# Patient Record
Sex: Female | Born: 1935 | Race: White | Hispanic: No | Smoking: Never smoker
Health system: Southern US, Community
[De-identification: ages and names within clinical notes are randomized; demographics above are authoritative.]

## PROBLEM LIST (undated history)

## (undated) DIAGNOSIS — N259 Disorder resulting from impaired renal tubular function, unspecified: Secondary | ICD-10-CM

## (undated) DIAGNOSIS — M810 Age-related osteoporosis without current pathological fracture: Secondary | ICD-10-CM

## (undated) DIAGNOSIS — R10816 Epigastric abdominal tenderness: Secondary | ICD-10-CM

## (undated) DIAGNOSIS — R55 Syncope and collapse: Secondary | ICD-10-CM

## (undated) DIAGNOSIS — H47029 Hemorrhage in optic nerve sheath, unspecified eye: Principal | ICD-10-CM

## (undated) DIAGNOSIS — M549 Dorsalgia, unspecified: Secondary | ICD-10-CM

## (undated) DIAGNOSIS — J309 Allergic rhinitis, unspecified: Secondary | ICD-10-CM

## (undated) DIAGNOSIS — R5383 Other fatigue: Secondary | ICD-10-CM

## (undated) DIAGNOSIS — I509 Heart failure, unspecified: Secondary | ICD-10-CM

## (undated) DIAGNOSIS — E785 Hyperlipidemia, unspecified: Secondary | ICD-10-CM

## (undated) DIAGNOSIS — E875 Hyperkalemia: Secondary | ICD-10-CM

## (undated) DIAGNOSIS — B0229 Other postherpetic nervous system involvement: Secondary | ICD-10-CM

## (undated) DIAGNOSIS — N200 Calculus of kidney: Secondary | ICD-10-CM

## (undated) DIAGNOSIS — J690 Pneumonitis due to inhalation of food and vomit: Secondary | ICD-10-CM

## (undated) DIAGNOSIS — H918X9 Other specified hearing loss, unspecified ear: Secondary | ICD-10-CM

## (undated) DIAGNOSIS — I219 Acute myocardial infarction, unspecified: Secondary | ICD-10-CM

## (undated) DIAGNOSIS — R1013 Epigastric pain: Secondary | ICD-10-CM

## (undated) DIAGNOSIS — M79672 Pain in left foot: Secondary | ICD-10-CM

## (undated) DIAGNOSIS — I10 Essential (primary) hypertension: Secondary | ICD-10-CM

## (undated) DIAGNOSIS — R5381 Other malaise: Secondary | ICD-10-CM

## (undated) DIAGNOSIS — K219 Gastro-esophageal reflux disease without esophagitis: Secondary | ICD-10-CM

## (undated) DIAGNOSIS — Z87898 Personal history of other specified conditions: Secondary | ICD-10-CM

## (undated) DIAGNOSIS — M1990 Unspecified osteoarthritis, unspecified site: Secondary | ICD-10-CM

## (undated) DIAGNOSIS — R300 Dysuria: Secondary | ICD-10-CM

## (undated) DIAGNOSIS — Z978 Presence of other specified devices: Secondary | ICD-10-CM

## (undated) DIAGNOSIS — I82409 Acute embolism and thrombosis of unspecified deep veins of unspecified lower extremity: Secondary | ICD-10-CM

## (undated) DIAGNOSIS — F329 Major depressive disorder, single episode, unspecified: Secondary | ICD-10-CM

## (undated) DIAGNOSIS — E119 Type 2 diabetes mellitus without complications: Secondary | ICD-10-CM

## (undated) DIAGNOSIS — D509 Iron deficiency anemia, unspecified: Secondary | ICD-10-CM

## (undated) DIAGNOSIS — N281 Cyst of kidney, acquired: Secondary | ICD-10-CM

## (undated) DIAGNOSIS — Z960 Presence of urogenital implants: Secondary | ICD-10-CM

## (undated) DIAGNOSIS — S32000A Wedge compression fracture of unspecified lumbar vertebra, initial encounter for closed fracture: Secondary | ICD-10-CM

## (undated) DIAGNOSIS — L989 Disorder of the skin and subcutaneous tissue, unspecified: Secondary | ICD-10-CM

## (undated) DIAGNOSIS — I5032 Chronic diastolic (congestive) heart failure: Secondary | ICD-10-CM

## (undated) DIAGNOSIS — K5900 Constipation, unspecified: Secondary | ICD-10-CM

## (undated) DIAGNOSIS — I951 Orthostatic hypotension: Secondary | ICD-10-CM

## (undated) DIAGNOSIS — E1140 Type 2 diabetes mellitus with diabetic neuropathy, unspecified: Secondary | ICD-10-CM

## (undated) DIAGNOSIS — Z8489 Family history of other specified conditions: Secondary | ICD-10-CM

## (undated) DIAGNOSIS — Z86718 Personal history of other venous thrombosis and embolism: Secondary | ICD-10-CM

## (undated) DIAGNOSIS — C4331 Malignant melanoma of nose: Secondary | ICD-10-CM

## (undated) DIAGNOSIS — S22000A Wedge compression fracture of unspecified thoracic vertebra, initial encounter for closed fracture: Secondary | ICD-10-CM

## (undated) DIAGNOSIS — E538 Deficiency of other specified B group vitamins: Secondary | ICD-10-CM

## (undated) DIAGNOSIS — I4891 Unspecified atrial fibrillation: Secondary | ICD-10-CM

## (undated) DIAGNOSIS — H01009 Unspecified blepharitis unspecified eye, unspecified eyelid: Secondary | ICD-10-CM

## (undated) HISTORY — DX: Age-related osteoporosis without current pathological fracture: M81.0

## (undated) HISTORY — DX: Pain in left foot: M79.672

## (undated) HISTORY — DX: Iron deficiency anemia, unspecified: D50.9

## (undated) HISTORY — DX: Gastro-esophageal reflux disease without esophagitis: K21.9

## (undated) HISTORY — DX: Unspecified blepharitis unspecified eye, unspecified eyelid: H01.009

## (undated) HISTORY — DX: Allergic rhinitis, unspecified: J30.9

## (undated) HISTORY — DX: Other malaise: R53.81

## (undated) HISTORY — DX: Syncope and collapse: R55

## (undated) HISTORY — DX: Personal history of other specified conditions: Z87.898

## (undated) HISTORY — DX: Epigastric abdominal tenderness: R10.816

## (undated) HISTORY — DX: Type 2 diabetes mellitus with diabetic neuropathy, unspecified: E11.40

## (undated) HISTORY — DX: Major depressive disorder, single episode, unspecified: F32.9

## (undated) HISTORY — DX: Type 2 diabetes mellitus without complications: E11.9

## (undated) HISTORY — DX: Constipation, unspecified: K59.00

## (undated) HISTORY — PX: CATARACT EXTRACTION W/ INTRAOCULAR LENS  IMPLANT, BILATERAL: SHX1307

## (undated) HISTORY — DX: Disorder resulting from impaired renal tubular function, unspecified: N25.9

## (undated) HISTORY — DX: Hyperlipidemia, unspecified: E78.5

## (undated) HISTORY — DX: Deficiency of other specified B group vitamins: E53.8

## (undated) HISTORY — DX: Chronic diastolic (congestive) heart failure: I50.32

## (undated) HISTORY — DX: Dorsalgia, unspecified: M54.9

## (undated) HISTORY — DX: Essential (primary) hypertension: I10

## (undated) HISTORY — PX: ESOPHAGEAL DILATION: SHX303

## (undated) HISTORY — DX: Dysuria: R30.0

## (undated) HISTORY — DX: Cyst of kidney, acquired: N28.1

## (undated) HISTORY — DX: Orthostatic hypotension: I95.1

## (undated) HISTORY — DX: Disorder of the skin and subcutaneous tissue, unspecified: L98.9

## (undated) HISTORY — DX: Hemorrhage in optic nerve sheath, unspecified eye: H47.029

## (undated) HISTORY — DX: Personal history of other venous thrombosis and embolism: Z86.718

## (undated) HISTORY — DX: Other fatigue: R53.83

## (undated) HISTORY — DX: Epigastric pain: R10.13

## (undated) HISTORY — DX: Unspecified atrial fibrillation: I48.91

## (undated) HISTORY — DX: Other postherpetic nervous system involvement: B02.29

## (undated) HISTORY — DX: Hyperkalemia: E87.5

## (undated) HISTORY — DX: Other specified hearing loss, unspecified ear: H91.8X9

---
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
## 1962-09-01 HISTORY — PX: ABDOMINAL HYSTERECTOMY: SHX81

## 1997-09-01 HISTORY — PX: CARDIAC ELECTROPHYSIOLOGY MAPPING AND ABLATION: SHX1292

## 1998-05-27 ENCOUNTER — Inpatient Hospital Stay (HOSPITAL_COMMUNITY): Admission: EM | Admit: 1998-05-27 | Discharge: 1998-05-28 | Payer: Self-pay | Admitting: Emergency Medicine

## 1998-06-13 ENCOUNTER — Inpatient Hospital Stay (HOSPITAL_COMMUNITY): Admission: AD | Admit: 1998-06-13 | Discharge: 1998-06-14 | Payer: Self-pay | Admitting: Internal Medicine

## 1998-06-15 ENCOUNTER — Inpatient Hospital Stay (HOSPITAL_COMMUNITY): Admission: EM | Admit: 1998-06-15 | Discharge: 1998-06-21 | Payer: Self-pay | Admitting: Emergency Medicine

## 1998-06-15 ENCOUNTER — Encounter: Payer: Self-pay | Admitting: Dermatology

## 1998-11-13 ENCOUNTER — Ambulatory Visit (HOSPITAL_COMMUNITY): Admission: RE | Admit: 1998-11-13 | Discharge: 1998-11-13 | Payer: Self-pay | Admitting: Internal Medicine

## 1998-11-13 ENCOUNTER — Encounter: Payer: Self-pay | Admitting: Internal Medicine

## 2001-06-02 ENCOUNTER — Encounter: Payer: Self-pay | Admitting: Gastroenterology

## 2001-06-02 ENCOUNTER — Ambulatory Visit (HOSPITAL_COMMUNITY): Admission: RE | Admit: 2001-06-02 | Discharge: 2001-06-02 | Payer: Self-pay | Admitting: Gastroenterology

## 2002-09-01 HISTORY — PX: CARPAL TUNNEL RELEASE: SHX101

## 2003-12-30 ENCOUNTER — Emergency Department (HOSPITAL_COMMUNITY): Admission: EM | Admit: 2003-12-30 | Discharge: 2003-12-30 | Payer: Self-pay | Admitting: Family Medicine

## 2004-07-15 ENCOUNTER — Ambulatory Visit: Payer: Self-pay | Admitting: Internal Medicine

## 2004-08-06 ENCOUNTER — Ambulatory Visit: Payer: Self-pay | Admitting: Internal Medicine

## 2004-09-04 ENCOUNTER — Encounter: Admission: RE | Admit: 2004-09-04 | Discharge: 2004-09-04 | Payer: Self-pay | Admitting: Orthopedic Surgery

## 2004-09-05 ENCOUNTER — Ambulatory Visit (HOSPITAL_COMMUNITY): Admission: RE | Admit: 2004-09-05 | Discharge: 2004-09-05 | Payer: Self-pay | Admitting: Orthopedic Surgery

## 2004-09-05 ENCOUNTER — Ambulatory Visit (HOSPITAL_BASED_OUTPATIENT_CLINIC_OR_DEPARTMENT_OTHER): Admission: RE | Admit: 2004-09-05 | Discharge: 2004-09-05 | Payer: Self-pay | Admitting: Orthopedic Surgery

## 2004-09-16 ENCOUNTER — Ambulatory Visit: Payer: Self-pay | Admitting: Internal Medicine

## 2004-10-16 ENCOUNTER — Ambulatory Visit: Payer: Self-pay | Admitting: Internal Medicine

## 2004-11-04 ENCOUNTER — Ambulatory Visit: Payer: Self-pay | Admitting: Internal Medicine

## 2004-11-12 ENCOUNTER — Ambulatory Visit: Payer: Self-pay | Admitting: Endocrinology

## 2004-11-15 ENCOUNTER — Ambulatory Visit (HOSPITAL_COMMUNITY): Admission: RE | Admit: 2004-11-15 | Discharge: 2004-11-15 | Payer: Self-pay | Admitting: Endocrinology

## 2004-11-15 ENCOUNTER — Ambulatory Visit: Payer: Self-pay | Admitting: Internal Medicine

## 2005-01-20 ENCOUNTER — Ambulatory Visit: Payer: Self-pay | Admitting: Internal Medicine

## 2005-02-03 ENCOUNTER — Ambulatory Visit: Payer: Self-pay | Admitting: Internal Medicine

## 2005-02-24 ENCOUNTER — Ambulatory Visit (HOSPITAL_COMMUNITY): Admission: RE | Admit: 2005-02-24 | Discharge: 2005-02-24 | Payer: Self-pay | Admitting: Internal Medicine

## 2005-02-24 ENCOUNTER — Ambulatory Visit: Payer: Self-pay | Admitting: Internal Medicine

## 2005-02-27 ENCOUNTER — Encounter (INDEPENDENT_AMBULATORY_CARE_PROVIDER_SITE_OTHER): Payer: Self-pay | Admitting: *Deleted

## 2005-02-27 ENCOUNTER — Other Ambulatory Visit: Admission: RE | Admit: 2005-02-27 | Discharge: 2005-02-27 | Payer: Self-pay | Admitting: Internal Medicine

## 2005-02-27 ENCOUNTER — Ambulatory Visit: Payer: Self-pay | Admitting: Internal Medicine

## 2005-03-06 ENCOUNTER — Ambulatory Visit: Payer: Self-pay | Admitting: Internal Medicine

## 2005-03-06 ENCOUNTER — Inpatient Hospital Stay (HOSPITAL_COMMUNITY): Admission: AD | Admit: 2005-03-06 | Discharge: 2005-03-10 | Payer: Self-pay | Admitting: Internal Medicine

## 2005-03-13 ENCOUNTER — Ambulatory Visit: Payer: Self-pay | Admitting: Internal Medicine

## 2005-03-18 ENCOUNTER — Inpatient Hospital Stay (HOSPITAL_COMMUNITY): Admission: AD | Admit: 2005-03-18 | Discharge: 2005-03-26 | Payer: Self-pay | Admitting: Internal Medicine

## 2005-03-18 ENCOUNTER — Ambulatory Visit: Payer: Self-pay | Admitting: Internal Medicine

## 2005-03-24 ENCOUNTER — Encounter: Payer: Self-pay | Admitting: Cardiology

## 2005-03-24 ENCOUNTER — Ambulatory Visit: Payer: Self-pay | Admitting: Internal Medicine

## 2005-04-04 ENCOUNTER — Ambulatory Visit: Payer: Self-pay | Admitting: Internal Medicine

## 2005-04-09 ENCOUNTER — Ambulatory Visit: Payer: Self-pay | Admitting: Internal Medicine

## 2005-04-21 ENCOUNTER — Ambulatory Visit: Payer: Self-pay | Admitting: Internal Medicine

## 2005-04-25 ENCOUNTER — Ambulatory Visit: Payer: Self-pay | Admitting: Internal Medicine

## 2005-05-02 ENCOUNTER — Ambulatory Visit: Payer: Self-pay | Admitting: Cardiology

## 2005-05-06 ENCOUNTER — Ambulatory Visit: Payer: Self-pay | Admitting: Internal Medicine

## 2005-05-07 ENCOUNTER — Ambulatory Visit: Payer: Self-pay | Admitting: Internal Medicine

## 2005-05-09 ENCOUNTER — Ambulatory Visit: Payer: Self-pay | Admitting: Cardiology

## 2005-05-11 ENCOUNTER — Encounter: Payer: Self-pay | Admitting: Family Medicine

## 2005-05-11 ENCOUNTER — Inpatient Hospital Stay (HOSPITAL_COMMUNITY): Admission: AD | Admit: 2005-05-11 | Discharge: 2005-05-30 | Payer: Self-pay | Admitting: Critical Care Medicine

## 2005-05-11 ENCOUNTER — Ambulatory Visit: Payer: Self-pay | Admitting: Critical Care Medicine

## 2005-05-12 ENCOUNTER — Encounter (INDEPENDENT_AMBULATORY_CARE_PROVIDER_SITE_OTHER): Payer: Self-pay | Admitting: *Deleted

## 2005-05-14 ENCOUNTER — Ambulatory Visit: Payer: Self-pay | Admitting: Gastroenterology

## 2005-05-15 ENCOUNTER — Ambulatory Visit: Payer: Self-pay | Admitting: Internal Medicine

## 2005-05-16 ENCOUNTER — Encounter (INDEPENDENT_AMBULATORY_CARE_PROVIDER_SITE_OTHER): Payer: Self-pay | Admitting: Specialist

## 2005-05-30 ENCOUNTER — Ambulatory Visit: Payer: Self-pay | Admitting: Internal Medicine

## 2005-06-03 ENCOUNTER — Ambulatory Visit: Payer: Self-pay | Admitting: Internal Medicine

## 2005-06-05 ENCOUNTER — Ambulatory Visit: Payer: Self-pay | Admitting: Internal Medicine

## 2005-06-17 ENCOUNTER — Ambulatory Visit: Payer: Self-pay | Admitting: Internal Medicine

## 2005-06-18 ENCOUNTER — Inpatient Hospital Stay (HOSPITAL_COMMUNITY): Admission: EM | Admit: 2005-06-18 | Discharge: 2005-07-01 | Payer: Self-pay | Admitting: Emergency Medicine

## 2005-06-18 ENCOUNTER — Ambulatory Visit: Payer: Self-pay | Admitting: Endocrinology

## 2005-06-30 ENCOUNTER — Ambulatory Visit: Payer: Self-pay | Admitting: Internal Medicine

## 2005-07-01 ENCOUNTER — Ambulatory Visit: Payer: Self-pay | Admitting: Internal Medicine

## 2005-07-01 ENCOUNTER — Inpatient Hospital Stay: Admission: RE | Admit: 2005-07-01 | Discharge: 2005-07-10 | Payer: Self-pay | Admitting: Internal Medicine

## 2005-07-14 ENCOUNTER — Ambulatory Visit: Payer: Self-pay | Admitting: Cardiology

## 2005-07-17 ENCOUNTER — Ambulatory Visit: Payer: Self-pay | Admitting: Internal Medicine

## 2005-07-21 ENCOUNTER — Ambulatory Visit: Payer: Self-pay | Admitting: Cardiology

## 2005-07-31 ENCOUNTER — Ambulatory Visit: Payer: Self-pay | Admitting: Cardiology

## 2005-08-01 ENCOUNTER — Ambulatory Visit: Payer: Self-pay | Admitting: Internal Medicine

## 2005-08-11 ENCOUNTER — Ambulatory Visit: Payer: Self-pay | Admitting: Cardiology

## 2005-08-14 ENCOUNTER — Ambulatory Visit: Payer: Self-pay | Admitting: Internal Medicine

## 2005-08-20 ENCOUNTER — Ambulatory Visit: Payer: Self-pay | Admitting: Internal Medicine

## 2005-08-28 ENCOUNTER — Ambulatory Visit: Payer: Self-pay | Admitting: *Deleted

## 2005-09-03 ENCOUNTER — Ambulatory Visit: Payer: Self-pay | Admitting: Internal Medicine

## 2005-09-06 ENCOUNTER — Emergency Department (HOSPITAL_COMMUNITY): Admission: EM | Admit: 2005-09-06 | Discharge: 2005-09-06 | Payer: Self-pay | Admitting: Family Medicine

## 2005-09-08 ENCOUNTER — Ambulatory Visit: Admission: RE | Admit: 2005-09-08 | Discharge: 2005-09-08 | Payer: Self-pay | Admitting: Internal Medicine

## 2005-09-08 ENCOUNTER — Ambulatory Visit (HOSPITAL_COMMUNITY): Admission: RE | Admit: 2005-09-08 | Discharge: 2005-09-08 | Payer: Self-pay | Admitting: Internal Medicine

## 2005-09-10 ENCOUNTER — Ambulatory Visit: Payer: Self-pay | Admitting: *Deleted

## 2005-09-15 ENCOUNTER — Ambulatory Visit: Payer: Self-pay | Admitting: Internal Medicine

## 2005-09-29 ENCOUNTER — Ambulatory Visit: Payer: Self-pay | Admitting: Cardiology

## 2005-10-06 ENCOUNTER — Ambulatory Visit: Payer: Self-pay | Admitting: Cardiology

## 2005-10-14 ENCOUNTER — Ambulatory Visit: Payer: Self-pay | Admitting: *Deleted

## 2005-10-21 ENCOUNTER — Ambulatory Visit: Payer: Self-pay | Admitting: Cardiovascular Disease

## 2005-10-29 ENCOUNTER — Ambulatory Visit: Payer: Self-pay | Admitting: Cardiology

## 2005-11-12 ENCOUNTER — Ambulatory Visit: Payer: Self-pay | Admitting: Cardiology

## 2005-11-21 ENCOUNTER — Ambulatory Visit: Payer: Self-pay | Admitting: Internal Medicine

## 2005-11-28 ENCOUNTER — Ambulatory Visit: Payer: Self-pay | Admitting: Cardiology

## 2005-12-12 ENCOUNTER — Ambulatory Visit: Payer: Self-pay | Admitting: Internal Medicine

## 2005-12-22 ENCOUNTER — Ambulatory Visit: Payer: Self-pay | Admitting: Internal Medicine

## 2005-12-26 ENCOUNTER — Ambulatory Visit: Payer: Self-pay | Admitting: Cardiology

## 2006-01-02 ENCOUNTER — Ambulatory Visit: Payer: Self-pay | Admitting: Internal Medicine

## 2006-01-19 ENCOUNTER — Ambulatory Visit: Payer: Self-pay | Admitting: Internal Medicine

## 2006-02-13 ENCOUNTER — Ambulatory Visit: Payer: Self-pay | Admitting: Cardiology

## 2006-02-23 ENCOUNTER — Ambulatory Visit: Payer: Self-pay | Admitting: Internal Medicine

## 2006-03-09 ENCOUNTER — Ambulatory Visit: Payer: Self-pay | Admitting: Internal Medicine

## 2006-03-23 ENCOUNTER — Ambulatory Visit: Payer: Self-pay | Admitting: Internal Medicine

## 2006-04-07 ENCOUNTER — Ambulatory Visit: Payer: Self-pay | Admitting: Cardiology

## 2006-04-27 ENCOUNTER — Ambulatory Visit: Payer: Self-pay | Admitting: Internal Medicine

## 2006-05-06 ENCOUNTER — Ambulatory Visit: Payer: Self-pay | Admitting: Cardiology

## 2006-05-20 ENCOUNTER — Ambulatory Visit: Payer: Self-pay | Admitting: Cardiology

## 2006-05-25 ENCOUNTER — Ambulatory Visit: Payer: Self-pay | Admitting: Internal Medicine

## 2006-05-27 ENCOUNTER — Ambulatory Visit: Payer: Self-pay | Admitting: Internal Medicine

## 2006-06-16 ENCOUNTER — Ambulatory Visit: Payer: Self-pay | Admitting: Cardiology

## 2006-06-23 ENCOUNTER — Ambulatory Visit: Payer: Self-pay | Admitting: Family Medicine

## 2006-06-23 ENCOUNTER — Ambulatory Visit: Payer: Self-pay | Admitting: Internal Medicine

## 2006-06-30 ENCOUNTER — Ambulatory Visit: Payer: Self-pay | Admitting: Cardiology

## 2006-07-15 ENCOUNTER — Ambulatory Visit: Payer: Self-pay | Admitting: Cardiology

## 2006-07-27 ENCOUNTER — Ambulatory Visit: Payer: Self-pay | Admitting: Internal Medicine

## 2006-07-27 ENCOUNTER — Ambulatory Visit: Payer: Self-pay | Admitting: Cardiology

## 2006-08-12 ENCOUNTER — Ambulatory Visit: Payer: Self-pay | Admitting: Internal Medicine

## 2006-08-26 ENCOUNTER — Ambulatory Visit: Payer: Self-pay | Admitting: Internal Medicine

## 2006-09-03 ENCOUNTER — Ambulatory Visit: Payer: Self-pay | Admitting: Cardiology

## 2006-09-24 ENCOUNTER — Ambulatory Visit: Payer: Self-pay | Admitting: Cardiology

## 2006-09-28 ENCOUNTER — Ambulatory Visit: Payer: Self-pay | Admitting: Internal Medicine

## 2006-10-08 ENCOUNTER — Ambulatory Visit: Payer: Self-pay | Admitting: Cardiology

## 2006-10-27 ENCOUNTER — Ambulatory Visit: Payer: Self-pay | Admitting: Cardiology

## 2006-11-17 ENCOUNTER — Ambulatory Visit: Payer: Self-pay | Admitting: Cardiology

## 2006-11-25 ENCOUNTER — Ambulatory Visit: Payer: Self-pay | Admitting: Internal Medicine

## 2006-11-25 LAB — CONVERTED CEMR LAB
Albumin: 3.6 g/dL (ref 3.5–5.2)
Basophils Absolute: 0 10*3/uL (ref 0.0–0.1)
Cholesterol: 112 mg/dL (ref 0–200)
Creatinine, Ser: 1.1 mg/dL (ref 0.4–1.2)
Glucose, Bld: 115 mg/dL — ABNORMAL HIGH (ref 70–99)
HDL: 37.2 mg/dL — ABNORMAL LOW (ref >39.0)
Hemoglobin: 13.6 g/dL (ref 12.0–15.0)
LDL Cholesterol: 61 mg/dL (ref 0–99)
Lymphocytes Relative: 17.1 % (ref 12.0–46.0)
MCHC: 34.1 g/dL (ref 30.0–36.0)
Monocytes Absolute: 0.5 10*3/uL (ref 0.2–0.7)
Monocytes Relative: 8.7 % (ref 3.0–11.0)
Neutrophils Relative %: 72.8 % (ref 43.0–77.0)
Platelets: 182 10*3/uL (ref 150–400)
RBC: 4.74 M/uL (ref 3.87–5.11)
Total Bilirubin: 0.6 mg/dL (ref 0.3–1.2)
Total CHOL/HDL Ratio: 3
Triglycerides: 67 mg/dL (ref 0–149)
VLDL: 13 mg/dL (ref 0–40)
WBC: 6 10*3/uL (ref 4.5–10.5)

## 2006-12-15 ENCOUNTER — Ambulatory Visit: Payer: Self-pay | Admitting: Cardiovascular Disease

## 2006-12-18 ENCOUNTER — Emergency Department (HOSPITAL_COMMUNITY): Admission: EM | Admit: 2006-12-18 | Discharge: 2006-12-18 | Payer: Self-pay | Admitting: Family Medicine

## 2007-01-05 ENCOUNTER — Ambulatory Visit: Payer: Self-pay | Admitting: Internal Medicine

## 2007-01-11 ENCOUNTER — Ambulatory Visit: Payer: Self-pay | Admitting: Cardiology

## 2007-01-22 ENCOUNTER — Ambulatory Visit: Payer: Self-pay | Admitting: Cardiovascular Disease

## 2007-01-26 DIAGNOSIS — I10 Essential (primary) hypertension: Secondary | ICD-10-CM

## 2007-01-26 DIAGNOSIS — D509 Iron deficiency anemia, unspecified: Secondary | ICD-10-CM | POA: Insufficient documentation

## 2007-01-26 DIAGNOSIS — K219 Gastro-esophageal reflux disease without esophagitis: Secondary | ICD-10-CM

## 2007-01-26 HISTORY — DX: Essential (primary) hypertension: I10

## 2007-01-26 HISTORY — DX: Gastro-esophageal reflux disease without esophagitis: K21.9

## 2007-01-26 HISTORY — DX: Iron deficiency anemia, unspecified: D50.9

## 2007-01-27 ENCOUNTER — Ambulatory Visit: Payer: Self-pay | Admitting: Internal Medicine

## 2007-01-28 LAB — HM MAMMOGRAPHY

## 2007-02-10 ENCOUNTER — Ambulatory Visit: Payer: Self-pay | Admitting: Cardiology

## 2007-03-01 ENCOUNTER — Ambulatory Visit: Payer: Self-pay | Admitting: Internal Medicine

## 2007-03-10 ENCOUNTER — Ambulatory Visit: Payer: Self-pay | Admitting: Cardiology

## 2007-04-06 ENCOUNTER — Ambulatory Visit: Payer: Self-pay | Admitting: Cardiology

## 2007-04-06 ENCOUNTER — Ambulatory Visit: Payer: Self-pay | Admitting: Internal Medicine

## 2007-04-06 LAB — CONVERTED CEMR LAB
AST: 18 units/L (ref 0–37)
Bilirubin, Direct: 0.2 mg/dL (ref 0.0–0.3)
CO2: 30 meq/L (ref 19–32)
Calcium: 9.6 mg/dL (ref 8.4–10.5)
Chloride: 106 meq/L (ref 96–112)
Creatinine,U: 44.1 mg/dL
GFR calc Af Amer: 68 mL/min
GFR calc non Af Amer: 56 mL/min
Glucose, Bld: 106 mg/dL — ABNORMAL HIGH (ref 70–99)
HCT: 37.3 % (ref 36.0–46.0)
Hemoglobin: 12.9 g/dL (ref 12.0–15.0)
Hgb A1c MFr Bld: 6.6 % — ABNORMAL HIGH (ref 4.6–6.0)
MCV: 84.5 fL (ref 78.0–100.0)
Microalb Creat Ratio: 22.7 mg/g (ref 0.0–30.0)
Monocytes Absolute: 0.5 10*3/uL (ref 0.2–0.7)
Monocytes Relative: 8.6 % (ref 3.0–11.0)
Neutro Abs: 4.2 10*3/uL (ref 1.4–7.7)
Potassium: 4.5 meq/L (ref 3.5–5.1)
RBC: 4.42 M/uL (ref 3.87–5.11)
RDW: 14.2 % (ref 11.5–14.6)
Specific Gravity, Urine: 1.01 (ref 1.000–1.03)
Total Bilirubin: 0.8 mg/dL (ref 0.3–1.2)
Total CHOL/HDL Ratio: 3.1
Total Protein, Urine: NEGATIVE mg/dL
Total Protein: 6.4 g/dL (ref 6.0–8.3)
Triglycerides: 79 mg/dL (ref 0–149)
Urine Glucose: NEGATIVE mg/dL
VLDL: 16 mg/dL (ref 0–40)
WBC: 6.2 10*3/uL (ref 4.5–10.5)
pH: 6 (ref 5.0–8.0)

## 2007-04-22 ENCOUNTER — Ambulatory Visit: Payer: Self-pay | Admitting: Cardiology

## 2007-04-29 ENCOUNTER — Ambulatory Visit (HOSPITAL_COMMUNITY): Admission: RE | Admit: 2007-04-29 | Discharge: 2007-04-29 | Payer: Self-pay | Admitting: Internal Medicine

## 2007-05-04 ENCOUNTER — Ambulatory Visit: Payer: Self-pay | Admitting: Internal Medicine

## 2007-05-09 IMAGING — CR DG CHEST 1V PORT
1 series · 1 of 1 positions shown · non-contrast
Comparison: Yesterday.

CLINICAL DATA: Followup left pneumothorax

PORTABLE CHEST - 1 VIEW

[view not recorded]
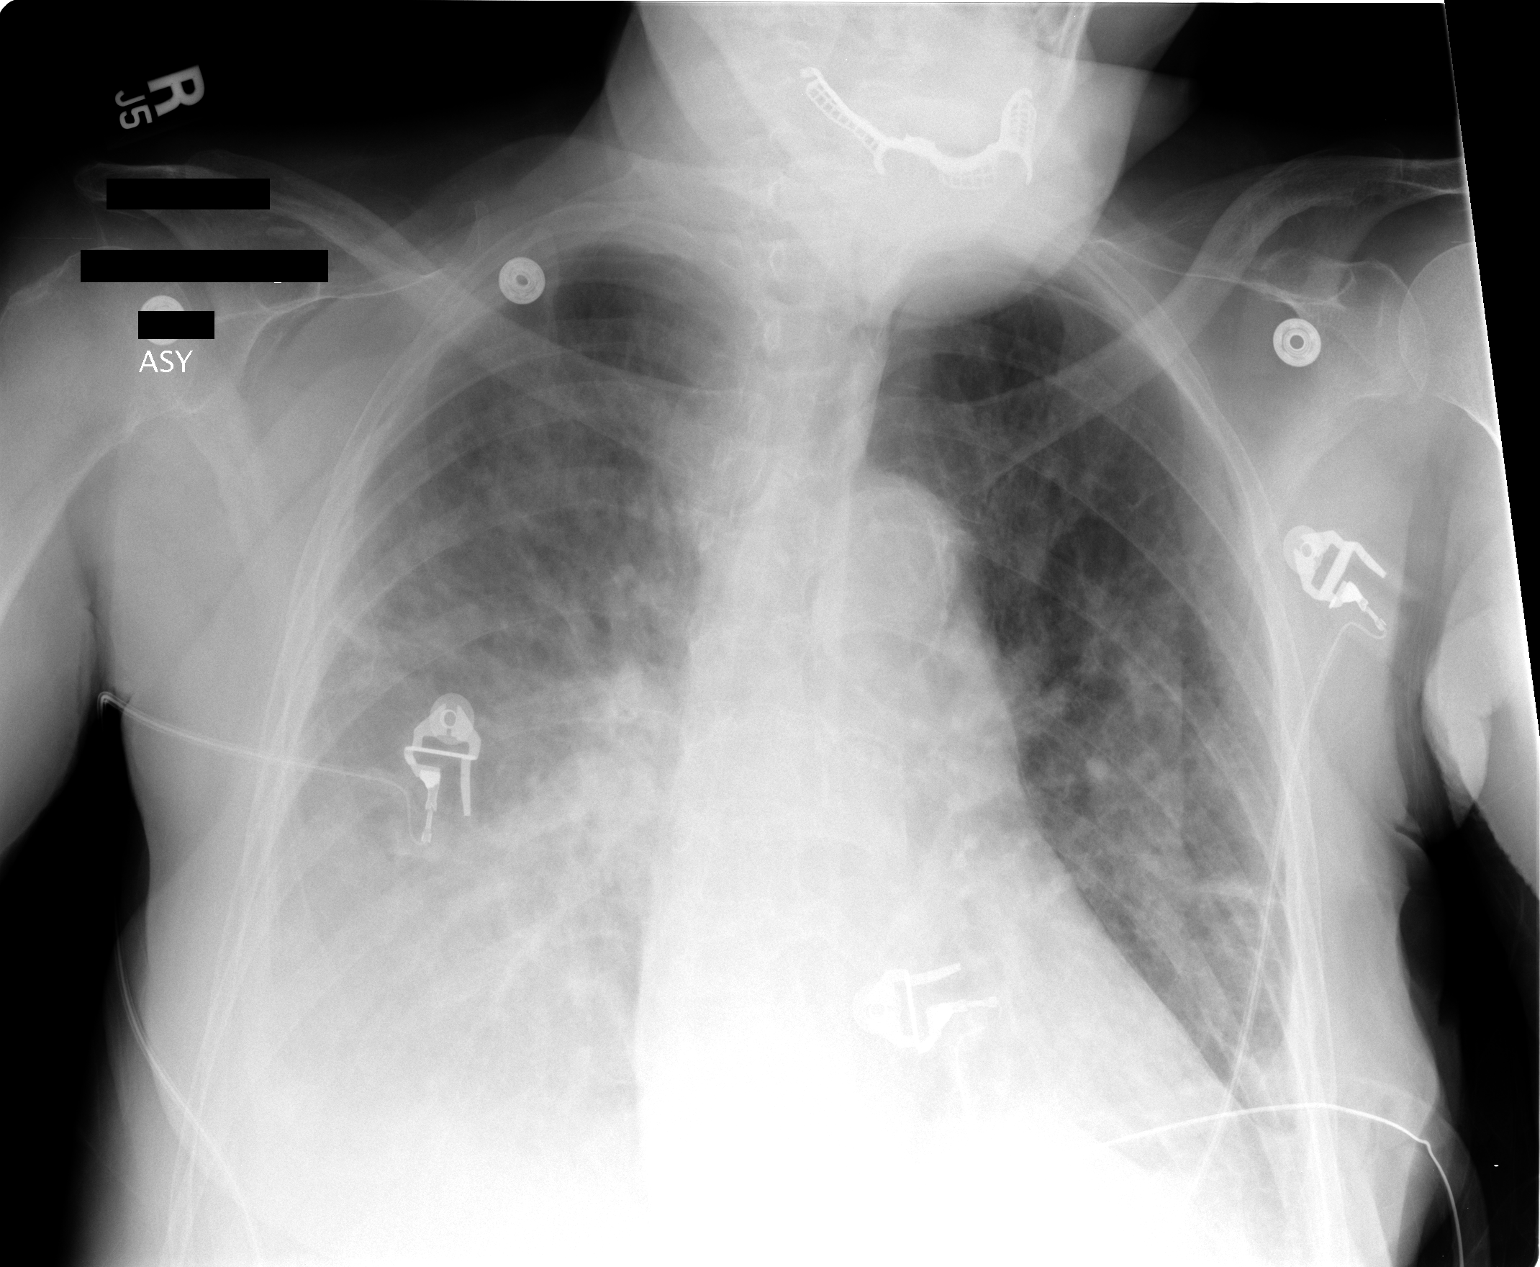

[1 of 1 positions shown; findings below may reference images not displayed]

FINDINGS: No significant change in the 5% left apical pneumothorax. Mildly
increased prominence of the pulmonary vasculature and interstitial markings.
Increased right pleural fluid. Mildly enlarged cardiac silhouette with
improvement.  

IMPRESSION

1. Stable 5% left apical pneumothorax.
2. Worsening congestive heart failure.

## 2007-05-11 ENCOUNTER — Ambulatory Visit: Payer: Self-pay | Admitting: Internal Medicine

## 2007-05-14 IMAGING — CR DG CHEST 2V
2 series · 2 of 2 positions shown · non-contrast
Comparison: 05/27/05.

CLINICAL DATA: Status-post chest tube removal. 
 2-VIEW CHEST:

[view not recorded (1 of 2)]
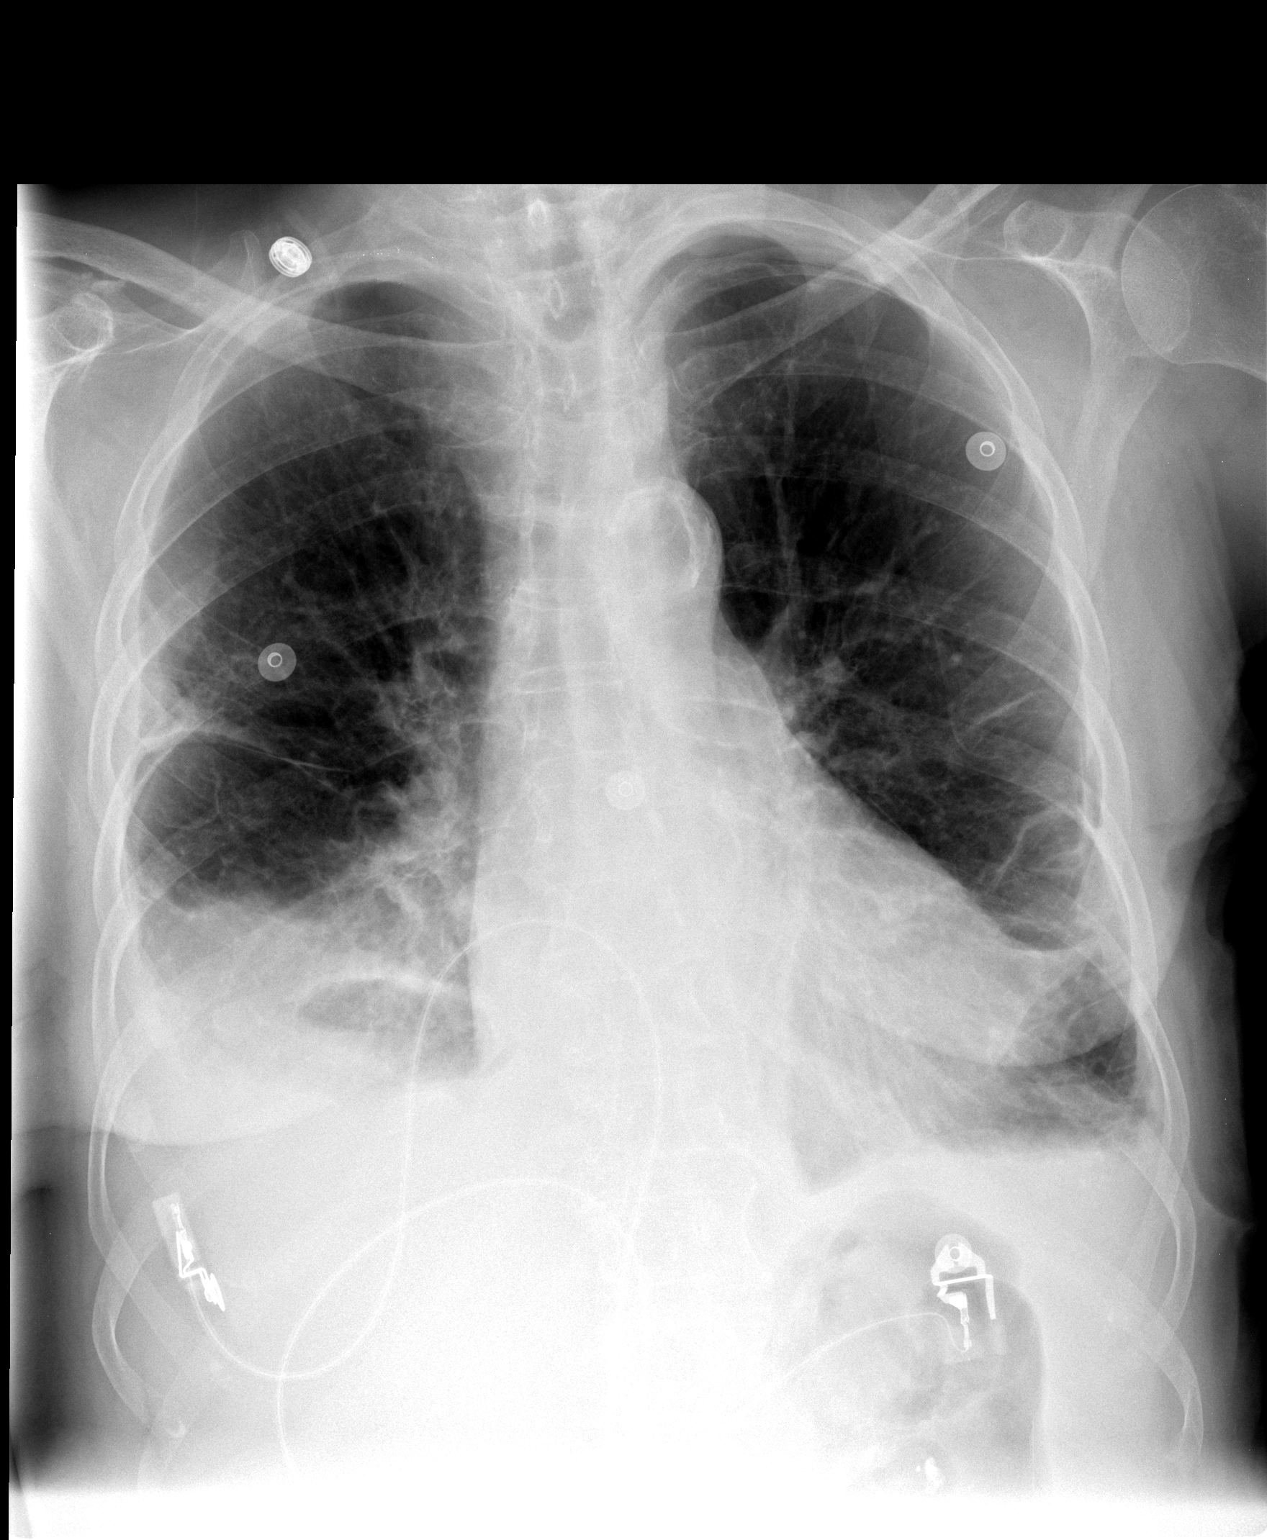

[view not recorded (2 of 2)]
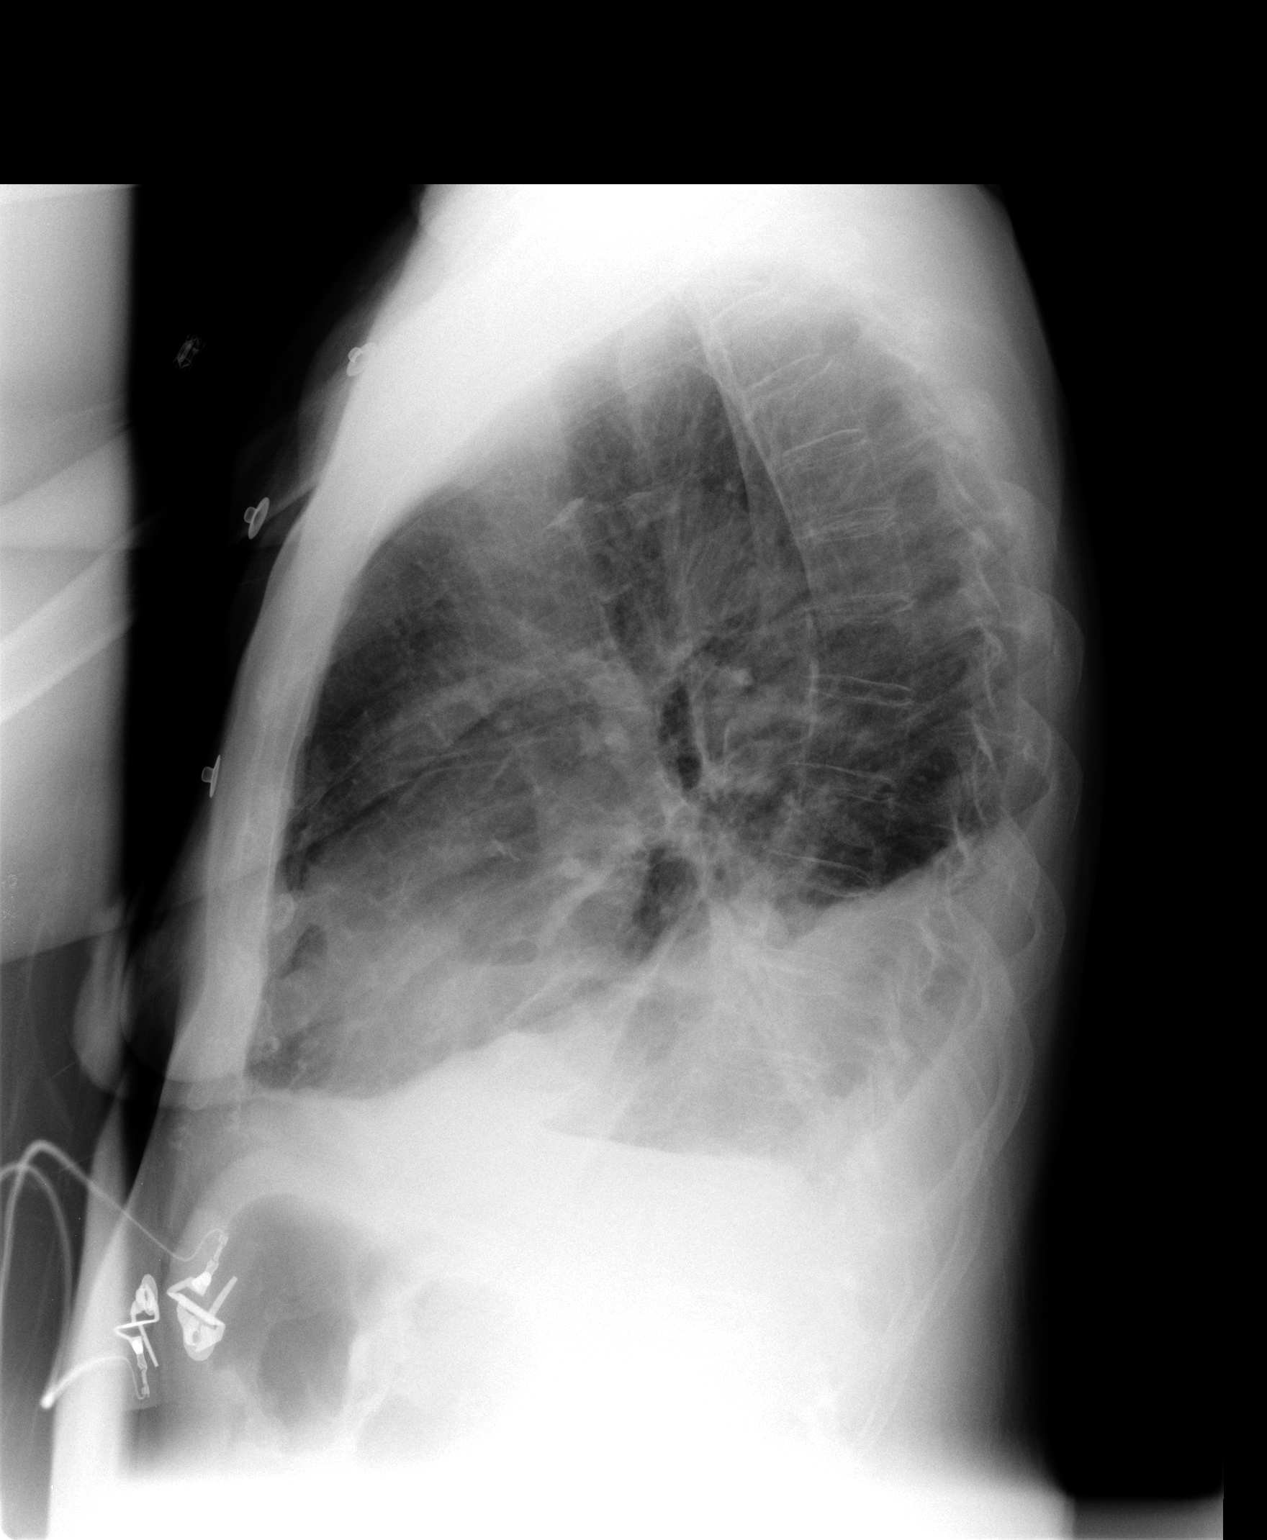

[2 of 2 positions shown; findings below may reference images not displayed]

FINDINGS: Stable cardiomegaly.  Right effusion is unchanged.  Bibasilar atelectasis persists.  
 Left pneumothorax is still visible but is slightly decreased in the interval.
IMPRESSION: 1.  Unchanged CHF and bibasilar atelectasis.
 2.  Slight interval decrease in small left pneumothorax.

## 2007-05-23 ENCOUNTER — Encounter: Payer: Self-pay | Admitting: Internal Medicine

## 2007-05-23 DIAGNOSIS — E538 Deficiency of other specified B group vitamins: Secondary | ICD-10-CM

## 2007-05-23 DIAGNOSIS — I4891 Unspecified atrial fibrillation: Secondary | ICD-10-CM

## 2007-05-23 DIAGNOSIS — Z86718 Personal history of other venous thrombosis and embolism: Secondary | ICD-10-CM | POA: Insufficient documentation

## 2007-05-23 DIAGNOSIS — M810 Age-related osteoporosis without current pathological fracture: Secondary | ICD-10-CM | POA: Insufficient documentation

## 2007-05-23 DIAGNOSIS — E785 Hyperlipidemia, unspecified: Secondary | ICD-10-CM

## 2007-05-23 DIAGNOSIS — Z87898 Personal history of other specified conditions: Secondary | ICD-10-CM

## 2007-05-23 HISTORY — DX: Unspecified atrial fibrillation: I48.91

## 2007-05-23 HISTORY — DX: Age-related osteoporosis without current pathological fracture: M81.0

## 2007-05-23 HISTORY — DX: Deficiency of other specified B group vitamins: E53.8

## 2007-05-23 HISTORY — DX: Personal history of other specified conditions: Z87.898

## 2007-05-23 HISTORY — DX: Personal history of other venous thrombosis and embolism: Z86.718

## 2007-05-23 HISTORY — DX: Hyperlipidemia, unspecified: E78.5

## 2007-06-01 ENCOUNTER — Ambulatory Visit: Payer: Self-pay | Admitting: Cardiology

## 2007-06-07 ENCOUNTER — Ambulatory Visit: Payer: Self-pay | Admitting: Internal Medicine

## 2007-06-22 ENCOUNTER — Ambulatory Visit: Payer: Self-pay | Admitting: Cardiovascular Disease

## 2007-07-13 ENCOUNTER — Ambulatory Visit: Payer: Self-pay | Admitting: Internal Medicine

## 2007-07-16 ENCOUNTER — Ambulatory Visit: Payer: Self-pay | Admitting: Internal Medicine

## 2007-07-16 ENCOUNTER — Ambulatory Visit: Payer: Self-pay | Admitting: Cardiology

## 2007-07-26 ENCOUNTER — Telehealth (INDEPENDENT_AMBULATORY_CARE_PROVIDER_SITE_OTHER): Payer: Self-pay | Admitting: *Deleted

## 2007-08-02 ENCOUNTER — Ambulatory Visit: Payer: Self-pay | Admitting: Internal Medicine

## 2007-08-02 DIAGNOSIS — M549 Dorsalgia, unspecified: Secondary | ICD-10-CM | POA: Insufficient documentation

## 2007-08-02 HISTORY — DX: Dorsalgia, unspecified: M54.9

## 2007-08-11 ENCOUNTER — Ambulatory Visit: Payer: Self-pay | Admitting: Internal Medicine

## 2007-08-16 ENCOUNTER — Ambulatory Visit: Payer: Self-pay | Admitting: Internal Medicine

## 2007-08-31 ENCOUNTER — Ambulatory Visit: Payer: Self-pay | Admitting: Internal Medicine

## 2007-09-06 ENCOUNTER — Ambulatory Visit: Payer: Self-pay | Admitting: Internal Medicine

## 2007-09-06 DIAGNOSIS — R5381 Other malaise: Secondary | ICD-10-CM

## 2007-09-06 DIAGNOSIS — N281 Cyst of kidney, acquired: Secondary | ICD-10-CM

## 2007-09-06 DIAGNOSIS — R1013 Epigastric pain: Secondary | ICD-10-CM

## 2007-09-06 DIAGNOSIS — E119 Type 2 diabetes mellitus without complications: Secondary | ICD-10-CM | POA: Insufficient documentation

## 2007-09-06 DIAGNOSIS — R5383 Other fatigue: Secondary | ICD-10-CM

## 2007-09-06 HISTORY — DX: Type 2 diabetes mellitus without complications: E11.9

## 2007-09-06 HISTORY — DX: Other malaise: R53.81

## 2007-09-06 HISTORY — DX: Epigastric pain: R10.13

## 2007-09-06 HISTORY — DX: Cyst of kidney, acquired: N28.1

## 2007-09-06 LAB — CONVERTED CEMR LAB
ALT: 19 units/L (ref 0–35)
AST: 16 units/L (ref 0–37)
BUN: 29 mg/dL — ABNORMAL HIGH (ref 6–23)
Basophils Absolute: 0.1 10*3/uL (ref 0.0–0.1)
Bilirubin, Direct: 0.1 mg/dL (ref 0.0–0.3)
Chloride: 104 meq/L (ref 96–112)
Eosinophils Absolute: 0.1 10*3/uL (ref 0.0–0.6)
Eosinophils Relative: 1.3 % (ref 0.0–5.0)
GFR calc non Af Amer: 41 mL/min
Glucose, Bld: 134 mg/dL — ABNORMAL HIGH (ref 70–99)
Lymphocytes Relative: 21.1 % (ref 12.0–46.0)
Platelets: 174 10*3/uL (ref 150–400)
Total CHOL/HDL Ratio: 4.7

## 2007-09-07 LAB — CONVERTED CEMR LAB
Bilirubin Urine: NEGATIVE
RBC / HPF: NONE SEEN (ref <3)
Specific Gravity, Urine: 1.01 (ref 1.005–1.03)
Urine Glucose: NEGATIVE mg/dL
pH: 5.5 (ref 5.0–8.0)

## 2007-09-14 ENCOUNTER — Encounter: Payer: Self-pay | Admitting: Internal Medicine

## 2007-09-16 ENCOUNTER — Encounter: Admission: RE | Admit: 2007-09-16 | Discharge: 2007-09-16 | Payer: Self-pay | Admitting: Internal Medicine

## 2007-09-24 ENCOUNTER — Ambulatory Visit: Payer: Self-pay | Admitting: Cardiology

## 2007-10-12 ENCOUNTER — Ambulatory Visit: Payer: Self-pay | Admitting: Internal Medicine

## 2007-10-12 DIAGNOSIS — N390 Urinary tract infection, site not specified: Secondary | ICD-10-CM

## 2007-10-12 DIAGNOSIS — R10816 Epigastric abdominal tenderness: Secondary | ICD-10-CM

## 2007-10-12 HISTORY — DX: Epigastric abdominal tenderness: R10.816

## 2007-10-26 ENCOUNTER — Ambulatory Visit: Payer: Self-pay | Admitting: Internal Medicine

## 2007-10-27 ENCOUNTER — Telehealth: Payer: Self-pay | Admitting: Internal Medicine

## 2007-11-03 ENCOUNTER — Encounter: Payer: Self-pay | Admitting: Internal Medicine

## 2007-11-04 ENCOUNTER — Ambulatory Visit: Payer: Self-pay | Admitting: Internal Medicine

## 2007-11-20 ENCOUNTER — Emergency Department (HOSPITAL_COMMUNITY): Admission: EM | Admit: 2007-11-20 | Discharge: 2007-11-20 | Payer: Self-pay | Admitting: Emergency Medicine

## 2007-11-22 ENCOUNTER — Ambulatory Visit: Payer: Self-pay | Admitting: Cardiology

## 2007-12-08 ENCOUNTER — Ambulatory Visit: Payer: Self-pay | Admitting: Internal Medicine

## 2007-12-08 ENCOUNTER — Ambulatory Visit: Payer: Self-pay | Admitting: Cardiovascular Disease

## 2007-12-08 LAB — CONVERTED CEMR LAB
Bilirubin Urine: NEGATIVE
pH: 6.5

## 2007-12-16 ENCOUNTER — Ambulatory Visit: Payer: Self-pay | Admitting: Cardiology

## 2007-12-21 ENCOUNTER — Encounter: Payer: Self-pay | Admitting: Internal Medicine

## 2007-12-27 ENCOUNTER — Ambulatory Visit: Payer: Self-pay | Admitting: Cardiology

## 2008-01-03 ENCOUNTER — Ambulatory Visit: Payer: Self-pay | Admitting: Internal Medicine

## 2008-01-06 ENCOUNTER — Ambulatory Visit: Payer: Self-pay | Admitting: Internal Medicine

## 2008-01-20 ENCOUNTER — Ambulatory Visit: Payer: Self-pay | Admitting: Internal Medicine

## 2008-01-25 ENCOUNTER — Ambulatory Visit: Payer: Self-pay | Admitting: Internal Medicine

## 2008-01-25 DIAGNOSIS — F3289 Other specified depressive episodes: Secondary | ICD-10-CM

## 2008-01-25 DIAGNOSIS — K5900 Constipation, unspecified: Secondary | ICD-10-CM | POA: Insufficient documentation

## 2008-01-25 DIAGNOSIS — F329 Major depressive disorder, single episode, unspecified: Secondary | ICD-10-CM

## 2008-01-25 HISTORY — DX: Constipation, unspecified: K59.00

## 2008-01-25 HISTORY — DX: Major depressive disorder, single episode, unspecified: F32.9

## 2008-01-25 HISTORY — DX: Other specified depressive episodes: F32.89

## 2008-01-27 ENCOUNTER — Inpatient Hospital Stay (HOSPITAL_COMMUNITY): Admission: EM | Admit: 2008-01-27 | Discharge: 2008-02-01 | Payer: Self-pay | Admitting: Emergency Medicine

## 2008-01-27 ENCOUNTER — Ambulatory Visit: Payer: Self-pay | Admitting: Cardiovascular Disease

## 2008-01-31 ENCOUNTER — Ambulatory Visit: Payer: Self-pay | Admitting: Internal Medicine

## 2008-02-07 ENCOUNTER — Ambulatory Visit: Payer: Self-pay | Admitting: Internal Medicine

## 2008-02-07 DIAGNOSIS — R55 Syncope and collapse: Secondary | ICD-10-CM

## 2008-02-07 DIAGNOSIS — I951 Orthostatic hypotension: Secondary | ICD-10-CM

## 2008-02-07 HISTORY — DX: Syncope and collapse: R55

## 2008-02-07 HISTORY — DX: Orthostatic hypotension: I95.1

## 2008-02-08 ENCOUNTER — Encounter: Payer: Self-pay | Admitting: Internal Medicine

## 2008-02-10 ENCOUNTER — Ambulatory Visit: Payer: Self-pay | Admitting: Cardiology

## 2008-02-16 ENCOUNTER — Ambulatory Visit: Payer: Self-pay | Admitting: Internal Medicine

## 2008-02-22 ENCOUNTER — Ambulatory Visit: Payer: Self-pay | Admitting: Cardiovascular Disease

## 2008-02-23 ENCOUNTER — Ambulatory Visit: Payer: Self-pay | Admitting: Internal Medicine

## 2008-02-23 DIAGNOSIS — H01009 Unspecified blepharitis unspecified eye, unspecified eyelid: Secondary | ICD-10-CM | POA: Insufficient documentation

## 2008-02-23 HISTORY — DX: Unspecified blepharitis unspecified eye, unspecified eyelid: H01.009

## 2008-03-08 ENCOUNTER — Ambulatory Visit: Payer: Self-pay | Admitting: Internal Medicine

## 2008-03-08 ENCOUNTER — Encounter: Payer: Self-pay | Admitting: Internal Medicine

## 2008-03-13 ENCOUNTER — Telehealth: Payer: Self-pay | Admitting: Internal Medicine

## 2008-03-13 ENCOUNTER — Ambulatory Visit: Payer: Self-pay | Admitting: Internal Medicine

## 2008-03-13 ENCOUNTER — Inpatient Hospital Stay (HOSPITAL_COMMUNITY): Admission: EM | Admit: 2008-03-13 | Discharge: 2008-03-23 | Payer: Self-pay | Admitting: Emergency Medicine

## 2008-03-28 ENCOUNTER — Ambulatory Visit: Payer: Self-pay | Admitting: Cardiology

## 2008-04-06 ENCOUNTER — Ambulatory Visit: Payer: Self-pay | Admitting: Internal Medicine

## 2008-04-07 LAB — CONVERTED CEMR LAB
CO2: 20 meq/L (ref 19–32)
Calcium: 9.8 mg/dL (ref 8.4–10.5)
Chloride: 105 meq/L (ref 96–112)
Creatinine, Ser: 1.4 mg/dL — ABNORMAL HIGH (ref 0.4–1.2)
Glucose, Bld: 263 mg/dL — ABNORMAL HIGH (ref 70–99)

## 2008-04-11 ENCOUNTER — Ambulatory Visit: Payer: Self-pay | Admitting: Internal Medicine

## 2008-04-13 ENCOUNTER — Encounter: Payer: Self-pay | Admitting: Internal Medicine

## 2008-04-19 ENCOUNTER — Telehealth: Payer: Self-pay | Admitting: Internal Medicine

## 2008-04-24 ENCOUNTER — Ambulatory Visit: Payer: Self-pay | Admitting: Internal Medicine

## 2008-04-24 ENCOUNTER — Ambulatory Visit: Payer: Self-pay | Admitting: Cardiovascular Disease

## 2008-04-25 ENCOUNTER — Telehealth: Payer: Self-pay | Admitting: Internal Medicine

## 2008-04-25 LAB — CONVERTED CEMR LAB
CO2: 23 meq/L (ref 19–32)
Potassium: 6.1 meq/L (ref 3.5–5.1)
Sodium: 139 meq/L (ref 135–145)

## 2008-05-16 ENCOUNTER — Ambulatory Visit: Payer: Self-pay | Admitting: Cardiovascular Disease

## 2008-05-31 ENCOUNTER — Ambulatory Visit: Payer: Self-pay | Admitting: Internal Medicine

## 2008-06-14 ENCOUNTER — Ambulatory Visit: Payer: Self-pay | Admitting: Internal Medicine

## 2008-06-14 ENCOUNTER — Encounter: Payer: Self-pay | Admitting: Internal Medicine

## 2008-06-21 ENCOUNTER — Ambulatory Visit: Payer: Self-pay | Admitting: Internal Medicine

## 2008-06-21 LAB — CONVERTED CEMR LAB
Bilirubin Urine: NEGATIVE
Glucose, Urine, Semiquant: NEGATIVE
Specific Gravity, Urine: 1.01
pH: 6

## 2008-06-23 ENCOUNTER — Emergency Department (HOSPITAL_COMMUNITY): Admission: EM | Admit: 2008-06-23 | Discharge: 2008-06-23 | Payer: Self-pay | Admitting: Emergency Medicine

## 2008-06-23 ENCOUNTER — Telehealth: Payer: Self-pay | Admitting: Internal Medicine

## 2008-07-05 ENCOUNTER — Ambulatory Visit: Payer: Self-pay | Admitting: Cardiology

## 2008-07-15 ENCOUNTER — Emergency Department (HOSPITAL_COMMUNITY): Admission: EM | Admit: 2008-07-15 | Discharge: 2008-07-15 | Payer: Self-pay | Admitting: Emergency Medicine

## 2008-07-20 ENCOUNTER — Ambulatory Visit: Payer: Self-pay | Admitting: Internal Medicine

## 2008-07-20 DIAGNOSIS — S0180XA Unspecified open wound of other part of head, initial encounter: Secondary | ICD-10-CM | POA: Insufficient documentation

## 2008-07-20 LAB — CONVERTED CEMR LAB
BUN: 22 mg/dL (ref 6–23)
CO2: 22 meq/L (ref 19–32)
Chloride: 106 meq/L (ref 96–112)
Eosinophils Relative: 3.6 % (ref 0.0–5.0)
Glucose, Bld: 148 mg/dL — ABNORMAL HIGH (ref 70–99)
Lymphocytes Relative: 18.5 % (ref 12.0–46.0)
Monocytes Relative: 9.3 % (ref 3.0–12.0)
Platelets: 177 10*3/uL (ref 150–400)
Potassium: 5.4 meq/L — ABNORMAL HIGH (ref 3.5–5.1)
RDW: 13.5 % (ref 11.5–14.6)
Sodium: 136 meq/L (ref 135–145)
WBC: 4.3 10*3/uL — ABNORMAL LOW (ref 4.5–10.5)

## 2008-07-25 ENCOUNTER — Ambulatory Visit: Payer: Self-pay | Admitting: Cardiology

## 2008-07-28 ENCOUNTER — Telehealth: Payer: Self-pay | Admitting: Internal Medicine

## 2008-08-01 ENCOUNTER — Encounter: Payer: Self-pay | Admitting: Internal Medicine

## 2008-08-07 ENCOUNTER — Telehealth: Payer: Self-pay | Admitting: Internal Medicine

## 2008-08-15 ENCOUNTER — Ambulatory Visit: Payer: Self-pay | Admitting: Internal Medicine

## 2008-08-22 ENCOUNTER — Ambulatory Visit: Payer: Self-pay | Admitting: Internal Medicine

## 2008-08-29 ENCOUNTER — Ambulatory Visit: Payer: Self-pay | Admitting: Cardiovascular Disease

## 2008-09-05 ENCOUNTER — Ambulatory Visit: Payer: Self-pay | Admitting: Internal Medicine

## 2008-09-05 ENCOUNTER — Telehealth (INDEPENDENT_AMBULATORY_CARE_PROVIDER_SITE_OTHER): Payer: Self-pay | Admitting: *Deleted

## 2008-09-05 DIAGNOSIS — R300 Dysuria: Secondary | ICD-10-CM | POA: Insufficient documentation

## 2008-09-05 HISTORY — DX: Dysuria: R30.0

## 2008-09-05 LAB — CONVERTED CEMR LAB
Hemoglobin, Urine: NEGATIVE
Mucus, UA: NEGATIVE
Nitrite: NEGATIVE
RBC / HPF: NONE SEEN
Specific Gravity, Urine: 1.015 (ref 1.000–1.03)
Total Protein, Urine: NEGATIVE mg/dL
pH: 6 (ref 5.0–8.0)

## 2008-09-07 ENCOUNTER — Telehealth (INDEPENDENT_AMBULATORY_CARE_PROVIDER_SITE_OTHER): Payer: Self-pay | Admitting: *Deleted

## 2008-09-19 ENCOUNTER — Ambulatory Visit: Payer: Self-pay | Admitting: Internal Medicine

## 2008-09-29 ENCOUNTER — Ambulatory Visit: Payer: Self-pay | Admitting: Internal Medicine

## 2008-10-24 ENCOUNTER — Ambulatory Visit: Payer: Self-pay | Admitting: Internal Medicine

## 2008-10-24 ENCOUNTER — Ambulatory Visit: Payer: Self-pay | Admitting: Cardiovascular Disease

## 2008-11-17 ENCOUNTER — Encounter: Payer: Self-pay | Admitting: Internal Medicine

## 2008-11-21 ENCOUNTER — Ambulatory Visit: Payer: Self-pay | Admitting: Internal Medicine

## 2008-11-21 ENCOUNTER — Ambulatory Visit: Payer: Self-pay | Admitting: Cardiovascular Disease

## 2008-12-20 ENCOUNTER — Ambulatory Visit: Payer: Self-pay | Admitting: Cardiology

## 2008-12-21 ENCOUNTER — Encounter: Payer: Self-pay | Admitting: Internal Medicine

## 2008-12-22 ENCOUNTER — Encounter: Payer: Self-pay | Admitting: Internal Medicine

## 2009-01-04 ENCOUNTER — Telehealth: Payer: Self-pay | Admitting: Internal Medicine

## 2009-01-17 ENCOUNTER — Ambulatory Visit: Payer: Self-pay | Admitting: Cardiology

## 2009-01-17 ENCOUNTER — Ambulatory Visit: Payer: Self-pay | Admitting: Internal Medicine

## 2009-01-23 ENCOUNTER — Emergency Department (HOSPITAL_COMMUNITY): Admission: EM | Admit: 2009-01-23 | Discharge: 2009-01-23 | Payer: Self-pay | Admitting: Family Medicine

## 2009-01-25 ENCOUNTER — Telehealth: Payer: Self-pay | Admitting: Internal Medicine

## 2009-01-26 ENCOUNTER — Ambulatory Visit: Payer: Self-pay | Admitting: Internal Medicine

## 2009-01-26 LAB — CONVERTED CEMR LAB
POC INR: 1.8
Protime: 16.6

## 2009-01-30 ENCOUNTER — Encounter: Payer: Self-pay | Admitting: *Deleted

## 2009-02-09 ENCOUNTER — Ambulatory Visit: Payer: Self-pay | Admitting: Cardiology

## 2009-02-09 LAB — CONVERTED CEMR LAB: Protime: 14.7

## 2009-02-16 ENCOUNTER — Ambulatory Visit: Payer: Self-pay | Admitting: Cardiology

## 2009-02-27 ENCOUNTER — Ambulatory Visit: Payer: Self-pay | Admitting: Internal Medicine

## 2009-03-02 ENCOUNTER — Ambulatory Visit: Payer: Self-pay | Admitting: Cardiology

## 2009-03-02 LAB — CONVERTED CEMR LAB
Bilirubin, Direct: 0.1 mg/dL (ref 0.0–0.3)
Cholesterol: 116 mg/dL (ref 0–200)
Eosinophils Absolute: 0.1 10*3/uL (ref 0.0–0.7)
GFR calc non Af Amer: 37.92 mL/min (ref >60)
HDL: 40.1 mg/dL (ref >39.00)
Ketones, ur: NEGATIVE mg/dL
Leukocytes, UA: NEGATIVE
MCHC: 33.1 g/dL (ref 30.0–36.0)
MCV: 89.5 fL (ref 78.0–100.0)
Monocytes Absolute: 0.4 10*3/uL (ref 0.1–1.0)
Neutrophils Relative %: 65.8 % (ref 43.0–77.0)
Nitrite: NEGATIVE
Platelets: 167 10*3/uL (ref 150.0–400.0)
Potassium: 5.1 meq/L (ref 3.5–5.1)
Prothrombin Time: 15 s
Sodium: 138 meq/L (ref 135–145)
Specific Gravity, Urine: <=1.005 (ref 1.000–1.030)
Total Bilirubin: 0.5 mg/dL (ref 0.3–1.2)
Total Protein: 6.1 g/dL (ref 6.0–8.3)
VLDL: 11.6 mg/dL (ref 0.0–40.0)
pH: 7 (ref 5.0–8.0)

## 2009-03-03 ENCOUNTER — Encounter: Payer: Self-pay | Admitting: Internal Medicine

## 2009-03-07 ENCOUNTER — Encounter: Payer: Self-pay | Admitting: *Deleted

## 2009-03-13 ENCOUNTER — Ambulatory Visit: Payer: Self-pay | Admitting: Cardiology

## 2009-03-13 ENCOUNTER — Encounter: Payer: Self-pay | Admitting: Internal Medicine

## 2009-03-13 ENCOUNTER — Encounter (INDEPENDENT_AMBULATORY_CARE_PROVIDER_SITE_OTHER): Payer: Self-pay | Admitting: Cardiology

## 2009-03-13 LAB — CONVERTED CEMR LAB
POC INR: 4
Prothrombin Time: 24.4 s

## 2009-03-29 ENCOUNTER — Ambulatory Visit: Payer: Self-pay | Admitting: Internal Medicine

## 2009-04-12 ENCOUNTER — Ambulatory Visit: Payer: Self-pay | Admitting: Internal Medicine

## 2009-04-12 LAB — CONVERTED CEMR LAB: POC INR: 3.4

## 2009-04-26 ENCOUNTER — Ambulatory Visit: Payer: Self-pay | Admitting: Internal Medicine

## 2009-04-26 LAB — CONVERTED CEMR LAB: POC INR: 2.3

## 2009-05-01 ENCOUNTER — Ambulatory Visit: Payer: Self-pay | Admitting: Internal Medicine

## 2009-05-02 ENCOUNTER — Telehealth: Payer: Self-pay | Admitting: Internal Medicine

## 2009-05-17 ENCOUNTER — Ambulatory Visit: Payer: Self-pay | Admitting: Cardiology

## 2009-05-31 ENCOUNTER — Ambulatory Visit: Payer: Self-pay | Admitting: Internal Medicine

## 2009-06-12 ENCOUNTER — Telehealth: Payer: Self-pay | Admitting: Internal Medicine

## 2009-06-14 ENCOUNTER — Ambulatory Visit: Payer: Self-pay | Admitting: Cardiovascular Disease

## 2009-06-14 LAB — CONVERTED CEMR LAB: POC INR: 1.8

## 2009-07-02 ENCOUNTER — Ambulatory Visit: Payer: Self-pay | Admitting: Internal Medicine

## 2009-07-02 ENCOUNTER — Ambulatory Visit: Payer: Self-pay | Admitting: Cardiology

## 2009-07-02 DIAGNOSIS — N259 Disorder resulting from impaired renal tubular function, unspecified: Secondary | ICD-10-CM

## 2009-07-02 HISTORY — DX: Disorder resulting from impaired renal tubular function, unspecified: N25.9

## 2009-07-02 LAB — CONVERTED CEMR LAB: POC INR: 2.4

## 2009-07-04 LAB — CONVERTED CEMR LAB
BUN: 29 mg/dL — ABNORMAL HIGH (ref 6–23)
Chloride: 110 meq/L (ref 96–112)
Cholesterol: 130 mg/dL (ref 0–200)
Creatinine, Ser: 1.4 mg/dL — ABNORMAL HIGH (ref 0.4–1.2)
GFR calc non Af Amer: 37.89 mL/min (ref >60)
Glucose, Bld: 174 mg/dL — ABNORMAL HIGH (ref 70–99)
LDL Cholesterol: 76 mg/dL (ref 0–99)

## 2009-07-17 ENCOUNTER — Encounter: Payer: Self-pay | Admitting: Internal Medicine

## 2009-07-23 ENCOUNTER — Telehealth: Payer: Self-pay | Admitting: Internal Medicine

## 2009-07-24 ENCOUNTER — Ambulatory Visit: Payer: Self-pay | Admitting: Cardiology

## 2009-07-24 LAB — CONVERTED CEMR LAB: POC INR: 2.1

## 2009-08-01 ENCOUNTER — Telehealth: Payer: Self-pay | Admitting: Internal Medicine

## 2009-08-01 ENCOUNTER — Ambulatory Visit: Payer: Self-pay | Admitting: Internal Medicine

## 2009-08-21 ENCOUNTER — Ambulatory Visit: Payer: Self-pay | Admitting: Internal Medicine

## 2009-08-21 ENCOUNTER — Ambulatory Visit: Payer: Self-pay | Admitting: Cardiology

## 2009-08-21 ENCOUNTER — Encounter (INDEPENDENT_AMBULATORY_CARE_PROVIDER_SITE_OTHER): Payer: Self-pay | Admitting: Cardiology

## 2009-08-21 ENCOUNTER — Telehealth: Payer: Self-pay | Admitting: Internal Medicine

## 2009-08-21 LAB — CONVERTED CEMR LAB: POC INR: 2.2

## 2009-09-01 DIAGNOSIS — I509 Heart failure, unspecified: Secondary | ICD-10-CM

## 2009-09-01 DIAGNOSIS — I219 Acute myocardial infarction, unspecified: Secondary | ICD-10-CM

## 2009-09-01 HISTORY — DX: Acute myocardial infarction, unspecified: I21.9

## 2009-09-01 HISTORY — PX: MOHS SURGERY: SUR867

## 2009-09-01 HISTORY — DX: Heart failure, unspecified: I50.9

## 2009-09-19 ENCOUNTER — Ambulatory Visit: Payer: Self-pay | Admitting: Cardiology

## 2009-09-19 LAB — CONVERTED CEMR LAB: POC INR: 1.6

## 2009-09-21 ENCOUNTER — Encounter: Payer: Self-pay | Admitting: Internal Medicine

## 2009-10-02 ENCOUNTER — Ambulatory Visit: Payer: Self-pay | Admitting: Internal Medicine

## 2009-10-02 HISTORY — PX: CARDIAC CATHETERIZATION: SHX172

## 2009-10-16 ENCOUNTER — Ambulatory Visit: Payer: Self-pay | Admitting: Internal Medicine

## 2009-10-16 ENCOUNTER — Telehealth: Payer: Self-pay | Admitting: Internal Medicine

## 2009-10-16 ENCOUNTER — Ambulatory Visit: Payer: Self-pay | Admitting: Cardiology

## 2009-10-18 ENCOUNTER — Telehealth: Payer: Self-pay | Admitting: Internal Medicine

## 2009-10-21 ENCOUNTER — Emergency Department (HOSPITAL_COMMUNITY): Admission: EM | Admit: 2009-10-21 | Discharge: 2009-10-21 | Payer: Self-pay | Admitting: Family Medicine

## 2009-10-23 ENCOUNTER — Ambulatory Visit: Payer: Self-pay | Admitting: Cardiology

## 2009-10-23 ENCOUNTER — Inpatient Hospital Stay (HOSPITAL_COMMUNITY): Admission: EM | Admit: 2009-10-23 | Discharge: 2009-10-26 | Payer: Self-pay | Admitting: Emergency Medicine

## 2009-10-24 ENCOUNTER — Encounter (INDEPENDENT_AMBULATORY_CARE_PROVIDER_SITE_OTHER): Payer: Self-pay | Admitting: Internal Medicine

## 2009-10-30 ENCOUNTER — Ambulatory Visit: Payer: Self-pay | Admitting: Internal Medicine

## 2009-10-30 ENCOUNTER — Telehealth: Payer: Self-pay | Admitting: Internal Medicine

## 2009-10-30 LAB — CONVERTED CEMR LAB: POC INR: 1.6

## 2009-11-02 ENCOUNTER — Ambulatory Visit: Payer: Self-pay | Admitting: Internal Medicine

## 2009-11-02 DIAGNOSIS — B0229 Other postherpetic nervous system involvement: Secondary | ICD-10-CM

## 2009-11-02 DIAGNOSIS — J309 Allergic rhinitis, unspecified: Secondary | ICD-10-CM | POA: Insufficient documentation

## 2009-11-02 HISTORY — DX: Allergic rhinitis, unspecified: J30.9

## 2009-11-02 HISTORY — DX: Other postherpetic nervous system involvement: B02.29

## 2009-11-13 ENCOUNTER — Ambulatory Visit: Payer: Self-pay | Admitting: Cardiology

## 2009-11-13 ENCOUNTER — Ambulatory Visit: Payer: Self-pay | Admitting: Internal Medicine

## 2009-11-13 LAB — CONVERTED CEMR LAB: POC INR: 2.3

## 2009-11-17 ENCOUNTER — Inpatient Hospital Stay (HOSPITAL_COMMUNITY): Admission: EM | Admit: 2009-11-17 | Discharge: 2009-11-19 | Payer: Self-pay | Admitting: Emergency Medicine

## 2009-11-17 ENCOUNTER — Ambulatory Visit: Payer: Self-pay | Admitting: Internal Medicine

## 2009-11-19 ENCOUNTER — Encounter (INDEPENDENT_AMBULATORY_CARE_PROVIDER_SITE_OTHER): Payer: Self-pay | Admitting: Internal Medicine

## 2009-11-22 ENCOUNTER — Ambulatory Visit: Payer: Self-pay | Admitting: Internal Medicine

## 2009-11-28 ENCOUNTER — Encounter: Payer: Self-pay | Admitting: Internal Medicine

## 2009-12-04 ENCOUNTER — Ambulatory Visit: Payer: Self-pay | Admitting: Internal Medicine

## 2009-12-04 ENCOUNTER — Ambulatory Visit: Payer: Self-pay | Admitting: Cardiology

## 2009-12-04 LAB — CONVERTED CEMR LAB: POC INR: 1.9

## 2009-12-14 ENCOUNTER — Ambulatory Visit: Payer: Self-pay | Admitting: Internal Medicine

## 2009-12-14 DIAGNOSIS — I5032 Chronic diastolic (congestive) heart failure: Secondary | ICD-10-CM

## 2009-12-14 HISTORY — DX: Chronic diastolic (congestive) heart failure: I50.32

## 2009-12-20 ENCOUNTER — Ambulatory Visit: Payer: Self-pay | Admitting: Internal Medicine

## 2009-12-20 LAB — CONVERTED CEMR LAB: POC INR: 2.6

## 2009-12-27 LAB — CONVERTED CEMR LAB
BUN: 44 mg/dL — ABNORMAL HIGH (ref 6–23)
Chloride: 109 meq/L (ref 96–112)
Potassium: 5.3 meq/L (ref 3.5–5.3)
Sodium: 141 meq/L (ref 135–145)

## 2010-01-04 ENCOUNTER — Emergency Department (HOSPITAL_COMMUNITY): Admission: EM | Admit: 2010-01-04 | Discharge: 2010-01-04 | Payer: Self-pay | Admitting: Family Medicine

## 2010-01-16 ENCOUNTER — Telehealth: Payer: Self-pay | Admitting: Internal Medicine

## 2010-01-17 ENCOUNTER — Ambulatory Visit: Payer: Self-pay | Admitting: Internal Medicine

## 2010-01-17 LAB — CONVERTED CEMR LAB: POC INR: 2.8

## 2010-01-18 ENCOUNTER — Inpatient Hospital Stay (HOSPITAL_COMMUNITY): Admission: EM | Admit: 2010-01-18 | Discharge: 2010-01-23 | Payer: Self-pay | Admitting: Emergency Medicine

## 2010-01-18 ENCOUNTER — Encounter: Payer: Self-pay | Admitting: Internal Medicine

## 2010-01-21 ENCOUNTER — Ambulatory Visit: Payer: Self-pay | Admitting: Physical Medicine & Rehabilitation

## 2010-01-23 ENCOUNTER — Inpatient Hospital Stay (HOSPITAL_COMMUNITY)
Admission: RE | Admit: 2010-01-23 | Discharge: 2010-02-01 | Payer: Self-pay | Admitting: Physical Medicine & Rehabilitation

## 2010-01-23 ENCOUNTER — Ambulatory Visit: Payer: Self-pay | Admitting: Physical Medicine & Rehabilitation

## 2010-01-23 ENCOUNTER — Encounter: Payer: Self-pay | Admitting: Internal Medicine

## 2010-02-01 ENCOUNTER — Encounter: Payer: Self-pay | Admitting: Internal Medicine

## 2010-02-04 ENCOUNTER — Encounter: Payer: Self-pay | Admitting: Cardiology

## 2010-02-04 LAB — CONVERTED CEMR LAB
POC INR: 1.7
Prothrombin Time: 20.8 s

## 2010-02-08 ENCOUNTER — Telehealth: Payer: Self-pay | Admitting: Family Medicine

## 2010-02-08 ENCOUNTER — Telehealth: Payer: Self-pay | Admitting: Internal Medicine

## 2010-02-11 ENCOUNTER — Encounter: Payer: Self-pay | Admitting: Internal Medicine

## 2010-02-11 LAB — CONVERTED CEMR LAB
POC INR: 4.5
Prothrombin Time: 54 s

## 2010-02-18 ENCOUNTER — Encounter: Payer: Self-pay | Admitting: Cardiology

## 2010-02-22 ENCOUNTER — Telehealth: Payer: Self-pay | Admitting: Internal Medicine

## 2010-02-22 ENCOUNTER — Ambulatory Visit: Payer: Self-pay | Admitting: Internal Medicine

## 2010-02-25 ENCOUNTER — Encounter: Payer: Self-pay | Admitting: Cardiology

## 2010-02-26 ENCOUNTER — Encounter: Payer: Self-pay | Admitting: Internal Medicine

## 2010-02-27 ENCOUNTER — Encounter: Payer: Self-pay | Admitting: Internal Medicine

## 2010-02-28 ENCOUNTER — Telehealth: Payer: Self-pay | Admitting: Internal Medicine

## 2010-03-07 ENCOUNTER — Encounter: Payer: Self-pay | Admitting: Internal Medicine

## 2010-03-07 LAB — CONVERTED CEMR LAB: POC INR: 3.8

## 2010-03-08 ENCOUNTER — Encounter: Payer: Self-pay | Admitting: Internal Medicine

## 2010-03-18 ENCOUNTER — Ambulatory Visit: Payer: Self-pay | Admitting: Cardiology

## 2010-03-18 LAB — CONVERTED CEMR LAB: POC INR: 2.3

## 2010-03-19 ENCOUNTER — Encounter: Payer: Self-pay | Admitting: Internal Medicine

## 2010-03-20 ENCOUNTER — Encounter: Payer: Self-pay | Admitting: Internal Medicine

## 2010-03-25 ENCOUNTER — Ambulatory Visit: Payer: Self-pay | Admitting: Internal Medicine

## 2010-03-28 ENCOUNTER — Telehealth: Payer: Self-pay | Admitting: Internal Medicine

## 2010-04-01 ENCOUNTER — Ambulatory Visit: Payer: Self-pay | Admitting: Internal Medicine

## 2010-04-01 LAB — CONVERTED CEMR LAB: POC INR: 2.3

## 2010-04-29 ENCOUNTER — Ambulatory Visit: Payer: Self-pay | Admitting: Internal Medicine

## 2010-04-29 ENCOUNTER — Ambulatory Visit: Payer: Self-pay | Admitting: Cardiology

## 2010-04-29 LAB — CONVERTED CEMR LAB: POC INR: 1.7

## 2010-05-01 ENCOUNTER — Encounter: Payer: Self-pay | Admitting: Internal Medicine

## 2010-05-21 ENCOUNTER — Ambulatory Visit: Payer: Self-pay | Admitting: Cardiology

## 2010-05-21 LAB — CONVERTED CEMR LAB: POC INR: 1.9

## 2010-05-30 ENCOUNTER — Ambulatory Visit: Payer: Self-pay | Admitting: Internal Medicine

## 2010-05-30 DIAGNOSIS — E875 Hyperkalemia: Secondary | ICD-10-CM

## 2010-05-30 DIAGNOSIS — L989 Disorder of the skin and subcutaneous tissue, unspecified: Secondary | ICD-10-CM

## 2010-05-30 DIAGNOSIS — H9190 Unspecified hearing loss, unspecified ear: Secondary | ICD-10-CM

## 2010-05-30 DIAGNOSIS — H918X9 Other specified hearing loss, unspecified ear: Secondary | ICD-10-CM

## 2010-05-30 HISTORY — DX: Disorder of the skin and subcutaneous tissue, unspecified: L98.9

## 2010-05-30 HISTORY — DX: Hyperkalemia: E87.5

## 2010-05-30 HISTORY — DX: Other specified hearing loss, unspecified ear: H91.8X9

## 2010-05-31 ENCOUNTER — Encounter: Payer: Self-pay | Admitting: Internal Medicine

## 2010-05-31 LAB — CONVERTED CEMR LAB
BUN: 43 mg/dL — ABNORMAL HIGH (ref 6–23)
CO2: 25 meq/L (ref 19–32)
Chloride: 114 meq/L — ABNORMAL HIGH (ref 96–112)
Glucose, Bld: 99 mg/dL (ref 70–99)
HDL: 38.4 mg/dL — ABNORMAL LOW (ref >39.00)
Hgb A1c MFr Bld: 6.3 % (ref 4.6–6.5)
Potassium: 6.2 meq/L (ref 3.5–5.1)
Total CHOL/HDL Ratio: 4
VLDL: 23 mg/dL (ref 0.0–40.0)

## 2010-06-11 ENCOUNTER — Ambulatory Visit: Payer: Self-pay | Admitting: Cardiology

## 2010-06-18 ENCOUNTER — Emergency Department (HOSPITAL_COMMUNITY): Admission: EM | Admit: 2010-06-18 | Discharge: 2010-06-18 | Payer: Self-pay | Admitting: Emergency Medicine

## 2010-06-19 ENCOUNTER — Encounter: Payer: Self-pay | Admitting: Internal Medicine

## 2010-06-19 ENCOUNTER — Ambulatory Visit: Payer: Self-pay | Admitting: Internal Medicine

## 2010-06-19 LAB — CONVERTED CEMR LAB: POC INR: 2.7

## 2010-06-28 ENCOUNTER — Encounter: Payer: Self-pay | Admitting: Internal Medicine

## 2010-06-28 LAB — HM MAMMOGRAPHY: HM Mammogram: NORMAL

## 2010-07-01 ENCOUNTER — Ambulatory Visit: Payer: Self-pay | Admitting: Internal Medicine

## 2010-07-01 ENCOUNTER — Ambulatory Visit: Payer: Self-pay | Admitting: Cardiology

## 2010-07-22 ENCOUNTER — Encounter: Payer: Self-pay | Admitting: Internal Medicine

## 2010-07-22 ENCOUNTER — Ambulatory Visit: Payer: Self-pay | Admitting: Cardiovascular Disease

## 2010-07-30 ENCOUNTER — Ambulatory Visit: Payer: Self-pay | Admitting: Internal Medicine

## 2010-08-16 ENCOUNTER — Ambulatory Visit: Payer: Self-pay | Admitting: Cardiology

## 2010-08-16 LAB — CONVERTED CEMR LAB: POC INR: 1.7

## 2010-08-29 ENCOUNTER — Ambulatory Visit: Payer: Self-pay | Admitting: Cardiology

## 2010-08-29 ENCOUNTER — Ambulatory Visit: Payer: Self-pay | Admitting: Internal Medicine

## 2010-08-29 LAB — CONVERTED CEMR LAB: POC INR: 2.4

## 2010-09-09 ENCOUNTER — Telehealth: Payer: Self-pay | Admitting: Internal Medicine

## 2010-09-12 ENCOUNTER — Encounter: Payer: Self-pay | Admitting: Internal Medicine

## 2010-09-18 ENCOUNTER — Encounter: Payer: Self-pay | Admitting: Internal Medicine

## 2010-09-19 ENCOUNTER — Telehealth: Payer: Self-pay | Admitting: Internal Medicine

## 2010-09-21 ENCOUNTER — Encounter: Payer: Self-pay | Admitting: Internal Medicine

## 2010-09-22 ENCOUNTER — Encounter: Payer: Self-pay | Admitting: Internal Medicine

## 2010-09-27 ENCOUNTER — Ambulatory Visit
Admission: RE | Admit: 2010-09-27 | Discharge: 2010-09-27 | Payer: Self-pay | Source: Home / Self Care | Attending: Internal Medicine | Admitting: Internal Medicine

## 2010-09-27 ENCOUNTER — Ambulatory Visit: Admission: RE | Admit: 2010-09-27 | Discharge: 2010-09-27 | Payer: Self-pay | Source: Home / Self Care

## 2010-10-03 NOTE — Assessment & Plan Note (Signed)
Summary: POST HOSP/[facility]   Vital Signs:  Patient profile:   77 year old female Height:      66.5 inches Weight:      135 pounds O2 Sat:      97 % on Room air Temp:     97.5 degrees F oral Pulse rate:   78 / minute BP sitting:   118 / 46  (left arm) Cuff size:   regular  Vitals Entered ByZella Ball Ewing (November 22, 2009 9:01 AM)  O2 Flow:  Room air  Preventive Care Screening     declines tetanus today  CC: Post Hospital/RE   CC:  Post Hospital/RE.  History of Present Illness: wt this at home 134.2;  Pt denies CP, sob, doe, wheezing, orthopnea, pnd, worsening LE edema, palps, dizziness or syncope   Pt denies new neuro symptoms such as headache, facial or extremity weakness   Pt denies polydipsia, polyuria, or low sugar symptoms such as shakiness improved with eating.  Overall good compliance with meds, trying to follow low chol, DM diet, wt stable, little excercise however   Here for wellness Diet: Heart Healthy or DM if diabetic Physical Activities: Sedentary Depression/mood screen: Negative Hearing:marked loss bilateral, has been eval in past Visual Acuity: Grossly intact, sees optho regularly ADL's: Capable  Fall Risk: midl to mod Home Safety: Good End-of-Life Planning: Advance directive - Full code/I agree   Problems Prior to Update: 1)  Postherpetic Neuralgia  (ICD-053.19) 2)  Preventive Health Care  (ICD-V70.0) 3)  Allergic Rhinitis  (ICD-477.9) 4)  Coronary Artery Disease  (ICD-414.00) 5)  Myocardial Infarction, Hx of  (ICD-412) 6)  Renal Insufficiency  (ICD-588.9) 7)  Dysuria  (ICD-788.1) 8)  Laceration, Forehead  (ICD-873.42) 9)  Fatigue  (ICD-780.79) 10)  Blepharitis, Bilateral  (ICD-373.00) 11)  Postural Hypotension  (ICD-458.0) 12)  Syncope  (ICD-780.2) 13)  Depressive Disorder  (ICD-311) 14)  Constipation  (ICD-564.00) 15)  Uti  (ICD-599.0) 16)  Epigastric Tenderness  (ICD-789.66) 17)  Uti  (ICD-599.0) 18)  Renal Cyst, Left  (ICD-593.2) 19)  Abdominal  Pain, Epigastric  (ICD-789.06) 20)  Dysuria  (ICD-788.1) 21)  Fatigue  (ICD-780.79) 22)  Diabetes Mellitus, Type II  (ICD-250.00) 23)  Back Pain  (ICD-724.5) 24)  Family History Diabetes 1st Degree Relative  (ICD-V18.0) 25)  Shingles, Hx of  (ICD-V13.8) 26)  Pulmonary Embolism, Hx of  (ICD-V12.51) 27)  Osteoporosis  (ICD-733.00) 28)  Hyperlipidemia  (ICD-272.4) 29)  Atrial Fibrillation  (ICD-427.31) 30)  Anticoagulation Therapy  (ICD-V58.61) 31)  B12 Deficiency  (ICD-266.2) 32)  Psvt  (ICD-427.0) 33)  Hypertension  (ICD-401.9) 34)  Gerd  (ICD-530.81) 35)  Anemia-iron Deficiency  (ICD-280.9)  Medications Prior to Update: 1)  Accu-Chek Compact Test Drum  Strp (Glucose Blood) .Marland Kitchen.. 1 Two Times A Day Use Asd - Dx Code 250.02 2)  Labetalol Hcl 200 Mg  Tabs (Labetalol Hcl) .... 2 By Mouth Two Times A Day 3)  Catapres-Tts-3 0.3 Mg/24hr Ptwk (Clonidine Hcl) .... Apply Once Weekly 4)  Ferrous Sulfate 325 (65 Fe) Mg Tabs (Ferrous Sulfate) .... Take 1 Tablet By Mouth Twice A  Day 5)  Fosamax 70 Mg Tabs (Alendronate Sodium) .Marland Kitchen.. 1 Tablet By Mouth Once A Week 6)  Glimepiride 4 Mg Tabs (Glimepiride) .... Take 1 By Mouth Once Daily Only 7)  Lantus 100 Unit/ml Soln (Insulin Glargine) .... Inject 8 Unit Subcutaneously Once A Day 8)  Metformin Hcl 500 Mg Tabs (Metformin Hcl) .Marland Kitchen.. 1 By Mouth Bid 9)  Pantoprazole  Sodium 40 Mg  Tbec (Pantoprazole Sodium) .Marland Kitchen.. 1po Qd 10)  Warfarin Sodium 5 Mg Tabs (Warfarin Sodium) .... Take As Directed By [facility]. 11)  Bd Insulin Syringe Ultrafine 31g X 5/16" 0.5 Ml  Misc (Insulin Syringe-Needle U-100) .... Use 1 Qd 12)  Sertraline Hcl 50 Mg  Tabs (Sertraline Hcl) .Marland Kitchen.. 1po Once Daily 13)  Kionex 15 Gm/46ml Susp (Sodium Polystyrene Sulfonate) .Marland Kitchen.. 1 At Bedtime 14)  Felodipine 10 Mg Xr24h-Tab (Felodipine) .Marland Kitchen.. 1 By Mouth Daily 15)  Tramadol Hcl 50 Mg Tabs (Tramadol Hcl) .Marland Kitchen.. 1 - 2 By Mouth Two Times A Day As Needed 16)  Stool Softner .... As Needed 17)   Pravastatin Sodium 40 Mg Tabs (Pravastatin Sodium) .... Take 1 By Mouth Qd 18)  Bactrim Ds 800-160 Mg Tabs (Sulfamethoxazole-Trimethoprim) .Marland Kitchen.. 1po Once Daily 19)  Aspir-Low 81 Mg Tbec (Aspirin) .Marland Kitchen.. 1po Once Daily 20)  Fexofenadine Hcl 180 Mg Tabs (Fexofenadine Hcl) .Marland Kitchen.. 1po Once Daily As Needed Allergies 21)  Gabapentin 300 Mg Caps (Gabapentin) .Marland Kitchen.. 1 By Mouth Three Times A Day 22)  Nasonex 50 Mcg/act Susp (Mometasone Furoate) .... 2 Spray/side Per Day  Current Medications (verified): 1)  Accu-Chek Compact Test Drum  Strp (Glucose Blood) .Marland Kitchen.. 1 Two Times A Day Use Asd - Dx Code 250.02 2)  Labetalol Hcl 200 Mg  Tabs (Labetalol Hcl) .... 2 By Mouth Two Times A Day 3)  Catapres-Tts-3 0.3 Mg/24hr Ptwk (Clonidine Hcl) .... Apply Once Weekly 4)  Ferrous Sulfate 325 (65 Fe) Mg Tabs (Ferrous Sulfate) .... Take 1 Tablet By Mouth Twice A  Day 5)  Fosamax 70 Mg Tabs (Alendronate Sodium) .Marland Kitchen.. 1 Tablet By Mouth Once A Week 6)  Glimepiride 4 Mg Tabs (Glimepiride) .... Take 1 By Mouth Once Daily Only 7)  Lantus 100 Unit/ml Soln (Insulin Glargine) .... Inject 8 Unit Subcutaneously Once A Day 8)  Metformin Hcl 500 Mg Tabs (Metformin Hcl) .Marland Kitchen.. 1 By Mouth Bid 9)  Pantoprazole Sodium 40 Mg  Tbec (Pantoprazole Sodium) .Marland Kitchen.. 1po Qd 10)  Warfarin Sodium 5 Mg Tabs (Warfarin Sodium) .... Take As Directed By [facility]. 11)  Bd Insulin Syringe Ultrafine 31g X 5/16" 0.5 Ml  Misc (Insulin Syringe-Needle U-100) .... Use 1 Qd 12)  Sertraline Hcl 50 Mg  Tabs (Sertraline Hcl) .Marland Kitchen.. 1po Once Daily 13)  Felodipine 10 Mg Xr24h-Tab (Felodipine) .Marland Kitchen.. 1 By Mouth Daily 14)  Tramadol Hcl 50 Mg Tabs (Tramadol Hcl) .Marland Kitchen.. 1 - 2 By Mouth Two Times A Day As Needed 15)  Stool Softner .... As Needed 16)  Pravachol 20 Mg Tabs (Pravastatin Sodium) .Marland Kitchen.. 1po Once Daily 17)  Bactrim Ds 800-160 Mg Tabs (Sulfamethoxazole-Trimethoprim) .Marland Kitchen.. 1po Once Daily 18)  Aspir-Low 81 Mg Tbec (Aspirin) .Marland Kitchen.. 1po Once Daily 19)  Fexofenadine Hcl 180 Mg  Tabs (Fexofenadine Hcl) .Marland Kitchen.. 1po Once Daily As Needed Allergies 20)  Gabapentin 300 Mg Caps (Gabapentin) .Marland Kitchen.. 1 By Mouth Three Times A Day 21)  Nasonex 50 Mcg/act Susp (Mometasone Furoate) .... 2 Spray/side Per Day 22)  Furosemide 20 Mg Tabs (Furosemide) .Marland Kitchen.. 1 By Mouth Two Times A Day  Allergies (verified): 1)  ! Valium 2)  ! Cardizem 3)  ! * Deltasone 4)  ! * Tequin 5)  ! * Lyrica 6)  ! * Amiodarone 7)  ! * Duragesic 8)  ! * Spironolactone 9)  Levaquin  Past History:  Past Medical History: Last updated: 11/02/2009 Anemia-iron deficiency GERDw/ stricture Hypertension B12 Deficiency Anticoagulation therapy Atrial fibrillation Hyperlipidemia Osteoporosis Pulmonary  embolism, hx of hx of shingles - right facial renal cyst Diabetes mellitus, type II macular degeneration - dr Luciana Axe - s/p laser oct 2010 diabetic retinopathy -  dr Luciana Axe  -  s/p laser 2007 Renal insufficiency Myocardial infarction, hx of Coronary artery disease Allergic rhinitis  Past Surgical History: Last updated: 09/06/2007 Appendectomy Hysterectomy  Family History: Last updated: 08/13/2007 brother died with esophageal perforation Family History Diabetes 1st degree relative - 2 sister Family History Hypertension Mother with breast cancer father with colon cancer, died with stroke postop  Social History: Last updated: 06/21/2008 Never Smoked Alcohol use-no Retired with 2 daughters today Drug use-no Regular exercise-yes  Risk Factors: Exercise: yes (06/21/2008)  Risk Factors: Smoking Status: never (13-Aug-2007) Passive Smoke Exposure: no (06/21/2008)  Review of Systems  The patient denies anorexia, fever, vision loss, decreased hearing, hoarseness, chest pain, syncope, dyspnea on exertion, peripheral edema, prolonged cough, hemoptysis, abdominal pain, melena, hematochezia, severe indigestion/heartburn, hematuria, muscle weakness, suspicious skin lesions, transient blindness, difficulty  walking, unusual weight change, abnormal bleeding, enlarged lymph nodes, and angioedema.         all otherwise negative per pt -    Physical Exam  General:  alert and well-developed.   Head:  normocephalic and atraumatic.   Eyes:  vision grossly intact, pupils equal, and pupils round.   Ears:  R ear normal and L ear normal.   Nose:  no external deformity and no nasal discharge.   Mouth:  no gingival abnormalities and pharynx pink and moist.   Neck:  supple and no masses.   Lungs:  normal respiratory effort, R decreased breath sounds, and L decreased breath sounds.   Heart:  normal rate and irregular rhythm.   Abdomen:  soft, non-tender, and normal bowel sounds.   Msk:  no joint tenderness and no joint swelling.   Extremities:  no edema, no erythema  Neurologic:  cranial nerves II-XII intact and strength normal in all extremities.   Skin:  color normal and no rashes.   Psych:  normally interactive and not anxious appearing.     Impression & Recommendations:  Problem # 1:  Preventive Health Care (ICD-V70.0)  Overall doing well, age appropriate education and counseling updated and referral for appropriate preventive services done unless declined, immunizations up to date or declined, diet counseling done if overweight, urged to quit smoking if smokes , most recent labs reviewed and current ordered if appropriate, ecg reviewed or declined (interpretation per ECG scanned in the EMR if done); information regarding Medicare Prevention requirements given if appropriate   Orders: First annual wellness visit with prevention plan  ([id number])  Problem # 2:  HYPERLIPIDEMIA (ICD-272.4)  Her updated medication list for this problem includes:    Pravachol 20 Mg Tabs (Pravastatin sodium) .Marland Kitchen... 1po once daily per hosp echart - had ldl 46 -  ok to reduce the pravachol to 20 mg per day  Orders: Prescription Created Electronically [phone])  Problem # 3:  HYPERTENSION (ICD-401.9)  Her updated  medication list for this problem includes:    Labetalol Hcl 200 Mg Tabs (Labetalol hcl) .Marland Kitchen... 2 by mouth two times a day    Catapres-tts-3 0.3 Mg/24hr Ptwk (Clonidine hcl) .Marland Kitchen... Apply once weekly    Felodipine 10 Mg Xr24h-tab (Felodipine) .Marland Kitchen... 1 by mouth daily    Furosemide 20 Mg Tabs (Furosemide) .Marland Kitchen... 1 by mouth two times a day  BP today: 118/46 Prior BP: 110/60 (11/02/2009)  Prior 10 Yr Risk Heart Disease: N/A (11/02/2009)  Labs Reviewed:  K+: 5.6 (07/02/2009) Creat: : 1.4 (07/02/2009)   Chol: 130 (07/02/2009)   HDL: 35.00 (07/02/2009)   LDL: 76 (07/02/2009)   TG: 95.0 (07/02/2009) stable overall by hx and exam, ok to continue meds/tx as is   Problem # 4:  RENAL INSUFFICIENCY (ICD-588.9) with volume overload recent - for laxis asd  Problem # 5:  DIABETES MELLITUS, TYPE II (ICD-250.00)  Her updated medication list for this problem includes:    Glimepiride 4 Mg Tabs (Glimepiride) .Marland Kitchen... Take 1 by mouth once daily only    Lantus 100 Unit/ml Soln (Insulin glargine) ..... Inject 8 unit subcutaneously once a day    Metformin Hcl 500 Mg Tabs (Metformin hcl) .Marland Kitchen... 1 by mouth bid    Aspir-low 81 Mg Tbec (Aspirin) .Marland Kitchen... 1po once daily  Labs Reviewed: Creat: 1.4 (07/02/2009)    Reviewed HgBA1c results: 6.7 (07/02/2009)  5.9 (02/27/2009) stable overall by hx and exam, ok to continue meds/tx as is   Complete Medication List: 1)  Accu-chek Compact Test Drum Strp (Glucose blood) .Marland Kitchen.. 1 two times a day use asd - dx code 250.02 2)  Labetalol Hcl 200 Mg Tabs (Labetalol hcl) .... 2 by mouth two times a day 3)  Catapres-tts-3 0.3 Mg/24hr Ptwk (Clonidine hcl) .... Apply once weekly 4)  Ferrous Sulfate 325 (65 Fe) Mg Tabs (Ferrous sulfate) .... Take 1 tablet by mouth twice a  day 5)  Fosamax 70 Mg Tabs (Alendronate sodium) .Marland Kitchen.. 1 tablet by mouth once a week 6)  Glimepiride 4 Mg Tabs (Glimepiride) .... Take 1 by mouth once daily only 7)  Lantus 100 Unit/ml Soln (Insulin glargine) .... Inject 8  unit subcutaneously once a day 8)  Metformin Hcl 500 Mg Tabs (Metformin hcl) .Marland Kitchen.. 1 by mouth bid 9)  Pantoprazole Sodium 40 Mg Tbec (Pantoprazole sodium) .Marland Kitchen.. 1po qd 10)  Warfarin Sodium 5 Mg Tabs (Warfarin sodium) .... Take as directed by coumadin clinic. 11)  Bd Insulin Syringe Ultrafine 31g X 5/16" 0.5 Ml Misc (Insulin syringe-needle u-100) .... Use 1 qd 12)  Sertraline Hcl 50 Mg Tabs (Sertraline hcl) .Marland Kitchen.. 1po once daily 13)  Felodipine 10 Mg Xr24h-tab (Felodipine) .Marland Kitchen.. 1 by mouth daily 14)  Tramadol Hcl 50 Mg Tabs (Tramadol hcl) .Marland Kitchen.. 1 - 2 by mouth two times a day as needed 15)  Stool Softner  .... As needed 16)  Pravachol 20 Mg Tabs (Pravastatin sodium) .Marland Kitchen.. 1po once daily 17)  Bactrim Ds 800-160 Mg Tabs (Sulfamethoxazole-trimethoprim) .Marland Kitchen.. 1po once daily 18)  Aspir-low 81 Mg Tbec (Aspirin) .Marland Kitchen.. 1po once daily 19)  Fexofenadine Hcl 180 Mg Tabs (Fexofenadine hcl) .Marland Kitchen.. 1po once daily as needed allergies 20)  Gabapentin 300 Mg Caps (Gabapentin) .Marland Kitchen.. 1 by mouth three times a day 21)  Nasonex 50 Mcg/act Susp (Mometasone furoate) .... 2 spray/side per day 22)  Furosemide 20 Mg Tabs (Furosemide) .Marland Kitchen.. 1 by mouth two times a day  Patient Instructions: 1)  decrease the pravachol to 20 mg per day 2)  take the lasix generic at 20 mg per day, with a second dose later in the day if the wt is increased by 2 lbs from the baseline at 134;  also you can hold the lasix if it seems the wt gets to 130, or if any dizziness or weakness occurs. 3)  Continue all previous medications as before this visit  4)  Please schedule a follow-up appointment in 3 months. Prescriptions: PRAVACHOL 20 MG TABS (PRAVASTATIN SODIUM) 1po once daily  #90 x 3  Entered and Authorized by:   Corwin Levins MD   Signed by:   Corwin Levins MD on 11/22/2009   Method used:   Print then Give to Patient   RxID:   [id number] FUROSEMIDE 20 MG TABS (FUROSEMIDE) 1 by mouth two times a day  #60 x 11   Entered and Authorized by:   Corwin Levins MD   Signed by:   Corwin Levins MD on 11/22/2009   Method used:   Print then Give to Patient   RxID:   [id number] PRAVACHOL 20 MG TABS (PRAVASTATIN SODIUM) 1po once daily  #90 x 3   Entered and Authorized by:   Corwin Levins MD   Signed by:   Corwin Levins MD on 11/22/2009   Method used:   Print then Give to Patient   RxID:   [id number]

## 2010-10-03 NOTE — Progress Notes (Signed)
  Phone Note From Pharmacy   Summary of Call: call from [place] at [place] [facility] [phone] she needs clarification of the hydrocodone acetaminophen 5-235 that was sent today came thru the fax but needed verbal ok from a doctor  Initial call taken by: Judith Part MD,  February 08, 2010 7:37 PM  Follow-up for Phone Call        I reviewed chart and ok'd the px for pain med from Dr Jonny Ruiz  Follow-up by: Judith Part MD,  February 08, 2010 7:37 PM

## 2010-10-03 NOTE — Assessment & Plan Note (Signed)
Summary: 3 MTH FU-- B12 ALSO--  STC   Vital Signs:  Patient profile:   77 year old female Height:      66.5 inches Weight:      137 pounds BMI:     21.86 O2 Sat:      98 % on Room air Temp:     97.3 degrees F oral Pulse rate:   73 / minute BP sitting:   98 / 48  (left arm) Cuff size:   regular  Vitals Entered By: Zella Ball Ewing CMA Duncan Dull) (February 22, 2010 9:01 AM)  O2 Flow:  Room air  CC: 3 month followup/Re   CC:  3 month followup/Re.  History of Present Illness: BP per PT and several times other at home have been low in the past 3 days after alpha blocker added due to elev BP during last hosp admit;  saw Dr Carola Frost 2 days ago with pain meds adjusted;  overall much improved but still with pelvic pain with some radiation toward the right knee;  alendronate  stopped per dr handy as she has been on it more than 5 yrs; due for b12 shot today;  Pt denies CP, sob, doe, wheezing, orthopnea, pnd, worsening LE edema, palps, dizziness or syncope  Pt denies new neuro symptoms such as headache, facial or extremity weakness No other new complaitns.  Overall good complaince with meds, good tolerance.  Pt denies polydipsia, polyuria, or low sugar symptoms such as shakiness improved with eating.  Cont's  trying to follow low chol, DM diet, wt stable.  Problems Prior to Update: 1)  Atrial Fibrillation  (ICD-427.31) 2)  Diastolic Heart Failure, Chronic  (ICD-428.32) 3)  Recent Non-stemi  (ICD-412) 4)  Postherpetic Neuralgia  (ICD-053.19) 5)  Preventive Health Care  (ICD-V70.0) 6)  Allergic Rhinitis  (ICD-477.9) 7)  Coronary Artery Disease  (ICD-414.00) 8)  Renal Insufficiency  (ICD-588.9) 9)  Dysuria  (ICD-788.1) 10)  Laceration, Forehead  (ICD-873.42) 11)  Fatigue  (ICD-780.79) 12)  Blepharitis, Bilateral  (ICD-373.00) 13)  Postural Hypotension  (ICD-458.0) 14)  Syncope  (ICD-780.2) 15)  Depressive Disorder  (ICD-311) 16)  Constipation  (ICD-564.00) 17)  Uti  (ICD-599.0) 18)  Epigastric  Tenderness  (ICD-789.66) 19)  Uti  (ICD-599.0) 20)  Renal Cyst, Left  (ICD-593.2) 21)  Abdominal Pain, Epigastric  (ICD-789.06) 22)  Dysuria  (ICD-788.1) 23)  Fatigue  (ICD-780.79) 24)  Diabetes Mellitus, Type II  (ICD-250.00) 25)  Back Pain  (ICD-724.5) 26)  Family History Diabetes 1st Degree Relative  (ICD-V18.0) 27)  Shingles, Hx of  (ICD-V13.8) 28)  Pulmonary Embolism, Hx of  (ICD-V12.51) 29)  Osteoporosis  (ICD-733.00) 30)  Hyperlipidemia  (ICD-272.4) 31)  Anticoagulation Therapy  (ICD-V58.61) 32)  B12 Deficiency  (ICD-266.2) 33)  Hypertension  (ICD-401.9) 34)  Gerd  (ICD-530.81) 35)  Anemia-iron Deficiency  (ICD-280.9)  Medications Prior to Update: 1)  Accu-Chek Compact Test Drum  Strp (Glucose Blood) .Marland Kitchen.. 1 Two Times A Day Use Asd - Dx Code 250.02 2)  Labetalol Hcl 200 Mg  Tabs (Labetalol Hcl) .... 2 By Mouth Two Times A Day 3)  Catapres-Tts-3 0.3 Mg/24hr Ptwk (Clonidine Hcl) .... Apply Once Weekly 4)  Ferrous Sulfate 325 (65 Fe) Mg Tabs (Ferrous Sulfate) .... Take 1 Tablet By Mouth Twice A  Day 5)  Fosamax 70 Mg Tabs (Alendronate Sodium) .Marland Kitchen.. 1 Tablet By Mouth Once A Week 6)  Glimepiride 4 Mg Tabs (Glimepiride) .... Take 1 By Mouth Once Daily Only 7)  Lantus 100  Unit/ml Soln (Insulin Glargine) .... Inject 8 Unit Subcutaneously Once A Day 8)  Metformin Hcl 500 Mg Tabs (Metformin Hcl) .Marland Kitchen.. 1 By Mouth Bid 9)  Pantoprazole Sodium 40 Mg  Tbec (Pantoprazole Sodium) .Marland Kitchen.. 1po Qd 10)  Warfarin Sodium 5 Mg Tabs (Warfarin Sodium) .... Take As Directed By Coumadin Clinic. 11)  Bd Insulin Syringe Ultrafine 31g X 5/16" 0.5 Ml  Misc (Insulin Syringe-Needle U-100) .... Use 1 Qd 12)  Sertraline Hcl 50 Mg  Tabs (Sertraline Hcl) .Marland Kitchen.. 1po Once Daily 13)  Felodipine 10 Mg Xr24h-Tab (Felodipine) .Marland Kitchen.. 1 By Mouth Daily 14)  Hydrocodone-Acetaminophen 5-325 Mg Tabs (Hydrocodone-Acetaminophen) .Marland Kitchen.. 1po Q 6 Hrs As Needed Pain 15)  Stool Softner .... As Needed 16)  Pravachol 20 Mg Tabs (Pravastatin  Sodium) .... 1/2 Tablet Once Daily 17)  Bactrim Ds 800-160 Mg Tabs (Sulfamethoxazole-Trimethoprim) .Marland Kitchen.. 1po Once Daily 18)  Aspir-Low 81 Mg Tbec (Aspirin) .Marland Kitchen.. 1po Once Daily 19)  Fexofenadine Hcl 180 Mg Tabs (Fexofenadine Hcl) .Marland Kitchen.. 1po Once Daily As Needed Allergies 20)  Gabapentin 300 Mg Caps (Gabapentin) .Marland Kitchen.. 1 By Mouth Three Times A Day 21)  Nasonex 50 Mcg/act Susp (Mometasone Furoate) .... 2 Spray/side Per Day 22)  Furosemide 20 Mg Tabs (Furosemide) .Marland Kitchen.. 1 By Mouth Daily  Current Medications (verified): 1)  Accu-Chek Compact Test Drum  Strp (Glucose Blood) .Marland Kitchen.. 1 Two Times A Day Use Asd - Dx Code 250.02 2)  Labetalol Hcl 200 Mg  Tabs (Labetalol Hcl) .... 2 By Mouth Two Times A Day 3)  Catapres-Tts-3 0.3 Mg/24hr Ptwk (Clonidine Hcl) .... Apply Once Weekly 4)  Ferrous Sulfate 325 (65 Fe) Mg Tabs (Ferrous Sulfate) .... Take 1 Tablet By Mouth Twice A  Day 5)  Fosamax 70 Mg Tabs (Alendronate Sodium) .Marland Kitchen.. 1 Tablet By Mouth Once A Week 6)  Glimepiride 4 Mg Tabs (Glimepiride) .... Take 1 By Mouth Once Daily Only 7)  Lantus 100 Unit/ml Soln (Insulin Glargine) .... Inject 8 Unit Subcutaneously Once A Day 8)  Metformin Hcl 500 Mg Tabs (Metformin Hcl) .Marland Kitchen.. 1 By Mouth Bid 9)  Pantoprazole Sodium 40 Mg  Tbec (Pantoprazole Sodium) .Marland Kitchen.. 1po Qd 10)  Warfarin Sodium 5 Mg Tabs (Warfarin Sodium) .... Take As Directed By [facility]mad[facility] Clinic. 11)  Bd Insulin Syringe Ultrafine 31g X 5/16" 0.5 Ml  Misc (Insulin Syringe-Needle U-100) .... Use 1 Qd 12)  Sertraline Hcl 50 Mg  Tabs (Sertraline Hcl) .Marland Kitchen.. 1po Once Daily 13)  Felodipine 10 Mg Xr24h-Tab (Felodipine) .Marland Kitchen.. 1 By Mouth Daily 14)  Hydrocodone-Acetaminophen 5-325 Mg Tabs (Hydrocodone-Acetaminophen) .Marland Kitchen.. 1po Q 6 Hrs As Needed Pain, and 1/2 By Mouth Q 3 Hrs As Needed Breakthrough (Per Dr Carola Frost) 15)  Stool Softner .... As Needed 16)  Pravachol 20 Mg Tabs (Pravastatin Sodium) .... 1/2 Tablet Once Daily 17)  Bactrim Ds 800-160 Mg Tabs (Sulfamethoxazole-Trimethoprim)  .Marland Kitchen.. 1po Once Daily 18)  Aspir-Low 81 Mg Tbec (Aspirin) .Marland Kitchen.. 1po Once Daily 19)  Fexofenadine Hcl 180 Mg Tabs (Fexofenadine Hcl) .Marland Kitchen.. 1po Once Daily As Needed Allergies 20)  Gabapentin 300 Mg Caps (Gabapentin) .Marland Kitchen.. 1 By Mouth Three Times A Day 21)  Nasonex 50 Mcg/act Susp (Mometasone Furoate) .... 2 Spray/side Per Day 22)  Furosemide 20 Mg Tabs (Furosemide) .Marland Kitchen.. 1 By Mouth Daily 23)  Methocarbamol 500 Mg Tabs (Methocarbamol) .Marland Kitchen.. 1 By Mouth At Bedtime 24)  Cyanocobalamin 1000 Mcg/ml Soln (Cyanocobalamin) .... Inject 1066mcg/ml Once A Month 25)  3ml 25g 5/8 (0.28mm X 16mm) .... Use As Directed  Allergies (verified): 1)  !  Valium 2)  ! Cardizem 3)  ! * Deltasone 4)  ! * Tequin 5)  ! * Lyrica 6)  ! * Amiodarone 7)  ! * Duragesic 8)  ! * Spironolactone 9)  Levaquin  Past History:  Past Medical History: Last updated: 11/02/2009 Anemia-iron deficiency GERDw/ stricture Hypertension B12 Deficiency Anticoagulation therapy Atrial fibrillation Hyperlipidemia Osteoporosis Pulmonary embolism, hx of hx of shingles - right facial renal cyst Diabetes mellitus, type II macular degeneration - dr Luciana Axe - s/p laser oct 2010 diabetic retinopathy -  dr Luciana Axe  -  s/p laser 2007 Renal insufficiency Myocardial infarction, hx of Coronary artery disease Allergic rhinitis  Past Surgical History: Last updated: 09/06/2007 Appendectomy Hysterectomy  Social History: Last updated: 06/21/2008 Never Smoked Alcohol use-no Retired with 2 daughters today Drug use-no Regular exercise-yes  Risk Factors: Exercise: yes (06/21/2008)  Risk Factors: Smoking Status: never (08/02/2007) Passive Smoke Exposure: no (06/21/2008)  Review of Systems       all otherwise negative per pt -    Physical Exam  General:  alert and well-developed.   Head:  normocephalic and atraumatic.   Eyes:  vision grossly intact, pupils equal, and pupils round.   Ears:  R ear normal and L ear normal.   Nose:  no  external deformity and no nasal discharge.   Mouth:  no gingival abnormalities and pharynx pink and moist.   Neck:  supple and no masses.   Lungs:  normal respiratory effort and normal breath sounds.   Heart:  normal rate and irregular rhythm.   Extremities:  no edema, no erythema    Impression & Recommendations:  Problem # 1:  HYPERTENSION (ICD-401.9)  The following medications were removed from the medication list:    Doxazosin Mesylate 2 Mg Tabs (Doxazosin mesylate) .Marland Kitchen... 1 by mouth at bedtime Her updated medication list for this problem includes:    Labetalol Hcl 200 Mg Tabs (Labetalol hcl) .Marland Kitchen... 2 by mouth two times a day    Catapres-tts-3 0.3 Mg/24hr Ptwk (Clonidine hcl) .Marland Kitchen... Apply once weekly    Felodipine 10 Mg Xr24h-tab (Felodipine) .Marland Kitchen... 1 by mouth daily    Furosemide 20 Mg Tabs (Furosemide) .Marland Kitchen... 1 by mouth daily  BP today: 98/48 Prior BP: 118/54 (12/14/2009)  Prior 10 Yr Risk Heart Disease: N/A (11/02/2009)  Labs Reviewed: K+: 5.3 (12/14/2009) Creat: : 1.90 (12/14/2009)   Chol: 130 (07/02/2009)   HDL: 35.00 (07/02/2009)   LDL: 76 (07/02/2009)   TG: 95.0 (07/02/2009) low - to stop the alpha blocker, f/u BP at home  Problem # 2:  OSTEOPOROSIS (ICD-733.00)  Her updated medication list for this problem includes:    Fosamax 70 Mg Tabs (Alendronate sodium) .Marland Kitchen... 1 tablet by mouth once a week pt prefers to hold on f/u dxa at this time due to pelvic problem, to consider prolia start if cost not prohibitive  Problem # 3:  DIABETES MELLITUS, TYPE II (ICD-250.00)  Her updated medication list for this problem includes:    Glimepiride 4 Mg Tabs (Glimepiride) .Marland Kitchen... Take 1 by mouth once daily only    Lantus 100 Unit/ml Soln (Insulin glargine) ..... Inject 8 unit subcutaneously once a day    Metformin Hcl 500 Mg Tabs (Metformin hcl) .Marland Kitchen... 1 by mouth bid    Aspir-low 81 Mg Tbec (Aspirin) .Marland Kitchen... 1po once daily  Labs Reviewed: Creat: 1.90 (12/14/2009)    Reviewed HgBA1c  results: 6.7 (07/02/2009)  5.9 (02/27/2009) stable overall by hx and exam, ok to continue meds/tx as is ,  declines furhter labs today  Problem # 4:  DIASTOLIC HEART FAILURE, CHRONIC (ICD-428.32)  Her updated medication list for this problem includes:    Labetalol Hcl 200 Mg Tabs (Labetalol hcl) .Marland Kitchen... 2 by mouth two times a day    Warfarin Sodium 5 Mg Tabs (Warfarin sodium) .Marland Kitchen... Take as directed by coumadin clinic.    Aspir-low 81 Mg Tbec (Aspirin) .Marland Kitchen... 1po once daily    Furosemide 20 Mg Tabs (Furosemide) .Marland Kitchen... 1 by mouth daily stable overall by hx and exam, ok to continue meds/tx as is   Complete Medication List: 1)  Accu-chek Compact Test Drum Strp (Glucose blood) .Marland Kitchen.. 1 two times a day use asd - dx code 250.02 2)  Labetalol Hcl 200 Mg Tabs (Labetalol hcl) .... 2 by mouth two times a day 3)  Catapres-tts-3 0.3 Mg/24hr Ptwk (Clonidine hcl) .... Apply once weekly 4)  Ferrous Sulfate 325 (65 Fe) Mg Tabs (Ferrous sulfate) .... Take 1 tablet by mouth twice a  day 5)  Fosamax 70 Mg Tabs (Alendronate sodium) .Marland Kitchen.. 1 tablet by mouth once a week 6)  Glimepiride 4 Mg Tabs (Glimepiride) .... Take 1 by mouth once daily only 7)  Lantus 100 Unit/ml Soln (Insulin glargine) .... Inject 8 unit subcutaneously once a day 8)  Metformin Hcl 500 Mg Tabs (Metformin hcl) .Marland Kitchen.. 1 by mouth bid 9)  Pantoprazole Sodium 40 Mg Tbec (Pantoprazole sodium) .Marland Kitchen.. 1po qd 10)  Warfarin Sodium 5 Mg Tabs (Warfarin sodium) .... Take as directed by coumadin clinic. 11)  Bd Insulin Syringe Ultrafine 31g X 5/16" 0.5 Ml Misc (Insulin syringe-needle u-100) .... Use 1 qd 12)  Sertraline Hcl 50 Mg Tabs (Sertraline hcl) .Marland Kitchen.. 1po once daily 13)  Felodipine 10 Mg Xr24h-tab (Felodipine) .Marland Kitchen.. 1 by mouth daily 14)  Hydrocodone-acetaminophen 5-325 Mg Tabs (Hydrocodone-acetaminophen) .Marland Kitchen.. 1po q 6 hrs as needed pain, and 1/2 by mouth q 3 hrs as needed breakthrough (per dr handy) 15)  Stool Softner  .... As needed 16)  Pravachol 20 Mg Tabs  (Pravastatin sodium) .... 1/2 tablet once daily 17)  Bactrim Ds 800-160 Mg Tabs (Sulfamethoxazole-trimethoprim) .Marland Kitchen.. 1po once daily 18)  Aspir-low 81 Mg Tbec (Aspirin) .Marland Kitchen.. 1po once daily 19)  Fexofenadine Hcl 180 Mg Tabs (Fexofenadine hcl) .Marland Kitchen.. 1po once daily as needed allergies 20)  Gabapentin 300 Mg Caps (Gabapentin) .Marland Kitchen.. 1 by mouth three times a day 21)  Nasonex 50 Mcg/act Susp (Mometasone furoate) .... 2 spray/side per day 22)  Furosemide 20 Mg Tabs (Furosemide) .Marland Kitchen.. 1 by mouth daily 23)  Methocarbamol 500 Mg Tabs (Methocarbamol) .Marland Kitchen.. 1 by mouth at bedtime 24)  Cyanocobalamin 1000 Mcg/ml Soln (Cyanocobalamin) .... Inject 1068mcg/ml once a month 25)  3ml 25g 5/8 (0.73mm X 16mm)  .... Use as directed  Other Orders: Vit B12 1000 mcg (J3420) Admin of Therapeutic Inj  intramuscular or subcutaneous (32355)  Patient Instructions: 1)  please stop the doxasozin for now 2)  You will be contacted about the referral(s) to: [facility] 3)  you had the B12 shot today 4)  Please schedule a follow-up appointment in 3 months.   Medication Administration  Injection # 1:    Medication: Vit B12 1000 mcg    Diagnosis: B12 DEFICIENCY (ICD-266.2)    Route: IM    Site: L deltoid    Exp Date: 10/2011    Lot #: [id number]    Mfr: American Regent    Given by: Zella Ball Ewing CMA (AAMA) (February 22, 2010 9:06 AM)  Orders Added: 1)  Vit B12 1000 mcg [J3420] 2)  Admin of Therapeutic Inj  intramuscular or subcutaneous [96372] 3)  Est. Patient Level IV [16109]

## 2010-10-03 NOTE — Miscellaneous (Signed)
Summary: Discharge/[facility]  Discharge/[facility]   Imported By: Lester Lorraine 02/06/2010 07:25:36  _____________________________________________________________________  External Attachment:    Type:   Image     Comment:   External Document

## 2010-10-03 NOTE — Medication Information (Signed)
Summary: rov/sp  Anticoagulant Therapy  Managed by: Lindsay Reams, Lindsay Sheppard, Lindsay Sheppard Referring MD: Linus Orn MD: Gala Romney MD, Reuel Boom Indication 1: Pulmonary embolus (ICD-415.19) Lab Used: [facility] Site: [address] INR POC 2.3 INR RANGE 2 - 3  Dietary changes: no    Health status changes: no    Bleeding/hemorrhagic complications: no    Recent/future hospitalizations: no    Any changes in medication regimen? no    Recent/future dental: no  Any missed doses?: no       Is patient compliant with meds? yes       Allergies: 1)  ! Valium 2)  ! Cardizem 3)  ! * Deltasone 4)  ! * Tequin 5)  ! * Lyrica 6)  ! * Amiodarone 7)  ! * Duragesic 8)  ! * Spironolactone 9)  Levaquin  Anticoagulation Management History:      The patient is taking warfarin and comes in today for a routine follow up visit.  Positive risk factors for bleeding include an age of 75 years or older and presence of serious comorbidities.  The bleeding index is 'intermediate risk'.  Positive CHADS2 values include History of CHF, History of HTN, Age > 84 years old, and History of Diabetes.  The start date was 03/26/2005.  Her last INR was 5.8 RATIO.  Anticoagulation responsible provider: Jaiya Mooradian MD, Reuel Boom.  INR POC: 2.3.  Cuvette Lot#: [id number].  Exp: 06/2011.    Anticoagulation Management Assessment/Plan:      The patient's current anticoagulation dose is Warfarin sodium 5 mg tabs: Take as directed by coumadin clinic..  The target INR is 2 - 3.  The next INR is due 04/29/2010.  Anticoagulation instructions were given to home health nurse.  Results were reviewed/authorized by Lindsay Reams, Lindsay Sheppard, Lindsay Sheppard.  She was notified by Lindsay Reams Lindsay Sheppard.         Prior Anticoagulation Instructions: INR 2.3  Continue same dose of 1/2 tablet every day except 1 tablet on Tuesday and Friday.    Current Anticoagulation Instructions: INR 2.3  Continue on same dosage 1/2 tablet daily except 1 tablet on  Mondays and Fridays.  Recheck in 3-4 weeks.

## 2010-10-03 NOTE — Assessment & Plan Note (Signed)
Summary: B12/[facility]/CD   Nurse Visit   Allergies: 1)  ! Valium 2)  ! Cardizem 3)  ! * Deltasone 4)  ! * Tequin 5)  ! * Lyrica 6)  ! * Amiodarone 7)  ! * Duragesic 8)  ! * Spironolactone 9)  Levaquin  Medication Administration  Injection # 1:    Medication: Vit B12 1000 mcg    Diagnosis: B12 DEFICIENCY (ICD-266.2)    Route: IM    Site: L deltoid    Exp Date: 05/03/2011    Lot #: [id number]    Mfr: American Regent    Patient tolerated injection without complications    Given by: Margaret Pyle, CMA (October 16, 2009 9:49 AM)  Orders Added: 1)  Vit B12 1000 mcg [J3420] 2)  Admin of Therapeutic Inj  intramuscular or subcutaneous [96372] 3)  Est. Patient Level I [96045]

## 2010-10-03 NOTE — Medication Information (Signed)
Summary: Coumadin Clinic  Anticoagulant Therapy  Managed by: Cloyde Reams, RN, BSN Referring MD: Linus Orn MD: Ladona Ridgel MD, Sharlot Gowda Indication 1: Pulmonary embolus (ICD-415.19) Lab Used: [facility] [place] Site: [address] PT 45.6 INR POC 3.8 INR RANGE 2 - 3    Bleeding/hemorrhagic complications: no     Any changes in medication regimen? yes       Details: D/C ASA, start Plendil 1/2 tab daily. D/C Robaxin   Any missed doses?: no       Is patient compliant with meds? yes       Allergies: 1)  ! Valium 2)  ! Cardizem 3)  ! * Deltasone 4)  ! * Tequin 5)  ! * Lyrica 6)  ! * Amiodarone 7)  ! * Duragesic 8)  ! * Spironolactone 9)  Levaquin  Anticoagulation Management History:      Her anticoagulation is being managed by telephone today.  Positive risk factors for bleeding include an age of 74 years or older and presence of serious comorbidities.  The bleeding index is 'intermediate risk'.  Positive CHADS2 values include History of CHF, History of HTN, Age > 28 years old, and History of Diabetes.  The start date was 03/26/2005.  Her last INR was 5.8 RATIO.  Prothrombin time is 45.6.  Anticoagulation responsible provider: Ladona Ridgel MD, Sharlot Gowda.  INR POC: 3.8.  Exp: 04/2011.    Anticoagulation Management Assessment/Plan:      The patient's current anticoagulation dose is Warfarin sodium 5 mg tabs: Take as directed by coumadin clinic..  The target INR is 2 - 3.  The next INR is due 03/18/2010.  Anticoagulation instructions were given to home health nurse.  Results were reviewed/authorized by Cloyde Reams, RN, BSN.  She was notified by Cloyde Reams RN.         Prior Anticoagulation Instructions: INR 3.7 Skip today then change dose 2.5mg s daily except 5mg s MWF. Recheck in 10 days. Orders given to [facility] while at home with pt.   Current Anticoagulation Instructions: INR 3.8  Spoke with Dennie Bible, [facility] advised to hold x 1 dose then start taking 2.5mg  daily except 5mg  on  Mondays and Fridays.  Recheck in 10 days. Pt being d/c from [facility], made f/u visit in clinic on 03/18/10 spoke with pt's daughter Kendal Hymen, aware of appt date and time.

## 2010-10-03 NOTE — Progress Notes (Signed)
Summary: Rx req  Phone Note Call from Patient   Caller: Daughter [phone] Summary of Call: Pt's daughter called stsating that pt was Rx'd Clonidine patches 0.2  but is was not woking well to control pt's BP. Daughter increased pt to 0.3 patch which is working well but they are now out. Daughter is requesting Rx to [facility], please advise. Initial call taken by: Margaret Pyle, CMA,  September 09, 2010 1:56 PM  Follow-up for Phone Call        ok for refill at the 0.3 - to robin to handle Follow-up by: Corwin Levins MD,  September 09, 2010 1:57 PM    New/Updated Medications: CLONIDINE HCL 0.3 MG/24HR PTWK (CLONIDINE HCL) 1 patch every week Prescriptions: CLONIDINE HCL 0.3 MG/24HR PTWK (CLONIDINE HCL) 1 patch every week  #12 x 3   Entered by:   Scharlene Gloss CMA (AAMA)   Authorized by:   Corwin Levins MD   Signed by:   Scharlene Gloss CMA (AAMA) on 09/09/2010   Method used:   Faxed to ...       [facility] (mail-order)             , [place]         Ph: [phone]       Fax: [phone]   RxID:   [id number]

## 2010-10-03 NOTE — Miscellaneous (Signed)
Summary: Plan of Care & Treatment/[facility]  Plan of Care & Treatment/[facility]   Imported By: Sherian Rein 12/07/2009 08:57:14  _____________________________________________________________________  External Attachment:    Type:   Image     Comment:   External Document

## 2010-10-03 NOTE — Letter (Signed)
Summary: [facility]   Imported By: Lester Manchester 03/27/2010 08:18:29  _____________________________________________________________________  External Attachment:    Type:   Image     Comment:   External Document

## 2010-10-03 NOTE — Assessment & Plan Note (Signed)
Summary: PER DAU 1 MTH B12   JWJ  STC   Nurse Visit   Allergies: 1)  ! Valium 2)  ! Cardizem 3)  ! * Deltasone 4)  ! * Tequin 5)  ! * Lyrica 6)  ! * Amiodarone 7)  ! * Duragesic 8)  ! * Spironolactone 9)  Levaquin  Medication Administration  Injection # 1:    Medication: Vit B12 1000 mcg    Diagnosis: B12 DEFICIENCY (ICD-266.2)    Route: IM    Site: R deltoid    Exp Date: 12/2010    Lot #: [id number]    Mfr: American Regent    Patient tolerated injection without complications    Given by: Brenton Grills CMA Duncan Dull) (August 29, 2010 9:41 AM)  Orders Added: 1)  Admin of Therapeutic Inj  intramuscular or subcutaneous [96372] 2)  Vit B12 1000 mcg [J3420]

## 2010-10-03 NOTE — Medication Information (Signed)
Summary: Coumadin Clinic  Anticoagulant Therapy  Managed by: Weston Brass, PharmD Supervising MD: Antoine Poche MD, Fayrene Fearing Indication 1: Pulmonary embolus (ICD-415.19) Lab Used: [facility] [place] Site: [address] PT 20.8 INR POC 1.7 INR RANGE 2 - 3  Dietary changes: no    Health status changes: no    Bleeding/hemorrhagic complications: no    Recent/future hospitalizations: yes       Details: discharged 6/2 from inpatient rehab  Any changes in medication regimen? yes       Details: on Keflex until today; will resume Bactrim chronically after that  Recent/future dental: no  Any missed doses?: yes     Details: missed dose last night   Is patient compliant with meds? yes       Allergies: 1)  ! Valium 2)  ! Cardizem 3)  ! * Deltasone 4)  ! * Tequin 5)  ! * Lyrica 6)  ! * Amiodarone 7)  ! * Duragesic 8)  ! * Spironolactone 9)  Levaquin  Anticoagulation Management History:      Her anticoagulation is being managed by telephone today.  Positive risk factors for bleeding include an age of 72 years or older and presence of serious comorbidities.  The bleeding index is 'intermediate risk'.  Positive CHADS2 values include History of CHF, History of HTN, Age > 18 years old, and History of Diabetes.  The start date was 03/26/2005.  Her last INR was 5.8 RATIO.  Prothrombin time is 20.8.  Anticoagulation responsible provider: Antoine Poche MD, Fayrene Fearing.  INR POC: 1.7.  Cuvette Lot#: [id number].    Anticoagulation Management Assessment/Plan:      The patient's current anticoagulation dose is Warfarin sodium 5 mg tabs: Take as directed by coumadin clinic..  The target INR is 2 - 3.  The next INR is due 02/11/2010.  Anticoagulation instructions were given to daughter/patient.  Results were reviewed/authorized by Weston Brass, PharmD.  She was notified by Weston Brass PharmD.        Coagulation management information includes: Pt's daughter Kendal Hymen- [phone].  Prior Anticoagulation  Instructions: INR 2.8  Continue taking 1/2 tablet on Monday and Friday and 1 tablet all other days.  Return to clinci in 4 weeks.    Current Anticoagulation Instructions: INR 1.7  Spoke with pt's daughter.  Take 1 tablet today then resume same dose of 1 tablet every day except 1/2 tablet on Monday and Friday.  Gave orders to [facility] with [facility].

## 2010-10-03 NOTE — Assessment & Plan Note (Signed)
Summary: 3 MO ROV /NWS---1 MTH B12 INJ ALSO---STC   Vital Signs:  Patient profile:   77 year old female Height:      65 inches Weight:      139.13 pounds BMI:     23.24 O2 Sat:      97 % on Room air Temp:     97.6 degrees F oral Pulse rate:   75 / minute BP sitting:   90 / 58  (left arm) Cuff size:   regular  Vitals Entered By: Zella Ball Ewing CMA (AAMA) (May 30, 2010 10:07 AM)  O2 Flow:  Room air  CC: 3 month ROV/RE   CC:  3 month ROV/RE.  History of Present Illness: here to f/u; overall as been doing well;  lsat saw ortho aug 29 and released as it seened she had no further pain and waswalkign wihtout walker with repstect to the pubic rami fracture;  pt today does c/o her "usual" chronic LBP with known lumbar disc dz without GU or bowel bladder or new LE pain.weak/numb., and controlled with her chronic vicodin.  Only taking the furosemide every other day for now without incr in the swelling and some improvement in recent dizziness as well.   BP has been on the lower side like today lately as well, and overall good complaicne with the clonidine higher dose. Pt denies CP, worsening sob, doe, wheezing, orthopnea, pnd, worsening LE edema, palps, or syncope  .  Pt denies new neuro symptoms such as headache, facial or extremity weakness  No fever, wt loss, night sweats, loss of appetite or other constitutional symptoms .  Pt denies polydipsia, polyuria, or low sugar symptoms such as shakiness improved with eating.  Overall good compliance with meds, trying to follow low chol, DM diet, wt stable, little excercise however   had prolia last july 25 with a b12 shot - due for next shot jan 2012  also had recent skin lesion nonhealing come up to the left nose area, mild red, nontender, non draining., not healng for over a month  also with sudden onset unexplained left hearing loss quite significant though states right ear is no change;  no earache, fever, ST, new or worsened sinus congestion,  vertigo, n/v, headache  Problems Prior to Update: 1)  Hyperkalemia  (ICD-276.7) 2)  Skin Lesion  (ICD-709.9) 3)  Other Specified Forms of Hearing Loss  (ICD-389.8) 4)  Atrial Fibrillation  (ICD-427.31) 5)  Diastolic Heart Failure, Chronic  (ICD-428.32) 6)  Recent Non-stemi  (ICD-412) 7)  Postherpetic Neuralgia  (ICD-053.19) 8)  Preventive Health Care  (ICD-V70.0) 9)  Allergic Rhinitis  (ICD-477.9) 10)  Coronary Artery Disease  (ICD-414.00) 11)  Renal Insufficiency  (ICD-588.9) 12)  Dysuria  (ICD-788.1) 13)  Laceration, Forehead  (ICD-873.42) 14)  Fatigue  (ICD-780.79) 15)  Blepharitis, Bilateral  (ICD-373.00) 16)  Postural Hypotension  (ICD-458.0) 17)  Syncope  (ICD-780.2) 18)  Depressive Disorder  (ICD-311) 19)  Constipation  (ICD-564.00) 20)  Uti  (ICD-599.0) 21)  Epigastric Tenderness  (ICD-789.66) 22)  Uti  (ICD-599.0) 23)  Renal Cyst, Left  (ICD-593.2) 24)  Abdominal Pain, Epigastric  (ICD-789.06) 25)  Dysuria  (ICD-788.1) 26)  Fatigue  (ICD-780.79) 27)  Diabetes Mellitus, Type II  (ICD-250.00) 28)  Back Pain  (ICD-724.5) 29)  Family History Diabetes 1st Degree Relative  (ICD-V18.0) 30)  Shingles, Hx of  (ICD-V13.8) 31)  Pulmonary Embolism, Hx of  (ICD-V12.51) 32)  Osteoporosis  (ICD-733.00) 33)  Hyperlipidemia  (ICD-272.4) 34)  Anticoagulation  Therapy  (ICD-V58.61) 35)  B12 Deficiency  (ICD-266.2) 36)  Hypertension  (ICD-401.9) 37)  Gerd  (ICD-530.81) 38)  Anemia-iron Deficiency  (ICD-280.9)  Medications Prior to Update: 1)  Accu-Chek Compact Test Drum  Strp (Glucose Blood) .Marland Kitchen.. 1 Two Times A Day Use Asd - Dx Code 250.02 2)  Labetalol Hcl 200 Mg  Tabs (Labetalol Hcl) .... 2 By Mouth Two Times A Day 3)  Catapres-Tts-3 0.3 Mg/24hr Ptwk (Clonidine Hcl) .... Apply Once Weekly 4)  Ferrous Sulfate 325 (65 Fe) Mg Tabs (Ferrous Sulfate) .... Take 1 Tablet By Mouth Twice A  Day 5)  Fosamax 70 Mg Tabs (Alendronate Sodium) .Marland Kitchen.. 1 Tablet By Mouth Once A Week 6)  Glimepiride  4 Mg Tabs (Glimepiride) .... Take 1 By Mouth Once Daily Only 7)  Lantus 100 Unit/ml Soln (Insulin Glargine) .... Inject 8 Unit Subcutaneously Once A Day 8)  Metformin Hcl 500 Mg Tabs (Metformin Hcl) .Marland Kitchen.. 1 By Mouth Bid 9)  Pantoprazole Sodium 40 Mg  Tbec (Pantoprazole Sodium) .Marland Kitchen.. 1po Qd 10)  Warfarin Sodium 5 Mg Tabs (Warfarin Sodium) .... Take As Directed By Coumadin Clinic. 11)  Bd Insulin Syringe Ultrafine 31g X 5/16" 0.5 Ml  Misc (Insulin Syringe-Needle U-100) .... Use 1 Qd 12)  Sertraline Hcl 50 Mg  Tabs (Sertraline Hcl) .Marland Kitchen.. 1po Once Daily 13)  Hydrocodone-Acetaminophen 5-325 Mg Tabs (Hydrocodone-Acetaminophen) .... Taking 1/2-1 Tablet Daily 14)  Stool Softner .... As Needed 15)  Pravachol 20 Mg Tabs (Pravastatin Sodium) .... 1/2 Tablet Once Daily 16)  Bactrim Ds 800-160 Mg Tabs (Sulfamethoxazole-Trimethoprim) .Marland Kitchen.. 1po Once Daily 17)  Fexofenadine Hcl 180 Mg Tabs (Fexofenadine Hcl) .Marland Kitchen.. 1po Once Daily As Needed Allergies 18)  Gabapentin 300 Mg Caps (Gabapentin) .Marland Kitchen.. 1 By Mouth Three Times A Day 19)  Nasonex 50 Mcg/act Susp (Mometasone Furoate) .... 2 Spray/side Per Day 20)  Furosemide 20 Mg Tabs (Furosemide) .Marland Kitchen.. 1 By Mouth Daily 21)  Cyanocobalamin 1000 Mcg/ml Soln (Cyanocobalamin) .... Inject 1074mcg/ml Once A Month 22)  3ml 25g 5/8 (0.43mm X 16mm) .... Use As Directed 23)  Prolia 60 Mg/ml Soln (Denosumab) .... Q 53mo 24)  Tramadol Hcl 50 Mg Tabs (Tramadol Hcl) .... As Needed  Current Medications (verified): 1)  Accu-Chek Compact Test Drum  Strp (Glucose Blood) .Marland Kitchen.. 1 Two Times A Day Use Asd - Dx Code 250.02 2)  Labetalol Hcl 200 Mg  Tabs (Labetalol Hcl) .... 2 By Mouth Two Times A Day 3)  Catapres-Tts-2 0.2 Mg/24hr Ptwk (Clonidine Hcl) .Marland Kitchen.. 1 Patch Q Wk 4)  Ferrous Sulfate 325 (65 Fe) Mg Tabs (Ferrous Sulfate) .... Take 1 Tablet By Mouth Twice A  Day 5)  Fosamax 70 Mg Tabs (Alendronate Sodium) .Marland Kitchen.. 1 Tablet By Mouth Once A Week 6)  Glimepiride 4 Mg Tabs (Glimepiride) .... Take 1 By  Mouth Once Daily Only 7)  Lantus 100 Unit/ml Soln (Insulin Glargine) .... Inject 8 Unit Subcutaneously Once A Day 8)  Metformin Hcl 500 Mg Tabs (Metformin Hcl) .Marland Kitchen.. 1 By Mouth Bid 9)  Pantoprazole Sodium 40 Mg  Tbec (Pantoprazole Sodium) .Marland Kitchen.. 1po Qd 10)  Warfarin Sodium 5 Mg Tabs (Warfarin Sodium) .... Take As Directed By Coumadin Clinic. 11)  Bd Insulin Syringe Ultrafine 31g X 5/16" 0.5 Ml  Misc (Insulin Syringe-Needle U-100) .... Use 1 Qd 12)  Sertraline Hcl 50 Mg  Tabs (Sertraline Hcl) .Marland Kitchen.. 1po Once Daily 13)  Hydrocodone-Acetaminophen 5-325 Mg Tabs (Hydrocodone-Acetaminophen) .... Taking 1/2-1 Tablet Daily 14)  Stool Softner .... As Needed 15)  Pravachol  20 Mg Tabs (Pravastatin Sodium) .... 1/2 Tablet Once Daily 16)  Bactrim Ds 800-160 Mg Tabs (Sulfamethoxazole-Trimethoprim) .Marland Kitchen.. 1po Once Daily 17)  Fexofenadine Hcl 180 Mg Tabs (Fexofenadine Hcl) .Marland Kitchen.. 1po Once Daily As Needed Allergies 18)  Gabapentin 300 Mg Caps (Gabapentin) .Marland Kitchen.. 1 By Mouth Three Times A Day 19)  Nasonex 50 Mcg/act Susp (Mometasone Furoate) .... 2 Spray/side Per Day 20)  Furosemide 20 Mg Tabs (Furosemide) .Marland Kitchen.. 1 By Mouth Every Other Day 21)  Cyanocobalamin 1000 Mcg/ml Soln (Cyanocobalamin) .... Inject 1051mcg/ml Once A Month 22)  3ml 25g 5/8 (0.86mm X 16mm) .... Use As Directed 23)  Prolia 60 Mg/ml Soln (Denosumab) .... Q 24mo 24)  Tramadol Hcl 50 Mg Tabs (Tramadol Hcl) .... As Needed  Allergies (verified): 1)  ! Valium 2)  ! Cardizem 3)  ! * Deltasone 4)  ! * Tequin 5)  ! * Lyrica 6)  ! * Amiodarone 7)  ! * Duragesic 8)  ! * Spironolactone 9)  Levaquin  Past History:  Past Medical History: Last updated: 11/02/2009 Anemia-iron deficiency GERDw/ stricture Hypertension B12 Deficiency Anticoagulation therapy Atrial fibrillation Hyperlipidemia Osteoporosis Pulmonary embolism, hx of hx of shingles - right facial renal cyst Diabetes mellitus, type II macular degeneration - dr Luciana Axe - s/p laser oct  2010 diabetic retinopathy -  dr Luciana Axe  -  s/p laser 2007 Renal insufficiency Myocardial infarction, hx of Coronary artery disease Allergic rhinitis  Past Surgical History: Last updated: 09/06/2007 Appendectomy Hysterectomy  Social History: Last updated: 06/21/2008 Never Smoked Alcohol use-no Retired with 2 daughters today Drug use-no Regular exercise-yes  Risk Factors: Exercise: yes (06/21/2008)  Risk Factors: Smoking Status: never (08/02/2007) Passive Smoke Exposure: no (06/21/2008)  Review of Systems       all otherwise negative per pt -    Physical Exam  General:  alert and well-developed for her age but frail Head:  normocephalic and atraumatic.   Eyes:  vision grossly intact, pupils equal, and pupils round.   Ears:  R ear normal and L ear normal.   Nose:  no external deformity and no nasal discharge.   Mouth:  no gingival abnormalities and pharynx pink and moist.   Neck:  supple and no masses.   Lungs:  normal respiratory effort and normal breath sounds.   Heart:  normal rate and irregular rhythm.   Abdomen:  soft, non-tender, and normal bowel sounds.   Msk:  no flank tender, has chronic tender to low lumbar midline and paravertebral areas Extremities:  no edema, no erythema  Neurologic:  alert & oriented X3 and strength normal in all extremities for her ability Skin:  left nasal area with < 1/2 cm skin lesion erythem, nonulcerated   Impression & Recommendations:  Problem # 1:  HYPERTENSION (ICD-401.9)  Her updated medication list for this problem includes:    Labetalol Hcl 200 Mg Tabs (Labetalol hcl) .Marland Kitchen... 2 by mouth two times a day    Catapres-tts-2 0.2 Mg/24hr Ptwk (Clonidine hcl) .Marland Kitchen... 1 patch q wk    Furosemide 20 Mg Tabs (Furosemide) .Marland Kitchen... 1 by mouth every other day overcontrolled - ok to decrease the catapress above  BP today: 90/58 Prior BP: 98/48 (02/22/2010)  Prior 10 Yr Risk Heart Disease: N/A (11/02/2009)  Labs Reviewed: K+: 5.3  (12/14/2009) Creat: : 1.90 (12/14/2009)   Chol: 130 (07/02/2009)   HDL: 35.00 (07/02/2009)   LDL: 76 (07/02/2009)   TG: 95.0 (07/02/2009)  Problem # 2:  DIABETES MELLITUS, TYPE II (ICD-250.00) Assessment: Deteriorated  Her updated medication list for this problem includes:    Glimepiride 4 Mg Tabs (Glimepiride) .Marland Kitchen... Take 1 by mouth once daily only    Lantus 100 Unit/ml Soln (Insulin glargine) ..... Inject 8 unit subcutaneously once a day    Metformin Hcl 500 Mg Tabs (Metformin hcl) .Marland Kitchen... 1 by mouth bid  Labs Reviewed: Creat: 1.90 (12/14/2009)    Reviewed HgBA1c results: 6.7 (07/02/2009)  5.9 (02/27/2009) stable overall by hx and exam, ok to continue meds/tx as is , Pt to cont DM diet, excercise, wt loss efforts; to check labs today   Orders: TLB-BMP (Basic Metabolic Panel-BMET) (80048-METABOL) TLB-A1C / Hgb A1C (Glycohemoglobin) (83036-A1C) TLB-Lipid Panel (80061-LIPID)  Problem # 3:  BACK PAIN (ICD-724.5)  Her updated medication list for this problem includes:    Hydrocodone-acetaminophen 5-325 Mg Tabs (Hydrocodone-acetaminophen) .Marland Kitchen... Taking 1/2-1 tablet daily    Tramadol Hcl 50 Mg Tabs (Tramadol hcl) .Marland Kitchen... As needed chronic stable with mild left hip flexion weakness  - ok to Continue all previous medications as before this visit   Problem # 4:  OTHER SPECIFIED FORMS OF HEARING LOSS (ICD-389.8) no wax impaction left ear but recent marked hearing loss unclear etiolgoy -;  she plans to self refer to ent  Problem # 5:  SKIN LESION (ICD-709.9) left nose - prob skin ca - for derm eval (has appt oct 6)  Complete Medication List: 1)  Accu-chek Compact Test Drum Strp (Glucose blood) .Marland Kitchen.. 1 two times a day use asd - dx code 250.02 2)  Labetalol Hcl 200 Mg Tabs (Labetalol hcl) .... 2 by mouth two times a day 3)  Catapres-tts-2 0.2 Mg/24hr Ptwk (Clonidine hcl) .Marland Kitchen.. 1 patch q wk 4)  Ferrous Sulfate 325 (65 Fe) Mg Tabs (Ferrous sulfate) .... Take 1 tablet by mouth twice a  day 5)   Fosamax 70 Mg Tabs (Alendronate sodium) .Marland Kitchen.. 1 tablet by mouth once a week 6)  Glimepiride 4 Mg Tabs (Glimepiride) .... Take 1 by mouth once daily only 7)  Lantus 100 Unit/ml Soln (Insulin glargine) .... Inject 8 unit subcutaneously once a day 8)  Metformin Hcl 500 Mg Tabs (Metformin hcl) .Marland Kitchen.. 1 by mouth bid 9)  Pantoprazole Sodium 40 Mg Tbec (Pantoprazole sodium) .Marland Kitchen.. 1po qd 10)  Warfarin Sodium 5 Mg Tabs (Warfarin sodium) .... Take as directed by coumadin clinic. 11)  Bd Insulin Syringe Ultrafine 31g X 5/16" 0.5 Ml Misc (Insulin syringe-needle u-100) .... Use 1 qd 12)  Sertraline Hcl 50 Mg Tabs (Sertraline hcl) .Marland Kitchen.. 1po once daily 13)  Hydrocodone-acetaminophen 5-325 Mg Tabs (Hydrocodone-acetaminophen) .... Taking 1/2-1 tablet daily 14)  Stool Softner  .... As needed 15)  Pravachol 20 Mg Tabs (Pravastatin sodium) .... 1/2 tablet once daily 16)  Bactrim Ds 800-160 Mg Tabs (Sulfamethoxazole-trimethoprim) .Marland Kitchen.. 1po once daily 17)  Fexofenadine Hcl 180 Mg Tabs (Fexofenadine hcl) .Marland Kitchen.. 1po once daily as needed allergies 18)  Gabapentin 300 Mg Caps (Gabapentin) .Marland Kitchen.. 1 by mouth three times a day 19)  Nasonex 50 Mcg/act Susp (Mometasone furoate) .... 2 spray/side per day 20)  Furosemide 20 Mg Tabs (Furosemide) .Marland Kitchen.. 1 by mouth every other day 21)  Cyanocobalamin 1000 Mcg/ml Soln (Cyanocobalamin) .... Inject 1077mcg/ml once a month 22)  3ml 25g 5/8 (0.35mm X 16mm)  .... Use as directed 23)  Prolia 60 Mg/ml Soln (Denosumab) .... Q 92mo 24)  Tramadol Hcl 50 Mg Tabs (Tramadol hcl) .... As needed  Other Orders: Flu Vaccine 12yrs + MEDICARE PATIENTS (J8119) Administration Flu vaccine - MCR (G0008)  Admin of patients own med IM/SQ [phone])  Patient Instructions: 1)  you had the B12 and flu shot today 2)  please plan on next prolia shot in jan 2012 3)  decrease the catapress to the 0.2 mg per wk 4)  Please go to the Lab in the basement for your blood and/or urine tests today 5)  Please call the number on  the [facility] Card for results of your testing 6)  Please keep your appt with Dermatology as you have planned 7)  Please see ENT at your convenience for the hearing loss to the left ear 8)  Continue all previous medications as before this visit  9)  Please schedule a follow-up appointment in 6 months for the "yearly medicare exam" Prescriptions: CATAPRES-TTS-2 0.2 MG/24HR PTWK (CLONIDINE HCL) 1 patch q wk  #12 x 3   Entered and Authorized by:   Corwin Levins MD   Signed by:   Corwin Levins MD on 05/30/2010   Method used:   Faxed to ...       [facility] (mail-order)             , [place]         Ph: [phone]       Fax: [phone]   RxID:   [id number]     Flu Vaccine Consent Questions     Do you have a history of severe allergic reactions to this vaccine? no    Any prior history of allergic reactions to egg and/or gelatin? no    Do you have a sensitivity to the preservative Thimersol? no    Do you have a past history of Guillan-Barre Syndrome? no    Do you currently have an acute febrile illness? no    Have you ever had a severe reaction to latex? no    Vaccine information given and explained to patient? yes    Are you currently pregnant? no    Lot Number:[id number]   Exp Date:03/01/2011   Site Given  Left Deltoid IMlu   Medication Administration  Injection # 1:    Medication: Vit B12 1000 mcg    Diagnosis: B12 DEFICIENCY (ICD-266.2)    Route: IM    Site: R deltoid    Exp Date: 12/2010    Lot #: [id number]    Mfr: [employer] Regent    Patient tolerated injection without complications    Given by: Zella Ball Ewing CMA (AAMA) (May 30, 2010 10:12 AM)  Orders Added: 1)  Flu Vaccine 32yrs + MEDICARE PATIENTS [Q2039] 2)  Administration Flu vaccine - MCR [G0008] 3)  Admin of patients own med IM/SQ [96372M] 4)  TLB-BMP (Basic Metabolic Panel-BMET) [80048-METABOL] 5)  TLB-A1C / Hgb A1C (Glycohemoglobin) [83036-A1C] 6)  TLB-Lipid Panel [80061-LIPID] 7)  Est. Patient Level IV [44010]

## 2010-10-03 NOTE — Medication Information (Signed)
Summary: rov/cb  Anticoagulant Therapy  Managed by: Weston Brass, PharmD Supervising MD: Gala Romney MD, Reuel Boom Indication 1: Pulmonary embolus (ICD-415.19) Lab Used: [facility] [place] Site: [address] INR POC 2.6 INR RANGE 2 - 3  Dietary changes: no    Health status changes: no    Bleeding/hemorrhagic complications: no    Recent/future hospitalizations: no    Any changes in medication regimen? no    Recent/future dental: no  Any missed doses?: no       Is patient compliant with meds? yes       Allergies: 1)  ! Valium 2)  ! Cardizem 3)  ! * Deltasone 4)  ! * Tequin 5)  ! * Lyrica 6)  ! * Amiodarone 7)  ! * Duragesic 8)  ! * Spironolactone 9)  Levaquin  Anticoagulation Management History:      The patient is taking warfarin and comes in today for a routine follow up visit.  Positive risk factors for bleeding include an age of 75 years or older and presence of serious comorbidities.  The bleeding index is 'intermediate risk'.  Positive CHADS2 values include History of CHF, History of HTN, Age > 60 years old, and History of Diabetes.  The start date was 03/26/2005.  Her last INR was 5.8 RATIO.  Anticoagulation responsible provider: Bensimhon MD, Reuel Boom.  INR POC: 2.6.  Exp: 12/2010.    Anticoagulation Management Assessment/Plan:      The patient's current anticoagulation dose is Warfarin sodium 5 mg tabs: Take as directed by coumadin clinic..  The target INR is 2 - 3.  The next INR is due 01/17/2010.  Anticoagulation instructions were given to daughter/patient.  Results were reviewed/authorized by Weston Brass, PharmD.  She was notified by Weston Brass PharmD.         Prior Anticoagulation Instructions: INR 1.9. Take 1 tablet daily except 0.5 tablet on Mon and Fri.  Current Anticoagulation Instructions: INR 2.6  Continue same dose of 1 tablet every day except 1/2 tablet on Monday and Friday

## 2010-10-03 NOTE — Assessment & Plan Note (Signed)
Summary: b12 shot/pt will bring her own b12/Louine Tenpenny/cd   Nurse Visit   Allergies: 1)  ! Valium 2)  ! Cardizem 3)  ! * Deltasone 4)  ! * Tequin 5)  ! * Lyrica 6)  ! * Amiodarone 7)  ! * Duragesic 8)  ! * Spironolactone 9)  Levaquin  Medication Administration  Injection # 1:    Medication: Vit B12 1000 mcg    Route: IM    Site: R deltoid    Exp Date: 12/01/2010    Lot #: [id number]    Mfr: American Regent    Patient tolerated injection without complications    Given by: Brenton Grills MA (March 25, 2010 9:20 AM)  Injection # 2:    Medication: Prolia 60mg     Diagnosis: OSTEOPOROSIS (ICD-733.00)    Route: SQ    Site: R arm    Exp Date: 08/02/2011    Lot #: [id number]    Mfr: [employer]    Patient tolerated injection without complications    Given by: Brenton Grills MA (March 25, 2010 9:26 AM)  Orders Added: 1)  Admin of patients own med IM/SQ [96372M] 2)  Prolia 60mg  [J3590] 3)  Admin of Therapeutic Inj  intramuscular or subcutaneous [98119]

## 2010-10-03 NOTE — Progress Notes (Signed)
Summary: Pain management  Phone Note From Other Clinic   Caller: PT - [facility] [phone] Summary of Call: Pt is scheduled for f/u w/ortho regarding recent fractures. Pt has been managing pain w/meds she has. Tramadol 1 q 6 hrs and 1 tylenol 500mg  q 6 hrs. This is not helping to manage pain. Please advise. Will Dr Jonny Ruiz follow pt for pain management?  Initial call taken by: Lamar Sprinkles, CMA,  February 08, 2010 11:24 AM  Follow-up for Phone Call        Patient daughter Kendal Hymen called to re-iterate her mothers need for pain med/management.  Call her at [phone]. Follow-up by: Lucious Groves,  February 08, 2010 11:35 AM  Additional Follow-up for Phone Call Additional follow up Details #1::        I understand pt had recent fall with pelvic fracture as well may 20;  I can provide generic vicodin for now;  does the pt want this, and does pt want to be referred to pain clinic (I can do that as well) Additional Follow-up by: Corwin Levins MD,  February 08, 2010 1:03 PM    Additional Follow-up for Phone Call Additional follow up Details #2::    Kendal Hymen notified and request the office send vicodin to [facility] [address] [facility] [place].Lucious Groves,  February 08, 2010 2:47 PM  Additional Follow-up for Phone Call Additional follow up Details #3:: Details for Additional Follow-up Action Taken: done hardcopy to LIM side B - dahlia  Additional Follow-up by: Corwin Levins MD,  February 08, 2010 2:57 PM  New/Updated Medications: HYDROCODONE-ACETAMINOPHEN 5-325 MG TABS (HYDROCODONE-ACETAMINOPHEN) 1po q 6 hrs as needed pain Prescriptions: HYDROCODONE-ACETAMINOPHEN 5-325 MG TABS (HYDROCODONE-ACETAMINOPHEN) 1po q 6 hrs as needed pain  #60 x 2   Entered and Authorized by:   Corwin Levins MD   Signed by:   Corwin Levins MD on 02/08/2010   Method used:   Print then Give to Patient   RxID:   [id number]  done hardcopy to LIM side B - dahlia  Corwin Levins MD  February 08, 2010 2:58 PM   Faxed hardcopy to pharmacy February 08, 2010

## 2010-10-03 NOTE — Medication Information (Signed)
Summary: rov/cs   Anticoagulant Therapy  Managed by: Reina Fuse, PharmD Referring MD: Lindsay Sheppard PCP: Lindsay Levins MD Supervising MD: Excell Seltzer MD, Casimiro Needle Indication 1: Pulmonary embolus (ICD-415.19) Lab Used: [facility] [place] Site: [address] INR POC 2.1 INR RANGE 2 - 3  Dietary changes: no    Health status changes: no    Bleeding/hemorrhagic complications: no    Recent/future hospitalizations: no    Any changes in medication regimen? no    Recent/future dental: no  Any missed doses?: no       Is patient compliant with meds? yes       Allergies: 1)  ! Valium 2)  ! Cardizem 3)  ! * Deltasone 4)  ! * Tequin 5)  ! * Lyrica 6)  ! * Amiodarone 7)  ! * Duragesic 8)  ! * Spironolactone 9)  Levaquin  Anticoagulation Management History:      The patient is taking warfarin and comes in today for a routine follow up visit.  Positive risk factors for bleeding include an age of 75 years or older and presence of serious comorbidities.  The bleeding index is 'intermediate risk'.  Positive CHADS2 values include History of CHF, History of HTN, Age > 54 years old, and History of Diabetes.  The start date was 03/26/2005.  Her last INR was 5.8 RATIO.  Anticoagulation responsible provider: Excell Seltzer MD, Casimiro Needle.  INR POC: 2.1.  Cuvette Lot#: [id number].  Exp: 07/2011.    Anticoagulation Management Assessment/Plan:      The patient's current anticoagulation dose is Warfarin sodium 5 mg tabs: Take as directed by coumadin clinic..  The target INR is 2 - 3.  The next INR is due 08/19/2010.  Anticoagulation instructions were given to patient/daughters.  Results were reviewed/authorized by Reina Fuse, PharmD.  She was notified by Reina Fuse PharmD.         Prior Anticoagulation Instructions: INR 2.4  Continue Coumadin as scheduled:  1 tablet on Monday, Wednesday, Friday, and Saturday, and 1/2 tablet on Sunday, Tuesday, and Thurdsay.  Return to clinic in 3 weeks.   Current  Anticoagulation Instructions: INR 2.1  Continue taking Coumadin 0.5 tab (2.5 mg) on Sun, Tues, Thur and Coumadin 1 tab (5 mg) on Mon, Wed, Fri, Sat. Return to clinic in 4 weeks.

## 2010-10-03 NOTE — Letter (Signed)
Summary: [facility]   Imported By: Sherian Rein 10/03/2009 09:11:28  _____________________________________________________________________  External Attachment:    Type:   Image     Comment:   External Document

## 2010-10-03 NOTE — Assessment & Plan Note (Signed)
Summary: B-12 Lindsay Sheppard Natale Milch   Nurse Visit   Allergies: 1)  ! Valium 2)  ! Cardizem 3)  ! * Deltasone 4)  ! * Tequin 5)  ! * Lyrica 6)  ! * Amiodarone 7)  ! * Duragesic 8)  ! * Spironolactone 9)  Levaquin  Medication Administration  Injection # 1:    Medication: Vit B12 1000 mcg    Diagnosis: B12 DEFICIENCY (ICD-266.2)    Route: IM    Site: R deltoid    Exp Date: 09/02/2011    Lot #: [id number]    Mfr: American Regent    Patient tolerated injection without complications    Given by: Margaret Pyle, CMA (Jan 17, 2010 10:09 AM)  Orders Added: 1)  Admin of Therapeutic Inj  intramuscular or subcutaneous [96372] 2)  Vit B12 1000 mcg [J3420]

## 2010-10-03 NOTE — Letter (Signed)
Summary: [facility]   Imported By: Lester [place] 12/04/2009 07:20:19  _____________________________________________________________________  External Attachment:    Type:   Image     Comment:   External Document

## 2010-10-03 NOTE — Medication Information (Signed)
Summary: [facility]   Anticoagulant Therapy  Managed by: Weston Brass, PharmD Referring MD: Graciela Husbands PCP: Corwin Levins MD Supervising MD: Graciela Husbands MD, Viviann Spare Indication 1: Pulmonary embolus (ICD-415.19) Lab Used: [facility] [place] Site: [address] INR POC 2.7 INR RANGE 2 - 3  Dietary changes: no    Health status changes: no    Bleeding/hemorrhagic complications: no    Recent/future hospitalizations: no    Any changes in medication regimen? yes       Details: started Cipro x 3 days for UTI yesterday  Recent/future dental: no  Any missed doses?: no       Is patient compliant with meds? yes       Allergies: 1)  ! Valium 2)  ! Cardizem 3)  ! * Deltasone 4)  ! * Tequin 5)  ! * Lyrica 6)  ! * Amiodarone 7)  ! * Duragesic 8)  ! * Spironolactone 9)  Levaquin  Anticoagulation Management History:      The patient is taking warfarin and comes in today for a routine follow up visit.  Positive risk factors for bleeding include an age of 75 years or older and presence of serious comorbidities.  The bleeding index is 'intermediate risk'.  Positive CHADS2 values include History of CHF, History of HTN, Age > 37 years old, and History of Diabetes.  The start date was 03/26/2005.  Her last INR was 5.8 RATIO.  Anticoagulation responsible provider: Graciela Husbands MD, Viviann Spare.  INR POC: 2.7.  Exp: 07/2011.    Anticoagulation Management Assessment/Plan:      The patient's current anticoagulation dose is Warfarin sodium 5 mg tabs: Take as directed by coumadin clinic..  The target INR is 2 - 3.  The next INR is due 07/01/2010.  Anticoagulation instructions were given to patient/daughters.  Results were reviewed/authorized by Weston Brass, PharmD.  She was notified by Weston Brass PharmD.         Prior Anticoagulation Instructions: INR 3.3  Skip today's dose, then resume normal Coumadin schedule:  1 tablet every day of the week, except 1/2 tablet on Sunday, Tuesday, and Thursday.  Return  to clinic in 2-3 weeks.    Current Anticoagulation Instructions: INR 2.7  Continue same dose of 1 tablet every day except 1/2 tablet on Sunday, Tuesday and Thursday.  Recheck INR in 2 weeks.

## 2010-10-03 NOTE — Medication Information (Signed)
Summary: rov/ewj  Anticoagulant Therapy  Managed by: Weston Brass, PharmD Referring MD: Linus Orn MD: Antoine Poche MD, Fayrene Fearing Indication 1: Pulmonary embolus (ICD-415.19) Lab Used: [facility] [place] Site: [address] INR POC 2.3 INR RANGE 2 - 3  Dietary changes: no    Health status changes: yes       Details: BP running on the low side.  Pt has some dizziness  Bleeding/hemorrhagic complications: no    Recent/future hospitalizations: no    Any changes in medication regimen? yes       Details: stopped methocarbomol, aspirin and BP med  Recent/future dental: no  Any missed doses?: no       Is patient compliant with meds? yes       Current Medications (verified): 1)  Accu-Chek Compact Test Drum  Strp (Glucose Blood) .Marland Kitchen.. 1 Two Times A Day Use Asd - Dx Code 250.02 2)  Labetalol Hcl 200 Mg  Tabs (Labetalol Hcl) .... 2 By Mouth Two Times A Day 3)  Catapres-Tts-3 0.3 Mg/24hr Ptwk (Clonidine Hcl) .... Apply Once Weekly 4)  Ferrous Sulfate 325 (65 Fe) Mg Tabs (Ferrous Sulfate) .... Take 1 Tablet By Mouth Twice A  Day 5)  Fosamax 70 Mg Tabs (Alendronate Sodium) .Marland Kitchen.. 1 Tablet By Mouth Once A Week 6)  Glimepiride 4 Mg Tabs (Glimepiride) .... Take 1 By Mouth Once Daily Only 7)  Lantus 100 Unit/ml Soln (Insulin Glargine) .... Inject 8 Unit Subcutaneously Once A Day 8)  Metformin Hcl 500 Mg Tabs (Metformin Hcl) .Marland Kitchen.. 1 By Mouth Bid 9)  Pantoprazole Sodium 40 Mg  Tbec (Pantoprazole Sodium) .Marland Kitchen.. 1po Qd 10)  Warfarin Sodium 5 Mg Tabs (Warfarin Sodium) .... Take As Directed By Coumadin Clinic. 11)  Bd Insulin Syringe Ultrafine 31g X 5/16" 0.5 Ml  Misc (Insulin Syringe-Needle U-100) .... Use 1 Qd 12)  Sertraline Hcl 50 Mg  Tabs (Sertraline Hcl) .Marland Kitchen.. 1po Once Daily 13)  Hydrocodone-Acetaminophen 5-325 Mg Tabs (Hydrocodone-Acetaminophen) .Marland Kitchen.. 1po Q 6 Hrs As Needed Pain, and 1/2 By Mouth Q 3 Hrs As Needed Breakthrough (Per Dr Carola Frost) 14)  Stool Softner .... As Needed 15)   Pravachol 20 Mg Tabs (Pravastatin Sodium) .... 1/2 Tablet Once Daily 16)  Bactrim Ds 800-160 Mg Tabs (Sulfamethoxazole-Trimethoprim) .Marland Kitchen.. 1po Once Daily 17)  Fexofenadine Hcl 180 Mg Tabs (Fexofenadine Hcl) .Marland Kitchen.. 1po Once Daily As Needed Allergies 18)  Gabapentin 300 Mg Caps (Gabapentin) .Marland Kitchen.. 1 By Mouth Three Times A Day 19)  Nasonex 50 Mcg/act Susp (Mometasone Furoate) .... 2 Spray/side Per Day 20)  Furosemide 20 Mg Tabs (Furosemide) .Marland Kitchen.. 1 By Mouth Daily 21)  Cyanocobalamin 1000 Mcg/ml Soln (Cyanocobalamin) .... Inject 1048mcg/ml Once A Month 22)  3ml 25g 5/8 (0.74mm X 16mm) .... Use As Directed 23)  Prolia 60 Mg/ml Soln (Denosumab) .... Q 21mo  Allergies: 1)  ! Valium 2)  ! Cardizem 3)  ! * Deltasone 4)  ! * Tequin 5)  ! * Lyrica 6)  ! * Amiodarone 7)  ! * Duragesic 8)  ! * Spironolactone 9)  Levaquin  Anticoagulation Management History:      The patient is taking warfarin and comes in today for a routine follow up visit.  Positive risk factors for bleeding include an age of 75 years or older and presence of serious comorbidities.  The bleeding index is 'intermediate risk'.  Positive CHADS2 values include History of CHF, History of HTN, Age > 75 years old, and History of Diabetes.  The start date  was 03/26/2005.  Her last INR was 5.8 RATIO.  Anticoagulation responsible provider: Antoine Poche MD, Fayrene Fearing.  INR POC: 2.3.  Exp: 04/2011.    Anticoagulation Management Assessment/Plan:      The patient's current anticoagulation dose is Warfarin sodium 5 mg tabs: Take as directed by coumadin clinic..  The target INR is 2 - 3.  The next INR is due 04/01/2010.  Anticoagulation instructions were given to home health nurse.  Results were reviewed/authorized by Weston Brass, PharmD.  She was notified by Weston Brass PharmD.         Prior Anticoagulation Instructions: INR 3.8  Spoke with Dennie Bible, [facility] advised to hold x 1 dose then start taking 2.5mg  daily except 5mg  on Mondays and Fridays.  Recheck in 10 days. Pt  being d/c from [facility], made f/u visit in clinic on 03/18/10 spoke with pt's daughter Kendal Hymen, aware of appt date and time.   Current Anticoagulation Instructions: INR 2.3  Continue same dose of 1/2 tablet every day except 1 tablet on Tuesday and Friday.

## 2010-10-03 NOTE — Medication Information (Signed)
Summary: rov/sp  Anticoagulant Therapy  Managed by: Eda Keys, PharmD Supervising MD: Gala Romney MD, Reuel Boom Indication 1: Pulmonary embolus (ICD-415.19) Lab Used: [facility] [place] Site: [address] INR POC 2.8 INR RANGE 2 - 3  Dietary changes: no    Health status changes: no    Bleeding/hemorrhagic complications: no    Recent/future hospitalizations: no    Any changes in medication regimen? no    Recent/future dental: no  Any missed doses?: no       Is patient compliant with meds? yes       Allergies: 1)  ! Valium 2)  ! Cardizem 3)  ! * Deltasone 4)  ! * Tequin 5)  ! * Lyrica 6)  ! * Amiodarone 7)  ! * Duragesic 8)  ! * Spironolactone 9)  Levaquin  Anticoagulation Management History:      The patient is taking warfarin and comes in today for a routine follow up visit.  Positive risk factors for bleeding include an age of 75 years or older and presence of serious comorbidities.  The bleeding index is 'intermediate risk'.  Positive CHADS2 values include History of CHF, History of HTN, Age > 49 years old, and History of Diabetes.  The start date was 03/26/2005.  Her last INR was 5.8 RATIO.  Anticoagulation responsible provider: Leonard Hendler MD, Reuel Boom.  INR POC: 2.8.  Cuvette Lot#: [id number].  Exp: 04/2011.    Anticoagulation Management Assessment/Plan:      The patient's current anticoagulation dose is Warfarin sodium 5 mg tabs: Take as directed by coumadin clinic..  The target INR is 2 - 3.  The next INR is due 02/14/2010.  Anticoagulation instructions were given to daughter/patient.  Results were reviewed/authorized by Eda Keys, PharmD.  She was notified by Eda Keys.         Prior Anticoagulation Instructions: INR 2.6  Continue same dose of 1 tablet every day except 1/2 tablet on Monday and Friday   Current Anticoagulation Instructions: INR 2.8  Continue taking 1/2 tablet on Monday and Friday and 1 tablet all other days.  Return  to clinci in 4 weeks.

## 2010-10-03 NOTE — Assessment & Plan Note (Signed)
Summary: eph./ gd  Medications Added PRAVACHOL 20 MG TABS (PRAVASTATIN SODIUM) 1/2 tablet once daily      Allergies Added:   CC:  eph/  pt in hospital in March for CHF.  Pt had a mild heart attack in Feb.  Pt feeling pretty good.  History of Present Illness: Lindsay Sheppard is seen following a recent hospitalization for non-STEMI in February and acute diastolic congestive heart failure in March. She is feeling quite well at this point. There's been no significant shortness of breath. They're managing her Lasix on a day by day adjustment dosing. She is also on aspirin since her non-STEMI.  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 She has a history now of atrial fibrillation with thromboembolic risk factors noted for diabetes hypertension gender and age. She is on Coumadin. She also has a history of pulmonary embolism.        Current Medications (verified): 1)  Accu-Chek Compact Test Drum  Strp (Glucose Blood) .Marland Kitchen.. 1 Two Times A Day Use Asd - Dx Code 250.02 2)  Labetalol Hcl 200 Mg  Tabs (Labetalol Hcl) .... 2 By Mouth Two Times A Day 3)  Catapres-Tts-3 0.3 Mg/24hr Ptwk (Clonidine Hcl) .... Apply Once Weekly 4)  Ferrous Sulfate 325 (65 Fe) Mg Tabs (Ferrous Sulfate) .... Take 1 Tablet By Mouth Twice A  Day 5)  Fosamax 70 Mg Tabs (Alendronate Sodium) .Marland Kitchen.. 1 Tablet By Mouth Once A Week 6)  Glimepiride 4 Mg Tabs (Glimepiride) .... Take 1 By Mouth Once Daily Only 7)  Lantus 100 Unit/ml Soln (Insulin Glargine) .... Inject 8 Unit Subcutaneously Once A Day 8)  Metformin Hcl 500 Mg Tabs (Metformin Hcl) .Marland Kitchen.. 1 By Mouth Bid 9)  Pantoprazole Sodium 40 Mg  Tbec (Pantoprazole Sodium) .Marland Kitchen.. 1po Qd 10)  Warfarin Sodium 5 Mg Tabs (Warfarin Sodium) .... Take As Directed By Coumadin Clinic. 11)  Bd Insulin Syringe Ultrafine 31g X 5/16" 0.5 Ml  Misc (Insulin Syringe-Needle U-100) .... Use 1 Qd 12)  Sertraline Hcl 50 Mg  Tabs (Sertraline Hcl) .Marland Kitchen.. 1po Once Daily 13)  Felodipine 10 Mg  Xr24h-Tab (Felodipine) .Marland Kitchen.. 1 By Mouth Daily 14)  Tramadol Hcl 50 Mg Tabs (Tramadol Hcl) .Marland Kitchen.. 1 - 2 By Mouth Two Times A Day As Needed 15)  Stool Softner .... As Needed 16)  Pravachol 20 Mg Tabs (Pravastatin Sodium) .... 1/2 Tablet Once Daily 17)  Bactrim Ds 800-160 Mg Tabs (Sulfamethoxazole-Trimethoprim) .Marland Kitchen.. 1po Once Daily 18)  Aspir-Low 81 Mg Tbec (Aspirin) .Marland Kitchen.. 1po Once Daily 19)  Fexofenadine Hcl 180 Mg Tabs (Fexofenadine Hcl) .Marland Kitchen.. 1po Once Daily As Needed Allergies 20)  Gabapentin 300 Mg Caps (Gabapentin) .Marland Kitchen.. 1 By Mouth Three Times A Day 21)  Nasonex 50 Mcg/act Susp (Mometasone Furoate) .... 2 Spray/side Per Day 22)  Furosemide 20 Mg Tabs (Furosemide) .Marland Kitchen.. 1 By Mouth Daily  Allergies (verified): 1)  ! Valium 2)  ! Cardizem 3)  ! * Deltasone 4)  ! * Tequin 5)  ! * Lyrica 6)  ! * Amiodarone 7)  ! * Duragesic 8)  ! * Spironolactone 9)  Levaquin  Past History:  Past Medical History: Last updated: 11/02/2009 Anemia-iron deficiency GERDw/ stricture Hypertension B12 Deficiency Anticoagulation therapy Atrial fibrillation Hyperlipidemia Osteoporosis Pulmonary embolism, hx of hx of shingles - right facial renal cyst Diabetes mellitus, type II macular degeneration - dr Luciana Axe - s/p laser oct 2010 diabetic retinopathy -  dr Luciana Axe  -  s/p laser 2007 Renal  insufficiency Myocardial infarction, hx of Coronary artery disease Allergic rhinitis  Past Surgical History: Last updated: 09/06/2007 Appendectomy Hysterectomy  Family History: Last updated: 08-28-07 brother died with esophageal perforation Family History Diabetes 1st degree relative - 2 sister Family History Hypertension Mother with breast cancer father with colon cancer, died with stroke postop  Social History: Last updated: 06/21/2008 Never Smoked Alcohol use-no Retired with 2 daughters today Drug use-no Regular exercise-yes  Vital Signs:  Patient profile:   77 year old female Height:      66.5  inches Weight:      140 pounds Pulse rate:   72 / minute Pulse rhythm:   regular BP sitting:   118 / 54  (left arm) Cuff size:   regular  Vitals Entered By: Judithe Modest CMA (December 14, 2009 3:07 PM)  Physical Exam  General:  The patient was alert and oriented in no acute distress. HEENT Normal.  Neck veins were flat, carotids were brisk.  Lungs were clear.  Heart sounds were regular without murmurs or gallops.  Abdomen was soft with active bowel sounds. There is no clubbing cyanosis or edema. Skin Warm and dry    EKG  Procedure date:  12/14/2009  Findings:      sinus rhtyhm heart rate 72 Intervals 0.15/0.11/0.40 Left axis deviation RSR prime  Impression & Recommendations:  Problem # 1:  DIASTOLIC HEART FAILURE, CHRONIC (ICD-428.32) tt is doing quite well with her congestive heart failure. The patient's family is modulating the dose of diuretics as needed. She is feeling quite well. We'll check a metabolic profile today. Her updated medication list for this problem includes:    Labetalol Hcl 200 Mg Tabs (Labetalol hcl) .Marland Kitchen... 2 by mouth two times a day    Warfarin Sodium 5 Mg Tabs (Warfarin sodium) .Marland Kitchen... Take as directed by coumadin clinic.    Felodipine 10 Mg Xr24h-tab (Felodipine) .Marland Kitchen... 1 by mouth daily    Aspir-low 81 Mg Tbec (Aspirin) .Marland Kitchen... 1po once daily    Furosemide 20 Mg Tabs (Furosemide) .Marland Kitchen... 1 by mouth daily  Problem # 2:  RECENT NON-STEMI (ICD-412) She is currently taking aspirin with Coumadin. At about 3 months posterior MI likely his white reasonable to stop her aspirin as she is on Coumadin and it would augment bleeding risk. Her updated medication list for this problem includes:    Labetalol Hcl 200 Mg Tabs (Labetalol hcl) .Marland Kitchen... 2 by mouth two times a day    Warfarin Sodium 5 Mg Tabs (Warfarin sodium) .Marland Kitchen... Take as directed by coumadin clinic.    Felodipine 10 Mg Xr24h-tab (Felodipine) .Marland Kitchen... 1 by mouth daily    Aspir-low 81 Mg Tbec (Aspirin) .Marland Kitchen... 1po  once daily  Problem # 3:  HYPERTENSION (ICD-401.9) hypertension is clearly issue. It is reasonably controlled today on a very complex regime. Her updated medication list for this problem includes:    Labetalol Hcl 200 Mg Tabs (Labetalol hcl) .Marland Kitchen... 2 by mouth two times a day    Catapres-tts-3 0.3 Mg/24hr Ptwk (Clonidine hcl) .Marland Kitchen... Apply once weekly    Felodipine 10 Mg Xr24h-tab (Felodipine) .Marland Kitchen... 1 by mouth daily    Aspir-low 81 Mg Tbec (Aspirin) .Marland Kitchen... 1po once daily    Furosemide 20 Mg Tabs (Furosemide) .Marland Kitchen... 1 by mouth daily  Problem # 4:  ATRIAL FIBRILLATION (ICD-427.31) no recent symptomatic atrial fibrillation Her updated medication list for this problem includes:    Labetalol Hcl 200 Mg Tabs (Labetalol hcl) .Marland Kitchen... 2 by mouth two times a day  Warfarin Sodium 5 Mg Tabs (Warfarin sodium) .Marland Kitchen... Take as directed by coumadin clinic.    Aspir-low 81 Mg Tbec (Aspirin) .Marland Kitchen... 1po once daily  Other Orders: EKG w/ Interpretation (93000) T-Basic Metabolic Panel ([id number])  Patient Instructions: 1)  Your physician recommends that you continue on your current medications as directed. Please refer to the Current Medication list given to you today. 2)  Your physician wants you to follow-up in: 6 months with Dr Graciela Husbands.  You will receive a reminder letter in the mail two months in advance. If you don't receive a letter, please call our office to schedule the follow-up appointment.

## 2010-10-03 NOTE — Medication Information (Signed)
Summary: rov/ewj  Anticoagulant Therapy  Managed by: Cloyde Reams, RN, BSN Supervising MD: Gala Romney MD, Reuel Boom Indication 1: Pulmonary embolus (ICD-415.19) Lab Used: [facility] [place] Site: [address] INR POC 1.6 INR RANGE 2 - 3  Dietary changes: no    Health status changes: no    Bleeding/hemorrhagic complications: no    Recent/future hospitalizations: yes       Details: Pt in hospital for MI, discharged on Friday 10/26/09.   Any changes in medication regimen? no    Recent/future dental: no  Any missed doses?: no       Is patient compliant with meds? yes      Comments: INR on 2/25 at discharge was 1.47  Allergies: 1)  ! Valium 2)  ! Cardizem 3)  ! * Deltasone 4)  ! * Tequin 5)  ! * Lyrica 6)  ! * Amiodarone 7)  ! * Duragesic 8)  ! * Spironolactone 9)  Levaquin  Anticoagulation Management History:      The patient is taking warfarin and comes in today for a routine follow up visit.  Positive risk factors for bleeding include an age of 29 years or older and presence of serious comorbidities.  The bleeding index is 'intermediate risk'.  Positive CHADS2 values include History of HTN, Age > 33 years old, and History of Diabetes.  The start date was 03/26/2005.  Her last INR was 5.8 RATIO.  Anticoagulation responsible provider: Bensimhon MD, Reuel Boom.  INR POC: 1.6.  Cuvette Lot#: [id number].  Exp: 12/2010.    Anticoagulation Management Assessment/Plan:      The patient's current anticoagulation dose is Warfarin sodium 5 mg tabs: Take as directed by coumadin clinic..  The target INR is 2 - 3.  The next INR is due 11/13/2009.  Anticoagulation instructions were given to daughter/patient.  Results were reviewed/authorized by Cloyde Reams, RN, BSN.  She was notified by Cloyde Reams RN.         Prior Anticoagulation Instructions: INR 1.9  Take 1.5 tablets today then resume same dosage 1 tablet daily except 1/2 tablet on Mondays, Wednesdays, and Fridays.   Recheck in 2 weeks.    Current Anticoagulation Instructions: INR 1.6  Take 1.5 tablets today, then resume same dosage 1 tablet daily except 1/2 tablet on Mondays, Wednesdays, and Fridays.  Recheck in 2 weeks.

## 2010-10-03 NOTE — Assessment & Plan Note (Signed)
Summary: B-12 /JWJ /NWS--prolia inj--collect 0$ out of pocket/cd   Nurse Visit   Allergies: 1)  ! Valium 2)  ! Cardizem 3)  ! * Deltasone 4)  ! * Tequin 5)  ! * Lyrica 6)  ! * Amiodarone 7)  ! * Duragesic 8)  ! * Spironolactone 9)  Levaquin  Medication Administration  Injection # 1:    Medication: Prolia 60mg     Diagnosis: OSTEOPOROSIS (ICD-733.00)    Route: SQ    Site: Left arm    Exp Date: 06/2012    Lot #: [id number]    Mfr: AMGEN    Patient tolerated injection without complications    Given by: Lamar Sprinkles, CMA (September 27, 2010 5:51 PM)  Injection # 2:    Medication: Vit B12 1000 mcg    Diagnosis: B12 DEFICIENCY (ICD-266.2)    Route: IM    Site: R deltoid    Exp Date: 12/01/2010    Lot #: [id number]    Mfr: American Regent  Orders Added: 1)  Admin of Therapeutic Inj  intramuscular or subcutaneous [96372] 2)  Prolia 60mg  [J3590] 3)  Vit B12 1000 mcg [J3420] 4)  Admin of Therapeutic Inj  intramuscular or subcutaneous [96372]   Medication Administration  Injection # 1:    Medication: Prolia 60mg     Diagnosis: OSTEOPOROSIS (ICD-733.00)    Route: SQ    Site: Left arm    Exp Date: 06/2012    Lot #: [id number]    Mfr: AMGEN    Patient tolerated injection without complications    Given by: Lamar Sprinkles, CMA (September 27, 2010 5:51 PM)  Injection # 2:    Medication: Vit B12 1000 mcg    Diagnosis: B12 DEFICIENCY (ICD-266.2)    Route: IM    Site: R deltoid    Exp Date: 12/01/2010    Lot #: [id number]    Mfr: American Regent  Orders Added: 1)  Admin of Therapeutic Inj  intramuscular or subcutaneous [96372] 2)  Prolia 60mg  [J3590] 3)  Vit B12 1000 mcg [J3420] 4)  Admin of Therapeutic Inj  intramuscular or subcutaneous [[id number]]

## 2010-10-03 NOTE — Medication Information (Signed)
Summary: rov/cs   Anticoagulant Therapy  Managed by: Weston Brass, PharmD Referring MD: Graciela Husbands PCP: Corwin Levins MD Supervising MD: Graciela Husbands MD, Viviann Spare Indication 1: Pulmonary embolus (ICD-415.19) Lab Used: [facility] [place] Site: [address] INR POC 2.4 INR RANGE 2 - 3  Dietary changes: no    Health status changes: no    Bleeding/hemorrhagic complications: no    Recent/future hospitalizations: no    Any changes in medication regimen? yes       Details: Pt was on a medication for a UTI x 3 days, 10/19-10/21 (pt does not recall what antibiotic she was on).  Started back on bactrim on 06/23/10.   Recent/future dental: no  Any missed doses?: no       Is patient compliant with meds? yes       Allergies: 1)  ! Valium 2)  ! Cardizem 3)  ! * Deltasone 4)  ! * Tequin 5)  ! * Lyrica 6)  ! * Amiodarone 7)  ! * Duragesic 8)  ! * Spironolactone 9)  Levaquin  Anticoagulation Management History:      The patient is taking warfarin and comes in today for a routine follow up visit.  Positive risk factors for bleeding include an age of 75 years or older and presence of serious comorbidities.  The bleeding index is 'intermediate risk'.  Positive CHADS2 values include History of CHF, History of HTN, Age > 87 years old, and History of Diabetes.  The start date was 03/26/2005.  Her last INR was 5.8 RATIO.  Anticoagulation responsible provider: Graciela Husbands MD, Viviann Spare.  INR POC: 2.4.  Cuvette Lot#: [id number].  Exp: 07/2011.    Anticoagulation Management Assessment/Plan:      The patient's current anticoagulation dose is Warfarin sodium 5 mg tabs: Take as directed by coumadin clinic..  The target INR is 2 - 3.  The next INR is due 07/22/2010.  Anticoagulation instructions were given to patient/daughters.  Results were reviewed/authorized by Weston Brass, PharmD.  She was notified by Haynes Hoehn, PharmD Candidate.         Prior Anticoagulation Instructions: INR 2.7  Continue same dose  of 1 tablet every day except 1/2 tablet on Sunday, Tuesday and Thursday.  Recheck INR in 2 weeks.   Current Anticoagulation Instructions: INR 2.4  Continue Coumadin as scheduled:  1 tablet on Monday, Wednesday, Friday, and Saturday, and 1/2 tablet on Sunday, Tuesday, and Thurdsay.  Return to clinic in 3 weeks.

## 2010-10-03 NOTE — Letter (Signed)
Summary: [facility]   Imported By: Sherian Rein 03/28/2010 09:01:49  _____________________________________________________________________  External Attachment:    Type:   Image     Comment:   External Document

## 2010-10-03 NOTE — Assessment & Plan Note (Signed)
Summary: per check out/sf  Medications Added * CIPRO  AS DIRECTED UTI      Allergies Added:   Visit Type:  Follow-up Primary Croy Drumwright:  Corwin Levins MD  CC:  pt has UTI and is on antibotic pt would like to ger coumandin check today.  History of Present Illness: Mrs. Shutter is seen in followup for atrial fibrillation for which he takes Coumadin, coronary artery disease with a non-STEMI in February and recurrent congestive heart failure in the setting of normal systolic function and abnormal diastolic function  She is doing quite well. She denies changes in her overall status. Specifically there is no significant shortness of breath chest discomfort or peripheral edema.  Her INR last week was a little high side. She was started on Cipro yesterday for UTI.   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 thromboembolic risk factors noted for diabetes hypertension gender and age. She also has a history of pulmonary embolism.        Current Medications (verified): 1)  Accu-Chek Compact Test Drum  Strp (Glucose Blood) .Marland Kitchen.. 1 Two Times A Day Use Asd - Dx Code 250.02 2)  Labetalol Hcl 200 Mg  Tabs (Labetalol Hcl) .... 2 By Mouth Two Times A Day 3)  Catapres-Tts-2 0.2 Mg/24hr Ptwk (Clonidine Hcl) .Marland Kitchen.. 1 Patch Q Wk 4)  Ferrous Sulfate 325 (65 Fe) Mg Tabs (Ferrous Sulfate) .... Take 1 Tablet By Mouth Twice A  Day 5)  Fosamax 70 Mg Tabs (Alendronate Sodium) .Marland Kitchen.. 1 Tablet By Mouth Once A Week 6)  Glimepiride 4 Mg Tabs (Glimepiride) .... Take 1 By Mouth Once Daily Only 7)  Lantus 100 Unit/ml Soln (Insulin Glargine) .... Inject 8 Unit Subcutaneously Once A Day 8)  Metformin Hcl 500 Mg Tabs (Metformin Hcl) .Marland Kitchen.. 1 By Mouth Bid 9)  Pantoprazole Sodium 40 Mg  Tbec (Pantoprazole Sodium) .Marland Kitchen.. 1po Qd 10)  Warfarin Sodium 5 Mg Tabs (Warfarin Sodium) .... Take As Directed By Coumadin Clinic. 11)  Bd Insulin Syringe Ultrafine 31g X 5/16" 0.5 Ml  Misc (Insulin Syringe-Needle U-100)  .... Use 1 Qd 12)  Sertraline Hcl 50 Mg  Tabs (Sertraline Hcl) .Marland Kitchen.. 1po Once Daily 13)  Hydrocodone-Acetaminophen 5-325 Mg Tabs (Hydrocodone-Acetaminophen) .... Taking 1/2-1 Tablet Daily 14)  Stool Softner .... As Needed 15)  Pravachol 20 Mg Tabs (Pravastatin Sodium) .... 1/2 Tablet Once Daily 16)  Bactrim Ds 800-160 Mg Tabs (Sulfamethoxazole-Trimethoprim) .Marland Kitchen.. 1po Once Daily 17)  Fexofenadine Hcl 180 Mg Tabs (Fexofenadine Hcl) .Marland Kitchen.. 1po Once Daily As Needed Allergies 18)  Gabapentin 300 Mg Caps (Gabapentin) .Marland Kitchen.. 1 By Mouth Three Times A Day 19)  Nasonex 50 Mcg/act Susp (Mometasone Furoate) .... 2 Spray/side Per Day 20)  Furosemide 20 Mg Tabs (Furosemide) .Marland Kitchen.. 1 By Mouth Every Other Day 21)  Cyanocobalamin 1000 Mcg/ml Soln (Cyanocobalamin) .... Inject 1016mcg/ml Once A Month 22)  3ml 25g 5/8 (0.51mm X 16mm) .... Use As Directed 23)  Prolia 60 Mg/ml Soln (Denosumab) .... Q 52mo 24)  Tramadol Hcl 50 Mg Tabs (Tramadol Hcl) .... As Needed 25)  Cipro  As Directed .... Uti  Allergies (verified): 1)  ! Valium 2)  ! Cardizem 3)  ! * Deltasone 4)  ! * Tequin 5)  ! * Lyrica 6)  ! * Amiodarone 7)  ! * Duragesic 8)  ! * Spironolactone 9)  Levaquin  Past History:  Past Medical History: Last updated: 11/02/2009 Anemia-iron deficiency GERDw/ stricture Hypertension B12 Deficiency Anticoagulation therapy  Atrial fibrillation Hyperlipidemia Osteoporosis Pulmonary embolism, hx of hx of shingles - right facial renal cyst Diabetes mellitus, type II macular degeneration - dr Luciana Axe - s/p laser oct 2010 diabetic retinopathy -  dr Luciana Axe  -  s/p laser 2007 Renal insufficiency Myocardial infarction, hx of Coronary artery disease Allergic rhinitis  Vital Signs:  Patient profile:   77 year old female Height:      65 inches Weight:      139 pounds BMI:     23.21 Pulse rate:   76 / minute BP sitting:   150 / 75  (left arm) Cuff size:   regular  Vitals Entered By: Burnett Kanaris, CNA  (June 19, 2010 9:54 AM)  Physical Exam  General:  The patient was alert and oriented in no acute distress. HEENT Normal.  Neck veins were flat, carotids were brisk.  Lungs were clear.  Heart sounds were regular without murmurs or gallops.  Abdomen was soft with active bowel sounds. There is no clubbing cyanosis or edema. Skin Warm and dry    Impression & Recommendations:  Problem # 1:  ATRIAL FIBRILLATION (ICD-427.31) she is holding sinus rhythm. Given her exposure to Cipro, we'll check her INR today. Her updated medication list for this problem includes:    Labetalol Hcl 200 Mg Tabs (Labetalol hcl) .Marland Kitchen... 2 by mouth two times a day    Warfarin Sodium 5 Mg Tabs (Warfarin sodium) .Marland Kitchen... Take as directed by coumadin clinic.  Problem # 2:  DIASTOLIC HEART FAILURE, CHRONIC (ICD-428.32) she is euvolemic. Will continue on her current medications Her updated medication list for this problem includes:    Labetalol Hcl 200 Mg Tabs (Labetalol hcl) .Marland Kitchen... 2 by mouth two times a day    Warfarin Sodium 5 Mg Tabs (Warfarin sodium) .Marland Kitchen... Take as directed by coumadin clinic.    Furosemide 20 Mg Tabs (Furosemide) .Marland Kitchen... 1 by mouth every other day  Problem # 3:  POSTURAL HYPOTENSION (ICD-458.0) stable  Problem # 4:  CORONARY ARTERY DISEASE S/P NSTEMI 3.11 (ICD-414.00) stable her current medications; no aspirin secondary to Coumadin Her updated medication list for this problem includes:    Labetalol Hcl 200 Mg Tabs (Labetalol hcl) .Marland Kitchen... 2 by mouth two times a day    Warfarin Sodium 5 Mg Tabs (Warfarin sodium) .Marland Kitchen... Take as directed by coumadin clinic.  Patient Instructions: 1)  Your physician recommends that you schedule a follow-up appointment in: 12 months  Appended Document: patient instructions   Patient Instructions: 1)  Your physician recommends that you continue on your current medications as directed. Please refer to the Current Medication list given to you today. 2)  Your physician  wants you to follow-up in: 1 year  You will receive a reminder letter in the mail two months in advance. If you don't receive a letter, please call our office to schedule the follow-up appointment.

## 2010-10-03 NOTE — Medication Information (Signed)
Summary: Ins verification for Prolia/ProliaPlus  Ins verification for Prolia/ProliaPlus   Imported By: Sherian Rein 03/28/2010 12:26:40  _____________________________________________________________________  External Attachment:    Type:   Image     Comment:   External Document

## 2010-10-03 NOTE — Medication Information (Signed)
Summary: Coumadin Clinic  Anticoagulant Therapy  Managed by: Weston Brass, PharmD Referring MD: Linus Orn MD: Gala Romney MD, Reuel Boom Indication 1: Pulmonary embolus (ICD-415.19) Lab Used: [facility] [place] Site: [address] PT 54.0 INR POC 4.5 INR RANGE 2 - 3  Dietary changes: no    Health status changes: no    Bleeding/hemorrhagic complications: no    Recent/future hospitalizations: no    Any changes in medication regimen? yes       Details: changed from ultram to vicodin  Recent/future dental: no  Any missed doses?: no       Is patient compliant with meds? yes       Allergies: 1)  ! Valium 2)  ! Cardizem 3)  ! * Deltasone 4)  ! * Tequin 5)  ! * Lyrica 6)  ! * Amiodarone 7)  ! * Duragesic 8)  ! * Spironolactone 9)  Levaquin  Anticoagulation Management History:      Her anticoagulation is being managed by telephone today.  Positive risk factors for bleeding include an age of 75 years or older and presence of serious comorbidities.  The bleeding index is 'intermediate risk'.  Positive CHADS2 values include History of CHF, History of HTN, Age > 75 years old, and History of Diabetes.  The start date was 03/26/2005.  Her last INR was 5.8 RATIO.  Prothrombin time is 54.0.  Anticoagulation responsible provider: Bensimhon MD, Reuel Boom.  INR POC: 4.5.  Exp: 04/2011.    Anticoagulation Management Assessment/Plan:      The patient's current anticoagulation dose is Warfarin sodium 5 mg tabs: Take as directed by coumadin clinic..  The target INR is 2 - 3.  The next INR is due 02/18/2010.  Anticoagulation instructions were given to daughter/patient.  Results were reviewed/authorized by Weston Brass, PharmD.  She was notified by Weston Brass PharmD.         Prior Anticoagulation Instructions: INR 1.7  Spoke with pt's daughter.  Take 1 tablet today then resume same dose of 1 tablet every day except 1/2 tablet on Monday and Friday.  Gave orders to [facility] with [facility].     Current Anticoagulation Instructions: INR 4.5  Spoke with Pat with [facility] while in pt's home.  Skip today's dose of Coumadin, take 1/2 tablet tomorrow then reduce dose to 1 tablet every day except 1/2 tablet on Monday, Wednesday and Friday.  Recheck in 1 week.

## 2010-10-03 NOTE — Miscellaneous (Signed)
Summary: [facility] Orders   [facility] Orders   Imported By: Roderic Ovens 03/12/2010 14:18:15  _____________________________________________________________________  External Attachment:    Type:   Image     Comment:   External Document

## 2010-10-03 NOTE — Assessment & Plan Note (Signed)
Summary: B12/Corgan Mormile/CD   Nurse Visit   Allergies: 1)  ! Valium 2)  ! Cardizem 3)  ! * Deltasone 4)  ! * Tequin 5)  ! * Lyrica 6)  ! * Amiodarone 7)  ! * Duragesic 8)  ! * Spironolactone 9)  Levaquin  Medication Administration  Injection # 1:    Medication: Vit B12 1000 mcg    Diagnosis: B12 DEFICIENCY (ICD-266.2)    Route: IM    Site: L deltoid    Exp Date: 08/02/2011    Lot #: [id number]    Mfr: [employer]    Patient tolerated injection without complications    Given by: Margaret Pyle, CMA (December 14, 2009 2:14 PM)  Orders Added: 1)  Vit B12 1000 mcg [J3420] 2)  Admin of Therapeutic Inj  intramuscular or subcutaneous [96372] 3)  Est. Patient Level I [09811]

## 2010-10-03 NOTE — Miscellaneous (Signed)
Summary: [facility] Inpatient Case Management Information Form   [facility] Inpatient Case Management Information Form   Imported By: Roderic Ovens 02/23/2010 09:57:06  _____________________________________________________________________  External Attachment:    Type:   Image     Comment:   External Document

## 2010-10-03 NOTE — Progress Notes (Signed)
Summary: rx clarification  Phone Note Call from Patient   Caller: Daughter--Bonnie----[phone] Summary of Call: Patient daughter called stating that the patient has been taking 1/2 tab of 40mg  Pravastatin. The office gave patient prescription stating to take 1/2 of a 20mg  pill. Daughter would like to know if the patient should take half of a 20 or half of a 40. Please advise and send new prescription to [facility].  Initial call taken by: Lucious Groves CMA,  March 28, 2010 8:29 AM  Follow-up for Phone Call        half of 20 mg is ok Follow-up by: Corwin Levins MD,  March 28, 2010 9:39 AM  Additional Follow-up for Phone Call Additional follow up Details #1::        Patient daughter notified. Additional Follow-up by: Lucious Groves CMA,  March 28, 2010 9:46 AM

## 2010-10-03 NOTE — Progress Notes (Signed)
Summary: Prolia due 09-25-10  Phone Note Outgoing Call   Call placed by: Lanier Prude, [facility]),  September 19, 2010 4:31 PM Summary of Call: rec benefit summary for Prolia coverage. pt will owe $0 out of pocket.  I informed pt's daughter Kendal Hymen. Pt will be coming 09-27-10 to have Prolia injection the same day she her B12 inj.  I will order Prolia and have it ready for her.  Follow-up for Phone Call        noted Follow-up by: Corwin Levins MD,  September 22, 2010 1:21 PM

## 2010-10-03 NOTE — Progress Notes (Signed)
  Phone Note Call from Patient   Caller: Patient Summary of Call: Patient is requesting a prescription for B-12 to be sent to pharmacy at [facility]. [place]. Pt. will still continue coming here to get her shots, but will bring her own B-12. Initial call taken by: Robin Ewing CMA Duncan Dull),  February 22, 2010 9:50 AM  Follow-up for Phone Call        ok  - to robin Follow-up by: Corwin Levins MD,  February 22, 2010 10:54 AM    New/Updated Medications: CYANOCOBALAMIN 1000 MCG/ML SOLN (CYANOCOBALAMIN) Inject 1076mcg/ml once a month Prescriptions: CYANOCOBALAMIN 1000 MCG/ML SOLN (CYANOCOBALAMIN) Inject 1063mcg/ml once a month  #1 x 11   Entered by:   Zella Ball Ewing CMA (AAMA)   Authorized by:   Corwin Levins MD   Signed by:   Scharlene Gloss CMA (AAMA) on 02/22/2010   Method used:   Faxed to ...       [facility] [address] [phone]* (retail)       [address] [place], [place]  [id number]       Ph: [phone]       Fax: [phone]   RxID:   [id number]   Appended Document:     Phone Note From Pharmacy   Summary of Call: Pharmacy called regarding script for 1ml b12 vials, stating unavailible and request to know if ok to switch to a 10ml multi dose vial due to [employer]. Advised ok to switch also gave verbal for 3ml 25G 5/8 syringe w/ needle. MD agreed to advisement    New/Updated Medications: * 25G 5/8 (0.5MM X ) use as directed Prescriptions: 25G 5/8 (0.5MM X ) use as directed  #80month x 11   Entered by:   Rock Nephew CMA   Authorized by:   Corwin Levins MD   Signed by:   Rock Nephew CMA on 02/22/2010   Method used:   Telephoned to ...       [facility] [address] [phone]* (retail)       [address] [place], [place]  [id number]       Ph: [phone]       Fax: [phone]   RxID:   [id number]

## 2010-10-03 NOTE — Medication Information (Signed)
Summary: rov/eac  Anticoagulant Therapy  Managed by: Bethena Midget, RN, BSN Referring MD: Graciela Husbands PCP: Corwin Levins MD Supervising MD: Tenny Craw MD, Gunnar Fusi Indication 1: Pulmonary embolus (ICD-415.19) Lab Used: [facility] [place] Site: [address] INR POC 2.6 INR RANGE 2 - 3  Dietary changes: no    Health status changes: no    Bleeding/hemorrhagic complications: no    Recent/future hospitalizations: no    Any changes in medication regimen? no    Recent/future dental: no  Any missed doses?: no       Is patient compliant with meds? yes       Allergies: 1)  ! Valium 2)  ! Cardizem 3)  ! * Deltasone 4)  ! * Tequin 5)  ! * Lyrica 6)  ! * Amiodarone 7)  ! * Duragesic 8)  ! * Spironolactone 9)  Levaquin  Anticoagulation Management History:      The patient is taking warfarin and comes in today for a routine follow up visit.  Positive risk factors for bleeding include an age of 75 years or older and presence of serious comorbidities.  The bleeding index is 'intermediate risk'.  Positive CHADS2 values include History of CHF, History of HTN, Age > 39 years old, and History of Diabetes.  The start date was 03/26/2005.  Her last INR was 5.8 RATIO.  Anticoagulation responsible provider: Tenny Craw MD, Gunnar Fusi.  INR POC: 2.6.  Cuvette Lot#: [id number].  Exp: 09/2011.    Anticoagulation Management Assessment/Plan:      The patient's current anticoagulation dose is Warfarin sodium 5 mg tabs: Take as directed by coumadin clinic..  The target INR is 2 - 3.  The next INR is due 10/25/2010.  Anticoagulation instructions were given to patient/daughters.  Results were reviewed/authorized by Bethena Midget, RN, BSN.  She was notified by Bethena Midget, RN, BSN.         Prior Anticoagulation Instructions: INR 2.4  Continue taking 1/2 tablet on Tuesday and Thursday and 1 tablet all other days.  Return to clinic in 4 weeks.    Current Anticoagulation Instructions: INR 2.6 Continue 1 pill  everyday except 1/2 pill on Tuesdays and Thursdays. REcheck in 4 weeks.

## 2010-10-03 NOTE — Medication Information (Signed)
Summary: rov/ewj  Anticoagulant Therapy  Managed by: Cloyde Reams, RN, BSN Supervising MD: Myrtis Ser MD, Tinnie Gens Indication 1: Pulmonary embolus (ICD-415.19) Lab Used: [facility] [place] Site: [address] INR POC 1.9 INR RANGE 2 - 3  Dietary changes: no    Health status changes: no    Bleeding/hemorrhagic complications: no    Recent/future hospitalizations: no    Any changes in medication regimen? no    Recent/future dental: no  Any missed doses?: no       Is patient compliant with meds? yes       Current Medications (verified): 1)  Accu-Chek Compact Test Drum  Strp (Glucose Blood) .Marland Kitchen.. 1 Two Times A Day Use Asd - Dx Code 250.02 2)  Labetalol Hcl 200 Mg  Tabs (Labetalol Hcl) .Marland Kitchen.. 1 By Mouth Two Times A Day 3)  Catapres-Tts-3 0.3 Mg/24hr Ptwk (Clonidine Hcl) .... Apply Once Weekly 4)  Ferrous Sulfate 325 (65 Fe) Mg Tabs (Ferrous Sulfate) .... Take 1 Tablet By Mouth Twice A  Day 5)  Fosamax 70 Mg Tabs (Alendronate Sodium) .Marland Kitchen.. 1 Tablet By Mouth Once A Week 6)  Glimepiride 4 Mg Tabs (Glimepiride) .... Take 1 By Mouth Once Daily Only 7)  Lantus 100 Unit/ml Soln (Insulin Glargine) .... Inject 8 Unit Subcutaneously Once A Day 8)  Metformin Hcl 500 Mg Tabs (Metformin Hcl) .Marland Kitchen.. 1 By Mouth Bid 9)  Pantoprazole Sodium 40 Mg  Tbec (Pantoprazole Sodium) .Marland Kitchen.. 1po Qd 10)  Warfarin Sodium 5 Mg Tabs (Warfarin Sodium) .... Take As Directed By Coumadin Clinic. 11)  Bd Insulin Syringe Ultrafine 31g X 5/16" 0.5 Ml  Misc (Insulin Syringe-Needle U-100) .... Use 1 Qd 12)  Sertraline Hcl 50 Mg  Tabs (Sertraline Hcl) .Marland Kitchen.. 1po Once Daily 13)  Kionex 15 Gm/33ml Susp (Sodium Polystyrene Sulfonate) .Marland Kitchen.. 1 At Bedtime 14)  Felodipine 10 Mg Xr24h-Tab (Felodipine) .Marland Kitchen.. 1 By Mouth Daily 15)  Tramadol Hcl 50 Mg Tabs (Tramadol Hcl) .Marland Kitchen.. 1 - 2 By Mouth Two Times A Day As Needed 16)  Stool Softner .... As Needed 17)  Pravastatin Sodium 40 Mg Tabs (Pravastatin Sodium) .... Take 1 By Mouth Qd 18)   Bactrim Ds 800-160 Mg Tabs (Sulfamethoxazole-Trimethoprim) .Marland Kitchen.. 1po Once Daily  Allergies: 1)  ! Valium 2)  ! Cardizem 3)  ! * Deltasone 4)  ! * Tequin 5)  ! * Lyrica 6)  ! * Amiodarone 7)  ! * Duragesic 8)  ! * Spironolactone 9)  Levaquin  Anticoagulation Management History:      The patient is taking warfarin and comes in today for a routine follow up visit.  Positive risk factors for bleeding include an age of 32 years or older and presence of serious comorbidities.  The bleeding index is 'intermediate risk'.  Positive CHADS2 values include History of HTN, Age > 58 years old, and History of Diabetes.  The start date was 03/26/2005.  Her last INR was 5.8 RATIO.  Anticoagulation responsible provider: Myrtis Ser MD, Tinnie Gens.  INR POC: 1.9.  Exp: 12/2010.    Anticoagulation Management Assessment/Plan:      The patient's current anticoagulation dose is Warfarin sodium 5 mg tabs: Take as directed by coumadin clinic..  The target INR is 2 - 3.  The next INR is due 10/30/2009.  Anticoagulation instructions were given to daughter/patient.  Results were reviewed/authorized by Cloyde Reams, RN, BSN.  She was notified by Cloyde Reams RN.         Prior Anticoagulation Instructions: INR 1.7  Take 1.5 tablets today then start taking 1 tablet daily except 1/2 tablet on Mondays, Wednesdays, and Fridays.  Recheck in 2 weeks.    Current Anticoagulation Instructions: INR 1.9  Take 1.5 tablets today then resume same dosage 1 tablet daily except 1/2 tablet on Mondays, Wednesdays, and Fridays.  Recheck in 2 weeks.

## 2010-10-03 NOTE — Medication Information (Signed)
Summary: rov/ewj  Anticoagulant Therapy  Managed by: Cloyde Reams, RN, BSN Referring MD: Linus Orn MD: Shirlee Latch MD, Dalton Indication 1: Pulmonary embolus (ICD-415.19) Lab Used: [facility] [place] Site: [address] INR POC 1.9 INR RANGE 2 - 3  Dietary changes: no    Health status changes: no    Bleeding/hemorrhagic complications: no    Recent/future hospitalizations: no    Any changes in medication regimen? yes       Details: Tamadol discontinued use of and now using Hydrocodone PRN. Lasix now pt is taking QOD.  Recent/future dental: no  Any missed doses?: no       Is patient compliant with meds? yes       Allergies: 1)  ! Valium 2)  ! Cardizem 3)  ! * Deltasone 4)  ! * Tequin 5)  ! * Lyrica 6)  ! * Amiodarone 7)  ! * Duragesic 8)  ! * Spironolactone 9)  Levaquin  Anticoagulation Management History:      The patient is taking warfarin and comes in today for a routine follow up visit.  Positive risk factors for bleeding include an age of 75 years or older and presence of serious comorbidities.  The bleeding index is 'intermediate risk'.  Positive CHADS2 values include History of CHF, History of HTN, Age > 75 years old, and History of Diabetes.  The start date was 03/26/2005.  Her last INR was 5.8 RATIO.  Anticoagulation responsible Thecla Forgione: Shirlee Latch MD, Dalton.  INR POC: 1.9.  Cuvette Lot#: [id number].  Exp: 07/2011.    Anticoagulation Management Assessment/Plan:      The patient's current anticoagulation dose is Warfarin sodium 5 mg tabs: Take as directed by coumadin clinic..  The target INR is 2 - 3.  The next INR is due 06/11/2010.  Anticoagulation instructions were given to patient/daughters.  Results were reviewed/authorized by Cloyde Reams, RN, BSN.  She was notified by Cloyde Reams RN.         Prior Anticoagulation Instructions: INR 1.7  Start taking 1/2 tablet daily except 1 tablet on Mondays, Wednesdays and Fridays.  Recheck in 3 weeks.     Current Anticoagulation Instructions: INR 1.9 Change dose to 1 tablet everyday except 1/2 tablet on Tuesdays, Thursdays and Sundays. Recheck in 3 weeks.

## 2010-10-03 NOTE — Medication Information (Signed)
Summary: Coumadin Clinic  Anticoagulant Therapy  Managed by: Bethena Midget, RN, BSN Referring MD: Linus Orn MD: Shirlee Latch MD, Rinda Rollyson Indication 1: Pulmonary embolus (ICD-415.19) Lab Used: [facility] [place] [place] Site: [address] PT 45.8 INR POC 3.8 INR RANGE 2 - 3  Dietary changes: no    Health status changes: no    Bleeding/hemorrhagic complications: no    Recent/future hospitalizations: no    Any changes in medication regimen? yes       Details: Completed regimen of Keflex. Remains on Bactrim daily which was held when she was on Keflex so she resumed her Bactrim  last week.   Recent/future dental: no  Any missed doses?: no       Is patient compliant with meds? yes       Allergies: 1)  ! Valium 2)  ! Cardizem 3)  ! * Deltasone 4)  ! * Tequin 5)  ! * Lyrica 6)  ! * Amiodarone 7)  ! * Duragesic 8)  ! * Spironolactone 9)  Levaquin  Anticoagulation Management History:      Her anticoagulation is being managed by telephone today.  Positive risk factors for bleeding include an age of 75 years or older and presence of serious comorbidities.  The bleeding index is 'intermediate risk'.  Positive CHADS2 values include History of CHF, History of HTN, Age > 62 years old, and History of Diabetes.  The start date was 03/26/2005.  Her last INR was 5.8 RATIO.  Prothrombin time is 45.8.  Anticoagulation responsible provider: Shirlee Latch MD, Gretta Samons.  INR POC: 3.8.    Anticoagulation Management Assessment/Plan:      The patient's current anticoagulation dose is Warfarin sodium 5 mg tabs: Take as directed by coumadin clinic..  The target INR is 2 - 3.  The next INR is due 02/25/2010.  Anticoagulation instructions were given to daughter/patient.  Results were reviewed/authorized by Bethena Midget, RN, BSN.  She was notified by Bethena Midget, RN, BSN.         Prior Anticoagulation Instructions: INR 4.5  Spoke with Pat with [facility] while in pt's home.  Skip today's dose of Coumadin,  take 1/2 tablet tomorrow then reduce dose to 1 tablet every day except 1/2 tablet on Monday, Wednesday and Friday.  Recheck in 1 week.   Current Anticoagulation Instructions: INR 3.8 Skip today then change dose to 2.5mg s daily except 5mg s on TT&Sun. Orders given to Ardis Hughs with [facility] while at home with pt.

## 2010-10-03 NOTE — Medication Information (Signed)
Summary: rov/ez  Anticoagulant Therapy  Managed by: Cloyde Reams, RN, BSN Supervising MD: Graciela Husbands MD, Viviann Spare Indication 1: Pulmonary embolus (ICD-415.19) Lab Used: [facility] [place] Site: [address] INR POC 1.7 INR RANGE 2 - 3  Dietary changes: no    Health status changes: no    Bleeding/hemorrhagic complications: no    Recent/future hospitalizations: no    Any changes in medication regimen? no    Recent/future dental: no  Any missed doses?: no       Is patient compliant with meds? yes      Comments: Converting pt from clonidine tablet to the patch.    Allergies: 1)  ! Valium 2)  ! Cardizem 3)  ! * Deltasone 4)  ! * Tequin 5)  ! * Lyrica 6)  ! * Amiodarone 7)  ! * Duragesic 8)  ! * Spironolactone 9)  Levaquin  Anticoagulation Management History:      The patient is taking warfarin and comes in today for a routine follow up visit.  Positive risk factors for bleeding include an age of 75 years or older and presence of serious comorbidities.  The bleeding index is 'intermediate risk'.  Positive CHADS2 values include History of HTN, Age > 56 years old, and History of Diabetes.  The start date was 03/26/2005.  Her last INR was 5.8 RATIO.  Anticoagulation responsible provider: Graciela Husbands MD, Viviann Spare.  INR POC: 1.7.  Cuvette Lot#: [id number].  Exp: 12/2010.    Anticoagulation Management Assessment/Plan:      The patient's current anticoagulation dose is Warfarin sodium 5 mg tabs: Take as directed by coumadin clinic..  The target INR is 2 - 3.  The next INR is due 10/16/2009.  Anticoagulation instructions were given to daughter/patient.  Results were reviewed/authorized by Cloyde Reams, RN, BSN.  She was notified by Cloyde Reams RN.         Prior Anticoagulation Instructions: INR: 1.6  Increase to 5 mg = 1 tab daily for 4 days (Wednesday, Thursday, Friday, Saturday) then resume normal dosing of 0.5 tab daily except 1 tab on Tuesday, Thursday, and Saturday.    Recheck in 10 days.    Current Anticoagulation Instructions: INR 1.7  Take 1.5 tablets today then start taking 1 tablet daily except 1/2 tablet on Mondays, Wednesdays, and Fridays.  Recheck in 2 weeks.

## 2010-10-03 NOTE — Medication Information (Signed)
Summary: Coumadin Clinic  Anticoagulant Therapy  Managed by: Bethena Midget, RN, BSN Referring MD: Linus Orn MD: Jens Som MD, Arlys John Indication 1: Pulmonary embolus (ICD-415.19) Lab Used: [facility] [place] Site: [address] PT 44.7 INR POC 3.7 INR RANGE 2 - 3  Dietary changes: no    Health status changes: no    Bleeding/hemorrhagic complications: no    Recent/future hospitalizations: no    Any changes in medication regimen? yes       Details: Doxazosin discontinued.   Recent/future dental: no  Any missed doses?: no       Is patient compliant with meds? yes      Comments: Per Ardis Hughs [facility] nurse with [facility].  D  Allergies: 1)  ! Valium 2)  ! Cardizem 3)  ! * Deltasone 4)  ! * Tequin 5)  ! * Lyrica 6)  ! * Amiodarone 7)  ! * Duragesic 8)  ! * Spironolactone 9)  Levaquin  Anticoagulation Management History:      Her anticoagulation is being managed by telephone today.  Positive risk factors for bleeding include an age of 31 years or older and presence of serious comorbidities.  The bleeding index is 'intermediate risk'.  Positive CHADS2 values include History of CHF, History of HTN, Age > 63 years old, and History of Diabetes.  The start date was 03/26/2005.  Her last INR was 5.8 RATIO.  Prothrombin time is 44.7.  Anticoagulation responsible provider: Jens Som MD, Arlys John.  INR POC: 3.7.    Anticoagulation Management Assessment/Plan:      The patient's current anticoagulation dose is Warfarin sodium 5 mg tabs: Take as directed by coumadin clinic..  The target INR is 2 - 3.  The next INR is due 03/07/2010.  Anticoagulation instructions were given to home health nurse.  Results were reviewed/authorized by Bethena Midget, RN, BSN.  She was notified by Bethena Midget, RN, BSN.         Prior Anticoagulation Instructions: INR 3.8 Skip today then change dose to 2.5mg s daily except 5mg s on TT&Sun. Orders given to Ardis Hughs with [facility] while at home with pt.    Current Anticoagulation Instructions: INR 3.7 Skip today then change dose 2.5mg s daily except 5mg s MWF. Recheck in 10 days. Orders given to [facility] while at home with pt.

## 2010-10-03 NOTE — Progress Notes (Signed)
Summary:  med question   Phone Note Call from Patient Call back at [phone]   Caller: Daughter/Bonnie Reason for Call: Talk to Nurse Summary of Call: at time of DC was given RX for Labetolol 400mg  by mouth two times a day, is this correct? was written by Dr Delsa Grana Initial call taken by: Migdalia Dk,  October 30, 2009 2:03 PM  Follow-up for Phone Call        questions answered Follow-up by: Duncan Dull, RN, BSN,  November 01, 2009 3:32 PM

## 2010-10-03 NOTE — Letter (Signed)
Summary: [facility]  [facility]   Imported By: Sherian Rein 02/27/2010 11:13:24  _____________________________________________________________________  External Attachment:    Type:   Image     Comment:   External Document

## 2010-10-03 NOTE — Assessment & Plan Note (Signed)
Summary: B-12 / Sammuel Cooper  Natale Milch   Nurse Visit   Allergies: 1)  ! Valium 2)  ! Cardizem 3)  ! * Deltasone 4)  ! * Tequin 5)  ! * Lyrica 6)  ! * Amiodarone 7)  ! * Duragesic 8)  ! * Spironolactone 9)  Levaquin  Medication Administration  Injection # 1:    Medication: Vit B12 1000 mcg    Diagnosis: B12 DEFICIENCY (ICD-266.2)    Route: IM    Site: L deltoid    Exp Date: 12/01/2010    Lot #: [id number]    Mfr: American Regent    Patient tolerated injection without complications    Given by: Margaret Pyle, CMA (April 29, 2010 8:21 AM)  Orders Added: 1)  Admin of patients own med IM/SQ [phone]

## 2010-10-03 NOTE — Progress Notes (Signed)
Summary: Prolia approved  Phone Note Outgoing Call Call back at [facility] Phone [phone]   Call placed by: Lucious Groves,  February 28, 2010 11:54 AM Details of Request: [id number] Summary of Call: Prolia was approved with $0 out of pocket for the patient. Called to notify patient and was directed to call [phone]. No answer/no machine. Initial call taken by: Lucious Groves,  February 28, 2010 11:54 AM  Follow-up for Phone Call        Pt's daughter's number is [phone] [facility] Follow-up by: Margaret Pyle, CMA,  February 28, 2010 1:59 PM  Additional Follow-up for Phone Call Additional follow up Details #1::        Kendal Hymen notified and will most likely do Prolia the same time as B-12 inj. Kendal Hymen is aware to call one week before so we can order the Prolia. Additional Follow-up by: Lucious Groves,  March 01, 2010 11:40 AM    Additional Follow-up for Phone Call Additional follow up Details #2::    noted Follow-up by: Corwin Levins MD,  March 01, 2010 12:52 PM  New/Updated Medications: PROLIA 60 MG/ML SOLN (DENOSUMAB) q 89mo

## 2010-10-03 NOTE — Medication Information (Signed)
Summary: Benefits/Prolia  Benefits/Prolia   Imported By: Lester [place] 09/24/2010 12:18:50  _____________________________________________________________________  External Attachment:    Type:   Image     Comment:   External Document

## 2010-10-03 NOTE — Letter (Signed)
Summary: [facility] / [place] [facility]  [facility] / [place] [facility]   Imported By: Lennie Odor 05/02/2010 11:17:10  _____________________________________________________________________  External Attachment:    Type:   Image     Comment:   External Document

## 2010-10-03 NOTE — Medication Information (Signed)
Summary: rov/sl  Anticoagulant Therapy  Managed by: Bethena Midget, RN, BSN Referring MD: Graciela Husbands PCP: Corwin Levins MD Supervising MD: Daleen Squibb MD, Maisie Fus Indication 1: Pulmonary embolus (ICD-415.19) Lab Used: [facility] [place] Site: [address] INR POC 1.7 INR RANGE 2 - 3  Dietary changes: no    Health status changes: no    Bleeding/hemorrhagic complications: no    Recent/future hospitalizations: no    Any changes in medication regimen? no    Recent/future dental: no  Any missed doses?: no       Is patient compliant with meds? yes       Current Medications (verified): 1)  Accu-Chek Compact Test Drum  Strp (Glucose Blood) .Marland Kitchen.. 1 Two Times A Day Use Asd - Dx Code 250.02 2)  Labetalol Hcl 200 Mg  Tabs (Labetalol Hcl) .... 2 By Mouth Two Times A Day 3)  Catapres-Tts-2 0.2 Mg/24hr Ptwk (Clonidine Hcl) .Marland Kitchen.. 1 Patch Q Wk 4)  Ferrous Sulfate 325 (65 Fe) Mg Tabs (Ferrous Sulfate) .... Take 1 Tablet By Mouth Twice A  Day 5)  Fosamax 70 Mg Tabs (Alendronate Sodium) .Marland Kitchen.. 1 Tablet By Mouth Once A Week 6)  Glimepiride 4 Mg Tabs (Glimepiride) .... Take 1 By Mouth Once Daily Only 7)  Lantus 100 Unit/ml Soln (Insulin Glargine) .... Inject 8 Unit Subcutaneously Once A Day 8)  Metformin Hcl 500 Mg Tabs (Metformin Hcl) .Marland Kitchen.. 1 By Mouth Bid 9)  Pantoprazole Sodium 40 Mg  Tbec (Pantoprazole Sodium) .Marland Kitchen.. 1po Qd 10)  Warfarin Sodium 5 Mg Tabs (Warfarin Sodium) .... Take As Directed By Coumadin Clinic. 11)  Bd Insulin Syringe Ultrafine 31g X 5/16" 0.5 Ml  Misc (Insulin Syringe-Needle U-100) .... Use 1 Qd 12)  Hydrocodone-Acetaminophen 5-325 Mg Tabs (Hydrocodone-Acetaminophen) .... Taking 1/2-1 Tablet Daily 13)  Stool Softner .... As Needed 14)  Pravachol 20 Mg Tabs (Pravastatin Sodium) .... 1/2 Tablet Once Daily 15)  Bactrim Ds 800-160 Mg Tabs (Sulfamethoxazole-Trimethoprim) .Marland Kitchen.. 1po Once Daily 16)  Fexofenadine Hcl 180 Mg Tabs (Fexofenadine Hcl) .Marland Kitchen.. 1po Once Daily As Needed  Allergies 17)  Gabapentin 300 Mg Caps (Gabapentin) .Marland Kitchen.. 1 By Mouth Daily 18)  Nasonex 50 Mcg/act Susp (Mometasone Furoate) .... 2 Spray/side Per Day 19)  Furosemide 20 Mg Tabs (Furosemide) .Marland Kitchen.. 1 By Mouth Every Other Day 20)  Cyanocobalamin 1000 Mcg/ml Soln (Cyanocobalamin) .... Inject 1037mcg/ml Once A Month 21)  3ml 25g 5/8 (0.50mm X 16mm) .... Use As Directed 22)  Prolia 60 Mg/ml Soln (Denosumab) .... Q 63mo 23)  Tramadol Hcl 50 Mg Tabs (Tramadol Hcl) .... As Needed 24)  Cipro  As Directed .... Uti  Allergies: 1)  ! Valium 2)  ! Cardizem 3)  ! * Deltasone 4)  ! * Tequin 5)  ! * Lyrica 6)  ! * Amiodarone 7)  ! * Duragesic 8)  ! * Spironolactone 9)  Levaquin  Anticoagulation Management History:      The patient is taking warfarin and comes in today for a routine follow up visit.  Positive risk factors for bleeding include an age of 75 years or older and presence of serious comorbidities.  The bleeding index is 'intermediate risk'.  Positive CHADS2 values include History of CHF, History of HTN, Age > 75 years old, and History of Diabetes.  The start date was 03/26/2005.  Her last INR was 5.8 RATIO.  Anticoagulation responsible Socrates Cahoon: Daleen Squibb MD, Maisie Fus.  INR POC: 1.7.  Cuvette Lot#: [id number].  Exp: 10/2011.    Anticoagulation  Management Assessment/Plan:      The patient's current anticoagulation dose is Warfarin sodium 5 mg tabs: Take as directed by coumadin clinic..  The target INR is 2 - 3.  The next INR is due 08/29/2010.  Anticoagulation instructions were given to patient/daughters.  Results were reviewed/authorized by Bethena Midget, RN, BSN.  She was notified by Bethena Midget, RN, BSN.         Prior Anticoagulation Instructions: INR 2.1  Continue taking Coumadin 0.5 tab (2.5 mg) on Sun, Tues, Thur and Coumadin 1 tab (5 mg) on Mon, Wed, Fri, Sat. Return to clinic in 4 weeks.   Current Anticoagulation Instructions: INR 1.7 Today take 1.5 pills then change dose to 1 pill everyday  except 1/2 pill on Tuesdays and Thursdays. Recheck in 2 weeks.

## 2010-10-03 NOTE — Assessment & Plan Note (Signed)
Summary: B-12 Sammuel Cooper Natale Milch   Nurse Visit   Allergies: 1)  ! Valium 2)  ! Cardizem 3)  ! * Deltasone 4)  ! * Tequin 5)  ! * Lyrica 6)  ! * Amiodarone 7)  ! * Duragesic 8)  ! * Spironolactone 9)  Levaquin  Medication Administration  Injection # 1:    Medication: Vit B12 1000 mcg    Diagnosis: B12 DEFICIENCY (ICD-266.2)    Route: IM    Site: L deltoid    Exp Date: 06/02/2011    Lot #: [id number]    Mfr: American Regent    Comments: Injection given by Sydell Axon SMA    Patient tolerated injection without complications    Given by: Lucious Groves (November 13, 2009 10:11 AM)  Orders Added: 1)  Admin of Therapeutic Inj  intramuscular or subcutaneous [96372] 2)  Vit B12 1000 mcg [J3420]

## 2010-10-03 NOTE — Progress Notes (Signed)
Summary: Rx refill req  Phone Note Call from Patient Call back at Home Phone [phone]   Caller: Daughter Kendal Hymen [phone] Summary of Call: Pt's daughter called requesting refills of pt's medications to [facility] mail order pharmacy. Initial call taken by: Margaret Pyle, CMA,  February 28, 2010 10:26 AM    Prescriptions: PANTOPRAZOLE SODIUM 40 MG  TBEC (PANTOPRAZOLE SODIUM) 1po qd  #90 x 3   Entered by:   Margaret Pyle, CMA   Authorized by:   Corwin Levins MD   Signed by:   Margaret Pyle, CMA on 02/28/2010   Method used:   Faxed to ...       [facility] (mail-order)             , [place]         Ph: [phone]       Fax: [phone]   RxID:   [id number] GLIMEPIRIDE 4 MG TABS (GLIMEPIRIDE) Take 1 by mouth once daily only  #90 x 3   Entered by:   Margaret Pyle, CMA   Authorized by:   Corwin Levins MD   Signed by:   Margaret Pyle, CMA on 02/28/2010   Method used:   Faxed to ...       [facility] (mail-order)             , [place]         Ph: [phone]       Fax: [phone]   RxID:   [id number] METFORMIN HCL 500 MG TABS (METFORMIN HCL) 1 by mouth bid  #180 x 3   Entered by:   Margaret Pyle, CMA   Authorized by:   Corwin Levins MD   Signed by:   Margaret Pyle, CMA on 02/28/2010   Method used:   Faxed to ...       [facility] (mail-order)             , [place]         Ph: [phone]       Fax: [phone]   RxID:   [id number] BACTRIM DS 800-160 MG TABS (SULFAMETHOXAZOLE-TRIMETHOPRIM) 1po once daily  #90 x 3   Entered by:   Margaret Pyle, CMA   Authorized by:   Corwin Levins MD   Signed by:   Margaret Pyle, CMA on 02/28/2010   Method used:   Faxed to ...       [facility] (mail-order)             , [place]         Ph: [phone]       Fax: [phone]   RxID:   [id number] LABETALOL HCL 200 MG  TABS (LABETALOL HCL) 2 by mouth two times a day  #360 x 3   Entered by:   Margaret Pyle, CMA  Authorized by:   Corwin Levins MD   Signed by:   Margaret Pyle, CMA on 02/28/2010   Method used:   Faxed to ...       [facility] (mail-order)             , [place]         Ph: [phone]       Fax: [phone]   RxID:   [id number] SERTRALINE HCL 50 MG  TABS (SERTRALINE HCL) 1po once daily  #90 x 3   Entered by:   Margaret Pyle, CMA   Authorized by:   Corwin Levins MD   Signed by:  Margaret Pyle, CMA on 02/28/2010   Method used:   Faxed to ...       [facility] (mail-order)             , [place]         Ph: [phone]       Fax: [phone]   RxID:   [id number] FELODIPINE 10 MG XR24H-TAB (FELODIPINE) 1 by mouth daily  #90 x 3   Entered by:   Margaret Pyle, CMA   Authorized by:   Corwin Levins MD   Signed by:   Margaret Pyle, CMA on 02/28/2010   Method used:   Faxed to ...       [facility] (mail-order)             , [place]         Ph: [phone]       Fax: [phone]   RxID:   [id number] PRAVACHOL 20 MG TABS (PRAVASTATIN SODIUM) 1/2 tablet once daily  #45 x 3   Entered by:   Margaret Pyle, CMA   Authorized by:   Corwin Levins MD   Signed by:   Margaret Pyle, CMA on 02/28/2010   Method used:   Faxed to ...       [facility] (mail-order)             , [place]         Ph: [phone]       Fax: [phone]   RxID:   [id number] FEXOFENADINE HCL 180 MG TABS (FEXOFENADINE HCL) 1po once daily as needed allergies  #90 x 3   Entered by:   Margaret Pyle, CMA   Authorized by:   Corwin Levins MD   Signed by:   Margaret Pyle, CMA on 02/28/2010   Method used:   Faxed to ...       [facility] (mail-order)             , [place]         Ph: [phone]       Fax: [phone]   RxID:   [id number] GABAPENTIN 300 MG CAPS (GABAPENTIN) 1 by mouth three times a day  #270 x 3   Entered by:   Margaret Pyle, CMA   Authorized by:   Corwin Levins MD   Signed by:   Margaret Pyle, CMA on 02/28/2010   Method  used:   Faxed to ...       [facility] (mail-order)             , [place]         Ph: [phone]       Fax: [phone]   RxID:   [id number] FUROSEMIDE 20 MG TABS (FUROSEMIDE) 1 by mouth daily  #90 x 3   Entered by:   Margaret Pyle, CMA   Authorized by:   Corwin Levins MD   Signed by:   Margaret Pyle, CMA on 02/28/2010   Method used:   Faxed to ...       [facility] (mail-order)             , [place]         Ph: [phone]       Fax: [phone]   RxID:   [id number]

## 2010-10-03 NOTE — Medication Information (Signed)
Summary: Prolia Ins Request form  Prolia Ins Request form   Imported By: Lester [place] 09/17/2010 10:16:51  _____________________________________________________________________  External Attachment:    Type:   Image     Comment:   External Document

## 2010-10-03 NOTE — Letter (Signed)
Summary: [facility]  [facility]   Imported By: Sherian Rein 05/14/2010 08:30:56  _____________________________________________________________________  External Attachment:    Type:   Image     Comment:   External Document

## 2010-10-03 NOTE — Assessment & Plan Note (Signed)
Summary: PER DAUGHTER POST HOSPITAL/MILD HEART ATTACK-LB   Vital Signs:  Patient profile:   77 year old female Height:      66.5 inches Weight:      133.75 pounds O2 Sat:      98 % on Room air Temp:     98.2 degrees F oral Pulse rate:   78 / minute BP sitting:   110 / 60  (left arm) Cuff size:   regular  Vitals Entered ByZella Ball Ewing (November 02, 2009 9:13 AM)  O2 Flow:  Room air  CC: Post Hospital/RE, Lipid Management   CC:  Post Hospital/RE and Lipid Management.  History of Present Illness: here after recent NSTEMI and UTI ; now back on bactrim daily; Pt denies CP, sob, doe, wheezing, orthopnea, pnd, worsening LE edema, palps, dizziness or syncope   Pt denies new neuro symptoms such as headache, facial or extremity weakness No GU symtpoms such as dysuria, freq or urgency.  Does have increased 1 wk nasal allergy symtpoms without fever, pain and drainage is clear.  Also c/o persistent right facial pain in the distribution of the hx of shingles - wants to re-start the gabapentin as this helped in the past.  No new complaints.  Here for wellness Diet: Heart Healthy or DM if diabetic Physical Activities: Sedentary Depression/mood screen: Negative Hearing: mild to mod decreased bilat, declines ENT eval Visual Acuity: Grossly normal, wears glasses ADL's: Capable  Fall Risk: mild Home Safety: Good End-of-Life Planning: Advance directive - Full code/I agree    Problems Prior to Update: 1)  Allergic Rhinitis  (ICD-477.9) 2)  Coronary Artery Disease  (ICD-414.00) 3)  Myocardial Infarction, Hx of  (ICD-412) 4)  Renal Insufficiency  (ICD-588.9) 5)  Dysuria  (ICD-788.1) 6)  Laceration, Forehead  (ICD-873.42) 7)  Fatigue  (ICD-780.79) 8)  Blepharitis, Bilateral  (ICD-373.00) 9)  Postural Hypotension  (ICD-458.0) 10)  Syncope  (ICD-780.2) 11)  Depressive Disorder  (ICD-311) 12)  Constipation  (ICD-564.00) 13)  Uti  (ICD-599.0) 14)  Epigastric Tenderness  (ICD-789.66) 15)  Uti   (ICD-599.0) 16)  Renal Cyst, Left  (ICD-593.2) 17)  Abdominal Pain, Epigastric  (ICD-789.06) 18)  Dysuria  (ICD-788.1) 19)  Fatigue  (ICD-780.79) 20)  Diabetes Mellitus, Type II  (ICD-250.00) 21)  Back Pain  (ICD-724.5) 22)  Family History Diabetes 1st Degree Relative  (ICD-V18.0) 23)  Shingles, Hx of  (ICD-V13.8) 24)  Pulmonary Embolism, Hx of  (ICD-V12.51) 25)  Osteoporosis  (ICD-733.00) 26)  Hyperlipidemia  (ICD-272.4) 27)  Atrial Fibrillation  (ICD-427.31) 28)  Anticoagulation Therapy  (ICD-V58.61) 29)  B12 Deficiency  (ICD-266.2) 30)  Psvt  (ICD-427.0) 31)  Hypertension  (ICD-401.9) 32)  Gerd  (ICD-530.81) 33)  Anemia-iron Deficiency  (ICD-280.9)  Medications Prior to Update: 1)  Accu-Chek Compact Test Drum  Strp (Glucose Blood) .Marland Kitchen.. 1 Two Times A Day Use Asd - Dx Code 250.02 2)  Labetalol Hcl 200 Mg  Tabs (Labetalol Hcl) .Marland Kitchen.. 1 By Mouth Two Times A Day 3)  Catapres-Tts-3 0.3 Mg/24hr Ptwk (Clonidine Hcl) .... Apply Once Weekly 4)  Ferrous Sulfate 325 (65 Fe) Mg Tabs (Ferrous Sulfate) .... Take 1 Tablet By Mouth Twice A  Day 5)  Fosamax 70 Mg Tabs (Alendronate Sodium) .Marland Kitchen.. 1 Tablet By Mouth Once A Week 6)  Glimepiride 4 Mg Tabs (Glimepiride) .... Take 1 By Mouth Once Daily Only 7)  Lantus 100 Unit/ml Soln (Insulin Glargine) .... Inject 8 Unit Subcutaneously Once A Day 8)  Metformin Hcl 500  Mg Tabs (Metformin Hcl) .Marland Kitchen.. 1 By Mouth Bid 9)  Pantoprazole Sodium 40 Mg  Tbec (Pantoprazole Sodium) .Marland Kitchen.. 1po Qd 10)  Warfarin Sodium 5 Mg Tabs (Warfarin Sodium) .... Take As Directed By Coumadin Clinic. 11)  Bd Insulin Syringe Ultrafine 31g X 5/16" 0.5 Ml  Misc (Insulin Syringe-Needle U-100) .... Use 1 Qd 12)  Sertraline Hcl 50 Mg  Tabs (Sertraline Hcl) .Marland Kitchen.. 1po Once Daily 13)  Kionex 15 Gm/81ml Susp (Sodium Polystyrene Sulfonate) .Marland Kitchen.. 1 At Bedtime 14)  Felodipine 10 Mg Xr24h-Tab (Felodipine) .Marland Kitchen.. 1 By Mouth Daily 15)  Tramadol Hcl 50 Mg Tabs (Tramadol Hcl) .Marland Kitchen.. 1 - 2 By Mouth Two Times A  Day As Needed 16)  Stool Softner .... As Needed 17)  Pravastatin Sodium 40 Mg Tabs (Pravastatin Sodium) .... Take 1 By Mouth Qd 18)  Bactrim Ds 800-160 Mg Tabs (Sulfamethoxazole-Trimethoprim) .Marland Kitchen.. 1po Once Daily  Current Medications (verified): 1)  Accu-Chek Compact Test Drum  Strp (Glucose Blood) .Marland Kitchen.. 1 Two Times A Day Use Asd - Dx Code 250.02 2)  Labetalol Hcl 200 Mg  Tabs (Labetalol Hcl) .... 2 By Mouth Two Times A Day 3)  Catapres-Tts-3 0.3 Mg/24hr Ptwk (Clonidine Hcl) .... Apply Once Weekly 4)  Ferrous Sulfate 325 (65 Fe) Mg Tabs (Ferrous Sulfate) .... Take 1 Tablet By Mouth Twice A  Day 5)  Fosamax 70 Mg Tabs (Alendronate Sodium) .Marland Kitchen.. 1 Tablet By Mouth Once A Week 6)  Glimepiride 4 Mg Tabs (Glimepiride) .... Take 1 By Mouth Once Daily Only 7)  Lantus 100 Unit/ml Soln (Insulin Glargine) .... Inject 8 Unit Subcutaneously Once A Day 8)  Metformin Hcl 500 Mg Tabs (Metformin Hcl) .Marland Kitchen.. 1 By Mouth Bid 9)  Pantoprazole Sodium 40 Mg  Tbec (Pantoprazole Sodium) .Marland Kitchen.. 1po Qd 10)  Warfarin Sodium 5 Mg Tabs (Warfarin Sodium) .... Take As Directed By Coumadin Clinic. 11)  Bd Insulin Syringe Ultrafine 31g X 5/16" 0.5 Ml  Misc (Insulin Syringe-Needle U-100) .... Use 1 Qd 12)  Sertraline Hcl 50 Mg  Tabs (Sertraline Hcl) .Marland Kitchen.. 1po Once Daily 13)  Kionex 15 Gm/15ml Susp (Sodium Polystyrene Sulfonate) .Marland Kitchen.. 1 At Bedtime 14)  Felodipine 10 Mg Xr24h-Tab (Felodipine) .Marland Kitchen.. 1 By Mouth Daily 15)  Tramadol Hcl 50 Mg Tabs (Tramadol Hcl) .Marland Kitchen.. 1 - 2 By Mouth Two Times A Day As Needed 16)  Stool Softner .... As Needed 17)  Pravastatin Sodium 40 Mg Tabs (Pravastatin Sodium) .... Take 1 By Mouth Qd 18)  Bactrim Ds 800-160 Mg Tabs (Sulfamethoxazole-Trimethoprim) .Marland Kitchen.. 1po Once Daily 19)  Aspir-Low 81 Mg Tbec (Aspirin) .Marland Kitchen.. 1po Once Daily 20)  Fexofenadine Hcl 180 Mg Tabs (Fexofenadine Hcl) .Marland Kitchen.. 1po Once Daily As Needed Allergies 21)  Gabapentin 300 Mg Caps (Gabapentin) .Marland Kitchen.. 1 By Mouth Three Times A Day 22)  Nasonex 50  Mcg/act Susp (Mometasone Furoate) .... 2 Spray/side Per Day  Allergies (verified): 1)  ! Valium 2)  ! Cardizem 3)  ! * Deltasone 4)  ! * Tequin 5)  ! * Lyrica 6)  ! * Amiodarone 7)  ! * Duragesic 8)  ! * Spironolactone 9)  Levaquin  Past History:  Past Surgical History: Last updated: 09/06/2007 Appendectomy Hysterectomy  Family History: Last updated: 08-25-07 brother died with esophageal perforation Family History Diabetes 1st degree relative - 2 sister Family History Hypertension Mother with breast cancer father with colon cancer, died with stroke postop  Social History: Last updated: 06/21/2008 Never Smoked Alcohol use-no Retired with 2 daughters today Drug use-no Regular exercise-yes  Risk Factors: Exercise: yes (06/21/2008)  Risk Factors: Smoking Status: never (08/02/2007) Passive Smoke Exposure: no (06/21/2008)  Past Medical History: Anemia-iron deficiency GERDw/ stricture Hypertension B12 Deficiency Anticoagulation therapy Atrial fibrillation Hyperlipidemia Osteoporosis Pulmonary embolism, hx of hx of shingles - right facial renal cyst Diabetes mellitus, type II macular degeneration - dr Luciana Axe - s/p laser oct 2010 diabetic retinopathy -  dr Luciana Axe  -  s/p laser 2007 Renal insufficiency Myocardial infarction, hx of Coronary artery disease Allergic rhinitis  Review of Systems  The patient denies anorexia, fever, vision loss, decreased hearing, hoarseness, chest pain, syncope, dyspnea on exertion, peripheral edema, prolonged cough, headaches, hemoptysis, abdominal pain, melena, hematochezia, severe indigestion/heartburn, hematuria, incontinence, muscle weakness, suspicious skin lesions, depression, unusual weight change, abnormal bleeding, enlarged lymph nodes, and angioedema.         all otherwise negative per pt -  Physical Exam  General:  alert and well-developed.   Head:  normocephalic and atraumatic.   Eyes:  vision grossly intact,  pupils equal, and pupils round.   Ears:  R ear normal and L ear normal.   Nose:  nasal dischargemucosal pallor and mucosal edema.   Mouth:  pharyngeal erythema and fair dentition.   Neck:  supple and no masses.   Lungs:  normal respiratory effort and normal breath sounds.   Heart:  normal rate and irregular rhythm.   Abdomen:  soft, non-tender, and normal bowel sounds.   Msk:  no joint tenderness and no joint swelling.   Extremities:  no edema, no erythema  Neurologic:  cranial nerves II-XII intact and strength normal in all extremities.   Skin:  color normal and no rashes.   Psych:  not anxious appearing and not depressed appearing.     Impression & Recommendations:  Problem # 1:  Preventive Health Care (ICD-V70.0)  Overall doing well, age appropriate education and counseling updated and referral for appropriate preventive services done unless declined, immunizations up to date or declined, diet counseling done if overweight, urged to quit smoking if smokes , most recent labs reviewed and current ordered if appropriate, ecg reviewed or declined (interpretation per ECG scanned in the EMR if done); information regarding Medicare Prevention requirements given if appropriate   Orders: First annual wellness visit with prevention plan  ([id number])  Problem # 2:  CORONARY ARTERY DISEASE (ICD-414.00)  Her updated medication list for this problem includes:    Labetalol Hcl 200 Mg Tabs (Labetalol hcl) .Marland Kitchen... 2 by mouth two times a day    Catapres-tts-3 0.3 Mg/24hr Ptwk (Clonidine hcl) .Marland Kitchen... Apply once weekly    Felodipine 10 Mg Xr24h-tab (Felodipine) .Marland Kitchen... 1 by mouth daily    Aspir-low 81 Mg Tbec (Aspirin) .Marland Kitchen... 1po once daily stable overall by hx and exam, ok to continue meds/tx as is   Problem # 3:  ATRIAL FIBRILLATION (ICD-427.31)  Her updated medication list for this problem includes:    Labetalol Hcl 200 Mg Tabs (Labetalol hcl) .Marland Kitchen... 2 by mouth two times a day    Warfarin Sodium 5 Mg Tabs  (Warfarin sodium) .Marland Kitchen... Take as directed by coumadin clinic.    Felodipine 10 Mg Xr24h-tab (Felodipine) .Marland Kitchen... 1 by mouth daily    Aspir-low 81 Mg Tbec (Aspirin) .Marland Kitchen... 1po once daily  Reviewed the following: PT: 21.0 (03/29/2009)   INR: 5.8 RATIO (02/10/2008) Coumadin Dose (weekly): 27.50 mg (10/30/2009) Prior Coumadin Dose (weekly): 27.50 mg (10/30/2009) Next Protime: 11/13/2009 (dated on 10/30/2009) stable overall by hx and exam, ok to continue meds/tx as  is   Problem # 4:  UTI (ICD-599.0)  Her updated medication list for this problem includes:    Bactrim Ds 800-160 Mg Tabs (Sulfamethoxazole-trimethoprim) .Marland Kitchen... 1po once daily stable overall by hx and exam, ok to continue meds/tx as is , decliens further urine studies today, cont suppressive tx  Problem # 5:  ALLERGIC RHINITIS (ICD-477.9)  Her updated medication list for this problem includes:    Fexofenadine Hcl 180 Mg Tabs (Fexofenadine hcl) .Marland Kitchen... 1po once daily as needed allergies    Nasonex 50 Mcg/act Susp (Mometasone furoate) .Marland Kitchen... 2 spray/side per day  Orders: Prescription Created Electronically [phone]) treat as above, f/u any worsening signs or symptoms   Problem # 6:  POSTHERPETIC NEURALGIA (ICD-053.19) to re-start the gabapentin asd  Complete Medication List: 1)  Accu-chek Compact Test Drum Strp (Glucose blood) .Marland Kitchen.. 1 two times a day use asd - dx code 250.02 2)  Labetalol Hcl 200 Mg Tabs (Labetalol hcl) .... 2 by mouth two times a day 3)  Catapres-tts-3 0.3 Mg/24hr Ptwk (Clonidine hcl) .... Apply once weekly 4)  Ferrous Sulfate 325 (65 Fe) Mg Tabs (Ferrous sulfate) .... Take 1 tablet by mouth twice a  day 5)  Fosamax 70 Mg Tabs (Alendronate sodium) .Marland Kitchen.. 1 tablet by mouth once a week 6)  Glimepiride 4 Mg Tabs (Glimepiride) .... Take 1 by mouth once daily only 7)  Lantus 100 Unit/ml Soln (Insulin glargine) .... Inject 8 unit subcutaneously once a day 8)  Metformin Hcl 500 Mg Tabs (Metformin hcl) .Marland Kitchen.. 1 by mouth bid 9)   Pantoprazole Sodium 40 Mg Tbec (Pantoprazole sodium) .Marland Kitchen.. 1po qd 10)  Warfarin Sodium 5 Mg Tabs (Warfarin sodium) .... Take as directed by coumadin clinic. 11)  Bd Insulin Syringe Ultrafine 31g X 5/16" 0.5 Ml Misc (Insulin syringe-needle u-100) .... Use 1 qd 12)  Sertraline Hcl 50 Mg Tabs (Sertraline hcl) .Marland Kitchen.. 1po once daily 13)  Kionex 15 Gm/45ml Susp (Sodium polystyrene sulfonate) .Marland Kitchen.. 1 at bedtime 14)  Felodipine 10 Mg Xr24h-tab (Felodipine) .Marland Kitchen.. 1 by mouth daily 15)  Tramadol Hcl 50 Mg Tabs (Tramadol hcl) .Marland Kitchen.. 1 - 2 by mouth two times a day as needed 16)  Stool Softner  .... As needed 17)  Pravastatin Sodium 40 Mg Tabs (Pravastatin sodium) .... Take 1 by mouth qd 18)  Bactrim Ds 800-160 Mg Tabs (Sulfamethoxazole-trimethoprim) .Marland Kitchen.. 1po once daily 19)  Aspir-low 81 Mg Tbec (Aspirin) .Marland Kitchen.. 1po once daily 20)  Fexofenadine Hcl 180 Mg Tabs (Fexofenadine hcl) .Marland Kitchen.. 1po once daily as needed allergies 21)  Gabapentin 300 Mg Caps (Gabapentin) .Marland Kitchen.. 1 by mouth three times a day 22)  Nasonex 50 Mcg/act Susp (Mometasone furoate) .... 2 spray/side per day  Patient Instructions: 1)  please take the nasonex sample at 2 spray/side per day 2)  Please take all new medications as prescribed - the generic allegra, the nasonex, and gabapentin 3)  start the gabapentin at 300 mg per night for 2 nights, then twice per day for 2 days, then 3 times per day (go more slowly if has sleepiness with the medication) 4)  Continue all previous medications as before this visit  5)  Please schedule a follow-up appointment in 3 months or sooner if needed Prescriptions: LABETALOL HCL 200 MG  TABS (LABETALOL HCL) 2 by mouth two times a day  #120 x 11   Entered and Authorized by:   Corwin Levins MD   Signed by:   Corwin Levins MD on 11/02/2009   Method  used:   Print then Give to Patient   RxID:   [id number] NASONEX 50 MCG/ACT SUSP (MOMETASONE FUROATE) 2 spray/side per day  #1 x 11   Entered and Authorized by:   Corwin Levins MD   Signed by:   Corwin Levins MD on 11/02/2009   Method used:   Print then Give to Patient   RxID:   [id number] GABAPENTIN 300 MG CAPS (GABAPENTIN) 1 by mouth three times a day  #90 x 5   Entered and Authorized by:   Corwin Levins MD   Signed by:   Corwin Levins MD on 11/02/2009   Method used:   Print then Give to Patient   RxID:   [id number] FEXOFENADINE HCL 180 MG TABS (FEXOFENADINE HCL) 1po once daily as needed allergies  #90 x 3   Entered and Authorized by:   Corwin Levins MD   Signed by:   Corwin Levins MD on 11/02/2009   Method used:   Print then Give to Patient   RxID:   [id number] LABETALOL HCL 200 MG  TABS (LABETALOL HCL) 2 by mouth two times a day  #120 x 11   Entered and Authorized by:   Corwin Levins MD   Signed by:   Corwin Levins MD on 11/02/2009   Method used:   Electronically to        [facility]. [address] [phone]* (retail)       [address] [place], [place]  [id number]       Ph: [phone]       Fax: [phone]   RxID:   [id number]

## 2010-10-03 NOTE — Progress Notes (Signed)
  Phone Note Refill Request  on October 16, 2009 1:02 PM  Refills Requested: Medication #1:  GLIMEPIRIDE 4 MG TABS Take 1 by mouth once daily only   Dosage confirmed as above?Dosage Confirmed   Notes: [facility] Initial call taken by: Scharlene Gloss,  October 16, 2009 1:02 PM    Prescriptions: GLIMEPIRIDE 4 MG TABS (GLIMEPIRIDE) Take 1 by mouth once daily only  #60 Tablet x 8   Entered by:   Scharlene Gloss   Authorized by:   Corwin Levins MD   Signed by:   Scharlene Gloss on 10/16/2009   Method used:   Faxed to ...       [facility] [address] [phone]* (retail)       [address] [place], [place]  [id number]       Ph: [phone]       Fax: [phone]   RxID:   [id number]

## 2010-10-03 NOTE — Progress Notes (Signed)
Summary: RX?  Phone Note Call from Patient   Caller: Daughter Kendal Hymen [phone] Summary of Call: pt's daughter called concerned about Rx for Labetolol 400mg  by mouth two times a day RX'd in hospital at discharge. pt's daughter says that this is double previous dose from PCP and wanted to get recommendation from MD. please advise Initial call taken by: Margaret Pyle, CMA,  October 30, 2009 2:00 PM  Follow-up for Phone Call        hosp echart reviewed;  OK to cont med as is;  this was done apparently to help the heart rate, as well help prevent another MI Follow-up by: Corwin Levins MD,  October 30, 2009 2:04 PM  Additional Follow-up for Phone Call Additional follow up Details #1::        pt contacted Dr.Klein's office..per pt's daughter Dr. Deliah Boston gave Rx for 200mg  by mouth two times a day but D/C summary says for 200mg  by mouth two times a day. Additional Follow-up by: Margaret Pyle, CMA,  October 30, 2009 2:14 PM

## 2010-10-03 NOTE — Medication Information (Signed)
Summary: ROV/TM  Anticoagulant Therapy  Managed by: Eda Keys, PharmD Referring MD: Graciela Husbands PCP: Corwin Levins MD Supervising MD: Jens Som MD, Arlys John Indication 1: Pulmonary embolus (ICD-415.19) Lab Used: [facility] [place] Site: [address] INR POC 2.4 INR RANGE 2 - 3  Dietary changes: no    Health status changes: no    Bleeding/hemorrhagic complications: no    Recent/future hospitalizations: no    Any changes in medication regimen? no    Recent/future dental: no  Any missed doses?: no       Is patient compliant with meds? yes       Allergies: 1)  ! Valium 2)  ! Cardizem 3)  ! * Deltasone 4)  ! * Tequin 5)  ! * Lyrica 6)  ! * Amiodarone 7)  ! * Duragesic 8)  ! * Spironolactone 9)  Levaquin  Anticoagulation Management History:      The patient is taking warfarin and comes in today for a routine follow up visit.  Positive risk factors for bleeding include an age of 15 years or older and presence of serious comorbidities.  The bleeding index is 'intermediate risk'.  Positive CHADS2 values include History of CHF, History of HTN, Age > 70 years old, and History of Diabetes.  The start date was 03/26/2005.  Her last INR was 5.8 RATIO.  Anticoagulation responsible provider: Jens Som MD, Arlys John.  INR POC: 2.4.  Cuvette Lot#: [id number].  Exp: 10/2011.    Anticoagulation Management Assessment/Plan:      The patient's current anticoagulation dose is Warfarin sodium 5 mg tabs: Take as directed by coumadin clinic..  The target INR is 2 - 3.  The next INR is due 09/26/2010.  Anticoagulation instructions were given to patient/daughters.  Results were reviewed/authorized by Eda Keys, PharmD.  She was notified by Eda Keys.         Prior Anticoagulation Instructions: INR 1.7 Today take 1.5 pills then change dose to 1 pill everyday except 1/2 pill on Tuesdays and Thursdays. Recheck in 2 weeks.   Current Anticoagulation Instructions: INR 2.4  Continue  taking 1/2 tablet on Tuesday and Thursday and 1 tablet all other days.  Return to clinic in 4 weeks.

## 2010-10-03 NOTE — Medication Information (Signed)
Summary: rov/ewj  Anticoagulant Therapy  Managed by: Cloyde Reams, RN, BSN Referring MD: Linus Orn MD: Myrtis Ser MD, Tinnie Gens Indication 1: Pulmonary embolus (ICD-415.19) Lab Used: [facility] [place] Site: [address] INR POC 1.7 INR RANGE 2 - 3  Dietary changes: no    Health status changes: no    Bleeding/hemorrhagic complications: no    Recent/future hospitalizations: no    Any changes in medication regimen? yes       Details: Resumed Tramadol.  Recent/future dental: no  Any missed doses?: no       Is patient compliant with meds? yes       Allergies: 1)  ! Valium 2)  ! Cardizem 3)  ! * Deltasone 4)  ! * Tequin 5)  ! * Lyrica 6)  ! * Amiodarone 7)  ! * Duragesic 8)  ! * Spironolactone 9)  Levaquin  Anticoagulation Management History:      The patient is taking warfarin and comes in today for a routine follow up visit.  Positive risk factors for bleeding include an age of 16 years or older and presence of serious comorbidities.  The bleeding index is 'intermediate risk'.  Positive CHADS2 values include History of CHF, History of HTN, Age > 53 years old, and History of Diabetes.  The start date was 03/26/2005.  Her last INR was 5.8 RATIO.  Anticoagulation responsible provider: Myrtis Ser MD, Tinnie Gens.  INR POC: 1.7.  Cuvette Lot#: [id number].  Exp: 06/2011.    Anticoagulation Management Assessment/Plan:      The patient's current anticoagulation dose is Warfarin sodium 5 mg tabs: Take as directed by coumadin clinic..  The target INR is 2 - 3.  The next INR is due 05/21/2010.  Anticoagulation instructions were given to home health nurse.  Results were reviewed/authorized by Cloyde Reams, RN, BSN.  She was notified by Cloyde Reams RN.         Prior Anticoagulation Instructions: INR 2.3  Continue on same dosage 1/2 tablet daily except 1 tablet on Mondays and Fridays.  Recheck in 3-4 weeks.    Current Anticoagulation Instructions: INR 1.7  Start taking  1/2 tablet daily except 1 tablet on Mondays, Wednesdays and Fridays.  Recheck in 3 weeks.

## 2010-10-03 NOTE — Medication Information (Signed)
Summary: rov/ewj  Anticoagulant Therapy  Managed by: Weston Brass, PharmD Referring MD: Linus Orn MD: Gala Romney MD, Reuel Boom Indication 1: Pulmonary embolus (ICD-415.19) Lab Used: [facility] [place] Site: [address] INR RANGE 2 - 3  Dietary changes: no    Health status changes: no    Bleeding/hemorrhagic complications: no    Recent/future hospitalizations: yes       Details: Skin cancer removed from nose on October 6th  Any changes in medication regimen? yes       Details: Clonidine patch strength decreased from 0.3mg  to 0.2mg     Recent/future dental: no  Any missed doses?: no       Is patient compliant with meds? yes       Allergies: 1)  ! Valium 2)  ! Cardizem 3)  ! * Deltasone 4)  ! * Tequin 5)  ! * Lyrica 6)  ! * Amiodarone 7)  ! * Duragesic 8)  ! * Spironolactone 9)  Levaquin  Anticoagulation Management History:      The patient is taking warfarin and comes in today for a routine follow up visit.  Positive risk factors for bleeding include an age of 17 years or older and presence of serious comorbidities.  The bleeding index is 'intermediate risk'.  Positive CHADS2 values include History of CHF, History of HTN, Age > 35 years old, and History of Diabetes.  The start date was 03/26/2005.  Her last INR was 5.8 RATIO.  Anticoagulation responsible provider: Bensimhon MD, Reuel Boom.  Exp: 07/2011.    Anticoagulation Management Assessment/Plan:      The patient's current anticoagulation dose is Warfarin sodium 5 mg tabs: Take as directed by coumadin clinic..  The target INR is 2 - 3.  The next INR is due 07/01/2010.  Anticoagulation instructions were given to patient/daughters.  Results were reviewed/authorized by Weston Brass, PharmD.  She was notified by Haynes Hoehn, PharmD Candidate.         Prior Anticoagulation Instructions: INR 1.9 Change dose to 1 tablet everyday except 1/2 tablet on Tuesdays, Thursdays and Sundays. Recheck in 3  weeks.  Current Anticoagulation Instructions: INR 3.3  Skip today's dose, then resume normal Coumadin schedule:  1 tablet every day of the week, except 1/2 tablet on Sunday, Tuesday, and Thursday.  Return to clinic in 2-3 weeks.

## 2010-10-03 NOTE — Medication Information (Signed)
Summary: rov/ewj  Anticoagulant Therapy  Managed by: Eda Keys, PharmD Supervising MD: Shirlee Latch MD, Dorthy Magnussen Indication 1: Pulmonary embolus (ICD-415.19) Lab Used: [facility] [place] Site: [address] INR POC 2.3 INR RANGE 2 - 3  Dietary changes: no    Health status changes: no    Bleeding/hemorrhagic complications: no    Recent/future hospitalizations: no    Any changes in medication regimen? no    Recent/future dental: no  Any missed doses?: no       Is patient compliant with meds? yes       Allergies: 1)  ! Valium 2)  ! Cardizem 3)  ! * Deltasone 4)  ! * Tequin 5)  ! * Lyrica 6)  ! * Amiodarone 7)  ! * Duragesic 8)  ! * Spironolactone 9)  Levaquin  Anticoagulation Management History:      The patient is taking warfarin and comes in today for a routine follow up visit.  Positive risk factors for bleeding include an age of 33 years or older and presence of serious comorbidities.  The bleeding index is 'intermediate risk'.  Positive CHADS2 values include History of HTN, Age > 23 years old, and History of Diabetes.  The start date was 03/26/2005.  Her last INR was 5.8 RATIO.  Anticoagulation responsible provider: Shirlee Latch MD, Nandika Stetzer.  INR POC: 2.3.  Cuvette Lot#: [id number].  Exp: 12/2010.    Anticoagulation Management Assessment/Plan:      The patient's current anticoagulation dose is Warfarin sodium 5 mg tabs: Take as directed by coumadin clinic..  The target INR is 2 - 3.  The next INR is due 12/04/2009.  Anticoagulation instructions were given to daughter/patient.  Results were reviewed/authorized by Eda Keys, PharmD.  She was notified by Eda Keys.         Prior Anticoagulation Instructions: INR 1.6  Take 1.5 tablets today, then resume same dosage 1 tablet daily except 1/2 tablet on Mondays, Wednesdays, and Fridays.  Recheck in 2 weeks.    Current Anticoagulation Instructions: INR 2.3  Continue 1/2 tablet on Monday, Wednesday, and  Friday and 1 tablet all other days.  Return to clinic in 3 weeks.

## 2010-10-03 NOTE — Progress Notes (Signed)
Summary: Rx request  Phone Note Call from Patient   Caller: Daughter Kendal Hymen [phone] Summary of Call: pt's daughter called stating that pt was Rx'd Macrobid as a prophalaxsis against UTI. Rx refill req was denied.  Initial call taken by: Margaret Pyle, CMA,  Jan 16, 2010 2:02 PM    Prescriptions: BACTRIM DS 800-160 MG TABS (SULFAMETHOXAZOLE-TRIMETHOPRIM) 1po once daily  #30 Tablet x 5   Entered by:   Margaret Pyle, CMA   Authorized by:   Corwin Levins MD   Signed by:   Margaret Pyle, CMA on 01/16/2010   Method used:   Electronically to        [facility]. [address] [phone]* (retail)       [address] [place], [place]  [id number]       Ph: [phone]       Fax: [phone]   RxID:   [id number]

## 2010-10-03 NOTE — Progress Notes (Signed)
Summary: Glimepiride  Phone Note Call from Patient   Caller: Lindsay Sheppard [phone] Summary of Call: Patient daughter called stating that she thinks that has been a prescribing error. She states that the patient has always taken Glimepiride 4mg  two times a day, but her most recent prescription that they picked up was for 1 once daily. The bid rx was denied on 10-11-2009. Is it ok to fix the prescription and send new to patient pharmacy?  Initial call taken by: Lucious Groves,  October 18, 2009 3:44 PM  Follow-up for Phone Call        the current one per day is correct , and was reduced to one per day at the last OV ;  ok for routine refill at one in the AM only Follow-up by: Corwin Levins MD,  October 18, 2009 5:12 PM  Additional Follow-up for Phone Call Additional follow up Details #1::        Patient daughter notified and she is adament that this is an entering error and that MD did not change this prescription. Please advise. Additional Follow-up by: Lucious Groves,  October 19, 2009 10:03 AM    Additional Follow-up for Phone Call Additional follow up Details #2::    this is not an error;  please refer to pt instructions from last OV - done hardcopy to LIM side B - dahlia  Follow-up by: Corwin Levins MD,  October 19, 2009 12:54 PM  Additional Follow-up for Phone Call Additional follow up Details #3:: Details for Additional Follow-up Action Taken: pt's daughter informed that med was decreased by Md last OV to avoid episodes of low CBG and since pt's states that her CBGs have been stable in the low 100s pt's daughter was reassured that decrease was safe. I advised however to call clinic if after decrease (daughter says that they were unaware of change) pt has high cbgs. Additional Follow-up by: Margaret Pyle, CMA,  October 19, 2009 3:12 PM

## 2010-10-03 NOTE — Assessment & Plan Note (Signed)
Summary: [name]   Nurse Visit   Allergies: 1)  ! Valium 2)  ! Cardizem 3)  ! * Deltasone 4)  ! * Tequin 5)  ! * Lyrica 6)  ! * Amiodarone 7)  ! * Duragesic 8)  ! * Spironolactone 9)  Levaquin  Medication Administration  Injection # 1:    Medication: Vit B12 1000 mcg    Diagnosis: B12 DEFICIENCY (ICD-266.2)    Route: IM    Site: R deltoid    Exp Date: 04/01/2012    Lot #: [id number]    Mfr: American Regent    Patient tolerated injection without complications    Given by: Margaret Pyle, CMA (July 30, 2010 9:49 AM)  Orders Added: 1)  Admin of patients own med IM/SQ [phone]

## 2010-10-03 NOTE — Medication Information (Signed)
Summary: rov/eac  Anticoagulant Therapy  Managed by: Elaina Pattee, PharmD Supervising MD: Graciela Husbands MD, Viviann Spare Indication 1: Pulmonary embolus (ICD-415.19) Lab Used: [facility] [place] Site: [address] INR POC 1.9 INR RANGE 2 - 3  Dietary changes: no    Health status changes: no    Bleeding/hemorrhagic complications: no    Recent/future hospitalizations: yes       Details: DC on 3/21 for HF exacerbation.  Any changes in medication regimen? yes       Details: Furosemide 20 mg daily added.  Recent/future dental: no  Any missed doses?: no       Is patient compliant with meds? yes       Allergies: 1)  ! Valium 2)  ! Cardizem 3)  ! * Deltasone 4)  ! * Tequin 5)  ! * Lyrica 6)  ! * Amiodarone 7)  ! * Duragesic 8)  ! * Spironolactone 9)  Levaquin  Anticoagulation Management History:      The patient is taking warfarin and comes in today for a routine follow up visit.  Positive risk factors for bleeding include an age of 75 years or older and presence of serious comorbidities.  The bleeding index is 'intermediate risk'.  Positive CHADS2 values include History of HTN, Age > 6 years old, and History of Diabetes.  The start date was 03/26/2005.  Her last INR was 5.8 RATIO.  Anticoagulation responsible provider: Graciela Husbands MD, Viviann Spare.  INR POC: 1.9.  Cuvette Lot#: [id number].  Exp: 12/2010.    Anticoagulation Management Assessment/Plan:      The patient's current anticoagulation dose is Warfarin sodium 5 mg tabs: Take as directed by coumadin clinic..  The target INR is 2 - 3.  The next INR is due 12/20/2009.  Anticoagulation instructions were given to daughter/patient.  Results were reviewed/authorized by Elaina Pattee, PharmD.  She was notified by Elaina Pattee, PharmD.         Prior Anticoagulation Instructions: INR 2.3  Continue 1/2 tablet on Monday, Wednesday, and Friday and 1 tablet all other days.  Return to clinic in 3 weeks.    Current Anticoagulation  Instructions: INR 1.9. Take 1 tablet daily except 0.5 tablet on Mon and Fri.

## 2010-10-03 NOTE — Letter (Signed)
Summary: [facility]   Imported By: Sherian Rein 08/05/2010 07:25:42  _____________________________________________________________________  External Attachment:    Type:   Image     Comment:   External Document

## 2010-10-03 NOTE — Assessment & Plan Note (Signed)
Summary: [name]   Nurse Visit   Allergies: 1)  ! Valium 2)  ! Cardizem 3)  ! * Deltasone 4)  ! * Tequin 5)  ! * Lyrica 6)  ! * Amiodarone 7)  ! * Duragesic 8)  ! * Spironolactone 9)  Levaquin  Medication Administration  Injection # 1:    Medication: Vit B12 1000 mcg    Diagnosis: B12 DEFICIENCY (ICD-266.2)    Route: IM    Site: L deltoid    Exp Date: 01/31/2012    Lot #: [id number]    Mfr: American Regent    Patient tolerated injection without complications    Given by: Margaret Pyle, CMA (July 01, 2010 9:24 AM)  Orders Added: 1)  Admin of Therapeutic Inj  intramuscular or subcutaneous [96372] 2)  Vit B12 1000 mcg [J3420]

## 2010-10-03 NOTE — Miscellaneous (Signed)
Summary: [facility]   [facility] Orders   Imported By: Roderic Ovens 03/28/2010 15:31:29  _____________________________________________________________________  External Attachment:    Type:   Image     Comment:   External Document

## 2010-10-03 NOTE — Medication Information (Signed)
Summary: rov.mp  Anticoagulant Therapy  Managed by: Shelby Dubin, PharmD, BCPS, CPP Supervising MD: Myrtis Ser MD, Tinnie Gens Indication 1: Pulmonary embolus (ICD-415.19) Lab Used: [facility] [place] Site: [address] INR POC 1.6 INR RANGE 2 - 3  Dietary changes: no    Health status changes: no    Bleeding/hemorrhagic complications: no    Recent/future hospitalizations: no    Any changes in medication regimen? no    Recent/future dental: no  Any missed doses?: no       Is patient compliant with meds? yes       Allergies (verified): 1)  ! Valium 2)  ! Cardizem 3)  ! * Deltasone 4)  ! * Tequin 5)  ! * Lyrica 6)  ! * Amiodarone 7)  ! * Duragesic 8)  ! * Spironolactone 9)  Levaquin  Anticoagulation Management History:      The patient is taking warfarin and comes in today for a routine follow up visit.  Positive risk factors for bleeding include an age of 75 years or older and presence of serious comorbidities.  The bleeding index is 'intermediate risk'.  Positive CHADS2 values include History of HTN, Age > 17 years old, and History of Diabetes.  The start date was 03/26/2005.  Her last INR was 5.8 RATIO.  Anticoagulation responsible provider: Myrtis Ser MD, Tinnie Gens.  INR POC: 1.6.  Cuvette Lot#: [id number].  Exp: 12/2010.    Anticoagulation Management Assessment/Plan:      The patient's current anticoagulation dose is Warfarin sodium 5 mg tabs: Take as directed by coumadin clinic..  The target INR is 2 - 3.  The next INR is due 10/02/2009.  Anticoagulation instructions were given to daughter/patient.  Results were reviewed/authorized by Shelby Dubin, PharmD, BCPS, CPP.  She was notified by Ysidro Evert, Pharm D Candidate.         Prior Anticoagulation Instructions: INR 2.2  Continue taking 0.5 tab daily except 1 tab on Tuesday, Thursday, and Saturday.   Recheck in 4 weeks.   Current Anticoagulation Instructions: INR: 1.6  Increase to 5 mg = 1 tab daily for 4 days  (Wednesday, Thursday, Friday, Saturday) then resume normal dosing of 0.5 tab daily except 1 tab on Tuesday, Thursday, and Saturday.   Recheck in 10 days.

## 2010-10-03 NOTE — Medication Information (Signed)
Summary: Insurance Verification for [employer] Verification for Prolia/ProliaPlus   Imported By: Sherian Rein 02/27/2010 15:09:48  _____________________________________________________________________  External Attachment:    Type:   Image     Comment:   External Document

## 2010-10-14 DIAGNOSIS — I2699 Other pulmonary embolism without acute cor pulmonale: Secondary | ICD-10-CM

## 2010-10-14 DIAGNOSIS — I4891 Unspecified atrial fibrillation: Secondary | ICD-10-CM

## 2010-10-14 DIAGNOSIS — Z86718 Personal history of other venous thrombosis and embolism: Secondary | ICD-10-CM

## 2010-10-25 ENCOUNTER — Encounter: Payer: Self-pay | Admitting: Internal Medicine

## 2010-10-25 ENCOUNTER — Encounter (INDEPENDENT_AMBULATORY_CARE_PROVIDER_SITE_OTHER): Payer: Medicare Other

## 2010-10-25 ENCOUNTER — Ambulatory Visit (INDEPENDENT_AMBULATORY_CARE_PROVIDER_SITE_OTHER): Payer: Medicare Other

## 2010-10-25 DIAGNOSIS — E538 Deficiency of other specified B group vitamins: Secondary | ICD-10-CM

## 2010-10-25 DIAGNOSIS — Z7901 Long term (current) use of anticoagulants: Secondary | ICD-10-CM

## 2010-10-25 DIAGNOSIS — I2699 Other pulmonary embolism without acute cor pulmonale: Secondary | ICD-10-CM

## 2010-10-28 ENCOUNTER — Ambulatory Visit: Payer: Self-pay

## 2010-10-29 NOTE — Assessment & Plan Note (Signed)
Summary: B-12  Sammuel Cooper  Natale Milch   Nurse Visit   Allergies: 1)  ! Valium 2)  ! Cardizem 3)  ! * Deltasone 4)  ! * Tequin 5)  ! * Lyrica 6)  ! * Amiodarone 7)  ! * Duragesic 8)  ! * Spironolactone 9)  Levaquin  Medication Administration  Injection # 1:    Medication: Vit B12 1000 mcg    Diagnosis: B12 DEFICIENCY (ICD-266.2)    Route: IM    Site: R deltoid    Exp Date: 07/02/2012    Lot #: [id number]    Mfr: American Regent    Patient tolerated injection without complications    Given by: Margaret Pyle, CMA (October 25, 2010 9:20 AM)  Orders Added: 1)  Admin of Therapeutic Inj  intramuscular or subcutaneous [96372] 2)  Vit B12 1000 mcg [J3420]

## 2010-10-29 NOTE — Medication Information (Signed)
Summary: rov/tm  Anticoagulant Therapy  Managed by: Cloyde Reams, RN, BSN Referring MD: Graciela Husbands PCP: Corwin Levins MD Supervising MD: Tenny Craw MD, Gunnar Fusi Indication 1: Pulmonary embolus (ICD-415.19) Lab Used: [facility] [place] Site: [address] INR POC 3.2 INR RANGE 2 - 3  Dietary changes: no    Health status changes: no    Bleeding/hemorrhagic complications: no    Recent/future hospitalizations: no    Any changes in medication regimen? no    Recent/future dental: no  Any missed doses?: yes     Details: Missed 1 dosage approx 1.5 weeks ago.   Is patient compliant with meds? yes       Allergies: 1)  ! Valium 2)  ! Cardizem 3)  ! * Deltasone 4)  ! * Tequin 5)  ! * Lyrica 6)  ! * Amiodarone 7)  ! * Duragesic 8)  ! * Spironolactone 9)  Levaquin  Anticoagulation Management History:      The patient is taking warfarin and comes in today for a routine follow up visit.  Positive risk factors for bleeding include an age of 75 years or older and presence of serious comorbidities.  The bleeding index is 'intermediate risk'.  Positive CHADS2 values include History of CHF, History of HTN, Age > 66 years old, and History of Diabetes.  The start date was 03/26/2005.  Her last INR was 5.8 RATIO.  Anticoagulation responsible provider: Tenny Craw MD, Gunnar Fusi.  INR POC: 3.2.  Cuvette Lot#: [id number].  Exp: 09/2011.    Anticoagulation Management Assessment/Plan:      The patient's current anticoagulation dose is Warfarin sodium 5 mg tabs: Take as directed by coumadin clinic..  The target INR is 2 - 3.  The next INR is due 11/15/2010.  Anticoagulation instructions were given to patient/daughters.  Results were reviewed/authorized by Cloyde Reams, RN, BSN.  She was notified by Cloyde Reams RN.         Prior Anticoagulation Instructions: INR 2.6 Continue 1 pill everyday except 1/2 pill on Tuesdays and Thursdays. REcheck in 4 weeks.   Current Anticoagulation Instructions: INR  3.2  Start taking 1 tablet daily except 1/2 tablet on Tuesdays, Thursdays, and Saturdays.  Recheck in 3 weeks.

## 2010-11-13 LAB — POCT URINALYSIS DIPSTICK
Bilirubin Urine: NEGATIVE
Hgb urine dipstick: NEGATIVE
Ketones, ur: NEGATIVE mg/dL
pH: 6 (ref 5.0–8.0)

## 2010-11-13 LAB — URINE CULTURE
Colony Count: >=100000
Culture  Setup Time: 201110190107

## 2010-11-15 ENCOUNTER — Encounter (INDEPENDENT_AMBULATORY_CARE_PROVIDER_SITE_OTHER): Payer: Medicare Other

## 2010-11-15 ENCOUNTER — Encounter: Payer: Self-pay | Admitting: Cardiology

## 2010-11-15 DIAGNOSIS — Z7901 Long term (current) use of anticoagulants: Secondary | ICD-10-CM

## 2010-11-15 DIAGNOSIS — I2699 Other pulmonary embolism without acute cor pulmonale: Secondary | ICD-10-CM

## 2010-11-15 LAB — CONVERTED CEMR LAB: POC INR: 2.5

## 2010-11-18 LAB — PROTIME-INR
INR: 1.74 — ABNORMAL HIGH (ref 0.00–1.49)
INR: 1.67 — ABNORMAL HIGH (ref 0.00–1.49)
INR: 1.83 — ABNORMAL HIGH (ref 0.00–1.49)
INR: 2.95 — ABNORMAL HIGH (ref 0.00–1.49)
INR: 3.11 — ABNORMAL HIGH (ref 0.00–1.49)
INR: 3.36 — ABNORMAL HIGH (ref 0.00–1.49)
INR: 3.49 — ABNORMAL HIGH (ref 0.00–1.49)
Prothrombin Time: 20.2 seconds — ABNORMAL HIGH (ref 11.6–15.2)
Prothrombin Time: 22.6 seconds — ABNORMAL HIGH (ref 11.6–15.2)
Prothrombin Time: 19.6 seconds — ABNORMAL HIGH (ref 11.6–15.2)
Prothrombin Time: 24.1 seconds — ABNORMAL HIGH (ref 11.6–15.2)
Prothrombin Time: 31.8 seconds — ABNORMAL HIGH (ref 11.6–15.2)
Prothrombin Time: 33.8 seconds — ABNORMAL HIGH (ref 11.6–15.2)
Prothrombin Time: 34.8 seconds — ABNORMAL HIGH (ref 11.6–15.2)

## 2010-11-18 LAB — DIFFERENTIAL
Basophils Relative: 0 % (ref 0–1)
Basophils Relative: 0 % (ref 0–1)
Basophils Relative: 0 % (ref 0–1)
Eosinophils Absolute: 0 10*3/uL (ref 0.0–0.7)
Eosinophils Absolute: 0.2 10*3/uL (ref 0.0–0.7)
Eosinophils Relative: 4 % (ref 0–5)
Lymphocytes Relative: 14 % (ref 12–46)
Lymphs Abs: 0.6 10*3/uL — ABNORMAL LOW (ref 0.7–4.0)
Lymphs Abs: 0.7 10*3/uL (ref 0.7–4.0)
Lymphs Abs: 0.7 10*3/uL (ref 0.7–4.0)
Monocytes Absolute: 0.4 10*3/uL (ref 0.1–1.0)
Monocytes Absolute: 0.5 10*3/uL (ref 0.1–1.0)
Monocytes Relative: 10 % (ref 3–12)
Monocytes Relative: 10 % (ref 3–12)
Monocytes Relative: 10 % (ref 3–12)
Monocytes Relative: 7 % (ref 3–12)
Neutro Abs: 3 10*3/uL (ref 1.7–7.7)
Neutro Abs: 5 10*3/uL (ref 1.7–7.7)
Neutrophils Relative %: 69 % (ref 43–77)
Neutrophils Relative %: 72 % (ref 43–77)
Neutrophils Relative %: 87 % — ABNORMAL HIGH (ref 43–77)

## 2010-11-18 LAB — CBC
HCT: 22.2 % — ABNORMAL LOW (ref 36.0–46.0)
HCT: 32.3 % — ABNORMAL LOW (ref 36.0–46.0)
Hemoglobin: 11.1 g/dL — ABNORMAL LOW (ref 12.0–15.0)
MCHC: 32.9 g/dL (ref 30.0–36.0)
MCHC: 34.4 g/dL (ref 30.0–36.0)
MCHC: 34.5 g/dL (ref 30.0–36.0)
MCV: 85.2 fL (ref 78.0–100.0)
MCV: 86.2 fL (ref 78.0–100.0)
Platelets: 101 10*3/uL — ABNORMAL LOW (ref 150–400)
Platelets: 127 10*3/uL — ABNORMAL LOW (ref 150–400)
Platelets: 99 10*3/uL — ABNORMAL LOW (ref 150–400)
RBC: 3.5 MIL/uL — ABNORMAL LOW (ref 3.87–5.11)
RDW: 15.3 % (ref 11.5–15.5)
RDW: 16 % — ABNORMAL HIGH (ref 11.5–15.5)
WBC: 5.8 10*3/uL (ref 4.0–10.5)

## 2010-11-18 LAB — GLUCOSE, CAPILLARY
Glucose-Capillary: 100 mg/dL — ABNORMAL HIGH (ref 70–99)
Glucose-Capillary: 158 mg/dL — ABNORMAL HIGH (ref 70–99)
Glucose-Capillary: 162 mg/dL — ABNORMAL HIGH (ref 70–99)
Glucose-Capillary: 156 mg/dL — ABNORMAL HIGH (ref 70–99)
Glucose-Capillary: 160 mg/dL — ABNORMAL HIGH (ref 70–99)
Glucose-Capillary: 176 mg/dL — ABNORMAL HIGH (ref 70–99)
Glucose-Capillary: 186 mg/dL — ABNORMAL HIGH (ref 70–99)
Glucose-Capillary: 188 mg/dL — ABNORMAL HIGH (ref 70–99)
Glucose-Capillary: 190 mg/dL — ABNORMAL HIGH (ref 70–99)
Glucose-Capillary: 198 mg/dL — ABNORMAL HIGH (ref 70–99)
Glucose-Capillary: 200 mg/dL — ABNORMAL HIGH (ref 70–99)
Glucose-Capillary: 239 mg/dL — ABNORMAL HIGH (ref 70–99)
Glucose-Capillary: 241 mg/dL — ABNORMAL HIGH (ref 70–99)
Glucose-Capillary: 244 mg/dL — ABNORMAL HIGH (ref 70–99)
Glucose-Capillary: 247 mg/dL — ABNORMAL HIGH (ref 70–99)
Glucose-Capillary: 262 mg/dL — ABNORMAL HIGH (ref 70–99)
Glucose-Capillary: 262 mg/dL — ABNORMAL HIGH (ref 70–99)
Glucose-Capillary: 278 mg/dL — ABNORMAL HIGH (ref 70–99)
Glucose-Capillary: 355 mg/dL — ABNORMAL HIGH (ref 70–99)
Glucose-Capillary: 356 mg/dL — ABNORMAL HIGH (ref 70–99)
Glucose-Capillary: 357 mg/dL — ABNORMAL HIGH (ref 70–99)
Glucose-Capillary: 374 mg/dL — ABNORMAL HIGH (ref 70–99)
Glucose-Capillary: 418 mg/dL — ABNORMAL HIGH (ref 70–99)

## 2010-11-18 LAB — CARBOXYHEMOGLOBIN
Carboxyhemoglobin: 0.9 % (ref 0.5–1.5)
Methemoglobin: 0.6 % (ref 0.0–1.5)

## 2010-11-18 LAB — COMPREHENSIVE METABOLIC PANEL
ALT: 14 U/L (ref 0–35)
AST: 18 U/L (ref 0–37)
Albumin: 2.8 g/dL — ABNORMAL LOW (ref 3.5–5.2)
BUN: 32 mg/dL — ABNORMAL HIGH (ref 6–23)
CO2: 21 mEq/L (ref 19–32)
Calcium: 8.7 mg/dL (ref 8.4–10.5)
Calcium: 8.8 mg/dL (ref 8.4–10.5)
Calcium: 9.9 mg/dL (ref 8.4–10.5)
Creatinine, Ser: 1.26 mg/dL — ABNORMAL HIGH (ref 0.4–1.2)
Creatinine, Ser: 1.5 mg/dL — ABNORMAL HIGH (ref 0.4–1.2)
Creatinine, Ser: 1.82 mg/dL — ABNORMAL HIGH (ref 0.4–1.2)
GFR calc Af Amer: 40 mL/min — ABNORMAL LOW (ref >60)
GFR calc non Af Amer: 26 mL/min — ABNORMAL LOW (ref >60)
Glucose, Bld: 158 mg/dL — ABNORMAL HIGH (ref 70–99)
Glucose, Bld: 181 mg/dL — ABNORMAL HIGH (ref 70–99)
Sodium: 136 mEq/L (ref 135–145)
Total Protein: 5.1 g/dL — ABNORMAL LOW (ref 6.0–8.3)
Total Protein: 5.7 g/dL — ABNORMAL LOW (ref 6.0–8.3)

## 2010-11-18 LAB — BASIC METABOLIC PANEL
BUN: 34 mg/dL — ABNORMAL HIGH (ref 6–23)
BUN: 35 mg/dL — ABNORMAL HIGH (ref 6–23)
CO2: 21 mEq/L (ref 19–32)
Calcium: 9 mg/dL (ref 8.4–10.5)
Chloride: 109 mEq/L (ref 96–112)
Chloride: 110 mEq/L (ref 96–112)
Creatinine, Ser: 1.36 mg/dL — ABNORMAL HIGH (ref 0.4–1.2)
Creatinine, Ser: 1.55 mg/dL — ABNORMAL HIGH (ref 0.4–1.2)
GFR calc Af Amer: 38 mL/min — ABNORMAL LOW (ref >60)
GFR calc non Af Amer: 37 mL/min — ABNORMAL LOW (ref >60)
Glucose, Bld: 255 mg/dL — ABNORMAL HIGH (ref 70–99)
Potassium: 4.7 mEq/L (ref 3.5–5.1)

## 2010-11-18 LAB — URINE CULTURE: Colony Count: >=100000

## 2010-11-18 LAB — URINALYSIS, ROUTINE W REFLEX MICROSCOPIC
Glucose, UA: NEGATIVE mg/dL
Ketones, ur: NEGATIVE mg/dL
Nitrite: NEGATIVE
Protein, ur: NEGATIVE mg/dL
Specific Gravity, Urine: 1.011 (ref 1.005–1.030)
Specific Gravity, Urine: 1.012 (ref 1.005–1.030)
Urobilinogen, UA: 0.2 mg/dL (ref 0.0–1.0)
pH: 5.5 (ref 5.0–8.0)

## 2010-11-18 LAB — CROSSMATCH: Antibody Screen: NEGATIVE

## 2010-11-18 LAB — MAGNESIUM
Magnesium: 2 mg/dL (ref 1.5–2.5)
Magnesium: 2 mg/dL (ref 1.5–2.5)

## 2010-11-18 LAB — POCT I-STAT 3, VENOUS BLOOD GAS (G3P V)
Acid-base deficit: 3 mmol/L — ABNORMAL HIGH (ref 0.0–2.0)
O2 Saturation: 31 %

## 2010-11-18 LAB — ABO/RH: ABO/RH(D): A NEG

## 2010-11-18 LAB — HEMOGLOBIN A1C: Hgb A1c MFr Bld: 6.6 % — ABNORMAL HIGH (ref <5.7)

## 2010-11-18 LAB — APTT: aPTT: 36 seconds (ref 24–37)

## 2010-11-19 LAB — POCT URINALYSIS DIP (DEVICE)
Bilirubin Urine: NEGATIVE
Ketones, ur: NEGATIVE mg/dL
Protein, ur: NEGATIVE mg/dL
Specific Gravity, Urine: 1.015 (ref 1.005–1.030)
pH: 5.5 (ref 5.0–8.0)

## 2010-11-19 NOTE — Medication Information (Signed)
Summary: rov/ewj  Anticoagulant Therapy  Managed by: Cloyde Reams, RN, BSN Referring MD: Graciela Husbands PCP: Corwin Levins MD Supervising MD: Daleen Squibb MD, Maisie Fus Indication 1: Pulmonary embolus (ICD-415.19) Lab Used: [facility] [place] Site: [address] INR POC 2.5 INR RANGE 2 - 3  Dietary changes: no    Health status changes: no    Bleeding/hemorrhagic complications: no    Recent/future hospitalizations: no    Any changes in medication regimen? no    Recent/future dental: no  Any missed doses?: no       Is patient compliant with meds? yes       Allergies: 1)  ! Valium 2)  ! Cardizem 3)  ! * Deltasone 4)  ! * Tequin 5)  ! * Lyrica 6)  ! * Amiodarone 7)  ! * Duragesic 8)  ! * Spironolactone 9)  Levaquin  Anticoagulation Management History:      The patient is taking warfarin and comes in today for a routine follow up visit.  Positive risk factors for bleeding include an age of 75 years or older and presence of serious comorbidities.  The bleeding index is 'intermediate risk'.  Positive CHADS2 values include History of CHF, History of HTN, Age > 32 years old, and History of Diabetes.  The start date was 03/26/2005.  Her last INR was 5.8 RATIO.  Anticoagulation responsible provider: Daleen Squibb MD, Maisie Fus.  INR POC: 2.5.  Cuvette Lot#: [id number].  Exp: 10/2011.    Anticoagulation Management Assessment/Plan:      The patient's current anticoagulation dose is Warfarin sodium 5 mg tabs: Take as directed by coumadin clinic..  The target INR is 2 - 3.  The next INR is due 12/13/2010.  Anticoagulation instructions were given to patient/daughters.  Results were reviewed/authorized by Cloyde Reams, RN, BSN.  She was notified by Cloyde Reams RN.         Prior Anticoagulation Instructions: INR 3.2  Start taking 1 tablet daily except 1/2 tablet on Tuesdays, Thursdays, and Saturdays.  Recheck in 3 weeks.    Current Anticoagulation Instructions: INR 2.5  Continue on same dosage 1  tablet daily except 1/2 tablet on Tuesdays, Thursdays, and Saturdays.  Recheck in 4 weeks.

## 2010-11-20 LAB — CBC
HCT: 30.5 % — ABNORMAL LOW (ref 36.0–46.0)
HCT: 31.5 % — ABNORMAL LOW (ref 36.0–46.0)
Hemoglobin: 12.2 g/dL (ref 12.0–15.0)
MCHC: 33.6 g/dL (ref 30.0–36.0)
MCHC: 34 g/dL (ref 30.0–36.0)
MCHC: 34.1 g/dL (ref 30.0–36.0)
MCHC: 34.1 g/dL (ref 30.0–36.0)
MCHC: 34.1 g/dL (ref 30.0–36.0)
MCV: 87.3 fL (ref 78.0–100.0)
MCV: 88 fL (ref 78.0–100.0)
MCV: 88.1 fL (ref 78.0–100.0)
MCV: 88.1 fL (ref 78.0–100.0)
MCV: 88.2 fL (ref 78.0–100.0)
Platelets: 140 10*3/uL — ABNORMAL LOW (ref 150–400)
Platelets: 141 10*3/uL — ABNORMAL LOW (ref 150–400)
Platelets: 152 10*3/uL (ref 150–400)
Platelets: 169 10*3/uL (ref 150–400)
Platelets: 174 10*3/uL (ref 150–400)
RBC: 3.47 MIL/uL — ABNORMAL LOW (ref 3.87–5.11)
RBC: 3.58 MIL/uL — ABNORMAL LOW (ref 3.87–5.11)
RBC: 3.63 MIL/uL — ABNORMAL LOW (ref 3.87–5.11)
RBC: 3.95 MIL/uL (ref 3.87–5.11)
RDW: 14.5 % (ref 11.5–15.5)
RDW: 14.6 % (ref 11.5–15.5)
WBC: 4.8 10*3/uL (ref 4.0–10.5)
WBC: 5 10*3/uL (ref 4.0–10.5)
WBC: 5.8 10*3/uL (ref 4.0–10.5)

## 2010-11-20 LAB — DIFFERENTIAL
Basophils Relative: 0 % (ref 0–1)
Basophils Relative: 0 % (ref 0–1)
Eosinophils Absolute: 0.1 10*3/uL (ref 0.0–0.7)
Eosinophils Absolute: 0.1 10*3/uL (ref 0.0–0.7)
Eosinophils Relative: 1 % (ref 0–5)
Eosinophils Relative: 2 % (ref 0–5)
Lymphocytes Relative: 14 % (ref 12–46)
Lymphocytes Relative: 15 % (ref 12–46)
Lymphs Abs: 0.6 10*3/uL — ABNORMAL LOW (ref 0.7–4.0)
Lymphs Abs: 0.6 10*3/uL — ABNORMAL LOW (ref 0.7–4.0)
Lymphs Abs: 0.7 10*3/uL (ref 0.7–4.0)
Lymphs Abs: 0.8 10*3/uL (ref 0.7–4.0)
Monocytes Absolute: 0.4 10*3/uL (ref 0.1–1.0)
Monocytes Absolute: 0.5 10*3/uL (ref 0.1–1.0)
Monocytes Relative: 10 % (ref 3–12)
Monocytes Relative: 13 % — ABNORMAL HIGH (ref 3–12)
Monocytes Relative: 6 % (ref 3–12)
Neutro Abs: 4.1 10*3/uL (ref 1.7–7.7)
Neutrophils Relative %: 71 % (ref 43–77)
Neutrophils Relative %: 84 % — ABNORMAL HIGH (ref 43–77)

## 2010-11-20 LAB — LIPID PANEL
Cholesterol: 122 mg/dL (ref 0–200)
HDL: 43 mg/dL (ref >39)
Triglycerides: 86 mg/dL (ref <150)

## 2010-11-20 LAB — GLUCOSE, CAPILLARY
Glucose-Capillary: 170 mg/dL — ABNORMAL HIGH (ref 70–99)
Glucose-Capillary: 191 mg/dL — ABNORMAL HIGH (ref 70–99)
Glucose-Capillary: 231 mg/dL — ABNORMAL HIGH (ref 70–99)
Glucose-Capillary: 409 mg/dL — ABNORMAL HIGH (ref 70–99)

## 2010-11-20 LAB — COMPREHENSIVE METABOLIC PANEL
ALT: 16 U/L (ref 0–35)
AST: 24 U/L (ref 0–37)
AST: 38 U/L — ABNORMAL HIGH (ref 0–37)
AST: 51 U/L — ABNORMAL HIGH (ref 0–37)
Albumin: 3.3 g/dL — ABNORMAL LOW (ref 3.5–5.2)
Albumin: 3.9 g/dL (ref 3.5–5.2)
Alkaline Phosphatase: 52 U/L (ref 39–117)
CO2: 24 mEq/L (ref 19–32)
Calcium: 9 mg/dL (ref 8.4–10.5)
Calcium: 9.4 mg/dL (ref 8.4–10.5)
Calcium: 9.7 mg/dL (ref 8.4–10.5)
Creatinine, Ser: 1.13 mg/dL (ref 0.4–1.2)
Creatinine, Ser: 1.18 mg/dL (ref 0.4–1.2)
GFR calc Af Amer: 53 mL/min — ABNORMAL LOW (ref >60)
GFR calc Af Amer: 54 mL/min — ABNORMAL LOW (ref >60)
GFR calc Af Amer: 55 mL/min — ABNORMAL LOW (ref >60)
GFR calc non Af Amer: 43 mL/min — ABNORMAL LOW (ref >60)
GFR calc non Af Amer: 46 mL/min — ABNORMAL LOW (ref >60)
Glucose, Bld: 237 mg/dL — ABNORMAL HIGH (ref 70–99)
Potassium: 4.5 mEq/L (ref 3.5–5.1)
Sodium: 135 mEq/L (ref 135–145)
Total Protein: 6.2 g/dL (ref 6.0–8.3)

## 2010-11-20 LAB — URINE CULTURE

## 2010-11-20 LAB — CK TOTAL AND CKMB (NOT AT ARMC)
CK, MB: 19.7 ng/mL (ref 0.3–4.0)
CK, MB: 66.2 ng/mL (ref 0.3–4.0)
Relative Index: INVALID (ref 0.0–2.5)
Total CK: 293 U/L — ABNORMAL HIGH (ref 7–177)
Total CK: 92 U/L (ref 7–177)

## 2010-11-20 LAB — PROTIME-INR
INR: 1.61 — ABNORMAL HIGH (ref 0.00–1.49)
INR: 1.79 — ABNORMAL HIGH (ref 0.00–1.49)
Prothrombin Time: 17.9 seconds — ABNORMAL HIGH (ref 11.6–15.2)
Prothrombin Time: 20.6 seconds — ABNORMAL HIGH (ref 11.6–15.2)
Prothrombin Time: 21.8 seconds — ABNORMAL HIGH (ref 11.6–15.2)

## 2010-11-20 LAB — BASIC METABOLIC PANEL
BUN: 21 mg/dL (ref 6–23)
CO2: 24 mEq/L (ref 19–32)
CO2: 25 mEq/L (ref 19–32)
Calcium: 9 mg/dL (ref 8.4–10.5)
Chloride: 103 mEq/L (ref 96–112)
Chloride: 103 mEq/L (ref 96–112)
Chloride: 105 mEq/L (ref 96–112)
Creatinine, Ser: 1.07 mg/dL (ref 0.4–1.2)
Creatinine, Ser: 1.12 mg/dL (ref 0.4–1.2)
Creatinine, Ser: 1.22 mg/dL — ABNORMAL HIGH (ref 0.4–1.2)
GFR calc Af Amer: 51 mL/min — ABNORMAL LOW (ref >60)
GFR calc Af Amer: 59 mL/min — ABNORMAL LOW (ref >60)
GFR calc non Af Amer: 49 mL/min — ABNORMAL LOW (ref >60)
Potassium: 3.9 mEq/L (ref 3.5–5.1)
Potassium: 4.3 mEq/L (ref 3.5–5.1)

## 2010-11-20 LAB — POCT URINALYSIS DIP (DEVICE)
Glucose, UA: NEGATIVE mg/dL
Ketones, ur: NEGATIVE mg/dL
Specific Gravity, Urine: 1.01 (ref 1.005–1.030)
Urobilinogen, UA: 0.2 mg/dL (ref 0.0–1.0)

## 2010-11-20 LAB — TROPONIN I: Troponin I: 9.34 ng/mL (ref 0.00–0.06)

## 2010-11-20 LAB — PHOSPHORUS: Phosphorus: 2.8 mg/dL (ref 2.3–4.6)

## 2010-11-20 LAB — HEMOGLOBIN A1C: Mean Plasma Glucose: 163 mg/dL

## 2010-11-20 LAB — HEPARIN LEVEL (UNFRACTIONATED)
Heparin Unfractionated: 0.29 IU/mL — ABNORMAL LOW (ref 0.30–0.70)
Heparin Unfractionated: 0.43 IU/mL (ref 0.30–0.70)

## 2010-11-20 LAB — POCT CARDIAC MARKERS
CKMB, poc: 1.1 ng/mL (ref 1.0–8.0)
Troponin i, poc: <0.05 ng/mL (ref 0.00–0.09)

## 2010-11-20 LAB — CARDIAC PANEL(CRET KIN+CKTOT+MB+TROPI)
CK, MB: 54.6 ng/mL (ref 0.3–4.0)
Relative Index: 20.1 — ABNORMAL HIGH (ref 0.0–2.5)
Total CK: 185 U/L — ABNORMAL HIGH (ref 7–177)
Troponin I: 9.01 ng/mL (ref 0.00–0.06)

## 2010-11-20 LAB — MAGNESIUM: Magnesium: 1.8 mg/dL (ref 1.5–2.5)

## 2010-11-24 LAB — LIPID PANEL
LDL Cholesterol: 46 mg/dL (ref 0–99)
Total CHOL/HDL Ratio: 2.9 RATIO
VLDL: 17 mg/dL (ref 0–40)

## 2010-11-24 LAB — BASIC METABOLIC PANEL
BUN: 23 mg/dL (ref 6–23)
CO2: 26 mEq/L (ref 19–32)
CO2: 27 mEq/L (ref 19–32)
Calcium: 9.3 mg/dL (ref 8.4–10.5)
Chloride: 103 mEq/L (ref 96–112)
GFR calc Af Amer: 45 mL/min — ABNORMAL LOW (ref >60)
Glucose, Bld: 135 mg/dL — ABNORMAL HIGH (ref 70–99)
Potassium: 4.4 mEq/L (ref 3.5–5.1)
Sodium: 138 mEq/L (ref 135–145)
Sodium: 139 mEq/L (ref 135–145)

## 2010-11-24 LAB — CARDIAC PANEL(CRET KIN+CKTOT+MB+TROPI)
CK, MB: 1.8 ng/mL (ref 0.3–4.0)
Relative Index: INVALID (ref 0.0–2.5)
Troponin I: 0.02 ng/mL (ref 0.00–0.06)
Troponin I: 0.02 ng/mL (ref 0.00–0.06)

## 2010-11-24 LAB — COMPREHENSIVE METABOLIC PANEL
BUN: 21 mg/dL (ref 6–23)
CO2: 19 mEq/L (ref 19–32)
Calcium: 8.7 mg/dL (ref 8.4–10.5)
Chloride: 105 mEq/L (ref 96–112)
Creatinine, Ser: 1.36 mg/dL — ABNORMAL HIGH (ref 0.4–1.2)
GFR calc non Af Amer: 37 mL/min — ABNORMAL LOW (ref >60)
Glucose, Bld: 199 mg/dL — ABNORMAL HIGH (ref 70–99)
Total Bilirubin: 0.4 mg/dL (ref 0.3–1.2)

## 2010-11-24 LAB — CBC
HCT: 28.1 % — ABNORMAL LOW (ref 36.0–46.0)
HCT: 28.3 % — ABNORMAL LOW (ref 36.0–46.0)
Hemoglobin: 10 g/dL — ABNORMAL LOW (ref 12.0–15.0)
Hemoglobin: 9.3 g/dL — ABNORMAL LOW (ref 12.0–15.0)
Hemoglobin: 9.5 g/dL — ABNORMAL LOW (ref 12.0–15.0)
MCHC: 32.9 g/dL (ref 30.0–36.0)
MCHC: 33.5 g/dL (ref 30.0–36.0)
MCHC: 33.5 g/dL (ref 30.0–36.0)
MCV: 86.6 fL (ref 78.0–100.0)
MCV: 87 fL (ref 78.0–100.0)
Platelets: 203 10*3/uL (ref 150–400)
RBC: 3.23 MIL/uL — ABNORMAL LOW (ref 3.87–5.11)
RBC: 3.27 MIL/uL — ABNORMAL LOW (ref 3.87–5.11)
RDW: 14.2 % (ref 11.5–15.5)
WBC: 3.7 10*3/uL — ABNORMAL LOW (ref 4.0–10.5)
WBC: 4.9 10*3/uL (ref 4.0–10.5)

## 2010-11-24 LAB — APTT: aPTT: 28 seconds (ref 24–37)

## 2010-11-24 LAB — PROTIME-INR
INR: 2.26 — ABNORMAL HIGH (ref 0.00–1.49)
Prothrombin Time: 20.5 seconds — ABNORMAL HIGH (ref 11.6–15.2)
Prothrombin Time: 21.8 seconds — ABNORMAL HIGH (ref 11.6–15.2)

## 2010-11-24 LAB — GLUCOSE, CAPILLARY
Glucose-Capillary: 194 mg/dL — ABNORMAL HIGH (ref 70–99)
Glucose-Capillary: 218 mg/dL — ABNORMAL HIGH (ref 70–99)
Glucose-Capillary: 231 mg/dL — ABNORMAL HIGH (ref 70–99)

## 2010-11-24 LAB — CK TOTAL AND CKMB (NOT AT ARMC)
CK, MB: 2.4 ng/mL (ref 0.3–4.0)
Relative Index: INVALID (ref 0.0–2.5)

## 2010-11-24 LAB — POCT CARDIAC MARKERS: Troponin i, poc: <0.05 ng/mL (ref 0.00–0.09)

## 2010-11-24 LAB — HEMOGLOBIN A1C
Hgb A1c MFr Bld: 7.1 % — ABNORMAL HIGH (ref 4.6–6.1)
Mean Plasma Glucose: 157 mg/dL

## 2010-11-24 LAB — DIFFERENTIAL
Basophils Absolute: 0.1 10*3/uL (ref 0.0–0.1)
Eosinophils Absolute: 0.1 10*3/uL (ref 0.0–0.7)
Lymphocytes Relative: 9 % — ABNORMAL LOW (ref 12–46)
Lymphs Abs: 0.4 10*3/uL — ABNORMAL LOW (ref 0.7–4.0)
Neutrophils Relative %: 79 % — ABNORMAL HIGH (ref 43–77)

## 2010-11-24 LAB — D-DIMER, QUANTITATIVE: D-Dimer, Quant: 0.9 ug/mL-FEU — ABNORMAL HIGH (ref 0.00–0.48)

## 2010-11-25 ENCOUNTER — Ambulatory Visit: Payer: Medicare Other

## 2010-11-29 ENCOUNTER — Encounter: Payer: Self-pay | Admitting: Internal Medicine

## 2010-11-29 ENCOUNTER — Ambulatory Visit (INDEPENDENT_AMBULATORY_CARE_PROVIDER_SITE_OTHER): Payer: Medicare Other | Admitting: Internal Medicine

## 2010-11-29 ENCOUNTER — Other Ambulatory Visit (INDEPENDENT_AMBULATORY_CARE_PROVIDER_SITE_OTHER): Payer: Medicare Other

## 2010-11-29 VITALS — BP 112/58 | HR 75 | Temp 98.6°F | Ht 66.0 in | Wt 138.1 lb

## 2010-11-29 DIAGNOSIS — E785 Hyperlipidemia, unspecified: Secondary | ICD-10-CM

## 2010-11-29 DIAGNOSIS — I10 Essential (primary) hypertension: Secondary | ICD-10-CM

## 2010-11-29 DIAGNOSIS — E538 Deficiency of other specified B group vitamins: Secondary | ICD-10-CM

## 2010-11-29 DIAGNOSIS — K5900 Constipation, unspecified: Secondary | ICD-10-CM

## 2010-11-29 DIAGNOSIS — E119 Type 2 diabetes mellitus without complications: Secondary | ICD-10-CM

## 2010-11-29 DIAGNOSIS — Z23 Encounter for immunization: Secondary | ICD-10-CM

## 2010-11-29 DIAGNOSIS — Z0000 Encounter for general adult medical examination without abnormal findings: Secondary | ICD-10-CM

## 2010-11-29 LAB — BASIC METABOLIC PANEL
CO2: 25 mEq/L (ref 19–32)
Calcium: 9.5 mg/dL (ref 8.4–10.5)
Creatinine, Ser: 1.7 mg/dL — ABNORMAL HIGH (ref 0.4–1.2)
Glucose, Bld: 77 mg/dL (ref 70–99)

## 2010-11-29 LAB — LIPID PANEL
HDL: 39.7 mg/dL (ref >39.00)
Triglycerides: 100 mg/dL (ref 0.0–149.0)
VLDL: 20 mg/dL (ref 0.0–40.0)

## 2010-11-29 LAB — HEMOGLOBIN A1C: Hgb A1c MFr Bld: 6.7 % — ABNORMAL HIGH (ref 4.6–6.5)

## 2010-11-29 MED ORDER — CYANOCOBALAMIN 1000 MCG/ML IJ SOLN
1000.0000 ug | Freq: Once | INTRAMUSCULAR | Status: AC
  Administered 2010-11-29: 1000 ug via INTRAMUSCULAR

## 2010-11-29 MED ORDER — TETANUS-DIPHTH-ACELL PERTUSSIS 5-2.5-18.5 LF-MCG/0.5 IM SUSP
0.5000 mL | Freq: Once | INTRAMUSCULAR | Status: AC
  Administered 2010-11-29: 0.5 mL via INTRAMUSCULAR

## 2010-11-29 MED ORDER — FUROSEMIDE 20 MG PO TABS: 20.0000 mg | ORAL_TABLET | ORAL | Status: DC

## 2010-11-29 MED ORDER — GABAPENTIN 300 MG PO CAPS: 300.0000 mg | ORAL_CAPSULE | Freq: Every day | ORAL | Status: DC

## 2010-11-29 NOTE — Progress Notes (Signed)
Addended by: Oliver Barre on: 11/29/2010 09:45 AM   Modules accepted: Orders

## 2010-11-29 NOTE — Assessment & Plan Note (Signed)
stable overall by hx and exam, most recent lab reviewed with pt, and pt to continue medical treatment as before 

## 2010-11-29 NOTE — Patient Instructions (Addendum)
The current medical regimen is effective;  continue present plan and medications. You can also use OTC senakot or daily prunes for the constipation Please keep your appointments with your specialists as you have planned Please go to LAB in the Basement for the blood and/or urine tests to be done today Please call the number on the Blue Card (the [employer]Tree System) for results of testing in 2-3 days Please return in 6 mo

## 2010-11-29 NOTE — Assessment & Plan Note (Signed)
stable overall by hx and exam, most recent lab reviewed with pt, and pt to continue medical treatment as before  Lab Results  Component Value Date   HGBA1C 6.3 05/30/2010

## 2010-11-29 NOTE — Progress Notes (Signed)
Subjective:    Patient ID: Lindsay Sheppard, female    DOB: 1920-11-24, 77 y.o.   MRN: [id number]  HPI  Here to f/u;  Pt denies chest pain, increased sob or doe, wheezing, orthopnea, PND, increased LE swelling, palpitations, dizziness or syncope. Pt denies new neurological symptoms such as new headache, or facial or extremity weakness or numbness   Pt denies polydipsia, polyuria, or low sugar symptoms such as weakness or confusion improved with po intake.  Pt states overall good compliance with meds, trying to follow lower cholesterol, diabetic diet, wt overall stable but little exercise however.   Stil takes allegra OTC for allergies and does well with this.    Also with constipation despite stool softners, drinks enough fluids it seems, ongong for several yrs, not really worse lately, only taking the lasix 20 mg qod with the blesssing or Dr Kathrene Bongo;  Recent lab nov 2011 stable with cr 1.6;  Not really using the pain medication very often -, also eating prunes off and on; has taken miralax in the past but asked not for unclear now;  Has not tried senakot or other OTC yet.   Has still some PHN of the right face, and asked per renal to decrease the gabapentin to 1 qhs., still has some uncomfortable durig the day, but pt states she put up with it, though family wants her to take bid at least.       Also c/o left lower back pain, radiates towards the left shoulder blade, described as a "hard pain", overall mld to mod, intermittent,  Worse to stand up and move about some days for just over a month, better to sit and be still.  No pain radiating to the legs or numbness or weakness,  No recent falls or other signficant gait change, no fever, no unusual wt loss.    Past Medical History  Diagnosis Date  . POSTHERPETIC NEURALGIA 11/02/2009  . DIABETES MELLITUS, TYPE II 09/06/2007  . B12 DEFICIENCY 05/23/2007  . HYPERLIPIDEMIA 05/23/2007  . HYPERKALEMIA 05/30/2010  . ANEMIA-IRON DEFICIENCY 01/26/2007  .  DEPRESSIVE DISORDER 01/25/2008  . BLEPHARITIS, BILATERAL 02/23/2008  . Other specified forms of hearing loss 05/30/2010  . HYPERTENSION 01/26/2007  . Atrial fibrillation 05/23/2007  . DIASTOLIC HEART FAILURE, CHRONIC 12/14/2009  . POSTURAL HYPOTENSION 02/07/2008  . ALLERGIC RHINITIS 11/02/2009  . GERD 01/26/2007  . CONSTIPATION 01/25/2008  . RENAL INSUFFICIENCY 07/02/2009  . RENAL CYST, LEFT 09/06/2007  . SKIN LESION 05/30/2010  . BACK PAIN 08/02/2007  . OSTEOPOROSIS 05/23/2007  . SYNCOPE 02/07/2008  . FATIGUE 09/06/2007  . Dysuria 09/05/2008  . Abdominal pain, epigastric 09/06/2007  . EPIGASTRIC TENDERNESS 10/12/2007  . PULMONARY EMBOLISM, HX OF 05/23/2007  . SHINGLES, HX OF 05/23/2007  . Pulmonary embolism 10/14/2010   Past Surgical History  Procedure Date  . Appendectomy   . Abdominal hysterectomy     reports that she has never smoked. She does not have any smokeless tobacco history on file. She reports that she does not drink alcohol or use illicit drugs. family history includes Breast cancer in her mother; Colon cancer in her father; Diabetes in her sister; Hypertension in her other; and Stroke in her father. Allergies  Allergen Reactions  . Amiodarone     REACTION: f? fluid retention  . Diazepam     REACTION: agitation  . Diltiazem Hcl   . Levofloxacin     REACTION: nausea  . Pregabalin     REACTION: hallucinations  . Spironolactone  REACTION: elevated K at lowest dose    Review of Systems Review of Systems  Constitutional: Negative for diaphoresis and unexpected weight change.  HENT: Negative for drooling and tinnitus.   Eyes: Negative for photophobia and visual disturbance.  Respiratory: Negative for choking and stridor.   Gastrointestinal: Negative for vomiting and blood in stool.  Genitourinary: Negative for hematuria and decreased urine volume.  Musculoskeletal: Negative for gait problem.  Skin: Negative for color change and wound.  Neurological: Negative for tremors and  numbness.  Psychiatric/Behavioral: Negative for decreased concentration. The patient is not hyperactive.       Objective:   Physical Exam BP 112/58  Pulse 75  Temp(Src) 98.6 F (37 C) (Oral)  Ht 5\' 6"  (1.676 m)  Wt 138 lb 2 oz (62.653 kg)  BMI 22.29 kg/m2  SpO2 98%  Physical Exam  VS noted Constitutional: Pt appears well-developed and well-nourished.  HENT: Head: Normocephalic.  Right Ear: External ear normal.  Left Ear: External ear normal.  Eyes: Conjunctivae and EOM are normal. Pupils are equal, round, and reactive to light.  Neck: Normal range of motion. Neck supple.  Cardiovascular: Normal rate and regular rhythm.   Pulmonary/Chest: Effort normal and breath sounds normal.  Abd:  Soft, NT, non-distended, + BS Neurological: Pt is alert. No cranial nerve deficit.  Skin: Skin is warm. No erythema.  Psychiatric: Pt behavior is normal. Thought content normal.         Assessment & Plan:

## 2010-11-29 NOTE — Assessment & Plan Note (Signed)
stable overall by hx and exam, most recent lab reviewed with pt, and pt to continue medical treatment as before  Lab Results  Component Value Date   LDLCALC 100* 05/30/2010

## 2010-12-10 LAB — POCT URINALYSIS DIP (DEVICE)
Nitrite: NEGATIVE
Protein, ur: NEGATIVE mg/dL
Urobilinogen, UA: 0.2 mg/dL (ref 0.0–1.0)
pH: 7 (ref 5.0–8.0)

## 2010-12-10 LAB — POCT I-STAT, CHEM 8
BUN: 27 mg/dL — ABNORMAL HIGH (ref 6–23)
Chloride: 105 mEq/L (ref 96–112)
Creatinine, Ser: 1.4 mg/dL — ABNORMAL HIGH (ref 0.4–1.2)
Glucose, Bld: 116 mg/dL — ABNORMAL HIGH (ref 70–99)
HCT: 40 % (ref 36.0–46.0)
Potassium: 4.9 mEq/L (ref 3.5–5.1)

## 2010-12-13 ENCOUNTER — Ambulatory Visit (INDEPENDENT_AMBULATORY_CARE_PROVIDER_SITE_OTHER): Payer: Medicare Other | Admitting: *Deleted

## 2010-12-13 DIAGNOSIS — Z86718 Personal history of other venous thrombosis and embolism: Secondary | ICD-10-CM

## 2010-12-13 DIAGNOSIS — I4891 Unspecified atrial fibrillation: Secondary | ICD-10-CM

## 2010-12-13 DIAGNOSIS — I2699 Other pulmonary embolism without acute cor pulmonale: Secondary | ICD-10-CM

## 2010-12-23 ENCOUNTER — Other Ambulatory Visit (INDEPENDENT_AMBULATORY_CARE_PROVIDER_SITE_OTHER): Payer: Medicare Other

## 2010-12-23 ENCOUNTER — Encounter: Payer: Self-pay | Admitting: Internal Medicine

## 2010-12-23 ENCOUNTER — Other Ambulatory Visit: Payer: Self-pay | Admitting: Internal Medicine

## 2010-12-23 DIAGNOSIS — H47029 Hemorrhage in optic nerve sheath, unspecified eye: Secondary | ICD-10-CM | POA: Insufficient documentation

## 2010-12-23 HISTORY — DX: Hemorrhage in optic nerve sheath, unspecified eye: H47.029

## 2010-12-23 NOTE — Progress Notes (Signed)
Quick Note:  Voice message left on [employer] system - lab is negative, normal or otherwise stable, pt to continue same tx ______ 

## 2010-12-24 LAB — C-REACTIVE PROTEIN: CRP: 0.1 mg/dL (ref <0.6)

## 2010-12-24 NOTE — Progress Notes (Signed)
Quick Note:  Voice message left on [employer] system - lab is negative, normal or otherwise stable, pt to continue same tx ______ 

## 2010-12-31 ENCOUNTER — Ambulatory Visit (INDEPENDENT_AMBULATORY_CARE_PROVIDER_SITE_OTHER): Payer: Medicare Other | Admitting: *Deleted

## 2010-12-31 ENCOUNTER — Ambulatory Visit (INDEPENDENT_AMBULATORY_CARE_PROVIDER_SITE_OTHER): Payer: Medicare Other

## 2010-12-31 DIAGNOSIS — Z86718 Personal history of other venous thrombosis and embolism: Secondary | ICD-10-CM

## 2010-12-31 DIAGNOSIS — I4891 Unspecified atrial fibrillation: Secondary | ICD-10-CM

## 2010-12-31 DIAGNOSIS — I2699 Other pulmonary embolism without acute cor pulmonale: Secondary | ICD-10-CM

## 2010-12-31 DIAGNOSIS — E538 Deficiency of other specified B group vitamins: Secondary | ICD-10-CM

## 2010-12-31 LAB — POCT INR: INR: 3.2

## 2010-12-31 MED ORDER — CYANOCOBALAMIN 1000 MCG/ML IJ SOLN
1000.0000 ug | Freq: Once | INTRAMUSCULAR | Status: AC
  Administered 2010-12-31: 1000 ug via INTRAMUSCULAR

## 2011-01-14 NOTE — Letter (Signed)
July 16, 2007    Corwin Levins, MD  [address]  [place], [place] [address]   RE:  AKANE, TESSIER  MRN:  [id number]  /  DOB:  31-Dec-1920   Dear Rosanne Ashing,   It was a pleasure to see Lindsay Sheppard at the request of you and your  inpatient service.  She has a history of atrial fibrillation which is  currently well controlled with only a few palpitations.  She is on a  rate-controlling strategy using Zybeta.  She also takes Coumadin, both  for atrial fibrillation as well as her history of pulmonary emboli.  She  has very difficult to control hypertension, and she takes legion  medications for this, including Avapro 300, Catapres 0.3 b.i.d., Lasix  40, and Plental 10.  She also takes statins and antidiabetic  medications, and she would like to come off of her medications, but she  understands the recommendations.   PHYSICAL EXAMINATION:  Her blood pressure today is borderline at 152/70.  Her pulse is 59.  LUNGS:  Clear.  Her heart sounds were regular.  EXTREMITIES:  Without edema.   Her laboratories were reviewed from August, and they were not  particularly notable, except for an HDL of 34, which I am not sure that  I would pursue at this point.   IMPRESSION:  1. Atrial fibrillation.  2. Thromboembolic risk factors notable for:      a.     Diabetes.      b.     Hypertension.  3. History of pulmonary emboli.  4. History of Wolff-Parkinson-White ablation years and years ago.   Ms. Renstrom is stable from an arrhythmia point of view.  I think there is  nothing active to which I can add, as I think she is on excellent  medication and is receiving excellent care.  If there is anything  further I can do, please do not hesitate to contact me.  We will  otherwise plan to see her only at your request.    Sincerely,      Duke Salvia, MD, [facility]  Electronically Signed    SCK/MedQ  DD: 07/16/2007  DT: 07/16/2007  Job #: [id number]

## 2011-01-14 NOTE — Discharge Summary (Signed)
NAMEMARYFER, TAUZIN                ACCOUNT NO.:  [id number]   MEDICAL RECORD NO.:  [id number]          PATIENT TYPE:  INP   LOCATION:  5532                         FACILITY:  [facility]   PHYSICIAN:  Valerie A. Felicity Coyer, MDDATE OF BIRTH:  12/22/1920   DATE OF ADMISSION:  01/27/2008  DATE OF DISCHARGE:  02/01/2008                               DISCHARGE SUMMARY   DIAGNOSES AT THE OF DISCHARGE:  1. Uncontrolled hypertension.  2  Left renal mass being followed by serial imaging as an outpatient.  3  Diabetes type 2 with hypoglycemia.  1. Klebsiella urinary tract infection currently on day #6 out of 10      total days planned antibiotics.  2. 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  Plan to continue on Coumadin.  3. Weakness/failure to thrive felt to be multifactorial secondary to      uncontrolled hypertension and urinary tract infection, as well as      underlying depression.  4. Gastroesophageal reflux disease  5. B12 deficiency with stable B12 level during this admission.  6. History of pulmonary embolism on chronic Coumadin.  7. History of paroxysmal atrial fibrillation on chronic Coumadin.   HISTORY OF PRESENT ILLNESS:  Ms. Un is an 77 year old white female  who was admitted on Jan 27, 2008, with a chief complaint of weakness and  shaking.  She was brought to the emergency room by her daughter due to  elevated blood pressure of 221/114 on the morning of admission.  She  also noted positive loss of appetite, weakness and shakiness, as well as  depression.  She noted a 9-pound weight loss for the last several  months.  She also noted a positive headache.  She complained of  intermittent sensation of hot and cold.  She saw Dr. Jonny Ruiz on Jan 25, 2008, and was started on sertraline at that time.  She also complained  of constipation and was started on MiraLax.  She was admitted for  further evaluation and treatment.   PAST MEDICAL HISTORY:  1.  Aspiration pneumonia.  2. Postherpetic neuralgia.  3. Hypernatremia.  4. Hypokalemia.  5. Paroxysmal atrial fibrillation on chronic anticoagulation.  6. Iron deficiency anemia.  7. Hypertension.  8. Diabetes type 2.  9. Depression.  10.History of PE July 2006 chronically anticoagulated.  11.History of cardiac arrhythmia with ablation approximately 12 years      ago.  12.GERD with stricture.  13.History of B12 deficiency.  14.Hyperlipidemia.  15.Osteoporosis.   COURSE OF HOSPITALIZATION:  1. Uncontrolled hypertension.  The patient was admitted.  She had not      taken her blood pressure medications the morning of admission which      contributed to her elevated blood pressure.  Her home medications      were resumed.  She was ultimately changed to labetalol from      bisoprolol and an ACE inhibitor was added.  Blood pressure at the      time of discharge is 157/86.  She had some complaints of dizziness  upon standing and orthostatic blood pressures were checked which      were unremarkable.  She was instructed to stand slowly and avoid      sudden movement.  2. Left renal mass.  The patient did have a CT of the abdomen and      pelvis during this admission to rule out underlying malignancy.  It      did note a chronic DVT in the left hemipelvis and note a stable      left renal lesion which was suspicious for renal cell carcinoma is      compared to prior MRI.  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      The patient states she is followed by Dr. Patsi Sears for this      lesion and recommended continued outpatient monitoring.  Defer      further management to Dr. Patsi Sears and Dr. Jonny Ruiz.  3. Diabetes type 2.  The patient did have episode of hypoglycemia      during this admission.  Her Lantus was decreased from 12 to 5      units.  We will continue 5 units at the time of discharge until      tolerating diet as prior to admission.  4. Klebsiella urinary tract infection.   The patient was treated      initially with IV Rocephin and was later changed to oral Ceftin.      We will continue to complete a total of a 10-day course.  She is      then instructed to return to her daily Macrobid prophylaxis dosing      upon completion of Ceftin.  5. Chronic DVT of the left hemipelvis.  The patient does have a known      history of PE, suspect left lower extremity DVT.  However, that      would not change management.  We will continue chronic      anticoagulation with Coumadin.  6. Depression.  The patient is continued on sertraline, continue same.      Depression appears stable.  The patient was seen by physical      therapy during this admission and it was felt that she had no      skilled therapy needed upon discharge   MEDICATIONS AT THE TIME OF DISCHARGE:  1. Darvocet-N 100 1 tablet p.o. q.4 h. as needed.  2. Clonidine 0.3 mg p.o. b.i.d.  3. Ferrous sulfate 325 mg p.o. b.i.d.  4. Lasix 40 mg p.o. b.i.d.  5. Amaryl 4 mg p.o. b.i.d.  6. Coumadin 5 mg p.o. daily.  7. Protonix 40 mg p.o. daily.  8. MiraLax 17 g p.o. b.i.d.  9. Felodipine 10 mg p.o. daily.  10.Pravachol 40 mg p.o. daily.  11.Sertraline 50 mg p.o. daily.  12.Ceftin 250 mg p.o. b.i.d. x5 days.  13.Metformin 500 mg p.o. b.i.d.  14.Labetalol 400 mg p.o. b.i.d.  15.Lisinopril 20 mg p.o. b.i.d.  16.Lantus insulin 5 units subcu daily until tolerating diet as before      and then return to 12 units subcu daily.  17.The patient is instructed to discontinue Micardis and bisoprolol.   PERTINENT LABORATORIES AT THE TIME OF DISCHARGE:  BUN 14, creatinine  1.06.   FOLLOW UP:  The patient is to follow up with Dr. Oliver Barre next week in  the office and with Dr. Patsi Sears as scheduled.  She will need follow-  up of ESR which is currently pending at the  time of this dictation at  her appointment.  In addition, [facility] is in the process of  scheduling an outpatient Myoview.   Greater than 30  minutes was spent on discharge planning.      Sandford Craze, NP      Raenette Rover. Felicity Coyer, MD  Electronically Signed    MO/MEDQ  D:  02/01/2008  T:  02/02/2008  Job:  [id number]   cc:   Corwin Levins, MD  Sigmund I. Patsi Sears, M.D.  Duke Salvia, MD, [facility]

## 2011-01-14 NOTE — H&P (Signed)
Lindsay Sheppard, Lindsay Sheppard                ACCOUNT NO.:  [id number]   MEDICAL RECORD NO.:  [id number]          PATIENT TYPE:  INP   LOCATION:  5501                         FACILITY:  [facility]   PHYSICIAN:  Lindsay Sheppard, MDDATE OF BIRTH:  1921-07-02   DATE OF ADMISSION:  01/27/2008  DATE OF DISCHARGE:                              HISTORY & PHYSICAL   PRIMARY CARE PHYSICIAN:  Lindsay Levins, MD and GU is Lindsay I.  Patsi Sheppard, M.D.   CHIEF COMPLAINT:  Weakness and tremors, blood pressure medicine not  working.   HISTORY OF PRESENT ILLNESS:  Ms. Lindsay Sheppard is an 77 year old white woman  with multiple chronic medical issues, who presents to the [facility]  Emergency Room with an elevated blood pressure 221/114 this morning,  when checked by daughter at home.  She reports recent change in the last  weeks from Avapro to Micardis and says she has not felt well since that  time.  She is complaining of loss of appetite, positive for weakness,  positive shakiness, positive depression with a 9-pound weight loss.  She  is also complaining of headache today with intermittent hot and cold  feelings because of this multitude symptoms.  She has been seen both in  the emergency room last week as well as by her primary MD, Dr. Jonny Sheppard Jan 25, 2008, and started on Zoloft 2 days ago.  She also complains of  constipation and was recommended to add MiraLax daily to her stool  softeners, and then still had no bowel movement yesterday or today  despite 2 doses of MiraLax in the last 2 days.   PAST MEDICAL HISTORY:  Significant for:  1. Aspiration pneumonia with hospitalization in 2006.  2. Postherpetic neuralgia with hospitalization in 2006.  3. Paroxysmal AFib on chronic anticoagulation.  4. Iron deficiency anemia.  5. Hypertension.  6. Type 2 diabetes.  7. Depression.  8. Pulmonary embolism in July 2006.  9. Cardiac arrhythmia status post ablation 12 years ago.  10.GERD with esophageal stricture.  11.B12  deficiency.  12.Hyperlipidemia.  13.Osteoporosis.   PAST SURGICAL HISTORY:  Significant for cataracts, carpal tunnel  release, total abdominal hysterectomy, and appendectomy.   SOCIAL HISTORY:  She never smoked.  She is widowed and lives with her  daughter.  No alcohol use.   FAMILY HISTORY:  Brother died of esophageal perforation and father died  of colon cancer with the following stroke.   CURRENT ALLERGIES:  INCLUDE:  1. VALIUM.  2. CARDIZEM.  3. DELTASONE.  4. TEQUIN.  5. LYRICA.  6. AMIODARONE.  7. DURAGESIC.  8. LEVAQUIN.   MEDICATIONS:  1. Micardis 80 mg daily.  2. Bisoprolol 5 mg daily.  3. Clonidine 0.3 mg b.i.d.  4. Iron sulfate 325 mg b.i.d.  5. Fosamax 70 mg once weekly.  6. Furosemide 40 mg b.i.d.  7. MiraLax 17 g p.o. daily.  8. Amaryl 4 mg p.o. b.i.d.  9. Lantus 12 units subcu daily.  10.Metformin 500 mg b.i.d.  11.Protonix 40 mg daily.  12.Plendil 2 mg daily.  13.Pravachol 40 mg daily.  14.Stool softener b.i.d.  15.Coumadin 5 mg as directed.  16.Darvocet q.i.d. p.r.n.  17.Macrobid 100 mg daily.  18.Lactulose 360 mL b.i.d. p.r.n.  19.Zoloft 50 mg daily.   REVIEW OF SYSTEMS:  CONSTITUTIONAL:  No weight gain or weight loss as  described in HPI.  No travel history.  No fevers or positive chills.  CARDIAC:  Denies chest pain, but complains of intermittent chest  tightness earlier today, which has since resolved.  RESPIRATION:  History of PE, but no current shortness of breath.  No cough.  No cold  symptoms.  GENITOURINARY:  No dysuria or hematuria, but positive  frequency and polyuria.  GI:  No vomiting.  No nausea, but positive  constipation.  No diarrhea, chronic dark stools.  No abdominal pain.  MUSCULOSKELETAL:  Chronic pain and leg pain.  SKIN:  No rashes or  ulcers.  NEUROLOGIC:  No muscle weakness, visual changes, difficulty  speaking.  Chronically hard of hearing at baseline.  Remaining systems  reviewed negative except as listed.   PHYSICAL  EXAMINATION:  VITAL SIGNS:  Temperature 97.7, blood pressure  200/91, pulse 83, respirations 20, and satting 97% on room air.  GENERAL:  She is an elderly white female who is weak-appearing, and in  no acute distress.  HEENT:  She is hard of hearing, but normocephalic and atraumatic.  PERRL, EOMI.  Oropharynx is clear without lesions.  Mucous membranes are  moist.  No dental lesions appreciated.  NECK:  Supple.  No masses or thyromegaly.  LYMPHATICS:  Show no axillary or cervical lymphadenopathy.  CARDIOVASCULAR:  Regular rate and rhythm at this time.  Normal S1 and  S2.  No appreciable murmur, rub, or gallop.  EXTREMITIES:  No JVD and no edema in bilateral lower extremities, but  trace petechia.  RESPIRATORY STATUS:  Clear to auscultation bilaterally without wheeze,  crackle, or rhonchi.  No labored or increased work of breathing noted.  ABDOMEN:  Soft, nontender, and nondistended.  Good bowel sounds.  No  appreciable hepatosplenomegaly.  No masses.  BREAST:  Shows bilaterally normal changes.  No masses, skin changes, or  discharges.  PSYCHIATRIC:  He is awake, alert, and oriented x4 with a somewhat flat  affect, but answers questions appropriately.  NEUROLOGIC:  She moves all extremities spontaneously with good motion.  Her speech is clear.  She has facial asymmetry and no gross sensory or  cognitive deficits.   LABORATORY DATA:  Reviewed with a normal basic metabolic, normal CBC,  urinalysis positive for many bacteria, but only 3-6 white cells.  No red  blood cells.   ASSESSMENT AND PLAN:  1. Uncontrolled hypertension.  This apparently has been an ongoing      problem acutely exacerbated with recent med changes, though the      patient did not take her a.m. medications this morning and will      plan to get her home medications now, especially in the setting of      possible clonidine withdrawal.  We will also add p.r.n. clonidine,      now she is status post 2 doses of IV  labetalol by the EDP with      little change in her pressure.  She does not appear at this time to      have any acute end-organ dysfunction related to her uncontrolled      hypertension until she is taken for telemetry monitoring.  We will      check her cardiac enzymes and check an EKG at  this time.  Also      check chest x-ray and no clinical symptoms or exam findings      suggested with pulmonary edema, see orders for details.  2. Positive UA consistent with urinary tract infection.  We will send      for urine culture and add IV Rocephin known she is afebrile with a      normal white count, but carries chronic urologic problems on daily      Macrobid as prescribed by her urologist, Dr. Patsi Sheppard.  3. Weakness, failure to thrive with tremors.  Question of related to      the above issues or some other abnormality.  At this time, plan PT      and OT evaluation.  Chest x-ray as listed above.  We will also rule      out underlying pneumonia.  We will check TSH, especially with her      tremor to rule out hypo or hyperthyroid disease.  Check cardiac      enzymes, rule out anginal equivalent and monitor until they rule      out arrhythmia.  Check B12 level, given history of deficiency and      await further information regarding abnormality.  4. Type 2 diabetes, insulin dependent.  We will continue home Lantus,      Amaryl, and metformin.  She has normal good creatinine function.      Check A1c and add sliding scale insulin coverage while inpatient.  5. Gastroesophageal reflux disease.  Continue home PPI.  6. History of B12 deficiency.  Check B12 level monitor.  7. Dyslipidemia.  Continue statin as prior to admission.  8. Depression.  Newly begun on selective serotonin reuptake inhibitor.      We will continue sertraline and monitor.  9. History of pulmonary embolism in 2006.  Continue Coumadin as prior      to admission, lipid check, INR, and management per pharmacy      protocol.    PLAN:  1. Lovenox or heparin prophylaxis if her INR is less than 2.  2. History of paroxysmal atrial fibrillation.  Normal sinus rhythm at      this time, rate stable.  Continue home beta blocker as well as      anticoagulation as above.  The patient has been seen by both nurse      practitioner Daryel Gerald and myself Dr. Rene Paci.      Please see orders for further details.      Lindsay A. Felicity Coyer, MD  Electronically Signed    VAL/MEDQ  D:  01/27/2008  T:  01/28/2008  Job:  [id number]

## 2011-01-14 NOTE — H&P (Signed)
NAMEJADENCE, KINLAW                ACCOUNT NO.:  [id number]   MEDICAL RECORD NO.:  [id number]          PATIENT TYPE:  INP   LOCATION:  5006                         FACILITY:  [facility]   PHYSICIAN:  Valerie A. Felicity Coyer, MDDATE OF BIRTH:  02-Apr-1921   DATE OF ADMISSION:  03/13/2008  DATE OF DISCHARGE:                              HISTORY & PHYSICAL   PRIMARY CARE PHYSICIAN:  Corwin Levins, MD   UROLOGIST:  Sigmund I. Patsi Sears, M.D.   CHIEF COMPLAINT:  Burning from the inside times month worse than  usual, increased blood pressure, and weakness.   HISTORY OF PRESENT ILLNESS:  Ms. Angelini is an 77 year old white female  with multiple medical problems and recent admission at the end of May  2009 for uncontrolled hypertension and failure to thrive.  Family  brought the patient into the [facility] ED on the day of admission due  to increased blood pressure this morning checked at home and progressive  weakness over the past week.  Upon discussion with the patient, the  patient reports burning all over feeling like its on the inside.  The patient reports symptoms are for several symptoms; however, worse  over the last few weeks.  Daughter reports the patient ambulatory with  walker at home and starting to get stronger from hospitalization in May  2009 with discharge on February 01, 2008.  No recent fever.  No dysuria.  No  recent falls or headaches.  Family members were at bedside upon initial  evaluation.   PAST MEDICAL HISTORY:  1. Uncontrolled hypertension.  2. Type 2 diabetes.  3. Chronic DVT and left hemipelvis.  4. Chronic anticoagulation secondary to problem chronic DVT and left      hemipelvis and history of PE.  5. Left renal mass and left adrenal mass, MRI done August 2008      consistent with renal cell carcinoma.  Left adrenal mass seemingly      myelolipoma.  6. GERD with stricture.  7. History of vitamin B12 deficiency.  8. History of PE.  9. History of paroxysmal AFib.  10.History of aspiration pneumonia.  11.Failure to thrive with admission on June 2009.  12.History of iron deficiency anemia.  13.Depression.  14.Hyperlipidemia.  15.Osteoporosis.  16.Previous cardiac ablation.   ALLERGIES:  VALIUM, DILTIAZEM, CARDIZEM, TEQUIN causes hypoglycemia,  LEVAQUIN causes hypoglycemia, LYRICA and AMIODARONE.   MEDICATIONS:  1. Bisoprolol 5 mg p.o. daily.  2. Clonidine 0.3 mg p.o. b.i.d.  3. Iron sulfate 325 mg p.o. b.i.d.  4. Fosamax 70 mg p.o. weekly.  5. Lasix 40 mg p.o. daily.  6. Glimepiride 4 mg p.o. daily.  7. Lantus 12 units subcu daily.  8. Metformin 500 mg p.o. b.i.d.  9. Protonix 40 mg p.o. daily.  10.Plendil 10 mg p.o. daily.  11.Pravachol 40 mg p.o. daily.  12.Coumadin as directed.  13.Darvocet-N 100 q.i.d. p.r.n.  14.Labetalol 200 mg b.i.d.  15.Zoloft 50 mg p.o. daily.   FAMILY HISTORY:  Reviewed and noncontributory to this admit.   SOCIAL HISTORY:  Son lives with the patient.  The patient with 2  daughters  who live on the same property and are with the patient daily.  The patient denies any tobacco use or EtOH.   REVIEW OF SYSTEMS:  GENERAL:  Positive burning all over.  Negative  chills.  Negative fever.  Positive weakness.  EYES:  Negative blurred vision.  Negative drainage.  EARS, NOSE, AND THROAT:  Negative sore throat.  NECK:  Negative stiffness.  Negative swelling.  CARDIOVASCULAR:  Negative chest pain.  Negative palpitations.  Negative  shortness of breath.  RESPIRATORY:  Negative shortness of breath.  Negative cough.  GI:  Negative nausea, vomiting, or diarrhea.  Negative decreased p.o.  intake.  Negative abdominal pain.  GU:  Negative dysuria.  Negative hematuria.  MUSCULOSKELETAL:  Negative joint pain.  Negative joint swelling.  SKIN:  Negative rashes or lesions.  ENDOCRINE:  Negative polyuria, polydipsia, or polyphagia.  NEURO:  Negative headaches or dizziness.  PSYCH:  Negative depression or anxiety.   PHYSICAL  EXAMINATION:  VITAL SIGNS:  Blood pressure max 212/105 and at  time of exam 190/110, heart rate 100, respirations 18, temperature 98.7,  and O2 sat is 100% on room air.  GENERAL:  This is a clinically weak-appearing white female, alert and  awake in no acute distress.  HEENT:  Head is normocephalic and atraumatic.  Eyes:  Pupils equal,  round, reactive to light.  No scleral icterus or injection.  Ears, nose,  and throat:  Mucous membranes are moist with normal hearing.  NECK:  Supple with no thyromegaly or lymphadenopathy.  CHEST:  Nontender with symmetrical movement.  CARDIOVASCULAR:  S1 and S2.  Regular rate and rhythm.  No lower  extremity edema.  RESPIRATORY:  Lung sounds are clear to auscultation bilaterally.  No  wheezes, rales, or crackles.  No increased work of breathing.  GI:  Abdomen is soft, nontender, and nondistended with positive bowel  sounds.  No masses or hepatosplenomegaly appreciated.  NEURO:  Cranial nerves II through XII intact with no focal, motor, or  sensory deficits on exam.  PSYCH:  The patient is alert and oriented x4 with flat affect and  anxious mood.   LAB AND X-RAY:  At the time of admission, white cell count 5.4, platelet  count 209, hemoglobin 15.4, hematocrit 45.3, sodium 136, potassium 5.8,  chloride 108, CO2 24, BUN 19, creatinine 0.9, INR 1.6, and absolute  neutrophil count 4.3.  Urinalysis positive for nitrite, trace hemoglobin  with 3-6 wbc's, and many bacteria.  Chest x-ray done in the emergency  room reveals chronic scarring with no acute findings.   IMPRESSION AND PLAN:  1. Urinary tract infection with weakness.  We will send urine for      culture and treat empirically with intravenous Rocephin.  Check the      patient's TSH.  She will also have physical therapy and      occupational therapy evaluation.  During this hospitalization, the      patient likely with element of failure to thrive.  2. Hypertensive urgency.  We will continue home  medications with as      needed labetalol.  There is a question whether or not the patient's      home medications, is taking home medications per medical record, as      there is some difference between these 2 lists and daughter unsure.  3. Flushing.  Again, we will check the patient's TSH question whether      or not, this problem is related to the left renal  mass, myelolipoma      by MRI and CT, doubt pheochromocytoma; however, given the patient's      symptoms of hypertension, flushing, and weakness, we will collect      24-hour urine for metanephrines and catecholamines to rule out      pheochromocytoma.  4. Type 2 diabetes.  Continue home meds with sliding insulin coverage      as inpatient check A1c.  5. Hyperkalemia.  Question whether or not discussed with hemolyzed as      noted in the emergency department records.  We will recheck on      admission.  6. Left renal mass with left adrenal mass.  See problem #3.  7. Atrial fibrillation on chronic anticoagulation therapy with      subtherapeutic INR.  Continue home medications.  The patient      currently in sinus rhythm on Coumadin therapy per pharmacy.  Of      note, the patient is not an ideal Coumadin candidate secondary to      advanced stage and increased fall risk; however, we will defer      decision making to the patient's primary care physician at hospital      followup.  8. History of anemia of chronic disease would except decrease in      hemoglobin with IV fluids.  The patient with no acute phleboliths      on exam, we will continue supplemental iron.  9. Gastroesophageal reflux disease.  Continue proton pump inhibitor.  10.Other problems as listed on the past medical history.  The      patient's admission, impression and plan, discussed with Dr.      Rene Paci.  Again, we will have physical therapy and      occupational therapy evaluate of the patient during this hospital      stay.  The patient with very  strong family support at home,      therefore we will unlikely need a skilled nursing facility      placement at discharge.      Cordelia Pen, NP      Raenette Rover. Felicity Coyer, MD  Electronically Signed    LE/MEDQ  D:  03/13/2008  T:  03/14/2008  Job:  [id number]   cc:   Corwin Levins, MD  Sigmund I. Patsi Sears, M.D.

## 2011-01-14 NOTE — Consult Note (Signed)
NAMECYNDE, MENARD                ACCOUNT NO.:  [id number]   MEDICAL RECORD NO.:  [id number]          PATIENT TYPE:  INP   LOCATION:  5501                         FACILITY:  [facility]   PHYSICIAN:  Noralyn Pick. Eden Emms, MD, FACCDATE OF BIRTH:  October 07, 1920   DATE OF CONSULTATION:  DATE OF DISCHARGE:                                 CONSULTATION   Lindsay Sheppard is an 77 year old patient of Dr. Graciela Husbands and Dr. Jonny Ruiz.  She was  admitted to the hospital on Jan 27, 2008, for constitutional symptoms.  The patient had not been feeling well for a week or so.  She lives by  herself.  She recently had Micardis substituted for Avapro and felt that  the medicine was inadequately controlling her blood pressure.  She saw  Dr. Jonny Ruiz on the Tuesday prior to admission.  He felt that she may have a  bowel problem.  The patient has had some constipation.  She subsequently  had shakiness and some nausea.   Because of this increasing weakness, the patient was admitted to the  hospital with an elevated blood pressure for further workup.  The  patient does probably has significant depression.  There has been a  recent 9-pound weight loss.   She currently is feeling somewhat better here in the hospital.  She has  been placed on Rocephin for question of a UTI.   Her constipation seems to be somewhat better on MiraLax.   From a cardiac perspective, she has seen Dr. Graciela Husbands a couple of times, I  think most recently on July 16, 2007.  She is on Coumadin for a  history of atrial fibrillation and pulmonary emboli.  He noted that her  blood pressure had been very hard to control and felt that her  arrhythmia was stable.   As far as I can tell, she has not had a recent stress test or a recent  echo.  The only echo I see in the E-chart is from July of 2006.  At that  time, she had no significant valvular disease and an EF of 55%-65%.   On talking to the patient, she is not having chest pain.  She is not  having dyspnea.  She  is not having palpitations.  There has been no  presyncope.  She had a headache on admission, which is now gone.   REVIEW OF SYSTEMS:  Otherwise negative.   PAST MEDICAL HISTORY:  Remarkable for aspiration pneumonia, postherpetic  neuralgia, PAF on Coumadin, history of PE, iron deficiency anemia,  hypertension, type 2 diabetes, depression, previous ablation, GERD,  vitamin B12 deficiency, hyperlipidemia, and osteoporosis.   PREVIOUS SURGERIES:  Cataracts, carpal tunnel, hysterectomy, and  appendectomy.   SOCIAL HISTORY:  The patient is widowed.  She lives with her daughter.  She does not smoke or drink.  She gets around with a cane.  She does not  drive.   FAMILY HISTORY:  Noncontributory.   MEDICATIONS:  Prior to admission:  1. Micardis 80 mg a day, which has been switched.  2. Lisinopril 10 mg a day.  3. Zebeta 5  mg a day.  4. Clonidine 0.3 mg b.i.d.  5. Iron.  6. Fosamax.  7. Furosemide 40 mg b.i.d.  8. MiraLax.  9. Amaryl 4 mg b.i.d.  10.Lantus 12 units subcutaneously.  11.Metformin 500 mg b.i.d.  12.Protonix 40 mg a day.  13.Plendil 2 mg a day.  14.Pravachol 40 mg a day.  15.Coumadin as directed.  16.Darvocet.  17.Macrobid.  18.Lactulose.  19.Zoloft 50 mg.   ALLERGIES:  She is allergic to Valium, Cardizem, Deltasone, Tequin,  Lyrica, amiodarone, Duragesic, and Levaquin.   PHYSICAL EXAMINATION:  GENERAL:  Remarkable for an elderly white female  in no distress.  She has IV Rocephin going through her IV.  VITAL SIGNS:  Blood pressure is 170/65, respirations 20, pulse 63, and  regular, temperature 97.5, and saturations 98% on room air.  HEENT:  Unremarkable.  NECK:  Carotids are without bruit.  No lymphadenopathy, thyromegaly, or  JVP elevation.  LUNGS:  Clear diaphragmatic motion.  No wheezing.  HEART:  S1-S2.  Normal heart sounds.  PMI is increased, but not  displaced.  ABDOMEN:  Protuberant.  She has a vertical hysterectomy scar.  There is  no bruits.  No  tenderness.  No hepatosplenomegaly or hepatojugular  reflux.  EXTREMITIES:  Distal pulses are intact.  No edema.  NEURO:  Nonfocal.  SKIN:  Warm and dry.  No muscular weakness.   EKG shows sinus rhythm, nonspecific ST-T wave changes.   LABS:  Remarkable for a negative CPK/Troponin is 0.19.  LDL cholesterol  is 67.  Hemoglobin A1c is 7.3, Pro-time is 2.2.  Vitamin B12 is high.  TSH is 0.88.   IMPRESSION:  1. Hypertension.  I think this is probably best last treated by      primary care who has been following her long term.  She has no      evidence of previous bronchospasm that I can tell, my own bias      would be to switch her Zebeta to labetalol 200 mg b.i.d. and      titrate up her lisinopril to a max of 40 mg a day.  Again, I will      leave this up to the primary service.  2. History of paroxysmal atrial fibrillation.  Followup with Dr.      Graciela Husbands.  Currently in sinus rhythm in the hospital.  She seems to be      a reasonable Coumadin candidate in regards to not falling, but if      her blood pressure remains markedly elevated, this may have to be      reassessed.  She does follow up in our Coumadin Clinic and will do      this on hospital discharge.  3. Diabetes, poorly controlled is identified by a high hemoglobin A1c.      Continue the followup with Dr. Jonny Ruiz.  Diabetic diet with decreased      carbohydrates consider addition Januvia.  4. The patient has had a history of pulmonary embolism with atrial      fibrillation and hypertension.  I think she should probably have an      outpatient echocardiogram.  5. Positive troponin with negative creatine phosphokinase.  The      patient is not having chest pain.  She has no previously documented      coronary disease.  I think that this is a nonspecific finding.      Consideration for outpatient Myoview can be given when the patient  has her followup echocardiogram.   Again, I do not think any further inpatient workup is  needed.  I will  leave titration of blood pressure medicines up to primary care, but  would probably recommend switching from Zebeta to labetalol 200 mg  b.i.d. and titrating up the lisinopril to a maximum dose of 40 mg  depending on what her systolic pressures.      Noralyn Pick. Eden Emms, MD, [facility]  Electronically Signed     PCN/MEDQ  D:  01/28/2008  T:  01/28/2008  Job:  [id number]

## 2011-01-14 NOTE — Consult Note (Signed)
NAMEMAKENDRA, VIGEANT NO.:  [id number]   MEDICAL RECORD NO.:  [id number]          PATIENT TYPE:  INP   LOCATION:  5006                         FACILITY:  [facility]   PHYSICIAN:  Maree Krabbe, M.D.DATE OF BIRTH:  Jan 15, 1921   DATE OF CONSULTATION:  03/17/2008  DATE OF DISCHARGE:                                 CONSULTATION   REASON FOR CONSULTATION:  Uncontrolled hypertension.   HISTORY:  This is a pleasant 77 year old white female with a history of  hypertension times several years.  Over the last 2 months it has been  poorly controlled according to the patient.  She does have episodes of  flushing with when her blood pressure is up, however, denies any  episodes of excessive sweating, palpitations, or headaches.  She was  admitted to the hospital here in May and had a CT scan of the abdomen  with IV contrast which showed an adrenal mass which was most consistent  radiographically with a myelolipoma.  She also had a renal cell  carcinoma diagnosed in 2008 by MRI.  May 2009 CAT scan also showed some  atherosclerotic disease in the abdomen with celiac artery stenosis and  moderate left renal artery atherosclerosis.  She had an abdominal  ultrasound earlier this year which showed a 9.2 cm right kidney and a  10.4 cm left kidney with some simple cysts.  As far as other workup  this hospitalization she had just recently completed a 24-hour urine  collection for metanephrines and this screening test was negative for  excessive catecholamine breakdown products.  The 24-hour urine  creatinine showed an adequate collection.   The patient's current only complaint is that she appears flushed when  her blood pressure goes up.  She denies any shortness of breath, severe  headaches, palpitations, chest pain, abdominal pain, or difficulty  voiding.  Denies any ankle swelling or history of heart failure.  She  denies any GI complaints.  She passes her urine without difficulty.   She  has not had any history of stroke, TIA, or seizure.   PAST MEDICAL HISTORY:  Hypertension, diabetes type 2, history of DVT and  PE in the past, adrenal mass, and renal mass as above, GERD, vitamin B12  deficiency, paroxysmal atrial fibrillation, iron deficiency anemia,  depression, hyperlipidemia, osteoporosis, and previous cardiac ablation.   ALLERGIES:  VALIUM, DILTIAZEM, TEQUIN, LEVAQUIN, LYRICA, AND AMIODARONE.   CURRENT MEDICATIONS:  1. Rocephin 1 g q.24 h.  2. Catapres 0.2 t.i.d.  3. Plendil 10 daily.  4. Lasix 40 daily.  5. Iron pill.  6. Amaryl.  7. Lantus 10 at night.  8. Labetalol 300 b.i.d.  9. Lisinopril 20 b.i.d.  10.Glucophage 500 b.i.d.  11.Zoloft.  12.Zocor.  13.Coumadin.   SOCIAL HISTORY:  No tobacco history.   REVIEW OF SYSTEMS:  As above.   PHYSICAL EXAMINATION:  VITAL SIGNS:  Blood pressure lying was 188/76,  standing 176/68.  GENERAL:  This is an elderly, alert, pleasant white female in no  distress.  She has bounding pulses.  SKIN:  Warm and dry.  HEENT:  Unremarkable.  NECK:  Supple with no JVD or bruits.  CHEST:  Clear throughout.  CARDIAC:  Irregular rhythm.  No rub or gallop.  ABDOMEN:  Soft, no bruits, masses, or ascites.  EXTREMITIES:  No peripheral edema.  NEUROLOGIC:  Alert and oriented x3.   LABORATORY DATA:  Sodium 140, potassium 3.4, BUN 13, creatinine 1.02.  Estimated GFR 16 mL/min.  24-hour urine metanephrine 153 mcg/24 hours  and normetanephrines 349 mcg/24 hours which were both within normal  limits.0   IMPRESSION:  Refractory hypertension, possible considerations at this  point include renal vascular hypertension or primary aldosteronism in  terms of secondary cause of hypertension.  Would recommend doing an MRA  of the renal arteries as the first option.  If this is not possible  second best would be CT angiogram of the renal arteries.  I will order  PRA/PAC ratio to screen for primary aldosteronism.  Also, we will  resume  Micardis 80 mg a day which she was on earlier this year, as earlier  transcriptions suggest that she felt her blood pressure control was  better before it was stopped.  We will follow.      Maree Krabbe, M.D.  Electronically Signed     RDS/MEDQ  D:  03/17/2008  T:  03/18/2008  Job:  [id number]

## 2011-01-14 NOTE — Discharge Summary (Signed)
NAMELACHANDRA, DETTMANN NO.:  [id number]   MEDICAL RECORD NO.:  [id number]          PATIENT TYPE:  OUT   LOCATION:  XRAY                         [facility]ACILITY:  [facility]   PHYSICIAN:  Valerie A. Felicity Coyer, MDDATE OF BIRTH:  1921-01-19   DATE OF ADMISSION:  04/29/2007  DATE OF DISCHARGE:  04/29/2007                               DISCHARGE SUMMARY   DISCHARGE DIAGNOSES:  1. Multifactorial respiratory failure with recurrent aspiration      pneumonia.  2. Diabetes type 2.  3. Postherpetic neuralgia.  4. Hypertension.  5. Depression.  6. Anemia.  7. Gastroesophageal reflux disease.  8. Debilitation.   HISTORY OF PRESENT ILLNESS:  Ms. Lindsay Sheppard is an 77 year old female who was  admitted on June 18, 2005 and discharged on July 01, 2005 for  multifactorial respiratory failure. After her hospitalization, she was  transferred to the subacute care unit for additional therapy prior to  discharge to home.   PAST MEDICAL HISTORY:  1. Hospitalization September 2006 for pneumonia with chest tube      placement.  2. Paroxysmal atrial fibrillation.  3. Iron-deficiency anemia.  4. Hypertension.  5. Diabetes type 2.  6. Depression.  7. History of pleural effusion July 2006.  8. Arrhythmia with ablation, remote.  9. Status post cataract surgery.  10.Status post carpal tunnel release.  11.Status post total abdominal hysterectomy.  12.Status post appendectomy.   HOSPITAL COURSE:  Multifactorial respiratory failure with recurrent  aspiration pneumonia.  The patient completed IV antibiotics during her  inpatient admission.  She continued her therapy in the [facility] at [facility].  She was continued on Coumadin per pharmacy protocol for her  history of PE as well as maintained on her home blood pressure and  depression medications. Her post herpetic neuralgia was stable on  Neurontin   DISCHARGE MEDICATIONS:  1. Lescol 80 mg p.o. q.h.s.  2. Protonix 40 mg p.o. daily.  3. Zebeta 5  mg p.o. daily.  4. Avapro 300 mg p.o. daily.  5. Ferrous sulfate 325 mg p.o. b.i.d.  6. Lexapro 10 mg p.o. daily.  7. Catapres 0.2 mg p.o. b.i.d.  8. Neurontin 400 mg p.o. t.i.d.  9. Lasix 40 mg p.o. daily.  10.Coumadin 3 mg p.o. daily.  11.Lantus insulin 10 unit injection subcu q.h.s.  12.Amaryl 4 mg p.o. daily.  13.Plendil 10 mg p.o. daily.   DISPOSITION:  The patient was discharged to home.   FOLLOW UP:  The patient is instructed to follow up with Dr. Oliver Barre  in the Coumadin clinic.      Sandford Craze, NP      Raenette Rover. Felicity Coyer, MD  Electronically Signed    MO/MEDQ  D:  06/01/2007  T:  06/02/2007  Job:  [id number]

## 2011-01-14 NOTE — Discharge Summary (Signed)
Lindsay Sheppard, Lindsay Sheppard NO.:  [id number]   MEDICAL RECORD NO.:  [id number]          PATIENT TYPE:  INP   LOCATION:  5006                         FACILITY:  [facility]   PHYSICIAN:  Corwin Levins, MD      DATE OF BIRTH:  14-Jul-1921   DATE OF ADMISSION:  03/13/2008  DATE OF DISCHARGE:  03/23/2008                               DISCHARGE SUMMARY   DISCHARGE DIAGNOSES:  1. Uncontrolled hypertension.  2. Hypokalemia.  3. Diabetes type 2.  4. Borderline depressed thyroid-stimulating hormone.  We will need      follow up thyroid function tests in 4-6 weeks as an outpatient.  5. Left adrenal mass/left renal mass, outpatient followup with      Urology.  6. Atrial fibrillation, rate controlled with therapeutic INR, on      Coumadin.  7. History of anemia, hemoglobin of 13 this admission.  Plan to      continue iron.  8. Gastroesophageal reflux disease.  9. Chronic constipation.  10.Urinary tract infection.   HISTORY OF PRESENT ILLNESS:  Lindsay Sheppard is an 77 year old white female  who was admitted on March 13, 2008, with chief complaint of burning from  the inside for several months.  She also noted weakness and elevated  blood pressure.  The family brought her to the [facility] Emergency  Department mainly due to increased blood pressure on the morning of  admission as well as progressive weakness.  Upon discussion with the  patient, the patient reported burning all over feeling like it is on  the inside.  The patient reports symptoms had been worse over the last  few weeks.  She was admitted for further evaluation and treatment.   1. Ambulatory hypertension.  2. Type 2 diabetes.  3. 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  4. Chronic anticoagulation secondary to chronic DVT in left hemipelvis      and history of PE.  5. Left renal mass and left adrenal mass.  MRI performed on August      2008 consistent with renal cell carcinoma, left adrenal mass,      seemingly myelolipoma,  which is being followed by Urology.  6. GERD with stricture.  7. History vitamin B12 deficiency.  8. History of PE.  9. History of paroxysmal atrial fibrillation.  10.History of aspiration pneumonia.  11.History of failure to thrive on admission, June 2009.  12.History of iron deficiency anemia.  13.Depression.  14.Hyperlipidemia.  15.Osteoporosis.  16.Previous cardiac ablation.   COURSE OF HOSPITALIZATION:  1. Uncontrolled hypertension.  The patient was admitted.  She was      noted to have elevated blood pressure on admission of 212/105.      Urine was sent for metanephrines and catecholamines during this      admission that was negative.  Multiple medications and dosages were      changed, please see below, in order to achieve stable blood      pressure.  She was noted to have some mild orthostatic hypotension.      She was seen by Physical Therapy during this  admission and it was      felt that she had no home health skilled needs.  However, the      patient continues to complain of weakness.  She was treated for a      urinary tract infection during this admission, although she had a      negative culture and positive UA with a 7-day treatment of IV      Rocephin.  She is currently afebrile.   The patient was seen in consultation by Nephrology this admission,  initially by Dr. Arlean Hopping.  She underwent an MRA of the abdomen to rule  out renal artery stenosis and this did not show any critical renal  artery stenosis.  It redemonstrated again the left adrenal lesion  suggestive for myelolipoma.  At this time, the patient's blood pressure  is stable at 129/66, her heart rate is stable at 86.  She was afebrile.  We plan to discharge the patient to home for continued outpatient  monitoring.   The patient is currently undergoing a workup for hyperaldosteronism, as  the patient was noted to have mild hypokalemia during this admission.  Aldosterone has been added to her medicine  regimen in place of Lasix,  and per recommendation of Dr. Annie Sable, should her blood  pressure increase, she recommends titrating up aldactone as needed  rather than adding on more medications.   LABORATORY DATA:  Pertinent laboratories at time of discharge, BUN 11,  creatinine 0.83, INR 2.9, hemoglobin 31.1, hematocrit 39, and hemoglobin  A1c 7.7.   DISPOSITION:  She will be discharged to home.   FOLLOW UP:  The patient is to follow up with Dr. Annie Sable on  April 13, 2008, at 3:30 p.m., and to follow up with Dr. Oliver Barre in 2  weeks.   DISCHARGE MEDICATIONS:  1. Lantus 9 units subcutaneously once nightly.  2. Metformin 500 mg p.o. b.i.d.  3. Amaryl 4 mg p.o. b.i.d.  4. Iron 325 mg p.o. b.i.d.  5. Plendil 10 mg p.o. daily.  6. Pravastatin 40 mg p.o. daily.  7. __________ 5 mg p.o. daily.  8. MiraLax 17 grams in 8 ounces of water p.o. daily.  9. Lisinopril 20 mg p.o. b.i.d.  10.Clonidine 0.3 mg p.o. q.8h.  11.Coumadin 5 mg p.o. daily except for 2.5 mg on Mondays, Wednesdays,      and Fridays.  12.Aldactone 25 mg p.o. b.i.d.  13.Labetalol 400 mg p.o. b.i.d.  14.Fosamax 70 mg p.o. weekly.  15.Protonix 40 mg p.o. daily.  16.Darvocet-N 100 one tablet p.o. 4 times daily as needed.  17.Sertraline 50 mg p.o. daily.   Greater than 30 minutes were spent on discharge planning.       Sandford Craze, NP      Corwin Levins, MD  Electronically Signed    MO/MEDQ  D:  03/23/2008  T:  03/23/2008  Job:  [id number]   cc:   Corwin Levins, MD  Cecille Aver, M.D.  Sigmund I. Patsi Sears, M.D.

## 2011-01-16 ENCOUNTER — Ambulatory Visit (INDEPENDENT_AMBULATORY_CARE_PROVIDER_SITE_OTHER): Payer: Medicare Other | Admitting: *Deleted

## 2011-01-16 DIAGNOSIS — Z86718 Personal history of other venous thrombosis and embolism: Secondary | ICD-10-CM

## 2011-01-16 DIAGNOSIS — I4891 Unspecified atrial fibrillation: Secondary | ICD-10-CM

## 2011-01-16 DIAGNOSIS — I2699 Other pulmonary embolism without acute cor pulmonale: Secondary | ICD-10-CM

## 2011-01-16 MED ORDER — WARFARIN SODIUM 5 MG PO TABS: 5.0000 mg | ORAL_TABLET | ORAL | Status: DC

## 2011-01-17 NOTE — Consult Note (Signed)
NAMEHAGAN, Sheppard NO.:  [id number]   MEDICAL RECORD NO.:  [id number]          PATIENT TYPE:  INP   LOCATION:  2901                         FACILITY:  [facility]   PHYSICIAN:  Arvilla Meres, M.D. [facility] OF BIRTH:  03-10-1921   DATE OF CONSULTATION:  03/21/2005  DATE OF DISCHARGE:                                   CONSULTATION   PHYSICIANS:  1.  Primary care physician:  Corwin Levins, M.D.  2.  Cardiologist:  Duke Salvia, M.D.   REFERRING PHYSICIAN:  [name]. Sherene Sires, M.D.   REASON FOR CONSULTATION:  Atrial fibrillation with rapid ventricular  response.   HISTORY OF PRESENT ILLNESS:  Lindsay Sheppard is a delightful 77 year old female  with a history of atrial tachycardia, diabetes and hypertension who was  admitted to [facility]  on July 6 through July 10 for evaluation of  a right pleural effusion requiring chest tube placement which was initially  thought to be secondary to a fall and rib fractures.  However, it turned out  to be a likely parapneumonic process which had almost completely cleared by  the time of discharge.  However, she was readmitted to [facility]  on March 18, 2005 for chest pain and shortness of breath.  She was  subsequently found to have multiple small pulmonary emboli on CT scan and  was started on intravenous heparin.  This afternoon at about 4 p.m., she  developed a rapid atrial fibrillation with heart rates between 150 and 190.  She does admit to feeling somewhat weak with this but denies any chest pain,  shortness of breath, or presyncope.  She has been maintaining her systolic  blood pressure in the 150-160 range.  She was given two doses of IV  Lopressor 2.5 mg with no significant change in her heart rate.  Digoxin was  ordered but had not yet come to the floor.  She continues to run rates of  between 160 to 180.   Of note, she does have a history of atrial tachycardia and reportedly  underwent an ablation  procedure by Dr. Lewayne Bunting in 1999 which  dramatically reduced her arrhythmia burden.  However, she did see Dr. Graciela Husbands  recently and in his note he did make mention of atrial tachycardia and  possible atrial fibrillation.  However, there was some reluctance to start  her on Coumadin secondary to her iron-deficiency anemia.   REVIEW OF SYSTEMS:  On review of systems as per HPI and problem list.  Otherwise negative.   PAST MEDICAL HISTORY:  1.  Recent right pleural effusion requiring chest tube drainage as per HPI.  2.  Multiple pulmonary emboli diagnosed this admission.  3.  History of atrial tachycardia, status post ablation in 1999 by Dr. Lewayne Bunting.  4.  Diabetes.  5.  Hyperlipidemia.  6.  Iron-deficiency anemia.  7.  Gastroesophageal reflux disease.  8.  History of cardiac catheterization with insignificant coronary disease      in the 1980's.  9.  Status post appendectomy and hysterectomy.  CURRENT MEDICATIONS:  1.  Actos 30 mg daily.  2.  Amaryl 4 mg daily.  3.  Toprol-XL 100 mg daily.  4.  Elavil 25 mg at night.  5.  Protonix 40 mg b.i.d.  6.  Zocor 40 mg at night.  7.  Avapro 150 mg daily.  8.  IV heparin.   ALLERGIES:  1.  VALIUM.  2.  CARDIZEM which she thinks she had a rash with.  3.  DELTASONE.  4.  TEQUIN.  5.  LEVAQUIN.   SOCIAL HISTORY:  She was in [employer] with her son.  She is retired  Geophysicist/field seismologist.  She is a widowed.  She denies any tobacco or alcohol use.   FAMILY HISTORY:  Mother died in her 77's with a questionable myocardial  infarction.  Father died in his 48's due to colon cancer.  He apparently had  an MI and a cardiac arrest postoperatively.   PHYSICAL EXAMINATION:  GENERAL:  She is an elderly woman, lying in bed in no  acute distress.  VITAL SIGNS:  Blood pressure 160/98, heart rate ranging from 140-180. She is  afebrile at 98.6.  Saturation 96% on room air.  HEENT:  Sclerae anicteric.  Extraocular muscles were intact.   Conjunctivae  pale.  No xanthelasma.  Mucous membranes are moist.  NECK:  Supple.  JVP is approximately 8-9 cmH2O.  Carotids are 2+ bilaterally  without any bruits.  There is no evidence of lymphadenopathy or thyromegaly.  CARDIAC:  She is markedly tachycardic and irregular.  There is an RV lift.  There is no obvious murmur.  LUNGS:  Clear to auscultation with slightly decreased breath sounds at the  bases.  ABDOMEN:  Soft, nontender, nondistended.  There are good bowel sounds.  There is no hepatosplenomegaly. There are no bruits.  EXTREMITIES:  Warm but pale, but no cyanosis, clubbing or edema.  Distal  pulses are 1+ bilaterally.  NEUROLOGIC:  Alert and oriented x3.  Cranial nerves II-XII intact. Affect is  appropriate.  Strength is 5/5 in all extremities.   LABORATORY DATA:  She has a white count of 6, hemoglobin 10.9, hematocrit  32.5, platelets 268.  Sodium 132, potassium 3.7, chloride 99, bicarb 25, BUN  11, creatinine 1.1, glucose 156.  CK-MB and troponin are negative x1.  Chest  x-ray shows a small right pleural effusion and a moderate left-sided  effusion with evidence of COPD.  Chest CT scan showed bilateral pulmonary  emboli, small in the left upper and left lower lobe as well as a right upper  lobe.  EKG shows atrial fibrillation with rapid ventricular response at 168.  There is some mild ST depression in V6.  Of note, she was in normal sinus  rhythm on admission.   ASSESSMENT/PLAN:  [name] is a very pleasant 77 year old woman with a  history of atrial tachyarrhythmias, recently admitted for chest pain and  shortness of breath and found to have bilateral pulmonary emboli.  Her  course is now complicated by atrial fibrillation with rapid ventricular  response that is not responding to IV beta blocker.  Given her high rates  and mild ST depression, will go ahead and treat her with IV amiodarone in an attempt to convert her to sinus rhythm.  She is currently maintained on   heparin for her pulmonary emboli and will need to be discharged on Coumadin  so she will be covered with anticoagulation anyway.  We will also order an  echocardiogram as well  as check her TSH.  We will keep her potassium greater  than 4 and her magnesium greater  than 2.  Will transfer her to cardiac step-down for closer monitoring.  Will  also support her with IV fluids as she needs given that she is likely  preload-dependent with her PE.  Should she hemodynamically decompensate, she  will need emergent electrical cardioversion.       DB/MEDQ  D:  03/21/2005  T:  03/23/2005  Job:  [id number]

## 2011-01-17 NOTE — H&P (Signed)
Lindsay Sheppard, HESLIN NO.:  [id number]   MEDICAL RECORD NO.:  [id number]          PATIENT TYPE:  INP   LOCATION:  6706                         FACILITY:  [facility]   PHYSICIAN:  Shan Levans, M.D. LHCDATE OF BIRTH:  1921/03/13   DATE OF ADMISSION:  05/11/2005  DATE OF DISCHARGE:                                HISTORY & PHYSICAL   CHIEF COMPLAINT:  Shortness of breath, cough, and wheezing x2 days.   HISTORY OF PRESENT ILLNESS:  The patient is an 77 year old white female  patient of Dr. Sherene Sires with a known history of recurrent pleural effusions.  The patient was initially found to have a right pleural effusion around the  end of June this year.  The patient had a fall with subsequent rib fractures  and a progressive history of shortness of breath and was found to have a  right upper effusion.  She was seen by Dr. Sherene Sires.  On June 29, she underwent  a thoracentesis.  The thoracentesis was exudative and negative for  malignancy.  On subsequent followup, the patient had minimum symptoms with  reaccumulation of effusion.  The patient was hospitalized on July6, and a  chest tube was placed.  The patient did have some paroxysmal atrial  tachycardia during this admission, was seen by Dr. Graciela Husbands.  On July 18, the  patient had a new formation of left effusion and was hospitalized.  The  patient did develop some paroxysmal supraventricular tachycardia during  hospitalization and was in atrial fibrillation.  The patient was  hospitalized and was noted to have bilateral pulmonary emboli.  The patient  was started on Coumadin therapy and begun on amiodarone by Dr. Graciela Husbands.  Recent followup in the office last week on September 6 with Dr. Sherene Sires per the  family chest x-ray showed a small left effusion, and patient was doing well.  However, over the last 2 days the patient has developed a productive cough  with yellow sputum, shortness of breath, and wheezing. She went to [facility] this morning and was noted to have a moderate size left  effusion.  The patient was given an albuterol nebulizer treatment for her  wheezing which had decreased somewhat.  The patient denies any fever, chest  pain, leg swelling, hemoptysis.  The patient will require hospitalization  for further evaluation and treatment options.   PAST MEDICAL HISTORY:  1.  Hypertension.  2.  Diabetes mellitus.  3.  Hyperlipidemia.  4.  Arrhythmia with previous ablation greater than 10 years ago.  5.  Status post cataract surgery.  6.  Status post carpal tunnel surgery.  7.  Status post total abdominal hysterectomy.  8.  Appendectomy.   CURRENT MEDICATIONS:  1.  Lasix 40 mg daily.  2.  Protonix 40 mg daily.  3.  Avandia 8 mg daily.  4.  Glyburide and metformin 5/500 two p.o. daily.  5.  Iron 325 mg daily.  6.  Toprol XL 50 mg daily.  7.  Plendil 10 mg daily.  8.  Avapro 150 mg daily.  9.  Lescol  XL 80 mg nightly.  10. Amiodarone 200 mg daily.  11. Amitriptyline 25 mg daily.  12. Coumadin 3.75 mg daily.  13. Actonel 35 mg weekly.  14. Hydro-DP cough syrup p.r.n.   ALLERGIES:  1.  TEQUIN causes hypoglycemia.  2.  VALIUM.  3.  CARDIZEM.  4.  LEVAQUIN.  5.  __________.   SOCIAL HISTORY:  The patient is widowed, is retired.  Her son lives with her  at home.  She has recently been staying with one of her daughters.  She is a  never smoker.  Denies any alcohol.   FAMILY HISTORY:  Noncontributory.   REVIEW OF SYSTEMS:  Essentially negative except as noted above.   PHYSICAL EXAMINATION:  GENERAL:  The patient is in no acute distress.  VITAL SIGNS:  Temperature 97.9, blood pressure 157/84, heart rate 93,  respiratory rate 24, O2 saturation 94% on room air.  Weight is at 154.9.  HEENT:  Normocephalic and atraumatic.  PERRLA.  NECK:  Supple without cervical adenopathy and no JVD.  Carotids are equal  with positive upstrokes bilaterally without bruits.  LUNGS:  Bibasilar crackles with a  few expiratory wheezes.  CARDIAC:  Regular rate.  No murmurs, rubs, or gallops.  ABDOMEN:  Obese, soft, without hepatosplenomegaly.  No guarding or rebound  noted.  EXTREMITIES:  Warm without clubbing, cyanosis, or edema.  Negative Homan's  sign.  No edema.  NEUROLOGIC:  No focal deficits detected.   LABORATORY DATA:  Chest x-ray reveals a left, moderate size pleural  effusion.   CBG 81.   IMPRESSION AND PLAN:  1.  Recurrent left pleural effusion.  The patient will be set up for a CT      chest.  CT chest is pending.  2.  Probable underlying upper respiratory infection versus bronchitis.  The      patient has been started on Xopenex nebulizer treatment and Mucinex      added in.  3.  Diabetes mellitus.  The patient will continue on home medicines except      for metformin which will be held.  4.  Hypertension.  Blood pressure is slightly elevated today.  The patient      is on ACE and ARB.  5.  Atrial fibrillation, treated with amiodarone and Coumadin therapy.  6.  Previous pulmonary embolism.  The patient is currently on Coumadin.      PT/INR is pending.      Rubye Oaks, NP [facility]      Shan Levans, M.D. [facility]  Electronically Signed    TP/MEDQ  D:  05/11/2005  T:  05/11/2005  Job:  [id number]   cc:   [name]. Sherene Sires, M.D. [facility]  [address]  [place]  [place] [address]

## 2011-01-17 NOTE — Discharge Summary (Signed)
NAMEMAKAILAH, SLAVICK NO.:  [id number]   MEDICAL RECORD NO.:  [id number]          PATIENT TYPE:  INP   LOCATION:  3006                         FACILITY:  [facility]   PHYSICIAN:  Rene Paci, M.D. LHCDATE OF BIRTH:  24-Feb-1921   DATE OF ADMISSION:  06/18/2005  DATE OF DISCHARGE:  07/01/2005                                 DISCHARGE SUMMARY   DISCHARGE DIAGNOSES:  1.  Multifactorial respiratory failure with recurrent aspiration pneumonia      status post accidental opiate overdose.  2.  Hypernatremia/hypokalemia.  3.  Postherpetic neuralgia.   HISTORY OF PRESENT ILLNESS:  Patient is an 77 year old female who was  brought to the emergency room from home on June 18, 2005 when her family  found her difficult to wake up on the morning of admission.  Patient had  started a new Duragesic patch 50 mcg on the night prior to admission  secondary to pain from previous shingles surrounding the right eye.  A chest  x-ray was performed in the emergency room which was found to reveal a  probable aspiration pneumonia left greater than right.  Patient was admitted  for further evaluation.   PAST MEDICAL HISTORY:  1.  Status post hospitalization September 2006 for a pneumonia with Dr. Sherene Sires      with chest tube placement.  2.  Paroxysmal atrial fibrillation.  3.  Iron deficiency anemia.  4.  Hypertension.  5.  Diabetes type 2.  6.  Depression.  7.  History of PE July 2006.  8.  Arrhythmia with ablation greater than 10 years ago.  9.  Status post cataract surgery.  10. Status post carpal tunnel release.  11. Status post total abdominal hysterectomy.  12. Status post appendectomy.   HOSPITAL COURSE:  #1 - MULTIFACTORIAL RESPIRATORY FAILURE WITH RECURRENT  ASPIRATION PNEUMONIA STATUS POST ACCIDENTAL OPIATE OVERDOSE:  Patient was  admitted and fentanyl patches discontinued.  Patient was placed on IV  antibiotics which included IV Zosyn and IV vancomycin.  Patient  completed a  14-day course of IV antibiotics with slow improvement.   #2 - POSTHERPETIC NEURALGIA:  Patient had complaints of severe neuropathic  pain secondary to recent shingles which was the initial reason for a  fentanyl patch.  Patient was given a trial of Lyrica which induced  psychomotor agitation.  Lyrica was discontinued and patient was placed on a  trial of Neurontin which has controlled patient's symptoms.   #3 - HYPERNATREMIA/HYPOKALEMIA:  Lasix was initially held.  Patient was  given gentle hydration and potassium was repleted.  These issues resolved  and patient has been resumed on her home dosing of Lasix.   DISCHARGE MEDICATIONS:  1.  Protonix 40 mg p.o. daily.  2.  Zebeta 5 mg p.o. daily.  3.  Zocor 40 mg p.o. q.h.s.  4.  Plendil 10 mg p.o. daily.  5.  Avapro 300 mg p.o. daily.  6.  Ferrous sulfate 325 mg p.o. b.i.d.  7.  Atrovent nebulizers q.8h.  8.  Xopenex 1.25 mg nebulizers q.8h.  9.  Lexapro 10 mg p.o. daily.  10. Nystatin ointment b.i.d. to affected area.  11. Colace 100 mg p.o. b.i.d.  12. Catapres 0.2 mg p.o. b.i.d.  13. Neurontin 300 mg p.o. t.i.d.  14. Lasix 40 mg p.o. daily.  15. Coumadin daily per pharmacy protocol.  16. Lantus insulin 10 units subcutaneous q.h.s.  17. Ensure Plus one can p.o. b.i.d.   PHYSICAL EXAMINATION:  VITAL SIGNS:  Blood pressure 136/62, heart rate 74,  respiratory rate 18, temperature 98.3, O2 saturation 99% on 2 L.  GENERAL:  Patient is an elderly white female awake, alert, in no acute  distress.  CARDIOVASCULAR:  S1, S2.  Regular rate and rhythm.  LUNGS:  Clear to auscultation bilaterally.  No wheezes, rales, or rhonchi.  ABDOMEN:  Soft, nontender, nondistended.  Positive bowel sounds.  EXTREMITIES:  Trace lower extremity edema.  NEUROLOGIC:  Patient is awake, alert, moving all extremities.   DISCHARGE LABORATORIES:  INR is 2.7.  Hemoglobin 8.6, hematocrit 26.1.  BUN  11, creatinine 1.2.   FOLLOW-UP:  We will  continue to follow the patient during her stay in the  [facility].  Upon discharge from [facility] patient will need follow-up with her primary  care doctor, Dr. Oliver Barre.      Melissa S. Peggyann Juba, NP      Rene Paci, M.D. [facility]  Electronically Signed    MSO/MEDQ  D:  07/01/2005  T:  07/01/2005  Job:  [id number]   cc:   Corwin Levins, M.D. [facility]  [address]  [place]  [place] [address]

## 2011-01-17 NOTE — Discharge Summary (Signed)
NAMEOLIVER, NEUWIRTH                ACCOUNT NO.:  [id number]   MEDICAL RECORD NO.:  [id number]          PATIENT TYPE:  INP   LOCATION:  3711                         FACILITY:  [facility]   PHYSICIAN:  Casimiro Needle B. Sherene Sires, M.D. [facility] OF BIRTH:  Jan 17, 1921   DATE OF ADMISSION:  03/18/2005  DATE OF DISCHARGE:  03/26/2005                                 DISCHARGE SUMMARY   FINAL DIAGNOSES:  1.  Acute bilateral pulmonary emboli associated with a small left pleural      effusion.  2.  Status post recent hospital for parapneumonic right effusion requiring      chest tube placement and therefore not on anticoagulation during her      hospitalization.  3.  PAT.  Evaluated by Dr. Graciela Husbands with the recommendation that she stay on      amiodarone for four weeks.  Echocardiogram this admission normal with no      evidence of right ventricular strain.  4.  Diabetes.  5.  Hypertension.  6.  Osteoporosis.  7.  Iron deficiency anemia.  Stool guaiacs pending at the time of dictation      and will need close follow-up since now on anticoagulation.   HISTORY:  Please see dictated H&P.  This patient had recently been at [facility] for evaluation and treatment of a right parapneumonic effusion  requiring chest tube placement.  The right lung completely expanded and the  chest tube was removed and the patient was discharged to follow up.  However, on follow-up in the office she was feeling more short of breath  with vague chest discomfort and a new left pleural effusion was documented.  This led to a CT scan which led to the diagnosis of new multiple pulmonary  emboli that had not been present on previous CT scan.  She was admitted to  the hospital and placed on IV heparin and received a total of seven days of  heparin with two days of overlap on Coumadin.   Her course was complicated by rapid atrial tachycardia which Dr. Graciela Husbands felt  was PAT and reported a history of allergy to Cardizem, so was treated  acutely with amiodarone with adequate control.  TSH normal this admission.  She is being discharged on amiodarone 200 mg two q.a.m. with baseline PFTs  requested.   Her other medications at time of discharge include Glyburide/Metformin 5/500  two b.i.d., Avandia 8 mg q.a.m., furosemide 40 mg q.a.m., Toprol XL 100 mg  daily, Lescol 80 mg q.p.m., amitriptyline 25 mg q.h.s., Protonix 40 mg  q.a.m. before breakfast, Actonel 35 mg weekly, Coumadin 5 mg tablets to take  one-half daily until seen in the Coumadin clinic in two days, iron sulfate  325 mg t.i.d., Plendil 10 mg daily, Avapro 300 mg daily.   At the time of discharge she is fully ambulatory and has been approved by  physical therapy and occupational therapy for discharge.  She is saturating  well on room air with normal vital signs with a hemoglobin hematocrit that  have been trending slightly upward and at discharge her hematocrit  is 31.6%.  She has been advised on a low salt, low carbohydrate diet.   She is to see Dr. Graciela Husbands in four weeks for follow-up LFTs, TSH.  She will see  Dr. Sherene Sires in one week.       MBW/MEDQ  D:  03/26/2005  T:  03/26/2005  Job:  [id number]   cc:   Corwin Levins, M.D. [facility]

## 2011-01-17 NOTE — Op Note (Signed)
NAMEALYRICA, Lindsay Sheppard                ACCOUNT NO.:  [id number]   MEDICAL RECORD NO.:  [id number]          PATIENT TYPE:  INP   LOCATION:  6706                         FACILITY:  [facility]   PHYSICIAN:  Casimiro Needle B. Sherene Sires, M.D. [facility] OF BIRTH:  1920-11-10   DATE OF PROCEDURE:  05/16/2005  DATE OF DISCHARGE:                                 OPERATIVE REPORT   HISTORY:  77 year old white female with nonspecific exudative left pleural  effusion and persistent left lower lobe atelectasis.  Bronchoscopy is being  performed to exclude an endobronchial process such as a mucous plug or  neoplasm from the differential diagnosis.  The patient agreed to the  procedure after a full discussion of the risks, benefits, and alternatives  with her daughter.   The procedure was performed in the bronchoscopy suite with continuous  monitoring by surface ECG and oximetry.  The patient maintained adequate  saturations throughout the procedure and sinus rhythm.  She received 10%  lidocaine spray, 1 mg IV Versed, and 25 mg IV Demerol for adequate sedation  and cough suppression.  The right nares was easily cannulated using a  standard flexible bronchoscope.  The cords moved normally and there were no  apparent upper airway lesions.  Using an additional 1% lidocaine as needed,  the entire tracheobronchial tree was explored bilaterally with the following  findings:   Diffuse airway edema was present with mostly mucoid thick secretions  throughout the major airways.  There were no definite mucous plugs, however.  I was able to suction all the airways free with the underlying mucosa  showing mild erythema and friability but no focal lesions.  I specifically  explored the left lower lobe.  There was minimal extrinsic compression of  the left lower lobe but no obvious endobronchial or obstructing process.   Bronchial lavage was obtained on the left side and sent for AFB and fungal  stain and culture as well as routine  stain and culture and cytologies.   The patient tolerated the procedure well.   IMPRESSION:  Atelectasis secondary to pleural effusion with retained  secretions and poor cough mechanics.  We will continue to work on improving  airways function with bronchodilators and IV steroids over the coming  weekend and check cultures on the above studies when available.           ______________________________  [name]. Sherene Sires, M.D. [facility]     MBW/MEDQ  D:  05/16/2005  T:  05/16/2005  Job:  [id number]

## 2011-01-17 NOTE — H&P (Signed)
NAMEMINNIE, Lindsay Sheppard                ACCOUNT NO.:  [id number]   MEDICAL RECORD NO.:  [id number]          PATIENT TYPE:  INP   LOCATION:                               FACILITY:  [facility]   PHYSICIAN:  Casimiro Needle B. Sherene Sires, M.D. [facility] OF BIRTH:  16-Oct-1920   DATE OF ADMISSION:  03/06/2005  DATE OF DISCHARGE:                                HISTORY & PHYSICAL   CHIEF COMPLAINT:  Dyspnea.   HISTORY:  An 77 year old white female seen on June 29 with new onset dyspnea  associated with a chest wall injury that occurred three weeks ago after  falling at home.  In retrospect, her family tells me today that for the  first time that she was not actually feeling good at the time she fell and  had been suffering from a virus consisting of a cough and congestion and  indeed on chart review she had been seen on June 5 with symptoms of cough  for three days.  She had no fever then nor pleuritic pain and was treated  with Ceftin.  She then fell three weeks prior to when I saw her on June 29  apparently fracturing multiple ribs on the right and when I saw her had  already undergone x-rays indicating multiple rib fractures on the right and  a large right pleural effusion that I assumed was hemothorax.  However, on  thoracentesis on June 29 the fluid was exudative with reactive cytologies,  but no evidence of malignancy.  She came back today to the office for follow-  up chest x-ray stated her dyspnea had worsened to the point where she was  having trouble lying down or taking a deep breath comfortably and therefore  I recommended placing her in the hospital for chest tube placement.  She  denies any sputum production at present, significant leg swelling, myalgias,  arthralgias, fevers, chills, sweats.  She is short of breath with anything  more than walking from room to room slowly and can not lie flat at night  because of __________ orthopnea.   PAST MEDICAL HISTORY:  1.  Hypertension.  2.  Diabetes.  3.   Hyperlipidemia.  4.  Status post cholecystectomy.  5.  Status post hysterectomy.  6.  Status post appendectomy.  7.  Status post recent hand surgery.  8.  History of GERD.  Normal she had a normal upper endoscopy on May 31, 2001.   ALLERGIES:  VALIUM, DELTASONE, CARDIZEM, TEQUIN, LEVAQUIN all with  nonspecific reactions.   MEDICATIONS:  1.  Baby aspirin one daily.  2.  Actonel 35 mg one weekly.  3.  Glyburide/Metformin 5/500 one b.i.d.  4.  Protonix 40 mg daily.  5.  Toprol XL 100 mg daily.  6.  Avandia 8 mg daily.  7.  Elavil 25 mg q.h.s.  8.  Lasix 40 mg daily.  9.  Lotrel 10/20 one daily.  10. Lescol 80 mg one daily.  11. B12 injections monthly.   SOCIAL HISTORY:  She has never smoked.  She is a retired Agricultural engineer.  FAMILY HISTORY:  Positive for cancer in both mother and father.  Negative  for respiratory diseases or premature heart disease.   REVIEW OF SYSTEMS:  Taken in detail and essentially negative except as  outlined above.   PHYSICAL EXAMINATION:  GENERAL:  This is a chronically ill-appearing  ambulatory white female in no acute distress.  VITAL SIGNS:  She is afebrile; however, her pulse rate is 111 at rest.  HEENT:  Unremarkable.  Pharynx is clear.  Dentition is intact.  NECK:  Supple without cervical adenopathy or tenderness.  Trachea is  midline.  No thyromegaly.  LUNGS:  Diminished breath sounds at the right base.  Overall air movement is  adequate.  There is dullness also at the right base.  There was no  tenderness over the chest wall today on examination.  No ecchymoses.  HEART:  Regular rate and rhythm without murmurs, rubs, or gallops.  No  increase in _________.  ABDOMEN:  Soft, benign.  No palpable organomegaly or mass.  No tenderness or  ascites.  EXTREMITIES:  Warm without calf tenderness, clubbing, cyanosis, edema.  NEUROLOGIC:  Normal.  SKIN:  Normal.   Chest x-ray today shows reaccumulation of a large right pleural effusion.    IMPRESSION:  Exudative right pleural effusion, probably peripneumonic based  on the fact that she had significant cough before she fell and fractured her  ribs.  The fluid was not bloody nor suggestive of a typical Dressler's  syndrome that can occur in post chest wall injury in that these typically  are at least blood tinged if not frankly bloody.   The first step in the work-up, therefore, is to completely evacuate the  right lung and look at the underlying lung parenchyma and be sure it  completely re-expands and if not, consider bronchoscopy.  If it does re-  expand but the fluid continues to form the next step would be a VATS.   We discussed all these issues with the patient and her daughter in detail  and they agreed to proceed with admission to the hospital for chest tube  placement today.           ______________________________  [name]. Sherene Sires, M.D. [facility]     MBW/MEDQ  D:  03/06/2005  T:  03/06/2005  Job:  [id number]   cc:   Corwin Levins, M.D. [facility]

## 2011-01-17 NOTE — Op Note (Signed)
NAMEJANEY, Lindsay Sheppard                ACCOUNT NO.:  [id number]   MEDICAL RECORD NO.:  [id number]          PATIENT TYPE:  AMB   LOCATION:  DSC                          FACILITY:  [facility]   PHYSICIAN:  Katy Fitch. Sypher Montez Hageman., M.D.DATE OF BIRTH:  1921/05/24   DATE OF PROCEDURE:  09/05/2004  DATE OF DISCHARGE:                                 OPERATIVE REPORT   PREOPERATIVE DIAGNOSIS:  Chronic polyneuropathy with evidence of compressive  neuropathy, right ulnar nerve at cubital tunnel and right median nerve at  carpal canal.   POSTOPERATIVE DIAGNOSIS:  Chronic polyneuropathy with evidence of  compressive neuropathy, right ulnar nerve at cubital tunnel and right median  nerve at carpal canal.   OPERATION PERFORMED:  1.  Release of right ulnar nerve at cubital tunnel.  2.  Release of right transverse carpal ligament.   SURGEON:  Katy Fitch. Sypher, M.D.   ASSISTANT:  Jonni Sanger, P.A.   ANESTHESIA:  General by LMA.   SUPERVISING ANESTHESIOLOGIST:  Bedelia Person, M.D.   INDICATIONS FOR PROCEDURE:  Johnna Bollier is an 77 year old woman referred by  Dr. Oliver Barre for evaluation and management of right hand numbness.  Clinical examination revealed signs of carpal tunnel syndrome and probable  ulnar neuropathy at the cubital tunnel.  She had provocative signs of  entrapment neuropathy at both levels.  Electrodiagnostic studies completed  by Laurier Nancy, M.D. revealed signs of significant motor-sensory  polyneuropathy with superimposed entrapment neuropathy of the median nerve  at the level of the carpal canal. and severe entrapment neuropathy of the  ulnar nerve at the cubital tunnel on the right.   DESCRIPTION OF PROCEDURE:  After informed consent, Ms. Fern was brought to  the operating room at this time anticipating release of her right transverse  carpal ligament and decompression of her right ulnar nerve at the cubital  tunnel.  Aliesha Dolata was brought to the operating room  and placed in supine  position on the operating table.  Following induction of general anesthesia  by LMA the right arm was prepped with Betadine soap and solution and  sterilely draped.  Following exsanguination of the limb with an Esmarch  bandage, an arterial tourniquet on the proximal brachium was inflated to 220  mmHg.  The procedure commenced with a short incision in the line of the ring  finger in the palm.  The subcutaneous tissues were carefully divided  revealing the palmar fascia. This was split longitudinally to reveal the  common sensory branch of the median nerve and the superficial palmar arch.  The common sensory branches were followed back to the median nerve proper  which was separated from the transverse carpal ligament with a Child psychotherapist.  The transverse carpal ligament was then released with scissors  subcutaneously extending into the distal forearm.  This widely opened the  carpal canal.  No masses or other predicaments were noted.  Bleeding points  along the margin of the released ligament were electrocauterized with  bipolar current followed by repair of the skin with intradermal 3-0 Prolene  suture.  Attention was then directed to the elbow.  A short incision was fashioned  just posterior to the medial epicondyle.  Subcutaneous tissues were  carefully divided taking care to identify the ulnar nerve through the  brachial fascia.  The arcuate ligament was isolated and released with  scissors followed by release of multiple fascial bands deep to the head of  the flexor carpi ulnaris distally.  The nerve was decompressed 7 cm distal  to the epicondyle into the proximal forearm.  A large pseudoneuroma was  noted just proximal to the fascial bands at the head of the flexor carpi  ulnaris.  This appeared to be the primary source of compression.  The  brachial fascia was released proximally at least 7 cm above the epicondyle  and the ligament of Struthers was  released.  There were no apparent anatomic  predicaments of the triceps tendon.  The wound was inspected for bleeding  points which were electrocauterized with bipolar current followed by repair  of the skin with intradermal 3-0 Prolene.  Compressive dressing was applied  with a volar plaster splint at the wrist and a Tegaderm dressing with  sterile gauze and Ace wrap at the elbow.  We have advised Ms. Ace to begin  immediate finger and thumb motion exercises as well as elbow flexion and  extension exercises.   She will return to our office in follow-up in approximately one week for a  dressing change and advancement to a therapy exercise program.      [place]   RVS/MEDQ  D:  09/05/2004  T:  09/05/2004  Job:  [id number]   cc:   Corwin Levins, M.D. [facility]

## 2011-01-17 NOTE — H&P (Signed)
NAMETAMILYN, LUPIEN                ACCOUNT NO.:  [id number]   MEDICAL RECORD NO.:  [id number]          PATIENT TYPE:  INP   LOCATION:  6737                         FACILITY:  [facility]   PHYSICIAN:  Casimiro Needle B. Sherene Sires, M.D. [facility] OF BIRTH:  1920/11/09   DATE OF ADMISSION:  03/18/2005  DATE OF DISCHARGE:                                HISTORY & PHYSICAL   CHIEF COMPLAINT:  Cough, shortness of breath, and weakness.   HISTORY:  An 77 year old white female admitted to [facility] July 6  through July 10 for evaluation of a right pleural effusion requiring chest  tube placement for presumed parapneumonic process which had almost  completely cleared by the time of discharge by CT scan and chest x-ray  showing improving aeration of the right lung, minimal residual right pleural  effusion, and benign exudative features on cytology.  She states she was  definitely better after discharge from the hospital, but four days ago  noticed increasing symptoms of cough, generalized weakness, and midline  chest discomfort during coughing paroxysm anteriorly associated with chills  but no definite fever or sputum production, lateralizing pleuritic pain,  orthopnea, PND, but has noticed significant increasing dyspnea over baseline  with activity.  She also notes poor appetite and p.o. intake.   The patient denies any history of dysphagia or choking on food, active sinus  or reflux symptoms, or recent dental work.   She was seen in the office the morning of July 18 with a new left pleural  effusion, and because she had just recovered from a cytogenic right pleural  effusion with increasing symptoms of dyspnea and pleuritic pain, I elected  to readmit her for further evaluation.  She denies any myalgias,  arthralgias, or history of rheumatologic disease.   PAST MEDICAL HISTORY:  1.  Hypertension.  2.  Diabetes.  3.  Hyperlipidemia.  4.  She is status post cholecystectomy, hysterectomy, appendectomy,  hand      surgery.  5.  History of GERD but had a normal upper endoscopy September 2002 per      [facility] record.  6.  PSVT with rapid atrial fibrillation documented during recent      hospitalization.   ALLERGIES:  VALIUM, PREDNISONE, CARDIZEM, TEQUIN, LEVAQUIN, all with  nonspecific reactions.   MEDICATIONS:  1.  Glyburide/metformin 5/500 two b.i.d.  2.  Avandia 8 mg q.a.m.  3.  Furosemide 40 mg q.a.m.  4.  Lotrel 10/20 one q.a.m.  5.  Toprol XL 125 mg daily.  6.  Lescol 80 mg daily.  7.  Amitriptyline 20 mg q.h.s.  8.  Protonix 40 mg daily.  9.  Aspirin 81 mg daily.  10. Iron 1 tablet daily.  11. Actonel 35 mg every week.  12. She also uses p.r.n. Claritin and Alprazolam as well as Nasacort.   SOCIAL HISTORY:  She has never smoked.  She is a retired Agricultural engineer.   FAMILY HISTORY:  Positive for cancer in both mother and father.  Negative  for respiratory disease or premature heart disease.   REVIEW OF SYSTEMS:  Taken in detail and  essentially negative except as  outlined above.   PHYSICAL EXAMINATION:  GENERAL:  This is an elderly white female who appears  chronically ill but not acutely ill with poor skin color which suggests mild  icterus.  HEENT:  Remarkable for oropharynx clear, dentition intact.  NECK:  Supple without cervical adenopathy or tenderness.  Trachea midline.  LUNGS:  Lung fields reveal diminished breath sounds at the left base.  Overall air movement, however, is adequate with no bronchial changes.  CARDIAC:  Regular rate and rhythm without murmur, gallop, or rub present.  No increase in P2.  ABDOMEN:  Soft, benign, with no palpable organomegaly, mass, or tenderness.  EXTREMITIES:  Warm without calf tenderness, cyanosis, clubbing, or edema.  NEUROLOGIC:  No focal deficits or pathologic signs.  SKIN:  Normal except for what may be mild icterus.   LABORATORY DATA AND OTHER STUDIES:  Chest x-ray shows new left pleural  effusion with minimal blunting of the  right costophrenic angle.   The remainder of lab data is pending.   IMPRESSION:  1.  New onset left pleural effusion in a patient who has just recovered from      a right pleural effusion that was exudative in nature with nonspecific      features.  Presumably this was parapneumonic because she had a cough and      a viral-like syndrome prior to the onset of the right-sided pleural      effusion that occurred in the setting of having fallen and fractured      several ribs.  (Note, however, there was no evidence of hemothorax on      workup.)  Therefore, the rib fractures may be a red herring and have      nothing to do with the effusions that are occurring.  Now that the      effusions are bilateral, this brings up a broader differential which      includes occult congestive heart failure and rheumatologic disorders as      well as perhaps a mechanism like recurrent aspiration syndrome that      might cause bilateral recurrent pneumonia with a parapneumonic process.   To sort all this out, I recommended re-hospitalization and will initially  consider a left lateral decubitus x-ray and then proceed with thoracentesis  on this basis.  I am going to withhold antibiotics until we sort out whether  or not there is definite evidence of infection at this point.       MBW/MEDQ  D:  03/18/2005  T:  03/18/2005  Job:  [id number]   cc:   Corwin Levins, M.D. [facility]

## 2011-01-17 NOTE — H&P (Signed)
Lindsay Sheppard, Lindsay Sheppard                ACCOUNT NO.:  [id number]   MEDICAL RECORD NO.:  [id number]          PATIENT TYPE:  ORB   LOCATION:  NA                           FACILITY:  [facility]   PHYSICIAN:  Rene Paci, M.D. LHCDATE OF BIRTH:  01-30-1921   DATE OF ADMISSION:  DATE OF DISCHARGE:                                HISTORY & PHYSICAL   DATE OF ADMISSION:  July 01, 2005   CHIEF COMPLAINT:  No complaints at this time.   HISTORY OF PRESENT ILLNESS:  The patient is an 77 year old white female who  was admitted on June 18, 2005, secondary to an aspiration pneumonia which  resulted from an accidental opiate overdose. The patient was admitted for IV  antibiotics. Secondary to debilitation, the patient will need a stay within  the [facility] at [facility] prior to discharge to home.   ALLERGIES:  1.  LEVAQUIN.  2.  VALIUM.  3.  CARDIZEM.  4.  TEQUIN.  5.  PREDNISONE.  6.  LYRICA.   PAST MEDICAL HISTORY:  1.  Status post hospitalization September 2006 with a left chest tube      placement secondary to aspiration pneumonia with effusion per Dr. Sherene Sires.  2.  Status post hospitalization July 2006 with a right chest tube placement      secondary to aspiration pneumonia with effusion.  3.  Paroxysmal atrial fibrillation.  4.  Iron deficiency anemia.  5.  Hypertension.  6.  Diabetes type 2.  7.  Depression.  8.  History of PE July 2006.  9.  Arrhythmia with ablation greater than 10 years ago.  10. Status post cataract surgery.  11. Status post carpal tunnel release.  12. Status post total abdominal hysterectomy.  13. Status post appendectomy.   SOCIAL HISTORY:  The patient lives with her daughter Kendal Hymen, has worked as a  Visual merchandiser in the past.   FAMILY HISTORY:  Noncontributory.   MEDICATIONS AT DISCHARGE:  1.  Protonix 40 mg p.o. daily.  2.  Zebeta 5 mg p.o. daily.  3.  Zocor 40 mg p.o. q.h.s.  4.  Plendil 10 mg p.o. daily.  5.  Avapro 300 mg p.o. daily.  6.  Ferrous  sulfate 325 mg p.o. b.i.d.  7.  Atrovent nebulizers q.8h.  8.  Xopenex nebulizers 1.25 mg q.8h.  9.  Lexapro 10 mg p.o. daily.  10. Nystatin ointment b.i.d. to affected area.  11. Colace 100 mg p.o. b.i.d.  12. Catapres 0.2 mg p.o. b.i.d.  13. Neurontin 300 mg p.o. t.i.d.  14. Lasix 40 mg p.o. daily.  15. Coumadin daily per pharmacy protocol.  16. Lantus insulin 10 units subcu q.h.s.  17. Ensure Plus one can p.o. b.i.d.   PHYSICAL EXAMINATION:  VITAL SIGNS:  Blood pressure 136/62, heart rate 74,  respiratory rate 18, temperature 98.3, O2 is 99% on 2 L.  GENERAL:  The patient is awake, alert, no acute distress.  CARDIOVASCULAR:  S1, S2, regular rate and rhythm.  LUNGS:  Clear to auscultation bilaterally. No wheezes, rales, or rhonchi.  ABDOMEN:  Soft, nontender, nondistended.  EXTREMITIES:  Trace lower extremity edema.  NEUROLOGIC:  Awake, alert, moving all extremities.   ASSESSMENT AND PLAN:  1.  Multifactorial respiratory failure with recurrent aspiration pneumonia      following accidental opiate overdose status post 14 days of vancomycin      and Zosyn intravenously.  2.  Hypernatremia/hypokalemia, resolved.  3.  History of pulmonary embolus; continue Coumadin per pharmacy protocol.  4.  Anemia. The patient remains hemodynamically stable with a negative fecal      occult blood; continue p.o. iron.  5.  Post herpetic neuralgia, improved with Neurontin; continue at current      dosing.  6.  Hypertension. Blood pressure remains stable.  7.  Diabetes type 2. CBGs remain stable under 200. Continue h.s. Lantus.  8.  Paroxysmal atrial fibrillation. The patient remains rate controlled.      Continue Coumadin per pharmacy protocol.  9.  Status post nonsustained ventricular tachycardia on June 26, 2005,      with the most recent echocardiogram revealing 55-65% left ventricular      ejection fraction; no further arrhythmias noted. Will monitor.  10. Gastroesophageal reflux disease.  Stable on proton pump inhibitor.  11. Debilitation. Plan to transfer the patient to SACU prior to discharge to      home. We will continue to follow the patient in SACU.      Melissa S. Peggyann Juba, NP      Rene Paci, M.D. [facility]  Electronically Signed    MSO/MEDQ  D:  07/01/2005  T:  07/01/2005  Job:  [id number]

## 2011-01-17 NOTE — Discharge Summary (Signed)
Lindsay Sheppard, Lindsay Sheppard                ACCOUNT NO.:  [id number]   MEDICAL RECORD NO.:  [id number]          PATIENT TYPE:  INP   LOCATION:  3702                         FACILITY:  [facility]   PHYSICIAN:  Casimiro Needle B. Sherene Sires, M.D. [facility] OF BIRTH:  [birth year]   DATE OF ADMISSION:  03/06/2005  DATE OF DISCHARGE:  03/10/2005                                 DISCHARGE SUMMARY   DISCHARGE DIAGNOSES:  1.  Dyspnea secondary to right peripneumonic effusion.  2.  Atrial fibrillation with rapid ventricular response.   LABORATORY DATA:  March 10, 2005; sodium 137, potassium 3.6, chloride 106,  CO2 25, BUN 12, creatinine 0.9, glucose 188, hemoglobin 10.3, white blood  cell count 4.8, platelets 286.  Cardiac enzymes negative.   RADIOLOGY:  March 07, 2005, CT of chest; small right basilar pneumothorax with  a right chest tube present.  Parenchymal opacity in right lower lobe and to  a lesser degree right middle lobe and inferior right lower lobe.  Although  some of this may be due to contusion, pneumonia as with aspiration is a  consideration, incidental left adrenal myelolipoma.  At least two right  lower anterolateral rib fractures.   March 06, 2005, showing chest tube in place without pneumothorax.  Right  effusion slightly increased from x-ray on February 24, 2005.   March 09, 2005, mild subcutaneous emphysema still noted.  Right thoracostomy  tube removed. Marked improvement in aeration on right side.   BRIEF HISTORY:  This is an 78 year old white female who was seen on June 29,  with new onset dyspnea associated with a chest wall injury that occurred  approximately 3 weeks prior to admission after a fall.  In retrospect, the  family told Dr. Sherene Sires that for the first time that she was not actually  feeling good at the time and she fell and had been suffering from a viral  upper respiratory chest infection.  She had been seen on June 5, for  symptoms of cough for 3 days.  She had no fever then nor pleuritic  chest  pain and was treated with Cefepime.  She fell about 3 weeks prior to  admission with multiple rib fractures on the right.  She underwent a chest x-  ray which showed a large right pleural effusion that Dr. Sherene Sires assumed was a  hemothorax.  Underwent a thoracentesis on June 29, which was exudative with  reactive cytologies, but no evidence of malignancy.  She reported to the  office on March 06, 2005, with no improvement in dyspnea and therefore was  admitted for chest tube insertion for drainage.  Chest tube was inserted on  March 06, 2005, for increased shortness of breath.   HOSPITAL COURSE:  Problem 1.  Dyspnea secondary to right peripneumonic  effusion.  The patient had chest tube inserted on date of admission on March 06, 2005.  There has been marked increase in aeration and decreased right-  sided effusion since chest tube insertion.  Follow-up chest tube was removed  on March 09, 2005.  Follow-up chest x-rays continued to demonstrate improved  aeration.  There is some small subcutaneous emphysema, but no pneumothorax  on x-ray.   Problem 2.  Atrial fibrillation with RVR.  The patient did have documented  rapid atrial fibrillation with rapid ventricular response.  She did receive  one dose of IV Digoxin during her hospitalization, but no other changes in  her regimen was required.  Follow-up with Duke Salvia, M.D. has been  established for outpatient follow-up of atrial fibrillation.  Currently she  is in normal sinus rhythm with frequent PAC's.   DIET:  Per regular routine.   ACTIVITY:  Slowly increase.  She has been instructed for sponge baths and to  keep current dressing on for the next 72 hours and then may change daily as  needed with sterile occlusive dressing.   DISCHARGE MEDICATIONS:  1.  Aspirin 81 mg daily.  2.  Lescol XL 80 mg tablet one daily.  3.  Amitriptyline 25 mg tablet before bed.  4.  Avandia 8 mg tablet daily.  5.  Toprol XL 100 mg tablet daily.  6.   Lotrel 10/20 mg tablet daily.  7.  Lasix 40 mg tablet daily.  8.  Protonix 40 mg tablet daily.  9.  Iron one tablet daily.  10. Glyburide/Metformin 5/500 twice a day.   FOLLOW UP:  Dr. Sherene Sires on Tuesday, July 18, at 10 a.m. and then Thursday, July  13, at 10:15 with Dr. Graciela Husbands.      [name]   PB/MEDQ  D:  03/10/2005  T:  03/10/2005  Job:  [id number]   cc:   Duke Salvia, M.D.

## 2011-01-17 NOTE — Discharge Summary (Signed)
Lindsay Sheppard, Lindsay Sheppard                ACCOUNT NO.:  [id number]   MEDICAL RECORD NO.:  [id number]          PATIENT TYPE:  INP   LOCATION:  3730                         FACILITY:  [facility]   PHYSICIAN:  Casimiro Needle B. Sherene Sires, M.D. [facility] OF BIRTH:  [birth year]   DATE OF ADMISSION:  05/11/2005  DATE OF DISCHARGE:  05/30/2005                                 DISCHARGE SUMMARY   FINAL DIAGNOSES:  1.  Respiratory distress secondary to probable aspiration pneumonia.      1.  Positive aspiration by speech therapy evaluation May 20, 2005.      2.  Bilateral parapneumonic effusions secondary to probable aspiration.      3.  Status post Primaxin therapy from September 18-28, 2006.  2.  Bilateral pleural effusions, status post right chest tube placement July      2006.      1.  Status post left chest tube placement September 22-26 with this          admission.      2.  Poor candidate for video-assisted thoracoscopic surgery, plan follow-          up as an outpatient.  3.  Paroxysmal atrial fibrillation, on amiodarone from July 2006 through      May 18, 2005, with sedimentation rate 68 on May 18, 2005,      and no recurrent atrial arrhythmias on Zebeta.  4.  Iron-deficiency anemia with iron saturation of 9% on May 13, 2005.      1.  Status post esophagogastroduodenoscopy with positive stricture on          September 14.  5.  Hypertension.  6.  Diabetes, now Lantus-dependent.  7.  Depression and debilitation, started on Lexapro this admission.  8.  Herpes zoster, right C2 dermatome, with possible ocular involvement.      1.  Valtrex started September 28 for seven days at 1000 mg b.i.d.      2.  Ophthalmology evaluation scheduled for September 29.  9.  Pulmonary embolism documented July 2006, on Coumadin per Coumadin      clinic.   HISTORY:  Please see dictated H&P.  This patient was admitted in respiratory  distress on September 10 with evidence of worsening left pleural effusion  that was initially felt to be secondary to pulmonary embolus and that  occurred in July 2006 but had worsened at home.  A chest tube was placed on  September 18 revealing nonspecific exudative fluid.  A speech therapy  evaluation revealed aspiration as a likely mechanism for repeat  pneumonia/aspiration injury/pleural effusions, and she seemed to do better  after a 10-day course of Primaxin that was completed on September 28 and  using the speech therapy recommendations regarding modifying diet.  Her  daughter was detailed on dietary restrictions and the patient was anxious to  go home, so we maximized home health care and discharged her on the 29th.   The day prior to discharge the patient complained of right eye pain and had  redness and swelling of the right eye.  Later during the day she  developed a  typical herpes zoster rash involving the right face over the C2 dermatome  and was started on Valtrex with a recommended dose of 1000 mg b.i.d. by  pharmacy.  Will arrange for her to be seen by ophthalmology on the day of  discharge for further care of the eye.   LABORATORY DATA:  INR was 3.4 prior to discharge.  White count was 6700.  She had bilateral pleural effusions, right greater than left, on last chest  x-ray dated September 28, and an iron saturation of 9% noted.  Hematocrit of  35% prior to discharge with an MCV of 84 with saturations 98% on room air.   At this time the patient's condition is improved but her overall prognosis  is very guarded with multiple geriatric complications and ultimately may  require nursing home placement and/or PEG feeding if the patient and her  family want [place] to treat her multiple problems aggressively.  However, at  this point she is felt to be at maximal hospital benefit and will be  discharged on the following medicines:   1.  Protonix 40 mg before breakfast daily.  2.  Iron sulfate 325 mg one b.i.d.  3.  Amaryl 4 mg one q.a.m.  4.  Lantus 10  units q.p.m. (hold if blood sugar less than 60 either before      breakfast or before supper).  5.  Clonidine 0.2 mg b.i.d.  6.  Plendil 10 mg daily.  7.  Avapro 150 mg tablets two daily.  8.  Lescol 80 mg one daily.  9.  Coumadin per Coumadin clinic.  10. Valtrex 1000 mg b.i.d. for seven days.  11. Lexapro 20 mg daily.  12. Zebeta 5 mg daily.  13. Tramadol 50 mg one q.4h. p.r.n. pain.   Her diet is to be low-carbohydrate with chin tuck with all liquids at full  supervision with p.o.'s (see last speech therapy note dated September 26  regarding specifics).  Plan to follow up with a home health speech  therapist.           ______________________________  [name]. Sherene Sires, M.D. [facility]     MBW/MEDQ  D:  05/30/2005  T:  05/30/2005  Job:  [id number]   cc:   Corwin Levins, M.D. [facility]  [address]  [place]  [place] [address]

## 2011-01-28 ENCOUNTER — Other Ambulatory Visit: Payer: Self-pay

## 2011-01-28 ENCOUNTER — Ambulatory Visit (INDEPENDENT_AMBULATORY_CARE_PROVIDER_SITE_OTHER): Payer: Medicare Other

## 2011-01-28 DIAGNOSIS — E538 Deficiency of other specified B group vitamins: Secondary | ICD-10-CM

## 2011-01-28 MED ORDER — FUROSEMIDE 20 MG PO TABS: 20.0000 mg | ORAL_TABLET | Freq: Every day | ORAL | Status: DC

## 2011-01-28 MED ORDER — METFORMIN HCL 500 MG PO TABS: 500.0000 mg | ORAL_TABLET | Freq: Two times a day (BID) | ORAL | Status: DC

## 2011-01-28 MED ORDER — PRAVASTATIN SODIUM 40 MG PO TABS: 20.0000 mg | ORAL_TABLET | Freq: Every day | ORAL | Status: DC

## 2011-01-28 MED ORDER — PANTOPRAZOLE SODIUM 40 MG PO TBEC: 40.0000 mg | DELAYED_RELEASE_TABLET | Freq: Every day | ORAL | Status: DC

## 2011-01-28 MED ORDER — SULFAMETHOXAZOLE-TRIMETHOPRIM 800-160 MG PO TABS: 1.0000 | ORAL_TABLET | Freq: Every day | ORAL | Status: DC

## 2011-01-28 MED ORDER — LABETALOL HCL 200 MG PO TABS: 400.0000 mg | ORAL_TABLET | Freq: Two times a day (BID) | ORAL | Status: DC

## 2011-01-28 MED ORDER — CYANOCOBALAMIN 1000 MCG/ML IJ SOLN
1000.0000 ug | Freq: Once | INTRAMUSCULAR | Status: AC
  Administered 2011-01-28: 1000 ug via INTRAMUSCULAR

## 2011-01-28 MED ORDER — GABAPENTIN 300 MG PO CAPS: 300.0000 mg | ORAL_CAPSULE | ORAL | Status: DC

## 2011-01-28 MED ORDER — GLIMEPIRIDE 4 MG PO TABS: 4.0000 mg | ORAL_TABLET | Freq: Every day | ORAL | Status: DC

## 2011-02-04 ENCOUNTER — Telehealth: Payer: Self-pay

## 2011-02-04 MED ORDER — GABAPENTIN 300 MG PO CAPS: 300.0000 mg | ORAL_CAPSULE | Freq: Every day | ORAL | Status: DC

## 2011-02-04 NOTE — Telephone Encounter (Signed)
Pt's daughter called stating [facility] received Rx for Gabapentin 300 mg 1 tab every other day but she should be taking medication daily at bedtime. Daughter called requesting prescription be updated and re-sent to mail order pharmacy.

## 2011-02-04 NOTE — Telephone Encounter (Signed)
Done hardcopy to dahlia/LIM B  

## 2011-02-04 NOTE — Telephone Encounter (Signed)
Daughter advise, rx faxed to pharmacy

## 2011-02-10 ENCOUNTER — Ambulatory Visit (INDEPENDENT_AMBULATORY_CARE_PROVIDER_SITE_OTHER): Payer: Medicare Other | Admitting: *Deleted

## 2011-02-10 DIAGNOSIS — I4891 Unspecified atrial fibrillation: Secondary | ICD-10-CM

## 2011-02-10 DIAGNOSIS — I2699 Other pulmonary embolism without acute cor pulmonale: Secondary | ICD-10-CM

## 2011-02-10 DIAGNOSIS — Z86718 Personal history of other venous thrombosis and embolism: Secondary | ICD-10-CM

## 2011-02-10 LAB — POCT INR: INR: 2.7

## 2011-02-18 ENCOUNTER — Emergency Department (HOSPITAL_COMMUNITY)
Admission: EM | Admit: 2011-02-18 | Discharge: 2011-02-19 | Disposition: A | Discharge status: Home / Self Care | Payer: Medicare Other | Attending: Emergency Medicine | Admitting: Emergency Medicine

## 2011-02-18 DIAGNOSIS — R791 Abnormal coagulation profile: Secondary | ICD-10-CM | POA: Insufficient documentation

## 2011-02-18 DIAGNOSIS — Z7901 Long term (current) use of anticoagulants: Secondary | ICD-10-CM | POA: Insufficient documentation

## 2011-02-18 DIAGNOSIS — I4891 Unspecified atrial fibrillation: Secondary | ICD-10-CM | POA: Insufficient documentation

## 2011-02-18 DIAGNOSIS — F329 Major depressive disorder, single episode, unspecified: Secondary | ICD-10-CM | POA: Insufficient documentation

## 2011-02-18 DIAGNOSIS — I10 Essential (primary) hypertension: Secondary | ICD-10-CM | POA: Insufficient documentation

## 2011-02-18 DIAGNOSIS — F3289 Other specified depressive episodes: Secondary | ICD-10-CM | POA: Insufficient documentation

## 2011-02-18 DIAGNOSIS — Z79899 Other long term (current) drug therapy: Secondary | ICD-10-CM | POA: Insufficient documentation

## 2011-02-18 DIAGNOSIS — I252 Old myocardial infarction: Secondary | ICD-10-CM | POA: Insufficient documentation

## 2011-02-18 DIAGNOSIS — T45515A Adverse effect of anticoagulants, initial encounter: Secondary | ICD-10-CM | POA: Insufficient documentation

## 2011-02-18 DIAGNOSIS — E119 Type 2 diabetes mellitus without complications: Secondary | ICD-10-CM | POA: Insufficient documentation

## 2011-02-18 DIAGNOSIS — E7800 Pure hypercholesterolemia, unspecified: Secondary | ICD-10-CM | POA: Insufficient documentation

## 2011-02-18 DIAGNOSIS — K219 Gastro-esophageal reflux disease without esophagitis: Secondary | ICD-10-CM | POA: Insufficient documentation

## 2011-02-19 LAB — URINALYSIS, ROUTINE W REFLEX MICROSCOPIC
Bilirubin Urine: NEGATIVE
Ketones, ur: NEGATIVE mg/dL
Leukocytes, UA: NEGATIVE
Nitrite: NEGATIVE
Specific Gravity, Urine: 1.013 (ref 1.005–1.030)
Urobilinogen, UA: 0.2 mg/dL (ref 0.0–1.0)

## 2011-02-19 LAB — POCT I-STAT, CHEM 8
Creatinine, Ser: 1.4 mg/dL — ABNORMAL HIGH (ref 0.50–1.10)
Hemoglobin: 11.2 g/dL — ABNORMAL LOW (ref 12.0–15.0)
Sodium: 139 mEq/L (ref 135–145)
TCO2: 19 mmol/L (ref 0–100)

## 2011-02-27 ENCOUNTER — Telehealth: Payer: Self-pay | Admitting: *Deleted

## 2011-02-27 NOTE — Telephone Encounter (Signed)
Rec benefit summary for Prolia. She is due for subsequent inj 03-28-11. Pt's OOP is $0. I informed her daughter Kendal Hymen and transferred to scheduler to arrange nurse visit. Will have med ordered.

## 2011-03-04 ENCOUNTER — Ambulatory Visit (INDEPENDENT_AMBULATORY_CARE_PROVIDER_SITE_OTHER): Payer: Medicare Other

## 2011-03-04 DIAGNOSIS — E538 Deficiency of other specified B group vitamins: Secondary | ICD-10-CM

## 2011-03-04 MED ORDER — CYANOCOBALAMIN 1000 MCG/ML IJ SOLN
1000.0000 ug | Freq: Once | INTRAMUSCULAR | Status: AC
  Administered 2011-03-04: 1000 ug via INTRAMUSCULAR

## 2011-03-10 ENCOUNTER — Encounter: Payer: Medicare Other | Admitting: *Deleted

## 2011-03-12 ENCOUNTER — Ambulatory Visit (INDEPENDENT_AMBULATORY_CARE_PROVIDER_SITE_OTHER): Payer: Medicare Other | Admitting: *Deleted

## 2011-03-12 DIAGNOSIS — I4891 Unspecified atrial fibrillation: Secondary | ICD-10-CM

## 2011-03-12 DIAGNOSIS — I2699 Other pulmonary embolism without acute cor pulmonale: Secondary | ICD-10-CM

## 2011-03-12 DIAGNOSIS — Z86718 Personal history of other venous thrombosis and embolism: Secondary | ICD-10-CM

## 2011-03-26 ENCOUNTER — Ambulatory Visit (INDEPENDENT_AMBULATORY_CARE_PROVIDER_SITE_OTHER): Payer: Medicare Other | Admitting: *Deleted

## 2011-03-26 DIAGNOSIS — I2699 Other pulmonary embolism without acute cor pulmonale: Secondary | ICD-10-CM

## 2011-03-26 DIAGNOSIS — Z86718 Personal history of other venous thrombosis and embolism: Secondary | ICD-10-CM

## 2011-03-26 DIAGNOSIS — I4891 Unspecified atrial fibrillation: Secondary | ICD-10-CM

## 2011-04-01 ENCOUNTER — Ambulatory Visit (INDEPENDENT_AMBULATORY_CARE_PROVIDER_SITE_OTHER): Payer: Medicare Other | Admitting: *Deleted

## 2011-04-01 DIAGNOSIS — M810 Age-related osteoporosis without current pathological fracture: Secondary | ICD-10-CM

## 2011-04-01 DIAGNOSIS — E538 Deficiency of other specified B group vitamins: Secondary | ICD-10-CM

## 2011-04-01 MED ORDER — DENOSUMAB 60 MG/ML ~~LOC~~ SOLN
60.0000 mg | Freq: Once | SUBCUTANEOUS | Status: AC
  Administered 2011-04-01: 60 mg via SUBCUTANEOUS

## 2011-04-01 MED ORDER — CYANOCOBALAMIN 1000 MCG/ML IJ SOLN
1000.0000 ug | Freq: Once | INTRAMUSCULAR | Status: AC
  Administered 2011-04-01: 1000 ug via INTRAMUSCULAR

## 2011-04-08 ENCOUNTER — Ambulatory Visit (INDEPENDENT_AMBULATORY_CARE_PROVIDER_SITE_OTHER): Payer: Medicare Other | Admitting: *Deleted

## 2011-04-08 DIAGNOSIS — I4891 Unspecified atrial fibrillation: Secondary | ICD-10-CM

## 2011-04-08 DIAGNOSIS — I2699 Other pulmonary embolism without acute cor pulmonale: Secondary | ICD-10-CM

## 2011-04-08 DIAGNOSIS — Z86718 Personal history of other venous thrombosis and embolism: Secondary | ICD-10-CM

## 2011-04-30 ENCOUNTER — Ambulatory Visit (INDEPENDENT_AMBULATORY_CARE_PROVIDER_SITE_OTHER): Payer: Medicare Other | Admitting: *Deleted

## 2011-04-30 DIAGNOSIS — I2699 Other pulmonary embolism without acute cor pulmonale: Secondary | ICD-10-CM

## 2011-04-30 DIAGNOSIS — Z86718 Personal history of other venous thrombosis and embolism: Secondary | ICD-10-CM

## 2011-04-30 DIAGNOSIS — I4891 Unspecified atrial fibrillation: Secondary | ICD-10-CM

## 2011-04-30 LAB — POCT INR: INR: 2.4

## 2011-05-12 ENCOUNTER — Ambulatory Visit (INDEPENDENT_AMBULATORY_CARE_PROVIDER_SITE_OTHER): Payer: Medicare Other

## 2011-05-12 DIAGNOSIS — E538 Deficiency of other specified B group vitamins: Secondary | ICD-10-CM

## 2011-05-12 MED ORDER — CYANOCOBALAMIN 1000 MCG/ML IJ SOLN
1000.0000 ug | Freq: Once | INTRAMUSCULAR | Status: AC
  Administered 2011-05-12: 1000 ug via INTRAMUSCULAR

## 2011-05-26 LAB — POCT URINALYSIS DIP (DEVICE)
Bilirubin Urine: NEGATIVE
Ketones, ur: NEGATIVE
Operator id: 239701
Specific Gravity, Urine: 1.01

## 2011-05-28 ENCOUNTER — Encounter: Payer: Medicare Other | Admitting: *Deleted

## 2011-05-28 LAB — URINALYSIS, ROUTINE W REFLEX MICROSCOPIC
Bilirubin Urine: NEGATIVE
Glucose, UA: 250 — AB
Ketones, ur: NEGATIVE
Leukocytes, UA: NEGATIVE
pH: 7

## 2011-05-28 LAB — CARDIAC PANEL(CRET KIN+CKTOT+MB+TROPI)
CK, MB: 3.2
Relative Index: INVALID
Relative Index: INVALID
Total CK: 26
Total CK: 33
Troponin I: 0.12 — ABNORMAL HIGH

## 2011-05-28 LAB — LIPID PANEL
Cholesterol: 109
LDL Cholesterol: 67

## 2011-05-28 LAB — APTT: aPTT: 30

## 2011-05-28 LAB — CBC
HCT: 41.8
Hemoglobin: 14.1
MCV: 88.9
RBC: 4.7
WBC: 6.6

## 2011-05-28 LAB — CK TOTAL AND CKMB (NOT AT ARMC): Relative Index: INVALID

## 2011-05-28 LAB — URINE MICROSCOPIC-ADD ON

## 2011-05-28 LAB — HEMOGLOBIN A1C
Hgb A1c MFr Bld: 7.3 — ABNORMAL HIGH
Mean Plasma Glucose: 183

## 2011-05-28 LAB — BASIC METABOLIC PANEL
Chloride: 107
GFR calc Af Amer: >60
Potassium: 4.1
Sodium: 141

## 2011-05-28 LAB — PROTIME-INR: Prothrombin Time: 25.2 — ABNORMAL HIGH

## 2011-05-28 LAB — URINE CULTURE

## 2011-05-29 ENCOUNTER — Ambulatory Visit (INDEPENDENT_AMBULATORY_CARE_PROVIDER_SITE_OTHER): Payer: Medicare Other | Admitting: *Deleted

## 2011-05-29 DIAGNOSIS — I2699 Other pulmonary embolism without acute cor pulmonale: Secondary | ICD-10-CM

## 2011-05-29 DIAGNOSIS — I4891 Unspecified atrial fibrillation: Secondary | ICD-10-CM

## 2011-05-29 DIAGNOSIS — Z86718 Personal history of other venous thrombosis and embolism: Secondary | ICD-10-CM

## 2011-05-29 LAB — URINALYSIS, ROUTINE W REFLEX MICROSCOPIC
Ketones, ur: NEGATIVE
Nitrite: POSITIVE — AB
Protein, ur: 100 — AB
Urobilinogen, UA: 0.2
pH: 7

## 2011-05-29 LAB — CBC
HCT: 39
MCHC: 34
Platelets: 184
RBC: 5.07
RDW: 14.3
WBC: 5.4
WBC: 5.5

## 2011-05-29 LAB — DIFFERENTIAL
Basophils Absolute: 0
Basophils Relative: 0
Eosinophils Relative: 2
Lymphocytes Relative: 25
Lymphs Abs: 1.4
Monocytes Relative: 7
Neutro Abs: 3.3
Neutro Abs: 4.3
Neutrophils Relative %: 61
Neutrophils Relative %: 80 — ABNORMAL HIGH

## 2011-05-29 LAB — HEMOGLOBIN A1C
Hgb A1c MFr Bld: 7.7 — ABNORMAL HIGH
Mean Plasma Glucose: 197

## 2011-05-29 LAB — CATECHOLAMINES, FRACTIONATED, URINE, 24 HOUR
Catecholamines T: 31 ug/24hr (ref <100)
Epinephrine 24 Hr Urine: 7 ug/24hr (ref <20)
Time-CATEUR: 24
Total urine volume: 1850 mL

## 2011-05-29 LAB — TSH: TSH: 0.571

## 2011-05-29 LAB — POCT I-STAT, CHEM 8
BUN: 19
Calcium, Ion: 1.11 — ABNORMAL LOW
Chloride: 108
Creatinine, Ser: 0.9
Glucose, Bld: 185 — ABNORMAL HIGH

## 2011-05-29 LAB — COMPREHENSIVE METABOLIC PANEL
AST: 24
Albumin: 3.9
Alkaline Phosphatase: 61
Chloride: 104
GFR calc Af Amer: >60
Potassium: 3.8
Sodium: 137
Total Bilirubin: 0.9
Total Protein: 6.5

## 2011-05-29 LAB — METANEPHRINES, URINE, 24 HOUR
Metanephrines, Ur: 153 mcg/24 h (ref 90–315)
Normetanephrine, 24H Ur: 349 mcg/24 h (ref 122–676)
Volume, Urine-METAN: 1850

## 2011-05-29 LAB — PROTIME-INR
INR: 2.5 — ABNORMAL HIGH
INR: 1.6 — ABNORMAL HIGH
INR: 1.7 — ABNORMAL HIGH
Prothrombin Time: 27.5 — ABNORMAL HIGH
Prothrombin Time: 20.4 — ABNORMAL HIGH

## 2011-05-29 LAB — BASIC METABOLIC PANEL
BUN: 13
Calcium: 9.2
Chloride: 109
GFR calc Af Amer: 60 — ABNORMAL LOW
GFR calc non Af Amer: 52 — ABNORMAL LOW
Glucose, Bld: 100 — ABNORMAL HIGH
Potassium: 3.6
Potassium: 3.4 — ABNORMAL LOW

## 2011-05-29 LAB — POCT CARDIAC MARKERS: Troponin i, poc: <0.05

## 2011-05-29 LAB — POCT INR: INR: 2.4

## 2011-05-29 LAB — CREATININE, URINE, 24 HOUR: Creatinine, 24H Ur: 703

## 2011-05-29 LAB — URINE MICROSCOPIC-ADD ON

## 2011-05-30 LAB — URINE CULTURE: Culture: NO GROWTH

## 2011-05-30 LAB — PROTIME-INR
INR: 2.1 — ABNORMAL HIGH
INR: 2.2 — ABNORMAL HIGH
INR: 2.4 — ABNORMAL HIGH
INR: 2.4 — ABNORMAL HIGH
INR: 2.6 — ABNORMAL HIGH
Prothrombin Time: 25.2 — ABNORMAL HIGH
Prothrombin Time: 25.9 — ABNORMAL HIGH
Prothrombin Time: 29.7 — ABNORMAL HIGH
Prothrombin Time: 29.7 — ABNORMAL HIGH
Prothrombin Time: 32.5 — ABNORMAL HIGH

## 2011-05-30 LAB — ALDOSTERONE + RENIN ACTIVITY W/ RATIO
Aldosterone, Serum: 6 ng/dL
Renin Activity: 0.2 ng/mL/h

## 2011-05-30 LAB — BASIC METABOLIC PANEL
BUN: 11
CO2: 23
CO2: 26
CO2: 27
Calcium: 8.7
Calcium: 8.9
Calcium: 9.1
Creatinine, Ser: 0.83
Creatinine, Ser: 0.86
GFR calc Af Amer: >60
GFR calc Af Amer: >60
GFR calc non Af Amer: >60
GFR calc non Af Amer: >60
Glucose, Bld: 72
Glucose, Bld: 77
Potassium: 3 — ABNORMAL LOW
Sodium: 137
Sodium: 142
Sodium: 144

## 2011-05-30 LAB — 5 HIAA, QUANTITATIVE, URINE, 24 HOUR

## 2011-06-03 ENCOUNTER — Ambulatory Visit (INDEPENDENT_AMBULATORY_CARE_PROVIDER_SITE_OTHER): Payer: Medicare Other | Admitting: Internal Medicine

## 2011-06-03 ENCOUNTER — Other Ambulatory Visit (INDEPENDENT_AMBULATORY_CARE_PROVIDER_SITE_OTHER): Payer: Medicare Other

## 2011-06-03 ENCOUNTER — Encounter: Payer: Self-pay | Admitting: Internal Medicine

## 2011-06-03 VITALS — BP 120/48 | HR 81 | Temp 97.9°F | Ht 66.0 in | Wt 139.5 lb

## 2011-06-03 DIAGNOSIS — E538 Deficiency of other specified B group vitamins: Secondary | ICD-10-CM

## 2011-06-03 DIAGNOSIS — E119 Type 2 diabetes mellitus without complications: Secondary | ICD-10-CM

## 2011-06-03 DIAGNOSIS — R5381 Other malaise: Secondary | ICD-10-CM

## 2011-06-03 DIAGNOSIS — R5383 Other fatigue: Secondary | ICD-10-CM

## 2011-06-03 DIAGNOSIS — H1030 Unspecified acute conjunctivitis, unspecified eye: Secondary | ICD-10-CM

## 2011-06-03 DIAGNOSIS — Z23 Encounter for immunization: Secondary | ICD-10-CM

## 2011-06-03 DIAGNOSIS — E785 Hyperlipidemia, unspecified: Secondary | ICD-10-CM

## 2011-06-03 LAB — LIPID PANEL
HDL: 43.7 mg/dL (ref >39.00)
Total CHOL/HDL Ratio: 3
VLDL: 15.2 mg/dL (ref 0.0–40.0)

## 2011-06-03 LAB — CBC WITH DIFFERENTIAL/PLATELET
Basophils Absolute: 0 10*3/uL (ref 0.0–0.1)
Eosinophils Relative: 2.4 % (ref 0.0–5.0)
HCT: 34.4 % — ABNORMAL LOW (ref 36.0–46.0)
Lymphs Abs: 0.9 10*3/uL (ref 0.7–4.0)
Monocytes Relative: 7.7 % (ref 3.0–12.0)
Neutrophils Relative %: 70.7 % (ref 43.0–77.0)
Platelets: 170 10*3/uL (ref 150.0–400.0)
RDW: 15.1 % — ABNORMAL HIGH (ref 11.5–14.6)
WBC: 4.8 10*3/uL (ref 4.5–10.5)

## 2011-06-03 LAB — MICROALBUMIN / CREATININE URINE RATIO
Creatinine,U: 127.9 mg/dL
Microalb Creat Ratio: 2 mg/g (ref 0.0–30.0)
Microalb, Ur: 2.5 mg/dL — ABNORMAL HIGH (ref 0.0–1.9)

## 2011-06-03 LAB — BASIC METABOLIC PANEL
BUN: 28 mg/dL — ABNORMAL HIGH (ref 6–23)
CO2: 25 mEq/L (ref 19–32)
Calcium: 9.1 mg/dL (ref 8.4–10.5)
Glucose, Bld: 144 mg/dL — ABNORMAL HIGH (ref 70–99)
Potassium: 5 mEq/L (ref 3.5–5.1)
Sodium: 140 mEq/L (ref 135–145)

## 2011-06-03 LAB — TSH: TSH: 3.63 u[IU]/mL (ref 0.35–5.50)

## 2011-06-03 LAB — HEPATIC FUNCTION PANEL
AST: 17 U/L (ref 0–37)
Bilirubin, Direct: 0.1 mg/dL (ref 0.0–0.3)
Total Bilirubin: 0.4 mg/dL (ref 0.3–1.2)

## 2011-06-03 MED ORDER — CYANOCOBALAMIN 1000 MCG/ML IJ SOLN
1000.0000 ug | Freq: Once | INTRAMUSCULAR | Status: AC
  Administered 2011-06-03: 1000 ug via INTRAMUSCULAR

## 2011-06-03 MED ORDER — GLIMEPIRIDE 4 MG PO TABS: 4.0000 mg | ORAL_TABLET | Freq: Every day | ORAL | Status: DC

## 2011-06-03 MED ORDER — CLONIDINE HCL 0.3 MG/24HR TD PTWK: 1.0000 | MEDICATED_PATCH | TRANSDERMAL | Status: DC

## 2011-06-03 MED ORDER — LABETALOL HCL 200 MG PO TABS: 400.0000 mg | ORAL_TABLET | Freq: Two times a day (BID) | ORAL | Status: DC

## 2011-06-03 MED ORDER — SULFAMETHOXAZOLE-TRIMETHOPRIM 800-160 MG PO TABS: 1.0000 | ORAL_TABLET | Freq: Every day | ORAL | Status: DC

## 2011-06-03 MED ORDER — INSULIN GLARGINE 100 UNIT/ML ~~LOC~~ SOLN: SUBCUTANEOUS | Status: DC

## 2011-06-03 MED ORDER — PANTOPRAZOLE SODIUM 40 MG PO TBEC: 40.0000 mg | DELAYED_RELEASE_TABLET | Freq: Every day | ORAL | Status: DC

## 2011-06-03 MED ORDER — PRAVASTATIN SODIUM 40 MG PO TABS: 20.0000 mg | ORAL_TABLET | Freq: Every day | ORAL | Status: DC

## 2011-06-03 MED ORDER — TOBRAMYCIN 0.3 % OP SOLN: 1.0000 [drp] | Freq: Four times a day (QID) | OPHTHALMIC | Status: AC

## 2011-06-03 NOTE — Assessment & Plan Note (Signed)
stable overall by hx and exam, most recent data reviewed with pt, and pt to continue medical treatment as before  Lab Results  Component Value Date   LDLCALC 97 11/29/2010

## 2011-06-03 NOTE — Patient Instructions (Addendum)
You had the flu shot and B12 shots today Take all new medications as prescribed  - the antibiotic eye drops sent to the [facility] Continue all other medications as before Your refills were sent to [facility] as requested Please go to LAB in the Basement for the blood and/or urine tests to be done today Please call the phone number [phone] (the PhoneTree System) for results of testing in 2-3 days;  When calling, simply dial the number, and when prompted enter the MRN number above (the Medical Record Number) and the # key, then the message should start. Please return in 6 months, or sooner if needed

## 2011-06-03 NOTE — Assessment & Plan Note (Signed)
Mild to mod, for antibx course,  to f/u any worsening symptoms or concerns 

## 2011-06-03 NOTE — Assessment & Plan Note (Signed)
stable overall by hx and exam, most recent data reviewed with pt, and pt to continue medical treatment as before  Lab Results  Component Value Date   HGBA1C 6.7* 11/29/2010

## 2011-06-03 NOTE — Progress Notes (Signed)
Subjective:    Patient ID: Lindsay Sheppard, female    DOB: [birth year], 77 y.o.   MRN: [id number]  HPI  Here to f/u; overall doing ok,  Pt denies chest pain, increased sob or doe, wheezing, orthopnea, PND, increased LE swelling, palpitations, dizziness or syncope.  Pt denies new neurological symptoms such as new headache, or facial or extremity weakness or numbness   Pt denies polydipsia, polyuria, or low sugar symptoms such as weakness or confusion improved with po intake.  Pt states overall good compliance with meds, trying to follow lower cholesterol, diabetic diet, wt overall stable but little exercise however. CBG's have been often 70 , and recnetly decr her lantus to 5 units. Clonidine patch expensive at the 3 mo supply - needs 2 boxes from [facility] due to cost.  Has not been as active lately and appears a bit slower, more frail today but no falls. Does have sense of ongoing fatigue, but denies signficant hypersomnolence. Past Medical History  Diagnosis Date  . POSTHERPETIC NEURALGIA 11/02/2009  . DIABETES MELLITUS, TYPE II 09/06/2007  . B12 DEFICIENCY 05/23/2007  . HYPERLIPIDEMIA 05/23/2007  . HYPERKALEMIA 05/30/2010  . ANEMIA-IRON DEFICIENCY 01/26/2007  . DEPRESSIVE DISORDER 01/25/2008  . BLEPHARITIS, BILATERAL 02/23/2008  . Other specified forms of hearing loss 05/30/2010  . HYPERTENSION 01/26/2007  . Atrial fibrillation 05/23/2007  . DIASTOLIC HEART FAILURE, CHRONIC 12/14/2009  . POSTURAL HYPOTENSION 02/07/2008  . ALLERGIC RHINITIS 11/02/2009  . GERD 01/26/2007  . CONSTIPATION 01/25/2008  . RENAL INSUFFICIENCY 07/02/2009  . RENAL CYST, LEFT 09/06/2007  . SKIN LESION 05/30/2010  . BACK PAIN 08/02/2007  . OSTEOPOROSIS 05/23/2007  . SYNCOPE 02/07/2008  . FATIGUE 09/06/2007  . Dysuria 09/05/2008  . Abdominal pain, epigastric 09/06/2007  . EPIGASTRIC TENDERNESS 10/12/2007  . PULMONARY EMBOLISM, HX OF 05/23/2007  . SHINGLES, HX OF 05/23/2007  . Pulmonary embolism 10/14/2010  . 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 Appendectomy   . Abdominal hysterectomy     reports that she has never smoked. She does not have any smokeless tobacco history on file. She reports that she does not drink alcohol or use illicit drugs. family history includes Breast cancer in her mother; Colon cancer in her father; Diabetes in her sister; Hypertension in her other; and Stroke in her father. Allergies  Allergen Reactions  . Amiodarone     REACTION: f? fluid retention  . Diazepam     REACTION: agitation  . Diltiazem Hcl   . Levofloxacin     REACTION: nausea  . Pregabalin     REACTION: hallucinations  . Spironolactone     REACTION: elevated K at lowest dose   Current Outpatient Prescriptions on File Prior to Visit  Medication Sig Dispense Refill  . cyanocobalamin (,VITAMIN B-12,) 1000 MCG/ML injection Inject 1,000 mcg into the muscle. Inject 1036mcg/ml once a month       . felodipine (PLENDIL) 2.5 MG 24 hr tablet Take 2.5 mg by mouth daily.        . ferrous sulfate 325 (65 FE) MG tablet Take 325 mg by mouth. Take 1 by mouth twice a day       . fexofenadine (ALLEGRA) 180 MG tablet Take 180 mg by mouth daily.        . furosemide (LASIX) 20 MG tablet Take 1 tablet (20 mg total) by mouth daily.  90 tablet  1  . gabapentin (NEURONTIN) 300 MG capsule Take 1 capsule (300 mg  total) by mouth at bedtime.  90 capsule  1  . glimepiride (AMARYL) 4 MG tablet Take 1 tablet (4 mg total) by mouth daily.  90 tablet  1  . glucose blood (ACCU-CHEK COMPACT TEST DRUM) test strip 1 each by Other route. 1 two times a day as directed dx code 250.02       . HYDROcodone-acetaminophen (NORCO) 5-325 MG per tablet Take 1 tablet by mouth. Take 1/2 - 1 tablet daily        . Insulin Syringe-Needle U-100 (B-D INSULIN SYRINGE) 31G X 5/16" 0.3 ML MISC by Does not apply route daily.        Marland Kitchen labetalol (NORMODYNE) 200 MG tablet Take 2 tablets (400 mg total) by mouth 2 (two) times daily. 2 by mouth two times a day  360  tablet  1  . metFORMIN (GLUCOPHAGE) 500 MG tablet Take 1 tablet (500 mg total) by mouth 2 (two) times daily with a meal. 1 po twice per day  180 tablet  1  . mometasone (NASONEX) 50 MCG/ACT nasal spray 2 sprays by Nasal route daily.        . pantoprazole (PROTONIX) 40 MG tablet Take 1 tablet (40 mg total) by mouth daily.  90 tablet  1  . pravastatin (PRAVACHOL) 40 MG tablet Take 0.5 tablets (20 mg total) by mouth daily. 1/2 daily  45 tablet  1  . sulfamethoxazole-trimethoprim (BACTRIM DS,SEPTRA DS) 800-160 MG per tablet Take 1 tablet by mouth daily.  90 tablet  1  . SYRINGE-NEEDLE, DISP, 3 ML 25G X 5/8" 3 ML MISC Use as directed       . warfarin (COUMADIN) 5 MG tablet Take 1 tablet (5 mg total) by mouth as directed.  90 tablet  1   No current facility-administered medications on file prior to visit.   Review of Systems Review of Systems  Constitutional: Negative for diaphoresis and unexpected weight change.  HENT: Negative for drooling and tinnitus.   Eyes: Negative for photophobia and visual disturbance.  Respiratory: Negative for choking and stridor.   Gastrointestinal: Negative for vomiting and blood in stool.  Genitourinary: Negative for hematuria and decreased urine volume.        Objective:   Physical Exam BP 120/48  Pulse 81  Temp(Src) 97.9 F (36.6 C) (Oral)  Ht 5\' 6"  (1.676 m)  Wt 139 lb 8 oz (63.277 kg)  BMI 22.52 kg/m2  SpO2 98% Physical Exam  VS noted, not ill appearing Constitutional: Pt appears well-developed and well-nourished.  HENT: Head: Normocephalic.  Right Ear: External ear normal.  Left Ear: External ear normal.  Eyes: Conjunctivae bilat erythema, drainage, and EOM are normal. Pupils are equal, round, and reactive to light.  Neck: Normal range of motion. Neck supple.  Cardiovascular: Normal rate and regular rhythm.   Pulmonary/Chest: Effort normal and breath sounds normal.  Abd:  Soft, NT, non-distended, + BS Neurological: Pt is alert. No cranial nerve  deficit.  Skin: Skin is warm. No erythema.  Psychiatric: Pt behavior is normal. Thought content normal.         Assessment & Plan:

## 2011-06-06 LAB — HEMOGLOBIN A1C: Hgb A1c MFr Bld: 7.3 % — ABNORMAL HIGH (ref 4.6–6.5)

## 2011-06-25 ENCOUNTER — Ambulatory Visit (INDEPENDENT_AMBULATORY_CARE_PROVIDER_SITE_OTHER): Payer: Medicare Other | Admitting: *Deleted

## 2011-06-25 DIAGNOSIS — I4891 Unspecified atrial fibrillation: Secondary | ICD-10-CM

## 2011-06-25 DIAGNOSIS — Z86718 Personal history of other venous thrombosis and embolism: Secondary | ICD-10-CM

## 2011-06-25 DIAGNOSIS — I2699 Other pulmonary embolism without acute cor pulmonale: Secondary | ICD-10-CM

## 2011-06-25 LAB — POCT INR: INR: 2.4

## 2011-07-02 ENCOUNTER — Other Ambulatory Visit: Payer: Self-pay | Admitting: Internal Medicine

## 2011-07-07 ENCOUNTER — Ambulatory Visit (INDEPENDENT_AMBULATORY_CARE_PROVIDER_SITE_OTHER): Payer: Medicare Other

## 2011-07-07 DIAGNOSIS — Z23 Encounter for immunization: Secondary | ICD-10-CM

## 2011-07-07 DIAGNOSIS — E538 Deficiency of other specified B group vitamins: Secondary | ICD-10-CM

## 2011-07-07 MED ORDER — CYANOCOBALAMIN 1000 MCG/ML IJ SOLN
1000.0000 ug | Freq: Once | INTRAMUSCULAR | Status: AC
  Administered 2011-07-07: 1000 ug via INTRAMUSCULAR

## 2011-07-11 ENCOUNTER — Other Ambulatory Visit: Payer: Self-pay | Admitting: Internal Medicine

## 2011-07-14 ENCOUNTER — Encounter: Payer: Self-pay | Admitting: Internal Medicine

## 2011-07-30 ENCOUNTER — Ambulatory Visit (INDEPENDENT_AMBULATORY_CARE_PROVIDER_SITE_OTHER): Payer: Medicare Other | Admitting: *Deleted

## 2011-07-30 DIAGNOSIS — I2699 Other pulmonary embolism without acute cor pulmonale: Secondary | ICD-10-CM

## 2011-07-30 DIAGNOSIS — I4891 Unspecified atrial fibrillation: Secondary | ICD-10-CM

## 2011-07-30 DIAGNOSIS — Z86718 Personal history of other venous thrombosis and embolism: Secondary | ICD-10-CM

## 2011-07-30 LAB — POCT INR: INR: 2.8

## 2011-08-04 ENCOUNTER — Ambulatory Visit (INDEPENDENT_AMBULATORY_CARE_PROVIDER_SITE_OTHER): Payer: Medicare Other

## 2011-08-04 DIAGNOSIS — E538 Deficiency of other specified B group vitamins: Secondary | ICD-10-CM

## 2011-08-04 MED ORDER — CYANOCOBALAMIN 1000 MCG/ML IJ SOLN
1000.0000 ug | Freq: Once | INTRAMUSCULAR | Status: AC
  Administered 2011-08-04: 1000 ug via INTRAMUSCULAR

## 2011-09-04 ENCOUNTER — Ambulatory Visit (INDEPENDENT_AMBULATORY_CARE_PROVIDER_SITE_OTHER)
Admission: RE | Admit: 2011-09-04 | Discharge: 2011-09-04 | Disposition: A | Discharge status: Home / Self Care | Payer: Medicare Other | Source: Ambulatory Visit | Attending: Internal Medicine | Admitting: Internal Medicine

## 2011-09-04 ENCOUNTER — Encounter: Payer: Self-pay | Admitting: Internal Medicine

## 2011-09-04 ENCOUNTER — Other Ambulatory Visit (INDEPENDENT_AMBULATORY_CARE_PROVIDER_SITE_OTHER): Payer: Medicare Other

## 2011-09-04 ENCOUNTER — Ambulatory Visit (INDEPENDENT_AMBULATORY_CARE_PROVIDER_SITE_OTHER): Payer: Medicare Other | Admitting: Internal Medicine

## 2011-09-04 VITALS — BP 128/62 | HR 83 | Temp 98.4°F | Ht 67.0 in | Wt 135.1 lb

## 2011-09-04 DIAGNOSIS — J189 Pneumonia, unspecified organism: Secondary | ICD-10-CM

## 2011-09-04 DIAGNOSIS — E119 Type 2 diabetes mellitus without complications: Secondary | ICD-10-CM

## 2011-09-04 DIAGNOSIS — R5383 Other fatigue: Secondary | ICD-10-CM | POA: Insufficient documentation

## 2011-09-04 DIAGNOSIS — R5381 Other malaise: Secondary | ICD-10-CM

## 2011-09-04 DIAGNOSIS — E538 Deficiency of other specified B group vitamins: Secondary | ICD-10-CM

## 2011-09-04 DIAGNOSIS — E860 Dehydration: Secondary | ICD-10-CM | POA: Insufficient documentation

## 2011-09-04 LAB — HEPATIC FUNCTION PANEL
AST: 16 U/L (ref 0–37)
Albumin: 3.8 g/dL (ref 3.5–5.2)
Alkaline Phosphatase: 51 U/L (ref 39–117)
Total Protein: 6.4 g/dL (ref 6.0–8.3)

## 2011-09-04 LAB — CBC WITH DIFFERENTIAL/PLATELET
Basophils Absolute: 0 10*3/uL (ref 0.0–0.1)
Lymphocytes Relative: 18.1 % (ref 12.0–46.0)
Monocytes Relative: 9.3 % (ref 3.0–12.0)
Neutrophils Relative %: 70.2 % (ref 43.0–77.0)
Platelets: 174 10*3/uL (ref 150.0–400.0)
RDW: 15.4 % — ABNORMAL HIGH (ref 11.5–14.6)

## 2011-09-04 LAB — BASIC METABOLIC PANEL
CO2: 24 mEq/L (ref 19–32)
Calcium: 9.4 mg/dL (ref 8.4–10.5)
Chloride: 107 mEq/L (ref 96–112)
Sodium: 139 mEq/L (ref 135–145)

## 2011-09-04 MED ORDER — CYANOCOBALAMIN 1000 MCG/ML IJ SOLN
1000.0000 ug | Freq: Once | INTRAMUSCULAR | Status: AC
  Administered 2011-09-04: 1000 ug via INTRAMUSCULAR

## 2011-09-04 MED ORDER — CEFTRIAXONE SODIUM 1 G IJ SOLR
1.0000 g | Freq: Once | INTRAMUSCULAR | Status: AC
  Administered 2011-09-04: 1 g via INTRAMUSCULAR

## 2011-09-04 MED ORDER — AZITHROMYCIN 250 MG PO TABS: ORAL_TABLET | ORAL | Status: AC

## 2011-09-04 MED ORDER — CEFUROXIME AXETIL 250 MG PO TABS: 250.0000 mg | ORAL_TABLET | Freq: Two times a day (BID) | ORAL | Status: AC

## 2011-09-04 MED ORDER — HYDROCODONE-HOMATROPINE 5-1.5 MG/5ML PO SYRP: 5.0000 mL | ORAL_SOLUTION | Freq: Four times a day (QID) | ORAL | Status: AC | PRN

## 2011-09-04 NOTE — Assessment & Plan Note (Signed)
Clinical dx - has bibas rales but not volume overloaded;  For tx as such with antibx/cough med and monitor closely for worsening;  Has very supportive family, for cxr today

## 2011-09-04 NOTE — Assessment & Plan Note (Signed)
With general weakness, for labs today, f/u next visit

## 2011-09-04 NOTE — Patient Instructions (Signed)
You had the antibiotic shot today Take all new medications as prescribed - the 2 antibiotics and cough medicine  (watch for diarrhea with the generic ceftin) Continue all other medications as before, except hold the lasix fluid pill, and the glimeparide You can re-start the glimeparide for blood sugar > 175 at Half pill You can re-start the lasix for any swelling worsening to the legs as long as weakness and dizziness have resolved Please return in 1 wk or sooner if needed Please go to XRAY in the Basement for the x-ray test Please go to LAB in the Basement for the blood and/or urine tests to be done today Please call the phone number [phone] (the PhoneTree System) for results of testing in 2-3 days;  When calling, simply dial the number, and when prompted enter the MRN number above (the Medical Record Number) and the # key, then the message should start.

## 2011-09-07 ENCOUNTER — Encounter: Payer: Self-pay | Admitting: Internal Medicine

## 2011-09-07 NOTE — Assessment & Plan Note (Signed)
Also for routine b12 today

## 2011-09-07 NOTE — Progress Notes (Signed)
Subjective:    Patient ID: Lindsay Sheppard, female    DOB: 06-May-1921, 78 y.o.   MRN: [id number]  HPI  Here with acute onset mild to mod 2-3 days ST, HA, general weakness and malaise, with prod cough greenish sputum, but Pt denies chest pain, increased sob or doe, wheezing, orthopnea, PND, increased LE swelling, palpitations, dizziness or syncope.  Pt denies new neurological symptoms such as new headache, or facial or extremity weakness or numbness  Pt denies polydipsia, polyuria, or low sugar symptoms such as weakness or confusion improved with po intake.  Pt states overall good compliance with meds, trying to follow lower cholesterol, diabetic diet, wt overall stable but little exercise however.    Past Medical History  Diagnosis Date  . POSTHERPETIC NEURALGIA 11/02/2009  . DIABETES MELLITUS, TYPE II 09/06/2007  . B12 DEFICIENCY 05/23/2007  . HYPERLIPIDEMIA 05/23/2007  . HYPERKALEMIA 05/30/2010  . ANEMIA-IRON DEFICIENCY 01/26/2007  . DEPRESSIVE DISORDER 01/25/2008  . BLEPHARITIS, BILATERAL 02/23/2008  . Other specified forms of hearing loss 05/30/2010  . HYPERTENSION 01/26/2007  . [place]ath-induced atrial fibrillation 05/23/2007  . DIASTOLIC HEART FAILURE, CHRONIC 12/14/2009  . POSTURAL HYPOTENSION 02/07/2008  . ALLERGIC RHINITIS 11/02/2009  . GERD 01/26/2007  . CONSTIPATION 01/25/2008  . RENAL INSUFFICIENCY 07/02/2009  . RENAL CYST, LEFT 09/06/2007  . SKIN LESION 05/30/2010  . BACK PAIN 08/02/2007  . OSTEOPOROSIS 05/23/2007  . SYNCOPE 02/07/2008  . FATIGUE 09/06/2007  . Dysuria 09/05/2008  . Abdominal pain, epigastric 09/06/2007  . EPIGASTRIC TENDERNESS 10/12/2007  . PULMONARY EMBOLISM, HX OF 05/23/2007  . SHINGLES, HX OF 05/23/2007  . Pulmonary embolism 10/14/2010  . 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 Appendectomy   . Abdominal hysterectomy     reports that she has never smoked. She does not have any smokeless tobacco history on file. She reports that she does not  drink alcohol or use illicit drugs. family history includes Breast cancer in her mother; Colon cancer in her father; Diabetes in her sister; Hypertension in her other; and Stroke in her father. Allergies  Allergen Reactions  . Amiodarone     REACTION: f? fluid retention  . Diazepam     REACTION: agitation  . Diltiazem Hcl   . Levofloxacin     REACTION: nausea  . Pregabalin     REACTION: hallucinations  . Spironolactone     REACTION: elevated K at lowest dose   Current Outpatient Prescriptions on File Prior to Visit  Medication Sig Dispense Refill  . ACCU-CHEK COMPACT STRIPS test strip TEST TWICE A DAY AS DIRECTED  51 each  10  . B-D INS SYRINGE 0.5CC/31GX5/16 31G X 5/16" 0.5 ML MISC USE DAILY AS DIRECTED  100 each  2  . cloNIDine (CATAPRES - DOSED IN MG/24 HR) 0.3 mg/24hr Place 1 patch (0.3 mg total) onto the skin every 7 (seven) days.  8 patch  0  . cyanocobalamin (,VITAMIN B-12,) 1000 MCG/ML injection Inject 1,000 mcg into the muscle. Inject 1083mcg/ml once a month       . felodipine (PLENDIL) 2.5 MG 24 hr tablet Take 2.5 mg by mouth daily.        . ferrous sulfate 325 (65 FE) MG tablet Take 325 mg by mouth. Take 1 by mouth twice a day       . fexofenadine (ALLEGRA) 180 MG tablet Take 180 mg by mouth daily.        . furosemide (LASIX) 20 MG tablet  Take 1 tablet (20 mg total) by mouth daily.  90 tablet  1  . gabapentin (NEURONTIN) 300 MG capsule Take 1 capsule (300 mg total) by mouth at bedtime.  90 capsule  1  . glimepiride (AMARYL) 4 MG tablet Take 1 tablet (4 mg total) by mouth daily.  90 tablet  3  . HYDROcodone-acetaminophen (NORCO) 5-325 MG per tablet Take 1 tablet by mouth. Take 1/2 - 1 tablet daily        . insulin glargine (LANTUS) 100 UNIT/ML injection Inject 5 units subcutaneously once a day  10 mL  5  . Insulin Syringe-Needle U-100 (B-D INSULIN SYRINGE) 31G X 5/16" 0.3 ML MISC by Does not apply route daily.        Marland Kitchen labetalol (NORMODYNE) 200 MG tablet Take 2 tablets (400  mg total) by mouth 2 (two) times daily. 2 by mouth two times a day  360 tablet  3  . metFORMIN (GLUCOPHAGE) 500 MG tablet Take 1 tablet (500 mg total) by mouth 2 (two) times daily with a meal. 1 po twice per day  180 tablet  1  . mometasone (NASONEX) 50 MCG/ACT nasal spray 2 sprays by Nasal route daily.        . pantoprazole (PROTONIX) 40 MG tablet Take 1 tablet (40 mg total) by mouth daily.  90 tablet  3  . pravastatin (PRAVACHOL) 40 MG tablet Take 0.5 tablets (20 mg total) by mouth daily. 1/2 daily  45 tablet  3  . sulfamethoxazole-trimethoprim (BACTRIM DS,SEPTRA DS) 800-160 MG per tablet Take 1 tablet by mouth daily.  90 tablet  3  . SYRINGE-NEEDLE, DISP, 3 ML 25G X 5/8" 3 ML MISC Use as directed       . warfarin (COUMADIN) 5 MG tablet Take 1 tablet (5 mg total) by mouth as directed.  90 tablet  1   Review of Systems Review of Systems  Constitutional: Negative for diaphoresis and unexpected weight change.  HENT: Negative for drooling and tinnitus.   Eyes: Negative for photophobia and visual disturbance.  Respiratory: Negative for choking and stridor.   Gastrointestinal: Negative for vomiting and blood in stool.  Genitourinary: Negative for hematuria and decreased urine volume.     Objective:   Physical Exam BP 128/62  Pulse 83  Temp(Src) 98.4 F (36.9 C) (Oral)  Ht 5\' 7"  (1.702 m)  Wt 135 lb 2 oz (61.292 kg)  BMI 21.16 kg/m2  SpO2 94% Physical Exam  VS noted Constitutional: Pt appears well-developed and well-nourished.  HENT: Head: Normocephalic.  Right Ear: External ear normal.  Left Ear: External ear normal.  Bilat tm's mild erythema.  Sinus nontender.  Pharynx mild erythema Eyes: Conjunctivae and EOM are normal. Pupils are equal, round, and reactive to light.  Neck: Normal range of motion. Neck supple.  Cardiovascular: Normal rate and regular rhythm.   Pulmonary/Chest: Effort normal and breath sounds decreased bilat with bibas crackles, left more than right - ? dry.  Abd:   Soft, NT, non-distended, + BS Neurological: Pt is alert. No cranial nerve deficit.  Skin: Skin is warm. No erythema.  Psychiatric: Pt behavior is normal. Thought content normal.     Assessment & Plan:

## 2011-09-07 NOTE — Assessment & Plan Note (Addendum)
With some decreased po intake recent, ok to hold the glimeparide for 5 days to avoid low sugars, adn re-start at half pill for elev sugar > 175

## 2011-09-07 NOTE — Assessment & Plan Note (Signed)
Mild orthostatic today, for increaed fluids, hold lasix for 5 days

## 2011-09-10 ENCOUNTER — Ambulatory Visit (INDEPENDENT_AMBULATORY_CARE_PROVIDER_SITE_OTHER): Payer: Medicare Other | Admitting: *Deleted

## 2011-09-10 ENCOUNTER — Ambulatory Visit (INDEPENDENT_AMBULATORY_CARE_PROVIDER_SITE_OTHER): Payer: Medicare Other | Admitting: Internal Medicine

## 2011-09-10 ENCOUNTER — Encounter: Payer: Self-pay | Admitting: Internal Medicine

## 2011-09-10 ENCOUNTER — Ambulatory Visit: Payer: Medicare Other

## 2011-09-10 VITALS — BP 118/58 | HR 82 | Temp 98.0°F | Ht 66.0 in | Wt 132.1 lb

## 2011-09-10 DIAGNOSIS — I10 Essential (primary) hypertension: Secondary | ICD-10-CM

## 2011-09-10 DIAGNOSIS — J209 Acute bronchitis, unspecified: Secondary | ICD-10-CM

## 2011-09-10 DIAGNOSIS — I2699 Other pulmonary embolism without acute cor pulmonale: Secondary | ICD-10-CM

## 2011-09-10 DIAGNOSIS — Z86718 Personal history of other venous thrombosis and embolism: Secondary | ICD-10-CM

## 2011-09-10 DIAGNOSIS — I509 Heart failure, unspecified: Secondary | ICD-10-CM

## 2011-09-10 DIAGNOSIS — I5032 Chronic diastolic (congestive) heart failure: Secondary | ICD-10-CM

## 2011-09-10 DIAGNOSIS — I4891 Unspecified atrial fibrillation: Secondary | ICD-10-CM

## 2011-09-10 DIAGNOSIS — E119 Type 2 diabetes mellitus without complications: Secondary | ICD-10-CM

## 2011-09-10 MED ORDER — CLONIDINE HCL 0.3 MG/24HR TD PTWK: 1.0000 | MEDICATED_PATCH | TRANSDERMAL | Status: DC

## 2011-09-10 NOTE — Patient Instructions (Signed)
Please continue to hold the lasix and glimeparide for at least one more week, to allow for better fluids and appetite Please consider return for OV if dizziness persists Continue all other medications as before The refill for clonidine was done as well

## 2011-09-14 ENCOUNTER — Encounter: Payer: Self-pay | Admitting: Internal Medicine

## 2011-09-14 DIAGNOSIS — J209 Acute bronchitis, unspecified: Secondary | ICD-10-CM | POA: Insufficient documentation

## 2011-09-14 NOTE — Progress Notes (Signed)
Subjective:    Patient ID: Lindsay Sheppard, female    DOB: 02/21/1921, 78 y.o.   MRN: [id number]  HPI  Here to f/u with daughters, overall about 50% improved it seems with bronchitis symtoms and general weakness and malaise, though more improvement remains. Pt denies chest pain, increased sob or doe, wheezing, orthopnea, PND, increased LE swelling, palpitations, dizziness or syncope.  Pt denies new neurological symptoms such as new headache, or facial or extremity weakness or numbness   Pt denies polydipsia, polyuria, or low sugar symptoms such as weakness or confusion improved with po intake.  Pt states overall good compliance with meds, trying to follow lower cholesterol, diabetic diet, wt overall stable but little exercise however.    Still holding on the lasix and amaryl, without worsening edema or low or high sugars.  Overall cbg;s still in the low 100's.   Past Medical History  Diagnosis Date  . POSTHERPETIC NEURALGIA 11/02/2009  . DIABETES MELLITUS, TYPE II 09/06/2007  . B12 DEFICIENCY 05/23/2007  . HYPERLIPIDEMIA 05/23/2007  . HYPERKALEMIA 05/30/2010  . ANEMIA-IRON DEFICIENCY 01/26/2007  . DEPRESSIVE DISORDER 01/25/2008  . BLEPHARITIS, BILATERAL 02/23/2008  . Other specified forms of hearing loss 05/30/2010  . HYPERTENSION 01/26/2007  . Atrial fibrillation 05/23/2007  . DIASTOLIC HEART FAILURE, CHRONIC 12/14/2009  . POSTURAL HYPOTENSION 02/07/2008  . ALLERGIC RHINITIS 11/02/2009  . GERD 01/26/2007  . CONSTIPATION 01/25/2008  . RENAL INSUFFICIENCY 07/02/2009  . RENAL CYST, LEFT 09/06/2007  . SKIN LESION 05/30/2010  . BACK PAIN 08/02/2007  . OSTEOPOROSIS 05/23/2007  . SYNCOPE 02/07/2008  . FATIGUE 09/06/2007  . Dysuria 09/05/2008  . Abdominal pain, epigastric 09/06/2007  . EPIGASTRIC TENDERNESS 10/12/2007  . PULMONARY EMBOLISM, HX OF 05/23/2007  . SHINGLES, HX OF 05/23/2007  . Pulmonary embolism 10/14/2010  . 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 Appendectomy   .  Abdominal hysterectomy     reports that she has never smoked. She does not have any smokeless tobacco history on file. She reports that she does not drink alcohol or use illicit drugs. family history includes Breast cancer in her mother; Colon cancer in her father; Diabetes in her sister; Hypertension in her other; and Stroke in her father. Allergies  Allergen Reactions  . Amiodarone     REACTION: f? fluid retention  . Diazepam     REACTION: agitation  . Diltiazem Hcl   . Levofloxacin     REACTION: nausea  . Pregabalin     REACTION: hallucinations  . Spironolactone     REACTION: elevated K at lowest dose   Current Outpatient Prescriptions on File Prior to Visit  Medication Sig Dispense Refill  . ACCU-CHEK COMPACT STRIPS test strip TEST TWICE A DAY AS DIRECTED  51 each  10  . B-D INS SYRINGE 0.5CC/31GX5/16 31G X 5/16" 0.5 ML MISC USE DAILY AS DIRECTED  100 each  2  . cefUROXime (CEFTIN) 250 MG tablet Take 1 tablet (250 mg total) by mouth 2 (two) times daily.  20 tablet  0  . cyanocobalamin (,VITAMIN B-12,) 1000 MCG/ML injection Inject 1,000 mcg into the muscle. Inject 1029mcg/ml once a month       . felodipine (PLENDIL) 2.5 MG 24 hr tablet Take 2.5 mg by mouth daily.        . ferrous sulfate 325 (65 FE) MG tablet Take 325 mg by mouth. Take 1 by mouth twice a day       . fexofenadine (ALLEGRA)  180 MG tablet Take 180 mg by mouth daily.        Marland Kitchen gabapentin (NEURONTIN) 300 MG capsule Take 1 capsule (300 mg total) by mouth at bedtime.  90 capsule  1  . HYDROcodone-acetaminophen (NORCO) 5-325 MG per tablet Take 1 tablet by mouth. Take 1/2 - 1 tablet daily        . HYDROcodone-homatropine (HYCODAN) 5-1.5 MG/5ML syrup Take 5 mLs by mouth every 6 (six) hours as needed for cough.  120 mL  1  . insulin glargine (LANTUS) 100 UNIT/ML injection Inject 5 units subcutaneously once a day  10 mL  5  . Insulin Syringe-Needle U-100 (B-D INSULIN SYRINGE) 31G X 5/16" 0.3 ML MISC by Does not apply route  daily.        Marland Kitchen labetalol (NORMODYNE) 200 MG tablet Take 2 tablets (400 mg total) by mouth 2 (two) times daily. 2 by mouth two times a day  360 tablet  3  . metFORMIN (GLUCOPHAGE) 500 MG tablet Take 1 tablet (500 mg total) by mouth 2 (two) times daily with a meal. 1 po twice per day  180 tablet  1  . mometasone (NASONEX) 50 MCG/ACT nasal spray 2 sprays by Nasal route daily.        . pantoprazole (PROTONIX) 40 MG tablet Take 1 tablet (40 mg total) by mouth daily.  90 tablet  3  . pravastatin (PRAVACHOL) 40 MG tablet Take 0.5 tablets (20 mg total) by mouth daily. 1/2 daily  45 tablet  3  . SYRINGE-NEEDLE, DISP, 3 ML 25G X 5/8" 3 ML MISC Use as directed       . warfarin (COUMADIN) 5 MG tablet Take 1 tablet (5 mg total) by mouth as directed.  90 tablet  1  . furosemide (LASIX) 20 MG tablet Take 1 tablet (20 mg total) by mouth daily.  90 tablet  1  . glimepiride (AMARYL) 4 MG tablet Take 1 tablet (4 mg total) by mouth daily.  90 tablet  3  . sulfamethoxazole-trimethoprim (BACTRIM DS,SEPTRA DS) 800-160 MG per tablet Take 1 tablet by mouth daily.  90 tablet  3   Review of Systems Review of Systems  Constitutional: Negative for diaphoresis and unexpected weight change.  HENT: Negative for drooling and tinnitus.   Eyes: Negative for photophobia and visual disturbance.  Respiratory: Negative for choking and stridor.   Gastrointestinal: Negative for vomiting and blood in stool.  Genitourinary: Negative for hematuria and decreased urine volume.    Objective:   Physical Exam BP 118/58  Pulse 82  Temp(Src) 98 F (36.7 C) (Oral)  Ht 5\' 6"  (1.676 m)  Wt 132 lb 2 oz (59.932 kg)  BMI 21.33 kg/m2  SpO2 95% Physical Exam  VS noted, still mild ill appearing but more energy to get up on exam table with less assist Constitutional: Pt appears well-developed and well-nourished.  HENT: Head: Normocephalic.  Right Ear: External ear normal.  Left Ear: External ear normal.  Bilat tm's mild erythema.  Sinus  nontender.  Pharynx mild erythema Eyes: Conjunctivae and EOM are normal. Pupils are equal, round, and reactive to light.  Neck: Normal range of motion. Neck supple.  Cardiovascular: Normal rate and regular rhythm.   Pulmonary/Chest: Effort normal and breath sounds normal.  Abd:  Soft, NT, non-distended, + BS Neurological: Pt is alert. No cranial nerve deficit.  Skin: Skin is warm. No erythema.  Psychiatric: Pt behavior is normal. Thought content normal.     Assessment & Plan:

## 2011-09-14 NOTE — Assessment & Plan Note (Signed)
stable overall by hx and exam, most recent data reviewed with pt, and pt to continue medical treatment as before  Lab Results  Component Value Date   HGBA1C 7.3* 06/03/2011   To cont to hold the amaryl , but to re-start for better appetitie and increased cbg's,  to f/u any worsening symptoms or concerns

## 2011-09-14 NOTE — Assessment & Plan Note (Signed)
stable overall by hx and exam, most recent data reviewed with pt, and pt to continue medical treatment as before  BP Readings from Last 3 Encounters:  09/10/11 118/58  09/04/11 128/62  06/03/11 120/48

## 2011-09-14 NOTE — Assessment & Plan Note (Signed)
Overall improved, cxr neg for pna last wk, Continue all other medications as before

## 2011-09-14 NOTE — Assessment & Plan Note (Signed)
stable overall by hx and exam, most recent data reviewed with pt, and pt to continue medical treatment as before  Lab Results  Component Value Date   WBC 4.4* 09/04/2011   HGB 11.6* 09/04/2011   HCT 34.5* 09/04/2011   PLT 174.0 09/04/2011   GLUCOSE 163* 09/04/2011   CHOL 133 06/03/2011   TRIG 76.0 06/03/2011   HDL 43.70 06/03/2011   LDLCALC 74 06/03/2011   ALT 15 09/04/2011   AST 16 09/04/2011   NA 139 09/04/2011   K 4.6 09/04/2011   CL 107 09/04/2011   CREATININE 1.3* 09/04/2011   BUN 26* 09/04/2011   CO2 24 09/04/2011   TSH 3.63 06/03/2011   INR 2.5 09/10/2011   HGBA1C 7.3* 06/03/2011   MICROALBUR 2.5* 06/03/2011   To cont to hold the lasix for one more wk, or until edema occurs

## 2011-10-07 ENCOUNTER — Ambulatory Visit (INDEPENDENT_AMBULATORY_CARE_PROVIDER_SITE_OTHER): Payer: Medicare Other

## 2011-10-07 DIAGNOSIS — E538 Deficiency of other specified B group vitamins: Secondary | ICD-10-CM

## 2011-10-07 MED ORDER — CYANOCOBALAMIN 1000 MCG/ML IJ SOLN
1000.0000 ug | Freq: Once | INTRAMUSCULAR | Status: AC
  Administered 2011-10-07: 1000 ug via INTRAMUSCULAR

## 2011-10-16 ENCOUNTER — Other Ambulatory Visit: Payer: Self-pay

## 2011-10-16 MED ORDER — METFORMIN HCL 500 MG PO TABS: 500.0000 mg | ORAL_TABLET | Freq: Two times a day (BID) | ORAL | Status: DC

## 2011-10-22 ENCOUNTER — Ambulatory Visit (INDEPENDENT_AMBULATORY_CARE_PROVIDER_SITE_OTHER): Payer: Medicare Other | Admitting: Pharmacist

## 2011-10-22 DIAGNOSIS — I2699 Other pulmonary embolism without acute cor pulmonale: Secondary | ICD-10-CM

## 2011-10-22 DIAGNOSIS — I4891 Unspecified atrial fibrillation: Secondary | ICD-10-CM

## 2011-10-22 LAB — POCT INR: INR: 2.4

## 2011-11-04 ENCOUNTER — Ambulatory Visit: Payer: Medicare Other

## 2011-11-05 ENCOUNTER — Ambulatory Visit (INDEPENDENT_AMBULATORY_CARE_PROVIDER_SITE_OTHER): Payer: Medicare Other

## 2011-11-05 DIAGNOSIS — E538 Deficiency of other specified B group vitamins: Secondary | ICD-10-CM

## 2011-11-05 MED ORDER — CYANOCOBALAMIN 1000 MCG/ML IJ SOLN
1000.0000 ug | Freq: Once | INTRAMUSCULAR | Status: AC
  Administered 2011-11-05: 1000 ug via INTRAMUSCULAR

## 2011-12-03 ENCOUNTER — Encounter: Payer: Self-pay | Admitting: Internal Medicine

## 2011-12-03 ENCOUNTER — Ambulatory Visit (INDEPENDENT_AMBULATORY_CARE_PROVIDER_SITE_OTHER): Payer: Medicare Other | Admitting: Pharmacist

## 2011-12-03 ENCOUNTER — Ambulatory Visit (INDEPENDENT_AMBULATORY_CARE_PROVIDER_SITE_OTHER): Payer: Medicare Other | Admitting: Internal Medicine

## 2011-12-03 DIAGNOSIS — M549 Dorsalgia, unspecified: Secondary | ICD-10-CM

## 2011-12-03 DIAGNOSIS — E119 Type 2 diabetes mellitus without complications: Secondary | ICD-10-CM

## 2011-12-03 DIAGNOSIS — E538 Deficiency of other specified B group vitamins: Secondary | ICD-10-CM

## 2011-12-03 DIAGNOSIS — I4891 Unspecified atrial fibrillation: Secondary | ICD-10-CM

## 2011-12-03 DIAGNOSIS — I10 Essential (primary) hypertension: Secondary | ICD-10-CM

## 2011-12-03 DIAGNOSIS — I2699 Other pulmonary embolism without acute cor pulmonale: Secondary | ICD-10-CM

## 2011-12-03 MED ORDER — CYANOCOBALAMIN 1000 MCG/ML IJ SOLN
1000.0000 ug | Freq: Once | INTRAMUSCULAR | Status: AC
  Administered 2011-12-03: 1000 ug via INTRAMUSCULAR

## 2011-12-03 MED ORDER — WARFARIN SODIUM 5 MG PO TABS: ORAL_TABLET | ORAL | Status: DC

## 2011-12-03 NOTE — Patient Instructions (Signed)
You had the B12 shots today Ok to hold on monthly B!2 shots for now We can plan on re-check of the B12 in 6-12 months Continue all other medications as before We can hold on further blood work today Please have the pharmacy call with any refills you may need. Please return in 6 months, or sooner if needed

## 2011-12-03 NOTE — Assessment & Plan Note (Signed)
stable overall by hx and exam, most recent data reviewed with pt, and pt to continue medical treatment as before, would not push for lower BP in her case due to recurrent dizziness, frailty BP Readings from Last 3 Encounters:  09/10/11 118/58  09/04/11 128/62  06/03/11 120/48

## 2011-12-03 NOTE — Assessment & Plan Note (Signed)
stable overall by hx and exam, most recent data reviewed with pt, and pt to continue medical treatment as before  Lab Results  Component Value Date   HGBA1C 7.3* 06/03/2011   Would not push at this point for very tight control due to frailty and risk of low sugars

## 2011-12-03 NOTE — Assessment & Plan Note (Addendum)
For b12 shot today, but I think can now hold off on monthly shots, to plan to re-check in 6-12 mo

## 2011-12-03 NOTE — Assessment & Plan Note (Signed)
Mild, stable, for tylenol prn, exam no  Change,  to f/u any worsening symptoms or concerns

## 2011-12-03 NOTE — Progress Notes (Signed)
Subjective:    Patient ID: Lindsay Sheppard, female    DOB: [birth year], 78 y.o.   MRN: [id number]  HPI  Here with family, overall doing very  Well, no new complaints except for a recurrent left great toe pain without redness/swelling and no actual pain today.  Had B12 shot today,  Pt denies chest pain, increased sob or doe, wheezing, orthopnea, PND, increased LE swelling, palpitations, dizziness or syncope.  Pt denies new neurological symptoms such as new headache, or facial or extremity weakness or numbness  Denies worsening depressive symptoms, suicidal ideation, or panic.   Pt denies fever, wt loss, night sweats, loss of appetite, or other constitutional symptoms . For coumadin check today, no overt recent bleeding or bruising. Pt continues to have recurring minor LBP without change in severity, bowel or bladder change, fever, wt loss,  worsening LE pain/numbness/weakness, gait change or falls.   Past Medical History  Diagnosis Date  . POSTHERPETIC NEURALGIA 11/02/2009  . DIABETES MELLITUS, TYPE II 09/06/2007  . B12 DEFICIENCY 05/23/2007  . HYPERLIPIDEMIA 05/23/2007  . HYPERKALEMIA 05/30/2010  . ANEMIA-IRON DEFICIENCY 01/26/2007  . DEPRESSIVE DISORDER 01/25/2008  . BLEPHARITIS, BILATERAL 02/23/2008  . Other specified forms of hearing loss 05/30/2010  . HYPERTENSION 01/26/2007  . Atrial fibrillation 05/23/2007  . DIASTOLIC HEART FAILURE, CHRONIC 12/14/2009  . POSTURAL HYPOTENSION 02/07/2008  . ALLERGIC RHINITIS 11/02/2009  . GERD 01/26/2007  . CONSTIPATION 01/25/2008  . RENAL INSUFFICIENCY 07/02/2009  . RENAL CYST, LEFT 09/06/2007  . SKIN LESION 05/30/2010  . BACK PAIN 08/02/2007  . OSTEOPOROSIS 05/23/2007  . SYNCOPE 02/07/2008  . FATIGUE 09/06/2007  . Dysuria 09/05/2008  . Abdominal pain, epigastric 09/06/2007  . EPIGASTRIC TENDERNESS 10/12/2007  . PULMONARY EMBOLISM, HX OF 05/23/2007  . SHINGLES, HX OF 05/23/2007  . Pulmonary embolism 10/14/2010  . 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 Appendectomy   . Abdominal hysterectomy     reports that she has never smoked. She does not have any smokeless tobacco history on file. She reports that she does not drink alcohol or use illicit drugs. family history includes Breast cancer in her mother; Colon cancer in her father; Diabetes in her sister; Hypertension in her other; and Stroke in her father. Allergies  Allergen Reactions  . Amiodarone     REACTION: f? fluid retention  . Diazepam     REACTION: agitation  . Diltiazem Hcl   . Levofloxacin     REACTION: nausea  . Pregabalin     REACTION: hallucinations  . Spironolactone     REACTION: elevated K at lowest dose   Current Outpatient Prescriptions on File Prior to Visit  Medication Sig Dispense Refill  . ACCU-CHEK COMPACT STRIPS test strip TEST TWICE A DAY AS DIRECTED  51 each  10  . B-D INS SYRINGE 0.5CC/31GX5/16 31G X 5/16" 0.5 ML MISC USE DAILY AS DIRECTED  100 each  2  . cloNIDine (CATAPRES - DOSED IN MG/24 HR) 0.3 mg/24hr Place 1 patch (0.3 mg total) onto the skin every 7 (seven) days.  12 patch  5  . cyanocobalamin (,VITAMIN B-12,) 1000 MCG/ML injection Inject 1,000 mcg into the muscle. Inject 1029mcg/ml once a month       . felodipine (PLENDIL) 2.5 MG 24 hr tablet Take 2.5 mg by mouth daily.        . ferrous sulfate 325 (65 FE) MG tablet Take 325 mg by mouth. Take 1 by mouth twice a  day       . fexofenadine (ALLEGRA) 180 MG tablet Take 180 mg by mouth daily.        . furosemide (LASIX) 20 MG tablet Take 1 tablet (20 mg total) by mouth daily.  90 tablet  1  . gabapentin (NEURONTIN) 300 MG capsule Take 1 capsule (300 mg total) by mouth at bedtime.  90 capsule  1  . glimepiride (AMARYL) 4 MG tablet Take 1 tablet (4 mg total) by mouth daily.  90 tablet  3  . HYDROcodone-acetaminophen (NORCO) 5-325 MG per tablet Take 1 tablet by mouth. Take 1/2 - 1 tablet daily        . insulin glargine (LANTUS) 100 UNIT/ML injection Inject 5 units subcutaneously once a  day  10 mL  5  . Insulin Syringe-Needle U-100 (B-D INSULIN SYRINGE) 31G X 5/16" 0.3 ML MISC by Does not apply route daily.        Marland Kitchen labetalol (NORMODYNE) 200 MG tablet Take 2 tablets (400 mg total) by mouth 2 (two) times daily. 2 by mouth two times a day  360 tablet  3  . metFORMIN (GLUCOPHAGE) 500 MG tablet Take 1 tablet (500 mg total) by mouth 2 (two) times daily with a meal. 1 po twice per day  180 tablet  3  . mometasone (NASONEX) 50 MCG/ACT nasal spray 2 sprays by Nasal route daily.        . pantoprazole (PROTONIX) 40 MG tablet Take 1 tablet (40 mg total) by mouth daily.  90 tablet  3  . pravastatin (PRAVACHOL) 40 MG tablet Take 0.5 tablets (20 mg total) by mouth daily. 1/2 daily  45 tablet  3  . sulfamethoxazole-trimethoprim (BACTRIM DS,SEPTRA DS) 800-160 MG per tablet Take 1 tablet by mouth daily.  90 tablet  3  . SYRINGE-NEEDLE, DISP, 3 ML 25G X 5/8" 3 ML MISC Use as directed       . warfarin (COUMADIN) 5 MG tablet Take 1 tablet (5 mg total) by mouth as directed.  90 tablet  1   No current facility-administered medications on file prior to visit.   Review of Systems Review of Systems  Constitutional: Negative for diaphoresis and unexpected weight change.  HENT: Negative for drooling and tinnitus.   Eyes: Negative for photophobia and visual disturbance.  Respiratory: Negative for choking and stridor.   Gastrointestinal: Negative for vomiting and blood in stool.  Genitourinary: Negative for hematuria and decreased urine volume.  Psychiatric/Behavioral: Negative for decreased concentration. The patient is not hyperactive.       Objective:   Physical Exam There were no vitals taken for this visit. Physical Exam  VS noted Constitutional: Pt appears well-developed and well-nourished.  HENT: Head: Normocephalic.  Right Ear: External ear normal.  Left Ear: External ear normal.  Eyes: Conjunctivae and EOM are normal. Pupils are equal, round, and reactive to light.  Neck: Normal range  of motion. Neck supple.  Cardiovascular: Normal rate and regular rhythm.   Pulmonary/Chest: Effort normal and breath sounds normal.  Abd:  Soft, NT, non-distended, + BS  Though some constipated today it seems Neurological: Pt is alert. No cranial nerve deficit. Some hearing loss evident Skin: Skin is warm. No erythema.  Psychiatric: Pt behavior is normal. Thought content normal. not overly nervous, or depressed affect    Assessment & Plan:

## 2012-01-14 ENCOUNTER — Ambulatory Visit (INDEPENDENT_AMBULATORY_CARE_PROVIDER_SITE_OTHER): Payer: Medicare Other | Admitting: *Deleted

## 2012-01-14 DIAGNOSIS — I2699 Other pulmonary embolism without acute cor pulmonale: Secondary | ICD-10-CM

## 2012-01-14 DIAGNOSIS — I4891 Unspecified atrial fibrillation: Secondary | ICD-10-CM

## 2012-02-25 ENCOUNTER — Ambulatory Visit (INDEPENDENT_AMBULATORY_CARE_PROVIDER_SITE_OTHER): Payer: Medicare Other | Admitting: Pharmacist

## 2012-02-25 DIAGNOSIS — I2699 Other pulmonary embolism without acute cor pulmonale: Secondary | ICD-10-CM

## 2012-02-25 DIAGNOSIS — I4891 Unspecified atrial fibrillation: Secondary | ICD-10-CM

## 2012-03-10 ENCOUNTER — Encounter: Payer: Self-pay | Admitting: Internal Medicine

## 2012-03-10 ENCOUNTER — Ambulatory Visit (INDEPENDENT_AMBULATORY_CARE_PROVIDER_SITE_OTHER): Payer: Medicare Other | Admitting: Internal Medicine

## 2012-03-10 ENCOUNTER — Other Ambulatory Visit (INDEPENDENT_AMBULATORY_CARE_PROVIDER_SITE_OTHER): Payer: Medicare Other

## 2012-03-10 VITALS — BP 132/52 | HR 78 | Temp 97.0°F | Wt 133.0 lb

## 2012-03-10 DIAGNOSIS — R269 Unspecified abnormalities of gait and mobility: Secondary | ICD-10-CM

## 2012-03-10 DIAGNOSIS — R5381 Other malaise: Secondary | ICD-10-CM

## 2012-03-10 DIAGNOSIS — M545 Low back pain, unspecified: Secondary | ICD-10-CM

## 2012-03-10 DIAGNOSIS — E538 Deficiency of other specified B group vitamins: Secondary | ICD-10-CM

## 2012-03-10 DIAGNOSIS — R5383 Other fatigue: Secondary | ICD-10-CM

## 2012-03-10 LAB — HEPATIC FUNCTION PANEL
ALT: 15 U/L (ref 0–35)
Albumin: 3.8 g/dL (ref 3.5–5.2)
Alkaline Phosphatase: 43 U/L (ref 39–117)
Total Protein: 6.1 g/dL (ref 6.0–8.3)

## 2012-03-10 LAB — URINALYSIS, ROUTINE W REFLEX MICROSCOPIC
Hgb urine dipstick: NEGATIVE
Nitrite: NEGATIVE
Specific Gravity, Urine: 1.025 (ref 1.000–1.030)
Urine Glucose: NEGATIVE
Urobilinogen, UA: 0.2 (ref 0.0–1.0)

## 2012-03-10 LAB — CBC WITH DIFFERENTIAL/PLATELET
Basophils Absolute: 0 10*3/uL (ref 0.0–0.1)
Hemoglobin: 10.9 g/dL — ABNORMAL LOW (ref 12.0–15.0)
Lymphocytes Relative: 14.8 % (ref 12.0–46.0)
Monocytes Relative: 8.5 % (ref 3.0–12.0)
Neutro Abs: 3.4 10*3/uL (ref 1.4–7.7)
Neutrophils Relative %: 74.4 % (ref 43.0–77.0)
RBC: 3.79 Mil/uL — ABNORMAL LOW (ref 3.87–5.11)
RDW: 15.7 % — ABNORMAL HIGH (ref 11.5–14.6)

## 2012-03-10 LAB — BASIC METABOLIC PANEL
BUN: 49 mg/dL — ABNORMAL HIGH (ref 6–23)
Calcium: 9.9 mg/dL (ref 8.4–10.5)
GFR: 25.84 mL/min — ABNORMAL LOW (ref >60.00)
Glucose, Bld: 146 mg/dL — ABNORMAL HIGH (ref 70–99)
Potassium: 5.5 mEq/L — ABNORMAL HIGH (ref 3.5–5.1)

## 2012-03-10 MED ORDER — CYANOCOBALAMIN 1000 MCG/ML IJ SOLN
1000.0000 ug | Freq: Once | INTRAMUSCULAR | Status: AC
  Administered 2012-03-10: 1000 ug via INTRAMUSCULAR

## 2012-03-10 NOTE — Assessment & Plan Note (Signed)
Worsening recnet with bilat LE burning pain, weakness, tendency to fall -- for MRI LS Spine

## 2012-03-10 NOTE — Assessment & Plan Note (Addendum)
ECG reviewed as per emr, Etiology unclear, Exam otherwise benign except for orthostasis as documented, to hold lasix, encouraged po fluids,  to check labs as documented, follow with expectant management, besides the lumbar pain I suspect etiology may be end life geriatric decline as well  Note: total time for pt hx, exam, review of record with family, determination of diagnosis and plan for eval and tx is > 40 min

## 2012-03-10 NOTE — Patient Instructions (Addendum)
You had the B12 shot today Your EKG was good today Your blood pressures did seem to fall on standing today (but not "too low") Continue all other medications as before, except you will need to hold the lasix Please go to LAB in the Basement for the blood and/or urine tests to be done today You will be contacted by phone if any changes need to be made immediately.  Otherwise, you will receive a letter about your results with an explanation. You will be contacted regarding the referral for: Physical Therapy for at home You will be contacted regarding the referral for: MRI for the lower back We will need to consider orthopedic or neurosurgury depending on the MRI results Please return in 2 weeks  Please call if you would like to be referred to [facility] [place] ([employer])

## 2012-03-11 ENCOUNTER — Encounter: Payer: Self-pay | Admitting: Internal Medicine

## 2012-03-11 LAB — HEMOGLOBIN A1C: Hgb A1c MFr Bld: 6 % (ref 4.6–6.5)

## 2012-03-13 NOTE — Progress Notes (Signed)
Subjective:    Patient ID: Lindsay Sheppard, female    DOB: 1921/03/13, 78 y.o.   MRN: [id number]  HPI  Here to f/u with family;  Multiple concerns today;  Family has noticed some BP lability at home with activity and postural change such as standing up but no particularly low blood pressures;  Does have incidentally recent mild left neck pain sharp and burning with radidation to the left trapezoid area, worse to turn the head, nothing makes better, but family does not want to focus on this today.  The concern is for a vague 2-3 wks "going downhill", general weakness, a few lbs wt loss, occasional dizziness, variable po intake but Denies worsening reflux, dysphagia, abd pain, n/v, bowel change or blood.   Pt denies fever, wt loss, night sweats, loss of appetite, or other constitutional symptoms.   Denies urinary symptoms such as dysuria, frequency, urgency,or hematuria.  Taking the lasix qod which has worked well to date, last PT about 2 yrs ago after pelvic fx.  Pt continues to have recurring several weeks LBP with increased severity to mod, occas severe, intermittent, but no bowel or bladder change, fever, wt loss,  worsening LE numbness, or falls, but has had worsening bilat radiation of the pain the to upper legs, worse to ambulation, and has tended to taking to standing less, mostly sitting, and not walking as much from room to room as she had before.  Denies worsening depressive symptoms, suicidal ideation, or panic. Past Medical History  Diagnosis Date  . POSTHERPETIC NEURALGIA 11/02/2009  . DIABETES MELLITUS, TYPE II 09/06/2007  . B12 DEFICIENCY 05/23/2007  . HYPERLIPIDEMIA 05/23/2007  . HYPERKALEMIA 05/30/2010  . ANEMIA-IRON DEFICIENCY 01/26/2007  . DEPRESSIVE DISORDER 01/25/2008  . BLEPHARITIS, BILATERAL 02/23/2008  . Other specified forms of hearing loss 05/30/2010  . HYPERTENSION 01/26/2007  . Atrial fibrillation 05/23/2007  . DIASTOLIC HEART FAILURE, CHRONIC 12/14/2009  . POSTURAL HYPOTENSION  02/07/2008  . ALLERGIC RHINITIS 11/02/2009  . GERD 01/26/2007  . CONSTIPATION 01/25/2008  . RENAL INSUFFICIENCY 07/02/2009  . RENAL CYST, LEFT 09/06/2007  . SKIN LESION 05/30/2010  . BACK PAIN 08/02/2007  . OSTEOPOROSIS 05/23/2007  . SYNCOPE 02/07/2008  . FATIGUE 09/06/2007  . Dysuria 09/05/2008  . Abdominal pain, epigastric 09/06/2007  . EPIGASTRIC TENDERNESS 10/12/2007  . PULMONARY EMBOLISM, HX OF 05/23/2007  . SHINGLES, HX OF 05/23/2007  . Pulmonary embolism 10/14/2010  . 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 Appendectomy   . Abdominal hysterectomy     reports that she has never smoked. She does not have any smokeless tobacco history on file. She reports that she does not drink alcohol or use illicit drugs. family history includes Breast cancer in her mother; Colon cancer in her father; Diabetes in her sister; Hypertension in her other; and Stroke in her father. Allergies  Allergen Reactions  . Amiodarone     REACTION: f? fluid retention  . Diazepam     REACTION: agitation  . Diltiazem Hcl   . Levofloxacin     REACTION: nausea  . Pregabalin     REACTION: hallucinations  . Spironolactone     REACTION: elevated K at lowest dose   Current Outpatient Prescriptions on File Prior to Visit  Medication Sig Dispense Refill  . ACCU-CHEK COMPACT STRIPS test strip TEST TWICE A DAY AS DIRECTED  51 each  10  . B-D INS SYRINGE 0.5CC/31GX5/16 31G X 5/16" 0.5 ML MISC USE DAILY AS  DIRECTED  100 each  2  . cloNIDine (CATAPRES - DOSED IN MG/24 HR) 0.3 mg/24hr Place 1 patch (0.3 mg total) onto the skin every 7 (seven) days.  12 patch  5  . cyanocobalamin (,VITAMIN B-12,) 1000 MCG/ML injection Inject 1,000 mcg into the muscle. Inject 1028mcg/ml once a month       . felodipine (PLENDIL) 2.5 MG 24 hr tablet Take 2.5 mg by mouth daily.        . ferrous sulfate 325 (65 FE) MG tablet Take 325 mg by mouth. Take 1 by mouth twice a day       . fexofenadine (ALLEGRA) 180 MG tablet  Take 180 mg by mouth daily.        . furosemide (LASIX) 20 MG tablet Take 1 tablet (20 mg total) by mouth daily.  90 tablet  1  . gabapentin (NEURONTIN) 300 MG capsule Take 1 capsule (300 mg total) by mouth at bedtime.  90 capsule  1  . glimepiride (AMARYL) 4 MG tablet Take 1 tablet (4 mg total) by mouth daily.  90 tablet  3  . HYDROcodone-acetaminophen (NORCO) 5-325 MG per tablet Take 1 tablet by mouth. Take 1/2 - 1 tablet daily        . insulin glargine (LANTUS) 100 UNIT/ML injection Inject 5 units subcutaneously once a day  10 mL  5  . Insulin Syringe-Needle U-100 (B-D INSULIN SYRINGE) 31G X 5/16" 0.3 ML MISC by Does not apply route daily.        Marland Kitchen labetalol (NORMODYNE) 200 MG tablet Take 2 tablets (400 mg total) by mouth 2 (two) times daily. 2 by mouth two times a day  360 tablet  3  . metFORMIN (GLUCOPHAGE) 500 MG tablet Take 1 tablet (500 mg total) by mouth 2 (two) times daily with a meal. 1 po twice per day  180 tablet  3  . mometasone (NASONEX) 50 MCG/ACT nasal spray 2 sprays by Nasal route daily.        . pantoprazole (PROTONIX) 40 MG tablet Take 1 tablet (40 mg total) by mouth daily.  90 tablet  3  . pravastatin (PRAVACHOL) 40 MG tablet Take 0.5 tablets (20 mg total) by mouth daily. 1/2 daily  45 tablet  3  . sulfamethoxazole-trimethoprim (BACTRIM DS,SEPTRA DS) 800-160 MG per tablet Take 1 tablet by mouth daily.  90 tablet  3  . SYRINGE-NEEDLE, DISP, 3 ML 25G X 5/8" 3 ML MISC Use as directed       . warfarin (COUMADIN) 5 MG tablet Take as directed by the coumadin clinic.  90 tablet  1   Review of Systems Review of Systems  Constitutional: Negative for diaphoresis .  HENT: Negative for drooling and tinnitus.   Eyes: Negative for photophobia and visual disturbance.  Respiratory: Negative for choking and stridor.   Gastrointestinal: Negative for vomiting and blood in stool.  Genitourinary: Negative for hematuria and decreased urine volume.  Musculoskeletal: Negative for gait problem.    Skin: Negative for color change and wound.  Neurological: Negative for tremors and numbness.  Psychiatric/Behavioral: Negative for decreased concentration. The patient is not hyperactive.      Objective:   Physical Exam BP 132/52  Pulse 78  Temp 97 F (36.1 C) (Oral)  Wt 133 lb (60.328 kg)  SpO2 96% Physical Exam  VS noted, orthostatic changes noted today on challenge Constitutional: Pt appears elderly, frail, fatigued,slower to move, requires assist for up on exam table  HENT: Head: Normocephalic.  Right Ear: External ear normal.  Left Ear: External ear normal.  Eyes: Conjunctivae and EOM are normal. Pupils are equal, round, and reactive to light.  Neck: Normal range of motion. Neck supple.  Cardiovascular: Normal rate and regular rhythm.   Pulmonary/Chest: Effort normal and breath sounds normal.  Abd:  Soft, NT, non-distended, + BS Neurological: Pt is alert. No cranial nerve deficit. motor 5/5, dtr intact to LE's Skin: Skin is warm. No erythema. No rash Psychiatric: Pt behavior is normal. Not depressed affect    Assessment & Plan:

## 2012-03-14 ENCOUNTER — Encounter: Payer: Self-pay | Admitting: Internal Medicine

## 2012-03-14 NOTE — Assessment & Plan Note (Signed)
Also for home pT to eval and tx

## 2012-03-14 NOTE — Assessment & Plan Note (Signed)
Family wants to blame her leg pain on holding on further b12 shots, will check B12 but very doubtful this is cause

## 2012-03-17 ENCOUNTER — Ambulatory Visit (INDEPENDENT_AMBULATORY_CARE_PROVIDER_SITE_OTHER): Payer: Medicare Other | Admitting: *Deleted

## 2012-03-17 DIAGNOSIS — I2699 Other pulmonary embolism without acute cor pulmonale: Secondary | ICD-10-CM

## 2012-03-17 DIAGNOSIS — I4891 Unspecified atrial fibrillation: Secondary | ICD-10-CM

## 2012-03-17 LAB — POCT INR: INR: 2.2

## 2012-03-18 ENCOUNTER — Ambulatory Visit
Admission: RE | Admit: 2012-03-18 | Discharge: 2012-03-18 | Disposition: A | Discharge status: Home / Self Care | Payer: Medicare Other | Source: Ambulatory Visit | Attending: Internal Medicine | Admitting: Internal Medicine

## 2012-03-18 DIAGNOSIS — M545 Low back pain: Secondary | ICD-10-CM

## 2012-03-22 ENCOUNTER — Encounter: Payer: Self-pay | Admitting: Internal Medicine

## 2012-03-24 ENCOUNTER — Encounter: Payer: Self-pay | Admitting: Internal Medicine

## 2012-03-24 ENCOUNTER — Telehealth: Payer: Self-pay | Admitting: Internal Medicine

## 2012-03-24 ENCOUNTER — Ambulatory Visit (INDEPENDENT_AMBULATORY_CARE_PROVIDER_SITE_OTHER): Payer: Medicare Other | Admitting: Internal Medicine

## 2012-03-24 ENCOUNTER — Other Ambulatory Visit (INDEPENDENT_AMBULATORY_CARE_PROVIDER_SITE_OTHER): Payer: Medicare Other

## 2012-03-24 VITALS — BP 152/60 | HR 70 | Temp 99.2°F | Ht 66.0 in | Wt 133.0 lb

## 2012-03-24 DIAGNOSIS — I10 Essential (primary) hypertension: Secondary | ICD-10-CM

## 2012-03-24 DIAGNOSIS — R509 Fever, unspecified: Secondary | ICD-10-CM

## 2012-03-24 DIAGNOSIS — E538 Deficiency of other specified B group vitamins: Secondary | ICD-10-CM

## 2012-03-24 DIAGNOSIS — M545 Low back pain, unspecified: Secondary | ICD-10-CM

## 2012-03-24 DIAGNOSIS — I5032 Chronic diastolic (congestive) heart failure: Secondary | ICD-10-CM

## 2012-03-24 DIAGNOSIS — E119 Type 2 diabetes mellitus without complications: Secondary | ICD-10-CM

## 2012-03-24 LAB — URINALYSIS, ROUTINE W REFLEX MICROSCOPIC
Bilirubin Urine: NEGATIVE
Hgb urine dipstick: NEGATIVE
Nitrite: NEGATIVE
Urine Glucose: NEGATIVE
Urobilinogen, UA: 0.2 (ref 0.0–1.0)

## 2012-03-24 MED ORDER — GABAPENTIN 300 MG PO CAPS: 300.0000 mg | ORAL_CAPSULE | Freq: Three times a day (TID) | ORAL | Status: DC

## 2012-03-24 MED ORDER — TRAMADOL HCL (ER BIPHASIC) 100 MG PO TB24: 1.0000 | ORAL_TABLET | Freq: Every day | ORAL | Status: DC | PRN

## 2012-03-24 MED ORDER — FUROSEMIDE 20 MG PO TABS: 20.0000 mg | ORAL_TABLET | Freq: Every day | ORAL | Status: DC | PRN

## 2012-03-24 MED ORDER — GLIMEPIRIDE 4 MG PO TABS: 2.0000 mg | ORAL_TABLET | Freq: Every day | ORAL | Status: DC

## 2012-03-24 NOTE — Progress Notes (Signed)
Subjective:    Patient ID: Lindsay Sheppard, female    DOB: [birth year], 78 y.o.   MRN: [id number]  HPI  Here to f/u; overall doing ok, more stamina today with holding the lasix and essentially no wt change from last visit,  Pt denies chest pain, increased sob or doe, wheezing, orthopnea, PND, increased LE swelling, palpitations, dizziness or syncope.  Does have low grade temp today but is not aware of this and denies chills, ST, cough,  Denies urinary symptoms such as dysuria, frequency, urgency,or hematuria, and no other new symptoms.  Pt continues to have recurring LBP without change in severity, bowel or bladder change, fever, wt loss,  worsening LE pain/numbness/weakness, gait change or falls. - Recent MRI without specific nerve impingement but signficant degen changes.  Of note, did miss her BP med last evening, but overall good compliance, and pt family remains very supportive.  Pt denies new neurological symptoms such as new headache, or facial or extremity weakness or numbness   Pt denies polydipsia, polyuria, or low sugar symptoms such as weakness or confusion improved with po intake. Past Medical History  Diagnosis Date  . POSTHERPETIC NEURALGIA 11/02/2009  . DIABETES MELLITUS, TYPE II 09/06/2007  . B12 DEFICIENCY 05/23/2007  . HYPERLIPIDEMIA 05/23/2007  . HYPERKALEMIA 05/30/2010  . ANEMIA-IRON DEFICIENCY 01/26/2007  . DEPRESSIVE DISORDER 01/25/2008  . BLEPHARITIS, BILATERAL 02/23/2008  . Other specified forms of hearing loss 05/30/2010  . HYPERTENSION 01/26/2007  . Atrial fibrillation 05/23/2007  . DIASTOLIC HEART FAILURE, CHRONIC 12/14/2009  . POSTURAL HYPOTENSION 02/07/2008  . ALLERGIC RHINITIS 11/02/2009  . GERD 01/26/2007  . CONSTIPATION 01/25/2008  . RENAL INSUFFICIENCY 07/02/2009  . RENAL CYST, LEFT 09/06/2007  . SKIN LESION 05/30/2010  . BACK PAIN 08/02/2007  . OSTEOPOROSIS 05/23/2007  . SYNCOPE 02/07/2008  . FATIGUE 09/06/2007  . Dysuria 09/05/2008  . Abdominal pain, epigastric 09/06/2007  .  EPIGASTRIC TENDERNESS 10/12/2007  . PULMONARY EMBOLISM, HX OF 05/23/2007  . SHINGLES, HX OF 05/23/2007  . Pulmonary embolism 10/14/2010  . 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 Appendectomy   . Abdominal hysterectomy     reports that she has never smoked. She does not have any smokeless tobacco history on file. She reports that she does not drink alcohol or use illicit drugs. family history includes Breast cancer in her mother; Colon cancer in her father; Diabetes in her sister; Hypertension in her other; and Stroke in her father. Allergies  Allergen Reactions  . Amiodarone     REACTION: f? fluid retention  . Diazepam     REACTION: agitation  . Diltiazem Hcl   . Levofloxacin     REACTION: nausea  . Pregabalin     REACTION: hallucinations  . Spironolactone     REACTION: elevated K at lowest dose   Current Outpatient Prescriptions on File Prior to Visit  Medication Sig Dispense Refill  . ACCU-CHEK COMPACT STRIPS test strip TEST TWICE A DAY AS DIRECTED  51 each  10  . B-D INS SYRINGE 0.5CC/31GX5/16 31G X 5/16" 0.5 ML MISC USE DAILY AS DIRECTED  100 each  2  . cloNIDine (CATAPRES - DOSED IN MG/24 HR) 0.3 mg/24hr Place 1 patch (0.3 mg total) onto the skin every 7 (seven) days.  12 patch  5  . cyanocobalamin (,VITAMIN B-12,) 1000 MCG/ML injection Inject 1,000 mcg into the muscle. Inject 1022mcg/ml once a month       . felodipine (PLENDIL) 2.5 MG 24  hr tablet Take 2.5 mg by mouth daily.        . ferrous sulfate 325 (65 FE) MG tablet Take 325 mg by mouth. Take 1 by mouth twice a day       . fexofenadine (ALLEGRA) 180 MG tablet Take 180 mg by mouth daily.        Marland Kitchen HYDROcodone-acetaminophen (NORCO) 5-325 MG per tablet Take 1 tablet by mouth. Take 1/2 - 1 tablet daily        . insulin glargine (LANTUS) 100 UNIT/ML injection Inject 5 units subcutaneously once a day  10 mL  5  . Insulin Syringe-Needle U-100 (B-D INSULIN SYRINGE) 31G X 5/16" 0.3 ML MISC by  Does not apply route daily.        Marland Kitchen labetalol (NORMODYNE) 200 MG tablet Take 2 tablets (400 mg total) by mouth 2 (two) times daily. 2 by mouth two times a day  360 tablet  3  . metFORMIN (GLUCOPHAGE) 500 MG tablet Take 1 tablet (500 mg total) by mouth 2 (two) times daily with a meal. 1 po twice per day  180 tablet  3  . mometasone (NASONEX) 50 MCG/ACT nasal spray 2 sprays by Nasal route daily.        . pantoprazole (PROTONIX) 40 MG tablet Take 1 tablet (40 mg total) by mouth daily.  90 tablet  3  . pravastatin (PRAVACHOL) 40 MG tablet Take 0.5 tablets (20 mg total) by mouth daily. 1/2 daily  45 tablet  3  . sulfamethoxazole-trimethoprim (BACTRIM DS,SEPTRA DS) 800-160 MG per tablet Take 1 tablet by mouth daily.  90 tablet  3  . SYRINGE-NEEDLE, DISP, 3 ML 25G X 5/8" 3 ML MISC Use as directed       . warfarin (COUMADIN) 5 MG tablet Take as directed by the coumadin clinic.  90 tablet  1  . DISCONTD: gabapentin (NEURONTIN) 300 MG capsule Take 1 capsule (300 mg total) by mouth at bedtime.  90 capsule  1  . DISCONTD: glimepiride (AMARYL) 4 MG tablet Take 1 tablet (4 mg total) by mouth daily.  90 tablet  3   Review of Systems  Constitutional: Negative for diaphoresis and unexpected weight change.  HENT: Negative for tinnitus.   Eyes: Negative for photophobia and visual disturbance.  Respiratory: Negative for choking and stridor.   Gastrointestinal: Negative for vomiting and blood in stool.  Genitourinary: Negative for hematuria and decreased urine volume.  Musculoskeletal: Negative for acute joint swelling Skin: Negative for color change and wound.  Neurological: Negative for tremors and numbness.     Objective:   Physical Exam BP 152/60  Pulse 70  Temp 99.2 F (37.3 C) (Oral)  Ht 5\' 6"  (1.676 m)  Wt 133 lb (60.328 kg)  BMI 21.47 kg/m2  SpO2 97% Physical Exam  VS noted Constitutional: Pt appears well-developed and well-nourished.  HENT: Head: Normocephalic.  Right Ear: External ear  normal.  Left Ear: External ear normal.  Eyes: Conjunctivae and EOM are normal. Pupils are equal, round, and reactive to light.  Neck: Normal range of motion. Neck supple.  Cardiovascular: Normal rate and regular rhythm.   Pulmonary/Chest: Effort normal and breath sounds without rales or wheezing.  Abd:  Soft, NT, non-distended, + BS, no flank tender Neurological: Pt is alert. Not confused, remains severe HOH  Skin: Skin is warm. No erythema. No LE edema Psychiatric: Pt behavior is normal. Minor nervous, appears brighter, a bit stronger today    Assessment & Plan:

## 2012-03-24 NOTE — Assessment & Plan Note (Addendum)
With hx of what sounds like pulm edema per family, no LE edema; no wt change since holding lasix last 2 wks,  To cont hold lasix but to give 20 mg prn for wt > 135 by scale at home with daily wts  Note: total time for pt hx, exam, review of record including MRI with pt and family, determination of diagnoses, and plan for further eval and tx is > 40 min

## 2012-03-24 NOTE — Telephone Encounter (Signed)
Lindsay Sheppard came two wks ago for a B-12 injection.  Her daughter set her up for one in Aug. And Sept.    Lindsay Sheppard wants to keep taking the B-12.  Will this be OK.  I told them I would call if she needs to cancel the B-12 appts.

## 2012-03-24 NOTE — Telephone Encounter (Signed)
Ok to cont shots monthly

## 2012-03-24 NOTE — Assessment & Plan Note (Signed)
?   Clinical signficance, exam benign, hold antibx, check UA today

## 2012-03-24 NOTE — Patient Instructions (Addendum)
Please decrease the glimeparide to HALF pill in the AM OK to increase the gabapentin 300 mg to three times per day to see if this helps the back pain; if does not help, ok to go back to once at night Take all new medications as prescribed - the tramadol ER 100 mg per day as needed (if ok with insurance) Please consider the Dry Weight to be 132 at home for now;  Please take the lasix 20 mg as needed for wt over 135 lbs Continue all other medications as before Please go to LAB in the Basement for the urine tests to be done today You will be contacted by phone if any changes need to be made immediately.  Otherwise, you will receive a letter about your results with an explanation. Please return in 3 mo with Lab testing done 3-5 days before

## 2012-03-24 NOTE — Assessment & Plan Note (Addendum)
I think she can hold  On further B12 shots for now, but family would like to cont, ok to cont for now, B12 normal with labs 2 wks ago

## 2012-03-24 NOTE — Assessment & Plan Note (Signed)
Nonsurgical, for trial increase gabapenin to tid but if not helping after 4-7 days, ok to decrease again to qhs, also gave tramadol ER 100 qd prn pain, cont PT

## 2012-03-24 NOTE — Assessment & Plan Note (Addendum)
overcontrolled by last a1c maybe with less po intake recently;  For safety, to decrease the glimeparide by half  Lab Results  Component Value Date   HGBA1C 6.0 03/10/2012

## 2012-03-24 NOTE — Assessment & Plan Note (Signed)
stable overall by hx and exam though elevated today after accidentally missed med, most recent data reviewed with pt, and pt to continue medical treatment as before BP Readings from Last 3 Encounters:  03/24/12 152/60  03/10/12 132/52  09/10/11 118/58

## 2012-03-24 NOTE — Telephone Encounter (Signed)
Informed the patients daughter Kendal Hymen

## 2012-04-05 DIAGNOSIS — M545 Low back pain: Secondary | ICD-10-CM

## 2012-04-05 DIAGNOSIS — I5032 Chronic diastolic (congestive) heart failure: Secondary | ICD-10-CM

## 2012-04-05 DIAGNOSIS — IMO0001 Reserved for inherently not codable concepts without codable children: Secondary | ICD-10-CM

## 2012-04-05 DIAGNOSIS — R269 Unspecified abnormalities of gait and mobility: Secondary | ICD-10-CM

## 2012-04-15 ENCOUNTER — Ambulatory Visit (INDEPENDENT_AMBULATORY_CARE_PROVIDER_SITE_OTHER): Payer: Medicare Other | Admitting: Pharmacist

## 2012-04-15 ENCOUNTER — Ambulatory Visit: Payer: Medicare Other

## 2012-04-15 DIAGNOSIS — I2699 Other pulmonary embolism without acute cor pulmonale: Secondary | ICD-10-CM

## 2012-04-15 DIAGNOSIS — I4891 Unspecified atrial fibrillation: Secondary | ICD-10-CM

## 2012-04-16 ENCOUNTER — Other Ambulatory Visit: Payer: Self-pay | Admitting: Internal Medicine

## 2012-05-05 ENCOUNTER — Encounter: Payer: Self-pay | Admitting: Pharmacist

## 2012-05-12 ENCOUNTER — Ambulatory Visit (INDEPENDENT_AMBULATORY_CARE_PROVIDER_SITE_OTHER): Payer: Medicare Other | Admitting: *Deleted

## 2012-05-12 DIAGNOSIS — I2699 Other pulmonary embolism without acute cor pulmonale: Secondary | ICD-10-CM

## 2012-05-12 DIAGNOSIS — I4891 Unspecified atrial fibrillation: Secondary | ICD-10-CM

## 2012-05-12 DIAGNOSIS — E538 Deficiency of other specified B group vitamins: Secondary | ICD-10-CM

## 2012-05-12 DIAGNOSIS — Z23 Encounter for immunization: Secondary | ICD-10-CM

## 2012-05-12 LAB — POCT INR: INR: 3.5

## 2012-05-12 MED ORDER — CYANOCOBALAMIN 1000 MCG/ML IJ SOLN
1000.0000 ug | Freq: Once | INTRAMUSCULAR | Status: AC
  Administered 2012-05-12: 1000 ug via INTRAMUSCULAR

## 2012-05-19 ENCOUNTER — Ambulatory Visit: Payer: Medicare Other

## 2012-06-03 ENCOUNTER — Ambulatory Visit (INDEPENDENT_AMBULATORY_CARE_PROVIDER_SITE_OTHER): Payer: Medicare Other | Admitting: *Deleted

## 2012-06-03 DIAGNOSIS — I4891 Unspecified atrial fibrillation: Secondary | ICD-10-CM

## 2012-06-03 DIAGNOSIS — I2699 Other pulmonary embolism without acute cor pulmonale: Secondary | ICD-10-CM

## 2012-06-03 LAB — POCT INR: INR: 3.9

## 2012-06-08 ENCOUNTER — Ambulatory Visit: Payer: Medicare Other | Admitting: Internal Medicine

## 2012-06-23 ENCOUNTER — Other Ambulatory Visit (INDEPENDENT_AMBULATORY_CARE_PROVIDER_SITE_OTHER): Payer: Medicare Other

## 2012-06-23 ENCOUNTER — Other Ambulatory Visit: Payer: Self-pay | Admitting: Internal Medicine

## 2012-06-23 ENCOUNTER — Encounter: Payer: Self-pay | Admitting: Internal Medicine

## 2012-06-23 ENCOUNTER — Ambulatory Visit (INDEPENDENT_AMBULATORY_CARE_PROVIDER_SITE_OTHER): Payer: Medicare Other | Admitting: General Practice

## 2012-06-23 ENCOUNTER — Ambulatory Visit (INDEPENDENT_AMBULATORY_CARE_PROVIDER_SITE_OTHER): Payer: Medicare Other | Admitting: Internal Medicine

## 2012-06-23 VITALS — BP 118/60 | HR 84 | Temp 97.1°F | Ht 66.0 in | Wt 137.0 lb

## 2012-06-23 DIAGNOSIS — I4891 Unspecified atrial fibrillation: Secondary | ICD-10-CM

## 2012-06-23 DIAGNOSIS — I2699 Other pulmonary embolism without acute cor pulmonale: Secondary | ICD-10-CM

## 2012-06-23 DIAGNOSIS — E538 Deficiency of other specified B group vitamins: Secondary | ICD-10-CM

## 2012-06-23 DIAGNOSIS — E785 Hyperlipidemia, unspecified: Secondary | ICD-10-CM

## 2012-06-23 DIAGNOSIS — R109 Unspecified abdominal pain: Secondary | ICD-10-CM

## 2012-06-23 DIAGNOSIS — E119 Type 2 diabetes mellitus without complications: Secondary | ICD-10-CM

## 2012-06-23 DIAGNOSIS — I10 Essential (primary) hypertension: Secondary | ICD-10-CM

## 2012-06-23 LAB — URINALYSIS, ROUTINE W REFLEX MICROSCOPIC
Bilirubin Urine: NEGATIVE
Ketones, ur: NEGATIVE
Specific Gravity, Urine: 1.02 (ref 1.000–1.030)
Urine Glucose: NEGATIVE
Urobilinogen, UA: 0.2 (ref 0.0–1.0)

## 2012-06-23 LAB — POCT INR: INR: 2.9

## 2012-06-23 MED ORDER — CYANOCOBALAMIN 1000 MCG/ML IJ SOLN
1000.0000 ug | Freq: Once | INTRAMUSCULAR | Status: AC
  Administered 2012-06-23: 1000 ug via INTRAMUSCULAR

## 2012-06-23 MED ORDER — CEPHALEXIN 500 MG PO CAPS: 500.0000 mg | ORAL_CAPSULE | Freq: Four times a day (QID) | ORAL | Status: DC

## 2012-06-23 NOTE — Patient Instructions (Addendum)
You had the B12 shot today Please try the sample nasonex;  Please call for ENT referral if not improved Please go to LAB in the Basement for the urine tests to be done today You will be contacted by phone if any changes need to be made immediately.  Otherwise, you will receive a letter about your results with an explanation Please remember to sign up for My Chart at your earliest convenience, as this will be important to you in the future with finding out test results. Please keep your appointments with your specialists as you have planned - Dr Kathrene Bongo yearly as you have planned Please return in 3 months, or sooner if needed

## 2012-06-23 NOTE — Progress Notes (Signed)
Subjective:    Patient ID: Lindsay Sheppard, female    DOB: May 05, 1921, 78 y.o.   MRN: [id number]  HPI  Here to f/u after stopping lasix July 2013, BP recently has been elevated in the AM even up to 190 or so in the AM, and somewhat better after a few doses of lasix recently;  Pt denies chest pain, increased sob or doe, wheezing, orthopnea, PND, increased LE swelling, palpitations, dizziness or syncope.  Pt denies new neurological symptoms such as new headache, or facial or extremity weakness or numbness   Pt denies polydipsia, polyuria.  Duaghter also mentions she has been elev in the past with UTI - would like checked as she has had no symptoms with UTI in the past. Pt continues to have recurring LBP without change in severity, bowel or bladder change, fever, wt loss,  worsening LE pain/numbness/weakness, gait change or falls.  Did not fill the tramadol as she has done ok without it.  MRI [id number] reviewed with pt/daughter today.  Did reduce the amaryl last visit with A1c at 6.0 without recent low sugars or polys.  Now on amaryl 2 mg .     Past Medical History  Diagnosis Date  . POSTHERPETIC NEURALGIA 11/02/2009  . DIABETES MELLITUS, TYPE II 09/06/2007  . B12 DEFICIENCY 05/23/2007  . HYPERLIPIDEMIA 05/23/2007  . HYPERKALEMIA 05/30/2010  . ANEMIA-IRON DEFICIENCY 01/26/2007  . DEPRESSIVE DISORDER 01/25/2008  . BLEPHARITIS, BILATERAL 02/23/2008  . Other specified forms of hearing loss 05/30/2010  . HYPERTENSION 01/26/2007  . Atrial fibrillation 05/23/2007  . DIASTOLIC HEART FAILURE, CHRONIC 12/14/2009  . POSTURAL HYPOTENSION 02/07/2008  . ALLERGIC RHINITIS 11/02/2009  . GERD 01/26/2007  . CONSTIPATION 01/25/2008  . RENAL INSUFFICIENCY 07/02/2009  . RENAL CYST, LEFT 09/06/2007  . SKIN LESION 05/30/2010  . BACK PAIN 08/02/2007  . OSTEOPOROSIS 05/23/2007  . SYNCOPE 02/07/2008  . FATIGUE 09/06/2007  . Dysuria 09/05/2008  . Abdominal pain, epigastric 09/06/2007  . EPIGASTRIC TENDERNESS 10/12/2007  . PULMONARY EMBOLISM, HX  OF 05/23/2007  . SHINGLES, HX OF 05/23/2007  . Pulmonary embolism 10/14/2010  . 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 Appendectomy   . Abdominal hysterectomy     reports that she has never smoked. She does not have any smokeless tobacco history on file. She reports that she does not drink alcohol or use illicit drugs. family history includes Breast cancer in her mother; Colon cancer in her father; Diabetes in her sister; Hypertension in her other; and Stroke in her father. Allergies  Allergen Reactions  . Amiodarone     REACTION: f? fluid retention  . Diazepam     REACTION: agitation  . Diltiazem Hcl   . Levofloxacin     REACTION: nausea  . Pregabalin     REACTION: hallucinations  . Spironolactone     REACTION: elevated K at lowest dose   Current Outpatient Prescriptions on File Prior to Visit  Medication Sig Dispense Refill  . ACCU-CHEK COMPACT STRIPS test strip TEST TWICE A DAY AS DIRECTED  51 each  10  . B-D INS SYRINGE 0.5CC/31GX5/16 31G X 5/16" 0.5 ML MISC USE DAILY AS DIRECTED  100 each  11  . cloNIDine (CATAPRES - DOSED IN MG/24 HR) 0.3 mg/24hr Place 1 patch (0.3 mg total) onto the skin every 7 (seven) days.  12 patch  5  . cyanocobalamin (,VITAMIN B-12,) 1000 MCG/ML injection Inject 1,000 mcg into the muscle. Inject 1035mcg/ml once a month       .  felodipine (PLENDIL) 2.5 MG 24 hr tablet Take 2.5 mg by mouth daily.        . ferrous sulfate 325 (65 FE) MG tablet Take 325 mg by mouth. Take 1 by mouth twice a day       . fexofenadine (ALLEGRA) 180 MG tablet Take 180 mg by mouth daily.        . furosemide (LASIX) 20 MG tablet Take 1 tablet (20 mg total) by mouth daily as needed.  90 tablet  3  . gabapentin (NEURONTIN) 300 MG capsule Take 1 capsule (300 mg total) by mouth 3 (three) times daily.  90 capsule  5  . glimepiride (AMARYL) 4 MG tablet Take 0.5 tablets (2 mg total) by mouth daily.  45 tablet  3  . HYDROcodone-acetaminophen (NORCO)  5-325 MG per tablet Take 1 tablet by mouth. Take 1/2 - 1 tablet daily        . insulin glargine (LANTUS) 100 UNIT/ML injection Inject 5 units subcutaneously once a day  10 mL  5  . Insulin Syringe-Needle U-100 (B-D INSULIN SYRINGE) 31G X 5/16" 0.3 ML MISC by Does not apply route daily.        Marland Kitchen labetalol (NORMODYNE) 200 MG tablet Take 2 tablets (400 mg total) by mouth 2 (two) times daily. 2 by mouth two times a day  360 tablet  3  . metFORMIN (GLUCOPHAGE) 500 MG tablet Take 1 tablet (500 mg total) by mouth 2 (two) times daily with a meal. 1 po twice per day  180 tablet  3  . mometasone (NASONEX) 50 MCG/ACT nasal spray 2 sprays by Nasal route daily.        . pantoprazole (PROTONIX) 40 MG tablet Take 1 tablet (40 mg total) by mouth daily.  90 tablet  3  . pravastatin (PRAVACHOL) 40 MG tablet Take 0.5 tablets (20 mg total) by mouth daily. 1/2 daily  45 tablet  3  . sulfamethoxazole-trimethoprim (BACTRIM DS,SEPTRA DS) 800-160 MG per tablet Take 1 tablet by mouth daily.  90 tablet  3  . SYRINGE-NEEDLE, DISP, 3 ML 25G X 5/8" 3 ML MISC Use as directed       . TraMADol HCl 100 MG TB24 Take 1 tablet by mouth daily as needed.  30 tablet  5  . warfarin (COUMADIN) 5 MG tablet Take as directed by the coumadin clinic.  90 tablet  1   No current facility-administered medications on file prior to visit.   Review of Systems  Constitutional: Negative for diaphoresis and unexpected weight change.  HENT: Negative for tinnitus.   Eyes: Negative for photophobia and visual disturbance.  Respiratory: Negative for choking and stridor.   Gastrointestinal: Negative for vomiting and blood in stool.  Genitourinary: Negative for hematuria and decreased urine volume.  Musculoskeletal: Negative for gait problem.  Skin: Negative for color change and wound.  Neurological: Negative for tremors and numbness.  Psychiatric/Behavioral: Negative for decreased concentration. The patient is not hyperactive.       Objective:    Physical Exam BP 118/60  Pulse 84  Temp 97.1 F (36.2 C) (Oral)  Ht 5\' 6"  (1.676 m)  Wt 137 lb (62.143 kg)  BMI 22.11 kg/m2  SpO2 96% Physical Exam  VS noted Constitutional: Pt appears thin/elderly.  HENT: Head: Normocephalic.  Right Ear: External ear normal.  Left Ear: External ear normal.  Eyes: Conjunctivae and EOM are normal. Pupils are equal, round, and reactive to light.  Neck: Normal range of motion. Neck supple.  Cardiovascular: Normal rate and regular rhythm.   Pulmonary/Chest: Effort normal and breath sounds normal.  Abd:  Soft, NT, non-distended, + BS except for mild low mid abd tender Neurological: Pt is alert. Not confused  Skin: Skin is warm. No erythema.  Psychiatric: Pt behavior is normal. Thought content normal.     Assessment & Plan:

## 2012-06-27 ENCOUNTER — Encounter: Payer: Self-pay | Admitting: Internal Medicine

## 2012-06-27 NOTE — Assessment & Plan Note (Signed)
Has midl low mid tender - for UA

## 2012-06-27 NOTE — Assessment & Plan Note (Signed)
stable overall by hx and exam, most recent data reviewed with pt, and pt to continue medical treatment as before Lab Results  Component Value Date   HGBA1C 6.0 03/10/2012   Declines further labs today

## 2012-06-27 NOTE — Assessment & Plan Note (Signed)
For b12 IM today 

## 2012-06-27 NOTE — Assessment & Plan Note (Signed)
Has elev BP first off in the AM, but ok after takes meds, asypmpt, good control here today, The current medical regimen is effective;  continue present plan and medications.

## 2012-06-27 NOTE — Assessment & Plan Note (Signed)
To continue diet and pravachol - declines lab today,  Lab Results  Component Value Date   LDLCALC 74 06/03/2011

## 2012-07-10 ENCOUNTER — Other Ambulatory Visit: Payer: Self-pay | Admitting: Internal Medicine

## 2012-07-14 LAB — HM MAMMOGRAPHY

## 2012-07-15 ENCOUNTER — Other Ambulatory Visit: Payer: Self-pay

## 2012-07-15 MED ORDER — PRAVASTATIN SODIUM 40 MG PO TABS: 20.0000 mg | ORAL_TABLET | Freq: Every day | ORAL | Status: DC

## 2012-07-15 MED ORDER — PANTOPRAZOLE SODIUM 40 MG PO TBEC: 40.0000 mg | DELAYED_RELEASE_TABLET | Freq: Every day | ORAL | Status: DC

## 2012-07-21 ENCOUNTER — Ambulatory Visit (INDEPENDENT_AMBULATORY_CARE_PROVIDER_SITE_OTHER): Payer: Medicare Other | Admitting: General Practice

## 2012-07-21 ENCOUNTER — Ambulatory Visit: Payer: Medicare Other | Admitting: Internal Medicine

## 2012-07-21 DIAGNOSIS — E538 Deficiency of other specified B group vitamins: Secondary | ICD-10-CM

## 2012-07-21 DIAGNOSIS — I4891 Unspecified atrial fibrillation: Secondary | ICD-10-CM

## 2012-07-21 DIAGNOSIS — I2699 Other pulmonary embolism without acute cor pulmonale: Secondary | ICD-10-CM

## 2012-07-21 LAB — POCT INR: INR: 2.8

## 2012-07-21 MED ORDER — CYANOCOBALAMIN 1000 MCG/ML IJ SOLN
1000.0000 ug | Freq: Once | INTRAMUSCULAR | Status: AC
  Administered 2012-07-21: 1000 ug via INTRAMUSCULAR

## 2012-07-26 ENCOUNTER — Encounter: Payer: Self-pay | Admitting: Internal Medicine

## 2012-08-12 ENCOUNTER — Other Ambulatory Visit: Payer: Self-pay

## 2012-08-12 MED ORDER — SULFAMETHOXAZOLE-TRIMETHOPRIM 800-160 MG PO TABS: 1.0000 | ORAL_TABLET | Freq: Every day | ORAL | Status: DC

## 2012-08-12 MED ORDER — LABETALOL HCL 200 MG PO TABS: 400.0000 mg | ORAL_TABLET | Freq: Two times a day (BID) | ORAL | Status: DC

## 2012-08-13 ENCOUNTER — Telehealth: Payer: Self-pay | Admitting: *Deleted

## 2012-08-13 NOTE — Telephone Encounter (Signed)
Message left for patient making her aware that BMET needs to be drawn Monday or Tuesday.  LM on voicemail.  Will follow up Monday.

## 2012-08-16 ENCOUNTER — Other Ambulatory Visit: Payer: Self-pay | Admitting: *Deleted

## 2012-08-16 ENCOUNTER — Other Ambulatory Visit: Payer: Self-pay

## 2012-08-16 DIAGNOSIS — I2699 Other pulmonary embolism without acute cor pulmonale: Secondary | ICD-10-CM

## 2012-08-16 DIAGNOSIS — I4891 Unspecified atrial fibrillation: Secondary | ICD-10-CM

## 2012-08-16 MED ORDER — METFORMIN HCL 500 MG PO TABS: 500.0000 mg | ORAL_TABLET | Freq: Two times a day (BID) | ORAL | Status: DC

## 2012-08-16 MED ORDER — WARFARIN SODIUM 5 MG PO TABS: ORAL_TABLET | ORAL | Status: DC

## 2012-08-17 ENCOUNTER — Ambulatory Visit (INDEPENDENT_AMBULATORY_CARE_PROVIDER_SITE_OTHER): Payer: Medicare Other | Admitting: General Practice

## 2012-08-17 DIAGNOSIS — E538 Deficiency of other specified B group vitamins: Secondary | ICD-10-CM

## 2012-08-17 DIAGNOSIS — I4891 Unspecified atrial fibrillation: Secondary | ICD-10-CM

## 2012-08-17 DIAGNOSIS — I2699 Other pulmonary embolism without acute cor pulmonale: Secondary | ICD-10-CM

## 2012-08-17 MED ORDER — CYANOCOBALAMIN 1000 MCG/ML IJ SOLN
1000.0000 ug | Freq: Once | INTRAMUSCULAR | Status: AC
  Administered 2012-08-17: 1000 ug via INTRAMUSCULAR

## 2012-08-17 NOTE — Telephone Encounter (Signed)
Note made in error

## 2012-08-19 ENCOUNTER — Ambulatory Visit: Payer: Medicare Other

## 2012-08-24 ENCOUNTER — Other Ambulatory Visit: Payer: Self-pay | Admitting: *Deleted

## 2012-08-24 DIAGNOSIS — I4891 Unspecified atrial fibrillation: Secondary | ICD-10-CM

## 2012-08-24 DIAGNOSIS — I2699 Other pulmonary embolism without acute cor pulmonale: Secondary | ICD-10-CM

## 2012-08-24 MED ORDER — WARFARIN SODIUM 5 MG PO TABS: ORAL_TABLET | ORAL | Status: DC

## 2012-09-10 ENCOUNTER — Ambulatory Visit (INDEPENDENT_AMBULATORY_CARE_PROVIDER_SITE_OTHER): Payer: Medicare Other | Admitting: General Practice

## 2012-09-10 DIAGNOSIS — I4891 Unspecified atrial fibrillation: Secondary | ICD-10-CM

## 2012-09-10 DIAGNOSIS — I2699 Other pulmonary embolism without acute cor pulmonale: Secondary | ICD-10-CM

## 2012-09-10 LAB — POCT INR: INR: 3.9

## 2012-09-21 ENCOUNTER — Encounter: Payer: Self-pay | Admitting: Internal Medicine

## 2012-09-21 ENCOUNTER — Ambulatory Visit (INDEPENDENT_AMBULATORY_CARE_PROVIDER_SITE_OTHER): Payer: Medicare Other | Admitting: General Practice

## 2012-09-21 ENCOUNTER — Other Ambulatory Visit (INDEPENDENT_AMBULATORY_CARE_PROVIDER_SITE_OTHER): Payer: Medicare Other

## 2012-09-21 ENCOUNTER — Ambulatory Visit (INDEPENDENT_AMBULATORY_CARE_PROVIDER_SITE_OTHER): Payer: Medicare Other | Admitting: Internal Medicine

## 2012-09-21 VITALS — BP 104/60 | HR 79 | Temp 97.9°F | Ht 66.0 in | Wt 133.2 lb

## 2012-09-21 DIAGNOSIS — E119 Type 2 diabetes mellitus without complications: Secondary | ICD-10-CM

## 2012-09-21 DIAGNOSIS — I4891 Unspecified atrial fibrillation: Secondary | ICD-10-CM

## 2012-09-21 DIAGNOSIS — I2699 Other pulmonary embolism without acute cor pulmonale: Secondary | ICD-10-CM

## 2012-09-21 DIAGNOSIS — E538 Deficiency of other specified B group vitamins: Secondary | ICD-10-CM

## 2012-09-21 DIAGNOSIS — E785 Hyperlipidemia, unspecified: Secondary | ICD-10-CM

## 2012-09-21 DIAGNOSIS — I10 Essential (primary) hypertension: Secondary | ICD-10-CM

## 2012-09-21 LAB — LIPID PANEL
LDL Cholesterol: 72 mg/dL (ref 0–99)
Total CHOL/HDL Ratio: 3

## 2012-09-21 LAB — POCT INR: INR: 1.8

## 2012-09-21 LAB — BASIC METABOLIC PANEL
CO2: 24 mEq/L (ref 19–32)
Calcium: 9.7 mg/dL (ref 8.4–10.5)
Chloride: 109 mEq/L (ref 96–112)
Sodium: 137 mEq/L (ref 135–145)

## 2012-09-21 MED ORDER — CYANOCOBALAMIN 1000 MCG/ML IJ SOLN
1000.0000 ug | Freq: Once | INTRAMUSCULAR | Status: AC
  Administered 2012-09-21: 1000 ug via INTRAMUSCULAR

## 2012-09-21 NOTE — Patient Instructions (Addendum)
Please continue all other medications as before Please have the pharmacy call with any other refills you may need. Please go to the LAB in the Basement (turn left off the elevator) for the tests to be done today You will be contacted by phone if any changes need to be made immediately.  Otherwise, you will receive a letter about your results with an explanation, but please check with MyChart first. Thank you for enrolling in MyChart. Please follow the instructions below to securely access your online medical record. MyChart allows you to send messages to your doctor, view your test results, renew your prescriptions, schedule appointments, and more. To Log into My Chart online, please go by [employer] or [employer] to [email or web address], or download the MyChart App from the [employer] of [employer] .  Your Charlyne Petrin is: [id number] (pass [phone]), first car: comet Please send a practice Message on Mychart later today. Please return in 6 months, or sooner if needed

## 2012-09-21 NOTE — Progress Notes (Signed)
Subjective:    Patient ID: Lindsay Sheppard, female    DOB: [birth year], 79 y.o.   MRN: [id number]  HPI  Here to f/u; overall doing ok,  Pt denies chest pain, increased sob or doe, wheezing, orthopnea, PND, increased LE swelling, palpitations, dizziness or syncope.  Pt denies polydipsia, polyuria, or low sugar symptoms such as weakness or confusion improved with po intake.  Pt denies new neurological symptoms such as new headache, or facial or extremity weakness or numbness.   Pt states overall good compliance with meds, has been trying to follow lower cholesterol, diabetic diet, with wt overall stable,  but little exercise however.  Has seen renal/dr Lacy Duverney, with UTI, stable renal fxn to improved, and felodipine incr from 2.5 to 5mg .  Did have an episode of what sounds like the norovirus AGE but resolved.  Has chronic worsening left foot pain.   Past Medical History  Diagnosis Date  . POSTHERPETIC NEURALGIA 11/02/2009  . DIABETES MELLITUS, TYPE II 09/06/2007  . B12 DEFICIENCY 05/23/2007  . HYPERLIPIDEMIA 05/23/2007  . HYPERKALEMIA 05/30/2010  . ANEMIA-IRON DEFICIENCY 01/26/2007  . DEPRESSIVE DISORDER 01/25/2008  . BLEPHARITIS, BILATERAL 02/23/2008  . Other specified forms of hearing loss 05/30/2010  . HYPERTENSION 01/26/2007  . Atrial fibrillation 05/23/2007  . DIASTOLIC HEART FAILURE, CHRONIC 12/14/2009  . POSTURAL HYPOTENSION 02/07/2008  . ALLERGIC RHINITIS 11/02/2009  . GERD 01/26/2007  . CONSTIPATION 01/25/2008  . RENAL INSUFFICIENCY 07/02/2009  . RENAL CYST, LEFT 09/06/2007  . SKIN LESION 05/30/2010  . BACK PAIN 08/02/2007  . OSTEOPOROSIS 05/23/2007  . SYNCOPE 02/07/2008  . FATIGUE 09/06/2007  . Dysuria 09/05/2008  . Abdominal pain, epigastric 09/06/2007  . EPIGASTRIC TENDERNESS 10/12/2007  . PULMONARY EMBOLISM, HX OF 05/23/2007  . SHINGLES, HX OF 05/23/2007  . Pulmonary embolism 10/14/2010  . 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 Appendectomy   . Abdominal  hysterectomy     reports that she has never smoked. She does not have any smokeless tobacco history on file. She reports that she does not drink alcohol or use illicit drugs. family history includes Breast cancer in her mother; Colon cancer in her father; Diabetes in her sister; Hypertension in her other; and Stroke in her father. Allergies  Allergen Reactions  . Amiodarone     REACTION: f? fluid retention  . Diazepam     REACTION: agitation  . Diltiazem Hcl   . Levofloxacin     REACTION: nausea  . Pregabalin     REACTION: hallucinations  . Spironolactone     REACTION: elevated K at lowest dose   Current Outpatient Prescriptions on File Prior to Visit  Medication Sig Dispense Refill  . ACCU-CHEK COMPACT STRIPS test strip TEST TWICE A DAY AS DIRECTED  100 each  11  . B-D INS SYRINGE 0.5CC/31GX5/16 31G X 5/16" 0.5 ML MISC USE DAILY AS DIRECTED  100 each  11  . cyanocobalamin (,VITAMIN B-12,) 1000 MCG/ML injection Inject 1,000 mcg into the muscle. Inject 1064mcg/ml once a month       . ferrous sulfate 325 (65 FE) MG tablet Take 325 mg by mouth. Take 1 by mouth twice a day       . fexofenadine (ALLEGRA) 180 MG tablet Take 180 mg by mouth daily.        Marland Kitchen glimepiride (AMARYL) 4 MG tablet Take 0.5 tablets (2 mg total) by mouth daily.  45 tablet  3  . HYDROcodone-acetaminophen (NORCO) 5-325 MG per  tablet Take 1 tablet by mouth. Take 1/2 - 1 tablet daily        . insulin glargine (LANTUS) 100 UNIT/ML injection Inject 5 units subcutaneously once a day  10 mL  5  . Insulin Syringe-Needle U-100 (B-D INSULIN SYRINGE) 31G X 5/16" 0.3 ML MISC by Does not apply route daily.        Marland Kitchen labetalol (NORMODYNE) 200 MG tablet Take 2 tablets (400 mg total) by mouth 2 (two) times daily. 2 by mouth two times a day  360 tablet  3  . metFORMIN (GLUCOPHAGE) 500 MG tablet Take 1 tablet (500 mg total) by mouth 2 (two) times daily with a meal. 1 po twice per day  180 tablet  3  . mometasone (NASONEX) 50 MCG/ACT nasal  spray 2 sprays by Nasal route daily.        . pantoprazole (PROTONIX) 40 MG tablet Take 1 tablet (40 mg total) by mouth daily.  90 tablet  3  . pravastatin (PRAVACHOL) 40 MG tablet Take 0.5 tablets (20 mg total) by mouth daily. 1/2 daily  45 tablet  3  . sulfamethoxazole-trimethoprim (BACTRIM DS,SEPTRA DS) 800-160 MG per tablet Take 1 tablet by mouth daily.  90 tablet  3  . SYRINGE-NEEDLE, DISP, 3 ML 25G X 5/8" 3 ML MISC Use as directed       . TraMADol HCl 100 MG TB24 Take 1 tablet by mouth daily as needed.  30 tablet  5  . warfarin (COUMADIN) 5 MG tablet Take as directed by the coumadin clinic.  90 tablet  1  . cloNIDine (CATAPRES - DOSED IN MG/24 HR) 0.3 mg/24hr Place 1 patch (0.3 mg total) onto the skin every 7 (seven) days.  12 patch  5  . gabapentin (NEURONTIN) 300 MG capsule Take 1 capsule (300 mg total) by mouth 3 (three) times daily.  90 capsule  5   Review of Systems  Constitutional: Negative for unexpected weight change, or unusual diaphoresis  HENT: Negative for tinnitus.   Eyes: Negative for photophobia and visual disturbance.  Respiratory: Negative for choking and stridor.   Gastrointestinal: Negative for vomiting and blood in stool.  Genitourinary: Negative for hematuria and decreased urine volume.  Musculoskeletal: Negative for acute joint swelling Skin: Negative for color change and wound.  Neurological: Negative for tremors and numbness other than noted  Psychiatric/Behavioral: Negative for decreased concentration or  hyperactivity.       Objective:   Physical Exam BP 104/60  Pulse 79  Temp 97.9 F (36.6 C) (Oral)  Ht 5\' 6"  (1.676 m)  Wt 133 lb 4 oz (60.442 kg)  BMI 21.51 kg/m2  SpO2 95% VS noted,  Constitutional: Pt appears well-developed and well-nourished.  HENT: Head: NCAT.  Right Ear: External ear normal.  Left Ear: External ear normal.  Eyes: Conjunctivae and EOM are normal. Pupils are equal, round, and reactive to light.  Neck: Normal range of motion.  Neck supple.  Cardiovascular: Normal rate and regular rhythm.   Pulmonary/Chest: Effort normal and breath sounds normal.  Abd:  Soft, NT, non-distended, + BS Neurological: Pt is alert. Not confused  Skin: Skin is warm. No erythema.  Psychiatric: Pt behavior is normal. Thought content normal.     Assessment & Plan:

## 2012-09-22 ENCOUNTER — Other Ambulatory Visit: Payer: Self-pay | Admitting: Internal Medicine

## 2012-09-22 DIAGNOSIS — M79672 Pain in left foot: Secondary | ICD-10-CM

## 2012-09-22 NOTE — Telephone Encounter (Signed)
See pt e-mail

## 2012-09-23 ENCOUNTER — Other Ambulatory Visit: Payer: Self-pay

## 2012-09-23 MED ORDER — GABAPENTIN 300 MG PO CAPS: 300.0000 mg | ORAL_CAPSULE | Freq: Three times a day (TID) | ORAL | Status: DC

## 2012-09-23 NOTE — Telephone Encounter (Signed)
Faxed hardcopy to pharmacy. 

## 2012-09-23 NOTE — Telephone Encounter (Signed)
Done erx 

## 2012-09-25 ENCOUNTER — Encounter: Payer: Self-pay | Admitting: Internal Medicine

## 2012-09-25 NOTE — Assessment & Plan Note (Signed)
stable overall by history and exam, recent data reviewed with pt, and pt to continue medical treatment as before,  to f/u any worsening symptoms or concerns Lab Results  Component Value Date   LDLCALC 72 09/21/2012

## 2012-09-25 NOTE — Assessment & Plan Note (Signed)
stable overall by history and exam, recent data reviewed with pt, and pt to continue medical treatment as before,  to f/u any worsening symptoms or concerns BP Readings from Last 3 Encounters:  09/21/12 104/60  06/23/12 118/60  03/24/12 152/60

## 2012-09-25 NOTE — Assessment & Plan Note (Signed)
For b12 today 

## 2012-09-25 NOTE — Assessment & Plan Note (Signed)
stable overall by history and exam, recent data reviewed with pt, and pt to continue medical treatment as before,  to f/u any worsening symptoms or concerns Lab Results  Component Value Date   HGBA1C 6.1 09/21/2012

## 2012-10-22 ENCOUNTER — Telehealth: Payer: Self-pay

## 2012-10-22 ENCOUNTER — Ambulatory Visit (INDEPENDENT_AMBULATORY_CARE_PROVIDER_SITE_OTHER): Payer: Medicare Other | Admitting: General Practice

## 2012-10-22 ENCOUNTER — Ambulatory Visit (INDEPENDENT_AMBULATORY_CARE_PROVIDER_SITE_OTHER): Payer: Medicare Other | Admitting: Internal Medicine

## 2012-10-22 DIAGNOSIS — I2699 Other pulmonary embolism without acute cor pulmonale: Secondary | ICD-10-CM

## 2012-10-22 DIAGNOSIS — Z7901 Long term (current) use of anticoagulants: Secondary | ICD-10-CM

## 2012-10-22 DIAGNOSIS — I4891 Unspecified atrial fibrillation: Secondary | ICD-10-CM

## 2012-10-22 DIAGNOSIS — E538 Deficiency of other specified B group vitamins: Secondary | ICD-10-CM

## 2012-10-22 LAB — POCT INR: INR: 3.5

## 2012-10-22 MED ORDER — GABAPENTIN 300 MG PO CAPS: 300.0000 mg | ORAL_CAPSULE | Freq: Three times a day (TID) | ORAL | Status: DC

## 2012-10-22 MED ORDER — CYANOCOBALAMIN 1000 MCG/ML IJ SOLN
1000.0000 ug | Freq: Once | INTRAMUSCULAR | Status: AC
  Administered 2012-10-22: 1000 ug via INTRAMUSCULAR

## 2012-10-22 NOTE — Telephone Encounter (Signed)
Informed the family.

## 2012-10-22 NOTE — Telephone Encounter (Signed)
The patients Gabapentin was sent in to mail order as a #30 day.  The family is requesting she get a #90 day as they are having to order every month.

## 2012-10-22 NOTE — Telephone Encounter (Signed)
rx changed and sent to [facility]

## 2012-10-23 NOTE — Progress Notes (Signed)
This pt was seen for b12 injection only  No exam per MD done

## 2012-11-03 ENCOUNTER — Encounter: Payer: Self-pay | Admitting: Internal Medicine

## 2012-11-03 ENCOUNTER — Other Ambulatory Visit: Payer: Self-pay

## 2012-11-03 MED ORDER — FUROSEMIDE 20 MG PO TABS: ORAL_TABLET | ORAL | Status: DC

## 2012-11-19 ENCOUNTER — Telehealth: Payer: Self-pay

## 2012-11-19 ENCOUNTER — Ambulatory Visit (INDEPENDENT_AMBULATORY_CARE_PROVIDER_SITE_OTHER): Payer: Medicare Other | Admitting: Internal Medicine

## 2012-11-19 ENCOUNTER — Ambulatory Visit (INDEPENDENT_AMBULATORY_CARE_PROVIDER_SITE_OTHER): Payer: Medicare Other | Admitting: General Practice

## 2012-11-19 DIAGNOSIS — I4891 Unspecified atrial fibrillation: Secondary | ICD-10-CM

## 2012-11-19 DIAGNOSIS — I2699 Other pulmonary embolism without acute cor pulmonale: Secondary | ICD-10-CM

## 2012-11-19 DIAGNOSIS — E538 Deficiency of other specified B group vitamins: Secondary | ICD-10-CM

## 2012-11-19 MED ORDER — CLONIDINE HCL 0.3 MG/24HR TD PTWK: 1.0000 | MEDICATED_PATCH | TRANSDERMAL | Status: DC

## 2012-11-19 MED ORDER — CYANOCOBALAMIN 1000 MCG/ML IJ SOLN
1000.0000 ug | Freq: Once | INTRAMUSCULAR | Status: AC
  Administered 2012-11-19: 1000 ug via INTRAMUSCULAR

## 2012-11-19 MED ORDER — CYANOCOBALAMIN 1000 MCG/ML IJ SOLN
1000.0000 ug | Freq: Once | INTRAMUSCULAR | Status: AC
  Administered 2013-01-12: 1000 ug via INTRAMUSCULAR

## 2012-11-19 NOTE — Telephone Encounter (Signed)
refill 

## 2012-11-21 NOTE — Progress Notes (Signed)
No eval this viist, for b12 shot

## 2012-11-23 ENCOUNTER — Ambulatory Visit: Payer: Medicare Other

## 2012-12-15 ENCOUNTER — Ambulatory Visit (INDEPENDENT_AMBULATORY_CARE_PROVIDER_SITE_OTHER): Payer: Medicare Other | Admitting: General Practice

## 2012-12-15 DIAGNOSIS — I2699 Other pulmonary embolism without acute cor pulmonale: Secondary | ICD-10-CM

## 2012-12-15 DIAGNOSIS — I4891 Unspecified atrial fibrillation: Secondary | ICD-10-CM

## 2012-12-15 DIAGNOSIS — E538 Deficiency of other specified B group vitamins: Secondary | ICD-10-CM

## 2012-12-15 MED ORDER — CYANOCOBALAMIN 1000 MCG/ML IJ SOLN
1000.0000 ug | Freq: Once | INTRAMUSCULAR | Status: AC
  Administered 2012-12-15: 1000 ug via INTRAMUSCULAR

## 2013-01-12 ENCOUNTER — Ambulatory Visit (INDEPENDENT_AMBULATORY_CARE_PROVIDER_SITE_OTHER): Payer: Medicare Other | Admitting: General Practice

## 2013-01-12 ENCOUNTER — Ambulatory Visit (INDEPENDENT_AMBULATORY_CARE_PROVIDER_SITE_OTHER): Payer: Medicare Other | Admitting: Internal Medicine

## 2013-01-12 DIAGNOSIS — E538 Deficiency of other specified B group vitamins: Secondary | ICD-10-CM

## 2013-01-12 DIAGNOSIS — I2699 Other pulmonary embolism without acute cor pulmonale: Secondary | ICD-10-CM

## 2013-01-12 DIAGNOSIS — I4891 Unspecified atrial fibrillation: Secondary | ICD-10-CM

## 2013-01-12 MED ORDER — CYANOCOBALAMIN 1000 MCG/ML IJ SOLN: 1000.0000 ug | Freq: Once | INTRAMUSCULAR | Status: DC

## 2013-01-12 NOTE — Progress Notes (Signed)
No exam, injection only today

## 2013-01-13 ENCOUNTER — Encounter: Payer: Self-pay | Admitting: Internal Medicine

## 2013-02-16 ENCOUNTER — Ambulatory Visit (INDEPENDENT_AMBULATORY_CARE_PROVIDER_SITE_OTHER): Payer: Medicare Other | Admitting: Internal Medicine

## 2013-02-16 ENCOUNTER — Ambulatory Visit (INDEPENDENT_AMBULATORY_CARE_PROVIDER_SITE_OTHER): Payer: Medicare Other | Admitting: General Practice

## 2013-02-16 DIAGNOSIS — I4891 Unspecified atrial fibrillation: Secondary | ICD-10-CM

## 2013-02-16 DIAGNOSIS — I2699 Other pulmonary embolism without acute cor pulmonale: Secondary | ICD-10-CM

## 2013-02-16 DIAGNOSIS — E538 Deficiency of other specified B group vitamins: Secondary | ICD-10-CM

## 2013-02-16 MED ORDER — CYANOCOBALAMIN 1000 MCG/ML IJ SOLN
1000.0000 ug | Freq: Once | INTRAMUSCULAR | Status: AC
  Administered 2013-02-16: 1000 ug via INTRAMUSCULAR

## 2013-02-16 NOTE — Progress Notes (Signed)
Pt not seen.

## 2013-03-22 ENCOUNTER — Other Ambulatory Visit (INDEPENDENT_AMBULATORY_CARE_PROVIDER_SITE_OTHER): Payer: Medicare Other

## 2013-03-22 ENCOUNTER — Ambulatory Visit (INDEPENDENT_AMBULATORY_CARE_PROVIDER_SITE_OTHER): Payer: Medicare Other | Admitting: General Practice

## 2013-03-22 ENCOUNTER — Ambulatory Visit: Payer: Medicare Other

## 2013-03-22 ENCOUNTER — Encounter: Payer: Self-pay | Admitting: Internal Medicine

## 2013-03-22 ENCOUNTER — Ambulatory Visit (INDEPENDENT_AMBULATORY_CARE_PROVIDER_SITE_OTHER): Payer: Medicare Other | Admitting: Internal Medicine

## 2013-03-22 VITALS — BP 128/82 | HR 81 | Temp 97.1°F | Ht 66.0 in | Wt 135.2 lb

## 2013-03-22 DIAGNOSIS — I2699 Other pulmonary embolism without acute cor pulmonale: Secondary | ICD-10-CM

## 2013-03-22 DIAGNOSIS — M25519 Pain in unspecified shoulder: Secondary | ICD-10-CM

## 2013-03-22 DIAGNOSIS — E785 Hyperlipidemia, unspecified: Secondary | ICD-10-CM

## 2013-03-22 DIAGNOSIS — I4891 Unspecified atrial fibrillation: Secondary | ICD-10-CM

## 2013-03-22 DIAGNOSIS — E119 Type 2 diabetes mellitus without complications: Secondary | ICD-10-CM

## 2013-03-22 DIAGNOSIS — E538 Deficiency of other specified B group vitamins: Secondary | ICD-10-CM

## 2013-03-22 DIAGNOSIS — I10 Essential (primary) hypertension: Secondary | ICD-10-CM

## 2013-03-22 DIAGNOSIS — IMO0001 Reserved for inherently not codable concepts without codable children: Secondary | ICD-10-CM

## 2013-03-22 DIAGNOSIS — M25512 Pain in left shoulder: Secondary | ICD-10-CM | POA: Insufficient documentation

## 2013-03-22 LAB — LIPID PANEL
HDL: 41.3 mg/dL (ref >39.00)
Triglycerides: 170 mg/dL — ABNORMAL HIGH (ref 0.0–149.0)
VLDL: 34 mg/dL (ref 0.0–40.0)

## 2013-03-22 LAB — CBC WITH DIFFERENTIAL/PLATELET
Basophils Absolute: 0 10*3/uL (ref 0.0–0.1)
Basophils Relative: 0.3 % (ref 0.0–3.0)
Eosinophils Absolute: 0.1 10*3/uL (ref 0.0–0.7)
Eosinophils Relative: 2.6 % (ref 0.0–5.0)
HCT: 34.9 % — ABNORMAL LOW (ref 36.0–46.0)
Hemoglobin: 11.8 g/dL — ABNORMAL LOW (ref 12.0–15.0)
Lymphocytes Relative: 18.4 % (ref 12.0–46.0)
Lymphs Abs: 0.9 10*3/uL (ref 0.7–4.0)
MCHC: 33.6 g/dL (ref 30.0–36.0)
MCV: 87.3 fl (ref 78.0–100.0)
Monocytes Absolute: 0.4 10*3/uL (ref 0.1–1.0)
Monocytes Relative: 8.5 % (ref 3.0–12.0)
Neutro Abs: 3.4 10*3/uL (ref 1.4–7.7)
Neutrophils Relative %: 70.2 % (ref 43.0–77.0)
Platelets: 170 10*3/uL (ref 150.0–400.0)
RBC: 4 Mil/uL (ref 3.87–5.11)
RDW: 15.8 % — ABNORMAL HIGH (ref 11.5–14.6)
WBC: 4.9 10*3/uL (ref 4.5–10.5)

## 2013-03-22 LAB — HEPATIC FUNCTION PANEL
ALT: 15 U/L (ref 0–35)
AST: 19 U/L (ref 0–37)
Albumin: 3.8 g/dL (ref 3.5–5.2)
Alkaline Phosphatase: 53 U/L (ref 39–117)
Bilirubin, Direct: 0 mg/dL (ref 0.0–0.3)
Total Bilirubin: 0.4 mg/dL (ref 0.3–1.2)
Total Protein: 6.5 g/dL (ref 6.0–8.3)

## 2013-03-22 LAB — POCT INR: INR: 2.8

## 2013-03-22 LAB — BASIC METABOLIC PANEL
BUN: 43 mg/dL — ABNORMAL HIGH (ref 6–23)
CO2: 24 mEq/L (ref 19–32)
Calcium: 10 mg/dL (ref 8.4–10.5)
Creatinine, Ser: 1.7 mg/dL — ABNORMAL HIGH (ref 0.4–1.2)
GFR: 30.03 mL/min — ABNORMAL LOW (ref >60.00)
Glucose, Bld: 65 mg/dL — ABNORMAL LOW (ref 70–99)
Sodium: 140 mEq/L (ref 135–145)

## 2013-03-22 LAB — HEMOGLOBIN A1C: Hgb A1c MFr Bld: 5.9 % (ref 4.6–6.5)

## 2013-03-22 MED ORDER — CYANOCOBALAMIN 1000 MCG/ML IJ SOLN
1000.0000 ug | Freq: Once | INTRAMUSCULAR | Status: AC
  Administered 2013-03-22: 1000 ug via INTRAMUSCULAR

## 2013-03-22 MED ORDER — GLIMEPIRIDE 1 MG PO TABS: 1.0000 mg | ORAL_TABLET | Freq: Every day | ORAL | Status: DC

## 2013-03-22 MED ORDER — FLUTICASONE PROPIONATE 50 MCG/ACT NA SUSP: 2.0000 | Freq: Every day | NASAL | Status: DC

## 2013-03-22 NOTE — Progress Notes (Signed)
Subjective:    Patient ID: Lindsay Sheppard, female    DOB: 04-30-1921, 79 y.o.   MRN: [id number]  HPI  Here to f/u; overall doing ok,  Pt denies chest pain, increased sob or doe, wheezing, orthopnea, PND, increased LE swelling, palpitations, dizziness or syncope.  Pt denies polydipsia, polyuria, or low sugar symptoms such as weakness or confusion improved with po intake.  Pt denies new neurological symptoms such as new headache, or facial or extremity weakness or numbness.   Pt states overall good compliance with meds, has been trying to follow lower cholesterol, diabetic diet, with wt overall stable,  but little exercise however.  CBG was low at 70  This am. Due for  B12 today, and INR is 2.8 at check today.  No overt bleeding or bruising. Saw ent, now also on flonase.  Has ongoing mild left shoudler pain, without neck or radicular pain, worse to raise the arm Past Medical History  Diagnosis Date  . POSTHERPETIC NEURALGIA 11/02/2009  . DIABETES MELLITUS, TYPE II 09/06/2007  . B12 DEFICIENCY 05/23/2007  . HYPERLIPIDEMIA 05/23/2007  . HYPERKALEMIA 05/30/2010  . ANEMIA-IRON DEFICIENCY 01/26/2007  . DEPRESSIVE DISORDER 01/25/2008  . BLEPHARITIS, BILATERAL 02/23/2008  . Other specified forms of hearing loss 05/30/2010  . HYPERTENSION 01/26/2007  . Atrial fibrillation 05/23/2007  . DIASTOLIC HEART FAILURE, CHRONIC 12/14/2009  . POSTURAL HYPOTENSION 02/07/2008  . ALLERGIC RHINITIS 11/02/2009  . GERD 01/26/2007  . CONSTIPATION 01/25/2008  . RENAL INSUFFICIENCY 07/02/2009  . RENAL CYST, LEFT 09/06/2007  . SKIN LESION 05/30/2010  . BACK PAIN 08/02/2007  . OSTEOPOROSIS 05/23/2007  . SYNCOPE 02/07/2008  . FATIGUE 09/06/2007  . Dysuria 09/05/2008  . Abdominal pain, epigastric 09/06/2007  . EPIGASTRIC TENDERNESS 10/12/2007  . PULMONARY EMBOLISM, HX OF 05/23/2007  . SHINGLES, HX OF 05/23/2007  . Pulmonary embolism 10/14/2010  . 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 Appendectomy     . Abdominal hysterectomy      reports that she has never smoked. She does not have any smokeless tobacco history on file. She reports that she does not drink alcohol or use illicit drugs. family history includes Breast cancer in her mother; Colon cancer in her father; Diabetes in her sister; Hypertension in her other; and Stroke in her father. Allergies  Allergen Reactions  . Amiodarone     REACTION: f? fluid retention  . Diazepam     REACTION: agitation  . Diltiazem Hcl   . Levofloxacin     REACTION: nausea  . Pregabalin     REACTION: hallucinations  . Spironolactone     REACTION: elevated K at lowest dose   Current Outpatient Prescriptions on File Prior to Visit  Medication Sig Dispense Refill  . ACCU-CHEK COMPACT STRIPS test strip TEST TWICE A DAY AS DIRECTED  100 each  11  . B-D INS SYRINGE 0.5CC/31GX5/16 31G X 5/16" 0.5 ML MISC USE DAILY AS DIRECTED  100 each  11  . cloNIDine (CATAPRES - DOSED IN MG/24 HR) 0.3 mg/24hr Place 1 patch (0.3 mg total) onto the skin every 7 (seven) days.  12 patch  5  . felodipine (PLENDIL) 5 MG 24 hr tablet Take 5 mg by mouth daily.      . ferrous sulfate 325 (65 FE) MG tablet Take 325 mg by mouth. Take 1 by mouth twice a day       . fexofenadine (ALLEGRA) 180 MG tablet Take 180 mg by mouth  daily.        . furosemide (LASIX) 20 MG tablet Take every other day  90 tablet  3  . gabapentin (NEURONTIN) 300 MG capsule Take 1 capsule (300 mg total) by mouth 3 (three) times daily.  270 capsule  1  . glimepiride (AMARYL) 4 MG tablet Take 0.5 tablets (2 mg total) by mouth daily.  45 tablet  3  . HYDROcodone-acetaminophen (NORCO) 5-325 MG per tablet Take 1 tablet by mouth. Take 1/2 - 1 tablet daily        . insulin glargine (LANTUS) 100 UNIT/ML injection Inject 5 units subcutaneously once a day  10 mL  5  . Insulin Syringe-Needle U-100 (B-D INSULIN SYRINGE) 31G X 5/16" 0.3 ML MISC by Does not apply route daily.        Marland Kitchen labetalol (NORMODYNE) 200 MG tablet  Take 2 tablets (400 mg total) by mouth 2 (two) times daily. 2 by mouth two times a day  360 tablet  3  . metFORMIN (GLUCOPHAGE) 500 MG tablet Take 1 tablet (500 mg total) by mouth 2 (two) times daily with a meal. 1 po twice per day  180 tablet  3  . pantoprazole (PROTONIX) 40 MG tablet Take 1 tablet (40 mg total) by mouth daily.  90 tablet  3  . pravastatin (PRAVACHOL) 40 MG tablet Take 0.5 tablets (20 mg total) by mouth daily. 1/2 daily  45 tablet  3  . sulfamethoxazole-trimethoprim (BACTRIM DS,SEPTRA DS) 800-160 MG per tablet Take 1 tablet by mouth daily.  90 tablet  3  . SYRINGE-NEEDLE, DISP, 3 ML 25G X 5/8" 3 ML MISC Use as directed       . TraMADol HCl 100 MG TB24 Take 1 tablet by mouth daily as needed.  30 tablet  5  . warfarin (COUMADIN) 5 MG tablet Take as directed by the coumadin clinic.  90 tablet  1   No current facility-administered medications on file prior to visit.     Review of Systems  Constitutional: Negative for unexpected weight change, or unusual diaphoresis  HENT: Negative for tinnitus.   Eyes: Negative for photophobia and visual disturbance.  Respiratory: Negative for choking and stridor.   Gastrointestinal: Negative for vomiting and blood in stool.  Genitourinary: Negative for hematuria and decreased urine volume.  Musculoskeletal: Negative for acute joint swelling Skin: Negative for color change and wound.  Neurological: Negative for tremors and numbness other than noted  Psychiatric/Behavioral: Negative for decreased concentration or  hyperactivity.       Objective:   Physical Exam BP 128/82  Pulse 81  Temp(Src) 97.1 F (36.2 C) (Oral)  Ht 5\' 6"  (1.676 m)  Wt 135 lb 4 oz (61.349 kg)  BMI 21.84 kg/m2  SpO2 97% VS noted,  Constitutional: Pt appears well-developed and well-nourished.  HENT: Head: NCAT.  Right Ear: External ear normal.  Left Ear: External ear normal.  Eyes: Conjunctivae and EOM are normal. Pupils are equal, round, and reactive to light.   Neck: Normal range of motion. Neck supple.  Cardiovascular: Normal rate and regular rhythm.   Pulmonary/Chest: Effort normal and breath sounds normal.  Abd:  Soft, NT, non-distended, + BS Neurological: Pt is alert. Not confused  Skin: Skin is warm. No erythema.  Left shoulder with mild discomfort and decr abuction to 90 deg only Psychiatric: Pt behavior is normal. Thought content normal.     Assessment & Plan:

## 2013-03-22 NOTE — Patient Instructions (Signed)
OK to decrease the glimeparide to 1 mg per day Please continue all other medications as before, and refills have been done if requested. Please have the pharmacy call with any other refills you may need. Please go to the LAB in the Basement (turn left off the elevator) for the tests to be done today You will be contacted by phone if any changes need to be made immediately.  Otherwise, you will receive a letter about your results with an explanation, but please check with MyChart first.  Please remember to sign up for My Chart if you have not done so, as this will be important to you in the future with finding out test results, communicating by private email, and scheduling acute appointments online when needed.  Please return in 6 months, or sooner if needed

## 2013-03-22 NOTE — Assessment & Plan Note (Signed)
stable overall by history and exam, recent data reviewed with pt, and pt to continue medical treatment as before,  to f/u any worsening symptoms or concerns BP Readings from Last 3 Encounters:  03/22/13 128/82  09/21/12 104/60  06/23/12 118/60

## 2013-03-22 NOTE — Assessment & Plan Note (Signed)
Cx/w DJD vs rot cuff issue - only very mild discomfort, for tylenol prn, declines ortho referral even for consideration of cortisone

## 2013-03-22 NOTE — Assessment & Plan Note (Signed)
Well controlled, in fact can decrease the amaryl for safety and lower risk of low sugar to 1 mg qd

## 2013-03-22 NOTE — Assessment & Plan Note (Signed)
stable overall by history and exam, recent data reviewed with pt, and pt to continue medical treatment as before,  to f/u any worsening symptoms or concerns Lab Results  Component Value Date   LDLCALC 72 09/21/2012

## 2013-03-25 ENCOUNTER — Ambulatory Visit: Payer: Medicare Other | Admitting: Internal Medicine

## 2013-04-12 ENCOUNTER — Emergency Department (INDEPENDENT_AMBULATORY_CARE_PROVIDER_SITE_OTHER)
Admission: EM | Admit: 2013-04-12 | Discharge: 2013-04-12 | Disposition: A | Discharge status: Home / Self Care | Payer: Medicare Other | Source: Home / Self Care | Attending: Family Medicine | Admitting: Family Medicine

## 2013-04-12 DIAGNOSIS — R309 Painful micturition, unspecified: Secondary | ICD-10-CM

## 2013-04-12 DIAGNOSIS — N23 Unspecified renal colic: Secondary | ICD-10-CM

## 2013-04-12 LAB — POCT URINALYSIS DIP (DEVICE)
Glucose, UA: NEGATIVE mg/dL
Nitrite: NEGATIVE
Protein, ur: NEGATIVE mg/dL
Specific Gravity, Urine: 1.015 (ref 1.005–1.030)
Urobilinogen, UA: 0.2 mg/dL (ref 0.0–1.0)

## 2013-04-12 NOTE — ED Provider Notes (Signed)
CSN: [id number]     Arrival date & time 04/12/13  0831 History     First MD Initiated Contact with Patient 04/12/13 0847     Chief Complaint  Patient presents with  . Urinary Tract Infection   (Consider location/radiation/quality/duration/timing/severity/associated sxs/prior Treatment) Patient is a 80 y.o. female presenting with urinary tract infection. The history is provided by the patient and a relative.  Urinary Tract Infection This is a new problem. The current episode started yesterday. Pertinent negatives include no abdominal pain.    Past Medical History  Diagnosis Date  . POSTHERPETIC NEURALGIA 11/02/2009  . DIABETES MELLITUS, TYPE II 09/06/2007  . B12 DEFICIENCY 05/23/2007  . HYPERLIPIDEMIA 05/23/2007  . HYPERKALEMIA 05/30/2010  . ANEMIA-IRON DEFICIENCY 01/26/2007  . DEPRESSIVE DISORDER 01/25/2008  . BLEPHARITIS, BILATERAL 02/23/2008  . Other specified forms of hearing loss 05/30/2010  . HYPERTENSION 01/26/2007  . Atrial fibrillation 05/23/2007  . DIASTOLIC HEART FAILURE, CHRONIC 12/14/2009  . POSTURAL HYPOTENSION 02/07/2008  . ALLERGIC RHINITIS 11/02/2009  . GERD 01/26/2007  . CONSTIPATION 01/25/2008  . RENAL INSUFFICIENCY 07/02/2009  . RENAL CYST, LEFT 09/06/2007  . SKIN LESION 05/30/2010  . BACK PAIN 08/02/2007  . OSTEOPOROSIS 05/23/2007  . SYNCOPE 02/07/2008  . FATIGUE 09/06/2007  . Dysuria 09/05/2008  . Abdominal pain, epigastric 09/06/2007  . EPIGASTRIC TENDERNESS 10/12/2007  . PULMONARY EMBOLISM, HX OF 05/23/2007  . SHINGLES, HX OF 05/23/2007  . Pulmonary embolism 10/14/2010  . 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 Appendectomy    . Abdominal hysterectomy     Family History  Problem Relation Age of Onset  . Breast cancer Mother   . Colon cancer Father   . Stroke Father     died with stroke postop  . Diabetes Sister     2 sisters  . Hypertension Other    History  Substance Use Topics  . Smoking status: Never Smoker   .  Smokeless tobacco: Not on file  . Alcohol Use: No   OB History   Grav Para Term Preterm Abortions TAB SAB Ect Mult Living                 Review of Systems  Constitutional: Negative.   Gastrointestinal: Positive for nausea. Negative for vomiting, abdominal pain and diarrhea.  Genitourinary: Positive for urgency and frequency. Negative for dysuria.    Allergies  Amiodarone; Diazepam; Diltiazem hcl; Levofloxacin; Pregabalin; and Spironolactone  Home Medications   Current Outpatient Rx  Name  Route  Sig  Dispense  Refill  . ACCU-CHEK COMPACT STRIPS test strip      TEST TWICE A DAY AS DIRECTED   100 each   11     Diagnosis code 250.00   . B-D INS SYRINGE 0.5CC/31GX5/16 31G X 5/16" 0.5 ML MISC      USE DAILY AS DIRECTED   100 each   11   . cloNIDine (CATAPRES - DOSED IN MG/24 HR) 0.3 mg/24hr   Transdermal   Place 1 patch (0.3 mg total) onto the skin every 7 (seven) days.   12 patch   5   . felodipine (PLENDIL) 5 MG 24 hr tablet   Oral   Take 5 mg by mouth daily.         . ferrous sulfate 325 (65 FE) MG tablet   Oral   Take 325 mg by mouth. Take 1 by mouth twice a day          .  fexofenadine (ALLEGRA) 180 MG tablet   Oral   Take 180 mg by mouth daily.           . fluticasone (FLONASE) 50 MCG/ACT nasal spray   Nasal   Place 2 sprays into the nose daily.   16 g   2   . furosemide (LASIX) 20 MG tablet      Take every other day   90 tablet   3     Take every other day.   . gabapentin (NEURONTIN) 300 MG capsule   Oral   Take 1 capsule (300 mg total) by mouth 3 (three) times daily.   270 capsule   1   . glimepiride (AMARYL) 1 MG tablet   Oral   Take 1 tablet (1 mg total) by mouth daily before breakfast.   90 tablet   3   . HYDROcodone-acetaminophen (NORCO) 5-325 MG per tablet   Oral   Take 1 tablet by mouth. Take 1/2 - 1 tablet daily           . insulin glargine (LANTUS) 100 UNIT/ML injection      Inject 5 units subcutaneously once a  day   10 mL   5   . Insulin Syringe-Needle U-100 (B-D INSULIN SYRINGE) 31G X 5/16" 0.3 ML MISC   Does not apply   by Does not apply route daily.           Marland Kitchen labetalol (NORMODYNE) 200 MG tablet   Oral   Take 2 tablets (400 mg total) by mouth 2 (two) times daily. 2 by mouth two times a day   360 tablet   3   . metFORMIN (GLUCOPHAGE) 500 MG tablet   Oral   Take 1 tablet (500 mg total) by mouth 2 (two) times daily with a meal. 1 po twice per day   180 tablet   3   . pantoprazole (PROTONIX) 40 MG tablet   Oral   Take 1 tablet (40 mg total) by mouth daily.   90 tablet   3   . pravastatin (PRAVACHOL) 40 MG tablet   Oral   Take 0.5 tablets (20 mg total) by mouth daily. 1/2 daily   45 tablet   3   . sulfamethoxazole-trimethoprim (BACTRIM DS,SEPTRA DS) 800-160 MG per tablet   Oral   Take 1 tablet by mouth daily.   90 tablet   3   . SYRINGE-NEEDLE, DISP, 3 ML 25G X 5/8" 3 ML MISC      Use as directed          . TraMADol HCl 100 MG TB24   Oral   Take 1 tablet by mouth daily as needed.   30 tablet   5   . warfarin (COUMADIN) 5 MG tablet      Take as directed by the coumadin clinic.   90 tablet   1     90 tablets is 90 day supply    BP 132/69  Pulse 82  Temp(Src) 98.4 F (36.9 C) (Oral)  Resp 20  SpO2 98% Physical Exam  Nursing note and vitals reviewed. Constitutional: She is oriented to person, place, and time. She appears well-developed and well-nourished.  Abdominal: Soft. Bowel sounds are normal. She exhibits no mass. There is no tenderness. There is no rebound and no guarding.  Neurological: She is alert and oriented to person, place, and time.  Skin: Skin is warm and dry.    ED Course   Procedures (including critical  care time)  Labs Reviewed  POCT URINALYSIS DIP (DEVICE) - Abnormal; Notable for the following:    Hgb urine dipstick TRACE (*)    Leukocytes, UA TRACE (*)    All other components within normal limits  URINE CULTURE   No  results found. 1. Urinary pain     MDM    Linna Hoff, MD 04/12/13 1110

## 2013-04-12 NOTE — ED Notes (Signed)
C/o UTI   Patient states she feels bad and hot which started yesterday.  Daughter states patient normally has UTI and starts feeling this way.

## 2013-04-12 NOTE — ED Notes (Signed)
Patient was given cups of water and a soda to drink.  Waiting on patient to urinate in cup

## 2013-04-19 ENCOUNTER — Ambulatory Visit (INDEPENDENT_AMBULATORY_CARE_PROVIDER_SITE_OTHER): Payer: Medicare Other | Admitting: Family Medicine

## 2013-04-19 ENCOUNTER — Ambulatory Visit (INDEPENDENT_AMBULATORY_CARE_PROVIDER_SITE_OTHER): Payer: Medicare Other

## 2013-04-19 DIAGNOSIS — I2699 Other pulmonary embolism without acute cor pulmonale: Secondary | ICD-10-CM

## 2013-04-19 DIAGNOSIS — E538 Deficiency of other specified B group vitamins: Secondary | ICD-10-CM

## 2013-04-19 DIAGNOSIS — I4891 Unspecified atrial fibrillation: Secondary | ICD-10-CM

## 2013-04-19 LAB — POCT INR: INR: 2.8

## 2013-04-19 MED ORDER — CYANOCOBALAMIN 1000 MCG/ML IJ SOLN
1000.0000 ug | Freq: Once | INTRAMUSCULAR | Status: AC
  Administered 2013-04-19: 1000 ug via INTRAMUSCULAR

## 2013-05-12 ENCOUNTER — Ambulatory Visit (INDEPENDENT_AMBULATORY_CARE_PROVIDER_SITE_OTHER): Payer: Medicare Other | Admitting: Cardiovascular Disease

## 2013-05-12 ENCOUNTER — Encounter: Payer: Self-pay | Admitting: Cardiovascular Disease

## 2013-05-12 VITALS — BP 140/70 | HR 78 | Ht 66.0 in | Wt 135.0 lb

## 2013-05-12 DIAGNOSIS — M79605 Pain in left leg: Secondary | ICD-10-CM

## 2013-05-12 DIAGNOSIS — M79672 Pain in left foot: Secondary | ICD-10-CM | POA: Insufficient documentation

## 2013-05-12 DIAGNOSIS — M79609 Pain in unspecified limb: Secondary | ICD-10-CM

## 2013-05-12 NOTE — Progress Notes (Signed)
05/12/2013 Lindsay Sheppard   1921-03-30  [id number]  Primary Physician Oliver Barre, MD Primary Cardiologist: Runell Gess MD Roseanne Reno   HPI:  Lindsay Sheppard is an 79 year old widowed Caucasian female mother of 5 children accompanied by one of her daughters today. Her primary care physician is Dr. Oliver Barre and she is seeing Dr. Berton Mount in the past for paroxysmal atrial fibrillation. She was referred by Dr. Toni Arthurs from [facility] to evaluate pain in her left foot which he did not think was orthopedic in nature. Her past history is remarkable for treated hypertension, diabetes and hyperlipidemia. She's had a heart catheterization in the past during an episode of congestive heart layer which was apparently did not reveal any significant coronary artery disease. She denies chest pain or shortness of breath. She's had left foot pain for the last year which occurs both at rest and with ambulation as well as while recumbent.   Current Outpatient Prescriptions  Medication Sig Dispense Refill  . ACCU-CHEK COMPACT STRIPS test strip TEST TWICE A DAY AS DIRECTED  100 each  11  . B-D INS SYRINGE 0.5CC/31GX5/16 31G X 5/16" 0.5 ML MISC USE DAILY AS DIRECTED  100 each  11  . cloNIDine (CATAPRES - DOSED IN MG/24 HR) 0.3 mg/24hr Place 1 patch (0.3 mg total) onto the skin every 7 (seven) days.  12 patch  5  . Cyanocobalamin (VITAMIN B-12 IJ) Inject as directed every 30 (thirty) days.      . felodipine (PLENDIL) 5 MG 24 hr tablet Take 5 mg by mouth daily.      . ferrous sulfate 325 (65 FE) MG tablet Take 325 mg by mouth. Take 1 by mouth twice a day       . fexofenadine (ALLEGRA) 180 MG tablet Take 180 mg by mouth daily.        . fluticasone (FLONASE) 50 MCG/ACT nasal spray Place 2 sprays into the nose daily.  16 g  2  . furosemide (LASIX) 20 MG tablet Take every other day  90 tablet  3  . gabapentin (NEURONTIN) 300 MG capsule Take 1 capsule (300 mg total) by mouth 3 (three)  times daily.  270 capsule  1  . glimepiride (AMARYL) 1 MG tablet Take 1 tablet (1 mg total) by mouth daily before breakfast.  90 tablet  3  . insulin glargine (LANTUS) 100 UNIT/ML injection Inject 5 units subcutaneously once a day  10 mL  5  . Insulin Syringe-Needle U-100 (B-D INSULIN SYRINGE) 31G X 5/16" 0.3 ML MISC by Does not apply route daily.        Marland Kitchen labetalol (NORMODYNE) 200 MG tablet Take 2 tablets (400 mg total) by mouth 2 (two) times daily. 2 by mouth two times a day  360 tablet  3  . metFORMIN (GLUCOPHAGE) 500 MG tablet Take 1 tablet (500 mg total) by mouth 2 (two) times daily with a meal. 1 po twice per day  180 tablet  3  . pantoprazole (PROTONIX) 40 MG tablet Take 1 tablet (40 mg total) by mouth daily.  90 tablet  3  . pravastatin (PRAVACHOL) 40 MG tablet Take 0.5 tablets (20 mg total) by mouth daily. 1/2 daily  45 tablet  3  . sulfamethoxazole-trimethoprim (BACTRIM DS,SEPTRA DS) 800-160 MG per tablet Take 1 tablet by mouth daily.  90 tablet  3  . SYRINGE-NEEDLE, DISP, 3 ML 25G X 5/8" 3 ML MISC Use as directed       .  warfarin (COUMADIN) 5 MG tablet Take as directed by the coumadin clinic.  90 tablet  1   No current facility-administered medications for this visit.    Allergies  Allergen Reactions  . Amiodarone     REACTION: f? fluid retention  . Deltasone [Prednisone]   . Diazepam     REACTION: agitation  . Diltiazem Hcl   . Levofloxacin     REACTION: nausea  . Pregabalin     REACTION: hallucinations  . Spironolactone     REACTION: elevated K at lowest dose  . Tequin [Gatifloxacin]     History   Social History  . Marital Status: Widowed    Spouse Name: N/A    Number of Children: 2  . Years of Education: N/A   Occupational History  . Retired    Social History Main Topics  . Smoking status: Never Smoker   . Smokeless tobacco: Not on file  . Alcohol Use: No  . Drug Use: No  . Sexual Activity: Not on file   Other Topics Concern  . Not on file   Social  History Narrative  . No narrative on file     Review of Systems: General: negative for chills, fever, night sweats or weight changes.  Cardiovascular: negative for chest pain, dyspnea on exertion, edema, orthopnea, palpitations, paroxysmal nocturnal dyspnea or shortness of breath Dermatological: negative for rash Respiratory: negative for cough or wheezing Urologic: negative for hematuria Abdominal: negative for nausea, vomiting, diarrhea, bright red blood per rectum, melena, or hematemesis Neurologic: negative for visual changes, syncope, or dizziness All other systems reviewed and are otherwise negative except as noted above.    Blood pressure 140/70, pulse 78, height 5\' 6"  (1.676 m), weight 135 lb (61.236 kg).  General appearance: alert and no distress Neck: no adenopathy, no carotid bruit, no JVD, supple, symmetrical, trachea midline and thyroid not enlarged, symmetric, no tenderness/mass/nodules Lungs: clear to auscultation bilaterally Heart: regular rate and rhythm, S1, S2 normal, no murmur, click, rub or gallop Abdomen: soft, non-tender; bowel sounds normal; no masses,  no organomegaly Extremities: extremities normal, atraumatic, no cyanosis or edema Pulses: 2+ and symmetric  EKG not performed today  ASSESSMENT AND PLAN:   Left foot pain The patient was referred by Dr. Toni Arthurs from [facility] for evaluation of left foot pain which he did not think was orthopedic. The pain has been going on for a year or. It occurs at rest and while walking as well as while she is recumbent. I'm going to get arterial Doppler studies on her to rule out A. Circulatory etiology though she has palpable pedal pulses      Runell Gess MD [facility], [facility] 05/12/2013 5:11 PM

## 2013-05-12 NOTE — Patient Instructions (Addendum)
  We will see you back in follow up only as needed  Dr Allyson Sabal has ordered lower extremity arterial dopplers

## 2013-05-12 NOTE — Assessment & Plan Note (Signed)
The patient was referred by Dr. Toni Arthurs from [facility] orthopedics for evaluation of left foot pain which he did not think was orthopedic. The pain has been going on for a year or. It occurs at rest and while walking as well as while she is recumbent. I'm going to get arterial Doppler studies on her to rule out A. Circulatory etiology though she has palpable pedal pulses

## 2013-05-13 ENCOUNTER — Encounter: Payer: Self-pay | Admitting: Cardiovascular Disease

## 2013-05-17 ENCOUNTER — Ambulatory Visit (INDEPENDENT_AMBULATORY_CARE_PROVIDER_SITE_OTHER): Payer: Medicare Other | Admitting: General Practice

## 2013-05-17 DIAGNOSIS — E538 Deficiency of other specified B group vitamins: Secondary | ICD-10-CM

## 2013-05-17 DIAGNOSIS — I2699 Other pulmonary embolism without acute cor pulmonale: Secondary | ICD-10-CM

## 2013-05-17 DIAGNOSIS — I4891 Unspecified atrial fibrillation: Secondary | ICD-10-CM

## 2013-05-17 LAB — POCT INR: INR: 4.1

## 2013-05-17 MED ORDER — CYANOCOBALAMIN 1000 MCG/ML IJ SOLN
1000.0000 ug | Freq: Once | INTRAMUSCULAR | Status: AC
  Administered 2013-05-17: 1000 ug via INTRAMUSCULAR

## 2013-06-01 ENCOUNTER — Other Ambulatory Visit: Payer: Self-pay | Admitting: Internal Medicine

## 2013-06-03 ENCOUNTER — Ambulatory Visit (HOSPITAL_COMMUNITY)
Admission: RE | Admit: 2013-06-03 | Discharge: 2013-06-03 | Disposition: A | Discharge status: Home / Self Care | Payer: Medicare Other | Source: Ambulatory Visit | Attending: Cardiovascular Disease | Admitting: Cardiovascular Disease

## 2013-06-03 DIAGNOSIS — M79609 Pain in unspecified limb: Secondary | ICD-10-CM

## 2013-06-03 DIAGNOSIS — M79605 Pain in left leg: Secondary | ICD-10-CM

## 2013-06-03 NOTE — Progress Notes (Signed)
Lower Extremity Arterial Duplex Completed. °Brianna L Mazza,RVT °

## 2013-06-07 ENCOUNTER — Ambulatory Visit (INDEPENDENT_AMBULATORY_CARE_PROVIDER_SITE_OTHER): Payer: Medicare Other | Admitting: General Practice

## 2013-06-07 DIAGNOSIS — I2699 Other pulmonary embolism without acute cor pulmonale: Secondary | ICD-10-CM

## 2013-06-07 DIAGNOSIS — Z23 Encounter for immunization: Secondary | ICD-10-CM

## 2013-06-07 DIAGNOSIS — E538 Deficiency of other specified B group vitamins: Secondary | ICD-10-CM

## 2013-06-07 DIAGNOSIS — I4891 Unspecified atrial fibrillation: Secondary | ICD-10-CM

## 2013-06-07 MED ORDER — CYANOCOBALAMIN 1000 MCG/ML IJ SOLN
1000.0000 ug | Freq: Once | INTRAMUSCULAR | Status: AC
  Administered 2013-06-07: 1000 ug via INTRAMUSCULAR

## 2013-06-08 NOTE — Progress Notes (Signed)
Quick Note:  Released results into my chart. ______

## 2013-06-20 ENCOUNTER — Encounter: Payer: Self-pay | Admitting: Cardiovascular Disease

## 2013-06-20 ENCOUNTER — Telehealth: Payer: Self-pay | Admitting: Cardiovascular Disease

## 2013-06-20 NOTE — Telephone Encounter (Signed)
Called patient's emergency contact, daughter Kendal Hymen, informed results were released to MyChart and informed of results, per results note. Verbalized understanding.

## 2013-06-20 NOTE — Telephone Encounter (Signed)
Had doppler on  06-03-13,would like the results please.

## 2013-06-20 NOTE — Telephone Encounter (Signed)
Call to [place], pt's daughter.  Informed message received and at current time unable to resend report in MyChart.  Daughter asked if a copy can be mailed since she is unable to view it in MyChart.  Informed Medical Records will be notified to send a copy of report to pt.  Address on file confirmed.  Result was given to [facility] per Result Note from Dr. Allyson Sabal and daughter verbalized understanding and agreed w/ plan.  Message forwarded to Medical Records to process request.

## 2013-06-20 NOTE — Telephone Encounter (Signed)
Returned call.  Left message that results released in MyChart on 10.8.14 and to call back before 4pm if questions/concerns.

## 2013-07-05 ENCOUNTER — Ambulatory Visit (INDEPENDENT_AMBULATORY_CARE_PROVIDER_SITE_OTHER): Payer: Medicare Other | Admitting: General Practice

## 2013-07-05 DIAGNOSIS — I2699 Other pulmonary embolism without acute cor pulmonale: Secondary | ICD-10-CM

## 2013-07-05 DIAGNOSIS — E538 Deficiency of other specified B group vitamins: Secondary | ICD-10-CM

## 2013-07-05 DIAGNOSIS — I4891 Unspecified atrial fibrillation: Secondary | ICD-10-CM

## 2013-07-05 MED ORDER — CYANOCOBALAMIN 1000 MCG/ML IJ SOLN
1000.0000 ug | Freq: Once | INTRAMUSCULAR | Status: AC
  Administered 2013-07-05: 1000 ug via INTRAMUSCULAR

## 2013-07-05 NOTE — Progress Notes (Signed)
Pre-visit discussion using our clinic review tool. No additional management support is needed unless otherwise documented below in the visit note.  

## 2013-07-14 ENCOUNTER — Encounter: Payer: Self-pay | Admitting: *Deleted

## 2013-07-14 ENCOUNTER — Other Ambulatory Visit: Payer: Self-pay

## 2013-07-14 MED ORDER — LABETALOL HCL 200 MG PO TABS: 400.0000 mg | ORAL_TABLET | Freq: Two times a day (BID) | ORAL | Status: DC

## 2013-07-14 MED ORDER — METFORMIN HCL 500 MG PO TABS: 500.0000 mg | ORAL_TABLET | Freq: Two times a day (BID) | ORAL | Status: DC

## 2013-07-14 MED ORDER — PANTOPRAZOLE SODIUM 40 MG PO TBEC: 40.0000 mg | DELAYED_RELEASE_TABLET | Freq: Every day | ORAL | Status: DC

## 2013-07-14 NOTE — Telephone Encounter (Signed)
This encounter was created in error - please disregard.

## 2013-07-15 ENCOUNTER — Other Ambulatory Visit: Payer: Self-pay | Admitting: General Practice

## 2013-07-15 DIAGNOSIS — I4891 Unspecified atrial fibrillation: Secondary | ICD-10-CM

## 2013-07-15 DIAGNOSIS — I2699 Other pulmonary embolism without acute cor pulmonale: Secondary | ICD-10-CM

## 2013-07-15 MED ORDER — SULFAMETHOXAZOLE-TRIMETHOPRIM 800-160 MG PO TABS: 1.0000 | ORAL_TABLET | Freq: Every day | ORAL | Status: DC

## 2013-07-15 MED ORDER — GABAPENTIN 300 MG PO CAPS: 300.0000 mg | ORAL_CAPSULE | Freq: Three times a day (TID) | ORAL | Status: DC

## 2013-07-15 MED ORDER — WARFARIN SODIUM 5 MG PO TABS: ORAL_TABLET | ORAL | Status: DC

## 2013-07-15 NOTE — Telephone Encounter (Signed)
Both rx done erx

## 2013-07-20 ENCOUNTER — Other Ambulatory Visit: Payer: Self-pay

## 2013-07-20 MED ORDER — PRAVASTATIN SODIUM 40 MG PO TABS: 20.0000 mg | ORAL_TABLET | Freq: Every day | ORAL | Status: DC

## 2013-07-20 MED ORDER — PANTOPRAZOLE SODIUM 40 MG PO TBEC: 40.0000 mg | DELAYED_RELEASE_TABLET | Freq: Every day | ORAL | Status: DC

## 2013-07-20 MED ORDER — METFORMIN HCL 500 MG PO TABS: 500.0000 mg | ORAL_TABLET | Freq: Two times a day (BID) | ORAL | Status: DC

## 2013-07-25 ENCOUNTER — Other Ambulatory Visit: Payer: Self-pay | Admitting: *Deleted

## 2013-07-25 MED ORDER — SULFAMETHOXAZOLE-TRIMETHOPRIM 800-160 MG PO TABS: 1.0000 | ORAL_TABLET | Freq: Every day | ORAL | Status: DC

## 2013-07-25 MED ORDER — LABETALOL HCL 200 MG PO TABS: 400.0000 mg | ORAL_TABLET | Freq: Two times a day (BID) | ORAL | Status: DC

## 2013-07-25 MED ORDER — GABAPENTIN 300 MG PO CAPS: 300.0000 mg | ORAL_CAPSULE | Freq: Three times a day (TID) | ORAL | Status: DC

## 2013-08-02 ENCOUNTER — Ambulatory Visit (INDEPENDENT_AMBULATORY_CARE_PROVIDER_SITE_OTHER): Payer: Medicare Other | Admitting: General Practice

## 2013-08-02 DIAGNOSIS — E539 Vitamin B deficiency, unspecified: Secondary | ICD-10-CM

## 2013-08-02 DIAGNOSIS — I4891 Unspecified atrial fibrillation: Secondary | ICD-10-CM

## 2013-08-02 DIAGNOSIS — I2699 Other pulmonary embolism without acute cor pulmonale: Secondary | ICD-10-CM

## 2013-08-02 LAB — POCT INR: INR: 2

## 2013-08-02 MED ORDER — CYANOCOBALAMIN 1000 MCG/ML IJ SOLN
1000.0000 ug | Freq: Once | INTRAMUSCULAR | Status: AC
  Administered 2013-08-02: 1000 ug via INTRAMUSCULAR

## 2013-08-02 NOTE — Progress Notes (Signed)
Pre-visit discussion using our clinic review tool. No additional management support is needed unless otherwise documented below in the visit note.  

## 2013-08-15 ENCOUNTER — Other Ambulatory Visit: Payer: Self-pay | Admitting: Internal Medicine

## 2013-08-30 ENCOUNTER — Ambulatory Visit (INDEPENDENT_AMBULATORY_CARE_PROVIDER_SITE_OTHER): Payer: Medicare Other | Admitting: General Practice

## 2013-08-30 DIAGNOSIS — E539 Vitamin B deficiency, unspecified: Secondary | ICD-10-CM

## 2013-08-30 DIAGNOSIS — I2699 Other pulmonary embolism without acute cor pulmonale: Secondary | ICD-10-CM

## 2013-08-30 DIAGNOSIS — I4891 Unspecified atrial fibrillation: Secondary | ICD-10-CM

## 2013-08-30 MED ORDER — CYANOCOBALAMIN 1000 MCG/ML IJ SOLN
1000.0000 ug | Freq: Once | INTRAMUSCULAR | Status: AC
  Administered 2013-08-30: 1000 ug via INTRAMUSCULAR

## 2013-08-30 NOTE — Progress Notes (Signed)
Pre-visit discussion using our clinic review tool. No additional management support is needed unless otherwise documented below in the visit note.  

## 2013-09-27 ENCOUNTER — Ambulatory Visit (INDEPENDENT_AMBULATORY_CARE_PROVIDER_SITE_OTHER): Payer: Medicare Other | Admitting: General Practice

## 2013-09-27 ENCOUNTER — Ambulatory Visit (INDEPENDENT_AMBULATORY_CARE_PROVIDER_SITE_OTHER): Payer: Medicare Other | Admitting: Internal Medicine

## 2013-09-27 ENCOUNTER — Other Ambulatory Visit (INDEPENDENT_AMBULATORY_CARE_PROVIDER_SITE_OTHER): Payer: Medicare Other

## 2013-09-27 ENCOUNTER — Encounter: Payer: Self-pay | Admitting: Internal Medicine

## 2013-09-27 VITALS — BP 118/62 | HR 76 | Temp 97.4°F | Wt 133.4 lb

## 2013-09-27 DIAGNOSIS — H9190 Unspecified hearing loss, unspecified ear: Secondary | ICD-10-CM

## 2013-09-27 DIAGNOSIS — K6289 Other specified diseases of anus and rectum: Secondary | ICD-10-CM

## 2013-09-27 DIAGNOSIS — Z23 Encounter for immunization: Secondary | ICD-10-CM

## 2013-09-27 DIAGNOSIS — I10 Essential (primary) hypertension: Secondary | ICD-10-CM

## 2013-09-27 DIAGNOSIS — E539 Vitamin B deficiency, unspecified: Secondary | ICD-10-CM

## 2013-09-27 DIAGNOSIS — E119 Type 2 diabetes mellitus without complications: Secondary | ICD-10-CM

## 2013-09-27 DIAGNOSIS — I4891 Unspecified atrial fibrillation: Secondary | ICD-10-CM

## 2013-09-27 DIAGNOSIS — M79609 Pain in unspecified limb: Secondary | ICD-10-CM

## 2013-09-27 DIAGNOSIS — Z5181 Encounter for therapeutic drug level monitoring: Secondary | ICD-10-CM

## 2013-09-27 DIAGNOSIS — H9191 Unspecified hearing loss, right ear: Secondary | ICD-10-CM | POA: Insufficient documentation

## 2013-09-27 DIAGNOSIS — E785 Hyperlipidemia, unspecified: Secondary | ICD-10-CM

## 2013-09-27 DIAGNOSIS — I2699 Other pulmonary embolism without acute cor pulmonale: Secondary | ICD-10-CM

## 2013-09-27 DIAGNOSIS — M79673 Pain in unspecified foot: Secondary | ICD-10-CM

## 2013-09-27 LAB — HEPATIC FUNCTION PANEL
ALK PHOS: 56 U/L (ref 39–117)
ALT: 14 U/L (ref 0–35)
AST: 16 U/L (ref 0–37)
Albumin: 4 g/dL (ref 3.5–5.2)
BILIRUBIN DIRECT: 0 mg/dL (ref 0.0–0.3)
BILIRUBIN TOTAL: 0.6 mg/dL (ref 0.3–1.2)
TOTAL PROTEIN: 6.7 g/dL (ref 6.0–8.3)

## 2013-09-27 LAB — BASIC METABOLIC PANEL
BUN: 38 mg/dL — ABNORMAL HIGH (ref 6–23)
CALCIUM: 9.6 mg/dL (ref 8.4–10.5)
CO2: 23 meq/L (ref 19–32)
Chloride: 108 mEq/L (ref 96–112)
Creatinine, Ser: 1.7 mg/dL — ABNORMAL HIGH (ref 0.4–1.2)
GFR: 29.39 mL/min — ABNORMAL LOW (ref >60.00)
Glucose, Bld: 133 mg/dL — ABNORMAL HIGH (ref 70–99)
Potassium: 5 mEq/L (ref 3.5–5.1)
SODIUM: 138 meq/L (ref 135–145)

## 2013-09-27 LAB — LIPID PANEL
CHOL/HDL RATIO: 3
Cholesterol: 132 mg/dL (ref 0–200)
HDL: 43.5 mg/dL (ref >39.00)
LDL CALC: 71 mg/dL (ref 0–99)
Triglycerides: 87 mg/dL (ref 0.0–149.0)
VLDL: 17.4 mg/dL (ref 0.0–40.0)

## 2013-09-27 LAB — POCT INR: INR: 2.3

## 2013-09-27 LAB — HEMOGLOBIN A1C: Hgb A1c MFr Bld: 5.9 % (ref 4.6–6.5)

## 2013-09-27 MED ORDER — CYANOCOBALAMIN 1000 MCG/ML IJ SOLN
1000.0000 ug | Freq: Once | INTRAMUSCULAR | Status: AC
  Administered 2013-09-27: 1000 ug via INTRAMUSCULAR

## 2013-09-27 NOTE — Assessment & Plan Note (Signed)
Better with irrigation

## 2013-09-27 NOTE — Assessment & Plan Note (Signed)
[place] for refer GI = dr Carlean Purl

## 2013-09-27 NOTE — Progress Notes (Signed)
Subjective:    Patient ID: Lindsay Sheppard, female    DOB: [birth year], 80 y.o.   MRN: [id number]  HPI  Here to f/u; overall doing ok,  Pt denies chest pain, increased sob or doe, wheezing, orthopnea, PND, increased LE swelling, palpitations, dizziness or syncope.  Pt denies polydipsia, polyuria, but has been having low sugars twice or so weekly. Pt denies new neurological symptoms such as new headache, or facial or extremity weakness or numbness.   Pt states overall good compliance with meds, has been trying to follow lower cholesterol, diabetic diet, with wt overall stable,  but little exercise however.  Has some bilat intermittent foot pain, occas numbness, does not see podiatry with hx of DM yet.  Also with 2 mo intermittent rectal pain, no bleeding, has dark stool on iron chronic, no abd pain, but has hx of hemorrhoid. Past Medical History  Diagnosis Date  . POSTHERPETIC NEURALGIA 11/02/2009  . DIABETES MELLITUS, TYPE II 09/06/2007  . B12 DEFICIENCY 05/23/2007  . HYPERLIPIDEMIA 05/23/2007  . HYPERKALEMIA 05/30/2010  . ANEMIA-IRON DEFICIENCY 01/26/2007  . DEPRESSIVE DISORDER 01/25/2008  . BLEPHARITIS, BILATERAL 02/23/2008  . Other specified forms of hearing loss 05/30/2010  . HYPERTENSION 01/26/2007  . Atrial fibrillation 05/23/2007  . DIASTOLIC HEART FAILURE, CHRONIC 12/14/2009  . POSTURAL HYPOTENSION 02/07/2008  . ALLERGIC RHINITIS 11/02/2009  . GERD 01/26/2007  . CONSTIPATION 01/25/2008  . RENAL INSUFFICIENCY 07/02/2009  . RENAL CYST, LEFT 09/06/2007  . SKIN LESION 05/30/2010  . BACK PAIN 08/02/2007  . OSTEOPOROSIS 05/23/2007  . SYNCOPE 02/07/2008  . FATIGUE 09/06/2007  . Dysuria 09/05/2008  . Abdominal pain, epigastric 09/06/2007  . EPIGASTRIC TENDERNESS 10/12/2007  . PULMONARY EMBOLISM, HX OF 05/23/2007  . SHINGLES, HX OF 05/23/2007  . Pulmonary embolism 10/14/2010  . 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 Left foot pain    Past Surgical History  Procedure Laterality Date  . Appendectomy    . Abdominal  hysterectomy      reports that she has never smoked. She does not have any smokeless tobacco history on file. She reports that she does not drink alcohol or use illicit drugs. family history includes Breast cancer in her mother; Colon cancer in her father; Diabetes in her sister; Hypertension in her other; Stroke in her father. Allergies  Allergen Reactions  . Amiodarone     REACTION: f? fluid retention  . Deltasone [Prednisone]   . Diazepam     REACTION: agitation  . Diltiazem Hcl   . Levofloxacin     REACTION: nausea  . Pregabalin     REACTION: hallucinations  . Spironolactone     REACTION: elevated K at lowest dose  . Tequin [Gatifloxacin]    Current Outpatient Prescriptions on File Prior to Visit  Medication Sig Dispense Refill  . ACCU-CHEK COMPACT PLUS test strip TEST TWICE A DAY OR AS DIRECTED  100 each  11  . B-D INS SYRINGE 0.5CC/31GX5/16 31G X 5/16" 0.5 ML MISC use daily AS INSTRUCTED  100 each  11  . cloNIDine (CATAPRES - DOSED IN MG/24 HR) 0.3 mg/24hr Place 1 patch (0.3 mg total) onto the skin every 7 (seven) days.  12 patch  5  . Cyanocobalamin (VITAMIN B-12 IJ) Inject as directed every 30 (thirty) days.      . felodipine (PLENDIL) 5 MG 24 hr tablet Take 5 mg by mouth daily.      . ferrous sulfate 325 (65 FE) MG tablet Take 325 mg by mouth. Take 1  by mouth twice a day       . fexofenadine (ALLEGRA) 180 MG tablet Take 180 mg by mouth daily.        . fluticasone (FLONASE) 50 MCG/ACT nasal spray Place 2 sprays into the nose daily.  16 g  2  . furosemide (LASIX) 20 MG tablet Take every other day  90 tablet  3  . gabapentin (NEURONTIN) 300 MG capsule Take 1 capsule (300 mg total) by mouth 3 (three) times daily.  270 capsule  1  . insulin glargine (LANTUS) 100 UNIT/ML injection Inject 5 units subcutaneously once a day  10 mL  5  . Insulin Syringe-Needle U-100 (B-D INSULIN SYRINGE) 31G X 5/16" 0.3 ML MISC by Does not apply route daily.        Marland Kitchen labetalol (NORMODYNE) 200 MG  tablet Take 2 tablets (400 mg total) by mouth 2 (two) times daily. 2 by mouth two times a day  360 tablet  3  . metFORMIN (GLUCOPHAGE) 500 MG tablet Take 1 tablet (500 mg total) by mouth 2 (two) times daily with a meal. 1 po twice per day  180 tablet  3  . pantoprazole (PROTONIX) 40 MG tablet Take 1 tablet (40 mg total) by mouth daily.  90 tablet  3  . pravastatin (PRAVACHOL) 40 MG tablet Take 0.5 tablets (20 mg total) by mouth daily. 1/2 daily  45 tablet  3  . sulfamethoxazole-trimethoprim (BACTRIM DS,SEPTRA DS) 800-160 MG per tablet Take 1 tablet by mouth daily.  90 tablet  3  . SYRINGE-NEEDLE, DISP, 3 ML 25G X 5/8" 3 ML MISC Use as directed       . warfarin (COUMADIN) 5 MG tablet Take as directed by the coumadin clinic.  90 tablet  0   No current facility-administered medications on file prior to visit.     Review of Systems  Constitutional: Negative for unexpected weight change, or unusual diaphoresis  HENT: Negative for tinnitus.   Eyes: Negative for photophobia and visual disturbance.  Respiratory: Negative for choking and stridor.   Gastrointestinal: Negative for vomiting and blood in stool.  Genitourinary: Negative for hematuria and decreased urine volume.  Musculoskeletal: Negative for acute joint swelling Skin: Negative for color change and wound.  Neurological: Negative for tremors and numbness other than noted  Psychiatric/Behavioral: Negative for decreased concentration or  hyperactivity.       Objective:   Physical Exam BP 118/62  Pulse 76  Temp(Src) 97.4 F (36.3 C) (Oral)  Wt 133 lb 6.4 oz (60.51 kg)  SpO2 99% VS noted,  Constitutional: Pt appears well-developed and well-nourished. - on the thin side HENT: Head: NCAT.  Right Ear: External ear normal.  Left Ear: External ear normal.  Eyes: Conjunctivae and EOM are normal. Pupils are equal, round, and reactive to light.  Neck: Normal range of motion. Neck supple.  Cardiovascular: Normal rate and regular rhythm.     Pulmonary/Chest: Effort normal and breath sounds normal.  Neurological: Pt is alert. Not confused  Skin: Skin is warm. No erythema.  Psychiatric: Pt behavior is normal. Thought content normal.     Assessment & Plan:

## 2013-09-27 NOTE — Assessment & Plan Note (Signed)
With hx of dm and prob neuropathy 0- for poddaitry referral

## 2013-09-27 NOTE — Progress Notes (Signed)
Pre-visit discussion using our clinic review tool. No additional management support is needed unless otherwise documented below in the visit note.  

## 2013-09-27 NOTE — Assessment & Plan Note (Signed)
stable overall by history and exam, recent data reviewed with pt, and pt to continue medical treatment as before,  to f/u any worsening symptoms or concerns Lab Results  Component Value Date   LDLCALC 57 03/22/2013

## 2013-09-27 NOTE — Patient Instructions (Addendum)
You had the new Prevnar pneumonia shot today Your right ear was irrigated of wax today OK to stop the glimeparide  Please continue all other medications as before, and refills have been done if requested. Please have the pharmacy call with any other refills you may need.  You will be contacted regarding the referral for: podiatry, and Gastroentorology  Please go to the LAB in the Basement (turn left off the elevator) for the tests to be done today You will be contacted by phone if any changes need to be made immediately.  Otherwise, you will receive a letter about your results with an explanation, but please check with MyChart first.  Please remember to sign up for My Chart if you have not done so, as this will be important to you in the future with finding out test results, communicating by private email, and scheduling acute appointments online when needed.  Please return in 6 months, or sooner if needed

## 2013-09-27 NOTE — Assessment & Plan Note (Signed)
stable overall by history and exam, recent data reviewed with pt, and pt to continue medical treatment as before,  to f/u any worsening symptoms or concerns BP Readings from Last 3 Encounters:  09/27/13 118/62  05/12/13 140/70  04/12/13 132/69

## 2013-09-27 NOTE — Assessment & Plan Note (Addendum)
overcontrolled - to d/c glimeparide, cont other meds, for labs today, cont diet, wt control  Lab Results  Component Value Date   HGBA1C 5.9 03/22/2013

## 2013-09-27 NOTE — Addendum Note (Signed)
Addended by: Sharon Seller B on: 09/27/2013 10:47 AM   Modules accepted: Orders

## 2013-09-28 ENCOUNTER — Encounter: Payer: Self-pay | Admitting: Gastroenterology

## 2013-10-04 ENCOUNTER — Encounter: Payer: Self-pay | Admitting: Internal Medicine

## 2013-10-04 ENCOUNTER — Ambulatory Visit (INDEPENDENT_AMBULATORY_CARE_PROVIDER_SITE_OTHER): Payer: Medicare Other | Admitting: Internal Medicine

## 2013-10-04 ENCOUNTER — Other Ambulatory Visit (INDEPENDENT_AMBULATORY_CARE_PROVIDER_SITE_OTHER): Payer: Medicare Other

## 2013-10-04 ENCOUNTER — Ambulatory Visit (INDEPENDENT_AMBULATORY_CARE_PROVIDER_SITE_OTHER)
Admission: RE | Admit: 2013-10-04 | Discharge: 2013-10-04 | Disposition: A | Discharge status: Home / Self Care | Payer: Medicare Other | Source: Ambulatory Visit | Attending: Internal Medicine | Admitting: Internal Medicine

## 2013-10-04 VITALS — BP 142/68 | HR 78 | Temp 97.7°F | Wt 133.0 lb

## 2013-10-04 DIAGNOSIS — R001 Bradycardia, unspecified: Secondary | ICD-10-CM

## 2013-10-04 DIAGNOSIS — I498 Other specified cardiac arrhythmias: Secondary | ICD-10-CM

## 2013-10-04 DIAGNOSIS — I5032 Chronic diastolic (congestive) heart failure: Secondary | ICD-10-CM

## 2013-10-04 DIAGNOSIS — I4891 Unspecified atrial fibrillation: Secondary | ICD-10-CM

## 2013-10-04 LAB — CBC WITH DIFFERENTIAL/PLATELET
Basophils Absolute: 0 10*3/uL (ref 0.0–0.1)
Basophils Relative: 0.3 % (ref 0.0–3.0)
EOS PCT: 2 % (ref 0.0–5.0)
Eosinophils Absolute: 0.1 10*3/uL (ref 0.0–0.7)
HEMATOCRIT: 37.2 % (ref 36.0–46.0)
HEMOGLOBIN: 11.9 g/dL — AB (ref 12.0–15.0)
LYMPHS ABS: 0.7 10*3/uL (ref 0.7–4.0)
Lymphocytes Relative: 14.9 % (ref 12.0–46.0)
MCHC: 32 g/dL (ref 30.0–36.0)
MCV: 87.8 fl (ref 78.0–100.0)
MONO ABS: 0.4 10*3/uL (ref 0.1–1.0)
MONOS PCT: 8.1 % (ref 3.0–12.0)
NEUTROS ABS: 3.3 10*3/uL (ref 1.4–7.7)
Neutrophils Relative %: 74.7 % (ref 43.0–77.0)
Platelets: 183 10*3/uL (ref 150.0–400.0)
RBC: 4.23 Mil/uL (ref 3.87–5.11)
RDW: 14.6 % (ref 11.5–14.6)
WBC: 4.4 10*3/uL — ABNORMAL LOW (ref 4.5–10.5)

## 2013-10-04 LAB — TSH: TSH: 5.17 u[IU]/mL (ref 0.35–5.50)

## 2013-10-04 NOTE — Progress Notes (Signed)
Subjective:    Patient ID: Lindsay Sheppard, female    DOB: [birth year], 80 y.o.   MRN: [id number]  HPI  Concerned about decreased heart rate and swollen ankles Onset of problems in past week since OV with PCP last week for routine 6 mo check associated with resting dyspnea and increasing fatigue Edema improved in past 72h with use of furosemide daily (usual is qod) - no lasix yet today as planning to resume qod No other med changes   Past Medical History  Diagnosis Date  . POSTHERPETIC NEURALGIA 11/02/2009  . DIABETES MELLITUS, TYPE II 09/06/2007  . B12 DEFICIENCY 05/23/2007  . HYPERLIPIDEMIA 05/23/2007  . HYPERKALEMIA 05/30/2010  . ANEMIA-IRON DEFICIENCY 01/26/2007  . DEPRESSIVE DISORDER 01/25/2008  . BLEPHARITIS, BILATERAL 02/23/2008  . Other specified forms of hearing loss 05/30/2010  . HYPERTENSION 01/26/2007  . Atrial fibrillation 05/23/2007  . DIASTOLIC HEART FAILURE, CHRONIC 12/14/2009  . POSTURAL HYPOTENSION 02/07/2008  . ALLERGIC RHINITIS 11/02/2009  . GERD 01/26/2007  . CONSTIPATION 01/25/2008  . RENAL INSUFFICIENCY 07/02/2009  . RENAL CYST, LEFT 09/06/2007  . SKIN LESION 05/30/2010  . BACK PAIN 08/02/2007  . OSTEOPOROSIS 05/23/2007  . SYNCOPE 02/07/2008  . FATIGUE 09/06/2007  . Dysuria 09/05/2008  . Abdominal pain, epigastric 09/06/2007  . EPIGASTRIC TENDERNESS 10/12/2007  . PULMONARY EMBOLISM, HX OF 05/23/2007  . SHINGLES, HX OF 05/23/2007  . Pulmonary embolism 10/14/2010  . 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 Left foot pain     Review of Systems  Constitutional: Positive for fatigue. Negative for fever and unexpected weight change.  Respiratory: Positive for shortness of breath. Negative for cough and wheezing.   Cardiovascular: Positive for leg swelling. Negative for chest pain and palpitations.       Objective:   Physical Exam BP 142/68  Pulse 78  Temp(Src) 97.7 F (36.5 C) (Oral)  Wt 133 lb (60.328 kg)  SpO2 97% Wt Readings from Last 3 Encounters:  10/04/13 133 lb (60.328  kg)  09/27/13 133 lb 6.4 oz (60.51 kg)  05/12/13 135 lb (61.236 kg)   Constitutional: She is lederly and sitting in WC, but appears well-developed and well-nourished. No distress. Dtr at side Neck: Normal range of motion. Neck supple. No JVD present. No thyromegaly present.  Cardiovascular: Normal rate, irregular rhythm and normal heart sounds.  No murmur heard. No BLE edema. Pulmonary/Chest: Effort normal. Crackles at L base., but no respiratory distress. She has no wheezes.   Psychiatric: She has a normal mood and affect. Her behavior is normal. Judgment and thought content normal.   Lab Results  Component Value Date   WBC 4.9 03/22/2013   HGB 11.8* 03/22/2013   HCT 34.9* 03/22/2013   PLT 170.0 03/22/2013   GLUCOSE 133* 09/27/2013   CHOL 132 09/27/2013   TRIG 87.0 09/27/2013   HDL 43.50 09/27/2013   LDLCALC 71 09/27/2013   ALT 14 09/27/2013   AST 16 09/27/2013   NA 138 09/27/2013   K 5.0 09/27/2013   CL 108 09/27/2013   CREATININE 1.7* 09/27/2013   BUN 38* 09/27/2013   CO2 23 09/27/2013   TSH 3.05 03/10/2012   INR 2.3 09/27/2013   HGBA1C 5.9 09/27/2013   MICROALBUR 2.5* 06/03/2011   CXR 09/2011 - L base scarring 10/2009: 2d echo - LVEF 55-60%, severe LVH changes     Assessment & Plan:   A. Fib with episodes of bradycardia.  Dyspnea Edema in past 7 days, improved with increase dose lasix x 72h  Home  log reviewed with heart rate ranging 45-78 ECG today: sinus @ 73bpm - freq PVCs - LAFB  Check CBC and TSH Check 2v chest x-ray to evaluate potential edema given left base crackles ( but suspect related to scarring given chest x-ray January 2013 changes) Refer back to cardiology for review and check 2-D echo now  No medication changes recommended at this time (pending further results) as she appears euvolemic and asymptomatic at this time (HR>60)

## 2013-10-04 NOTE — Progress Notes (Signed)
Pre-visit discussion using our clinic review tool. No additional management support is needed unless otherwise documented below in the visit note.  

## 2013-10-04 NOTE — Assessment & Plan Note (Signed)
On anticoag for same - Consider lowering beta-blocker dose - no other rate limiting meds noted follow up cards on same given mild bradycardia

## 2013-10-04 NOTE — Patient Instructions (Signed)
It was good to see you today.  We have reviewed your prior records including labs and tests today  Medications reviewed and updated, no changes recommended at this time.  Test(s) ordered today. Your results will be released to [place] (or called to you) after review, usually within 72hours after test completion. If any changes need to be made, you will be notified at that same time.  Will refer for 2-D echocardiogram to evaluate heart function and for appointment with Dr. Caryl Comes in cardiology to review symptoms and heart rate issue -my office will call regarding these appointments once arranged  Followup with Dr. Jenny Reichmann in 2 weeks to continue review, please call sooner if problems

## 2013-10-04 NOTE — Assessment & Plan Note (Signed)
euvolemic today but family reports recent increase in edema  Improved with transient increase lasix, now back to qod Check labs and echo Refer back to cards Check CXR r/o edema

## 2013-10-19 ENCOUNTER — Ambulatory Visit: Payer: Medicare Other | Admitting: Internal Medicine

## 2013-11-01 ENCOUNTER — Telehealth: Payer: Self-pay | Admitting: Gastroenterology

## 2013-11-01 ENCOUNTER — Ambulatory Visit (INDEPENDENT_AMBULATORY_CARE_PROVIDER_SITE_OTHER): Payer: Medicare Other | Admitting: Gastroenterology

## 2013-11-01 ENCOUNTER — Encounter: Payer: Self-pay | Admitting: Gastroenterology

## 2013-11-01 VITALS — BP 138/70 | HR 73 | Ht 66.0 in | Wt 129.2 lb

## 2013-11-01 DIAGNOSIS — K6289 Other specified diseases of anus and rectum: Secondary | ICD-10-CM

## 2013-11-01 MED ORDER — HYDROCORTISONE ACETATE 25 MG RE SUPP: 25.0000 mg | Freq: Two times a day (BID) | RECTAL | Status: DC

## 2013-11-01 NOTE — Assessment & Plan Note (Signed)
Patient suffers from chronic constipation and straining.  There are no obvious abnormalities by rectal exam and she is Hemoccult negative.  Suspect pain is related to hemorrhoidal disease.  Recommendations #1 Anusol HC suppositories #2 MiraLax every 2-3 days if no bowel movements

## 2013-11-01 NOTE — Progress Notes (Signed)
_                                                                                                                History of Present Illness:  Pleasant 80 year old white female with history of iron deficiency anemia, atrial fibrillation, on Coumadin, referred for evaluation of rectal discomfort.  She has had constipation for years.  She takes a stool softener but still strains at stool.  She's been complaining of rectal discomfort for several months.  This may occur with or without a bowel movement.  There is no history of change in bowel habits or rectal bleeding.  She takes iron and stools remain dark.  Last colonoscopy was 2002.    Past Medical History  Diagnosis Date  . POSTHERPETIC NEURALGIA 11/02/2009  . DIABETES MELLITUS, TYPE II 09/06/2007  . B12 DEFICIENCY 05/23/2007  . HYPERLIPIDEMIA 05/23/2007  . HYPERKALEMIA 05/30/2010  . ANEMIA-IRON DEFICIENCY 01/26/2007  . DEPRESSIVE DISORDER 01/25/2008  . BLEPHARITIS, BILATERAL 02/23/2008  . Other specified forms of hearing loss 05/30/2010  . HYPERTENSION 01/26/2007  . Atrial fibrillation 05/23/2007  . DIASTOLIC HEART FAILURE, CHRONIC 12/14/2009  . POSTURAL HYPOTENSION 02/07/2008  . ALLERGIC RHINITIS 11/02/2009  . GERD 01/26/2007  . CONSTIPATION 01/25/2008  . RENAL INSUFFICIENCY 07/02/2009  . RENAL CYST, LEFT 09/06/2007  . SKIN LESION 05/30/2010  . BACK PAIN 08/02/2007  . OSTEOPOROSIS 05/23/2007  . SYNCOPE 02/07/2008  . FATIGUE 09/06/2007  . Dysuria 09/05/2008  . Abdominal pain, epigastric 09/06/2007  . EPIGASTRIC TENDERNESS 10/12/2007  . PULMONARY EMBOLISM, HX OF 05/23/2007  . SHINGLES, HX OF 05/23/2007  . Pulmonary embolism 10/14/2010  . 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 Left foot pain   . Pneumonia   . Status post dilation of esophageal narrowing    Past Surgical History  Procedure Laterality Date  . Appendectomy  1943  . Abdominal hysterectomy  1964  . Cardiac electrophysiology mapping and ablation     family history includes Breast  cancer in her mother; Colon cancer in her father; Diabetes in her sister; Hypertension in her other; Stroke in her father. Current Outpatient Prescriptions  Medication Sig Dispense Refill  . ACCU-CHEK COMPACT PLUS test strip TEST TWICE A DAY OR AS DIRECTED  100 each  11  . B-D INS SYRINGE 0.5CC/31GX5/16 31G X 5/16" 0.5 ML MISC use daily AS INSTRUCTED  100 each  11  . cloNIDine (CATAPRES - DOSED IN MG/24 HR) 0.3 mg/24hr Place 1 patch (0.3 mg total) onto the skin every 7 (seven) days.  12 patch  5  . Cyanocobalamin (VITAMIN B-12 IJ) Inject as directed every 30 (thirty) days.      . felodipine (PLENDIL) 5 MG 24 hr tablet Take 5 mg by mouth daily.      . ferrous sulfate 325 (65 FE) MG tablet Take 325 mg by mouth. Take 1 by mouth twice a day       . fexofenadine (ALLEGRA) 180 MG tablet Take 180 mg by mouth daily.        Marland Kitchen  fluticasone (FLONASE) 50 MCG/ACT nasal spray Place 2 sprays into the nose daily.  16 g  2  . furosemide (LASIX) 20 MG tablet Take every other day  90 tablet  3  . gabapentin (NEURONTIN) 300 MG capsule Take 1 capsule (300 mg total) by mouth 3 (three) times daily.  270 capsule  1  . glimepiride (AMARYL) 1 MG tablet Take 1 mg by mouth daily.       . insulin glargine (LANTUS) 100 UNIT/ML injection Inject 5 units subcutaneously once a day  10 mL  5  . Insulin Syringe-Needle U-100 (B-D INSULIN SYRINGE) 31G X 5/16" 0.3 ML MISC by Does not apply route daily.        Marland Kitchen labetalol (NORMODYNE) 200 MG tablet Take 2 tablets (400 mg total) by mouth 2 (two) times daily. 2 by mouth two times a day  360 tablet  3  . metFORMIN (GLUCOPHAGE) 500 MG tablet Take 1 tablet (500 mg total) by mouth 2 (two) times daily with a meal. 1 po twice per day  180 tablet  3  . pantoprazole (PROTONIX) 40 MG tablet Take 1 tablet (40 mg total) by mouth daily.  90 tablet  3  . pravastatin (PRAVACHOL) 40 MG tablet Take 0.5 tablets (20 mg total) by mouth daily. 1/2 daily  45 tablet  3  . sulfamethoxazole-trimethoprim (BACTRIM  DS,SEPTRA DS) 800-160 MG per tablet Take 1 tablet by mouth daily.  90 tablet  3  . SYRINGE-NEEDLE, DISP, 3 ML 25G X 5/8" 3 ML MISC Use as directed       . warfarin (COUMADIN) 5 MG tablet Take as directed by the coumadin clinic.  90 tablet  0   No current facility-administered medications for this visit.   Allergies as of 11/01/2013 - Review Complete 11/01/2013  Allergen Reaction Noted  . Amiodarone  08/02/2007  . Deltasone [prednisone]  05/12/2013  . Diazepam  05/23/2007  . Diltiazem hcl  05/23/2007  . Levofloxacin  05/23/2007  . Pregabalin  08/02/2007  . Spironolactone  04/24/2008  . Tequin [gatifloxacin]  05/12/2013    reports that she has never smoked. She has never used smokeless tobacco. She reports that she does not drink alcohol or use illicit drugs.     Review of Systems: She has lower extremity weakness and is unstable standing.  Pertinent positive and negative review of systems were noted in the above HPI section. All other review of systems were otherwise negative.  Vital signs were reviewed in today's medical record Physical Exam: General: Elderly female in no acute distress Skin: anicteric Head: Normocephalic and atraumatic Eyes:  sclerae anicteric, EOMI Ears: Normal auditory acuity Mouth: No deformity or lesions Neck: Supple, no masses or thyromegaly Lungs: Clear throughout to auscultation Heart: Regular rate and rhythm; no murmurs, rubs or bruits Abdomen: Soft, non tender and non distended. No masses, hepatosplenomegaly or hernias noted. Normal Bowel sounds Rectal: There is an external hemorrhoid.  There are no masses.  Stool is Hemoccult negative. Musculoskeletal: Symmetrical with no gross deformities  Skin: No lesions on visible extremities Pulses:  Normal pulses noted Extremities: No clubbing, cyanosis, edema or deformities noted Neurological: Alert oriented x 4, grossly nonfocal Cervical Nodes:  No significant cervical adenopathy Inguinal Nodes: No  significant inguinal adenopathy Psychological:  Alert and cooperative. Normal mood and affect  See Assessment and Plan under Problem List

## 2013-11-01 NOTE — Patient Instructions (Signed)
We will send your prescription to your pharmacy Use Miralax every 2-3 days if no bowel movements

## 2013-11-03 NOTE — Telephone Encounter (Signed)
Contacted pharmacy to send over rejection letter   Waiting on fax

## 2013-11-03 NOTE — Telephone Encounter (Signed)
Dr Deatra Ina, This patients [facility] Medicare will not cover any type of suppository no prior authorization option  Is there something else we can send her

## 2013-11-04 NOTE — Telephone Encounter (Signed)
She can take an over-the-counter hemorrhoidal suppository instead

## 2013-11-08 ENCOUNTER — Encounter: Payer: Self-pay | Admitting: Internal Medicine

## 2013-11-08 ENCOUNTER — Ambulatory Visit (INDEPENDENT_AMBULATORY_CARE_PROVIDER_SITE_OTHER): Payer: Medicare Other | Admitting: General Practice

## 2013-11-08 ENCOUNTER — Ambulatory Visit (INDEPENDENT_AMBULATORY_CARE_PROVIDER_SITE_OTHER): Payer: Medicare Other | Admitting: Internal Medicine

## 2013-11-08 VITALS — BP 162/80 | HR 79 | Ht 66.0 in | Wt 130.0 lb

## 2013-11-08 DIAGNOSIS — R001 Bradycardia, unspecified: Secondary | ICD-10-CM

## 2013-11-08 DIAGNOSIS — Z5181 Encounter for therapeutic drug level monitoring: Secondary | ICD-10-CM

## 2013-11-08 DIAGNOSIS — I4891 Unspecified atrial fibrillation: Secondary | ICD-10-CM

## 2013-11-08 DIAGNOSIS — E539 Vitamin B deficiency, unspecified: Secondary | ICD-10-CM

## 2013-11-08 DIAGNOSIS — I498 Other specified cardiac arrhythmias: Secondary | ICD-10-CM

## 2013-11-08 DIAGNOSIS — I2699 Other pulmonary embolism without acute cor pulmonale: Secondary | ICD-10-CM

## 2013-11-08 LAB — POCT INR: INR: 1.9

## 2013-11-08 MED ORDER — CYANOCOBALAMIN 1000 MCG/ML IJ SOLN
1000.0000 ug | Freq: Once | INTRAMUSCULAR | Status: AC
  Administered 2013-11-08: 1000 ug via INTRAMUSCULAR

## 2013-11-08 NOTE — Progress Notes (Signed)
Patient Care Team: Biagio Borg, MD as PCP - General   HPI  Lindsay Sheppard is a 80 y.o. female Seen afterr hiatus of more than 3 years.  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 She has a history of atrial fibrillation for which she took Coumadin. Thromboembolic risk factors included age, diabetes, hypertension, coronary artery disease for a CHADS-VASc score of 6   Echocardiogram 3/11 demonstrated normal LV function with severe left ventricular hypertrophy  She saw her PCP if February brought with her a long of heart rates ranging 45--78 in ECG at that time apparently demonstrated sinus rhythm with frequent ventricular ectopy  There has been generalized loss of energy over recent months. This has been quite gradual. The family notes no association between her energy level and the variable range of her heart rates.  She has poorly controlled hypertension and has had problems with orthostatic lightheadedness where her son has recorded blood pressures in the 110 range vastly lower than her normal 144-818 systolic range.  She is largely nonambulatory.  Her most recent hypertension addition is felodipine. This is coincidental with a gradually deteriorating functional status and malaise.  Past Medical History  Diagnosis Date  . POSTHERPETIC NEURALGIA 11/02/2009  . DIABETES MELLITUS, TYPE II 09/06/2007  . B12 DEFICIENCY 05/23/2007  . HYPERLIPIDEMIA 05/23/2007  . HYPERKALEMIA 05/30/2010  . ANEMIA-IRON DEFICIENCY 01/26/2007  . DEPRESSIVE DISORDER 01/25/2008  . BLEPHARITIS, BILATERAL 02/23/2008  . Other specified forms of hearing loss 05/30/2010  . HYPERTENSION 01/26/2007  . Atrial fibrillation 05/23/2007  . DIASTOLIC HEART FAILURE, CHRONIC 12/14/2009  . POSTURAL HYPOTENSION 02/07/2008  . ALLERGIC RHINITIS 11/02/2009  . GERD 01/26/2007  . CONSTIPATION 01/25/2008  . RENAL INSUFFICIENCY 07/02/2009  . RENAL CYST, LEFT 09/06/2007  . SKIN LESION 05/30/2010  . BACK PAIN  08/02/2007  . OSTEOPOROSIS 05/23/2007  . SYNCOPE 02/07/2008  . FATIGUE 09/06/2007  . Dysuria 09/05/2008  . Abdominal pain, epigastric 09/06/2007  . EPIGASTRIC TENDERNESS 10/12/2007  . PULMONARY EMBOLISM, HX OF 05/23/2007  . SHINGLES, HX OF 05/23/2007  . Pulmonary embolism 10/14/2010  . 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 Left foot pain   . Pneumonia   . Status post dilation of esophageal narrowing     Past Surgical History  Procedure Laterality Date  . Appendectomy  1943  . Abdominal hysterectomy  1964  . Cardiac electrophysiology mapping and ablation      Current Outpatient Prescriptions  Medication Sig Dispense Refill  . cloNIDine (CATAPRES - DOSED IN MG/24 HR) 0.3 mg/24hr Place 1 patch (0.3 mg total) onto the skin every 7 (seven) days.  12 patch  5  . Cyanocobalamin (VITAMIN B-12 IJ) Inject as directed every 30 (thirty) days.      . felodipine (PLENDIL) 5 MG 24 hr tablet Take 5 mg by mouth daily.      . ferrous sulfate 325 (65 FE) MG tablet Take 325 mg by mouth. Take 1 by mouth twice a day       . fexofenadine (ALLEGRA) 180 MG tablet Take 180 mg by mouth daily.        . fluticasone (FLONASE) 50 MCG/ACT nasal spray Place 2 sprays into the nose daily.  16 g  2  . furosemide (LASIX) 20 MG tablet Take every other day  90 tablet  3  . gabapentin (NEURONTIN) 300 MG capsule Take 300 mg by mouth 2 (two) times daily.      Marland Kitchen glimepiride (  AMARYL) 1 MG tablet Take 1 mg by mouth daily.       . insulin glargine (LANTUS) 100 UNIT/ML injection Inject 7 units subcutaneously once a day      . labetalol (NORMODYNE) 200 MG tablet Take 2 tablets (400 mg total) by mouth 2 (two) times daily. 2 by mouth two times a day  360 tablet  3  . metFORMIN (GLUCOPHAGE) 500 MG tablet Take 1 tablet (500 mg total) by mouth 2 (two) times daily with a meal. 1 po twice per day  180 tablet  3  . pantoprazole (PROTONIX) 40 MG tablet Take 1 tablet (40 mg total) by mouth daily.  90 tablet  3  . pravastatin (PRAVACHOL) 40 MG tablet  Take 0.5 tablets (20 mg total) by mouth daily. 1/2 daily  45 tablet  3  . sulfamethoxazole-trimethoprim (BACTRIM DS,SEPTRA DS) 800-160 MG per tablet Take 1 tablet by mouth daily.  90 tablet  3  . warfarin (COUMADIN) 5 MG tablet Take as directed by the coumadin clinic.  90 tablet  0  . hydrocortisone (ANUSOL-HC) 25 MG suppository Place 1 suppository (25 mg total) rectally every 12 (twelve) hours.  12 suppository  1   No current facility-administered medications for this visit.    Allergies  Allergen Reactions  . Amiodarone     REACTION: f? fluid retention  . Deltasone [Prednisone]   . Diazepam     REACTION: agitation  . Diltiazem Hcl   . Levofloxacin     REACTION: nausea  . Pregabalin     REACTION: hallucinations  . Spironolactone     REACTION: elevated K at lowest dose  . Tequin [Gatifloxacin]     Review of Systems negative except from HPI and PMH  Physical Exam BP 162/80  Pulse 79  Ht 5\' 6"  (1.676 m)  Wt 130 lb (58.968 kg)  BMI 20.99 kg/m2 Well developed and well nourished in no acute distress HENT normal E scleral and icterus clear Neck Supple JVP flat; carotids brisk and full Clear to ausculation  Regular rate and rhythm, 2/6 m Soft with active bowel sounds No clubbing cyanosis none Edema Alert and oriented, grossly normal motor and sensory function sitting in a wheel chair with daughter present Skin Warm and Dry    Assessment and  Plan  Hypertension-severe  Orthostatic hypotension  PVC  I reviewed with the family the cause for her artifactual bradycardia associated with the PVCs. It is noteworthy that there are no clearly attributable symptoms to the PVCs and its associated "bradycardia." I do not identify a specific cardiac cause for her malaise and fatigue. Given its constancy, I wonder whether it is a drug effect. Most recently added drug is felodipine. I suggested she stop that. She will be seeing Dr. Mariann Laster in just a few weeks and perhaps it will be  cleared by then whether felodipine is a culprit or not any specific antihypertensive therapies can be further considered.  Mestinon may be of some benefit given her orthostasis and significant supine an team  Will be glad to see her again as needed

## 2013-11-08 NOTE — Patient Instructions (Addendum)
Your physician has recommended you make the following change in your medication:  1) Stop Felodipine   Follow up as needed

## 2013-11-08 NOTE — Progress Notes (Signed)
Pre visit review using our clinic review tool, if applicable. No additional management support is needed unless otherwise documented below in the visit note. 

## 2013-11-09 NOTE — Telephone Encounter (Signed)
No meds covered pt to use otc medication .Marland Kitchen  Spoke with patients daughter  Patient has been a little bit constipated they have started her on Miralax again every two days  Will keep scheduled appointment for next month

## 2013-11-30 ENCOUNTER — Ambulatory Visit (INDEPENDENT_AMBULATORY_CARE_PROVIDER_SITE_OTHER): Payer: Medicare Other | Admitting: Gastroenterology

## 2013-11-30 ENCOUNTER — Encounter: Payer: Self-pay | Admitting: Gastroenterology

## 2013-11-30 VITALS — BP 152/64 | HR 64 | Ht 66.0 in | Wt 130.0 lb

## 2013-11-30 DIAGNOSIS — K5900 Constipation, unspecified: Secondary | ICD-10-CM

## 2013-11-30 DIAGNOSIS — K6289 Other specified diseases of anus and rectum: Secondary | ICD-10-CM

## 2013-11-30 NOTE — Patient Instructions (Signed)
Follow up as needed

## 2013-11-30 NOTE — Assessment & Plan Note (Signed)
  Probably hemorrhoidal and not resolved

## 2013-11-30 NOTE — Assessment & Plan Note (Signed)
Well controlled with MiraLax

## 2013-11-30 NOTE — Progress Notes (Signed)
          History of Present Illness:  The patient is returned for followup of constipation and rectal discomfort.  With MiraLax supplementation every one to 3 days she is moving her bowels fairly regularly.  She no longer has rectal discomfort.  Altogether she feels that she is doing well.    Review of Systems: Pertinent positive and negative review of systems were noted in the above HPI section. All other review of systems were otherwise negative.    Current Medications, Allergies, Past Medical History, Past Surgical History, Family History and Social History were reviewed in [place] record  Vital signs were reviewed in today's medical record. Physical Exam: General: Elderly female in no acute distress   See Assessment and Plan under Problem List

## 2013-12-06 ENCOUNTER — Ambulatory Visit (INDEPENDENT_AMBULATORY_CARE_PROVIDER_SITE_OTHER): Payer: Medicare Other | Admitting: General Practice

## 2013-12-06 DIAGNOSIS — I2699 Other pulmonary embolism without acute cor pulmonale: Secondary | ICD-10-CM

## 2013-12-06 DIAGNOSIS — Z5181 Encounter for therapeutic drug level monitoring: Secondary | ICD-10-CM

## 2013-12-06 DIAGNOSIS — E539 Vitamin B deficiency, unspecified: Secondary | ICD-10-CM

## 2013-12-06 DIAGNOSIS — I4891 Unspecified atrial fibrillation: Secondary | ICD-10-CM

## 2013-12-06 LAB — POCT INR: INR: 3.2

## 2013-12-06 MED ORDER — CYANOCOBALAMIN 1000 MCG/ML IJ SOLN
1000.0000 ug | Freq: Once | INTRAMUSCULAR | Status: AC
  Administered 2013-12-06: 1000 ug via INTRAMUSCULAR

## 2013-12-06 NOTE — Progress Notes (Signed)
Pre visit review using our clinic review tool, if applicable. No additional management support is needed unless otherwise documented below in the visit note. 

## 2013-12-15 ENCOUNTER — Other Ambulatory Visit: Payer: Self-pay

## 2013-12-15 MED ORDER — CLONIDINE HCL 0.3 MG/24HR TD PTWK: 0.3000 mg | MEDICATED_PATCH | TRANSDERMAL | Status: DC

## 2013-12-22 ENCOUNTER — Other Ambulatory Visit: Payer: Self-pay | Admitting: *Deleted

## 2013-12-22 DIAGNOSIS — I2699 Other pulmonary embolism without acute cor pulmonale: Secondary | ICD-10-CM

## 2013-12-22 DIAGNOSIS — I4891 Unspecified atrial fibrillation: Secondary | ICD-10-CM

## 2013-12-22 MED ORDER — WARFARIN SODIUM 5 MG PO TABS: ORAL_TABLET | ORAL | Status: DC

## 2014-01-03 ENCOUNTER — Ambulatory Visit (INDEPENDENT_AMBULATORY_CARE_PROVIDER_SITE_OTHER): Payer: Medicare Other | Admitting: General Practice

## 2014-01-03 DIAGNOSIS — I4891 Unspecified atrial fibrillation: Secondary | ICD-10-CM

## 2014-01-03 DIAGNOSIS — Z5181 Encounter for therapeutic drug level monitoring: Secondary | ICD-10-CM

## 2014-01-03 DIAGNOSIS — I2699 Other pulmonary embolism without acute cor pulmonale: Secondary | ICD-10-CM

## 2014-01-03 DIAGNOSIS — E539 Vitamin B deficiency, unspecified: Secondary | ICD-10-CM

## 2014-01-03 LAB — POCT INR: INR: 4.4

## 2014-01-03 MED ORDER — CYANOCOBALAMIN 1000 MCG/ML IJ SOLN
1000.0000 ug | Freq: Once | INTRAMUSCULAR | Status: AC
Start: 2014-01-03 — End: 2014-01-03
  Administered 2014-01-03: 1000 ug via INTRAMUSCULAR

## 2014-01-03 NOTE — Progress Notes (Signed)
Pre visit review using our clinic review tool, if applicable. No additional management support is needed unless otherwise documented below in the visit note. 

## 2014-01-17 ENCOUNTER — Ambulatory Visit (INDEPENDENT_AMBULATORY_CARE_PROVIDER_SITE_OTHER): Payer: Medicare Other | Admitting: General Practice

## 2014-01-17 DIAGNOSIS — I4891 Unspecified atrial fibrillation: Secondary | ICD-10-CM

## 2014-01-17 DIAGNOSIS — I2699 Other pulmonary embolism without acute cor pulmonale: Secondary | ICD-10-CM

## 2014-01-17 DIAGNOSIS — Z5181 Encounter for therapeutic drug level monitoring: Secondary | ICD-10-CM

## 2014-01-17 LAB — POCT INR: INR: 3.2

## 2014-01-17 NOTE — Progress Notes (Signed)
Pre visit review using our clinic review tool, if applicable. No additional management support is needed unless otherwise documented below in the visit note. 

## 2014-02-07 ENCOUNTER — Encounter (HOSPITAL_COMMUNITY): Payer: Self-pay | Admitting: Emergency Medicine

## 2014-02-07 ENCOUNTER — Inpatient Hospital Stay (HOSPITAL_COMMUNITY)
Admission: EM | Admit: 2014-02-07 | Discharge: 2014-02-09 | DRG: 543 | Disposition: A | Discharge status: Rehabilitation Facility | Payer: Medicare Other | Attending: Internal Medicine | Admitting: Internal Medicine

## 2014-02-07 ENCOUNTER — Emergency Department (HOSPITAL_COMMUNITY): Payer: Medicare Other

## 2014-02-07 DIAGNOSIS — E875 Hyperkalemia: Secondary | ICD-10-CM | POA: Diagnosis present

## 2014-02-07 DIAGNOSIS — Y92009 Unspecified place in unspecified non-institutional (private) residence as the place of occurrence of the external cause: Secondary | ICD-10-CM

## 2014-02-07 DIAGNOSIS — N390 Urinary tract infection, site not specified: Secondary | ICD-10-CM | POA: Diagnosis present

## 2014-02-07 DIAGNOSIS — T148XXA Other injury of unspecified body region, initial encounter: Secondary | ICD-10-CM

## 2014-02-07 DIAGNOSIS — M79673 Pain in unspecified foot: Secondary | ICD-10-CM

## 2014-02-07 DIAGNOSIS — B0229 Other postherpetic nervous system involvement: Secondary | ICD-10-CM

## 2014-02-07 DIAGNOSIS — D509 Iron deficiency anemia, unspecified: Secondary | ICD-10-CM

## 2014-02-07 DIAGNOSIS — W19XXXA Unspecified fall, initial encounter: Secondary | ICD-10-CM | POA: Diagnosis present

## 2014-02-07 DIAGNOSIS — M810 Age-related osteoporosis without current pathological fracture: Secondary | ICD-10-CM | POA: Diagnosis present

## 2014-02-07 DIAGNOSIS — H9191 Unspecified hearing loss, right ear: Secondary | ICD-10-CM

## 2014-02-07 DIAGNOSIS — Z87898 Personal history of other specified conditions: Secondary | ICD-10-CM

## 2014-02-07 DIAGNOSIS — K5900 Constipation, unspecified: Secondary | ICD-10-CM | POA: Diagnosis present

## 2014-02-07 DIAGNOSIS — I2699 Other pulmonary embolism without acute cor pulmonale: Secondary | ICD-10-CM

## 2014-02-07 DIAGNOSIS — T45515A Adverse effect of anticoagulants, initial encounter: Secondary | ICD-10-CM | POA: Diagnosis present

## 2014-02-07 DIAGNOSIS — N184 Chronic kidney disease, stage 4 (severe): Secondary | ICD-10-CM | POA: Diagnosis present

## 2014-02-07 DIAGNOSIS — Z79899 Other long term (current) drug therapy: Secondary | ICD-10-CM

## 2014-02-07 DIAGNOSIS — I129 Hypertensive chronic kidney disease with stage 1 through stage 4 chronic kidney disease, or unspecified chronic kidney disease: Secondary | ICD-10-CM | POA: Diagnosis present

## 2014-02-07 DIAGNOSIS — N259 Disorder resulting from impaired renal tubular function, unspecified: Secondary | ICD-10-CM

## 2014-02-07 DIAGNOSIS — E538 Deficiency of other specified B group vitamins: Secondary | ICD-10-CM | POA: Diagnosis present

## 2014-02-07 DIAGNOSIS — Z0000 Encounter for general adult medical examination without abnormal findings: Secondary | ICD-10-CM

## 2014-02-07 DIAGNOSIS — S22000A Wedge compression fracture of unspecified thoracic vertebra, initial encounter for closed fracture: Secondary | ICD-10-CM

## 2014-02-07 DIAGNOSIS — I4891 Unspecified atrial fibrillation: Secondary | ICD-10-CM | POA: Diagnosis present

## 2014-02-07 DIAGNOSIS — M545 Low back pain, unspecified: Secondary | ICD-10-CM

## 2014-02-07 DIAGNOSIS — R791 Abnormal coagulation profile: Secondary | ICD-10-CM | POA: Diagnosis present

## 2014-02-07 DIAGNOSIS — K219 Gastro-esophageal reflux disease without esophagitis: Secondary | ICD-10-CM | POA: Diagnosis present

## 2014-02-07 DIAGNOSIS — H9190 Unspecified hearing loss, unspecified ear: Secondary | ICD-10-CM | POA: Diagnosis present

## 2014-02-07 DIAGNOSIS — I10 Essential (primary) hypertension: Secondary | ICD-10-CM

## 2014-02-07 DIAGNOSIS — R5383 Other fatigue: Secondary | ICD-10-CM

## 2014-02-07 DIAGNOSIS — K6289 Other specified diseases of anus and rectum: Secondary | ICD-10-CM

## 2014-02-07 DIAGNOSIS — R5381 Other malaise: Secondary | ICD-10-CM | POA: Diagnosis present

## 2014-02-07 DIAGNOSIS — R109 Unspecified abdominal pain: Secondary | ICD-10-CM

## 2014-02-07 DIAGNOSIS — N189 Chronic kidney disease, unspecified: Secondary | ICD-10-CM | POA: Diagnosis present

## 2014-02-07 DIAGNOSIS — IMO0002 Reserved for concepts with insufficient information to code with codable children: Secondary | ICD-10-CM | POA: Diagnosis present

## 2014-02-07 DIAGNOSIS — Z5181 Encounter for therapeutic drug level monitoring: Secondary | ICD-10-CM

## 2014-02-07 DIAGNOSIS — M25512 Pain in left shoulder: Secondary | ICD-10-CM

## 2014-02-07 DIAGNOSIS — M79672 Pain in left foot: Secondary | ICD-10-CM

## 2014-02-07 DIAGNOSIS — R269 Unspecified abnormalities of gait and mobility: Secondary | ICD-10-CM

## 2014-02-07 DIAGNOSIS — Z794 Long term (current) use of insulin: Secondary | ICD-10-CM

## 2014-02-07 DIAGNOSIS — K5909 Other constipation: Secondary | ICD-10-CM | POA: Diagnosis present

## 2014-02-07 DIAGNOSIS — Z87440 Personal history of urinary (tract) infections: Secondary | ICD-10-CM

## 2014-02-07 DIAGNOSIS — I5032 Chronic diastolic (congestive) heart failure: Secondary | ICD-10-CM | POA: Diagnosis present

## 2014-02-07 DIAGNOSIS — I951 Orthostatic hypotension: Secondary | ICD-10-CM | POA: Diagnosis present

## 2014-02-07 DIAGNOSIS — E785 Hyperlipidemia, unspecified: Secondary | ICD-10-CM | POA: Diagnosis present

## 2014-02-07 DIAGNOSIS — M8448XA Pathological fracture, other site, initial encounter for fracture: Principal | ICD-10-CM | POA: Diagnosis present

## 2014-02-07 DIAGNOSIS — R509 Fever, unspecified: Secondary | ICD-10-CM

## 2014-02-07 DIAGNOSIS — F329 Major depressive disorder, single episode, unspecified: Secondary | ICD-10-CM

## 2014-02-07 DIAGNOSIS — N281 Cyst of kidney, acquired: Secondary | ICD-10-CM

## 2014-02-07 DIAGNOSIS — J309 Allergic rhinitis, unspecified: Secondary | ICD-10-CM

## 2014-02-07 DIAGNOSIS — R55 Syncope and collapse: Secondary | ICD-10-CM

## 2014-02-07 DIAGNOSIS — H918X9 Other specified hearing loss, unspecified ear: Secondary | ICD-10-CM

## 2014-02-07 DIAGNOSIS — I509 Heart failure, unspecified: Secondary | ICD-10-CM | POA: Diagnosis present

## 2014-02-07 DIAGNOSIS — H47029 Hemorrhage in optic nerve sheath, unspecified eye: Secondary | ICD-10-CM

## 2014-02-07 DIAGNOSIS — E119 Type 2 diabetes mellitus without complications: Secondary | ICD-10-CM | POA: Diagnosis present

## 2014-02-07 DIAGNOSIS — Z7901 Long term (current) use of anticoagulants: Secondary | ICD-10-CM

## 2014-02-07 DIAGNOSIS — F3289 Other specified depressive episodes: Secondary | ICD-10-CM

## 2014-02-07 HISTORY — DX: Unspecified osteoarthritis, unspecified site: M19.90

## 2014-02-07 HISTORY — DX: Heart failure, unspecified: I50.9

## 2014-02-07 HISTORY — DX: Wedge compression fracture of unspecified thoracic vertebra, initial encounter for closed fracture: S22.000A

## 2014-02-07 HISTORY — DX: Calculus of kidney: N20.0

## 2014-02-07 HISTORY — DX: Wedge compression fracture of unspecified lumbar vertebra, initial encounter for closed fracture: S32.000A

## 2014-02-07 HISTORY — DX: Acute myocardial infarction, unspecified: I21.9

## 2014-02-07 HISTORY — DX: Acute embolism and thrombosis of unspecified deep veins of unspecified lower extremity: I82.409

## 2014-02-07 HISTORY — DX: Malignant melanoma of nose: C43.31

## 2014-02-07 HISTORY — DX: Family history of other specified conditions: Z84.89

## 2014-02-07 HISTORY — DX: Pneumonitis due to inhalation of food and vomit: J69.0

## 2014-02-07 LAB — CBC WITH DIFFERENTIAL/PLATELET
BASOS ABS: 0 10*3/uL (ref 0.0–0.1)
BASOS PCT: 0 % (ref 0–1)
EOS ABS: 0 10*3/uL (ref 0.0–0.7)
Eosinophils Relative: 0 % (ref 0–5)
HCT: 40.5 % (ref 36.0–46.0)
Hemoglobin: 12.8 g/dL (ref 12.0–15.0)
Lymphocytes Relative: 7 % — ABNORMAL LOW (ref 12–46)
Lymphs Abs: 0.5 10*3/uL — ABNORMAL LOW (ref 0.7–4.0)
MCH: 28.4 pg (ref 26.0–34.0)
MCHC: 31.6 g/dL (ref 30.0–36.0)
MCV: 89.8 fL (ref 78.0–100.0)
Monocytes Absolute: 0.1 10*3/uL (ref 0.1–1.0)
Monocytes Relative: 2 % — ABNORMAL LOW (ref 3–12)
Neutro Abs: 6.3 10*3/uL (ref 1.7–7.7)
Neutrophils Relative %: 91 % — ABNORMAL HIGH (ref 43–77)
PLATELETS: 146 10*3/uL — AB (ref 150–400)
RBC: 4.51 MIL/uL (ref 3.87–5.11)
RDW: 16 % — AB (ref 11.5–15.5)
WBC: 6.9 10*3/uL (ref 4.0–10.5)

## 2014-02-07 LAB — URINALYSIS, ROUTINE W REFLEX MICROSCOPIC
BILIRUBIN URINE: NEGATIVE
Glucose, UA: NEGATIVE mg/dL
Ketones, ur: NEGATIVE mg/dL
LEUKOCYTES UA: NEGATIVE
Nitrite: NEGATIVE
Protein, ur: NEGATIVE mg/dL
Specific Gravity, Urine: 1.012 (ref 1.005–1.030)
UROBILINOGEN UA: 0.2 mg/dL (ref 0.0–1.0)
pH: 6.5 (ref 5.0–8.0)

## 2014-02-07 LAB — GLUCOSE, CAPILLARY
GLUCOSE-CAPILLARY: 134 mg/dL — AB (ref 70–99)
Glucose-Capillary: 209 mg/dL — ABNORMAL HIGH (ref 70–99)

## 2014-02-07 LAB — COMPREHENSIVE METABOLIC PANEL
ALBUMIN: 3.9 g/dL (ref 3.5–5.2)
ALK PHOS: 66 U/L (ref 39–117)
ALT: 18 U/L (ref 0–35)
AST: 22 U/L (ref 0–37)
BUN: 38 mg/dL — AB (ref 6–23)
CO2: 21 mEq/L (ref 19–32)
Calcium: 10.3 mg/dL (ref 8.4–10.5)
Chloride: 102 mEq/L (ref 96–112)
Creatinine, Ser: 1.38 mg/dL — ABNORMAL HIGH (ref 0.50–1.10)
GFR calc Af Amer: 38 mL/min — ABNORMAL LOW (ref >90)
GFR calc non Af Amer: 33 mL/min — ABNORMAL LOW (ref >90)
Glucose, Bld: 161 mg/dL — ABNORMAL HIGH (ref 70–99)
POTASSIUM: 5.5 meq/L — AB (ref 3.7–5.3)
Sodium: 137 mEq/L (ref 137–147)
Total Bilirubin: 0.3 mg/dL (ref 0.3–1.2)
Total Protein: 6.7 g/dL (ref 6.0–8.3)

## 2014-02-07 LAB — PROTIME-INR
INR: 2.67 — ABNORMAL HIGH (ref 0.00–1.49)
Prothrombin Time: 27.5 seconds — ABNORMAL HIGH (ref 11.6–15.2)

## 2014-02-07 LAB — VITAMIN B12: VITAMIN B 12: 520 pg/mL (ref 211–911)

## 2014-02-07 LAB — HEMOGLOBIN A1C
Hgb A1c MFr Bld: 6.5 % — ABNORMAL HIGH (ref <5.7)
MEAN PLASMA GLUCOSE: 140 mg/dL — AB (ref <117)

## 2014-02-07 LAB — URINE MICROSCOPIC-ADD ON

## 2014-02-07 LAB — PHOSPHORUS: PHOSPHORUS: 3 mg/dL (ref 2.3–4.6)

## 2014-02-07 LAB — TROPONIN I
Troponin I: <0.3 ng/mL (ref <0.30)
Troponin I: <0.3 ng/mL (ref <0.30)

## 2014-02-07 LAB — MAGNESIUM: Magnesium: 1.7 mg/dL (ref 1.5–2.5)

## 2014-02-07 LAB — TSH: TSH: 3.18 u[IU]/mL (ref 0.350–4.500)

## 2014-02-07 MED ORDER — ARTIFICIAL TEARS OP OINT
TOPICAL_OINTMENT | OPHTHALMIC | Status: DC | PRN
  Administered 2014-02-09: 10:00:00 via OPHTHALMIC
  Filled 2014-02-07: qty 3.5

## 2014-02-07 MED ORDER — ONDANSETRON HCL 4 MG/2ML IJ SOLN: 4.0000 mg | Freq: Four times a day (QID) | INTRAMUSCULAR | Status: DC | PRN

## 2014-02-07 MED ORDER — LABETALOL HCL 200 MG PO TABS
400.0000 mg | ORAL_TABLET | Freq: Two times a day (BID) | ORAL | Status: DC
  Administered 2014-02-07 – 2014-02-09 (×4): 400 mg via ORAL
  Filled 2014-02-07 (×5): qty 2

## 2014-02-07 MED ORDER — FERROUS SULFATE 325 (65 FE) MG PO TABS
325.0000 mg | ORAL_TABLET | Freq: Two times a day (BID) | ORAL | Status: DC
  Administered 2014-02-07 – 2014-02-09 (×5): 325 mg via ORAL
  Filled 2014-02-07 (×6): qty 1

## 2014-02-07 MED ORDER — SIMVASTATIN 10 MG PO TABS
10.0000 mg | ORAL_TABLET | Freq: Every day | ORAL | Status: DC
  Administered 2014-02-07 – 2014-02-08 (×2): 10 mg via ORAL
  Filled 2014-02-07 (×3): qty 1

## 2014-02-07 MED ORDER — LORATADINE 10 MG PO TABS
10.0000 mg | ORAL_TABLET | Freq: Every day | ORAL | Status: DC
  Administered 2014-02-07 – 2014-02-09 (×3): 10 mg via ORAL
  Filled 2014-02-07 (×3): qty 1

## 2014-02-07 MED ORDER — INSULIN GLARGINE 100 UNIT/ML ~~LOC~~ SOLN
7.0000 [IU] | Freq: Every day | SUBCUTANEOUS | Status: DC
  Administered 2014-02-08: 7 [IU] via SUBCUTANEOUS
  Filled 2014-02-07 (×2): qty 0.07

## 2014-02-07 MED ORDER — CLONIDINE HCL 0.3 MG/24HR TD PTWK: 0.3000 mg | MEDICATED_PATCH | TRANSDERMAL | Status: DC

## 2014-02-07 MED ORDER — SULFAMETHOXAZOLE-TMP DS 800-160 MG PO TABS
1.0000 | ORAL_TABLET | Freq: Every day | ORAL | Status: DC
  Administered 2014-02-07 – 2014-02-08 (×2): 1 via ORAL
  Filled 2014-02-07 (×3): qty 1

## 2014-02-07 MED ORDER — ACETAMINOPHEN 325 MG PO TABS
650.0000 mg | ORAL_TABLET | Freq: Once | ORAL | Status: AC
  Administered 2014-02-07: 650 mg via ORAL
  Filled 2014-02-07: qty 2

## 2014-02-07 MED ORDER — ACETAMINOPHEN 325 MG PO TABS: 650.0000 mg | ORAL_TABLET | Freq: Four times a day (QID) | ORAL | Status: DC | PRN

## 2014-02-07 MED ORDER — ONDANSETRON HCL 4 MG PO TABS: 4.0000 mg | ORAL_TABLET | Freq: Four times a day (QID) | ORAL | Status: DC | PRN

## 2014-02-07 MED ORDER — FLUTICASONE PROPIONATE 50 MCG/ACT NA SUSP
2.0000 | Freq: Every day | NASAL | Status: DC
  Administered 2014-02-08 – 2014-02-09 (×2): 2 via NASAL
  Filled 2014-02-07: qty 16

## 2014-02-07 MED ORDER — MORPHINE SULFATE 2 MG/ML IJ SOLN
1.0000 mg | Freq: Once | INTRAMUSCULAR | Status: AC
  Administered 2014-02-07: 1 mg via INTRAVENOUS
  Filled 2014-02-07: qty 1

## 2014-02-07 MED ORDER — SODIUM CHLORIDE 0.9 % IJ SOLN
3.0000 mL | Freq: Two times a day (BID) | INTRAMUSCULAR | Status: DC
  Administered 2014-02-07 – 2014-02-09 (×4): 3 mL via INTRAVENOUS

## 2014-02-07 MED ORDER — WARFARIN - PHARMACIST DOSING INPATIENT: Freq: Every day | Status: DC

## 2014-02-07 MED ORDER — SODIUM POLYSTYRENE SULFONATE 15 GM/60ML PO SUSP
15.0000 g | Freq: Once | ORAL | Status: AC
  Administered 2014-02-07: 15 g via ORAL
  Filled 2014-02-07: qty 60

## 2014-02-07 MED ORDER — SODIUM CHLORIDE 0.9 % IV BOLUS (SEPSIS)
500.0000 mL | Freq: Once | INTRAVENOUS | Status: AC
  Administered 2014-02-07: 500 mL via INTRAVENOUS

## 2014-02-07 MED ORDER — MORPHINE SULFATE 2 MG/ML IJ SOLN
1.0000 mg | INTRAMUSCULAR | Status: DC | PRN
  Administered 2014-02-07 – 2014-02-09 (×5): 1 mg via INTRAVENOUS
  Filled 2014-02-07 (×5): qty 1

## 2014-02-07 MED ORDER — PANTOPRAZOLE SODIUM 40 MG PO TBEC
40.0000 mg | DELAYED_RELEASE_TABLET | Freq: Every day | ORAL | Status: DC
  Administered 2014-02-07 – 2014-02-09 (×3): 40 mg via ORAL
  Filled 2014-02-07 (×3): qty 1

## 2014-02-07 MED ORDER — OXYCODONE HCL 5 MG PO TABS
5.0000 mg | ORAL_TABLET | ORAL | Status: DC | PRN
  Administered 2014-02-07 – 2014-02-09 (×3): 5 mg via ORAL
  Filled 2014-02-07 (×3): qty 1

## 2014-02-07 MED ORDER — SODIUM CHLORIDE 0.9 % IV SOLN
250.0000 mL | INTRAVENOUS | Status: DC | PRN
Start: 2014-02-07 — End: 2014-02-09

## 2014-02-07 MED ORDER — ACETAMINOPHEN 650 MG RE SUPP: 650.0000 mg | Freq: Four times a day (QID) | RECTAL | Status: DC | PRN

## 2014-02-07 MED ORDER — ONDANSETRON HCL 4 MG/2ML IJ SOLN
4.0000 mg | Freq: Once | INTRAMUSCULAR | Status: AC
  Administered 2014-02-07: 4 mg via INTRAVENOUS
  Filled 2014-02-07: qty 2

## 2014-02-07 MED ORDER — SODIUM CHLORIDE 0.9 % IJ SOLN
3.0000 mL | Freq: Two times a day (BID) | INTRAMUSCULAR | Status: DC
  Administered 2014-02-07: 3 mL via INTRAVENOUS

## 2014-02-07 MED ORDER — SODIUM CHLORIDE 0.9 % IJ SOLN: 3.0000 mL | INTRAMUSCULAR | Status: DC | PRN

## 2014-02-07 MED ORDER — POLYETHYLENE GLYCOL 3350 17 G PO PACK
17.0000 g | PACK | Freq: Every day | ORAL | Status: DC | PRN
  Administered 2014-02-08: 17 g via ORAL
  Filled 2014-02-07 (×2): qty 1

## 2014-02-07 MED ORDER — POLYETHYLENE GLYCOL 3350 17 G PO PACK
17.0000 g | PACK | Freq: Every day | ORAL | Status: DC
  Filled 2014-02-07: qty 1

## 2014-02-07 MED ORDER — WARFARIN SODIUM 2.5 MG PO TABS
2.5000 mg | ORAL_TABLET | Freq: Once | ORAL | Status: DC
  Administered 2014-02-07: 2.5 mg via ORAL
  Filled 2014-02-07 (×3): qty 1

## 2014-02-07 MED ORDER — INSULIN ASPART 100 UNIT/ML ~~LOC~~ SOLN
0.0000 [IU] | Freq: Three times a day (TID) | SUBCUTANEOUS | Status: DC
  Administered 2014-02-07: 1 [IU] via SUBCUTANEOUS
  Administered 2014-02-08 – 2014-02-09 (×4): 3 [IU] via SUBCUTANEOUS
  Administered 2014-02-09: 5 [IU] via SUBCUTANEOUS

## 2014-02-07 NOTE — ED Notes (Signed)
Patient transported to CT 

## 2014-02-07 NOTE — Progress Notes (Signed)
OT NOTE  Pt seen for initial eval. Full note to follow. At this time, rec CIR consult.  [facility], OTR/L  [phone] 02/07/2014

## 2014-02-07 NOTE — Progress Notes (Signed)
ANTICOAGULATION CONSULT NOTE - Initial Consult  Pharmacy Consult for Coumadin Indication: atrial fibrillation  Allergies  Allergen Reactions  . Amiodarone     REACTION: f? fluid retention  . Deltasone [Prednisone]   . Diazepam     REACTION: agitation  . Diltiazem Hcl   . Levofloxacin     REACTION: nausea  . Pregabalin     REACTION: hallucinations  . Spironolactone     REACTION: elevated K at lowest dose  . Tequin [Gatifloxacin]     Patient Measurements: Height: 5\' 6"  (167.6 cm) Weight: 130 lb (58.968 kg) IBW/kg (Calculated) : 59.3  Vital Signs: Temp: 96.7 F (35.9 C) (06/09 0840) Temp src: Rectal (06/09 0840) BP: 161/85 mmHg (06/09 1300) Pulse Rate: 94 (06/09 1300)  Labs:  Recent Labs  02/07/14 0825  HGB 12.8  HCT 40.5  PLT 146*  LABPROT 27.5*  INR 2.67*  CREATININE 1.38*  TROPONINI <0.30    Estimated Creatinine Clearance: 25.2 ml/min (by C-G formula based on Cr of 1.38).   Medical History: Past Medical History  Diagnosis Date  . POSTHERPETIC NEURALGIA 11/02/2009  . DIABETES MELLITUS, TYPE II 09/06/2007  . B12 DEFICIENCY 05/23/2007  . HYPERLIPIDEMIA 05/23/2007  . HYPERKALEMIA 05/30/2010  . ANEMIA-IRON DEFICIENCY 01/26/2007  . DEPRESSIVE DISORDER 01/25/2008  . BLEPHARITIS, BILATERAL 02/23/2008  . Other specified forms of hearing loss 05/30/2010  . HYPERTENSION 01/26/2007  . Atrial fibrillation 05/23/2007  . DIASTOLIC HEART FAILURE, CHRONIC 12/14/2009  . POSTURAL HYPOTENSION 02/07/2008  . ALLERGIC RHINITIS 11/02/2009  . GERD 01/26/2007  . CONSTIPATION 01/25/2008  . RENAL INSUFFICIENCY 07/02/2009  . RENAL CYST, LEFT 09/06/2007  . SKIN LESION 05/30/2010  . BACK PAIN 08/02/2007  . OSTEOPOROSIS 05/23/2007  . SYNCOPE 02/07/2008  . FATIGUE 09/06/2007  . Dysuria 09/05/2008  . Abdominal pain, epigastric 09/06/2007  . EPIGASTRIC TENDERNESS 10/12/2007  . PULMONARY EMBOLISM, HX OF 05/23/2007  . SHINGLES, HX OF 05/23/2007  . Pulmonary embolism 10/14/2010  . Optic nerve hemorrhage  12/23/2010  . Left foot pain   . Pneumonia   . Status post dilation of esophageal narrowing   . Diabetic neuropathy     Medications:  See electronic med rec  Assessment: 80 y.o. female presents after fall (did not hit head per pt report). Pt on coumadin PTA for afib. CHADS-VASc score = 6. Baseline INR 2.67 on home dose of 2.5mg  daily (last taken 6/8). CBC ok at baseline.  Goal of Therapy:  INR 2-3 Monitor platelets by anticoagulation protocol: Yes   Plan:  1. Coumadin 2.5mg  daily 2. Daily INR  Sherlon Handing, PharmD, [place] Clinical pharmacist, pager [phone] 02/07/2014,2:23 PM

## 2014-02-07 NOTE — ED Notes (Signed)
Pt in from home via [facility], per report pt was attempting to stand from toilet & fell onto floor, denies hitting head & LOC, reports spinal tenderness starting at neck & radiating down spine, moves all extremities, reports spinal pain on movement, pt presents to ED in C collar & KED, pt A&O x4, follows commands, speaks in complete sentences

## 2014-02-07 NOTE — H&P (Signed)
[facility] History and Physical  Lindsay Sheppard DQQ:[id number] DOB: [birth year] DOA: 02/07/2014  Referring physician: Dr. Jeanell Sparrow PCP: [name], MD   Chief Complaint: Fall and back pain  HPI: Lindsay Sheppard is a 80 y.o. female with PMH of HTN, diabetes, HLD, B12 deficiency, CKD stage 3; recurrent UTI (on suppression therapy with bactrim); atrial fibrillation ( on coumadin), GERD and anemia; came to ED after experiencing a falling episode at home. Patient reports severe back pain. Patient is unsure if she pass out or just slipped. Had hx in the past of orthostatic hypotension and syncope. Denies CP, fever, chills, nausea, vomiting, abd pain, HA's or focal motor deficit. In ED she was found to have mild hyperkalemia and severe back pain; CT thorax demonstrated 7 compression fractures. Given concerns for syncope and new deconditioning [facility] has been called to admit patient for further evaluation and treatment.   Review of Systems:  Negative except as mentioned on HPI.  Past Medical History  Diagnosis Date  . POSTHERPETIC NEURALGIA 11/02/2009  . DIABETES MELLITUS, TYPE II 09/06/2007  . B12 DEFICIENCY 05/23/2007  . HYPERLIPIDEMIA 05/23/2007  . HYPERKALEMIA 05/30/2010  . ANEMIA-IRON DEFICIENCY 01/26/2007  . DEPRESSIVE DISORDER 01/25/2008  . BLEPHARITIS, BILATERAL 02/23/2008  . Other specified forms of hearing loss 05/30/2010  . HYPERTENSION 01/26/2007  . Atrial fibrillation 05/23/2007  . DIASTOLIC HEART FAILURE, CHRONIC 12/14/2009  . POSTURAL HYPOTENSION 02/07/2008  . ALLERGIC RHINITIS 11/02/2009  . GERD 01/26/2007  . CONSTIPATION 01/25/2008  . RENAL INSUFFICIENCY 07/02/2009  . RENAL CYST, LEFT 09/06/2007  . SKIN LESION 05/30/2010  . BACK PAIN 08/02/2007  . OSTEOPOROSIS 05/23/2007  . SYNCOPE 02/07/2008  . FATIGUE 09/06/2007  . Dysuria 09/05/2008  . Abdominal pain, epigastric 09/06/2007  . EPIGASTRIC TENDERNESS 10/12/2007  . PULMONARY EMBOLISM, HX OF 05/23/2007  . SHINGLES, HX OF 05/23/2007  . Pulmonary embolism  10/14/2010  . 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 Left foot pain   . Pneumonia   . Status post dilation of esophageal narrowing   . Diabetic neuropathy    Past Surgical History  Procedure Laterality Date  . Appendectomy  1943  . Abdominal hysterectomy  1964  . Cardiac electrophysiology mapping and ablation     Social History:  reports that she has never smoked. She has never used smokeless tobacco. She reports that she does not drink alcohol or use illicit drugs.  Allergies  Allergen Reactions  . Amiodarone     REACTION: f? fluid retention  . Deltasone [Prednisone]   . Diazepam     REACTION: agitation  . Diltiazem Hcl   . Levofloxacin     REACTION: nausea  . Pregabalin     REACTION: hallucinations  . Spironolactone     REACTION: elevated K at lowest dose  . Tequin [Gatifloxacin]     Family History  Problem Relation Age of Onset  . Breast cancer Mother   . Colon cancer Father   . Stroke Father     died with stroke postop  . Diabetes Sister     2 sisters  . Hypertension Other      Prior to Admission medications   Medication Sig Start Date End Date Taking? Authorizing Provider  cloNIDine (CATAPRES - DOSED IN MG/24 HR) 0.3 mg/24hr patch Place 1 patch (0.3 mg total) onto the skin every 7 (seven) days. 12/15/13 12/26/14 Yes Biagio Borg, MD  Cyanocobalamin (VITAMIN B-12 IJ) Inject as directed every 30 (thirty) days.   Yes Historical Provider, MD  ferrous sulfate 325 (65 FE) MG tablet Take 325 mg by mouth 2 (two) times daily with a meal.    Yes Historical Provider, MD  fexofenadine (ALLEGRA) 180 MG tablet Take 180 mg by mouth every evening.    Yes Historical Provider, MD  fluticasone (FLONASE) 50 MCG/ACT nasal spray Place 2 sprays into the nose daily. 03/22/13  Yes Biagio Borg, MD  furosemide (LASIX) 20 MG tablet Take every other day 11/03/12  Yes Biagio Borg, MD  Hypromellose (ARTIFICIAL TEARS OP) Place 1 drop into both eyes daily.   Yes Historical Provider, MD  insulin  glargine (LANTUS) 100 UNIT/ML injection Inject 7 units subcutaneously once a day 06/03/11  Yes Biagio Borg, MD  labetalol (NORMODYNE) 200 MG tablet Take 400 mg by mouth 2 (two) times daily.   Yes Historical Provider, MD  metFORMIN (GLUCOPHAGE) 500 MG tablet Take 1 tablet (500 mg total) by mouth 2 (two) times daily with a meal. 1 po twice per day 07/20/13  Yes Biagio Borg, MD  pantoprazole (PROTONIX) 40 MG tablet Take 1 tablet (40 mg total) by mouth daily. 07/20/13  Yes Biagio Borg, MD  polyethylene glycol [facility] / Floria Raveling) packet Take 17 g by mouth daily.   Yes Historical Provider, MD  pravastatin (PRAVACHOL) 40 MG tablet Take 0.5 tablets (20 mg total) by mouth daily. 1/2 daily 07/20/13  Yes Biagio Borg, MD  sulfamethoxazole-trimethoprim (BACTRIM DS,SEPTRA DS) 800-160 MG per tablet Take 1 tablet by mouth daily. 07/25/13  Yes Biagio Borg, MD  warfarin (COUMADIN) 2.5 MG tablet Take 2.5 mg by mouth every evening.   Yes Historical Provider, MD   Physical Exam: Filed Vitals:   02/07/14 1300  BP: 161/85  Pulse: 94  Temp:   Resp: 22    BP 161/85  Pulse 94  Temp(Src) 96.7 F (35.9 C) (Rectal)  Resp 22  Ht 5\' 6"  (1.676 m)  Wt 58.968 kg (130 lb)  BMI 20.99 kg/m2  SpO2 95%  General:  Appears in mild distress due to back pain.  Eyes: PERRL, normal lids, irises & conjunctiva; no nystagmus ENT: hard of hearing, MMM, no erythema or exudates inside his mouth Neck: no LAD, masses or thyromegaly; no JVD Cardiovascular: regular rate, no rubs or gallops; no LE edema Respiratory: CTA bilaterally, no w/r/r. Normal respiratory effort. Abdomen: soft, nt, nd; positive BS Skin: no rash or induration seen on exam Musculoskeletal: grossly normal tone BUE/BLE Psychiatric: grossly normal mood and affect, speech fluent and appropriate Neurologic: grossly non-focal.          Labs on Admission:  Basic Metabolic Panel:  Recent Labs Lab 02/07/14 0825  NA 137  K 5.5*  CL 102  CO2 21  GLUCOSE  161*  BUN 38*  CREATININE 1.38*  CALCIUM 10.3   Liver Function Tests:  Recent Labs Lab 02/07/14 0825  AST 22  ALT 18  ALKPHOS 66  BILITOT 0.3  PROT 6.7  ALBUMIN 3.9   CBC:  Recent Labs Lab 02/07/14 0825  WBC 6.9  NEUTROABS 6.3  HGB 12.8  HCT 40.5  MCV 89.8  PLT 146*   Cardiac Enzymes:  Recent Labs Lab 02/07/14 0825  TROPONINI <0.30    Radiological Exams on Admission: Dg Chest 1 View  02/07/2014   CLINICAL DATA:  Back and chest pain secondary to a fall.  EXAM: CHEST - 1 VIEW  COMPARISON:  10/04/2013  FINDINGS: Heart size and pulmonary vascularity are normal. There is slight chronic could  peribronchial thickening. No infiltrates or effusions. No definitive acute osseous abnormality. There are compression deformities at multiple levels in the thoracic spine these appear unchanged since the prior study.  IMPRESSION: No acute abnormalities.   Electronically Signed   By: Rozetta Nunnery M.D.   On: 02/07/2014 09:17   Ct Head Wo Contrast  02/07/2014   CLINICAL DATA:  Neck and back pain secondary to a fall this morning.  EXAM: CT HEAD WITHOUT CONTRAST  CT CERVICAL SPINE WITHOUT CONTRAST  TECHNIQUE: Multidetector CT imaging of the head and cervical spine was performed following the standard protocol without intravenous contrast. Multiplanar CT image reconstructions of the cervical spine were also generated.  COMPARISON:  CT scan of the head dated 07/15/2008  FINDINGS: CT HEAD FINDINGS  No mass lesion. No midline shift. No acute hemorrhage or hematoma. No extra-axial fluid collections. No evidence of acute infarction. There is mild diffuse cerebral cortical atrophy without ventricular dilatation. No osseous abnormality.  CT CERVICAL SPINE FINDINGS  There is no acute fracture or prevertebral soft tissue swelling. There is a 1.5 mm spondylolisthesis of C6 on C7 due to moderate left facet arthritis. There is narrowing of the C5-6 disc space. There is a small central disc protrusion at C3-4  with no neural impingement. There is multilevel moderately severe left facet arthritis from C3-4 through C6-7. The C2-3 level is fused.  IMPRESSION: 1. No acute intracranial abnormality. 2. No acute abnormality of the cervical spine.   Electronically Signed   By: Rozetta Nunnery M.D.   On: 02/07/2014 10:09   Ct Cervical Spine Wo Contrast  02/07/2014   CLINICAL DATA:  Neck and back pain secondary to a fall this morning.  EXAM: CT HEAD WITHOUT CONTRAST  CT CERVICAL SPINE WITHOUT CONTRAST  TECHNIQUE: Multidetector CT imaging of the head and cervical spine was performed following the standard protocol without intravenous contrast. Multiplanar CT image reconstructions of the cervical spine were also generated.  COMPARISON:  CT scan of the head dated 07/15/2008  FINDINGS: CT HEAD FINDINGS  No mass lesion. No midline shift. No acute hemorrhage or hematoma. No extra-axial fluid collections. No evidence of acute infarction. There is mild diffuse cerebral cortical atrophy without ventricular dilatation. No osseous abnormality.  CT CERVICAL SPINE FINDINGS  There is no acute fracture or prevertebral soft tissue swelling. There is a 1.5 mm spondylolisthesis of C6 on C7 due to moderate left facet arthritis. There is narrowing of the C5-6 disc space. There is a small central disc protrusion at C3-4 with no neural impingement. There is multilevel moderately severe left facet arthritis from C3-4 through C6-7. The C2-3 level is fused.  IMPRESSION: 1. No acute intracranial abnormality. 2. No acute abnormality of the cervical spine.   Electronically Signed   By: Rozetta Nunnery M.D.   On: 02/07/2014 10:09   Ct Thoracic Spine Wo Contrast  02/07/2014   CLINICAL DATA:  80 year old woman following fall. Back pain. Spinal tenderness at the neck and radiating through the lumbar spine.  EXAM: CT THORACIC AND LUMBAR SPINE WITHOUT CONTRAST  TECHNIQUE: Multidetector CT imaging of the thoracic and lumbar spine was performed without contrast.  Multiplanar CT image reconstructions were also generated.  COMPARISON:  Cervical spine CT 02/07/2014.  Abdominal CT 01/28/2008.  FINDINGS: CT THORACIC SPINE FINDINGS  Cervical rib its are present bilaterally at C7. Numbering was performed contiguously from the craniocervical junction on prior cervical spine CT and the cervical ribs as references. In the lumbar spine, there are 5 non-rib-bearing  lumbar type vertebral bodies with rudimentary ribs at T12. Sacralization of the transverse processes of L5. Levoconvex curve of the thoracolumbar spine with the apex at T12. Multilevel compression fractures are present. Paraspinal soft tissues demonstrate a 16 mm left thyroid lobe cystic lesion. Atherosclerosis is present including coronary artery disease. Lungs show extensive calcification of the tracheobronchial tree. Small hiatal hernia incidentally noted.  No gross hematomas identified in the central canal. There is no definite thoracic paravertebral phlegmon to suggest an acute compression fracture. Mild T2 superior endplate compression fracture. T3 a also shows a superior endplate compression fracture. T4 and T5 show preserved height and there appears to be ossification across the disc space and ankylosis. T6 shows a superior endplate compression fracture with 20% loss height and no retropulsion. T7 shows 25% loss of vertebral body height. T8 is preserved. T9 shows about 10% loss of vertebral body height without retropulsion. T10 is intact. T11 shows a superior endplate compression fracture with 25% loss of vertebral body height and mild retropulsion. T12 shows a biconcave compression fracture with 25% loss of vertebral body height but no retropulsion.  Osseous ridging is present throughout the thoracolumbar spine along with some calcified disc protrusions. No critical central stenosis is identified.  CT LUMBAR SPINE FINDINGS  Thoracic spinal alignment appears unchanged compared MRI 03/08/2012. Numbering convention used  on the prior MRI is preserved. Sacral irregularity is present best seen on the sagittal images, which is a chronic finding seen on the MRI 03/18/2012.  Bilateral SI joint degenerative disease is present. Sclerotic lesion present in the left sacral ala appears new compared to CT 2009. This measures 8 mm and is indeterminate.  T12 biconcave compression fracture appears similar to prior MRI. L2 superior endplate compression fracture with retropulsion also appears unchanged. L3 shows preserved vertebral body height. L4 and L5 also show preserved vertebral body height without an acute fracture. Severe L4-L5 degenerative disc disease. Allowing for differences in technique, there is little if any interval change compared to prior MRI. Paraspinal soft tissues demonstrate dense aortoiliac and visceral atherosclerosis. Unchanged nodule in the left adrenal gland which may represent a large adenoma or myelolipoma.  IMPRESSION: CT THORACIC SPINE IMPRESSION  1. 16 mm left thyroid lobe cystic lesion. Consider further evaluation with thyroid ultrasound. If patient is clinically hyperthyroid, consider nuclear medicine thyroid uptake and scan. 2. Multilevel compression fractures detailed above. No definite paravertebral phlegmon to suggest an acute thoracic compression fracture. 3. Because of the multiplicity of thoracic compression fractures, MRI of the thoracic and lumbar spine should be considered to assess for bone marrow edema.  CT LUMBAR SPINE IMPRESSION  1. Indeterminate 8 mm sclerotic lesion in the left sacral ala, new compared to 2009. 2. Old sacral fracture demonstrated on prior MRI. 3. Chronic L1 and L2 compression fractures with retropulsion of L2. No definite acute osseous abnormality in the lumbar spine compared to prior MRI 03/18/2012. Moderate central stenosis associated with retropulsion of L2.   Electronically Signed   By: Dereck Ligas M.D.   On: 02/07/2014 10:47   Ct Lumbar Spine Wo Contrast  02/07/2014    CLINICAL DATA:  80 year old woman following fall. Back pain. Spinal tenderness at the neck and radiating through the lumbar spine.  EXAM: CT THORACIC AND LUMBAR SPINE WITHOUT CONTRAST  TECHNIQUE: Multidetector CT imaging of the thoracic and lumbar spine was performed without contrast. Multiplanar CT image reconstructions were also generated.  COMPARISON:  Cervical spine CT 02/07/2014.  Abdominal CT 01/28/2008.  FINDINGS: CT THORACIC  SPINE FINDINGS  Cervical rib its are present bilaterally at C7. Numbering was performed contiguously from the craniocervical junction on prior cervical spine CT and the cervical ribs as references. In the lumbar spine, there are 5 non-rib-bearing lumbar type vertebral bodies with rudimentary ribs at T12. Sacralization of the transverse processes of L5. Levoconvex curve of the thoracolumbar spine with the apex at T12. Multilevel compression fractures are present. Paraspinal soft tissues demonstrate a 16 mm left thyroid lobe cystic lesion. Atherosclerosis is present including coronary artery disease. Lungs show extensive calcification of the tracheobronchial tree. Small hiatal hernia incidentally noted.  No gross hematomas identified in the central canal. There is no definite thoracic paravertebral phlegmon to suggest an acute compression fracture. Mild T2 superior endplate compression fracture. T3 a also shows a superior endplate compression fracture. T4 and T5 show preserved height and there appears to be ossification across the disc space and ankylosis. T6 shows a superior endplate compression fracture with 20% loss height and no retropulsion. T7 shows 25% loss of vertebral body height. T8 is preserved. T9 shows about 10% loss of vertebral body height without retropulsion. T10 is intact. T11 shows a superior endplate compression fracture with 25% loss of vertebral body height and mild retropulsion. T12 shows a biconcave compression fracture with 25% loss of vertebral body height but no  retropulsion.  Osseous ridging is present throughout the thoracolumbar spine along with some calcified disc protrusions. No critical central stenosis is identified.  CT LUMBAR SPINE FINDINGS  Thoracic spinal alignment appears unchanged compared MRI 03/08/2012. Numbering convention used on the prior MRI is preserved. Sacral irregularity is present best seen on the sagittal images, which is a chronic finding seen on the MRI 03/18/2012.  Bilateral SI joint degenerative disease is present. Sclerotic lesion present in the left sacral ala appears new compared to CT 2009. This measures 8 mm and is indeterminate.  T12 biconcave compression fracture appears similar to prior MRI. L2 superior endplate compression fracture with retropulsion also appears unchanged. L3 shows preserved vertebral body height. L4 and L5 also show preserved vertebral body height without an acute fracture. Severe L4-L5 degenerative disc disease. Allowing for differences in technique, there is little if any interval change compared to prior MRI. Paraspinal soft tissues demonstrate dense aortoiliac and visceral atherosclerosis. Unchanged nodule in the left adrenal gland which may represent a large adenoma or myelolipoma.  IMPRESSION: CT THORACIC SPINE IMPRESSION  1. 16 mm left thyroid lobe cystic lesion. Consider further evaluation with thyroid ultrasound. If patient is clinically hyperthyroid, consider nuclear medicine thyroid uptake and scan. 2. Multilevel compression fractures detailed above. No definite paravertebral phlegmon to suggest an acute thoracic compression fracture. 3. Because of the multiplicity of thoracic compression fractures, MRI of the thoracic and lumbar spine should be considered to assess for bone marrow edema.  CT LUMBAR SPINE IMPRESSION  1. Indeterminate 8 mm sclerotic lesion in the left sacral ala, new compared to 2009. 2. Old sacral fracture demonstrated on prior MRI. 3. Chronic L1 and L2 compression fractures with  retropulsion of L2. No definite acute osseous abnormality in the lumbar spine compared to prior MRI 03/18/2012. Moderate central stenosis associated with retropulsion of L2.   Electronically Signed   By: Dereck Ligas M.D.   On: 02/07/2014 10:47    EKG:  Sinus tachycardia, no T waves abnormalities; positive RBBB  Assessment/Plan 1-Fall: unclear if secondary to syncope or mechanical fall  -will admit to telemetry -CT head negative for acute intracranial abnormalities -will check TSH,  B12 and cortisol level -will check orthostatic VS -will check 2-D echo -lasix to be hold today -PT/OT has been ordered -cycle troponin  2-DIABETES MELLITUS, TYPE II: will check A1C, continue lantus and start SSI. -metformin on hold while inpatient  3-hx of B12 DEFICIENCY: will check B12 level  4-HYPERTENSION: fair controlled. -will continue labetalol and clonidine  5-Atrial fibrillation: rate controlled. -continue labetalol and coumadin per pharmacy  6-DIASTOLIC HEART FAILURE, CHRONIC: daily weight, low sodium diet and strict I's and O's -will hold lasix today and reassess volume status daily -2-d echo has been ordered  7-POSTURAL HYPOTENSION: will check orthostatic  8-GERD: continue PPI  9-Hyperkalemia: kayexalate give. No acute abnormalities on EKG.  -Repeat BMET in am. -admitted to telemetry   10-Compression fracture: PT/OT has been ordered.  -pain medications using morphine and oxycodone  -might need rehab either CIR vs SNF  11-HLD: continue statins  12-CKD stage 3: stable and with Cr at baseline. -will monitor trend.  Code Status: full Family Communication: daughter at bedside Disposition Plan: inpatient, telemetry; LOS > 2 midnights   Time spent: 50 minutes  Barton Dubois [facility] Pager [phone]  **Disclaimer: This note may have been dictated with voice recognition software. Similar sounding words can inadvertently be transcribed and this note may contain  transcription errors which may not have been corrected upon publication of note.**

## 2014-02-07 NOTE — ED Provider Notes (Signed)
CSN: [id number]     Arrival date & time 02/07/14  0810 History   First MD Initiated Contact with Patient 02/07/14 [id number]     Chief Complaint  Patient presents with  . Fall     (Consider location/radiation/quality/duration/timing/severity/associated sxs/prior Treatment) HPI 80 y.o. Female complains of fall at home going to bathroom this a.m.  Patient states she is unclear if she slipped or passed out.  Denies head injury, complaining of pain to back.  No numbness, tingling, or weakness.  She was helped up by her son.  She presents via ems with full spinal immobilization.  She does not have any other complaints of pain.    Past Medical History  Diagnosis Date  . POSTHERPETIC NEURALGIA 11/02/2009  . DIABETES MELLITUS, TYPE II 09/06/2007  . B12 DEFICIENCY 05/23/2007  . HYPERLIPIDEMIA 05/23/2007  . HYPERKALEMIA 05/30/2010  . ANEMIA-IRON DEFICIENCY 01/26/2007  . DEPRESSIVE DISORDER 01/25/2008  . BLEPHARITIS, BILATERAL 02/23/2008  . Other specified forms of hearing loss 05/30/2010  . HYPERTENSION 01/26/2007  . Atrial fibrillation 05/23/2007  . DIASTOLIC HEART FAILURE, CHRONIC 12/14/2009  . POSTURAL HYPOTENSION 02/07/2008  . ALLERGIC RHINITIS 11/02/2009  . GERD 01/26/2007  . CONSTIPATION 01/25/2008  . RENAL INSUFFICIENCY 07/02/2009  . RENAL CYST, LEFT 09/06/2007  . SKIN LESION 05/30/2010  . BACK PAIN 08/02/2007  . OSTEOPOROSIS 05/23/2007  . SYNCOPE 02/07/2008  . FATIGUE 09/06/2007  . Dysuria 09/05/2008  . Abdominal pain, epigastric 09/06/2007  . EPIGASTRIC TENDERNESS 10/12/2007  . PULMONARY EMBOLISM, HX OF 05/23/2007  . SHINGLES, HX OF 05/23/2007  . Pulmonary embolism 10/14/2010  . 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 Left foot pain   . Pneumonia   . Status post dilation of esophageal narrowing   . Diabetic neuropathy    Past Surgical History  Procedure Laterality Date  . Appendectomy  1943  . Abdominal hysterectomy  1964  . Cardiac electrophysiology mapping and ablation     Family History  Problem Relation  Age of Onset  . Breast cancer Mother   . Colon cancer Father   . Stroke Father     died with stroke postop  . Diabetes Sister     2 sisters  . Hypertension Other    History  Substance Use Topics  . Smoking status: Never Smoker   . Smokeless tobacco: Never Used  . Alcohol Use: No   OB History   Grav Para Term Preterm Abortions TAB SAB Ect Mult Living                 Review of Systems  All other systems reviewed and are negative.     Allergies  Amiodarone; Deltasone; Diazepam; Diltiazem hcl; Levofloxacin; Pregabalin; Spironolactone; and Tequin  Home Medications   Prior to Admission medications   Medication Sig Start Date End Date Taking? Authorizing Provider  cloNIDine (CATAPRES - DOSED IN MG/24 HR) 0.3 mg/24hr patch Place 1 patch (0.3 mg total) onto the skin every 7 (seven) days. 12/15/13 12/26/14 Yes Biagio Borg, MD  Cyanocobalamin (VITAMIN B-12 IJ) Inject as directed every 30 (thirty) days.   Yes Historical Provider, MD  ferrous sulfate 325 (65 FE) MG tablet Take 325 mg by mouth 2 (two) times daily with a meal.    Yes Historical Provider, MD  fexofenadine (ALLEGRA) 180 MG tablet Take 180 mg by mouth every evening.    Yes Historical Provider, MD  fluticasone (FLONASE) 50 MCG/ACT nasal spray Place 2 sprays into the nose daily. 03/22/13  Yes Biagio Borg,  MD  furosemide (LASIX) 20 MG tablet Take every other day 11/03/12  Yes Biagio Borg, MD  Hypromellose (ARTIFICIAL TEARS OP) Place 1 drop into both eyes daily.   Yes Historical Provider, MD  insulin glargine (LANTUS) 100 UNIT/ML injection Inject 7 units subcutaneously once a day 06/03/11  Yes Biagio Borg, MD  labetalol (NORMODYNE) 200 MG tablet Take 400 mg by mouth 2 (two) times daily.   Yes Historical Provider, MD  metFORMIN (GLUCOPHAGE) 500 MG tablet Take 1 tablet (500 mg total) by mouth 2 (two) times daily with a meal. 1 po twice per day 07/20/13  Yes Biagio Borg, MD  pantoprazole (PROTONIX) 40 MG tablet Take 1 tablet (40 mg  total) by mouth daily. 07/20/13  Yes Biagio Borg, MD  polyethylene glycol [facility] / Floria Raveling) packet Take 17 g by mouth daily.   Yes Historical Provider, MD  pravastatin (PRAVACHOL) 40 MG tablet Take 0.5 tablets (20 mg total) by mouth daily. 1/2 daily 07/20/13  Yes Biagio Borg, MD  sulfamethoxazole-trimethoprim (BACTRIM DS,SEPTRA DS) 800-160 MG per tablet Take 1 tablet by mouth daily. 07/25/13  Yes Biagio Borg, MD  warfarin (COUMADIN) 2.5 MG tablet Take 2.5 mg by mouth every evening.   Yes Historical Provider, MD   BP 182/98  Pulse 101  Temp(Src) 96.7 F (35.9 C) (Rectal)  Resp 29  Ht 5\' 6"  (1.676 m)  Wt 130 lb (58.968 kg)  BMI 20.99 kg/m2  SpO2 96% Physical Exam  Nursing note and vitals reviewed. Constitutional: She is oriented to person, place, and time. She appears well-developed and well-nourished.  HENT:  Head: Normocephalic and atraumatic.  Right Ear: External ear normal.  Left Ear: External ear normal.  Nose: Nose normal.  Mouth/Throat: Oropharynx is clear and moist.  Eyes: Conjunctivae and EOM are normal. Pupils are equal, round, and reactive to light.  Neck: Normal range of motion.  Cardiovascular: Normal rate, regular rhythm and normal heart sounds.   Musculoskeletal: Normal range of motion. She exhibits no edema.  Diffuse ttp upper lumbar through thoracic spine, no visual signs of trauma.   Neurological: She is alert and oriented to person, place, and time. She has normal reflexes. She displays normal reflexes. No cranial nerve deficit. She exhibits normal muscle tone. Coordination normal.  Skin: Skin is warm and dry.  Psychiatric: She has a normal mood and affect. Her behavior is normal. Judgment and thought content normal.    ED Course  Procedures (including critical care time) Labs Review Labs Reviewed  URINALYSIS, ROUTINE W REFLEX MICROSCOPIC - Abnormal; Notable for the following:    Color, Urine STRAW (*)    Hgb urine dipstick TRACE (*)    All other  components within normal limits  CBC WITH DIFFERENTIAL - Abnormal; Notable for the following:    RDW 16.0 (*)    Platelets 146 (*)    Neutrophils Relative % 91 (*)    Lymphocytes Relative 7 (*)    Lymphs Abs 0.5 (*)    Monocytes Relative 2 (*)    All other components within normal limits  COMPREHENSIVE METABOLIC PANEL - Abnormal; Notable for the following:    Potassium 5.5 (*)    Glucose, Bld 161 (*)    BUN 38 (*)    Creatinine, Ser 1.38 (*)    GFR calc non Af Amer 33 (*)    GFR calc Af Amer 38 (*)    All other components within normal limits  PROTIME-INR - Abnormal; Notable for  the following:    Prothrombin Time 27.5 (*)    INR 2.67 (*)    All other components within normal limits  URINE MICROSCOPIC-ADD ON - Abnormal; Notable for the following:    Squamous Epithelial / LPF FEW (*)    All other components within normal limits  TROPONIN I    Imaging Review Dg Chest 1 View  02/07/2014   CLINICAL DATA:  Back and chest pain secondary to a fall.  EXAM: CHEST - 1 VIEW  COMPARISON:  10/04/2013  FINDINGS: Heart size and pulmonary vascularity are normal. There is slight chronic could peribronchial thickening. No infiltrates or effusions. No definitive acute osseous abnormality. There are compression deformities at multiple levels in the thoracic spine these appear unchanged since the prior study.  IMPRESSION: No acute abnormalities.   Electronically Signed   By: Rozetta Nunnery M.D.   On: 02/07/2014 09:17   Ct Head Wo Contrast  02/07/2014   CLINICAL DATA:  Neck and back pain secondary to a fall this morning.  EXAM: CT HEAD WITHOUT CONTRAST  CT CERVICAL SPINE WITHOUT CONTRAST  TECHNIQUE: Multidetector CT imaging of the head and cervical spine was performed following the standard protocol without intravenous contrast. Multiplanar CT image reconstructions of the cervical spine were also generated.  COMPARISON:  CT scan of the head dated 07/15/2008  FINDINGS: CT HEAD FINDINGS  No mass lesion. No  midline shift. No acute hemorrhage or hematoma. No extra-axial fluid collections. No evidence of acute infarction. There is mild diffuse cerebral cortical atrophy without ventricular dilatation. No osseous abnormality.  CT CERVICAL SPINE FINDINGS  There is no acute fracture or prevertebral soft tissue swelling. There is a 1.5 mm spondylolisthesis of C6 on C7 due to moderate left facet arthritis. There is narrowing of the C5-6 disc space. There is a small central disc protrusion at C3-4 with no neural impingement. There is multilevel moderately severe left facet arthritis from C3-4 through C6-7. The C2-3 level is fused.  IMPRESSION: 1. No acute intracranial abnormality. 2. No acute abnormality of the cervical spine.   Electronically Signed   By: Rozetta Nunnery M.D.   On: 02/07/2014 10:09   Ct Cervical Spine Wo Contrast  02/07/2014   CLINICAL DATA:  Neck and back pain secondary to a fall this morning.  EXAM: CT HEAD WITHOUT CONTRAST  CT CERVICAL SPINE WITHOUT CONTRAST  TECHNIQUE: Multidetector CT imaging of the head and cervical spine was performed following the standard protocol without intravenous contrast. Multiplanar CT image reconstructions of the cervical spine were also generated.  COMPARISON:  CT scan of the head dated 07/15/2008  FINDINGS: CT HEAD FINDINGS  No mass lesion. No midline shift. No acute hemorrhage or hematoma. No extra-axial fluid collections. No evidence of acute infarction. There is mild diffuse cerebral cortical atrophy without ventricular dilatation. No osseous abnormality.  CT CERVICAL SPINE FINDINGS  There is no acute fracture or prevertebral soft tissue swelling. There is a 1.5 mm spondylolisthesis of C6 on C7 due to moderate left facet arthritis. There is narrowing of the C5-6 disc space. There is a small central disc protrusion at C3-4 with no neural impingement. There is multilevel moderately severe left facet arthritis from C3-4 through C6-7. The C2-3 level is fused.  IMPRESSION: 1. No  acute intracranial abnormality. 2. No acute abnormality of the cervical spine.   Electronically Signed   By: Rozetta Nunnery M.D.   On: 02/07/2014 10:09   Ct Thoracic Spine Wo Contrast  02/07/2014   CLINICAL  DATA:  80 year old woman following fall. Back pain. Spinal tenderness at the neck and radiating through the lumbar spine.  EXAM: CT THORACIC AND LUMBAR SPINE WITHOUT CONTRAST  TECHNIQUE: Multidetector CT imaging of the thoracic and lumbar spine was performed without contrast. Multiplanar CT image reconstructions were also generated.  COMPARISON:  Cervical spine CT 02/07/2014.  Abdominal CT 01/28/2008.  FINDINGS: CT THORACIC SPINE FINDINGS  Cervical rib its are present bilaterally at C7. Numbering was performed contiguously from the craniocervical junction on prior cervical spine CT and the cervical ribs as references. In the lumbar spine, there are 5 non-rib-bearing lumbar type vertebral bodies with rudimentary ribs at T12. Sacralization of the transverse processes of L5. Levoconvex curve of the thoracolumbar spine with the apex at T12. Multilevel compression fractures are present. Paraspinal soft tissues demonstrate a 16 mm left thyroid lobe cystic lesion. Atherosclerosis is present including coronary artery disease. Lungs show extensive calcification of the tracheobronchial tree. Small hiatal hernia incidentally noted.  No gross hematomas identified in the central canal. There is no definite thoracic paravertebral phlegmon to suggest an acute compression fracture. Mild T2 superior endplate compression fracture. T3 a also shows a superior endplate compression fracture. T4 and T5 show preserved height and there appears to be ossification across the disc space and ankylosis. T6 shows a superior endplate compression fracture with 20% loss height and no retropulsion. T7 shows 25% loss of vertebral body height. T8 is preserved. T9 shows about 10% loss of vertebral body height without retropulsion. T10 is intact. T11  shows a superior endplate compression fracture with 25% loss of vertebral body height and mild retropulsion. T12 shows a biconcave compression fracture with 25% loss of vertebral body height but no retropulsion.  Osseous ridging is present throughout the thoracolumbar spine along with some calcified disc protrusions. No critical central stenosis is identified.  CT LUMBAR SPINE FINDINGS  Thoracic spinal alignment appears unchanged compared MRI 03/08/2012. Numbering convention used on the prior MRI is preserved. Sacral irregularity is present best seen on the sagittal images, which is a chronic finding seen on the MRI 03/18/2012.  Bilateral SI joint degenerative disease is present. Sclerotic lesion present in the left sacral ala appears new compared to CT 2009. This measures 8 mm and is indeterminate.  T12 biconcave compression fracture appears similar to prior MRI. L2 superior endplate compression fracture with retropulsion also appears unchanged. L3 shows preserved vertebral body height. L4 and L5 also show preserved vertebral body height without an acute fracture. Severe L4-L5 degenerative disc disease. Allowing for differences in technique, there is little if any interval change compared to prior MRI. Paraspinal soft tissues demonstrate dense aortoiliac and visceral atherosclerosis. Unchanged nodule in the left adrenal gland which may represent a large adenoma or myelolipoma.  IMPRESSION: CT THORACIC SPINE IMPRESSION  1. 16 mm left thyroid lobe cystic lesion. Consider further evaluation with thyroid ultrasound. If patient is clinically hyperthyroid, consider nuclear medicine thyroid uptake and scan. 2. Multilevel compression fractures detailed above. No definite paravertebral phlegmon to suggest an acute thoracic compression fracture. 3. Because of the multiplicity of thoracic compression fractures, MRI of the thoracic and lumbar spine should be considered to assess for bone marrow edema.  CT LUMBAR SPINE  IMPRESSION  1. Indeterminate 8 mm sclerotic lesion in the left sacral ala, new compared to 2009. 2. Old sacral fracture demonstrated on prior MRI. 3. Chronic L1 and L2 compression fractures with retropulsion of L2. No definite acute osseous abnormality in the lumbar spine compared to prior  MRI 03/18/2012. Moderate central stenosis associated with retropulsion of L2.   Electronically Signed   By: Dereck Ligas M.D.   On: 02/07/2014 10:47   Ct Lumbar Spine Wo Contrast  02/07/2014   CLINICAL DATA:  80 year old woman following fall. Back pain. Spinal tenderness at the neck and radiating through the lumbar spine.  EXAM: CT THORACIC AND LUMBAR SPINE WITHOUT CONTRAST  TECHNIQUE: Multidetector CT imaging of the thoracic and lumbar spine was performed without contrast. Multiplanar CT image reconstructions were also generated.  COMPARISON:  Cervical spine CT 02/07/2014.  Abdominal CT 01/28/2008.  FINDINGS: CT THORACIC SPINE FINDINGS  Cervical rib its are present bilaterally at C7. Numbering was performed contiguously from the craniocervical junction on prior cervical spine CT and the cervical ribs as references. In the lumbar spine, there are 5 non-rib-bearing lumbar type vertebral bodies with rudimentary ribs at T12. Sacralization of the transverse processes of L5. Levoconvex curve of the thoracolumbar spine with the apex at T12. Multilevel compression fractures are present. Paraspinal soft tissues demonstrate a 16 mm left thyroid lobe cystic lesion. Atherosclerosis is present including coronary artery disease. Lungs show extensive calcification of the tracheobronchial tree. Small hiatal hernia incidentally noted.  No gross hematomas identified in the central canal. There is no definite thoracic paravertebral phlegmon to suggest an acute compression fracture. Mild T2 superior endplate compression fracture. T3 a also shows a superior endplate compression fracture. T4 and T5 show preserved height and there appears to be  ossification across the disc space and ankylosis. T6 shows a superior endplate compression fracture with 20% loss height and no retropulsion. T7 shows 25% loss of vertebral body height. T8 is preserved. T9 shows about 10% loss of vertebral body height without retropulsion. T10 is intact. T11 shows a superior endplate compression fracture with 25% loss of vertebral body height and mild retropulsion. T12 shows a biconcave compression fracture with 25% loss of vertebral body height but no retropulsion.  Osseous ridging is present throughout the thoracolumbar spine along with some calcified disc protrusions. No critical central stenosis is identified.  CT LUMBAR SPINE FINDINGS  Thoracic spinal alignment appears unchanged compared MRI 03/08/2012. Numbering convention used on the prior MRI is preserved. Sacral irregularity is present best seen on the sagittal images, which is a chronic finding seen on the MRI 03/18/2012.  Bilateral SI joint degenerative disease is present. Sclerotic lesion present in the left sacral ala appears new compared to CT 2009. This measures 8 mm and is indeterminate.  T12 biconcave compression fracture appears similar to prior MRI. L2 superior endplate compression fracture with retropulsion also appears unchanged. L3 shows preserved vertebral body height. L4 and L5 also show preserved vertebral body height without an acute fracture. Severe L4-L5 degenerative disc disease. Allowing for differences in technique, there is little if any interval change compared to prior MRI. Paraspinal soft tissues demonstrate dense aortoiliac and visceral atherosclerosis. Unchanged nodule in the left adrenal gland which may represent a large adenoma or myelolipoma.  IMPRESSION: CT THORACIC SPINE IMPRESSION  1. 16 mm left thyroid lobe cystic lesion. Consider further evaluation with thyroid ultrasound. If patient is clinically hyperthyroid, consider nuclear medicine thyroid uptake and scan. 2. Multilevel compression  fractures detailed above. No definite paravertebral phlegmon to suggest an acute thoracic compression fracture. 3. Because of the multiplicity of thoracic compression fractures, MRI of the thoracic and lumbar spine should be considered to assess for bone marrow edema.  CT LUMBAR SPINE IMPRESSION  1. Indeterminate 8 mm sclerotic lesion in the  left sacral ala, new compared to 2009. 2. Old sacral fracture demonstrated on prior MRI. 3. Chronic L1 and L2 compression fractures with retropulsion of L2. No definite acute osseous abnormality in the lumbar spine compared to prior MRI 03/18/2012. Moderate central stenosis associated with retropulsion of L2.   Electronically Signed   By: Dereck Ligas M.D.   On: 02/07/2014 10:47     EKG Interpretation   Date/Time:  Tuesday February 07 2014 08:13:45 EDT Ventricular Rate:  103 PR Interval:  176 QRS Duration: 142 QT Interval:  371 QTC Calculation: 486 R Axis:   -84 Text Interpretation:  Sinus tachycardia Right bundle branch block Left  anterior fasicular block new from first prior ekg of 11/17/09 Confirmed by  Javona Bergevin MD, Andee Poles [phone]) on 02/07/2014 8:17:03 AM      MDM   Final diagnoses:  Fall  Thoracic compression fracture     This is a 80 year old female who either fell or had a syncopal episode this morning. She has multiple thoracic spinal compression fractures. She is on chronic anticoagulant therapy. She has not have any acute neurological deficit. Plan admission for a syncopal workup, pain management, and physical therapy.    Shaune Pollack, MD 02/07/14 1145

## 2014-02-07 NOTE — ED Notes (Signed)
Diabetic lunch tray ordered

## 2014-02-08 DIAGNOSIS — E119 Type 2 diabetes mellitus without complications: Secondary | ICD-10-CM

## 2014-02-08 DIAGNOSIS — K5900 Constipation, unspecified: Secondary | ICD-10-CM

## 2014-02-08 DIAGNOSIS — I5032 Chronic diastolic (congestive) heart failure: Secondary | ICD-10-CM

## 2014-02-08 DIAGNOSIS — S22009A Unspecified fracture of unspecified thoracic vertebra, initial encounter for closed fracture: Secondary | ICD-10-CM

## 2014-02-08 DIAGNOSIS — I059 Rheumatic mitral valve disease, unspecified: Secondary | ICD-10-CM

## 2014-02-08 DIAGNOSIS — K5909 Other constipation: Secondary | ICD-10-CM | POA: Diagnosis present

## 2014-02-08 DIAGNOSIS — I10 Essential (primary) hypertension: Secondary | ICD-10-CM

## 2014-02-08 LAB — GLUCOSE, CAPILLARY
Glucose-Capillary: 201 mg/dL — ABNORMAL HIGH (ref 70–99)
Glucose-Capillary: 240 mg/dL — ABNORMAL HIGH (ref 70–99)
Glucose-Capillary: 228 mg/dL — ABNORMAL HIGH (ref 70–99)
Glucose-Capillary: 249 mg/dL — ABNORMAL HIGH (ref 70–99)

## 2014-02-08 LAB — TROPONIN I: Troponin I: <0.3 ng/mL (ref <0.30)

## 2014-02-08 LAB — URINE CULTURE
Colony Count: NO GROWTH
Culture: NO GROWTH
Special Requests: NORMAL

## 2014-02-08 LAB — PROTIME-INR
INR: 3.63 — ABNORMAL HIGH (ref 0.00–1.49)
Prothrombin Time: 34.8 seconds — ABNORMAL HIGH (ref 11.6–15.2)

## 2014-02-08 LAB — CORTISOL: CORTISOL PLASMA: 20.3 ug/dL

## 2014-02-08 MED ORDER — POLYETHYLENE GLYCOL 3350 17 G PO PACK
17.0000 g | PACK | Freq: Two times a day (BID) | ORAL | Status: DC
  Administered 2014-02-08 – 2014-02-09 (×3): 17 g via ORAL
  Filled 2014-02-08 (×4): qty 1

## 2014-02-08 MED ORDER — BISACODYL 10 MG RE SUPP
10.0000 mg | Freq: Every day | RECTAL | Status: DC | PRN
  Administered 2014-02-09: 10 mg via RECTAL
  Filled 2014-02-08: qty 1

## 2014-02-08 MED ORDER — METFORMIN HCL 500 MG PO TABS
500.0000 mg | ORAL_TABLET | Freq: Two times a day (BID) | ORAL | Status: DC
Start: 2014-02-08 — End: 2014-02-08

## 2014-02-08 MED ORDER — FLEET ENEMA 7-19 GM/118ML RE ENEM
1.0000 | ENEMA | Freq: Every day | RECTAL | Status: DC | PRN
  Filled 2014-02-08: qty 1

## 2014-02-08 MED ORDER — SENNA 8.6 MG PO TABS
2.0000 | ORAL_TABLET | Freq: Every day | ORAL | Status: DC
  Administered 2014-02-08 – 2014-02-09 (×2): 17.2 mg via ORAL
  Filled 2014-02-08 (×2): qty 2

## 2014-02-08 MED ORDER — ACETAMINOPHEN 325 MG PO TABS
650.0000 mg | ORAL_TABLET | Freq: Three times a day (TID) | ORAL | Status: DC
  Administered 2014-02-08 – 2014-02-09 (×5): 650 mg via ORAL
  Filled 2014-02-08 (×5): qty 2

## 2014-02-08 NOTE — Progress Notes (Signed)
Utilization Review Completed.Donne Anon T6/06/2014

## 2014-02-08 NOTE — Discharge Instructions (Signed)
Information on my medicine - Coumadin®   (Warfarin) ° °This medication education was reviewed with me or my healthcare representative as part of my discharge preparation.  The pharmacist that spoke with me during my hospital stay was:  Kemoni Quesenberry P, RPH ° °Why was Coumadin prescribed for you? °Coumadin was prescribed for you because you have a blood clot or a medical condition that can cause an increased risk of forming blood clots. Blood clots can cause serious health problems by blocking the flow of blood to the heart, lung, or brain. Coumadin can prevent harmful blood clots from forming. °As a reminder your indication for Coumadin is:   Stroke Prevention Because Of Atrial Fibrillation ° °What test will check on my response to Coumadin? °While on Coumadin (warfarin) you will need to have an INR test regularly to ensure that your dose is keeping you in the desired range. The INR (international normalized ratio) number is calculated from the result of the laboratory test called prothrombin time (PT). ° °If an INR APPOINTMENT HAS NOT ALREADY BEEN MADE FOR YOU please schedule an appointment to have this lab work done by your health care provider within 7 days. °Your INR goal is usually a number between:  2 to 3 or your provider may give you a more narrow range like 2-2.5.  Ask your health care provider during an office visit what your goal INR is. ° °What  do you need to  know  About  COUMADIN? °Take Coumadin (warfarin) exactly as prescribed by your healthcare provider about the same time each day.  DO NOT stop taking without talking to the doctor who prescribed the medication.  Stopping without other blood clot prevention medication to take the place of Coumadin may increase your risk of developing a new clot or stroke.  Get refills before you run out. ° °What do you do if you miss a dose? °If you miss a dose, take it as soon as you remember on the same day then continue your regularly scheduled regimen the next day.   Do not take two doses of Coumadin at the same time. ° °Important Safety Information °A possible side effect of Coumadin (Warfarin) is an increased risk of bleeding. You should call your healthcare provider right away if you experience any of the following: °  Bleeding from an injury or your nose that does not stop. °  Unusual colored urine (red or dark [place]) or unusual colored stools (red or black). °  Unusual bruising for unknown reasons. °  A serious fall or if you hit your head (even if there is no bleeding). ° °Some foods or medicines interact with Coumadin® (warfarin) and might alter your response to warfarin. To help avoid this: °  Eat a balanced diet, maintaining a consistent amount of Vitamin K. °  Notify your provider about major diet changes you plan to make. °  Avoid alcohol or limit your intake to 1 drink for women and 2 drinks for men per day. °(1 drink is 5 oz. wine, 12 oz. beer, or 1.5 oz. liquor.) ° °Make sure that ANY health care provider who prescribes medication for you knows that you are taking Coumadin (warfarin).  Also make sure the healthcare provider who is monitoring your Coumadin knows when you have started a new medication including herbals and non-prescription products. ° °Coumadin® (Warfarin)  Major Drug Interactions  °Increased Warfarin Effect Decreased Warfarin Effect  °Alcohol (large quantities) °Antibiotics (esp. Septra/Bactrim, Flagyl, Cipro) °Amiodarone (Cordarone) °Aspirin (  ASA) °Cimetidine (Tagamet) °Megestrol (Megace) °NSAIDs (ibuprofen, naproxen, etc.) °Piroxicam (Feldene) °Propafenone (Rythmol SR) °Propranolol (Inderal) °Isoniazid (INH) °Posaconazole (Noxafil) Barbiturates (Phenobarbital) °Carbamazepine (Tegretol) °Chlordiazepoxide (Librium) °Cholestyramine (Questran) °Griseofulvin °Oral Contraceptives °Rifampin °Sucralfate (Carafate) °Vitamin K  ° °Coumadin® (Warfarin) Major Herbal Interactions  °Increased Warfarin Effect Decreased Warfarin Effect  °Garlic °Ginseng °Ginkgo  biloba Coenzyme Q10 °Green tea °St. John’s wort   ° °Coumadin® (Warfarin) FOOD Interactions  °Eat a consistent number of servings per week of foods HIGH in Vitamin K °(1 serving = ½ cup)  °Collards (cooked, or boiled & drained) °Kale (cooked, or boiled & drained) °Mustard greens (cooked, or boiled & drained) °Parsley *serving size only = ¼ cup °Spinach (cooked, or boiled & drained) °Swiss chard (cooked, or boiled & drained) °Turnip greens (cooked, or boiled & drained)  °Eat a consistent number of servings per week of foods MEDIUM-HIGH in Vitamin K °(1 serving = 1 cup)  °Asparagus (cooked, or boiled & drained) °Broccoli (cooked, boiled & drained, or raw & chopped) °Brussel sprouts (cooked, or boiled & drained) *serving size only = ½ cup °Lettuce, raw (green leaf, endive, romaine) °Spinach, raw °Turnip greens, raw & chopped  ° °These websites have more information on Coumadin (warfarin):  www.coumadin.com; °www.ahrq.gov/consumer/coumadin.htm; ° ° °

## 2014-02-08 NOTE — Progress Notes (Signed)
Echocardiogram 2D Echocardiogram has been performed.  Lindsay Sheppard 02/08/2014, 11:16 AM

## 2014-02-08 NOTE — Progress Notes (Signed)
Clinical Social Work Department BRIEF PSYCHOSOCIAL ASSESSMENT 02/08/2014  Patient:  Lindsay Sheppard, Lindsay Sheppard     Account Number:  [id number]     [place] date:  02/07/2014  Clinical Social Worker:  Megan Salon  Date/Time:  02/08/2014 02:03 PM  Referred by:  Physician  Date Referred:  02/08/2014 Referred for  SNF Placement   Other Referral:   Interview type:  Other - See comment Other interview type:   CSW spoke to patient and patient's daughter by bedside    PSYCHOSOCIAL DATA Living Status:  FAMILY Admitted from facility:   Level of care:   Primary support name:  Luanna Salk Primary support relationship to patient:  CHILD, ADULT Degree of support available:   Good    CURRENT CONCERNS Current Concerns  Post-Acute Placement   Other Concerns:    SOCIAL WORK ASSESSMENT / PLAN Clinical Social Worker received referral for SNF placement at d/c. CSW introduced self and explained reason for visit. Patient had visitors by bedside, patient's daughter and granddaughter. CSW explained SNF process to patient and patient's daughter. Patient reported she is agreeable for SNF placement if insurance will cover SNF. Daughter states she would like patient to go to CIR and then the possibility of SNF. . CSW will complete FL2 for MD's signature and will update patient and family when bed offers are received.   Assessment/plan status:  Psychosocial Support/Ongoing Assessment of Needs Other assessment/ plan:   Information/referral to community resources:   SNF information/CSW card    PATIENT'S/FAMILY'S RESPONSE TO PLAN OF CARE: Patient's daughter reports that they are interested in rehab if patient has a 3 midnight qualifying stay. Patient's daughter interested in [facility], [facility] or [facility].        Jeanette Caprice, MSW, [place]

## 2014-02-08 NOTE — Progress Notes (Signed)
Rehab Admissions Coordinator Note:  Patient was screened by Artelia Game L for appropriateness for an Inpatient Acute Rehab Consult.  At this time, we are recommending Inpatient Rehab consult.  Melonee Gerstel, PT 02/08/2014, 2:11 PM  I can be reached at [phone].

## 2014-02-08 NOTE — Progress Notes (Signed)
Clinical Social Work Department CLINICAL SOCIAL WORK PLACEMENT NOTE 02/08/2014  Patient:  Lindsay Sheppard, Lindsay Sheppard  Account Number:  [id number] [place] date:  02/07/2014  Clinical Social Worker:  Megan Salon  Date/time:  02/08/2014 02:06 PM  Clinical Social Work is seeking post-discharge placement for this patient at the following level of care:   [place]   (*CSW will update this form in Epic as items are completed)   02/08/2014  Patient/family provided with [facility] Department of Clinical Social Work's list of facilities offering this level of care within the geographic area requested by the patient (or if unable, by the patient's family).  02/08/2014  Patient/family informed of their freedom to choose among providers that offer the needed level of care, that participate in Medicare, Medicaid or managed care program needed by the patient, have an available bed and are willing to accept the patient.  02/08/2014  Patient/family informed of [facility]' ownership interest in [facility], as well as of the fact that they are under no obligation to receive care at this facility.  PASARR submitted to [facility] on 02/08/2014 PASARR number received on 02/08/2014  FL2 transmitted to all facilities in geographic area requested by pt/family on  02/08/2014 FL2 transmitted to all facilities within larger geographic area on   Patient informed that his/her managed care company has contracts with or will negotiate with  certain facilities, including the following:     Patient/family informed of bed offers received:   Patient chooses bed at  Physician recommends and patient chooses bed at    Patient to be transferred to  on   Patient to be transferred to facility by  Patient and family notified of transfer on  Name of family member notified:    The following physician request were entered in Epic:   Additional Comments:  Jeanette Caprice, MSW, [place]

## 2014-02-08 NOTE — Progress Notes (Signed)
Chart reviewed.   [facility] PROGRESS NOTE  Lindsay Sheppard HGD:[id number] DOB: Dec 21, 1920 DOA: 02/07/2014 PCP: [name], MD  Assessment/Plan:  Principal Problem:   Fall: no LOC reported.  Active Problems:   Compression fracture:  suspect multiple old fractures. Patient has tenderness in the midthoracic spine only.  continue physical therapy, occupational therapy, CIR versus skilled nursing facility. Patient and family are agreeable. Patient also has 24-hour supervision at home by family members. Constipation: laxatives   DIABETES MELLITUS, TYPE II   B12 DEFICIENCY   HYPERTENSION   Atrial fibrillation: coumadin supratherapeutic. hold   DIASTOLIC HEART FAILURE, CHRONIC   POSTURAL HYPOTENSION   GERD   Hyperkalemia: repeat hypertension    Code Status:  full Family Communication:  Multiple at and bedside Disposition Plan:  home  HPI/Subjective: Complains of constipation. Fell yesterday while getting off the commode at home. No loss of consciousness. Complains of mid back pain with radiation to the left side.  Objective: Filed Vitals:   02/08/14 0514  BP: 179/90  Pulse: 104  Temp: 98.5 F (36.9 C)  Resp: 20    Intake/Output Summary (Last 24 hours) at 02/08/14 0910 Last data filed at 02/07/14 2215  Gross per 24 hour  Intake   1000 ml  Output    100 ml  Net    900 ml   Filed Weights   02/07/14 0813 02/07/14 1546 02/08/14 0514  Weight: 58.968 kg (130 lb) 55.9 kg (123 lb 3.8 oz) 54.477 kg (120 lb 1.6 oz)    Exam:   General:  Uncomfortable appearing.  Cardiovascular: regular rate rhythm without murmurs gallops rubs  Respiratory: clear to auscultation bilaterally without wheezes rhonchi or rales  Abdomen: soft nontender nondistended  Ext: no clubbing cyanosis or edema  Basic Metabolic Panel:  Recent Labs Lab 02/07/14 0825 02/07/14 1706  NA 137  --   K 5.5*  --   CL 102  --   CO2 21  --   GLUCOSE 161*  --   BUN 38*  --   CREATININE 1.38*  --    CALCIUM 10.3  --   MG  --  1.7  PHOS  --  3.0   Liver Function Tests:  Recent Labs Lab 02/07/14 0825  AST 22  ALT 18  ALKPHOS 66  BILITOT 0.3  PROT 6.7  ALBUMIN 3.9   No results found for this basename: LIPASE, AMYLASE,  in the last 168 hours No results found for this basename: AMMONIA,  in the last 168 hours CBC:  Recent Labs Lab 02/07/14 0825  WBC 6.9  NEUTROABS 6.3  HGB 12.8  HCT 40.5  MCV 89.8  PLT 146*   Cardiac Enzymes:  Recent Labs Lab 02/07/14 0825 02/07/14 1706 02/07/14 2244 02/08/14 0420  TROPONINI <0.30 <0.30 <0.30 <0.30   BNP (last 3 results) No results found for this basename: PROBNP,  in the last 8760 hours CBG:  Recent Labs Lab 02/07/14 1657 02/07/14 2150 02/08/14 0659  GLUCAP 134* 209* 201*    No results found for this or any previous visit (from the past 240 hour(s)).   Studies: Dg Chest 1 View  02/07/2014   CLINICAL DATA:  Back and chest pain secondary to a fall.  EXAM: CHEST - 1 VIEW  COMPARISON:  10/04/2013  FINDINGS: Heart size and pulmonary vascularity are normal. There is slight chronic could peribronchial thickening. No infiltrates or effusions. No definitive acute osseous abnormality. There are compression deformities at multiple levels in the thoracic spine  these appear unchanged since the prior study.  IMPRESSION: No acute abnormalities.   Electronically Signed   By: Rozetta Nunnery M.D.   On: 02/07/2014 09:17   Ct Head Wo Contrast  02/07/2014   CLINICAL DATA:  Neck and back pain secondary to a fall this morning.  EXAM: CT HEAD WITHOUT CONTRAST  CT CERVICAL SPINE WITHOUT CONTRAST  TECHNIQUE: Multidetector CT imaging of the head and cervical spine was performed following the standard protocol without intravenous contrast. Multiplanar CT image reconstructions of the cervical spine were also generated.  COMPARISON:  CT scan of the head dated 07/15/2008  FINDINGS: CT HEAD FINDINGS  No mass lesion. No midline shift. No acute hemorrhage or  hematoma. No extra-axial fluid collections. No evidence of acute infarction. There is mild diffuse cerebral cortical atrophy without ventricular dilatation. No osseous abnormality.  CT CERVICAL SPINE FINDINGS  There is no acute fracture or prevertebral soft tissue swelling. There is a 1.5 mm spondylolisthesis of C6 on C7 due to moderate left facet arthritis. There is narrowing of the C5-6 disc space. There is a small central disc protrusion at C3-4 with no neural impingement. There is multilevel moderately severe left facet arthritis from C3-4 through C6-7. The C2-3 level is fused.  IMPRESSION: 1. No acute intracranial abnormality. 2. No acute abnormality of the cervical spine.   Electronically Signed   By: Rozetta Nunnery M.D.   On: 02/07/2014 10:09   Ct Cervical Spine Wo Contrast  02/07/2014   CLINICAL DATA:  Neck and back pain secondary to a fall this morning.  EXAM: CT HEAD WITHOUT CONTRAST  CT CERVICAL SPINE WITHOUT CONTRAST  TECHNIQUE: Multidetector CT imaging of the head and cervical spine was performed following the standard protocol without intravenous contrast. Multiplanar CT image reconstructions of the cervical spine were also generated.  COMPARISON:  CT scan of the head dated 07/15/2008  FINDINGS: CT HEAD FINDINGS  No mass lesion. No midline shift. No acute hemorrhage or hematoma. No extra-axial fluid collections. No evidence of acute infarction. There is mild diffuse cerebral cortical atrophy without ventricular dilatation. No osseous abnormality.  CT CERVICAL SPINE FINDINGS  There is no acute fracture or prevertebral soft tissue swelling. There is a 1.5 mm spondylolisthesis of C6 on C7 due to moderate left facet arthritis. There is narrowing of the C5-6 disc space. There is a small central disc protrusion at C3-4 with no neural impingement. There is multilevel moderately severe left facet arthritis from C3-4 through C6-7. The C2-3 level is fused.  IMPRESSION: 1. No acute intracranial abnormality. 2. No  acute abnormality of the cervical spine.   Electronically Signed   By: Rozetta Nunnery M.D.   On: 02/07/2014 10:09   Ct Thoracic Spine Wo Contrast  02/07/2014   CLINICAL DATA:  80 year old woman following fall. Back pain. Spinal tenderness at the neck and radiating through the lumbar spine.  EXAM: CT THORACIC AND LUMBAR SPINE WITHOUT CONTRAST  TECHNIQUE: Multidetector CT imaging of the thoracic and lumbar spine was performed without contrast. Multiplanar CT image reconstructions were also generated.  COMPARISON:  Cervical spine CT 02/07/2014.  Abdominal CT 01/28/2008.  FINDINGS: CT THORACIC SPINE FINDINGS  Cervical rib its are present bilaterally at C7. Numbering was performed contiguously from the craniocervical junction on prior cervical spine CT and the cervical ribs as references. In the lumbar spine, there are 5 non-rib-bearing lumbar type vertebral bodies with rudimentary ribs at T12. Sacralization of the transverse processes of L5. Levoconvex curve of the thoracolumbar spine  with the apex at T12. Multilevel compression fractures are present. Paraspinal soft tissues demonstrate a 16 mm left thyroid lobe cystic lesion. Atherosclerosis is present including coronary artery disease. Lungs show extensive calcification of the tracheobronchial tree. Small hiatal hernia incidentally noted.  No gross hematomas identified in the central canal. There is no definite thoracic paravertebral phlegmon to suggest an acute compression fracture. Mild T2 superior endplate compression fracture. T3 a also shows a superior endplate compression fracture. T4 and T5 show preserved height and there appears to be ossification across the disc space and ankylosis. T6 shows a superior endplate compression fracture with 20% loss height and no retropulsion. T7 shows 25% loss of vertebral body height. T8 is preserved. T9 shows about 10% loss of vertebral body height without retropulsion. T10 is intact. T11 shows a superior endplate compression  fracture with 25% loss of vertebral body height and mild retropulsion. T12 shows a biconcave compression fracture with 25% loss of vertebral body height but no retropulsion.  Osseous ridging is present throughout the thoracolumbar spine along with some calcified disc protrusions. No critical central stenosis is identified.  CT LUMBAR SPINE FINDINGS  Thoracic spinal alignment appears unchanged compared MRI 03/08/2012. Numbering convention used on the prior MRI is preserved. Sacral irregularity is present best seen on the sagittal images, which is a chronic finding seen on the MRI 03/18/2012.  Bilateral SI joint degenerative disease is present. Sclerotic lesion present in the left sacral ala appears new compared to CT 2009. This measures 8 mm and is indeterminate.  T12 biconcave compression fracture appears similar to prior MRI. L2 superior endplate compression fracture with retropulsion also appears unchanged. L3 shows preserved vertebral body height. L4 and L5 also show preserved vertebral body height without an acute fracture. Severe L4-L5 degenerative disc disease. Allowing for differences in technique, there is little if any interval change compared to prior MRI. Paraspinal soft tissues demonstrate dense aortoiliac and visceral atherosclerosis. Unchanged nodule in the left adrenal gland which may represent a large adenoma or myelolipoma.  IMPRESSION: CT THORACIC SPINE IMPRESSION  1. 16 mm left thyroid lobe cystic lesion. Consider further evaluation with thyroid ultrasound. If patient is clinically hyperthyroid, consider nuclear medicine thyroid uptake and scan. 2. Multilevel compression fractures detailed above. No definite paravertebral phlegmon to suggest an acute thoracic compression fracture. 3. Because of the multiplicity of thoracic compression fractures, MRI of the thoracic and lumbar spine should be considered to assess for bone marrow edema.  CT LUMBAR SPINE IMPRESSION  1. Indeterminate 8 mm sclerotic  lesion in the left sacral ala, new compared to 2009. 2. Old sacral fracture demonstrated on prior MRI. 3. Chronic L1 and L2 compression fractures with retropulsion of L2. No definite acute osseous abnormality in the lumbar spine compared to prior MRI 03/18/2012. Moderate central stenosis associated with retropulsion of L2.   Electronically Signed   By: Dereck Ligas M.D.   On: 02/07/2014 10:47   Ct Lumbar Spine Wo Contrast  02/07/2014   CLINICAL DATA:  80 year old woman following fall. Back pain. Spinal tenderness at the neck and radiating through the lumbar spine.  EXAM: CT THORACIC AND LUMBAR SPINE WITHOUT CONTRAST  TECHNIQUE: Multidetector CT imaging of the thoracic and lumbar spine was performed without contrast. Multiplanar CT image reconstructions were also generated.  COMPARISON:  Cervical spine CT 02/07/2014.  Abdominal CT 01/28/2008.  FINDINGS: CT THORACIC SPINE FINDINGS  Cervical rib its are present bilaterally at C7. Numbering was performed contiguously from the craniocervical junction on prior cervical  spine CT and the cervical ribs as references. In the lumbar spine, there are 5 non-rib-bearing lumbar type vertebral bodies with rudimentary ribs at T12. Sacralization of the transverse processes of L5. Levoconvex curve of the thoracolumbar spine with the apex at T12. Multilevel compression fractures are present. Paraspinal soft tissues demonstrate a 16 mm left thyroid lobe cystic lesion. Atherosclerosis is present including coronary artery disease. Lungs show extensive calcification of the tracheobronchial tree. Small hiatal hernia incidentally noted.  No gross hematomas identified in the central canal. There is no definite thoracic paravertebral phlegmon to suggest an acute compression fracture. Mild T2 superior endplate compression fracture. T3 a also shows a superior endplate compression fracture. T4 and T5 show preserved height and there appears to be ossification across the disc space and  ankylosis. T6 shows a superior endplate compression fracture with 20% loss height and no retropulsion. T7 shows 25% loss of vertebral body height. T8 is preserved. T9 shows about 10% loss of vertebral body height without retropulsion. T10 is intact. T11 shows a superior endplate compression fracture with 25% loss of vertebral body height and mild retropulsion. T12 shows a biconcave compression fracture with 25% loss of vertebral body height but no retropulsion.  Osseous ridging is present throughout the thoracolumbar spine along with some calcified disc protrusions. No critical central stenosis is identified.  CT LUMBAR SPINE FINDINGS  Thoracic spinal alignment appears unchanged compared MRI 03/08/2012. Numbering convention used on the prior MRI is preserved. Sacral irregularity is present best seen on the sagittal images, which is a chronic finding seen on the MRI 03/18/2012.  Bilateral SI joint degenerative disease is present. Sclerotic lesion present in the left sacral ala appears new compared to CT 2009. This measures 8 mm and is indeterminate.  T12 biconcave compression fracture appears similar to prior MRI. L2 superior endplate compression fracture with retropulsion also appears unchanged. L3 shows preserved vertebral body height. L4 and L5 also show preserved vertebral body height without an acute fracture. Severe L4-L5 degenerative disc disease. Allowing for differences in technique, there is little if any interval change compared to prior MRI. Paraspinal soft tissues demonstrate dense aortoiliac and visceral atherosclerosis. Unchanged nodule in the left adrenal gland which may represent a large adenoma or myelolipoma.  IMPRESSION: CT THORACIC SPINE IMPRESSION  1. 16 mm left thyroid lobe cystic lesion. Consider further evaluation with thyroid ultrasound. If patient is clinically hyperthyroid, consider nuclear medicine thyroid uptake and scan. 2. Multilevel compression fractures detailed above. No definite  paravertebral phlegmon to suggest an acute thoracic compression fracture. 3. Because of the multiplicity of thoracic compression fractures, MRI of the thoracic and lumbar spine should be considered to assess for bone marrow edema.  CT LUMBAR SPINE IMPRESSION  1. Indeterminate 8 mm sclerotic lesion in the left sacral ala, new compared to 2009. 2. Old sacral fracture demonstrated on prior MRI. 3. Chronic L1 and L2 compression fractures with retropulsion of L2. No definite acute osseous abnormality in the lumbar spine compared to prior MRI 03/18/2012. Moderate central stenosis associated with retropulsion of L2.   Electronically Signed   By: Dereck Ligas M.D.   On: 02/07/2014 10:47    Scheduled Meds: . cloNIDine  0.3 mg Transdermal Q7 days  . ferrous sulfate  325 mg Oral BID WC  . fluticasone  2 spray Each Nare Daily  . insulin aspart  0-9 Units Subcutaneous TID WC  . insulin glargine  7 Units Subcutaneous Daily  . labetalol  400 mg Oral BID  .  loratadine  10 mg Oral Daily  . pantoprazole  40 mg Oral Daily  . polyethylene glycol  17 g Oral Daily  . simvastatin  10 mg Oral q1800  . sodium chloride  3 mL Intravenous Q12H  . sodium chloride  3 mL Intravenous Q12H  . sulfamethoxazole-trimethoprim  1 tablet Oral Daily  . warfarin  2.5 mg Oral ONCE-1800  . Warfarin - Pharmacist Dosing Inpatient   Does not apply q1800   Continuous Infusions:   Time spent: 35 minutes  [facility] Pager [phone]. If 7PM-7AM, please contact night-coverage at [email or web address], password [facility] 02/08/2014, 9:10 AM  LOS: 1 day

## 2014-02-08 NOTE — Progress Notes (Signed)
OT EVALUATION  PTA, pt was mod I with ADL and mobility @ rollator level. Pt very independent 80 yo who is motivated to return to PLOF. Feel pt will benefit from short CIR stay to increase mobility and independence with ADL and D/C home with supportive family who can provide 24/7 S.   02/07/14 1800  OT Visit Information  Last OT Received On 02/07/14  Assistance Needed +2  History of Present Illness 80 y.o. female with PMH of HTN, diabetes, HLD, B12 deficiency, CKD stage 3; recurrent UTI (on suppression therapy with bactrim); atrial fibrillation ( on coumadin), GERD and anemia; came to ED after experiencing a falling episode at home. Patient reports severe back pain. Patient is unsure if she pass out or just slipped. Had hx in the past of orthostatic hypotension and syncope. In ED she was found to have mild hyperkalemia and severe back pain; CT thorax demonstrated 7 compression fractures  Precautions  Precautions [place] expects to be discharged to: Inpatient rehab  Living Arrangements Children  Additional Comments Lives with son. [place]  Prior Function  Level of Independence Independent with assistive device(s)  Comments Used RW  Communication  Communication HOH  Cognition  Arousal/Alertness Awake/alert  Behavior During Therapy WFL for tasks assessed/performed  Overall Cognitive Status Within Functional Limits for tasks assessed  Memory Decreased short-term memory  Upper Extremity Assessment  Upper Extremity Assessment Generalized weakness  Lower Extremity Assessment  Lower Extremity Assessment Defer to PT evaluation  Cervical / Trunk Assessment  Cervical / Trunk Assessment Other exceptions (guarding due to pain)  ADL  Overall ADL's  Needs assistance/impaired  Eating/Feeding Set up  Grooming Minimal assistance  Upper Body Bathing Moderate assistance;Sitting  Lower Body Bathing Maximal assistance;Sit to/from  stand  Upper Body Dressing  Moderate assistance;Sitting  Lower Body Dressing Maximal assistance;Sit to/from Retail buyer (unable to transfer due to pain)  Functional mobility during ADLs +2 for physical assistance (Need 2 people. Only able to complete sit - stand with mod A )  General ADL Comments ADL primarily limited by pain  Vision- History  Baseline Vision/History No visual deficits  Bed Mobility  Overal bed mobility Needs Assistance  Bed Mobility Rolling;Sidelying to Sit  Rolling Mod assist  Sidelying to sit Mod assist  Transfers  Overall transfer level Needs assistance  Transfers Sit to/from Stand  Sit to Stand Mod assist  General transfer comment unable to step today  Balance  Overall balance assessment Needs assistance  Sitting-balance support Feet supported  Sitting balance-Leahy Scale Fair  Standing balance support Bilateral upper extremity supported  Standing balance-Leahy Scale Poor  OT - End of Session  Equipment Utilized During Treatment Gait belt  Activity Tolerance Patient limited by pain  Patient left in bed;with call bell/phone within reach;with bed alarm set;with family/visitor present  Nurse Communication Mobility status  OT Assessment  OT Recommendation/Assessment Patient needs continued OT Services  OT Problem List Decreased strength;Decreased activity tolerance;Impaired balance (sitting and/or standing);Decreased knowledge of use of DME or AE;Decreased knowledge of precautions;Pain  OT Therapy Diagnosis  Generalized weakness;Acute pain  OT Plan  OT Frequency Min 2X/week  OT Treatment/Interventions Self-care/ADL training;Therapeutic exercise;Energy conservation;DME and/or AE instruction;Therapeutic activities;Patient/family education;Balance training  OT Recommendation  Recommendations for Other Services Rehab consult  Follow Up Recommendations CIR;Supervision/Assistance - 24 hour  OT Equipment None recommended by OT  Individuals Consulted   Consulted and Agree with Results and Recommendations Patient;Family member/caregiver  Family Member Consulted daughter  Acute Rehab OT Goals  Patient Stated Goal to bet back to doing for myself  OT Goal Formulation With patient  Time For Goal Achievement 02/22/14  Potential to Achieve Goals Good  OT Time Calculation  OT Start Time 0962  OT Stop Time 1809  OT Time Calculation (min) 30 min  OT General Charges  $OT Visit   OT Evaluation  $Initial OT Evaluation Tier I   OT Treatments  $Self Care/Home Management    Written Expression  Dominant Hand Right  Maurie Boettcher, OTR/L  [phone] 02/07/2014

## 2014-02-08 NOTE — Progress Notes (Signed)
Chaplain responded to spiritual care consult. Pt was sleeping and her daughter, [name], did not want her awakened. Chaplain offered emotional support to [place], who said she has an ill husband at home and expressed feeling "exhausted but well supported by church and family." She appreciated chaplain's visit. Please page for support.   Ethelene Browns [phone]

## 2014-02-08 NOTE — Evaluation (Addendum)
Physical Therapy Evaluation Patient Details Name: Lindsay Sheppard MRN: [id number] DOB: 26-Apr-1921 Today's Date: 02/08/2014   History of Present Illness   80 y.o. female with PMH of HTN, diabetes, HLD, B12 deficiency, CKD stage 3; recurrent UTI (on suppression therapy with bactrim); atrial fibrillation ( on coumadin), GERD and anemia; came to ED after experiencing a falling episode at home. Patient reports severe back pain. Patient is unsure if she pass out or just slipped. Had hx in the past of orthostatic hypotension and syncope. In ED she was found to have mild hyperkalemia and severe back pain; CT thorax demonstrated 7 compression fractures  Clinical Impression  Pt admitted with above. Pt currently with functional limitations due to the deficits listed below (see PT Problem List).  Pt will benefit from skilled PT to increase their independence and safety with mobility to allow discharge to the venue listed below.     Follow Up Recommendations Rehab;Supervision/Assistance - 24 hour (for vestibular rehab)    Equipment Recommendations  None recommended by PT    Recommendations for Other Services   Rehab consult    Precautions / Restrictions Precautions Precautions: Fall Precaution Comments: watch BP Restrictions Weight Bearing Restrictions: No      Mobility  Bed Mobility Overal bed mobility: Needs Assistance Bed Mobility: Sit to Supine Rolling: Min assist     Sit to supine: Min assist   General bed mobility comments: Needed assist for LES  Transfers Overall transfer level: Needs assistance Equipment used: Rolling walker (2 wheeled) Transfers: Sit to/from Stand Sit to Stand: Mod assist;+2 physical assistance Stand pivot transfers: Mod assist;+2 physical assistance       General transfer comment: Needed steadying assist initially.  Pt also needs a minute for BP issues.    Ambulation/Gait Ambulation/Gait assistance: Min assist;+2 physical assistance Ambulation Distance  (Feet): 15 Feet Assistive device: Rolling walker (2 wheeled) Gait Pattern/deviations: Step-to pattern;Decreased stride length;Antalgic   Gait velocity interpretation: Below normal speed for age/gender General Gait Details: Needed assist to steer RW as well as guidance as to path.  Pt needs encouragement to continue.  Standing fully upright with RW but did c/o dizzines that was slight.  Pt's left LE was externally rotated.  Family states it has gotten progressively worse for some time now.    Stairs            Wheelchair Mobility    Modified Rankin (Stroke Patients Only)       Balance Overall balance assessment: Needs assistance;History of Falls Sitting-balance support: No upper extremity supported;Feet supported Sitting balance-Leahy Scale: Fair     Standing balance support: Bilateral upper extremity supported;During functional activity Standing balance-Leahy Scale: Poor Standing balance comment: Needs steadying assist with RW.  Unsteady on feet without UE support.                              Pertinent Vitals/Pain BP 115/54 prior to ambulation.  138/78 after ambulation.  No pain    Home Living Family/patient expects to be discharged to:: Inpatient rehab Living Arrangements: Children               Additional Comments: Lives with son. Other [place]    Prior Function Level of Independence: Independent with assistive device(s)         Comments: Used RW     Hand Dominance   Dominant Hand: Right    Extremity/Trunk Assessment  Upper Extremity Assessment: Defer to OT evaluation           Lower Extremity Assessment: Generalized weakness      Cervical / Trunk Assessment: Other exceptions (guarding due to pain)  Communication   Communication: HOH  Cognition Arousal/Alertness: Awake/alert Behavior During Therapy: Anxious Overall Cognitive Status: Impaired/Different from baseline Area of  Impairment: Problem solving;Safety/judgement     Memory: Decreased short-term memory   Safety/Judgement: Decreased awareness of safety (unsafely trying to sit)   Problem Solving: Decreased initiation;Slow processing;Difficulty sequencing General Comments: Increased confusion after being up to [facility]    General Comments General comments (skin integrity, edema, etc.): Noted a bit of nystagmus.  Performed some oculomotor testing once pt in bed and question whether pt has BPPV or left hypofunction.  Pt too tired to perform more today therefore will further evaluate pt tomorrow.     Exercises General Exercises - Lower Extremity Ankle Circles/Pumps: AROM;Both;10 reps;Seated Long Arc Quad: AROM;Both;5 reps;Seated      Assessment/Plan    PT Assessment Patient needs continued PT services  PT Diagnosis Generalized weakness   PT Problem List Decreased activity tolerance;Decreased balance;Decreased mobility;Decreased knowledge of use of DME;Decreased safety awareness;Decreased knowledge of precautions  PT Treatment Interventions DME instruction;Gait training;Therapeutic activities;Functional mobility training;Therapeutic exercise;Balance training;Patient/family education   PT Goals (Current goals can be found in the Care Plan section) Acute Rehab PT Goals Patient Stated Goal: to get back to doing for myself PT Goal Formulation: With patient Time For Goal Achievement: 02/22/14 Potential to Achieve Goals: Good    Frequency Min 3X/week   Barriers to discharge        Co-evaluation               End of Session Equipment Utilized During Treatment: Gait belt Activity Tolerance: Patient limited by fatigue Patient left: with call bell/phone within reach;in bed;with family/visitor present Nurse Communication: Mobility status         Time: 0712-1975 PT Time Calculation (min): 25 min   Charges:   PT Evaluation $Initial PT Evaluation Tier I: 1 Procedure PT Treatments $Gait  Training: 8-22 mins   PT G Codes:          INGOLD,Roma Bierlein 02-18-2014, 2:03 PM [facility] Acute Rehabilitation [phone] (pager)

## 2014-02-08 NOTE — Progress Notes (Signed)
Occupational Therapy Treatment Patient Details Name: Lindsay Sheppard MRN: [id number] DOB: 07/13/1921 Today's Date: 02/08/2014    History of present illness  80 y.o. female with PMH of HTN, diabetes, HLD, B12 deficiency, CKD stage 3; recurrent UTI (on suppression therapy with bactrim); atrial fibrillation ( on coumadin), GERD and anemia; came to ED after experiencing a falling episode at home. Patient reports severe back pain. Patient is unsure if she pass out or just slipped. Had hx in the past of orthostatic hypotension and syncope. In ED she was found to have mild hyperkalemia and severe back pain; CT thorax demonstrated 7 compression fractures   OT comments  Pt with improved mobility today; however, when transferring from [facility] to recliner, pt became confused and stated she felt that she was going to "black out". Pt very pale, eyes "fluttered". Pt reclined. BP 216/111. HR 117. O2 96 RA. nsg notified. Will continue to follow.  Follow Up Recommendations  CIR;Supervision/Assistance - 24 hour    Equipment Recommendations  None recommended by OT    Recommendations for Other Services Rehab consult    Precautions / Restrictions Precautions Precautions: Fall Precaution Comments: watch BP       Mobility Bed Mobility Overal bed mobility: Needs Assistance Bed Mobility: Rolling;Sidelying to Sit Rolling: Min assist            Transfers Overall transfer level: Needs assistance Equipment used: Rolling walker (2 wheeled) Transfers: Sit to/from [facility] Sit to Stand: Mod assist Stand pivot transfers: Mod assist;+2 physical assistance            Balance Overall balance assessment: Needs assistance Sitting-balance support: Feet supported;Bilateral upper extremity supported Sitting balance-Leahy Scale: Fair     Standing balance support: Bilateral upper extremity supported;During functional activity Standing balance-Leahy Scale: Poor                      ADL Overall ADL's : Needs assistance/impaired                         Toilet Transfer: Stand-pivot;+2 for physical assistance;Moderate assistance Toilet Transfer Details (indicate cue type and reason): vc for safety Toileting- Clothing Manipulation and Hygiene: Minimal assistance Toileting - Clothing Manipulation Details (indicate cue type and reason): Pt able to wah her bottom. assistance to hold goen only     Functional mobility during ADLs: Moderate assistance;+2 for physical assistance;Rolling walker General ADL Comments: Improvement today      Vision                     Perception     Praxis      Cognition   Behavior During Therapy: Anxious Overall Cognitive Status: Impaired/Different from baseline Area of Impairment: Problem solving;Safety/judgement          Safety/Judgement: Decreased awareness of safety (unsafely trying to sit)   Problem Solving: Decreased initiation;Slow processing;Difficulty sequencing General Comments: Increased confusion after being up to [facility]    Extremity/Trunk Assessment               Exercises     Shoulder Instructions       General Comments      Pertinent Vitals/ Pain       BP 216/111. HR 117. O2 96  Home Living  Prior Functioning/Environment              Frequency Min 2X/week     Progress Toward Goals  OT Goals(current goals can now be found in the care plan section)  Progress towards OT goals: Progressing toward goals  Acute Rehab OT Goals Patient Stated Goal: to get back to doing for myself OT Goal Formulation: With patient Time For Goal Achievement: 02/22/14 Potential to Achieve Goals: Good  Plan Discharge plan remains appropriate    Co-evaluation                 End of Session Equipment Utilized During Treatment: Gait belt;Rolling walker   Activity Tolerance Patient limited by fatigue   Patient Left in  chair;with call bell/phone within reach;with nursing/sitter in room   Nurse Communication Mobility status;Other (comment) (BP concerns)        Time: 3300-7622 OT Time Calculation (min): 23 min  Charges: OT General Charges $OT Visit: 1 Procedure OT Treatments $Self Care/Home Management : 23-37 mins  Lajoyce Tamura,HILLARY 02/08/2014, 11:46 AM   Maurie Boettcher, OTR/L  [phone] 02/08/2014

## 2014-02-08 NOTE — Progress Notes (Addendum)
ANTICOAGULATION CONSULT NOTE - Follow Up Consult  Pharmacy Consult for coumadin Indication: atrial fibrillation  Allergies  Allergen Reactions  . Amiodarone     REACTION: f? fluid retention  . Deltasone [Prednisone]   . Diazepam     REACTION: agitation  . Diltiazem Hcl   . Levofloxacin     REACTION: nausea  . Pregabalin     REACTION: hallucinations  . Spironolactone     REACTION: elevated K at lowest dose  . Tequin [Gatifloxacin]     Patient Measurements: Height: 5\' 7"  (170.2 cm) Weight: 120 lb 1.6 oz (54.477 kg) IBW/kg (Calculated) : 61.6  Vital Signs: Temp: 98.5 F (36.9 C) (06/10 0514) Temp src: Oral (06/10 0514) BP: 179/90 mmHg (06/10 0514) Pulse Rate: 104 (06/10 0514)  Labs:  Recent Labs  02/07/14 0825 02/07/14 1706 02/07/14 2244 02/08/14 0420  HGB 12.8  --   --   --   HCT 40.5  --   --   --   PLT 146*  --   --   --   LABPROT 27.5*  --   --  34.8*  INR 2.67*  --   --  3.63*  CREATININE 1.38*  --   --   --   TROPONINI <0.30 <0.30 <0.30 <0.30    Estimated Creatinine Clearance: 23.3 ml/min (by C-G formula based on Cr of 1.38).   Assessment: Patient is a 80 y.o F on bactrim PTA for UTI prophylaxis.  Her daughter stated that she has been on this antibiotic years  Bactrim has significant drug-drug intxn with coumadin. However, patient's [facility] clinic been adjusting her coumadin regimen based on INRs while on bactrim. INR jumped from 2.67 to 3.63 today.  No bleeding documented.   Goal of Therapy:  INR 2-3    Plan:  1) hold coumadin today  Bucky Grigg P 02/08/2014,9:25 AM

## 2014-02-09 ENCOUNTER — Inpatient Hospital Stay (HOSPITAL_COMMUNITY)
Admission: RE | Admit: 2014-02-09 | Discharge: 2014-02-16 | DRG: 945 | Disposition: A | Discharge status: Home Health | Payer: Medicare Other | Source: Intra-hospital | Attending: Physical Medicine & Rehabilitation | Admitting: Physical Medicine & Rehabilitation

## 2014-02-09 ENCOUNTER — Telehealth: Payer: Self-pay | Admitting: Internal Medicine

## 2014-02-09 ENCOUNTER — Telehealth: Payer: Self-pay

## 2014-02-09 DIAGNOSIS — R471 Dysarthria and anarthria: Secondary | ICD-10-CM | POA: Diagnosis present

## 2014-02-09 DIAGNOSIS — K5900 Constipation, unspecified: Secondary | ICD-10-CM | POA: Diagnosis present

## 2014-02-09 DIAGNOSIS — I252 Old myocardial infarction: Secondary | ICD-10-CM

## 2014-02-09 DIAGNOSIS — I5032 Chronic diastolic (congestive) heart failure: Secondary | ICD-10-CM | POA: Diagnosis present

## 2014-02-09 DIAGNOSIS — Z794 Long term (current) use of insulin: Secondary | ICD-10-CM

## 2014-02-09 DIAGNOSIS — S3210XA Unspecified fracture of sacrum, initial encounter for closed fracture: Secondary | ICD-10-CM | POA: Diagnosis present

## 2014-02-09 DIAGNOSIS — E1142 Type 2 diabetes mellitus with diabetic polyneuropathy: Secondary | ICD-10-CM | POA: Diagnosis present

## 2014-02-09 DIAGNOSIS — I129 Hypertensive chronic kidney disease with stage 1 through stage 4 chronic kidney disease, or unspecified chronic kidney disease: Secondary | ICD-10-CM | POA: Diagnosis present

## 2014-02-09 DIAGNOSIS — K219 Gastro-esophageal reflux disease without esophagitis: Secondary | ICD-10-CM | POA: Diagnosis present

## 2014-02-09 DIAGNOSIS — N189 Chronic kidney disease, unspecified: Secondary | ICD-10-CM | POA: Diagnosis present

## 2014-02-09 DIAGNOSIS — I509 Heart failure, unspecified: Secondary | ICD-10-CM | POA: Diagnosis present

## 2014-02-09 DIAGNOSIS — W19XXXA Unspecified fall, initial encounter: Secondary | ICD-10-CM

## 2014-02-09 DIAGNOSIS — Z803 Family history of malignant neoplasm of breast: Secondary | ICD-10-CM

## 2014-02-09 DIAGNOSIS — Z79899 Other long term (current) drug therapy: Secondary | ICD-10-CM

## 2014-02-09 DIAGNOSIS — S322XXA Fracture of coccyx, initial encounter for closed fracture: Secondary | ICD-10-CM

## 2014-02-09 DIAGNOSIS — Z888 Allergy status to other drugs, medicaments and biological substances status: Secondary | ICD-10-CM

## 2014-02-09 DIAGNOSIS — D509 Iron deficiency anemia, unspecified: Secondary | ICD-10-CM | POA: Diagnosis present

## 2014-02-09 DIAGNOSIS — N184 Chronic kidney disease, stage 4 (severe): Secondary | ICD-10-CM | POA: Diagnosis present

## 2014-02-09 DIAGNOSIS — I10 Essential (primary) hypertension: Secondary | ICD-10-CM | POA: Diagnosis present

## 2014-02-09 DIAGNOSIS — R791 Abnormal coagulation profile: Secondary | ICD-10-CM | POA: Diagnosis present

## 2014-02-09 DIAGNOSIS — Z800 Family history of malignant neoplasm of digestive organs: Secondary | ICD-10-CM

## 2014-02-09 DIAGNOSIS — M810 Age-related osteoporosis without current pathological fracture: Secondary | ICD-10-CM | POA: Diagnosis present

## 2014-02-09 DIAGNOSIS — N390 Urinary tract infection, site not specified: Secondary | ICD-10-CM | POA: Diagnosis present

## 2014-02-09 DIAGNOSIS — M545 Low back pain, unspecified: Secondary | ICD-10-CM | POA: Diagnosis present

## 2014-02-09 DIAGNOSIS — K5909 Other constipation: Secondary | ICD-10-CM | POA: Diagnosis present

## 2014-02-09 DIAGNOSIS — I951 Orthostatic hypotension: Secondary | ICD-10-CM | POA: Diagnosis present

## 2014-02-09 DIAGNOSIS — S32009A Unspecified fracture of unspecified lumbar vertebra, initial encounter for closed fracture: Secondary | ICD-10-CM | POA: Diagnosis present

## 2014-02-09 DIAGNOSIS — M5137 Other intervertebral disc degeneration, lumbosacral region: Secondary | ICD-10-CM | POA: Diagnosis present

## 2014-02-09 DIAGNOSIS — E1149 Type 2 diabetes mellitus with other diabetic neurological complication: Secondary | ICD-10-CM | POA: Diagnosis present

## 2014-02-09 DIAGNOSIS — Z9849 Cataract extraction status, unspecified eye: Secondary | ICD-10-CM

## 2014-02-09 DIAGNOSIS — Y92009 Unspecified place in unspecified non-institutional (private) residence as the place of occurrence of the external cause: Secondary | ICD-10-CM

## 2014-02-09 DIAGNOSIS — Z961 Presence of intraocular lens: Secondary | ICD-10-CM

## 2014-02-09 DIAGNOSIS — W1811XA Fall from or off toilet without subsequent striking against object, initial encounter: Secondary | ICD-10-CM | POA: Diagnosis present

## 2014-02-09 DIAGNOSIS — Z7901 Long term (current) use of anticoagulants: Secondary | ICD-10-CM

## 2014-02-09 DIAGNOSIS — Z833 Family history of diabetes mellitus: Secondary | ICD-10-CM

## 2014-02-09 DIAGNOSIS — N259 Disorder resulting from impaired renal tubular function, unspecified: Secondary | ICD-10-CM | POA: Diagnosis present

## 2014-02-09 DIAGNOSIS — Z9089 Acquired absence of other organs: Secondary | ICD-10-CM

## 2014-02-09 DIAGNOSIS — Z823 Family history of stroke: Secondary | ICD-10-CM

## 2014-02-09 DIAGNOSIS — E538 Deficiency of other specified B group vitamins: Secondary | ICD-10-CM | POA: Diagnosis present

## 2014-02-09 DIAGNOSIS — Z5189 Encounter for other specified aftercare: Principal | ICD-10-CM

## 2014-02-09 DIAGNOSIS — T07XXXA Unspecified multiple injuries, initial encounter: Secondary | ICD-10-CM | POA: Diagnosis present

## 2014-02-09 DIAGNOSIS — E785 Hyperlipidemia, unspecified: Secondary | ICD-10-CM | POA: Diagnosis present

## 2014-02-09 DIAGNOSIS — T148XXA Other injury of unspecified body region, initial encounter: Secondary | ICD-10-CM

## 2014-02-09 DIAGNOSIS — S22009A Unspecified fracture of unspecified thoracic vertebra, initial encounter for closed fracture: Secondary | ICD-10-CM | POA: Diagnosis present

## 2014-02-09 DIAGNOSIS — M51379 Other intervertebral disc degeneration, lumbosacral region without mention of lumbar back pain or lower extremity pain: Secondary | ICD-10-CM | POA: Diagnosis present

## 2014-02-09 DIAGNOSIS — Z86711 Personal history of pulmonary embolism: Secondary | ICD-10-CM

## 2014-02-09 DIAGNOSIS — Z8249 Family history of ischemic heart disease and other diseases of the circulatory system: Secondary | ICD-10-CM

## 2014-02-09 DIAGNOSIS — Q762 Congenital spondylolisthesis: Secondary | ICD-10-CM

## 2014-02-09 DIAGNOSIS — Z86718 Personal history of other venous thrombosis and embolism: Secondary | ICD-10-CM

## 2014-02-09 DIAGNOSIS — I4891 Unspecified atrial fibrillation: Secondary | ICD-10-CM | POA: Diagnosis present

## 2014-02-09 LAB — GLUCOSE, CAPILLARY
Glucose-Capillary: 212 mg/dL — ABNORMAL HIGH (ref 70–99)
Glucose-Capillary: 278 mg/dL — ABNORMAL HIGH (ref 70–99)
Glucose-Capillary: 299 mg/dL — ABNORMAL HIGH (ref 70–99)
Glucose-Capillary: 128 mg/dL — ABNORMAL HIGH (ref 70–99)

## 2014-02-09 LAB — PROTIME-INR
INR: 4.76 — AB (ref 0.00–1.49)
PROTHROMBIN TIME: 42.8 s — AB (ref 11.6–15.2)

## 2014-02-09 LAB — BASIC METABOLIC PANEL
BUN: 43 mg/dL — AB (ref 6–23)
CO2: 22 mEq/L (ref 19–32)
Calcium: 9.2 mg/dL (ref 8.4–10.5)
Chloride: 99 mEq/L (ref 96–112)
Creatinine, Ser: 1.69 mg/dL — ABNORMAL HIGH (ref 0.50–1.10)
GFR, EST AFRICAN AMERICAN: 30 mL/min — AB (ref >90)
GFR, EST NON AFRICAN AMERICAN: 26 mL/min — AB (ref >90)
Glucose, Bld: 201 mg/dL — ABNORMAL HIGH (ref 70–99)
POTASSIUM: 4.9 meq/L (ref 3.7–5.3)
Sodium: 136 mEq/L — ABNORMAL LOW (ref 137–147)

## 2014-02-09 MED ORDER — LORATADINE 10 MG PO TABS
10.0000 mg | ORAL_TABLET | Freq: Every day | ORAL | Status: DC
  Administered 2014-02-10 – 2014-02-16 (×7): 10 mg via ORAL
  Filled 2014-02-09 (×8): qty 1

## 2014-02-09 MED ORDER — BISACODYL 10 MG RE SUPP: 10.0000 mg | Freq: Every day | RECTAL | Status: DC | PRN

## 2014-02-09 MED ORDER — INSULIN GLARGINE 100 UNIT/ML ~~LOC~~ SOLN: 10.0000 [IU] | Freq: Every day | SUBCUTANEOUS | Status: DC

## 2014-02-09 MED ORDER — ACETAMINOPHEN 325 MG PO TABS
650.0000 mg | ORAL_TABLET | Freq: Three times a day (TID) | ORAL | Status: DC
  Administered 2014-02-09 – 2014-02-16 (×20): 650 mg via ORAL
  Filled 2014-02-09 (×19): qty 2

## 2014-02-09 MED ORDER — POLYETHYLENE GLYCOL 3350 17 G PO PACK
17.0000 g | PACK | Freq: Two times a day (BID) | ORAL | Status: DC
  Administered 2014-02-09 – 2014-02-16 (×10): 17 g via ORAL
  Filled 2014-02-09 (×16): qty 1

## 2014-02-09 MED ORDER — SIMVASTATIN 10 MG PO TABS
10.0000 mg | ORAL_TABLET | Freq: Every day | ORAL | Status: DC
  Administered 2014-02-09 – 2014-02-15 (×7): 10 mg via ORAL
  Filled 2014-02-09 (×8): qty 1

## 2014-02-09 MED ORDER — OXYCODONE HCL 5 MG PO TABS
2.5000 mg | ORAL_TABLET | Freq: Four times a day (QID) | ORAL | Status: DC | PRN
  Administered 2014-02-10 – 2014-02-12 (×6): 5 mg via ORAL
  Filled 2014-02-09 (×6): qty 1

## 2014-02-09 MED ORDER — SENNA 8.6 MG PO TABS: 2.0000 | ORAL_TABLET | Freq: Every day | ORAL | Status: DC

## 2014-02-09 MED ORDER — MUSCLE RUB 10-15 % EX CREA
1.0000 "application " | TOPICAL_CREAM | Freq: Two times a day (BID) | CUTANEOUS | Status: DC
  Administered 2014-02-09 – 2014-02-16 (×14): 1 via TOPICAL
  Filled 2014-02-09: qty 85

## 2014-02-09 MED ORDER — ALUM & MAG HYDROXIDE-SIMETH 200-200-20 MG/5ML PO SUSP: 30.0000 mL | ORAL | Status: DC | PRN

## 2014-02-09 MED ORDER — PHYTONADIONE 5 MG PO TABS
2.5000 mg | ORAL_TABLET | Freq: Once | ORAL | Status: AC
  Administered 2014-02-09: 2.5 mg via ORAL
  Filled 2014-02-09: qty 1

## 2014-02-09 MED ORDER — SENNA 8.6 MG PO TABS
2.0000 | ORAL_TABLET | Freq: Every day | ORAL | Status: DC
  Administered 2014-02-10 – 2014-02-15 (×5): 17.2 mg via ORAL
  Filled 2014-02-09 (×8): qty 2

## 2014-02-09 MED ORDER — FLEET ENEMA 7-19 GM/118ML RE ENEM: 1.0000 | ENEMA | Freq: Every day | RECTAL | Status: DC | PRN

## 2014-02-09 MED ORDER — FERROUS SULFATE 325 (65 FE) MG PO TABS: 325.0000 mg | ORAL_TABLET | Freq: Every day | ORAL | Status: DC

## 2014-02-09 MED ORDER — CLONIDINE HCL 0.3 MG/24HR TD PTWK: 0.3000 mg | MEDICATED_PATCH | TRANSDERMAL | Status: DC

## 2014-02-09 MED ORDER — GUAIFENESIN-DM 100-10 MG/5ML PO SYRP: 5.0000 mL | ORAL_SOLUTION | Freq: Four times a day (QID) | ORAL | Status: DC | PRN

## 2014-02-09 MED ORDER — LABETALOL HCL 200 MG PO TABS
400.0000 mg | ORAL_TABLET | Freq: Two times a day (BID) | ORAL | Status: DC
  Administered 2014-02-09 – 2014-02-16 (×14): 400 mg via ORAL
  Filled 2014-02-09 (×16): qty 2

## 2014-02-09 MED ORDER — TROLAMINE SALICYLATE 10 % EX CREA
TOPICAL_CREAM | Freq: Two times a day (BID) | CUTANEOUS | Status: DC
  Filled 2014-02-09: qty 85

## 2014-02-09 MED ORDER — ONDANSETRON HCL 4 MG/2ML IJ SOLN: 4.0000 mg | Freq: Four times a day (QID) | INTRAMUSCULAR | Status: DC | PRN

## 2014-02-09 MED ORDER — FLUTICASONE PROPIONATE 50 MCG/ACT NA SUSP
2.0000 | Freq: Every day | NASAL | Status: DC
  Administered 2014-02-10 – 2014-02-16 (×7): 2 via NASAL
  Filled 2014-02-09: qty 16

## 2014-02-09 MED ORDER — INSULIN GLARGINE 100 UNIT/ML ~~LOC~~ SOLN
10.0000 [IU] | Freq: Every day | SUBCUTANEOUS | Status: DC
  Administered 2014-02-09: 10 [IU] via SUBCUTANEOUS
  Filled 2014-02-09: qty 0.1

## 2014-02-09 MED ORDER — INSULIN ASPART 100 UNIT/ML ~~LOC~~ SOLN: 0.0000 [IU] | Freq: Three times a day (TID) | SUBCUTANEOUS | Status: DC

## 2014-02-09 MED ORDER — WARFARIN - PHARMACIST DOSING INPATIENT
Freq: Every day | Status: DC
  Administered 2014-02-12: 18:00:00

## 2014-02-09 MED ORDER — ONDANSETRON HCL 4 MG PO TABS
4.0000 mg | ORAL_TABLET | Freq: Four times a day (QID) | ORAL | Status: DC | PRN
Start: 2014-02-09 — End: 2014-02-16

## 2014-02-09 MED ORDER — OXYCODONE HCL 5 MG PO TABS: 5.0000 mg | ORAL_TABLET | ORAL | Status: DC | PRN

## 2014-02-09 MED ORDER — ARTIFICIAL TEARS OP OINT
TOPICAL_OINTMENT | OPHTHALMIC | Status: DC | PRN
  Filled 2014-02-09: qty 3.5

## 2014-02-09 MED ORDER — NITROFURANTOIN MACROCRYSTAL 50 MG PO CAPS: 50.0000 mg | ORAL_CAPSULE | Freq: Every day | ORAL | Status: DC

## 2014-02-09 MED ORDER — INSULIN ASPART 100 UNIT/ML ~~LOC~~ SOLN
0.0000 [IU] | Freq: Three times a day (TID) | SUBCUTANEOUS | Status: DC
  Administered 2014-02-09 – 2014-02-10 (×2): 5 [IU] via SUBCUTANEOUS
  Administered 2014-02-10: 3 [IU] via SUBCUTANEOUS
  Administered 2014-02-10 – 2014-02-11 (×2): 2 [IU] via SUBCUTANEOUS
  Administered 2014-02-11: 3 [IU] via SUBCUTANEOUS
  Administered 2014-02-11: 5 [IU] via SUBCUTANEOUS
  Administered 2014-02-12: 3 [IU] via SUBCUTANEOUS
  Administered 2014-02-12: 5 [IU] via SUBCUTANEOUS
  Administered 2014-02-12: 2 [IU] via SUBCUTANEOUS
  Administered 2014-02-13 – 2014-02-14 (×4): 3 [IU] via SUBCUTANEOUS
  Administered 2014-02-14: 5 [IU] via SUBCUTANEOUS
  Administered 2014-02-14: 2 [IU] via SUBCUTANEOUS
  Administered 2014-02-15: 1 [IU] via SUBCUTANEOUS
  Administered 2014-02-15: 5 [IU] via SUBCUTANEOUS
  Administered 2014-02-15: 2 [IU] via SUBCUTANEOUS

## 2014-02-09 MED ORDER — WARFARIN SODIUM 1 MG PO TABS: ORAL_TABLET | ORAL | Status: DC

## 2014-02-09 MED ORDER — PANTOPRAZOLE SODIUM 40 MG PO TBEC
40.0000 mg | DELAYED_RELEASE_TABLET | Freq: Every day | ORAL | Status: DC
  Administered 2014-02-10 – 2014-02-16 (×7): 40 mg via ORAL
  Filled 2014-02-09 (×10): qty 1

## 2014-02-09 MED ORDER — INSULIN GLARGINE 100 UNIT/ML ~~LOC~~ SOLN
10.0000 [IU] | Freq: Every day | SUBCUTANEOUS | Status: DC
  Administered 2014-02-10 – 2014-02-14 (×5): 10 [IU] via SUBCUTANEOUS
  Filled 2014-02-09 (×8): qty 0.1

## 2014-02-09 MED ORDER — FERROUS SULFATE 325 (65 FE) MG PO TABS
325.0000 mg | ORAL_TABLET | Freq: Two times a day (BID) | ORAL | Status: DC
  Administered 2014-02-10 – 2014-02-16 (×13): 325 mg via ORAL
  Filled 2014-02-09 (×16): qty 1

## 2014-02-09 MED ORDER — TRAZODONE HCL 50 MG PO TABS: 25.0000 mg | ORAL_TABLET | Freq: Every evening | ORAL | Status: DC | PRN

## 2014-02-09 MED ORDER — ACETAMINOPHEN 325 MG PO TABS: 650.0000 mg | ORAL_TABLET | Freq: Three times a day (TID) | ORAL | Status: DC

## 2014-02-09 NOTE — Progress Notes (Signed)
ANTICOAGULATION CONSULT NOTE - Follow Up Consult  Pharmacy Consult for coumadin Indication: atrial fibrillation  Allergies  Allergen Reactions  . Amiodarone     REACTION: f? fluid retention  . Deltasone [Prednisone]   . Diazepam     REACTION: agitation  . Diltiazem Hcl   . Levofloxacin     REACTION: nausea  . Pregabalin     REACTION: hallucinations  . Spironolactone     REACTION: elevated K at lowest dose  . Tequin [Gatifloxacin]     Patient Measurements: Height: 5\' 7"  (170.2 cm) Weight: 116 lb 13.5 oz (53 kg) IBW/kg (Calculated) : 61.6   Vital Signs: Temp: 97.7 F (36.5 C) (06/11 0358) Temp src: Axillary (06/11 0358) BP: 140/85 mmHg (06/11 0358) Pulse Rate: 90 (06/11 0358)  Labs:  Recent Labs  02/07/14 0825 02/07/14 1706 02/07/14 2244 02/08/14 0420 02/09/14 0427  HGB 12.8  --   --   --   --   HCT 40.5  --   --   --   --   PLT 146*  --   --   --   --   LABPROT 27.5*  --   --  34.8* 42.8*  INR 2.67*  --   --  3.63* 4.76*  CREATININE 1.38*  --   --   --  1.69*  TROPONINI <0.30 <0.30 <0.30 <0.30  --     Estimated Creatinine Clearance: 18.5 ml/min (by C-G formula based on Cr of 1.69).  Assessment: Anticoagulation: Pt on coumadin PTA for afib-- also on bactrim PTA for UTI prophylaxis. CHADS-VASc score = 6. Baseline INR 2.67 on home dose of 2.5mg  daily. CBC ok at baseline. INR 2.67>>3.63>>4.76 (vit K 2.5mg  PO x1 ordered 6/11)  Infectious Disease: home bactrim resumed on admit; Her daughter said her mom been on bactrim for a couple of years -- takes it chronically for UTI px  Cardiovascular: HTN, HLD, diast HF; BP high, P 90, CE neg; on home clonidine patch, labetalol, zocor  Endocrinology: DM2 on SSI+lantus (cbgs 200s)  Gastrointestinal / Nutrition: GERD on PPI PO, miralax, senna  Neurology: h/o depression  Nephrology: CKD stage 3. SCr up 1.69 (baseline 1.6-1.7 over past 7 mos)  Pulmonary: flonase  Hematology / Oncology: h/o anemia; PO iron   Goal  of Therapy:  INR 2-3    Plan:  1) continue to hold coumadin today 2) FYI: I spoke to patient's PCP today (Dr. [name]) to inform him about the significant drug-drug intxns between coumadin and bactrim and whether switching to another antibiotic is feasible. He is agreeable with switching patient to an alternative antibiotic for UTI prophylaxis to help with regulating her INR while on coumadin. --Consider changing to keflex 250mg  daily if appropriate  Jaeson Molstad P 02/09/2014,12:35 PM

## 2014-02-09 NOTE — Consult Note (Signed)
Physical Medicine and Rehabilitation Consult Reason for Consult: Chronic lumbar thoracic compression fractures Referring Physician: [facility]   HPI: Lindsay Sheppard is a 80 y.o. right-handed female with history of hypertension, diabetes mellitus with peripheral neuropathy, chronic atrial fibrillation on Coumadin therapy and chronic renal insufficiency with baseline creatinine 1.7. Patient independent with rolling walker prior to admission living with her son. Presented 02/07/2014 after a fall. Cranial CT scan negative for acute changes. Cardiac enzymes negative. Patient with noted severe back pain. X-ray imaging CT lumbar thoracic spine with old sacral fracture, chronic L1 and L2 compression fractures with retropulsion of L2, chronic multilevel compression fractures with no acute thoracic compression fractures noted. Hospital course pain management with conservative care. Patient remains on chronic Coumadin therapy. Echocardiogram with ejection fraction of 55% and normal systolic function. Physical therapy evaluation completed 02/08/2014 with recommendations for physical medicine rehabilitation consult.   Review of Systems  Constitutional: Positive for malaise/fatigue.  Cardiovascular: Positive for palpitations.  Gastrointestinal: Positive for constipation.       GERD  Genitourinary: Positive for dysuria.  Musculoskeletal: Positive for back pain and falls.  Neurological: Positive for dizziness.  All other systems reviewed and are negative.  Past Medical History  Diagnosis Date  . POSTHERPETIC NEURALGIA 11/02/2009  . B12 DEFICIENCY 05/23/2007  . HYPERLIPIDEMIA 05/23/2007  . HYPERKALEMIA 05/30/2010  . ANEMIA-IRON DEFICIENCY 01/26/2007  . BLEPHARITIS, BILATERAL 02/23/2008  . Other specified forms of hearing loss 05/30/2010  . HYPERTENSION 01/26/2007  . Atrial fibrillation 05/23/2007  . DIASTOLIC HEART FAILURE, CHRONIC 12/14/2009  . POSTURAL HYPOTENSION 02/07/2008  . ALLERGIC RHINITIS 11/02/2009  .  GERD 01/26/2007  . CONSTIPATION 01/25/2008  . SKIN LESION 05/30/2010  . BACK PAIN 08/02/2007  . OSTEOPOROSIS 05/23/2007  . SYNCOPE 02/07/2008  . FATIGUE 09/06/2007  . Dysuria 09/05/2008  . Abdominal pain, epigastric 09/06/2007  . EPIGASTRIC TENDERNESS 10/12/2007  . PULMONARY EMBOLISM, HX OF 05/23/2007  . SHINGLES, HX OF 05/23/2007  . 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 Left foot pain   . Diabetic neuropathy   . RENAL INSUFFICIENCY 07/02/2009  . RENAL CYST, LEFT 09/06/2007  . Kidney stones     "never had OR"  . Family history of anesthesia complication     "daughter has PONV; another daughter a little anesthesia lasts way too long"  . CHF (congestive heart failure) 2011  . Myocardial infarction 2011    "mild"  . DVT (deep venous thrombosis) ~ 1964    "think it was in the left"  . Aspiration pneumonia ~ 2006    "due to aspiration"  . DIABETES MELLITUS, TYPE II 09/06/2007  . Arthritis     "hands" (02/07/2014)  . DEPRESSIVE DISORDER 01/25/2008    "@ times; never treated for it"  . Melanoma of nose   . Compression fracture of lumbar vertebra     hx  . Compression fracture of thoracic vertebra 02/07/2014    "fell this am" (02/07/2014   Past Surgical History  Procedure Laterality Date  . Appendectomy  1943  . Abdominal hysterectomy  1964  . Cardiac electrophysiology mapping and ablation  1999  . Cataract extraction w/ intraocular lens  implant, bilateral  2003-2005  . Carpal tunnel release Right 2004  . Mohs surgery  2011    "removed off her nose"  . Cardiac catheterization  10/2009  . Esophageal dilation  X 2   Family History  Problem Relation Age of Onset  . Breast cancer Mother   . Colon cancer  Father   . Stroke Father     died with stroke postop  . Diabetes Sister     2 sisters  . Hypertension Other    Social History:  reports that she has never smoked. She has never used smokeless tobacco. She reports that she does not drink alcohol or use illicit drugs. Allergies:  Allergies    Allergen Reactions  . Amiodarone     REACTION: f? fluid retention  . Deltasone [Prednisone]   . Diazepam     REACTION: agitation  . Diltiazem Hcl   . Levofloxacin     REACTION: nausea  . Pregabalin     REACTION: hallucinations  . Spironolactone     REACTION: elevated K at lowest dose  . Tequin [Gatifloxacin]    Medications Prior to Admission  Medication Sig Dispense Refill  . cloNIDine (CATAPRES - DOSED IN MG/24 HR) 0.3 mg/24hr patch Place 1 patch (0.3 mg total) onto the skin every 7 (seven) days.  12 patch  5  . Cyanocobalamin (VITAMIN B-12 IJ) Inject as directed every 30 (thirty) days.      . ferrous sulfate 325 (65 FE) MG tablet Take 325 mg by mouth 2 (two) times daily with a meal.       . fexofenadine (ALLEGRA) 180 MG tablet Take 180 mg by mouth every evening.       . fluticasone (FLONASE) 50 MCG/ACT nasal spray Place 2 sprays into the nose daily.  16 g  2  . furosemide (LASIX) 20 MG tablet Take every other day  90 tablet  3  . Hypromellose (ARTIFICIAL TEARS OP) Place 1 drop into both eyes daily.      . insulin glargine (LANTUS) 100 UNIT/ML injection Inject 7 units subcutaneously once a day      . labetalol (NORMODYNE) 200 MG tablet Take 400 mg by mouth 2 (two) times daily.      . metFORMIN (GLUCOPHAGE) 500 MG tablet Take 1 tablet (500 mg total) by mouth 2 (two) times daily with a meal. 1 po twice per day  180 tablet  3  . pantoprazole (PROTONIX) 40 MG tablet Take 1 tablet (40 mg total) by mouth daily.  90 tablet  3  . polyethylene glycol (MIRALAX / GLYCOLAX) packet Take 17 g by mouth daily.      . pravastatin (PRAVACHOL) 40 MG tablet Take 0.5 tablets (20 mg total) by mouth daily. 1/2 daily  45 tablet  3  . sulfamethoxazole-trimethoprim (BACTRIM DS,SEPTRA DS) 800-160 MG per tablet Take 1 tablet by mouth daily.  90 tablet  3  . warfarin (COUMADIN) 2.5 MG tablet Take 2.5 mg by mouth every evening.        Home: Home Living Family/patient expects to be discharged to:: Inpatient  rehab Living Arrangements: Children Additional Comments: Lives with son. [place]  Functional History: Prior Function Level of Independence: Independent with assistive device(s) Comments: Used RW Functional Status:  Mobility: Bed Mobility Overal bed mobility: Needs Assistance Bed Mobility: Sit to Supine Rolling: Min assist Sidelying to sit: Mod assist Sit to supine: Min assist General bed mobility comments: Needed assist for LES Transfers Overall transfer level: Needs assistance Equipment used: Rolling walker (2 wheeled) Transfers: Sit to/from Stand Sit to Stand: Mod assist;+2 physical assistance Stand pivot transfers: Mod assist;+2 physical assistance General transfer comment: Needed steadying assist initially.  Pt also needs a minute for BP issues.   Ambulation/Gait Ambulation/Gait assistance: Min assist;+2 physical assistance Ambulation  Distance (Feet): 15 Feet Assistive device: Rolling walker (2 wheeled) Gait Pattern/deviations: Step-to pattern;Decreased stride length;Antalgic Gait velocity interpretation: Below normal speed for age/gender General Gait Details: Needed assist to steer RW as well as guidance as to path.  Pt needs encouragement to continue.  Standing fully upright with RW but did c/o dizzines that was slight.  Pt's left LE was externally rotated.  Family states it has gotten progressively worse for some time now.      ADL: ADL Overall ADL's : Needs assistance/impaired Eating/Feeding: Set up Grooming: Minimal assistance Upper Body Bathing: Moderate assistance;Sitting Lower Body Bathing: Maximal assistance;Sit to/from stand Upper Body Dressing : Moderate assistance;Sitting Lower Body Dressing: Maximal assistance;Sit to/from stand Toilet Transfer: Stand-pivot;+2 for physical assistance;Moderate assistance Toilet Transfer Details (indicate cue type and reason): vc for safety Toileting- Clothing Manipulation and  Hygiene: Minimal assistance Toileting - Clothing Manipulation Details (indicate cue type and reason): Pt able to wah her bottom. assistance to hold goen only Functional mobility during ADLs: Moderate assistance;+2 for physical assistance;Rolling walker General ADL Comments: Improvement today  Cognition: Cognition Overall Cognitive Status: Impaired/Different from baseline Orientation Level: Oriented X4 Cognition Arousal/Alertness: Awake/alert Behavior During Therapy: Anxious Overall Cognitive Status: Impaired/Different from baseline Area of Impairment: Problem solving;Safety/judgement Memory: Decreased short-term memory Safety/Judgement: Decreased awareness of safety (unsafely trying to sit) Problem Solving: Decreased initiation;Slow processing;Difficulty sequencing General Comments: Increased confusion after being up to [facility]  Blood pressure 140/85, pulse 90, temperature 97.7 F (36.5 C), temperature source Axillary, resp. rate 18, height 5\' 7"  (1.702 m), weight 53 kg (116 lb 13.5 oz), SpO2 98.00%. Physical Exam  Constitutional: She is oriented to person, place, and time.  HENT:  Head: Normocephalic.  Eyes: EOM are normal.  Neck: Normal range of motion. Neck supple. No thyromegaly present.  Cardiovascular:  Cardiac rate controlled  Respiratory: Effort normal and breath sounds normal. No respiratory distress.  GI: Soft. Bowel sounds are normal. She exhibits no distension.  Neurological: She is alert and oriented to person, place, and time.  Patient makes good eye contact with examiner. HOH. Reasonable insight and awareness. Moves all 4's but proximal LE's limited by pain. UE's 5/5. HF 2+, KE 3+, ADF/APF 4/5. Stocking glove sensory loss to above the calves.   Skin: Skin is warm and dry.  Psychiatric: She has a normal mood and affect. Her behavior is normal. Thought content normal.    Results for orders placed during the hospital encounter of 02/07/14 (from the past 24 hour(s))    GLUCOSE, CAPILLARY     Status: Abnormal   Collection Time    02/08/14  6:59 AM      Result Value Ref Range   Glucose-Capillary 201 (*) 70 - 99 mg/dL   Comment 1 Documented in Chart     Comment 2 Notify RN    GLUCOSE, CAPILLARY     Status: Abnormal   Collection Time    02/08/14 11:18 AM      Result Value Ref Range   Glucose-Capillary 240 (*) 70 - 99 mg/dL  GLUCOSE, CAPILLARY     Status: Abnormal   Collection Time    02/08/14  4:16 PM      Result Value Ref Range   Glucose-Capillary 228 (*) 70 - 99 mg/dL   Comment 1 Documented in Chart     Comment 2 Notify RN    GLUCOSE, CAPILLARY     Status: Abnormal   Collection Time    02/08/14  9:23 PM      Result  Value Ref Range   Glucose-Capillary 249 (*) 70 - 99 mg/dL  PROTIME-INR     Status: Abnormal   Collection Time    02/09/14  4:27 AM      Result Value Ref Range   Prothrombin Time 42.8 (*) 11.6 - 15.2 seconds   INR 4.76 (*) 0.00 - 0.35  BASIC METABOLIC PANEL     Status: Abnormal   Collection Time    02/09/14  4:27 AM      Result Value Ref Range   Sodium 136 (*) 137 - 147 mEq/L   Potassium 4.9  3.7 - 5.3 mEq/L   Chloride 99  96 - 112 mEq/L   CO2 22  19 - 32 mEq/L   Glucose, Bld 201 (*) 70 - 99 mg/dL   BUN 43 (*) 6 - 23 mg/dL   Creatinine, Ser 1.69 (*) 0.50 - 1.10 mg/dL   Calcium 9.2  8.4 - 10.5 mg/dL   GFR calc non Af Amer 26 (*) >90 mL/min   GFR calc Af Amer 30 (*) >90 mL/min   Dg Chest 1 View  02/07/2014   CLINICAL DATA:  Back and chest pain secondary to a fall.  EXAM: CHEST - 1 VIEW  COMPARISON:  10/04/2013  FINDINGS: Heart size and pulmonary vascularity are normal. There is slight chronic could peribronchial thickening. No infiltrates or effusions. No definitive acute osseous abnormality. There are compression deformities at multiple levels in the thoracic spine these appear unchanged since the prior study.  IMPRESSION: No acute abnormalities.   Electronically Signed   By: Rozetta Nunnery M.D.   On: 02/07/2014 09:17   Ct Head  Wo Contrast  02/07/2014   CLINICAL DATA:  Neck and back pain secondary to a fall this morning.  EXAM: CT HEAD WITHOUT CONTRAST  CT CERVICAL SPINE WITHOUT CONTRAST  TECHNIQUE: Multidetector CT imaging of the head and cervical spine was performed following the standard protocol without intravenous contrast. Multiplanar CT image reconstructions of the cervical spine were also generated.  COMPARISON:  CT scan of the head dated 07/15/2008  FINDINGS: CT HEAD FINDINGS  No mass lesion. No midline shift. No acute hemorrhage or hematoma. No extra-axial fluid collections. No evidence of acute infarction. There is mild diffuse cerebral cortical atrophy without ventricular dilatation. No osseous abnormality.  CT CERVICAL SPINE FINDINGS  There is no acute fracture or prevertebral soft tissue swelling. There is a 1.5 mm spondylolisthesis of C6 on C7 due to moderate left facet arthritis. There is narrowing of the C5-6 disc space. There is a small central disc protrusion at C3-4 with no neural impingement. There is multilevel moderately severe left facet arthritis from C3-4 through C6-7. The C2-3 level is fused.  IMPRESSION: 1. No acute intracranial abnormality. 2. No acute abnormality of the cervical spine.   Electronically Signed   By: Rozetta Nunnery M.D.   On: 02/07/2014 10:09   Ct Cervical Spine Wo Contrast  02/07/2014   CLINICAL DATA:  Neck and back pain secondary to a fall this morning.  EXAM: CT HEAD WITHOUT CONTRAST  CT CERVICAL SPINE WITHOUT CONTRAST  TECHNIQUE: Multidetector CT imaging of the head and cervical spine was performed following the standard protocol without intravenous contrast. Multiplanar CT image reconstructions of the cervical spine were also generated.  COMPARISON:  CT scan of the head dated 07/15/2008  FINDINGS: CT HEAD FINDINGS  No mass lesion. No midline shift. No acute hemorrhage or hematoma. No extra-axial fluid collections. No evidence of acute infarction. There is  mild diffuse cerebral cortical  atrophy without ventricular dilatation. No osseous abnormality.  CT CERVICAL SPINE FINDINGS  There is no acute fracture or prevertebral soft tissue swelling. There is a 1.5 mm spondylolisthesis of C6 on C7 due to moderate left facet arthritis. There is narrowing of the C5-6 disc space. There is a small central disc protrusion at C3-4 with no neural impingement. There is multilevel moderately severe left facet arthritis from C3-4 through C6-7. The C2-3 level is fused.  IMPRESSION: 1. No acute intracranial abnormality. 2. No acute abnormality of the cervical spine.   Electronically Signed   By: Rozetta Nunnery M.D.   On: 02/07/2014 10:09   Ct Thoracic Spine Wo Contrast  02/07/2014   CLINICAL DATA:  80 year old woman following fall. Back pain. Spinal tenderness at the neck and radiating through the lumbar spine.  EXAM: CT THORACIC AND LUMBAR SPINE WITHOUT CONTRAST  TECHNIQUE: Multidetector CT imaging of the thoracic and lumbar spine was performed without contrast. Multiplanar CT image reconstructions were also generated.  COMPARISON:  Cervical spine CT 02/07/2014.  Abdominal CT 01/28/2008.  FINDINGS: CT THORACIC SPINE FINDINGS  Cervical rib its are present bilaterally at C7. Numbering was performed contiguously from the craniocervical junction on prior cervical spine CT and the cervical ribs as references. In the lumbar spine, there are 5 non-rib-bearing lumbar type vertebral bodies with rudimentary ribs at T12. Sacralization of the transverse processes of L5. Levoconvex curve of the thoracolumbar spine with the apex at T12. Multilevel compression fractures are present. Paraspinal soft tissues demonstrate a 16 mm left thyroid lobe cystic lesion. Atherosclerosis is present including coronary artery disease. Lungs show extensive calcification of the tracheobronchial tree. Small hiatal hernia incidentally noted.  No gross hematomas identified in the central canal. There is no definite thoracic paravertebral phlegmon to  suggest an acute compression fracture. Mild T2 superior endplate compression fracture. T3 a also shows a superior endplate compression fracture. T4 and T5 show preserved height and there appears to be ossification across the disc space and ankylosis. T6 shows a superior endplate compression fracture with 20% loss height and no retropulsion. T7 shows 25% loss of vertebral body height. T8 is preserved. T9 shows about 10% loss of vertebral body height without retropulsion. T10 is intact. T11 shows a superior endplate compression fracture with 25% loss of vertebral body height and mild retropulsion. T12 shows a biconcave compression fracture with 25% loss of vertebral body height but no retropulsion.  Osseous ridging is present throughout the thoracolumbar spine along with some calcified disc protrusions. No critical central stenosis is identified.  CT LUMBAR SPINE FINDINGS  Thoracic spinal alignment appears unchanged compared MRI 03/08/2012. Numbering convention used on the prior MRI is preserved. Sacral irregularity is present best seen on the sagittal images, which is a chronic finding seen on the MRI 03/18/2012.  Bilateral SI joint degenerative disease is present. Sclerotic lesion present in the left sacral ala appears new compared to CT 2009. This measures 8 mm and is indeterminate.  T12 biconcave compression fracture appears similar to prior MRI. L2 superior endplate compression fracture with retropulsion also appears unchanged. L3 shows preserved vertebral body height. L4 and L5 also show preserved vertebral body height without an acute fracture. Severe L4-L5 degenerative disc disease. Allowing for differences in technique, there is little if any interval change compared to prior MRI. Paraspinal soft tissues demonstrate dense aortoiliac and visceral atherosclerosis. Unchanged nodule in the left adrenal gland which may represent a large adenoma or myelolipoma.  IMPRESSION: CT  THORACIC SPINE IMPRESSION  1. 16 mm  left thyroid lobe cystic lesion. Consider further evaluation with thyroid ultrasound. If patient is clinically hyperthyroid, consider nuclear medicine thyroid uptake and scan. 2. Multilevel compression fractures detailed above. No definite paravertebral phlegmon to suggest an acute thoracic compression fracture. 3. Because of the multiplicity of thoracic compression fractures, MRI of the thoracic and lumbar spine should be considered to assess for bone marrow edema.  CT LUMBAR SPINE IMPRESSION  1. Indeterminate 8 mm sclerotic lesion in the left sacral ala, new compared to 2009. 2. Old sacral fracture demonstrated on prior MRI. 3. Chronic L1 and L2 compression fractures with retropulsion of L2. No definite acute osseous abnormality in the lumbar spine compared to prior MRI 03/18/2012. Moderate central stenosis associated with retropulsion of L2.   Electronically Signed   By: Dereck Ligas M.D.   On: 02/07/2014 10:47   Ct Lumbar Spine Wo Contrast  02/07/2014   CLINICAL DATA:  80 year old woman following fall. Back pain. Spinal tenderness at the neck and radiating through the lumbar spine.  EXAM: CT THORACIC AND LUMBAR SPINE WITHOUT CONTRAST  TECHNIQUE: Multidetector CT imaging of the thoracic and lumbar spine was performed without contrast. Multiplanar CT image reconstructions were also generated.  COMPARISON:  Cervical spine CT 02/07/2014.  Abdominal CT 01/28/2008.  FINDINGS: CT THORACIC SPINE FINDINGS  Cervical rib its are present bilaterally at C7. Numbering was performed contiguously from the craniocervical junction on prior cervical spine CT and the cervical ribs as references. In the lumbar spine, there are 5 non-rib-bearing lumbar type vertebral bodies with rudimentary ribs at T12. Sacralization of the transverse processes of L5. Levoconvex curve of the thoracolumbar spine with the apex at T12. Multilevel compression fractures are present. Paraspinal soft tissues demonstrate a 16 mm left thyroid lobe cystic  lesion. Atherosclerosis is present including coronary artery disease. Lungs show extensive calcification of the tracheobronchial tree. Small hiatal hernia incidentally noted.  No gross hematomas identified in the central canal. There is no definite thoracic paravertebral phlegmon to suggest an acute compression fracture. Mild T2 superior endplate compression fracture. T3 a also shows a superior endplate compression fracture. T4 and T5 show preserved height and there appears to be ossification across the disc space and ankylosis. T6 shows a superior endplate compression fracture with 20% loss height and no retropulsion. T7 shows 25% loss of vertebral body height. T8 is preserved. T9 shows about 10% loss of vertebral body height without retropulsion. T10 is intact. T11 shows a superior endplate compression fracture with 25% loss of vertebral body height and mild retropulsion. T12 shows a biconcave compression fracture with 25% loss of vertebral body height but no retropulsion.  Osseous ridging is present throughout the thoracolumbar spine along with some calcified disc protrusions. No critical central stenosis is identified.  CT LUMBAR SPINE FINDINGS  Thoracic spinal alignment appears unchanged compared MRI 03/08/2012. Numbering convention used on the prior MRI is preserved. Sacral irregularity is present best seen on the sagittal images, which is a chronic finding seen on the MRI 03/18/2012.  Bilateral SI joint degenerative disease is present. Sclerotic lesion present in the left sacral ala appears new compared to CT 2009. This measures 8 mm and is indeterminate.  T12 biconcave compression fracture appears similar to prior MRI. L2 superior endplate compression fracture with retropulsion also appears unchanged. L3 shows preserved vertebral body height. L4 and L5 also show preserved vertebral body height without an acute fracture. Severe L4-L5 degenerative disc disease. Allowing for differences in technique,  there is  little if any interval change compared to prior MRI. Paraspinal soft tissues demonstrate dense aortoiliac and visceral atherosclerosis. Unchanged nodule in the left adrenal gland which may represent a large adenoma or myelolipoma.  IMPRESSION: CT THORACIC SPINE IMPRESSION  1. 16 mm left thyroid lobe cystic lesion. Consider further evaluation with thyroid ultrasound. If patient is clinically hyperthyroid, consider nuclear medicine thyroid uptake and scan. 2. Multilevel compression fractures detailed above. No definite paravertebral phlegmon to suggest an acute thoracic compression fracture. 3. Because of the multiplicity of thoracic compression fractures, MRI of the thoracic and lumbar spine should be considered to assess for bone marrow edema.  CT LUMBAR SPINE IMPRESSION  1. Indeterminate 8 mm sclerotic lesion in the left sacral ala, new compared to 2009. 2. Old sacral fracture demonstrated on prior MRI. 3. Chronic L1 and L2 compression fractures with retropulsion of L2. No definite acute osseous abnormality in the lumbar spine compared to prior MRI 03/18/2012. Moderate central stenosis associated with retropulsion of L2.   Electronically Signed   By: Dereck Ligas M.D.   On: 02/07/2014 10:47    Assessment/Plan: Diagnosis: lumbar compression and sacral fxs (acute on chronic) exacerbated by recent fall 1. Does the need for close, 24 hr/day medical supervision in concert with the patient's rehab needs make it unreasonable for this patient to be served in a less intensive setting? Yes 2. Co-Morbidities requiring supervision/potential complications: pain mgt, dm2 with neuropathy. Chf, htn 3. Due to bladder management, bowel management, safety, skin/wound care, disease management, medication administration, pain management and patient education, does the patient require 24 hr/day rehab nursing? Yes 4. Does the patient require coordinated care of a physician, rehab nurse, PT (1-2 hrs/day, 5 days/week) and OT  (1-2 hrs/day, 5 days/week) to address physical and functional deficits in the context of the above medical diagnosis(es)? Yes Addressing deficits in the following areas: balance, endurance, locomotion, strength, transferring, bowel/bladder control, bathing, dressing, feeding, grooming and toileting 5. Can the patient actively participate in an intensive therapy program of at least 3 hrs of therapy per day at least 5 days per week? Yes 6. The potential for patient to make measurable gains while on inpatient rehab is excellent 7. Anticipated functional outcomes upon discharge from inpatient rehab are modified independent and supervision  with PT, modified independent and supervision with OT, n/a with SLP. 8. Estimated rehab length of stay to reach the above functional goals is: 12-16 days 9. Does the patient have adequate social supports to accommodate these discharge functional goals? Yes 10. Anticipated D/C setting: Home 11. Anticipated post D/C treatments: [facility] therapy 12. Overall Rehab/Functional Prognosis: excellent  RECOMMENDATIONS: This patient's condition is appropriate for continued rehabilitative care in the following setting: CIR Patient has agreed to participate in recommended program. Yes Note that insurance prior authorization may be required for reimbursement for recommended care.  Comment: Rehab Admissions Coordinator to follow up.  Thanks,  Meredith Staggers, MD, Mellody Drown     02/09/2014

## 2014-02-09 NOTE — Telephone Encounter (Signed)
[facility] called from [facility] having a question about Lindsay Sheppard's coumadin and bactram. Please call her back

## 2014-02-09 NOTE — Progress Notes (Signed)
Rehab admissions - I met with pt and her daughter to explain the possibility of inpatient rehab in follow up to rehab MD consult. Pt/dtr were both familiar with CIR as she had been to inpatient rehab twice before. "I remember making a pie while I was there." Questions were answered and they are both interested in pursuing inpatient rehab.  I called and spoke with Dr. Sullivan who stated that pt is medically stable for CIR. Bed is available and will admit pt to CIR later today.  I updated Julie, case manager and Lindsay, social worker as well as pt's RN. Please call me with any questions. I also got brief update from PT about pt's progress and possible vestibular needs. Thanks.   , PT Rehabilitation Admissions Coordinator [phone]'  

## 2014-02-09 NOTE — Progress Notes (Signed)
PMR Admission Coordinator Pre-Admission Assessment  Patient: Lindsay Sheppard is an 80 y.o., female  MRN: [id number]  DOB: Dec 04, 1920  Height: 5\' 7"  (170.2 cm)  Weight: 53 kg (116 lb 13.5 oz)  Insurance Information  HMO: PPO: PCP: IPA: 80/20: OTHER:  PRIMARY: Medicare A & B Policy#: [id number] Subscriber: self  CM Name: Phone#: Fax#:  Pre-Cert#: verified in Occupational psychologist: retired  Benefits: Phone #: Name:  Eff. Date: A & B: 06-01-88 Deduct: $1260 Out of Pocket Max: none Life Max: unlimited  CIR: 100% SNF: 100% days 1-20; 80% days 21-100 (100 day visit max)  Outpatient: 80% Co-Pay: 20%  Home Health: 100% Co-Pay: none, no visit limits  DME: 80% Co-Pay: 20%  Providers: pt's preference   SECONDARY: [employer] [place] Policy#: [id number] Subscriber: self  Benefits: Phone #: [phone]  Emergency Contact Information    Contact Information     Name  Relation  Home  Work  Blanford B  Daughter  [phone]   [phone]     May,Sharon  Daughter  [phone]   [phone]        Current Medical History  Patient Admitting Diagnosis: lumbar compression and sacral fxs (acute on chronic) exacerbated by recent fall  History of Present Illness: Lindsay Sheppard is a 80 y.o. right-handed female with history of hypertension, diabetes mellitus with peripheral neuropathy, chronic atrial fibrillation on Coumadin therapy and chronic renal insufficiency with baseline creatinine 1.7. Patient independent with rolling walker prior to admission living with her son. Presented 02/07/2014 after a fall. Cranial CT scan negative for acute changes. Cardiac enzymes negative. Patient with noted severe back pain. X-ray imaging CT lumbar thoracic spine with old sacral fracture, chronic L1 and L2 compression fractures with retropulsion of L2, chronic multilevel compression fractures with no acute thoracic compression fractures noted. Hospital course pain management with conservative care. Patient remains on  chronic Coumadin therapy. Echocardiogram with ejection fraction of 55% and normal systolic function. Physical therapy evaluation completed 02/08/2014 with recommendations for physical medicine rehabilitation consult.  Past Medical History    Past Medical History    Diagnosis  Date    .  POSTHERPETIC NEURALGIA  11/02/2009    .  B12 DEFICIENCY  05/23/2007    .  HYPERLIPIDEMIA  05/23/2007    .  HYPERKALEMIA  05/30/2010    .  ANEMIA-IRON DEFICIENCY  01/26/2007    .  BLEPHARITIS, BILATERAL  02/23/2008    .  Other specified forms of hearing loss  05/30/2010    .  HYPERTENSION  01/26/2007    .  Atrial fibrillation  05/23/2007    .  DIASTOLIC HEART FAILURE, CHRONIC  12/14/2009    .  POSTURAL HYPOTENSION  02/07/2008    .  ALLERGIC RHINITIS  11/02/2009    .  GERD  01/26/2007    .  CONSTIPATION  01/25/2008    .  SKIN LESION  05/30/2010    .  BACK PAIN  08/02/2007    .  OSTEOPOROSIS  05/23/2007    .  SYNCOPE  02/07/2008    .  FATIGUE  09/06/2007    .  Dysuria  09/05/2008    .  Abdominal pain, epigastric  09/06/2007    .  EPIGASTRIC TENDERNESS  10/12/2007    .  PULMONARY EMBOLISM, HX OF  05/23/2007    .  SHINGLES, HX OF  05/23/2007    .  Optic nerve hemorrhage  12/23/2010    .  Left  foot pain     .  Diabetic neuropathy     .  RENAL INSUFFICIENCY  07/02/2009    .  RENAL CYST, LEFT  09/06/2007    .  Kidney stones       "never had OR"    .  Family history of anesthesia complication       "daughter has PONV; another daughter a little anesthesia lasts way too long"    .  CHF (congestive heart failure)  2011    .  Myocardial infarction  2011      "mild"    .  DVT (deep venous thrombosis)  ~ 1964      "think it was in the left"    .  Aspiration pneumonia  ~ 2006      "due to aspiration"    .  DIABETES MELLITUS, TYPE II  09/06/2007    .  Arthritis       "hands" (02/07/2014)    .  DEPRESSIVE DISORDER  01/25/2008      "@ times; never treated for it"    .  Melanoma of nose     .  Compression fracture of lumbar vertebra       hx     .  Compression fracture of thoracic vertebra  02/07/2014      "fell this am" (02/07/2014     Family History  family history includes Breast cancer in her mother; Colon cancer in her father; Diabetes in her sister; Hypertension in her other; Stroke in her father.  Prior Rehab/Hospitalizations: pt has been to CIR twice. (about 8 years ago following a prolonged hospital course and in 2011 following a double pelvic fracture. "I remember they had me make a pie while I was in rehab there.")  Current Medications  Current facility-administered medications:0.9 % sodium chloride infusion, 250 mL, Intravenous, PRN, Barton Dubois, MD; acetaminophen (TYLENOL) tablet 650 mg, 650 mg, Oral, TID, Delfina Redwood, MD, 650 mg at 02/09/14 1016; artificial tears (LACRILUBE) ophthalmic ointment, , Both Eyes, Q4H PRN, Barton Dubois, MD; bisacodyl (DULCOLAX) suppository 10 mg, 10 mg, Rectal, Daily PRN, Delfina Redwood, MD  cloNIDine (CATAPRES - Dosed in mg/24 hr) patch 0.3 mg, 0.3 mg, Transdermal, Q7 days, Barton Dubois, MD; ferrous sulfate tablet 325 mg, 325 mg, Oral, BID WC, Barton Dubois, MD, 325 mg at 02/09/14 0626; fluticasone (FLONASE) 50 MCG/ACT nasal spray 2 spray, 2 spray, Each Nare, Daily, Barton Dubois, MD, 2 spray at 02/09/14 1016; insulin aspart (novoLOG) injection 0-9 Units, 0-9 Units, Subcutaneous, TID WC, Barton Dubois, MD, 3 Units at 02/09/14 0631  insulin glargine (LANTUS) injection 10 Units, 10 Units, Subcutaneous, Daily, Delfina Redwood, MD, 10 Units at 02/09/14 1016; labetalol (NORMODYNE) tablet 400 mg, 400 mg, Oral, BID, Barton Dubois, MD, 400 mg at 02/09/14 1018; loratadine (CLARITIN) tablet 10 mg, 10 mg, Oral, Daily, Barton Dubois, MD, 10 mg at 02/09/14 1018; morphine 2 MG/ML injection 1 mg, 1 mg, Intravenous, Q3H PRN, Barton Dubois, MD, 1 mg at 02/09/14 0843  ondansetron (ZOFRAN) injection 4 mg, 4 mg, Intravenous, Q6H PRN, Barton Dubois, MD; ondansetron [facility]) tablet 4 mg, 4 mg, Oral, Q6H PRN,  Barton Dubois, MD; oxyCODONE (Oxy IR/ROXICODONE) immediate release tablet 5 mg, 5 mg, Oral, Q4H PRN, Barton Dubois, MD, 5 mg at 02/08/14 1132; pantoprazole (PROTONIX) EC tablet 40 mg, 40 mg, Oral, Daily, Barton Dubois, MD, 40 mg at 02/09/14 1016  polyethylene glycol (MIRALAX / GLYCOLAX) packet 17 g, 17 g, Oral, BID, Corinna  Greggory Keen, MD, 17 g at 02/09/14 1017; senna (SENOKOT) tablet 17.2 mg, 2 tablet, Oral, Daily, Delfina Redwood, MD, 17.2 mg at 02/09/14 1017; simvastatin (ZOCOR) tablet 10 mg, 10 mg, Oral, q1800, Barton Dubois, MD, 10 mg at 02/08/14 1759; sodium chloride 0.9 % injection 3 mL, 3 mL, Intravenous, Q12H, Barton Dubois, MD, 3 mL at 02/07/14 2200  sodium chloride 0.9 % injection 3 mL, 3 mL, Intravenous, Q12H, Barton Dubois, MD, 3 mL at 02/09/14 1018; sodium chloride 0.9 % injection 3 mL, 3 mL, Intravenous, PRN, Barton Dubois, MD; sodium phosphate (FLEET) 7-19 GM/118ML enema 1 enema, 1 enema, Rectal, Daily PRN, Delfina Redwood, MD; Warfarin - Pharmacist Dosing Inpatient, , Does not apply, q1800, Sindy Guadeloupe, RPH  Patients Current Diet: heart healthy, carb modified  Precautions / Restrictions  Precautions: note pt is deaf in right ear and hard of hearing in left ear  Precautions: Fall  Precaution Comments: watch BP  Restrictions  Weight Bearing Restrictions: No  Prior Activity Level  Household: for the past 6 months, pt has been mainly staying at home and only getting out to MD appointments. She was getting out less and less due to the bad winter weather and health issues "I just didn't feel good."  Home Assistive Devices / [place] Devices/Equipment: Dentures (specify type);Eyeglasses;Walker (specify type);CBG Meter;Shower chair with back;Grab bars in shower  Prior Functional Level  Prior Function  Level of Independence: Independent with assistive device(s)  Comments: Used RW  Current Functional Level    Cognition  Overall Cognitive Status:  Impaired/Different from baseline  Orientation Level: Oriented X4  Safety/Judgement: Decreased awareness of safety (unsafely trying to sit)  General Comments: Increased confusion after being up to [facility]    Extremity Assessment  (includes Sensation/Coordination)      ADLs  Overall ADL's : Needs assistance/impaired  Eating/Feeding: Set up  Grooming: Minimal assistance  Upper Body Bathing: Moderate assistance;Sitting  Lower Body Bathing: Maximal assistance;Sit to/from stand  Upper Body Dressing : Moderate assistance;Sitting  Lower Body Dressing: Maximal assistance;Sit to/from stand  Toilet Transfer: Stand-pivot;+2 for physical assistance;Moderate assistance  Toilet Transfer Details (indicate cue type and reason): vc for safety  Toileting- Clothing Manipulation and Hygiene: Minimal assistance  Toileting - Clothing Manipulation Details (indicate cue type and reason): Pt able to wah her bottom. assistance to hold goen only  Functional mobility during ADLs: Moderate assistance;+2 for physical assistance;Rolling walker  General ADL Comments: Improvement today    Mobility  Overal bed mobility: Needs Assistance  Bed Mobility: Sit to Supine  Rolling: Min assist  Sidelying to sit: Mod assist  Sit to supine: Min assist  General bed mobility comments: Needed assist for LES    Transfers  Overall transfer level: Needs assistance  Equipment used: Rolling walker (2 wheeled)  Transfers: Sit to/from Stand  Sit to Stand: Mod assist;+2 physical assistance  Stand pivot transfers: Mod assist;+2 physical assistance  General transfer comment: Needed steadying assist initially. Pt also needs a minute for BP issues.    Ambulation / Gait / Stairs / Wheelchair Mobility  Ambulation/Gait  Ambulation/Gait assistance: Min assist;+2 physical assistance  Ambulation Distance (Feet): 15 Feet  Assistive device: Rolling walker (2 wheeled)  Gait Pattern/deviations: Step-to pattern;Decreased stride length;Antalgic  Gait  velocity interpretation: Below normal speed for age/gender  General Gait Details: Needed assist to steer RW as well as guidance as to path. Pt needs encouragement to continue. Standing fully upright with RW but did c/o dizzines that was slight.  Pt's left LE was externally rotated. Family states it has gotten progressively worse for some time now.  Note: acute PT made recommendations for possible further work up for vestibular issues.    Posture / Balance     Special needs/care consideration  BiPAP/CPAP no  CPM no  Continuous Drip IV no  Dialysis no  Life Vest no  Oxygen no  Special Bed no  Trach Size no  Wound Vac (area) no  Skin - tiny abrasion near right lateral elbow but no overt bruising/scrapes from recent fall per pt/dtr.  Bowel mgmt: last BM on 02-06-14  Bladder mgmt: using bedpan or BSC  Diabetic mgmt - yes, managed at home with medications and insulin    Previous Home Environment  Living Arrangements: [place]: No  Additional Comments: Lives with son. Per pt, "my son lives with me." Other family members available who [place]arm".  Discharge Living Setting  Plans for Discharge Living Setting: Patient's home  Type of Home at Discharge: House  Discharge Home Layout: Two level;Able to live on main level with bedroom/bathroom ("old farmhouse")  Alternate Level Stairs-Rails: (pt does not go upstairs)  Discharge Home Access: Stairs to enter  Entrance Stairs-Rails: None  Entrance Stairs-Number of Steps: 2 (one step to porch, one step to house)  Does the patient have any problems obtaining your medications?: No  Social/Family/Support Systems  Contact Information: dtr [name] is primary contact  Anticipated Caregiver: son and other daughters (5 children live on farm, but son lives with pt)  Anticipated Caregiver's Contact Information: see above  Ability/Limitations of Caregiver: no limitations, son just recently retired from [employer] but does do farming  work (5 children total live on plots around the family farm and can share caregiving needs)  Caregiver Availability: 24/7  Discharge Plan Discussed with Primary Caregiver: Yes (discussed with Dtr [name])  Is Caregiver In Agreement with Plan?: Yes  Does Caregiver/Family have Issues with Lodging/Transportation while Pt is in Rehab?: No  Goals/Additional Needs  Patient/Family Goal for Rehab: supervision and modified independence with PT and OT, NA for SLP  Expected length of stay: 12-16 days`  Cultural Considerations: Christian, enjoys attending [employer] when she felt well  Dietary Needs: heart healthy, carb modified  Equipment Needs: to be determined  Pt/Family Agrees to Admission and willing to participate: Yes (spoke with pt and dtr [name] on 6-11)  Program Orientation Provided & Reviewed with Pt/Caregiver Including Roles & Responsibilities: Yes  Decrease burden of Care through IP rehab admission: NA  Possible need for SNF placement upon discharge: not anticipated  Patient Condition: This patient's condition remains as documented in the consult dated 02-09-14, in which the Rehabilitation Physician determined and documented that the patient's condition is appropriate for intensive rehabilitative care in an inpatient rehabilitation facility. Will admit to inpatient rehab today.  Preadmission Screen Completed By: Nanetta Batty, PT 02/09/2014 10:34 AM  ______________________________________________________________________  Discussed status with Dr. Letta Pate on 02-09-14 at 1045 and received telephone approval for admission today.  Admission Coordinator: Nanetta Batty, PT time 1045/Date 02-09-14    Cosigned by: Charlett Blake, MD [02/09/2014 11:05 AM]

## 2014-02-09 NOTE — Progress Notes (Signed)
Patient is admitting to CIR today. Social worker will sign off at this time as there are no longer social work needs. Please re consult if social work needs arise.  Jeanette Caprice, MSW, [place]

## 2014-02-09 NOTE — Telephone Encounter (Signed)
Returned call to pharmacy at [facility].  The patient is currently in the hospital.  They are having problems regulating her coumadin as INR fluctuating so much, they think may be due to Bactrim she is on for UTI.  Is it ok to change her antibiotic.  Patient is being discharged today to rehab.  Advise

## 2014-02-09 NOTE — H&P (Signed)
Physical Medicine and Rehabilitation Admission H&P      Chief Complaint   Patient presents with   .  Acute on chronic sacral fracture, h/o lumbar compression Fx, gait disorder.       HPI: Lindsay Sheppard is a 80 y.o. right-handed female with history of HTN, DM type 2, postural hypotension, CAF--chronic coumadin, chronic back pain who fell while getting of the commode on 02/07/14 (question due to slipping or syncopal episode) and had onset of severe back pain. She was admitted for work up and Cranial CT scan negative for acute changes. Cardiac enzymes negative and 2D echocardiogram with ejection fraction of 55% and normal systolic function. CT of spine with cervical spondylolisthesis C6 on C7, multilevel compression fractures T3, T7, T11, T12, L2, severe DDD L4/L5 and old sacral fracture with new sclerotic lesion left sacral ala.   Physical therapy evaluation completed 02/08/2014 with recommendations for physical medicine rehabilitation consult.  Patient cannot exactly tell me whether she has her pain, midline in her low back approximately Movement exacerbate her pain No shooting pain down the legs. As constipation but no problem with her bladder   Review of Systems  Eyes: Negative for blurred vision.  Respiratory: Negative for cough and shortness of breath.   Cardiovascular: Negative for chest pain and leg swelling.  Gastrointestinal: Positive for vomiting. Negative for heartburn and nausea.  Genitourinary: Positive for urgency and frequency (Get up 3-4 times occasionally at nights. ).  Musculoskeletal: Positive for myalgias.  Neurological: Negative for dizziness, tremors and headaches.  Psychiatric/Behavioral: The patient has insomnia. The patient is not nervous/anxious.        Past Medical History   Diagnosis  Date   .  POSTHERPETIC NEURALGIA  11/02/2009   .  B12 DEFICIENCY  05/23/2007   .  HYPERLIPIDEMIA  05/23/2007   .  HYPERKALEMIA  05/30/2010   .  ANEMIA-IRON DEFICIENCY   01/26/2007   .  BLEPHARITIS, BILATERAL  02/23/2008   .  Other specified forms of hearing loss  05/30/2010   .  HYPERTENSION  01/26/2007   .  Atrial fibrillation  05/23/2007   .  DIASTOLIC HEART FAILURE, CHRONIC  12/14/2009   .  POSTURAL HYPOTENSION  02/07/2008   .  ALLERGIC RHINITIS  11/02/2009   .  GERD  01/26/2007   .  CONSTIPATION  01/25/2008   .  SKIN LESION  05/30/2010   .  BACK PAIN  08/02/2007   .  OSTEOPOROSIS  05/23/2007   .  SYNCOPE  02/07/2008   .  FATIGUE  09/06/2007   .  Dysuria  09/05/2008   .  Abdominal pain, epigastric  09/06/2007   .  EPIGASTRIC TENDERNESS  10/12/2007   .  PULMONARY EMBOLISM, HX OF  05/23/2007   .  SHINGLES, HX OF  05/23/2007   .  Optic nerve hemorrhage  12/23/2010   .  Left foot pain     .  Diabetic neuropathy     .  RENAL INSUFFICIENCY  07/02/2009   .  RENAL CYST, LEFT  09/06/2007   .  Kidney stones         "never had OR"   .  Family history of anesthesia complication         "daughter has PONV; another daughter a little anesthesia lasts way too long"   .  CHF (congestive heart failure)  2011   .  Myocardial infarction  2011       "mild"   .  DVT (deep venous thrombosis)  ~ 1964       "think it was in the left"   .  Aspiration pneumonia  ~ 2006       "due to aspiration"   .  DIABETES MELLITUS, TYPE II  09/06/2007   .  Arthritis         "hands" (02/07/2014)   .  DEPRESSIVE DISORDER  01/25/2008       "@ times; never treated for it"   .  Melanoma of nose     .  Compression fracture of lumbar vertebra         hx   .  Compression fracture of thoracic vertebra  02/07/2014       "fell this am" (02/07/2014    Past Surgical History   Procedure  Laterality  Date   .  Appendectomy    1943   .  Abdominal hysterectomy    1964   .  Cardiac electrophysiology mapping and ablation    1999   .  Cataract extraction w/ intraocular lens  implant, bilateral    2003-2005   .  Carpal tunnel release  Right  2004   .  Mohs surgery    2011       "removed off her nose"   .  Cardiac  catheterization    10/2009   .  Esophageal dilation    X 2    Family History   Problem  Relation  Age of Onset   .  Breast cancer  Mother     .  Colon cancer  Father     .  Stroke  Father         died with stroke postop   .  Diabetes  Sister         2 sisters   .  Hypertension  Other      Social History:  Widowed. Was a homemaker and helped run a farm. Independent with RW PTA. Son lives with her and assist with meals. Daughter helps with med management as well as house work. Sponge bathes almost daily.  She reports that she does not use tobacco,  drink alcohol or use illicit drugs.        Allergies   Allergen  Reactions   .  Amiodarone         REACTION: f? fluid retention   .  Deltasone [Prednisone]     .  Diazepam         REACTION: agitation   .  Diltiazem Hcl     .  Levofloxacin         REACTION: nausea   .  Pregabalin         REACTION: hallucinations   .  Spironolactone         REACTION: elevated K at lowest dose   .  Tequin [Gatifloxacin]      Medications Prior to Admission   Medication  Sig  Dispense  Refill   .  cloNIDine (CATAPRES - DOSED IN MG/24 HR) 0.3 mg/24hr patch  Place 1 patch (0.3 mg total) onto the skin every 7 (seven) days.   12 patch   5   .  Cyanocobalamin (VITAMIN B-12 IJ)  Inject as directed every 30 (thirty) days.         .  fexofenadine (ALLEGRA) 180 MG tablet  Take 180 mg by mouth every evening.          .  fluticasone (  FLONASE) 50 MCG/ACT nasal spray  Place 2 sprays into the nose daily.   16 g   2   .  furosemide (LASIX) 20 MG tablet  Take every other day   90 tablet   3   .  Hypromellose (ARTIFICIAL TEARS OP)  Place 1 drop into both eyes daily.         Marland Kitchen  labetalol (NORMODYNE) 200 MG tablet  Take 400 mg by mouth 2 (two) times daily.         .  metFORMIN (GLUCOPHAGE) 500 MG tablet  Take 1 tablet (500 mg total) by mouth 2 (two) times daily with a meal. 1 po twice per day   180 tablet   3   .  pantoprazole (PROTONIX) 40 MG tablet  Take 1 tablet (40  mg total) by mouth daily.   90 tablet   3   .  polyethylene glycol (MIRALAX / GLYCOLAX) packet  Take 17 g by mouth daily.         .  pravastatin (PRAVACHOL) 40 MG tablet  Take 0.5 tablets (20 mg total) by mouth daily. 1/2 daily   45 tablet   3   .  sulfamethoxazole-trimethoprim (BACTRIM DS,SEPTRA DS) 800-160 MG per tablet  Take 1 tablet by mouth daily.   90 tablet   3   .  [DISCONTINUED] ferrous sulfate 325 (65 FE) MG tablet  Take 325 mg by mouth 2 (two) times daily with a meal.          .  [DISCONTINUED] insulin glargine (LANTUS) 100 UNIT/ML injection  Inject 7 units subcutaneously once a day         .  [DISCONTINUED] warfarin (COUMADIN) 2.5 MG tablet  Take 2.5 mg by mouth every evening.            Home: Home Living Family/patient expects to be discharged to:: Inpatient rehab Living Arrangements: Children Additional Comments: Lives with son. Other family members available who live on the "farm".    Functional History: Prior Function Level of Independence: Independent with assistive device(s) Comments: Used RW   Functional Status:   Mobility: Bed Mobility Overal bed mobility: Needs Assistance Bed Mobility: Sit to Supine Rolling: Min assist Sidelying to sit: Mod assist Sit to supine: Min assist General bed mobility comments: Needed assist for LES Transfers Overall transfer level: Needs assistance Equipment used: Rolling walker (2 wheeled) Transfers: Sit to/from Stand Sit to Stand: Mod assist;+2 physical assistance Stand pivot transfers: Mod assist;+2 physical assistance General transfer comment: Needed steadying assist initially.  Pt also needs a minute for BP issues.   Ambulation/Gait Ambulation/Gait assistance: Min assist;+2 physical assistance Ambulation Distance (Feet): 15 Feet Assistive device: Rolling walker (2 wheeled) Gait Pattern/deviations: Step-to pattern;Decreased stride length;Antalgic Gait velocity interpretation: Below normal speed for age/gender General Gait  Details: Needed assist to steer RW as well as guidance as to path.  Pt needs encouragement to continue.  Standing fully upright with RW but did c/o dizzines that was slight.  Pt's left LE was externally rotated.  Family states it has gotten progressively worse for some time now.     ADL: ADL Overall ADL's : Needs assistance/impaired Eating/Feeding: Set up Grooming: Minimal assistance Upper Body Bathing: Moderate assistance;Sitting Lower Body Bathing: Maximal assistance;Sit to/from stand Upper Body Dressing : Moderate assistance;Sitting Lower Body Dressing: Maximal assistance;Sit to/from stand Toilet Transfer: Stand-pivot;+2 for physical assistance;Moderate assistance Toilet Transfer Details (indicate cue type and reason): vc for safety Toileting- Clothing Manipulation and Hygiene: Minimal  assistance Toileting - Clothing Manipulation Details (indicate cue type and reason): Pt able to wah her bottom. assistance to hold goen only Functional mobility during ADLs: Moderate assistance;+2 for physical assistance;Rolling walker General ADL Comments: Improvement today   Cognition: Cognition Overall Cognitive Status: Impaired/Different from baseline Orientation Level: Oriented X4 Cognition Arousal/Alertness: Awake/alert Behavior During Therapy: Anxious Overall Cognitive Status: Impaired/Different from baseline Area of Impairment: Problem solving;Safety/judgement Memory: Decreased short-term memory Safety/Judgement: Decreased awareness of safety (unsafely trying to sit) Problem Solving: Decreased initiation;Slow processing;Difficulty sequencing General Comments: Increased confusion after being up to [facility]   Physical Exam: Blood pressure 140/85, pulse 90, temperature 97.7 F (36.5 C), temperature source Axillary, resp. rate 18, height $RemoveBe'5\' 7"'MAaQAdYQV$  (1.702 m), weight 53 kg (116 lb 13.5 oz), SpO2 98.00%. Physical Exam  Nursing note and vitals reviewed. Constitutional: She is oriented to person,  place, and time.  Thin elderly female. Appears younger than stated age.   HENT:   Head: Normocephalic and atraumatic.  Eyes: Conjunctivae are normal. Pupils are equal, round, and reactive to light.  Cardiovascular: Normal rate and regular rhythm.   Respiratory: Effort normal and breath sounds normal.  GI: Soft. Bowel sounds are normal. She exhibits no distension. There is tenderness.  Musculoskeletal: She exhibits no edema.  Neurological: She is alert and oriented to person, place, and time.  HOH. Mild dysarthria. Follows basic commands without difficulty.   Skin: Skin is warm and dry. No evidence of he'll break down. There is erythema around the C7 spinous process but no evidence of skin breakdown.   Motor strength is 4/5 bilateral biceps triceps and grip 3 minus at the deltoid's related to pain in her back Lower extremity strength is 4 minus at the hip flexors knee extensors ankle dorsiflexors. Sensory reduced to light touch as well as proprioception in both feet below the ankle.  Results for orders placed during the hospital encounter of 02/07/14 (from the past 48 hour(s))   GLUCOSE, CAPILLARY     Status: Abnormal     Collection Time      02/07/14  4:57 PM       Result  Value  Ref Range     Glucose-Capillary  134 (*)  70 - 99 mg/dL   TSH     Status: None     Collection Time      02/07/14  5:00 PM       Result  Value  Ref Range     TSH  3.180   0.350 - 4.500 uIU/mL   VITAMIN B12     Status: None     Collection Time      02/07/14  5:06 PM       Result  Value  Ref Range     Vitamin B-12  520   211 - 911 pg/mL     Comment:  Performed at [facility]     Status: None     Collection Time      02/07/14  5:06 PM       Result  Value  Ref Range     Magnesium  1.7   1.5 - 2.5 mg/dL   PHOSPHORUS     Status: None     Collection Time      02/07/14  5:06 PM       Result  Value  Ref Range     Phosphorus  3.0   2.3 - 4.6 mg/dL   HEMOGLOBIN A1C     Status:  Abnormal      Collection Time      02/07/14  5:06 PM       Result  Value  Ref Range     Hemoglobin A1C  6.5 (*)  <5.7 %     Comment:  (NOTE)                                                                                     According to the ADA Clinical Practice Recommendations for 2011, when        HbA1c is used as a screening test:         >=6.5%   Diagnostic of Diabetes Mellitus                  (if abnormal result is confirmed)        5.7-6.4%   Increased risk of developing Diabetes Mellitus        References:Diagnosis and Classification of Diabetes Mellitus,Diabetes        Care,2011,34(Suppl 1):S62-S69 and Standards of Medical Care in                Diabetes - 2011,Diabetes Care,2011,34 (Suppl 1):S11-S61.     Mean Plasma Glucose  140 (*)  <117 mg/dL     Comment:  Performed at [facility]   TROPONIN I     Status: None     Collection Time      02/07/14  5:06 PM       Result  Value  Ref Range     Troponin I  <0.30   <0.30 ng/mL     Comment:                Due to the release kinetics of cTnI,        a negative result within the first hours        of the onset of symptoms does not rule out        myocardial infarction with certainty.        If myocardial infarction is still suspected,        repeat the test at appropriate intervals.   GLUCOSE, CAPILLARY     Status: Abnormal     Collection Time      02/07/14  9:50 PM       Result  Value  Ref Range     Glucose-Capillary  209 (*)  70 - 99 mg/dL     Comment 1  Notify RN      TROPONIN I     Status: None     Collection Time      02/07/14 10:44 PM       Result  Value  Ref Range     Troponin I  <0.30   <0.30 ng/mL     Comment:                Due to the release kinetics of cTnI,        a negative result within the first hours        of the onset of symptoms does not rule out        myocardial  infarction with certainty.        If myocardial infarction is still suspected,        repeat the test at appropriate intervals.   TROPONIN I      Status: None     Collection Time      02/08/14  4:20 AM       Result  Value  Ref Range     Troponin I  <0.30   <0.30 ng/mL     Comment:                Due to the release kinetics of cTnI,        a negative result within the first hours        of the onset of symptoms does not rule out        myocardial infarction with certainty.        If myocardial infarction is still suspected,        repeat the test at appropriate intervals.   PROTIME-INR     Status: Abnormal     Collection Time      02/08/14  4:20 AM       Result  Value  Ref Range     Prothrombin Time  34.8 (*)  11.6 - 15.2 seconds     INR  3.63 (*)  0.00 - 1.49   CORTISOL     Status: None     Collection Time      02/08/14  4:20 AM       Result  Value  Ref Range     Cortisol, Plasma  20.3        Comment:  (NOTE)        AM:  4.3 - 22.4 ug/dL        PM:  3.1 - 15.1 ug/dL        Performed at [facility]   GLUCOSE, CAPILLARY     Status: Abnormal     Collection Time      02/08/14  6:59 AM       Result  Value  Ref Range     Glucose-Capillary  201 (*)  70 - 99 mg/dL     Comment 1  Documented in Chart        Comment 2  Notify RN      GLUCOSE, CAPILLARY     Status: Abnormal     Collection Time      02/08/14 11:18 AM       Result  Value  Ref Range     Glucose-Capillary  240 (*)  70 - 99 mg/dL   GLUCOSE, CAPILLARY     Status: Abnormal     Collection Time      02/08/14  4:16 PM       Result  Value  Ref Range     Glucose-Capillary  228 (*)  70 - 99 mg/dL     Comment 1  Documented in Chart        Comment 2  Notify RN      GLUCOSE, CAPILLARY     Status: Abnormal     Collection Time      02/08/14  9:23 PM       Result  Value  Ref Range     Glucose-Capillary  249 (*)  70 - 99 mg/dL   PROTIME-INR     Status: Abnormal     Collection Time      02/09/14  4:27 AM  Result  Value  Ref Range     Prothrombin Time  42.8 (*)  11.6 - 15.2 seconds     INR  4.76 (*)  0.00 - 1.49   BASIC METABOLIC PANEL     Status:  Abnormal     Collection Time      02/09/14  4:27 AM       Result  Value  Ref Range     Sodium  136 (*)  137 - 147 mEq/L     Potassium  4.9   3.7 - 5.3 mEq/L     Chloride  99   96 - 112 mEq/L     CO2  22   19 - 32 mEq/L     Glucose, Bld  201 (*)  70 - 99 mg/dL     BUN  43 (*)  6 - 23 mg/dL     Creatinine, Ser  8.23 (*)  0.50 - 1.10 mg/dL     Calcium  9.2   8.4 - 10.5 mg/dL     GFR calc non Af Amer  26 (*)  >90 mL/min     GFR calc Af Amer  30 (*)  >90 mL/min     Comment:  (NOTE)        The eGFR has been calculated using the CKD EPI equation.        This calculation has not been validated in all clinical situations.        eGFR's persistently <90 mL/min signify possible Chronic Kidney        Disease.   GLUCOSE, CAPILLARY     Status: Abnormal     Collection Time      02/09/14  6:27 AM       Result  Value  Ref Range     Glucose-Capillary  212 (*)  70 - 99 mg/dL    No results found.         Medical Problem List and Plan: 1. Functional deficits secondary to lumbar compression and sacral fxs (acute on chronic) exacerbated by recent fall 2.  DVT Prophylaxis/Anticoagulation: Pharmaceutical: Coumadin 3. Pain Management: Will continue tylenol 650 mg tid. Ice and/or heat prn for comfort. Oxycodone prn for severe pain.   4. Mood:  LCSW to follow for evaluation and support.   5. Neuropsych: This patient is capable of making decisions on her own behalf. 6. DM type 2: Will monitor with ac/hs checks. Continue Lantus 10 units daiy with SSI for elevated BS.   7. HTN: Monitor every 8 hours. Continue catapres TTS 3 patch and labetalol bid.    8. Chronic constipation: Miralax bid as well as senna daily to avoid recurrent issues with constipation and rectal pain. Limit narcotics.   9. CAF: Monitor HR every 8 hours. On coumadin with INR closely monitored by PharmD 10. Iron deficiency anemia: Continue iron supplement bid.  11. Chronic diastolic dysfunction: check daily weights. Low salt diet.  Monitor for signs of overload. Continue to hold lasix for now.   12. CKD: baseline Cr-1.7. BUN with mild elevation. Encourage po fluids.. 13. H/o orthostatic hypotension/syncope:  Question vasovagal reaction--check orthostatic BP/pulse and not to be left alone while in BR.     Post Admission Physician Evaluation: 1. Functional deficits secondary  To lumbar compression and sacral fxs (acute on chronic) exacerbated by recent fall    Patient is admitted to receive collaborative, interdisciplinary care between the physiatrist, rehab nursing staff, and therapy team. Patient's level of medical complexity and  substantial therapy needs in context of that medical necessity cannot be provided at a lesser intensity of care such as a SNF. Patient has experienced substantial functional loss from his/her baseline which was documented above under the "Functional History" and "Functional Status" headings.  Judging by the patient's diagnosis, physical exam, and functional history, the patient has potential for functional progress which will result in measurable gains while on inpatient rehab.  These gains will be of substantial and practical use upon discharge  in facilitating mobility and self-care at the household level. Physiatrist will provide 24 hour management of medical needs as well as oversight of the therapy plan/treatment and provide guidance as appropriate regarding the interaction of the two. 24 hour rehab nursing will assist with bladder management, bowel management, safety, skin/wound care, disease management, medication administration and pain management  and help integrate therapy concepts, techniques,education, etc. PT will assess and treat for/with: pre gait, gait training, endurance , safety, equipment, neuromuscular re education.   Goals are: Sup. OT will assess and treat for/with: ADLs, Cognitive perceptual skills, Neuromuscular re education, safety, endurance, equipment .   Goals are: Sup. SLP  will assess and treat for/with: NA.  Goals are: NA. Case Management and Social Worker will assess and treat for psychological issues and discharge planning. Team conference will be held weekly to assess progress toward goals and to determine barriers to discharge. Patient will receive at least 3 hours of therapy per day at least 5 days per week. ELOS: 12-16 days        Prognosis:  good   Charlett Blake M.D. [facility] FAAPM&R (Sports Med, Neuromuscular Med) Diplomate Am Board of Electrodiagnostic Med  02/09/2014

## 2014-02-09 NOTE — Telephone Encounter (Signed)
Ok to change the bactrim to qhs nitrofurantoin - done erx to [facility]

## 2014-02-09 NOTE — PMR Pre-admission (Signed)
PMR Admission Coordinator Pre-Admission Assessment  Patient: Lindsay Sheppard is an 80 y.o., female MRN: [id number] DOB: 1921-07-30 Height: 5\' 7"  (170.2 cm) Weight: 53 kg (116 lb 13.5 oz)              Insurance Information HMO:     PPO:      PCP:      IPA:      80/20:      OTHER:  PRIMARY: Medicare A & B      Policy#: [id number]      Subscriber: self  CM Name:       Phone#:      Fax#:  Pre-Cert#: verified in Visual merchandiser: retired Benefits:  Phone #:      Name:  Eff. Date: A & B: 06-01-88 Deduct: $1260      Out of Pocket Max: none      Life Max: unlimited CIR: 100%      SNF: 100% days 1-20; 80% days 21-100 (100 day visit max) Outpatient: 80%     Co-Pay: 20% Home Health: 100%      Co-Pay: none, no visit limits DME: 80%     Co-Pay: 20% Providers: pt's preference  SECONDARY: [employer] [place]      Policy#: [id number]      Subscriber: self Benefits:  Phone #: [phone]       Emergency Contact Information Contact Information   Name Relation Home Work [place] B Daughter [phone]  [phone]   May,Sharon Daughter [phone]  [phone]     Current Medical History  Patient Admitting Diagnosis: lumbar compression and sacral fxs (acute on chronic) exacerbated by recent fall  History of Present Illness: Lindsay Sheppard is a 80 y.o. right-handed female with history of hypertension, diabetes mellitus with peripheral neuropathy, chronic atrial fibrillation on Coumadin therapy and chronic renal insufficiency with baseline creatinine 1.7. Patient independent with rolling walker prior to admission living with her son. Presented 02/07/2014 after a fall. Cranial CT scan negative for acute changes. Cardiac enzymes negative. Patient with noted severe back pain. X-ray imaging CT lumbar thoracic spine with old sacral fracture, chronic L1 and L2 compression fractures with retropulsion of L2, chronic multilevel compression fractures with no acute thoracic compression fractures  noted. Hospital course pain management with conservative care. Patient remains on chronic Coumadin therapy. Echocardiogram with ejection fraction of 55% and normal systolic function. Physical therapy evaluation completed 02/08/2014 with recommendations for physical medicine rehabilitation consult.  Past Medical History  Past Medical History  Diagnosis Date  . POSTHERPETIC NEURALGIA 11/02/2009  . B12 DEFICIENCY 05/23/2007  . HYPERLIPIDEMIA 05/23/2007  . HYPERKALEMIA 05/30/2010  . ANEMIA-IRON DEFICIENCY 01/26/2007  . BLEPHARITIS, BILATERAL 02/23/2008  . Other specified forms of hearing loss 05/30/2010  . HYPERTENSION 01/26/2007  . Atrial fibrillation 05/23/2007  . DIASTOLIC HEART FAILURE, CHRONIC 12/14/2009  . POSTURAL HYPOTENSION 02/07/2008  . ALLERGIC RHINITIS 11/02/2009  . GERD 01/26/2007  . CONSTIPATION 01/25/2008  . SKIN LESION 05/30/2010  . BACK PAIN 08/02/2007  . OSTEOPOROSIS 05/23/2007  . SYNCOPE 02/07/2008  . FATIGUE 09/06/2007  . Dysuria 09/05/2008  . Abdominal pain, epigastric 09/06/2007  . EPIGASTRIC TENDERNESS 10/12/2007  . PULMONARY EMBOLISM, HX OF 05/23/2007  . SHINGLES, HX OF 05/23/2007  . 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 Left foot pain   . Diabetic neuropathy   . RENAL INSUFFICIENCY 07/02/2009  . RENAL CYST, LEFT 09/06/2007  . Kidney stones     "never had OR"  .  Family history of anesthesia complication     "daughter has PONV; another daughter a little anesthesia lasts way too long"  . CHF (congestive heart failure) 2011  . Myocardial infarction 2011    "mild"  . DVT (deep venous thrombosis) ~ 1964    "think it was in the left"  . Aspiration pneumonia ~ 2006    "due to aspiration"  . DIABETES MELLITUS, TYPE II 09/06/2007  . Arthritis     "hands" (02/07/2014)  . DEPRESSIVE DISORDER 01/25/2008    "@ times; never treated for it"  . Melanoma of nose   . Compression fracture of lumbar vertebra     hx  . Compression fracture of thoracic vertebra 02/07/2014    "fell this am" (02/07/2014     Family History  family history includes Breast cancer in her mother; Colon cancer in her father; Diabetes in her sister; Hypertension in her other; Stroke in her father.  Prior Rehab/Hospitalizations: pt has been to CIR twice. (about 8 years ago following a prolonged hospital course and in 2011 following a double pelvic fracture. "I remember they had me make a pie while I was in rehab there.")   Current Medications  Current facility-administered medications:0.9 %  sodium chloride infusion, 250 mL, Intravenous, PRN, Barton Dubois, MD;  acetaminophen (TYLENOL) tablet 650 mg, 650 mg, Oral, TID, Delfina Redwood, MD, 650 mg at 02/09/14 1016;  artificial tears (LACRILUBE) ophthalmic ointment, , Both Eyes, Q4H PRN, Barton Dubois, MD;  bisacodyl (DULCOLAX) suppository 10 mg, 10 mg, Rectal, Daily PRN, Delfina Redwood, MD cloNIDine (CATAPRES - Dosed in mg/24 hr) patch 0.3 mg, 0.3 mg, Transdermal, Q7 days, Barton Dubois, MD;  ferrous sulfate tablet 325 mg, 325 mg, Oral, BID WC, Barton Dubois, MD, 325 mg at 02/09/14 0626;  fluticasone (FLONASE) 50 MCG/ACT nasal spray 2 spray, 2 spray, Each Nare, Daily, Barton Dubois, MD, 2 spray at 02/09/14 1016;  insulin aspart (novoLOG) injection 0-9 Units, 0-9 Units, Subcutaneous, TID WC, Barton Dubois, MD, 3 Units at 02/09/14 0631 insulin glargine (LANTUS) injection 10 Units, 10 Units, Subcutaneous, Daily, Delfina Redwood, MD, 10 Units at 02/09/14 1016;  labetalol (NORMODYNE) tablet 400 mg, 400 mg, Oral, BID, Barton Dubois, MD, 400 mg at 02/09/14 1018;  loratadine (CLARITIN) tablet 10 mg, 10 mg, Oral, Daily, Barton Dubois, MD, 10 mg at 02/09/14 1018;  morphine 2 MG/ML injection 1 mg, 1 mg, Intravenous, Q3H PRN, Barton Dubois, MD, 1 mg at 02/09/14 0843 ondansetron (ZOFRAN) injection 4 mg, 4 mg, Intravenous, Q6H PRN, Barton Dubois, MD;  ondansetron [facility]) tablet 4 mg, 4 mg, Oral, Q6H PRN, Barton Dubois, MD;  oxyCODONE (Oxy IR/ROXICODONE) immediate release tablet 5  mg, 5 mg, Oral, Q4H PRN, Barton Dubois, MD, 5 mg at 02/08/14 1132;  pantoprazole (PROTONIX) EC tablet 40 mg, 40 mg, Oral, Daily, Barton Dubois, MD, 40 mg at 02/09/14 1016 polyethylene glycol (MIRALAX / GLYCOLAX) packet 17 g, 17 g, Oral, BID, Delfina Redwood, MD, 17 g at 02/09/14 1017;  senna (SENOKOT) tablet 17.2 mg, 2 tablet, Oral, Daily, Delfina Redwood, MD, 17.2 mg at 02/09/14 1017;  simvastatin (ZOCOR) tablet 10 mg, 10 mg, Oral, q1800, Barton Dubois, MD, 10 mg at 02/08/14 1759;  sodium chloride 0.9 % injection 3 mL, 3 mL, Intravenous, Q12H, Barton Dubois, MD, 3 mL at 02/07/14 2200 sodium chloride 0.9 % injection 3 mL, 3 mL, Intravenous, Q12H, Barton Dubois, MD, 3 mL at 02/09/14 1018;  sodium chloride 0.9 % injection 3 mL, 3 mL, Intravenous,  PRN, Barton Dubois, MD;  sodium phosphate (FLEET) 7-19 GM/118ML enema 1 enema, 1 enema, Rectal, Daily PRN, Delfina Redwood, MD;  Warfarin - Pharmacist Dosing Inpatient, , Does not apply, q1800, Sindy Guadeloupe, RPH  Patients Current Diet:  heart healthy, carb modified  Precautions / Restrictions Precautions: note pt is deaf in right ear and hard of hearing in left ear Precautions: Fall Precaution Comments: watch BP Restrictions Weight Bearing Restrictions: No   Prior Activity Level Household: for the past 6 months, pt has been mainly staying at home and only getting out to MD appointments. She was getting out less and less due to the bad winter weather and health issues "I just didn't feel good."  Home Assistive Devices / [place] Devices/Equipment: Dentures (specify type);Eyeglasses;Walker (specify type);CBG Meter;Shower chair with back;Grab bars in shower  Prior Functional Level Prior Function Level of Independence: Independent with assistive device(s) Comments: Used RW  Current Functional Level Cognition  Overall Cognitive Status: Impaired/Different from baseline Orientation Level: Oriented X4 Safety/Judgement:  Decreased awareness of safety (unsafely trying to sit) General Comments: Increased confusion after being up to [facility]    Extremity Assessment (includes Sensation/Coordination)          ADLs  Overall ADL's : Needs assistance/impaired Eating/Feeding: Set up Grooming: Minimal assistance Upper Body Bathing: Moderate assistance;Sitting Lower Body Bathing: Maximal assistance;Sit to/from stand Upper Body Dressing : Moderate assistance;Sitting Lower Body Dressing: Maximal assistance;Sit to/from stand Toilet Transfer: Stand-pivot;+2 for physical assistance;Moderate assistance Toilet Transfer Details (indicate cue type and reason): vc for safety Toileting- Clothing Manipulation and Hygiene: Minimal assistance Toileting - Clothing Manipulation Details (indicate cue type and reason): Pt able to wah her bottom. assistance to hold goen only Functional mobility during ADLs: Moderate assistance;+2 for physical assistance;Rolling walker General ADL Comments: Improvement today    Mobility  Overal bed mobility: Needs Assistance Bed Mobility: Sit to Supine Rolling: Min assist Sidelying to sit: Mod assist Sit to supine: Min assist General bed mobility comments: Needed assist for LES    Transfers  Overall transfer level: Needs assistance Equipment used: Rolling walker (2 wheeled) Transfers: Sit to/from Stand Sit to Stand: Mod assist;+2 physical assistance Stand pivot transfers: Mod assist;+2 physical assistance General transfer comment: Needed steadying assist initially.  Pt also needs a minute for BP issues.      Ambulation / Gait / Stairs / Wheelchair Mobility  Ambulation/Gait Ambulation/Gait assistance: Min assist;+2 physical assistance Ambulation Distance (Feet): 15 Feet Assistive device: Rolling walker (2 wheeled) Gait Pattern/deviations: Step-to pattern;Decreased stride length;Antalgic Gait velocity interpretation: Below normal speed for age/gender General Gait Details: Needed assist to  steer RW as well as guidance as to path.  Pt needs encouragement to continue.  Standing fully upright with RW but did c/o dizzines that was slight.  Pt's left LE was externally rotated.  Family states it has gotten progressively worse for some time now.     Note: acute PT made recommendations for possible further work up for vestibular issues.   Posture / Balance      Special needs/care consideration BiPAP/CPAP no  CPM no  Continuous Drip IV no Dialysis no         Life Vest no  Oxygen no  Special Bed no  Trach Size no  Wound Vac (area) no       Skin - tiny abrasion near right lateral elbow but no overt bruising/scrapes from recent fall per pt/dtr.  Bowel mgmt: last BM on 02-06-14 Bladder mgmt: using bedpan or BSC Diabetic mgmt - yes, managed at home with medications and insulin    Previous Home Environment Living Arrangements: [place]: No Additional Comments: Lives with son. Per pt, "my son lives with me."  Other family members available who [place]".  Discharge Living Setting Plans for Discharge Living Setting: Patient's home Type of Home at Discharge: House Discharge Home Layout: Two level;Able to live on main level with bedroom/bathroom ("old farmhouse") Alternate Level Stairs-Rails:  (pt does not go upstairs) Discharge Home Access: Stairs to enter Entrance Stairs-Rails: None Entrance Stairs-Number of Steps: 2 (one step to porch, one step to house) Does the patient have any problems obtaining your medications?: No  Social/Family/Support Systems Contact Information: dtr [name] is primary contact Anticipated Caregiver: son and other daughters (5 children live on farm, but son lives with pt) Anticipated Caregiver's Contact Information: see above Ability/Limitations of Caregiver: no limitations, son just recently retired from [employer] but does do farming work (5 children total live on plots around the family farm  and can share caregiving needs) Caregiver Availability: 24/7 Discharge Plan Discussed with Primary Caregiver: Yes (discussed with Dtr [name]) Is Caregiver In Agreement with Plan?: Yes Does Caregiver/Family have Issues with Lodging/Transportation while Pt is in Rehab?: No  Goals/Additional Needs Patient/Family Goal for Rehab: supervision and modified independence with PT and OT, NA for SLP Expected length of stay: 12-16 days` Cultural Considerations: Christian, enjoys attending [employer] when she felt well Dietary Needs: heart healthy, carb modified Equipment Needs: to be determined Pt/Family Agrees to Admission and willing to participate: Yes (spoke with pt and dtr [name] on 6-11) Program Orientation Provided & Reviewed with Pt/Caregiver Including Roles  & Responsibilities: Yes   Decrease burden of Care through IP rehab admission: NA  Possible need for SNF placement upon discharge: not anticipated  Patient Condition: This patient's condition remains as documented in the consult dated 02-09-14, in which the Rehabilitation Physician determined and documented that the patient's condition is appropriate for intensive rehabilitative care in an inpatient rehabilitation facility. Will admit to inpatient rehab today.  Preadmission Screen Completed By:  Nanetta Batty, PT 02/09/2014 10:34 AM ______________________________________________________________________   Discussed status with Dr. Letta Pate on 02-09-14 at 1045 and received telephone approval for admission today.  Admission Coordinator:  Nanetta Batty, PT time 1045/Date 02-09-14

## 2014-02-09 NOTE — Discharge Summary (Signed)
Physician Discharge Summary  Lindsay Sheppard IDP:[id number] DOB: 1921-02-01 DOA: 02/07/2014  PCP: [name], MD  Admit date: 02/07/2014 Discharge date: 02/09/2014  Time spent: greater than 30 min  Recommendations for Outpatient Follow-up:  1. See discharge instructions below 2. 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 female with PMH of HTN, diabetes, HLD, B12 deficiency, CKD stage 3; recurrent UTI (on suppression therapy with bactrim); atrial fibrillation ( on coumadin), GERD and anemia; came to ED after experiencing a falling episode at home. Patient reports severe back pain. Patient is unsure if she pass out or just slipped. Had hx in the past of orthostatic hypotension and syncope. Denies CP, fever, chills, nausea, vomiting, abd pain, HA's or focal motor deficit. In ED she was found to have mild hyperkalemia and severe back pain; CT thorax demonstrated 7 compression fractures. Given concerns for syncope and new deconditioning [facility] has been called to admit patient for further evaluation and treatment.   Hospital Course:     Fall:  Doubt syncope. Patient reports having lost her balance while getting up from the commode. Remained in normal sinus rhythm while on telemetry without further syncopal episodes. Should she continue to be a fall risk while being evaluated at inpatient rehabilitation, may need to stop Coumadin.    DIABETES  MELLITUS, TYPE II: Lantus dose increased for blood glucoses in the 200s. Metformin stopped due to chronic kidney disease. Will need monitoring of blood glucose and adjustment of Lantus insulin if needed.    B12 DEFICIENCY    HYPERTENSION    Atrial fibrillation: remains in NSR.  Coumadin supratherapeutic on admission. Has been held. Given vitamin K 2.5 mg day of discharge. Needs continued monitoring. Consider stopping if remains fall risk    DIASTOLIC HEART FAILURE, CHRONIC    GERD    Hyperkalemia corrected with kayexalate    Compression fracture: Patient had multiple seen on imaging. Clinically, only has tenderness along the mid T-spine, so suspect most of these are old.    Chronic constipation:  Takes MiraLax daily at home. 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    Supratherapeutic INR: See above    Recurrent UTI: Patient was on Bactrim daily for suppression therapy. Pharmacy voiced concerns about the Bactrim and Coumadin. With chronic kidney disease and advanced age, I am hesitant to start nitrofurantoin at this point. Would continue Bactrim and monitor INR closely, deferred to primary care provider whether to continue or not.   Procedures:  None  Consultations:  Physical medicine and rehabilitation  Discharge Exam: Filed Vitals:   02/09/14 0358  BP: 140/85  Pulse: 90  Temp: 97.7 F (36.5 C)  Resp: 18    General: In chair. Cardiovascular: Regular rate and rhythm without murmurs gallops rubs Respiratory: Clear to auscultation  bilaterally without wheezes rhonchi or rales Extremities no clubbing cyanosis or edema Abdomen soft nontender nondistended  Discharge Instructions   Diet - low sodium heart healthy    Complete by:  As directed      Diet Carb Modified    Complete by:  As directed      Discharge instructions    Complete by:  As directed   INR daily until less  than 3.0. Then per pharmacy/coumadin protocol.  monitor her blood glucose at least daily.      Walk with assistance    Complete by:  As directed             Medication List    STOP taking these medications       furosemide 20 MG tablet  Commonly known as:  LASIX     metFORMIN 500 MG tablet  Commonly known as:  GLUCOPHAGE      TAKE these medications       acetaminophen 325 MG tablet  Commonly known as:  TYLENOL  Take 2 tablets (650 mg total) by mouth 3 (three) times daily.     ARTIFICIAL TEARS OP  Place 1 drop into both eyes daily.     bisacodyl 10 MG suppository  Commonly known as:  DULCOLAX  Place 1 suppository (10 mg total) rectally daily as needed for moderate constipation.     cloNIDine 0.3 mg/24hr patch  Commonly known as:  CATAPRES - Dosed in mg/24 hr  Place 1 patch (0.3 mg total) onto the skin every 7 (seven) days.     ferrous sulfate 325 (65 FE) MG tablet  Take 1 tablet (325 mg total) by mouth daily with breakfast.     fexofenadine 180 MG tablet  Commonly known as:  ALLEGRA  Take 180 mg by mouth every evening.     fluticasone 50 MCG/ACT nasal spray  Commonly known as:  FLONASE  Place 2 sprays into the nose daily.     insulin glargine 100 UNIT/ML injection  Commonly known as:  LANTUS  Inject 0.1 mLs (10 Units total) into the skin daily. Inject 7 units subcutaneously once a day     labetalol 200 MG tablet  Commonly known as:  NORMODYNE  Take 400 mg by mouth 2 (two) times daily.     oxyCODONE 5 MG immediate release tablet  Commonly known as:  Oxy IR/ROXICODONE  Take 1 tablet (5 mg total) by mouth every 4 (four) hours as needed for moderate pain.     pantoprazole 40 MG tablet  Commonly known as:  PROTONIX  Take 1 tablet (40 mg total) by mouth daily.     polyethylene glycol packet  Commonly known as:  MIRALAX / GLYCOLAX  Take 17 g by mouth daily.     pravastatin 40 MG tablet  Commonly known as:  PRAVACHOL  Take 0.5 tablets (20 mg total) by mouth  daily. 1/2 daily     senna 8.6 MG Tabs tablet  Commonly known as:  SENOKOT  Take 2 tablets (17.2 mg total) by mouth daily.     sodium phosphate 7-19 GM/118ML Enem  Place 133 mLs (1 enema total) rectally daily as needed for severe constipation.     sulfamethoxazole-trimethoprim 800-160 MG per tablet  Commonly known as:  BACTRIM DS,SEPTRA DS  Take 1 tablet by mouth daily.     VITAMIN B-12 IJ  Inject as directed every 30 (thirty) days.     warfarin 1 MG tablet  Commonly known as:  COUMADIN  Hold until INR less than 3.0, then resume at 1 mg daily or per pharmacy       Allergies  Allergen Reactions  . Amiodarone     REACTION: f? fluid retention  . Deltasone [Prednisone]   . Diazepam     REACTION: agitation  . Diltiazem Hcl   . Levofloxacin     REACTION: nausea  . Pregabalin     REACTION: hallucinations  . Spironolactone     REACTION: elevated K at lowest dose  . Tequin [Gatifloxacin]       The results of significant diagnostics from this hospitalization (including imaging, microbiology, ancillary and laboratory) are listed below for reference.    Significant Diagnostic Studies: Dg Chest 1 View  02/07/2014   CLINICAL DATA:  Back and chest pain secondary to a fall.  EXAM: CHEST - 1 VIEW  COMPARISON:  10/04/2013  FINDINGS: Heart size and pulmonary vascularity are normal. There is slight chronic could peribronchial thickening. No infiltrates or effusions. No definitive acute osseous abnormality. There are compression deformities at multiple levels in the thoracic spine these appear unchanged since the prior study.  IMPRESSION: No acute abnormalities.   Electronically Signed   By: Rozetta Nunnery M.D.   On: 02/07/2014 09:17   Ct Head Wo Contrast  02/07/2014   CLINICAL DATA:  Neck and back pain secondary to a fall this morning.  EXAM: CT HEAD WITHOUT CONTRAST  CT CERVICAL SPINE WITHOUT CONTRAST  TECHNIQUE: Multidetector CT imaging of the head and cervical spine was performed following  the standard protocol without intravenous contrast. Multiplanar CT image reconstructions of the cervical spine were also generated.  COMPARISON:  CT scan of the head dated 07/15/2008  FINDINGS: CT HEAD FINDINGS  No mass lesion. No midline shift. No acute hemorrhage or hematoma. No extra-axial fluid collections. No evidence of acute infarction. There is mild diffuse cerebral cortical atrophy without ventricular dilatation. No osseous abnormality.  CT CERVICAL SPINE FINDINGS  There is no acute fracture or prevertebral soft tissue swelling. There is a 1.5 mm spondylolisthesis of C6 on C7 due to moderate left facet arthritis. There is narrowing of the C5-6 disc space. There is a small central disc protrusion at C3-4 with no neural impingement. There is multilevel moderately severe left facet arthritis from C3-4 through C6-7. The C2-3 level is fused.  IMPRESSION: 1. No acute intracranial abnormality. 2. No acute abnormality of the cervical spine.   Electronically Signed   By: Rozetta Nunnery M.D.   On: 02/07/2014 10:09   Ct Cervical Spine Wo Contrast  02/07/2014   CLINICAL DATA:  Neck and back pain secondary to a fall this morning.  EXAM: CT HEAD WITHOUT CONTRAST  CT CERVICAL SPINE WITHOUT CONTRAST  TECHNIQUE: Multidetector CT imaging of the head and cervical spine was performed following the standard protocol without intravenous contrast. Multiplanar CT image reconstructions of the cervical spine were also generated.  COMPARISON:  CT scan of the head dated 07/15/2008  FINDINGS: CT HEAD FINDINGS  No mass lesion. No midline shift. No acute hemorrhage or hematoma. No extra-axial fluid collections. No evidence of acute infarction. There is mild diffuse cerebral cortical atrophy without ventricular dilatation. No osseous abnormality.  CT CERVICAL SPINE FINDINGS  There is no acute fracture or prevertebral soft tissue swelling. There is a 1.5 mm spondylolisthesis of C6 on C7 due to moderate left facet arthritis. There is  narrowing of the C5-6 disc space. There is a small central disc protrusion at C3-4 with  no neural impingement. There is multilevel moderately severe left facet arthritis from C3-4 through C6-7. The C2-3 level is fused.  IMPRESSION: 1. No acute intracranial abnormality. 2. No acute abnormality of the cervical spine.   Electronically Signed   By: Rozetta Nunnery M.D.   On: 02/07/2014 10:09   Ct Thoracic Spine Wo Contrast  02/07/2014   CLINICAL DATA:  80 year old woman following fall. Back pain. Spinal tenderness at the neck and radiating through the lumbar spine.  EXAM: CT THORACIC AND LUMBAR SPINE WITHOUT CONTRAST  TECHNIQUE: Multidetector CT imaging of the thoracic and lumbar spine was performed without contrast. Multiplanar CT image reconstructions were also generated.  COMPARISON:  Cervical spine CT 02/07/2014.  Abdominal CT 01/28/2008.  FINDINGS: CT THORACIC SPINE FINDINGS  Cervical rib its are present bilaterally at C7. Numbering was performed contiguously from the craniocervical junction on prior cervical spine CT and the cervical ribs as references. In the lumbar spine, there are 5 non-rib-bearing lumbar type vertebral bodies with rudimentary ribs at T12. Sacralization of the transverse processes of L5. Levoconvex curve of the thoracolumbar spine with the apex at T12. Multilevel compression fractures are present. Paraspinal soft tissues demonstrate a 16 mm left thyroid lobe cystic lesion. Atherosclerosis is present including coronary artery disease. Lungs show extensive calcification of the tracheobronchial tree. Small hiatal hernia incidentally noted.  No gross hematomas identified in the central canal. There is no definite thoracic paravertebral phlegmon to suggest an acute compression fracture. Mild T2 superior endplate compression fracture. T3 a also shows a superior endplate compression fracture. T4 and T5 show preserved height and there appears to be ossification across the disc space and ankylosis. T6  shows a superior endplate compression fracture with 20% loss height and no retropulsion. T7 shows 25% loss of vertebral body height. T8 is preserved. T9 shows about 10% loss of vertebral body height without retropulsion. T10 is intact. T11 shows a superior endplate compression fracture with 25% loss of vertebral body height and mild retropulsion. T12 shows a biconcave compression fracture with 25% loss of vertebral body height but no retropulsion.  Osseous ridging is present throughout the thoracolumbar spine along with some calcified disc protrusions. No critical central stenosis is identified.  CT LUMBAR SPINE FINDINGS  Thoracic spinal alignment appears unchanged compared MRI 03/08/2012. Numbering convention used on the prior MRI is preserved. Sacral irregularity is present best seen on the sagittal images, which is a chronic finding seen on the MRI 03/18/2012.  Bilateral SI joint degenerative disease is present. Sclerotic lesion present in the left sacral ala appears new compared to CT 2009. This measures 8 mm and is indeterminate.  T12 biconcave compression fracture appears similar to prior MRI. L2 superior endplate compression fracture with retropulsion also appears unchanged. L3 shows preserved vertebral body height. L4 and L5 also show preserved vertebral body height without an acute fracture. Severe L4-L5 degenerative disc disease. Allowing for differences in technique, there is little if any interval change compared to prior MRI. Paraspinal soft tissues demonstrate dense aortoiliac and visceral atherosclerosis. Unchanged nodule in the left adrenal gland which may represent a large adenoma or myelolipoma.  IMPRESSION: CT THORACIC SPINE IMPRESSION  1. 16 mm left thyroid lobe cystic lesion. Consider further evaluation with thyroid ultrasound. If patient is clinically hyperthyroid, consider nuclear medicine thyroid uptake and scan. 2. Multilevel compression fractures detailed above. No definite paravertebral  phlegmon to suggest an acute thoracic compression fracture. 3. Because of the multiplicity of thoracic compression fractures, MRI of the thoracic and  lumbar spine should be considered to assess for bone marrow edema.  CT LUMBAR SPINE IMPRESSION  1. Indeterminate 8 mm sclerotic lesion in the left sacral ala, new compared to 2009. 2. Old sacral fracture demonstrated on prior MRI. 3. Chronic L1 and L2 compression fractures with retropulsion of L2. No definite acute osseous abnormality in the lumbar spine compared to prior MRI 03/18/2012. Moderate central stenosis associated with retropulsion of L2.   Electronically Signed   By: Dereck Ligas M.D.   On: 02/07/2014 10:47   Ct Lumbar Spine Wo Contrast  02/07/2014   CLINICAL DATA:  80 year old woman following fall. Back pain. Spinal tenderness at the neck and radiating through the lumbar spine.  EXAM: CT THORACIC AND LUMBAR SPINE WITHOUT CONTRAST  TECHNIQUE: Multidetector CT imaging of the thoracic and lumbar spine was performed without contrast. Multiplanar CT image reconstructions were also generated.  COMPARISON:  Cervical spine CT 02/07/2014.  Abdominal CT 01/28/2008.  FINDINGS: CT THORACIC SPINE FINDINGS  Cervical rib its are present bilaterally at C7. Numbering was performed contiguously from the craniocervical junction on prior cervical spine CT and the cervical ribs as references. In the lumbar spine, there are 5 non-rib-bearing lumbar type vertebral bodies with rudimentary ribs at T12. Sacralization of the transverse processes of L5. Levoconvex curve of the thoracolumbar spine with the apex at T12. Multilevel compression fractures are present. Paraspinal soft tissues demonstrate a 16 mm left thyroid lobe cystic lesion. Atherosclerosis is present including coronary artery disease. Lungs show extensive calcification of the tracheobronchial tree. Small hiatal hernia incidentally noted.  No gross hematomas identified in the central canal. There is no definite  thoracic paravertebral phlegmon to suggest an acute compression fracture. Mild T2 superior endplate compression fracture. T3 a also shows a superior endplate compression fracture. T4 and T5 show preserved height and there appears to be ossification across the disc space and ankylosis. T6 shows a superior endplate compression fracture with 20% loss height and no retropulsion. T7 shows 25% loss of vertebral body height. T8 is preserved. T9 shows about 10% loss of vertebral body height without retropulsion. T10 is intact. T11 shows a superior endplate compression fracture with 25% loss of vertebral body height and mild retropulsion. T12 shows a biconcave compression fracture with 25% loss of vertebral body height but no retropulsion.  Osseous ridging is present throughout the thoracolumbar spine along with some calcified disc protrusions. No critical central stenosis is identified.  CT LUMBAR SPINE FINDINGS  Thoracic spinal alignment appears unchanged compared MRI 03/08/2012. Numbering convention used on the prior MRI is preserved. Sacral irregularity is present best seen on the sagittal images, which is a chronic finding seen on the MRI 03/18/2012.  Bilateral SI joint degenerative disease is present. Sclerotic lesion present in the left sacral ala appears new compared to CT 2009. This measures 8 mm and is indeterminate.  T12 biconcave compression fracture appears similar to prior MRI. L2 superior endplate compression fracture with retropulsion also appears unchanged. L3 shows preserved vertebral body height. L4 and L5 also show preserved vertebral body height without an acute fracture. Severe L4-L5 degenerative disc disease. Allowing for differences in technique, there is little if any interval change compared to prior MRI. Paraspinal soft tissues demonstrate dense aortoiliac and visceral atherosclerosis. Unchanged nodule in the left adrenal gland which may represent a large adenoma or myelolipoma.  IMPRESSION: CT  THORACIC SPINE IMPRESSION  1. 16 mm left thyroid lobe cystic lesion. Consider further evaluation with thyroid ultrasound. If patient is clinically hyperthyroid,  consider nuclear medicine thyroid uptake and scan. 2. Multilevel compression fractures detailed above. No definite paravertebral phlegmon to suggest an acute thoracic compression fracture. 3. Because of the multiplicity of thoracic compression fractures, MRI of the thoracic and lumbar spine should be considered to assess for bone marrow edema.  CT LUMBAR SPINE IMPRESSION  1. Indeterminate 8 mm sclerotic lesion in the left sacral ala, new compared to 2009. 2. Old sacral fracture demonstrated on prior MRI. 3. Chronic L1 and L2 compression fractures with retropulsion of L2. No definite acute osseous abnormality in the lumbar spine compared to prior MRI 03/18/2012. Moderate central stenosis associated with retropulsion of L2.   Electronically Signed   By: Dereck Ligas M.D.   On: 02/07/2014 10:47   EKG Sinus tachycardia Right bundle branch block Left anterior fasicular block  Echo Left ventricle: The cavity size was normal. Wall thickness was increased in a pattern of moderate LVH. Systolic function was normal. The estimated ejection fraction was in the range of 50% to 55%. Doppler parameters are consistent with elevated ventricular end-diastolic filling pressure. - Mitral valve: There was mild regurgitation. - Left atrium: The atrium was mildly dilated. - Right ventricle: The cavity size was mildly dilated. - Right atrium: The atrium was mildly dilated. - Atrial septum: No defect or patent foramen ovale was identified. - Pulmonary arteries: PA peak pressure: 60 mm Hg (S).    Microbiology: Recent Results (from the past 240 hour(s))  URINE CULTURE     Status: None   Collection Time    02/07/14  8:45 AM      Result Value Ref Range Status   Specimen Description URINE, CATHETERIZED   Final   Special Requests Normal   Final   Culture   Setup Time     Final   Value: 02/07/2014 11:15     Performed at [place]     Final   Value: NO GROWTH     Performed at [facility]   Culture     Final   Value: NO GROWTH     Performed at [facility]   Report Status 02/08/2014 FINAL   Final     Labs: Basic Metabolic Panel:  Recent Labs Lab 02/07/14 0825 02/07/14 1706 02/09/14 0427  NA 137  --  136*  K 5.5*  --  4.9  CL 102  --  99  CO2 21  --  22  GLUCOSE 161*  --  201*  BUN 38*  --  43*  CREATININE 1.38*  --  1.69*  CALCIUM 10.3  --  9.2  MG  --  1.7  --   PHOS  --  3.0  --    Liver Function Tests:  Recent Labs Lab 02/07/14 0825  AST 22  ALT 18  ALKPHOS 66  BILITOT 0.3  PROT 6.7  ALBUMIN 3.9   No results found for this basename: LIPASE, AMYLASE,  in the last 168 hours No results found for this basename: AMMONIA,  in the last 168 hours CBC:  Recent Labs Lab 02/07/14 0825  WBC 6.9  NEUTROABS 6.3  HGB 12.8  HCT 40.5  MCV 89.8  PLT 146*   Cardiac Enzymes:  Recent Labs Lab 02/07/14 0825 02/07/14 1706 02/07/14 2244 02/08/14 0420  TROPONINI <0.30 <0.30 <0.30 <0.30   BNP: BNP (last 3 results) No results found for this basename: PROBNP,  in the last 8760 hours CBG:  Recent Labs Lab 02/08/14 0659  02/08/14 1118 02/08/14 1616 02/08/14 2123 02/09/14 0627  GLUCAP 201* 240* 228* 249* 212*       Signed:  Jaris Kohles L  [facility] 02/09/2014, 10:50 AM

## 2014-02-09 NOTE — Telephone Encounter (Signed)
Called back to [facility] and informed of MD instructions on antibiotic.

## 2014-02-09 NOTE — Progress Notes (Signed)
Physical Therapy Treatment Patient Details Name: RIVKAH WOLZ MRN: [id number] DOB: Jun 25, 1921 Today's Date: 02/09/2014    History of Present Illness  80 y.o. female with PMH of HTN, diabetes, HLD, B12 deficiency, CKD stage 3; recurrent UTI (on suppression therapy with bactrim); atrial fibrillation ( on coumadin), GERD and anemia; came to ED after experiencing a falling episode at home. Patient reports severe back pain. Patient is unsure if she pass out or just slipped. Had hx in the past of orthostatic hypotension and syncope. In ED she was found to have mild hyperkalemia and severe back pain; CT thorax demonstrated 7 compression fractures    PT Comments    Pt admitted with above. Pt currently with functional limitations due to the deficits listed below (see PT Problem List).  Pt will benefit from skilled PT to increase their independence and safety with mobility to allow discharge to the venue listed below.    Follow Up Recommendations  Supervision/Assistance - 24 hour;CIR     Equipment Recommendations  None recommended by PT    Recommendations for Other Services       Precautions / Restrictions Precautions Precautions: Fall Restrictions Weight Bearing Restrictions: No    Mobility  Bed Mobility Overal bed mobility: Needs Assistance Bed Mobility: Supine to Sit Rolling: Min assist Sidelying to sit: Mod assist Supine to sit: Mod assist     General bed mobility comments: Needed assist for LES and trunk.  Tested for BPPV with positive right [employer].  Treated for Right BPPV via Epley maneuver.  Pt reports feeling a little better once in chair after treatment.     Transfers Overall transfer level: Needs assistance Equipment used: Rolling walker (2 wheeled) Transfers: Sit to/from [facility] Sit to Stand: Mod assist;+2 physical assistance Stand pivot transfers: Mod assist;+2 physical assistance       General transfer comment: Needs steadying assist.    Ambulation/Gait                 Stairs            Wheelchair Mobility    Modified Rankin (Stroke Patients Only)       Balance Overall balance assessment: Needs assistance;History of Falls Sitting-balance support: Bilateral upper extremity supported;Feet supported Sitting balance-Leahy Scale: Poor Sitting balance - Comments: Needed steadying assist to sit EOB.    Standing balance support: Bilateral upper extremity supported;During functional activity Standing balance-Leahy Scale: Poor Standing balance comment: Needs steadying assist with RW.  Cannot stand without UE support.                      Cognition Arousal/Alertness: Awake/alert Behavior During Therapy: Anxious Overall Cognitive Status: Impaired/Different from baseline Area of Impairment: Problem solving;Safety/judgement     Memory: Decreased short-term memory   Safety/Judgement: Decreased awareness of safety   Problem Solving: Decreased initiation;Slow processing;Difficulty sequencing      Exercises      General Comments General comments (skin integrity, edema, etc.): Today, testing revealed right BPPV therefore pt treated for that.  Going to Rehab today.        Pertinent Vitals/Pain VSS, some back pain with rolling.  Had meds by nursing.     Home Living                      Prior Function            PT Goals (current goals can now be found in the care plan section)  Progress towards PT goals: Progressing toward goals    Frequency  Min 3X/week    PT Plan Current plan remains appropriate    Co-evaluation             End of Session Equipment Utilized During Treatment: Gait belt Activity Tolerance: Patient limited by fatigue Patient left: in chair;with call bell/phone within reach;with family/visitor present     Time: 5072-2575 PT Time Calculation (min): 24 min  Charges:  $Therapeutic Activity: 8-22 mins $[place] Rep Proc: 8-22 mins                     G Codes:      INGOLD,Lonie Newsham Mar 11, 2014, 12:13 PM [facility] [phone] (pager)

## 2014-02-09 NOTE — Progress Notes (Signed)
Physical Medicine and Rehabilitation Consult Reason for Consult: Chronic lumbar thoracic compression fractures Referring Physician: [facility]     HPI: Lindsay Sheppard is a 80 y.o. right-handed female with history of hypertension, diabetes mellitus with peripheral neuropathy, chronic atrial fibrillation on Coumadin therapy and chronic renal insufficiency with baseline creatinine 1.7. Patient independent with rolling walker prior to admission living with her son. Presented 02/07/2014 after a fall. Cranial CT scan negative for acute changes. Cardiac enzymes negative. Patient with noted severe back pain. X-ray imaging CT lumbar thoracic spine with old sacral fracture, chronic L1 and L2 compression fractures with retropulsion of L2, chronic multilevel compression fractures with no acute thoracic compression fractures noted. Hospital course pain management with conservative care. Patient remains on chronic Coumadin therapy. Echocardiogram with ejection fraction of 55% and normal systolic function. Physical therapy evaluation completed 02/08/2014 with recommendations for physical medicine rehabilitation consult.     Review of Systems  Constitutional: Positive for malaise/fatigue.  Cardiovascular: Positive for palpitations.  Gastrointestinal: Positive for constipation.        GERD  Genitourinary: Positive for dysuria.  Musculoskeletal: Positive for back pain and falls.  Neurological: Positive for dizziness.  All other systems reviewed and are negative. Past Medical History   Diagnosis  Date   .  POSTHERPETIC NEURALGIA  11/02/2009   .  B12 DEFICIENCY  05/23/2007   .  HYPERLIPIDEMIA  05/23/2007   .  HYPERKALEMIA  05/30/2010   .  ANEMIA-IRON DEFICIENCY  01/26/2007   .  BLEPHARITIS, BILATERAL  02/23/2008   .  Other specified forms of hearing loss  05/30/2010   .  HYPERTENSION  01/26/2007   .  Atrial fibrillation  05/23/2007   .  DIASTOLIC HEART FAILURE, CHRONIC  12/14/2009   .  POSTURAL HYPOTENSION  02/07/2008    .  ALLERGIC RHINITIS  11/02/2009   .  GERD  01/26/2007   .  CONSTIPATION  01/25/2008   .  SKIN LESION  05/30/2010   .  BACK PAIN  08/02/2007   .  OSTEOPOROSIS  05/23/2007   .  SYNCOPE  02/07/2008   .  FATIGUE  09/06/2007   .  Dysuria  09/05/2008   .  Abdominal pain, epigastric  09/06/2007   .  EPIGASTRIC TENDERNESS  10/12/2007   .  PULMONARY EMBOLISM, HX OF  05/23/2007   .  SHINGLES, HX OF  05/23/2007   .  Optic nerve hemorrhage  12/23/2010   .  Left foot pain     .  Diabetic neuropathy     .  RENAL INSUFFICIENCY  07/02/2009   .  RENAL CYST, LEFT  09/06/2007   .  Kidney stones         "never had OR"   .  Family history of anesthesia complication         "daughter has PONV; another daughter a little anesthesia lasts way too long"   .  CHF (congestive heart failure)  2011   .  Myocardial infarction  2011       "mild"   .  DVT (deep venous thrombosis)  ~ 1964       "think it was in the left"   .  Aspiration pneumonia  ~ 2006       "due to aspiration"   .  DIABETES MELLITUS, TYPE II  09/06/2007   .  Arthritis         "hands" (02/07/2014)   .  DEPRESSIVE DISORDER  01/25/2008       "@  times; never treated for it"   .  Melanoma of nose     .  Compression fracture of lumbar vertebra         hx   .  Compression fracture of thoracic vertebra  02/07/2014       "fell this am" (02/07/2014    Past Surgical History   Procedure  Laterality  Date   .  Appendectomy    1943   .  Abdominal hysterectomy    1964   .  Cardiac electrophysiology mapping and ablation    1999   .  Cataract extraction w/ intraocular lens  implant, bilateral    2003-2005   .  Carpal tunnel release  Right  2004   .  Mohs surgery    2011       "removed off her nose"   .  Cardiac catheterization    10/2009   .  Esophageal dilation    X 2    Family History   Problem  Relation  Age of Onset   .  Breast cancer  Mother     .  Colon cancer  Father     .  Stroke  Father         died with stroke postop   .  Diabetes  Sister         2 sisters    .  Hypertension  Other      Social History: reports that she has never smoked. She has never used smokeless tobacco. She reports that she does not drink alcohol or use illicit drugs. Allergies:   Allergies   Allergen  Reactions   .  Amiodarone         REACTION: f? fluid retention   .  Deltasone [Prednisone]     .  Diazepam         REACTION: agitation   .  Diltiazem Hcl     .  Levofloxacin         REACTION: nausea   .  Pregabalin         REACTION: hallucinations   .  Spironolactone         REACTION: elevated K at lowest dose   .  Tequin [Gatifloxacin]      Medications Prior to Admission   Medication  Sig  Dispense  Refill   .  cloNIDine (CATAPRES - DOSED IN MG/24 HR) 0.3 mg/24hr patch  Place 1 patch (0.3 mg total) onto the skin every 7 (seven) days.   12 patch   5   .  Cyanocobalamin (VITAMIN B-12 IJ)  Inject as directed every 30 (thirty) days.         .  ferrous sulfate 325 (65 FE) MG tablet  Take 325 mg by mouth 2 (two) times daily with a meal.          .  fexofenadine (ALLEGRA) 180 MG tablet  Take 180 mg by mouth every evening.          .  fluticasone (FLONASE) 50 MCG/ACT nasal spray  Place 2 sprays into the nose daily.   16 g   2   .  furosemide (LASIX) 20 MG tablet  Take every other day   90 tablet   3   .  Hypromellose (ARTIFICIAL TEARS OP)  Place 1 drop into both eyes daily.         .  insulin glargine (LANTUS) 100 UNIT/ML injection  Inject 7 units subcutaneously  once a day         .  labetalol (NORMODYNE) 200 MG tablet  Take 400 mg by mouth 2 (two) times daily.         .  metFORMIN (GLUCOPHAGE) 500 MG tablet  Take 1 tablet (500 mg total) by mouth 2 (two) times daily with a meal. 1 po twice per day   180 tablet   3   .  pantoprazole (PROTONIX) 40 MG tablet  Take 1 tablet (40 mg total) by mouth daily.   90 tablet   3   .  polyethylene glycol (MIRALAX / GLYCOLAX) packet  Take 17 g by mouth daily.         .  pravastatin (PRAVACHOL) 40 MG tablet  Take 0.5 tablets (20 mg total)  by mouth daily. 1/2 daily   45 tablet   3   .  sulfamethoxazole-trimethoprim (BACTRIM DS,SEPTRA DS) 800-160 MG per tablet  Take 1 tablet by mouth daily.   90 tablet   3   .  warfarin (COUMADIN) 2.5 MG tablet  Take 2.5 mg by mouth every evening.            Home: Home Living Family/patient expects to be discharged to:: Inpatient rehab Living Arrangements: Children Additional Comments: Lives with son. Other [place]   Functional History: Prior Function Level of Independence: Independent with assistive device(s) Comments: Used RW Functional Status:   Mobility: Bed Mobility Overal bed mobility: Needs Assistance Bed Mobility: Sit to Supine Rolling: Min assist Sidelying to sit: Mod assist Sit to supine: Min assist General bed mobility comments: Needed assist for LES Transfers Overall transfer level: Needs assistance Equipment used: Rolling walker (2 wheeled) Transfers: Sit to/from Stand Sit to Stand: Mod assist;+2 physical assistance Stand pivot transfers: Mod assist;+2 physical assistance General transfer comment: Needed steadying assist initially.  Pt also needs a minute for BP issues.   Ambulation/Gait Ambulation/Gait assistance: Min assist;+2 physical assistance Ambulation Distance (Feet): 15 Feet Assistive device: Rolling walker (2 wheeled) Gait Pattern/deviations: Step-to pattern;Decreased stride length;Antalgic Gait velocity interpretation: Below normal speed for age/gender General Gait Details: Needed assist to steer RW as well as guidance as to path.  Pt needs encouragement to continue.  Standing fully upright with RW but did c/o dizzines that was slight.  Pt's left LE was externally rotated.  Family states it has gotten progressively worse for some time now.     ADL: ADL Overall ADL's : Needs assistance/impaired Eating/Feeding: Set up Grooming: Minimal assistance Upper Body Bathing: Moderate assistance;Sitting Lower Body  Bathing: Maximal assistance;Sit to/from stand Upper Body Dressing : Moderate assistance;Sitting Lower Body Dressing: Maximal assistance;Sit to/from stand Toilet Transfer: Stand-pivot;+2 for physical assistance;Moderate assistance Toilet Transfer Details (indicate cue type and reason): vc for safety Toileting- Clothing Manipulation and Hygiene: Minimal assistance Toileting - Clothing Manipulation Details (indicate cue type and reason): Pt able to wah her bottom. assistance to hold goen only Functional mobility during ADLs: Moderate assistance;+2 for physical assistance;Rolling walker General ADL Comments: Improvement today   Cognition: Cognition Overall Cognitive Status: Impaired/Different from baseline Orientation Level: Oriented X4 Cognition Arousal/Alertness: Awake/alert Behavior During Therapy: Anxious Overall Cognitive Status: Impaired/Different from baseline Area of Impairment: Problem solving;Safety/judgement Memory: Decreased short-term memory Safety/Judgement: Decreased awareness of safety (unsafely trying to sit) Problem Solving: Decreased initiation;Slow processing;Difficulty sequencing General Comments: Increased confusion after being up to [facility]   Blood pressure 140/85, pulse 90, temperature 97.7 F (36.5 C), temperature source Axillary, resp. rate 18,  height 5\' 7"  (1.702 m), weight 53 kg (116 lb 13.5 oz), SpO2 98.00%. Physical Exam  Constitutional: She is oriented to person, place, and time.  HENT:   Head: Normocephalic.  Eyes: EOM are normal.  Neck: Normal range of motion. Neck supple. No thyromegaly present.  Cardiovascular:  Cardiac rate controlled  Respiratory: Effort normal and breath sounds normal. No respiratory distress.  GI: Soft. Bowel sounds are normal. She exhibits no distension.  Neurological: She is alert and oriented to person, place, and time.  Patient makes good eye contact with examiner. HOH. Reasonable insight and awareness. Moves all 4's but  proximal LE's limited by pain. UE's 5/5. HF 2+, KE 3+, ADF/APF 4/5. Stocking glove sensory loss to above the calves.   Skin: Skin is warm and dry.  Psychiatric: She has a normal mood and affect. Her behavior is normal. Thought content normal.     Results for orders placed during the hospital encounter of 02/07/14 (from the past 24 hour(s))   GLUCOSE, CAPILLARY     Status: Abnormal     Collection Time      02/08/14  6:59 AM       Result  Value  Ref Range     Glucose-Capillary  201 (*)  70 - 99 mg/dL     Comment 1  Documented in Chart        Comment 2  Notify RN      GLUCOSE, CAPILLARY     Status: Abnormal     Collection Time      02/08/14 11:18 AM       Result  Value  Ref Range     Glucose-Capillary  240 (*)  70 - 99 mg/dL   GLUCOSE, CAPILLARY     Status: Abnormal     Collection Time      02/08/14  4:16 PM       Result  Value  Ref Range     Glucose-Capillary  228 (*)  70 - 99 mg/dL     Comment 1  Documented in Chart        Comment 2  Notify RN      GLUCOSE, CAPILLARY     Status: Abnormal     Collection Time      02/08/14  9:23 PM       Result  Value  Ref Range     Glucose-Capillary  249 (*)  70 - 99 mg/dL   PROTIME-INR     Status: Abnormal     Collection Time      02/09/14  4:27 AM       Result  Value  Ref Range     Prothrombin Time  42.8 (*)  11.6 - 15.2 seconds     INR  4.76 (*)  0.00 - 7.82   BASIC METABOLIC PANEL     Status: Abnormal     Collection Time      02/09/14  4:27 AM       Result  Value  Ref Range     Sodium  136 (*)  137 - 147 mEq/L     Potassium  4.9   3.7 - 5.3 mEq/L     Chloride  99   96 - 112 mEq/L     CO2  22   19 - 32 mEq/L     Glucose, Bld  201 (*)  70 - 99 mg/dL     BUN  43 (*)  6 - 23 mg/dL  Creatinine, Ser  1.69 (*)  0.50 - 1.10 mg/dL     Calcium  9.2   8.4 - 10.5 mg/dL     GFR calc non Af Amer  26 (*)  >90 mL/min     GFR calc Af Amer  30 (*)  >90 mL/min    Dg Chest 1 View   02/07/2014   CLINICAL DATA:  Back and chest pain secondary to a  fall.  EXAM: CHEST - 1 VIEW  COMPARISON:  10/04/2013  FINDINGS: Heart size and pulmonary vascularity are normal. There is slight chronic could peribronchial thickening. No infiltrates or effusions. No definitive acute osseous abnormality. There are compression deformities at multiple levels in the thoracic spine these appear unchanged since the prior study.  IMPRESSION: No acute abnormalities.   Electronically Signed   By: Rozetta Nunnery M.D.   On: 02/07/2014 09:17    Ct Head Wo Contrast   02/07/2014   CLINICAL DATA:  Neck and back pain secondary to a fall this morning.  EXAM: CT HEAD WITHOUT CONTRAST  CT CERVICAL SPINE WITHOUT CONTRAST  TECHNIQUE: Multidetector CT imaging of the head and cervical spine was performed following the standard protocol without intravenous contrast. Multiplanar CT image reconstructions of the cervical spine were also generated.  COMPARISON:  CT scan of the head dated 07/15/2008  FINDINGS: CT HEAD FINDINGS  No mass lesion. No midline shift. No acute hemorrhage or hematoma. No extra-axial fluid collections. No evidence of acute infarction. There is mild diffuse cerebral cortical atrophy without ventricular dilatation. No osseous abnormality.  CT CERVICAL SPINE FINDINGS  There is no acute fracture or prevertebral soft tissue swelling. There is a 1.5 mm spondylolisthesis of C6 on C7 due to moderate left facet arthritis. There is narrowing of the C5-6 disc space. There is a small central disc protrusion at C3-4 with no neural impingement. There is multilevel moderately severe left facet arthritis from C3-4 through C6-7. The C2-3 level is fused.  IMPRESSION: 1. No acute intracranial abnormality. 2. No acute abnormality of the cervical spine.   Electronically Signed   By: Rozetta Nunnery M.D.   On: 02/07/2014 10:09    Ct Cervical Spine Wo Contrast   02/07/2014   CLINICAL DATA:  Neck and back pain secondary to a fall this morning.  EXAM: CT HEAD WITHOUT CONTRAST  CT CERVICAL SPINE WITHOUT  CONTRAST  TECHNIQUE: Multidetector CT imaging of the head and cervical spine was performed following the standard protocol without intravenous contrast. Multiplanar CT image reconstructions of the cervical spine were also generated.  COMPARISON:  CT scan of the head dated 07/15/2008  FINDINGS: CT HEAD FINDINGS  No mass lesion. No midline shift. No acute hemorrhage or hematoma. No extra-axial fluid collections. No evidence of acute infarction. There is mild diffuse cerebral cortical atrophy without ventricular dilatation. No osseous abnormality.  CT CERVICAL SPINE FINDINGS  There is no acute fracture or prevertebral soft tissue swelling. There is a 1.5 mm spondylolisthesis of C6 on C7 due to moderate left facet arthritis. There is narrowing of the C5-6 disc space. There is a small central disc protrusion at C3-4 with no neural impingement. There is multilevel moderately severe left facet arthritis from C3-4 through C6-7. The C2-3 level is fused.  IMPRESSION: 1. No acute intracranial abnormality. 2. No acute abnormality of the cervical spine.   Electronically Signed   By: Rozetta Nunnery M.D.   On: 02/07/2014 10:09    Ct Thoracic Spine Wo Contrast   02/07/2014  CLINICAL DATA:  80 year old woman following fall. Back pain. Spinal tenderness at the neck and radiating through the lumbar spine.  EXAM: CT THORACIC AND LUMBAR SPINE WITHOUT CONTRAST  TECHNIQUE: Multidetector CT imaging of the thoracic and lumbar spine was performed without contrast. Multiplanar CT image reconstructions were also generated.  COMPARISON:  Cervical spine CT 02/07/2014.  Abdominal CT 01/28/2008.  FINDINGS: CT THORACIC SPINE FINDINGS  Cervical rib its are present bilaterally at C7. Numbering was performed contiguously from the craniocervical junction on prior cervical spine CT and the cervical ribs as references. In the lumbar spine, there are 5 non-rib-bearing lumbar type vertebral bodies with rudimentary ribs at T12. Sacralization of the  transverse processes of L5. Levoconvex curve of the thoracolumbar spine with the apex at T12. Multilevel compression fractures are present. Paraspinal soft tissues demonstrate a 16 mm left thyroid lobe cystic lesion. Atherosclerosis is present including coronary artery disease. Lungs show extensive calcification of the tracheobronchial tree. Small hiatal hernia incidentally noted.  No gross hematomas identified in the central canal. There is no definite thoracic paravertebral phlegmon to suggest an acute compression fracture. Mild T2 superior endplate compression fracture. T3 a also shows a superior endplate compression fracture. T4 and T5 show preserved height and there appears to be ossification across the disc space and ankylosis. T6 shows a superior endplate compression fracture with 20% loss height and no retropulsion. T7 shows 25% loss of vertebral body height. T8 is preserved. T9 shows about 10% loss of vertebral body height without retropulsion. T10 is intact. T11 shows a superior endplate compression fracture with 25% loss of vertebral body height and mild retropulsion. T12 shows a biconcave compression fracture with 25% loss of vertebral body height but no retropulsion.  Osseous ridging is present throughout the thoracolumbar spine along with some calcified disc protrusions. No critical central stenosis is identified.  CT LUMBAR SPINE FINDINGS  Thoracic spinal alignment appears unchanged compared MRI 03/08/2012. Numbering convention used on the prior MRI is preserved. Sacral irregularity is present best seen on the sagittal images, which is a chronic finding seen on the MRI 03/18/2012.  Bilateral SI joint degenerative disease is present. Sclerotic lesion present in the left sacral ala appears new compared to CT 2009. This measures 8 mm and is indeterminate.  T12 biconcave compression fracture appears similar to prior MRI. L2 superior endplate compression fracture with retropulsion also appears unchanged.  L3 shows preserved vertebral body height. L4 and L5 also show preserved vertebral body height without an acute fracture. Severe L4-L5 degenerative disc disease. Allowing for differences in technique, there is little if any interval change compared to prior MRI. Paraspinal soft tissues demonstrate dense aortoiliac and visceral atherosclerosis. Unchanged nodule in the left adrenal gland which may represent a large adenoma or myelolipoma.  IMPRESSION: CT THORACIC SPINE IMPRESSION  1. 16 mm left thyroid lobe cystic lesion. Consider further evaluation with thyroid ultrasound. If patient is clinically hyperthyroid, consider nuclear medicine thyroid uptake and scan. 2. Multilevel compression fractures detailed above. No definite paravertebral phlegmon to suggest an acute thoracic compression fracture. 3. Because of the multiplicity of thoracic compression fractures, MRI of the thoracic and lumbar spine should be considered to assess for bone marrow edema.  CT LUMBAR SPINE IMPRESSION  1. Indeterminate 8 mm sclerotic lesion in the left sacral ala, new compared to 2009. 2. Old sacral fracture demonstrated on prior MRI. 3. Chronic L1 and L2 compression fractures with retropulsion of L2. No definite acute osseous abnormality in the lumbar spine compared to  prior MRI 03/18/2012. Moderate central stenosis associated with retropulsion of L2.   Electronically Signed   By: Dereck Ligas M.D.   On: 02/07/2014 10:47    Ct Lumbar Spine Wo Contrast   02/07/2014   CLINICAL DATA:  80 year old woman following fall. Back pain. Spinal tenderness at the neck and radiating through the lumbar spine.  EXAM: CT THORACIC AND LUMBAR SPINE WITHOUT CONTRAST  TECHNIQUE: Multidetector CT imaging of the thoracic and lumbar spine was performed without contrast. Multiplanar CT image reconstructions were also generated.  COMPARISON:  Cervical spine CT 02/07/2014.  Abdominal CT 01/28/2008.  FINDINGS: CT THORACIC SPINE FINDINGS  Cervical rib its are  present bilaterally at C7. Numbering was performed contiguously from the craniocervical junction on prior cervical spine CT and the cervical ribs as references. In the lumbar spine, there are 5 non-rib-bearing lumbar type vertebral bodies with rudimentary ribs at T12. Sacralization of the transverse processes of L5. Levoconvex curve of the thoracolumbar spine with the apex at T12. Multilevel compression fractures are present. Paraspinal soft tissues demonstrate a 16 mm left thyroid lobe cystic lesion. Atherosclerosis is present including coronary artery disease. Lungs show extensive calcification of the tracheobronchial tree. Small hiatal hernia incidentally noted.  No gross hematomas identified in the central canal. There is no definite thoracic paravertebral phlegmon to suggest an acute compression fracture. Mild T2 superior endplate compression fracture. T3 a also shows a superior endplate compression fracture. T4 and T5 show preserved height and there appears to be ossification across the disc space and ankylosis. T6 shows a superior endplate compression fracture with 20% loss height and no retropulsion. T7 shows 25% loss of vertebral body height. T8 is preserved. T9 shows about 10% loss of vertebral body height without retropulsion. T10 is intact. T11 shows a superior endplate compression fracture with 25% loss of vertebral body height and mild retropulsion. T12 shows a biconcave compression fracture with 25% loss of vertebral body height but no retropulsion.  Osseous ridging is present throughout the thoracolumbar spine along with some calcified disc protrusions. No critical central stenosis is identified.  CT LUMBAR SPINE FINDINGS  Thoracic spinal alignment appears unchanged compared MRI 03/08/2012. Numbering convention used on the prior MRI is preserved. Sacral irregularity is present best seen on the sagittal images, which is a chronic finding seen on the MRI 03/18/2012.  Bilateral SI joint degenerative  disease is present. Sclerotic lesion present in the left sacral ala appears new compared to CT 2009. This measures 8 mm and is indeterminate.  T12 biconcave compression fracture appears similar to prior MRI. L2 superior endplate compression fracture with retropulsion also appears unchanged. L3 shows preserved vertebral body height. L4 and L5 also show preserved vertebral body height without an acute fracture. Severe L4-L5 degenerative disc disease. Allowing for differences in technique, there is little if any interval change compared to prior MRI. Paraspinal soft tissues demonstrate dense aortoiliac and visceral atherosclerosis. Unchanged nodule in the left adrenal gland which may represent a large adenoma or myelolipoma.  IMPRESSION: CT THORACIC SPINE IMPRESSION  1. 16 mm left thyroid lobe cystic lesion. Consider further evaluation with thyroid ultrasound. If patient is clinically hyperthyroid, consider nuclear medicine thyroid uptake and scan. 2. Multilevel compression fractures detailed above. No definite paravertebral phlegmon to suggest an acute thoracic compression fracture. 3. Because of the multiplicity of thoracic compression fractures, MRI of the thoracic and lumbar spine should be considered to assess for bone marrow edema.  CT LUMBAR SPINE IMPRESSION  1. Indeterminate 8 mm sclerotic  lesion in the left sacral ala, new compared to 2009. 2. Old sacral fracture demonstrated on prior MRI. 3. Chronic L1 and L2 compression fractures with retropulsion of L2. No definite acute osseous abnormality in the lumbar spine compared to prior MRI 03/18/2012. Moderate central stenosis associated with retropulsion of L2.   Electronically Signed   By: Dereck Ligas M.D.   On: 02/07/2014 10:47     Assessment/Plan: Diagnosis: lumbar compression and sacral fxs (acute on chronic) exacerbated by recent fall Does the need for close, 24 hr/day medical supervision in concert with the patient's rehab needs make it unreasonable  for this patient to be served in a less intensive setting? Yes Co-Morbidities requiring supervision/potential complications: pain mgt, dm2 with neuropathy. Chf, htn Due to bladder management, bowel management, safety, skin/wound care, disease management, medication administration, pain management and patient education, does the patient require 24 hr/day rehab nursing? Yes Does the patient require coordinated care of a physician, rehab nurse, PT (1-2 hrs/day, 5 days/week) and OT (1-2 hrs/day, 5 days/week) to address physical and functional deficits in the context of the above medical diagnosis(es)? Yes Addressing deficits in the following areas: balance, endurance, locomotion, strength, transferring, bowel/bladder control, bathing, dressing, feeding, grooming and toileting Can the patient actively participate in an intensive therapy program of at least 3 hrs of therapy per day at least 5 days per week? Yes The potential for patient to make measurable gains while on inpatient rehab is excellent Anticipated functional outcomes upon discharge from inpatient rehab are modified independent and supervision  with PT, modified independent and supervision with OT, n/a with SLP. Estimated rehab length of stay to reach the above functional goals is: 12-16 days Does the patient have adequate social supports to accommodate these discharge functional goals? Yes Anticipated D/C setting: Home Anticipated post D/C treatments: HH therapy Overall Rehab/Functional Prognosis: excellent   RECOMMENDATIONS: This patient's condition is appropriate for continued rehabilitative care in the following setting: CIR Patient has agreed to participate in recommended program. Yes Note that insurance prior authorization may be required for reimbursement for recommended care.   Comment: Rehab Admissions Coordinator to follow up.   Thanks,   Meredith Staggers, MD, Mellody Drown         02/09/2014    Revision History...      Date/Time User Action   02/09/2014 8:51 AM Meredith Staggers, MD Sign   02/09/2014 6:22 AM Cathlyn Parsons, PA-C Pend  View Details Report   Routing History...     Date/Time From To Method   02/09/2014 8:51 AM Meredith Staggers, MD Meredith Staggers, MD In Basket   02/09/2014 8:51 AM Meredith Staggers, MD Biagio Borg, MD In Basket

## 2014-02-09 NOTE — Care Management Note (Signed)
    Page 1 of 1   02/09/2014     1:59:32 PM CARE MANAGEMENT NOTE 02/09/2014  Patient:  Lindsay Sheppard, Lindsay Sheppard   Account Number:  [id number]  Date Initiated:  02/09/2014  Documentation initiated by:  Zamariah Seaborn  Subjective/Objective Assessment:   Pt adm on 02/07/14 with fall, ? syncope, compression fractures.  PTA, pt resides at home with family.     Action/Plan:   Rehab consult completed; pt appropriate for CIR admission.   Anticipated DC Date:  02/09/2014   Anticipated DC Plan:  IP REHAB FACILITY      DC Planning Services  CM consult      Choice offered to / List presented to:             Status of service:  Completed, signed off Medicare Important Message given?  YES (If response is "NO", the following Medicare IM given date fields will be blank) Date Medicare IM given:  02/07/2014 Date Additional Medicare IM given:    Discharge Disposition:  IP REHAB FACILITY  Per UR Regulation:  Reviewed for med. necessity/level of care/duration of stay  If discussed at [place] of Stay Meetings, dates discussed:    Comments:  02/09/14 Ellan Lambert, RN, BSN [phone] Pt discharging to ip rehab today.

## 2014-02-10 ENCOUNTER — Encounter (HOSPITAL_COMMUNITY): Payer: Medicare Other | Admitting: Occupational Therapy

## 2014-02-10 ENCOUNTER — Inpatient Hospital Stay (HOSPITAL_COMMUNITY): Payer: Medicare Other | Admitting: *Deleted

## 2014-02-10 ENCOUNTER — Inpatient Hospital Stay (HOSPITAL_COMMUNITY): Payer: Medicare Other | Admitting: Physical Therapy

## 2014-02-10 LAB — COMPREHENSIVE METABOLIC PANEL
ALT: 10 U/L (ref 0–35)
AST: 13 U/L (ref 0–37)
Albumin: 3.2 g/dL — ABNORMAL LOW (ref 3.5–5.2)
Alkaline Phosphatase: 55 U/L (ref 39–117)
BILIRUBIN TOTAL: 0.3 mg/dL (ref 0.3–1.2)
BUN: 42 mg/dL — ABNORMAL HIGH (ref 6–23)
CALCIUM: 9.5 mg/dL (ref 8.4–10.5)
CHLORIDE: 104 meq/L (ref 96–112)
CO2: 23 meq/L (ref 19–32)
CREATININE: 1.54 mg/dL — AB (ref 0.50–1.10)
GFR calc Af Amer: 33 mL/min — ABNORMAL LOW (ref >90)
GFR, EST NON AFRICAN AMERICAN: 29 mL/min — AB (ref >90)
GLUCOSE: 197 mg/dL — AB (ref 70–99)
Potassium: 4.8 mEq/L (ref 3.7–5.3)
Sodium: 140 mEq/L (ref 137–147)
Total Protein: 5.7 g/dL — ABNORMAL LOW (ref 6.0–8.3)

## 2014-02-10 LAB — CBC WITH DIFFERENTIAL/PLATELET
Basophils Absolute: 0 10*3/uL (ref 0.0–0.1)
Basophils Relative: 0 % (ref 0–1)
Eosinophils Absolute: 0.1 10*3/uL (ref 0.0–0.7)
Eosinophils Relative: 3 % (ref 0–5)
HCT: 36.2 % (ref 36.0–46.0)
Hemoglobin: 11.6 g/dL — ABNORMAL LOW (ref 12.0–15.0)
Lymphocytes Relative: 15 % (ref 12–46)
Lymphs Abs: 0.8 10*3/uL (ref 0.7–4.0)
MCH: 28.9 pg (ref 26.0–34.0)
MCHC: 32 g/dL (ref 30.0–36.0)
MCV: 90 fL (ref 78.0–100.0)
Monocytes Absolute: 0.5 10*3/uL (ref 0.1–1.0)
Monocytes Relative: 9 % (ref 3–12)
Neutro Abs: 3.7 10*3/uL (ref 1.7–7.7)
Neutrophils Relative %: 73 % (ref 43–77)
Platelets: 123 10*3/uL — ABNORMAL LOW (ref 150–400)
RBC: 4.02 MIL/uL (ref 3.87–5.11)
RDW: 16.1 % — ABNORMAL HIGH (ref 11.5–15.5)
WBC: 5.1 10*3/uL (ref 4.0–10.5)

## 2014-02-10 LAB — GLUCOSE, CAPILLARY
Glucose-Capillary: 178 mg/dL — ABNORMAL HIGH (ref 70–99)
Glucose-Capillary: 222 mg/dL — ABNORMAL HIGH (ref 70–99)
Glucose-Capillary: 287 mg/dL — ABNORMAL HIGH (ref 70–99)
Glucose-Capillary: 158 mg/dL — ABNORMAL HIGH (ref 70–99)

## 2014-02-10 LAB — PROTIME-INR
INR: 1.57 — ABNORMAL HIGH (ref 0.00–1.49)
Prothrombin Time: 18.3 seconds — ABNORMAL HIGH (ref 11.6–15.2)

## 2014-02-10 MED ORDER — ENOXAPARIN SODIUM 30 MG/0.3ML ~~LOC~~ SOLN: 30.0000 mg | Freq: Two times a day (BID) | SUBCUTANEOUS | Status: DC

## 2014-02-10 MED ORDER — WARFARIN SODIUM 2.5 MG PO TABS
2.5000 mg | ORAL_TABLET | Freq: Once | ORAL | Status: AC
  Administered 2014-02-10: 2.5 mg via ORAL
  Filled 2014-02-10: qty 1

## 2014-02-10 MED ORDER — GLUCERNA SHAKE PO LIQD
237.0000 mL | Freq: Three times a day (TID) | ORAL | Status: DC
  Administered 2014-02-10 – 2014-02-15 (×18): 237 mL via ORAL

## 2014-02-10 MED ORDER — ENOXAPARIN SODIUM 60 MG/0.6ML ~~LOC~~ SOLN
50.0000 mg | SUBCUTANEOUS | Status: DC
  Administered 2014-02-10 – 2014-02-15 (×6): 50 mg via SUBCUTANEOUS
  Filled 2014-02-10 (×7): qty 0.6

## 2014-02-10 NOTE — Progress Notes (Signed)
Patient information reviewed and entered into eRehab system by Aizlyn Schifano, RN, CRRN, PPS Coordinator.  Information including medical coding and functional independence measure will be reviewed and updated through discharge.     Per nursing patient was given "Data Collection Information Summary for Patients in Inpatient Rehabilitation Facilities with attached "Privacy Act Statement-Health Care Records" upon admission.  

## 2014-02-10 NOTE — Progress Notes (Signed)
Physical Therapy Session Note  Patient Details  Name: CHRISTINA GINTZ MRN: [id number] Date of Birth: 05-06-1921  Today's Date: 02/10/2014 Time: 6770-3403 Time Calculation (min): 45 min  Short Term Goals: Week 1:  PT Short Term Goal 1 (Week 1): Pt will perform bed mobility on flat bed with log roll with mod A and mod verbal cues PT Short Term Goal 2 (Week 1): Pt will perform bed <> w/c transfers stand pivot with UE support and mod A consistently PT Short Term Goal 3 (Week 1): Pt will peform gait x 75' with RW and mod A PT Short Term Goal 4 (Week 1): Pt will negotiate 2-3 stairs with 2 rails and mod A  PT Short Term Goal 5 (Week 1): Pt will tolerate dynamic standing balance activities x 3 minutes with pain <5/10  Skilled Therapeutic Interventions/Progress Updates:   Pt resting in w/c with family present.  Pt without c/o pain this pm and appears less anxious.  Pt requesting to use the toilet.  Performed w/c <> toilet transfers stand pivot with grab bar and min-mod A.  Pt stood with min A to steady pt while pt performed clothing management and partial hygiene (requried therapist to finish) and for hand hygiene at sink.  In gym performed stair negotiation training up/down 5 stairs with 2 rails with mod A for weight shifting and to assist with advancing COG up to next step secondary to LE weakness.  Continued gait training with RW x 50' with min-mod A with verbal and tactile cues for lateral weight shifting, trunk/pelvic rotation and safe obstacle negotiation with RW; pt continues to ambulate with narrow BOS, L foot ER and L lateral lean and decreased weight shift to R.  Performed lateral stepping x 25' to L and R with min-mod bilat HHA to focus on proximal hip strengthening, lateral weight shifting and dynamic balance.  Returned to room and transferred w/c > recliner to rest with min-mod HHA.       Therapy Documentation Precautions:  Precautions Precautions: Fall Precaution Comments: Notify  physician for pulse less than 55 or greater than 120, respiratory rate less than 12 or greater than 25, temperature greater than 100.5 F, urinary output less than 30 mL/kg/hr for four hours, systolic BP less than 90 or greater than 524, diastolic BP less than 60 or greater than 100.; monitor orthostatic vitals; peripheral neuropathy, HOH, deaf in L ear Restrictions Weight Bearing Restrictions: No Vital Signs: Therapy Vitals Temp: 97.5 F (36.4 C) Temp src: Oral Pulse Rate: 87 Resp: 16 BP: 117/62 mmHg Patient Position (if appropriate): Standing Oxygen Therapy SpO2: 99 % O2 Device: None (Room air) Pain: Pain Assessment Pain Assessment: No/denies pain Locomotion : Ambulation Ambulation/Gait Assistance: 4: Min assist   See FIM for current functional status  Therapy/Group: Individual Therapy  Raylene Everts [facility] 02/10/2014, 4:50 PM

## 2014-02-10 NOTE — Progress Notes (Signed)
Subjective/Complaints: 80 y.o. right-handed female with history of HTN, DM type 2, postural hypotension, CAF--chronic coumadin, chronic back pain who fell while getting of the commode on 02/07/14 (question due to slipping or syncopal episode) and had onset of severe back pain. She was admitted for work up and Cranial CT scan negative for acute changes. Cardiac enzymes negative and 2D echocardiogram with ejection fraction of 55% and normal systolic function. CT of spine with cervical spondylolisthesis C6 on C7, multilevel compression fractures T3, T7, T11, T12, L2, severe DDD L4/L5 and old sacral fracture with new sclerotic lesion left sacral ala  Review of Systems - Negative except low back pain and leg shakiness during therapy Objective: Vital Signs: Blood pressure 168/88, pulse 76, temperature 97.8 F (36.6 C), temperature source Oral, resp. rate 16, height $RemoveBe'5\' 6"'ZxRUIbOsj$  (1.676 m), weight 57.4 kg (126 lb 8.7 oz), SpO2 98.00%. No results found. Results for orders placed during the hospital encounter of 02/09/14 (from the past 72 hour(s))  GLUCOSE, CAPILLARY     Status: Abnormal   Collection Time    02/09/14  5:10 PM      Result Value Ref Range   Glucose-Capillary 299 (*) 70 - 99 mg/dL   Comment 1 Notify RN    GLUCOSE, CAPILLARY     Status: Abnormal   Collection Time    02/09/14  8:52 PM      Result Value Ref Range   Glucose-Capillary 128 (*) 70 - 99 mg/dL  CBC WITH DIFFERENTIAL     Status: Abnormal   Collection Time    02/10/14  7:05 AM      Result Value Ref Range   WBC 5.1  4.0 - 10.5 K/uL   RBC 4.02  3.87 - 5.11 MIL/uL   Hemoglobin 11.6 (*) 12.0 - 15.0 g/dL   HCT 36.2  36.0 - 46.0 %   MCV 90.0  78.0 - 100.0 fL   MCH 28.9  26.0 - 34.0 pg   MCHC 32.0  30.0 - 36.0 g/dL   RDW 16.1 (*) 11.5 - 15.5 %   Platelets 123 (*) 150 - 400 K/uL   Neutrophils Relative % 73  43 - 77 %   Neutro Abs 3.7  1.7 - 7.7 K/uL   Lymphocytes Relative 15  12 - 46 %   Lymphs Abs 0.8  0.7 - 4.0 K/uL   Monocytes  Relative 9  3 - 12 %   Monocytes Absolute 0.5  0.1 - 1.0 K/uL   Eosinophils Relative 3  0 - 5 %   Eosinophils Absolute 0.1  0.0 - 0.7 K/uL   Basophils Relative 0  0 - 1 %   Basophils Absolute 0.0  0.0 - 0.1 K/uL  COMPREHENSIVE METABOLIC PANEL     Status: Abnormal   Collection Time    02/10/14  7:05 AM      Result Value Ref Range   Sodium 140  137 - 147 mEq/L   Potassium 4.8  3.7 - 5.3 mEq/L   Chloride 104  96 - 112 mEq/L   CO2 23  19 - 32 mEq/L   Glucose, Bld 197 (*) 70 - 99 mg/dL   BUN 42 (*) 6 - 23 mg/dL   Creatinine, Ser 1.54 (*) 0.50 - 1.10 mg/dL   Calcium 9.5  8.4 - 10.5 mg/dL   Total Protein 5.7 (*) 6.0 - 8.3 g/dL   Albumin 3.2 (*) 3.5 - 5.2 g/dL   AST 13  0 - 37 U/L   ALT  10  0 - 35 U/L   Alkaline Phosphatase 55  39 - 117 U/L   Total Bilirubin 0.3  0.3 - 1.2 mg/dL   GFR calc non Af Amer 29 (*) >90 mL/min   GFR calc Af Amer 33 (*) >90 mL/min   Comment: (NOTE)     The eGFR has been calculated using the CKD EPI equation.     This calculation has not been validated in all clinical situations.     eGFR's persistently <90 mL/min signify possible Chronic Kidney     Disease.  PROTIME-INR     Status: Abnormal   Collection Time    02/10/14  7:05 AM      Result Value Ref Range   Prothrombin Time 18.3 (*) 11.6 - 15.2 seconds   INR 1.57 (*) 0.00 - 1.49  GLUCOSE, CAPILLARY     Status: Abnormal   Collection Time    02/10/14  7:22 AM      Result Value Ref Range   Glucose-Capillary 178 (*) 70 - 99 mg/dL   Comment 1 Notify RN        Physical Exam  Constitutional: She is oriented to person, place, and time.  HENT:  Head: Normocephalic.  Eyes: EOM are normal.  Neck: Normal range of motion. Neck supple. No thyromegaly present.  Cardiovascular:  Cardiac rate controlled  Respiratory: Effort normal and breath sounds normal. No respiratory distress.  GI: Soft. Bowel sounds are normal. She exhibits no distension.  Neurological: She is alert and oriented to person, place, and  time.  Patient makes good eye contact with examiner. HOH. Reasonable insight and awareness. Moves all 4's but proximal LE's limited by pain. UE's 5/5. HF 2+, KE 3+, ADF/APF 4/5. Stocking glove sensory loss to above the calves.  Skin: Skin is warm and dry.  Psychiatric: She has a normal mood and affect. Her behavior is normal. Thought content normal.    Assessment/Plan: 1. Functional deficits secondary to Fall resulting in lumbar and thoracic compression fractures which require 3+ hours per day of interdisciplinary therapy in a comprehensive inpatient rehab setting. Physiatrist is providing close team supervision and 24 hour management of active medical problems listed below. Physiatrist and rehab team continue to assess barriers to discharge/monitor patient progress toward functional and medical goals. FIM:       FIM - Toileting Toileting steps completed by patient: Performs perineal hygiene Toileting: 2: Max-Patient completed 1 of 3 steps  FIM - Diplomatic Services operational officer Devices: Bedside commode Toilet Transfers: 2-To toilet/BSC: Max A (lift and lower assist);2-From toilet/BSC: Max A (lift and lower assist)  FIM - Banker Devices: Bed rails Bed/Chair Transfer: 2: Supine > Sit: Max A (lifting assist/Pt. 25-49%);2: Bed > Chair or W/C: Max A (lift and lower assist);2: Chair or W/C > Bed: Max A (lift and lower assist)  FIM - Locomotion: Wheelchair Distance: 150 Locomotion: Wheelchair: 1: Total Assistance/staff pushes wheelchair (Pt<25%) FIM - Locomotion: Ambulation Locomotion: Ambulation Assistive Devices: Other (comment) (bilat HHA) Ambulation/Gait Assistance: 1: +2 Total assist Locomotion: Ambulation: 1: Two helpers  Comprehension Comprehension Mode: Auditory Comprehension: 5-Understands basic 90% of the time/requires cueing < 10% of the time  Expression Expression Mode: Verbal Expression: 5-Expresses basic needs/ideas:  With extra time/assistive device  Social Interaction Social Interaction: 5-Interacts appropriately 90% of the time - Needs monitoring or encouragement for participation or interaction.  Problem Solving Problem Solving: 4-Solves basic 75 - 89% of the time/requires cueing 10 - 24% of the  time  Memory Memory: 4-Recognizes or recalls 75 - 89% of the time/requires cueing 10 - 24% of the time   Medical Problem List and Plan:  1. Functional deficits secondary to lumbar compression and sacral fxs (acute on chronic) exacerbated by recent fall  2. DVT Prophylaxis/Anticoagulation: Pharmaceutical: Coumadin  3. Pain Management: Will continue tylenol 650 mg tid. Ice and/or heat prn for comfort. Oxycodone prn for severe pain.  4. Mood: LCSW to follow for evaluation and support.  5. Neuropsych: This patient is capable of making decisions on her own behalf.  6. DM type 2: Will monitor with ac/hs checks. Continue Lantus 10 units daiy with SSI for elevated BS.  7. HTN: Monitor every 8 hours. Continue catapres TTS 3 patch and labetalol bid.  8. Chronic constipation: Miralax bid as well as senna daily to avoid recurrent issues with constipation and rectal pain. Limit narcotics.  9. CAF: Monitor HR every 8 hours. On coumadin with INR closely monitored by PharmD  10. Iron deficiency anemia: Continue iron supplement bid.  11. Chronic diastolic dysfunction: check daily weights. Low salt diet. Monitor for signs of overload. Continue to hold lasix for now.  12. CKD: baseline Cr-1.7. BUN with mild elevation. Encourage po fluids..  13. H/o orthostatic hypotension/syncope: Question vasovagal reaction--check orthostatic BP/pulse and not to be left alone while in BR.   LOS (Days) 1 A FACE TO FACE EVALUATION WAS PERFORMED  Miriam Kestler E 02/10/2014, 10:27 AM

## 2014-02-10 NOTE — Telephone Encounter (Signed)
Entered in error

## 2014-02-10 NOTE — Evaluation (Signed)
Physical Therapy Assessment and Plan  Patient Details  Name: Lindsay Sheppard MRN: [id number] Date of Birth: [birth year]  PT Diagnosis: Abnormal posture, Cognitive deficits, Difficulty walking, Dizziness and giddiness, Impaired sensation, Muscle weakness and Pain in back Rehab Potential: Good ELOS: 10-12 days   Today's Date: 02/10/2014 Time: 0905-1005 Time Calculation (min): 60 min  Problem List:  Patient Active Problem List   Diagnosis Date Noted  . 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 Supratherapeutic INR 02/09/2014  . Recurrent UTI 02/09/2014  . Fractures 02/09/2014  . Chronic constipation 02/08/2014  . Hyperkalemia 02/07/2014  . Fall 02/07/2014  . Compression fracture 02/07/2014  . Syncope 02/07/2014  . Encounter for therapeutic drug monitoring 09/27/2013  . Foot pain 09/27/2013  . Rectal pain 09/27/2013  . Hearing loss, right 09/27/2013  . Left foot pain 05/12/2013  . Left shoulder pain 03/22/2013  . Abdominal  pain, other specified site 06/23/2012  . Fever 03/24/2012  . Lumbar back pain 03/10/2012  . Gait disorder 03/10/2012  . Fatigue 09/04/2011  . 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 Preventative health care 11/29/2010  . Pulmonary embolism 10/14/2010  . Other specified forms of hearing loss 05/30/2010  . DIASTOLIC HEART FAILURE, CHRONIC 12/14/2009  . POSTHERPETIC NEURALGIA 11/02/2009  . ALLERGIC RHINITIS 11/02/2009  . RENAL INSUFFICIENCY 07/02/2009  . POSTURAL HYPOTENSION 02/07/2008  . SYNCOPE 02/07/2008  . DEPRESSIVE DISORDER 01/25/2008  . CONSTIPATION 01/25/2008  . DIABETES MELLITUS, TYPE II 09/06/2007  . RENAL CYST, LEFT 09/06/2007  . B12 DEFICIENCY 05/23/2007  . HYPERLIPIDEMIA 05/23/2007  . Atrial fibrillation 05/23/2007  . OSTEOPOROSIS 05/23/2007  . SHINGLES, HX OF 05/23/2007  . ANEMIA-IRON DEFICIENCY 01/26/2007  . HYPERTENSION 01/26/2007  . GERD 01/26/2007    Past Medical History:  Past Medical History  Diagnosis Date  .  POSTHERPETIC NEURALGIA 11/02/2009  . B12 DEFICIENCY 05/23/2007  . HYPERLIPIDEMIA 05/23/2007  . HYPERKALEMIA 05/30/2010  . ANEMIA-IRON DEFICIENCY 01/26/2007  . BLEPHARITIS, BILATERAL 02/23/2008  . Other specified forms of hearing loss 05/30/2010  . HYPERTENSION 01/26/2007  . Atrial fibrillation 05/23/2007  . DIASTOLIC HEART FAILURE, CHRONIC 12/14/2009  . POSTURAL HYPOTENSION 02/07/2008  . ALLERGIC RHINITIS 11/02/2009  . GERD 01/26/2007  . CONSTIPATION 01/25/2008  . SKIN LESION 05/30/2010  . BACK PAIN 08/02/2007  . OSTEOPOROSIS 05/23/2007  . SYNCOPE 02/07/2008  . FATIGUE 09/06/2007  . Dysuria 09/05/2008  . Abdominal pain, epigastric 09/06/2007  . EPIGASTRIC TENDERNESS 10/12/2007  . PULMONARY EMBOLISM, HX OF 05/23/2007  . SHINGLES, HX OF 05/23/2007  . 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 Left foot pain   . Diabetic neuropathy   . RENAL INSUFFICIENCY 07/02/2009  . RENAL CYST, LEFT 09/06/2007  . Kidney stones     "never had OR"  . Family history of anesthesia complication     "daughter has PONV; another daughter a little anesthesia lasts way too long"  . CHF (congestive heart failure) 2011  . Myocardial infarction 2011    "mild"  . DVT (deep venous thrombosis) ~ 1964    "think it was in the left"  . Aspiration pneumonia ~ 2006    "due to aspiration"  . DIABETES MELLITUS, TYPE II 09/06/2007  . Arthritis     "hands" (02/07/2014)  . DEPRESSIVE DISORDER 01/25/2008    "@ times; never treated for it"  . Melanoma of nose   . Compression fracture of lumbar vertebra     hx  . Compression fracture of thoracic vertebra 02/07/2014    "fell this am" (02/07/2014  Past Surgical History:  Past Surgical History  Procedure Laterality Date  . Appendectomy  1943  . Abdominal hysterectomy  1964  . Cardiac electrophysiology mapping and ablation  1999  . Cataract extraction w/ intraocular lens  implant, bilateral  2003-2005  . Carpal tunnel release Right 2004  . Mohs surgery  2011    "removed off her nose"  . Cardiac  catheterization  10/2009  . Esophageal dilation  X 2    Assessment & Plan Clinical Impression: Patient is a 80 y.o. right-handed female with history of HTN, DM type 2, postural hypotension, CAF--chronic coumadin, chronic back pain who fell while getting of the commode on 02/07/14 (question due to slipping or syncopal episode) and had onset of severe back pain. She was admitted for work up and Cranial CT scan negative for acute changes. Cardiac enzymes negative and 2D echocardiogram with ejection fraction of 55% and normal systolic function. CT of spine with cervical spondylolisthesis C6 on C7, multilevel compression fractures T3, T7, T11, T12, L2, severe DDD L4/L5 and old sacral fracture with new sclerotic lesion left sacral ala.  Patient transferred to CIR on 02/09/2014 .   Patient currently requires max with mobility secondary to muscle weakness and pain in mid/lower back, decreased cardiorespiratoy endurance, decreased memory and decreased sitting balance, decreased standing balance, decreased postural control, decreased balance strategies and dizziness with transitional movements and anxiety/fear of falling.  Prior to hospitalization, patient was modified independent  with RW for mobility in her home but required bilat HHA for gait outside and lived with Son in a House home.  Home access is 2Stairs to enter with 2 rails.    Patient will benefit from skilled PT intervention to maximize safe functional mobility, minimize fall risk and decrease caregiver burden for planned discharge home with 24 hour assist.  Anticipate patient will benefit from follow up [facility] at discharge.  PT - End of Session Activity Tolerance: Decreased this session Endurance Deficit: Yes Endurance Deficit Description: anxiety; generalized deconditioning.   PT Assessment Rehab Potential: Good PT Patient demonstrates impairments in the following area(s): Balance;Endurance;Motor;Pain;Sensory PT Transfers Functional Problem(s): Bed  Mobility;Bed to Chair;Car;Furniture PT Locomotion Functional Problem(s): Ambulation;Stairs PT Plan PT Intensity: Minimum of 1-2 x/day ,45 to 90 minutes PT Frequency: 5 out of 7 days PT Duration Estimated Length of Stay: 10-12 days PT Treatment/Interventions: Ambulation/gait training;Balance/vestibular training;Discharge planning;DME/adaptive equipment instruction;Functional mobility training;Neuromuscular re-education;Pain management;Patient/family education;Psychosocial support;Stair training;Therapeutic Activities;Therapeutic Exercise;UE/LE Strength taining/ROM PT Transfers Anticipated Outcome(s): Supervision PT Locomotion Anticipated Outcome(s): Supervision with RW for inside; bilat HHA outside PT Recommendation Recommendations for Other Services: Neuropsych consult;Vestibular eval (acute care noted R BPPV; may need f/u vestibular eval to confirm) Follow Up Recommendations: Home health PT Patient destination: Home Equipment Recommended: Other (comment) Equipment Details: Transport w/c  Skilled Therapeutic Intervention Following log roll training in bed pt requesting to use BSC.  Performed multiple stand pivot transfers bed > BSC>w/c with max A for lifting assistance and for balance while pivoting and assistance for controlled stand > sit.  Performed static standing with mod-max A for clothing management but pt was able to perform hygiene in sitting.   Discussed with pt and family pt's: PLOF, home set up, assistance available, equipment needs.  Daughter reporting pt has h/o falls and has been gradually been getting weaker and consuming less food and drink.   Daughter reporting that on acute care the therapist diagnosed the pt with R BPPV; may require vestibular re-evaluation secondary to pt denying dizziness during rolling in bed.  Discussed with family and RN use of Kpad and corsett for pain/support; will f/u with PA or MD  PT Evaluation Precautions/Restrictions Precautions Precautions:  Fall Precaution Comments: Notify physician for pulse less than 55 or greater than 120, respiratory rate less than 12 or greater than 25, temperature greater than 100.5 F, urinary output less than 30 mL/kg/hr for four hours, systolic BP less than 90 or greater than 212, diastolic BP less than 60 or greater than 100.; monitor orthostatic vitals; peripheral neuropathy, HOH, deaf in L ear Restrictions Weight Bearing Restrictions: No General Chart Reviewed: Yes Additional Pertinent History: h/o falls, AFIB, DM with PN, orthostatic hypotension, daughter report decline in pt eating/drinking and loss of weight Response to Previous Treatment: Not applicable Family/Caregiver Present: Yes (daughter and granddaughter)  Pain Pain Assessment Pain Score: 5  Pain Type: Acute pain Pain Location: Back Pain Orientation: Lower;Left Pain Descriptors / Indicators: Aching;Sore Pain Onset: With Activity Pain Intervention(s): RN made aware;Repositioned;Rest Home Living/Prior Functioning Home Living Available Help at Discharge: Family (5 kids) Type of Home: House Home Access: Stairs to enter [employer] of Steps: 2 Entrance Stairs-Rails: Right;Left;Can reach both Home Layout: Two level;Able to live on main level with bedroom/bathroom Additional Comments: Lives with son. Other family members available who live on the "farm"; pt uses BSC at night beside bed  Lives With: Son Prior Function Level of Independence: Independent with gait;Independent with transfers;Requires assistive device for independence;Needs assistance with homemaking  Able to Take Stairs?: Yes (with rails and min A) Driving: No Comments: Used RW inside PTA, bilat HHA outside.  Did some cooking but standing tolerance was limited to 5-10 minutes secondary to back pain Cognition Arousal/Alertness: Awake/alert Orientation Level: Oriented to person;Oriented to place;Oriented to situation Memory: Impaired Memory Impairment: Decreased  recall of new information;Decreased short term memory Decreased Short Term Memory: Verbal basic;Verbal complex Sensation Sensation Light Touch: Impaired by gross assessment Stereognosis: Not tested Hot/Cold: Not tested Proprioception: Impaired by gross assessment Additional Comments: Secondary to peripheral neuropathy pt reporting impaired sensation in feet and impaired ability to tell where her feet are Coordination Gross Motor Movements are Fluid and Coordinated: No (pt with tremors) Motor  Motor Motor: Abnormal postural alignment and control Motor - Skilled Clinical Observations: generalized weakness; sits and stands with significant posterior and L lateral lean  Mobility Bed Mobility Bed Mobility: Supine to Sit;Sit to Supine Supine to Sit: 2: Max assist;With rails;HOB flat Supine to Sit Details (indicate cue type and reason): Pt required max A for bed mobility with total verbal cues for log rolling for pain management and lifting assistance to sit upright from sidelying. Transfers Transfers: Yes Stand Pivot Transfers: 2: Max assist Stand Pivot Transfer Details (indicate cue type and reason): Pt requires max A, lifting and controlled lowering assistance for multiple transfers bed <> BSC and bed <> w/c with verbal and tactile cues for anterior lean, initiation of sit <> stand and assistance to prevent posterior LOB. Locomotion  Ambulation Ambulation/Gait Assistance: 1: +2 Total assist Ambulation Distance (Feet): 25 Feet Ambulation/Gait Assistance Details: Performed gait with +2 HHA x 25' + 25' with pt initially very fearful of falling and reporting "shaking all over."  With encouragement pt able to ambulate in controlled enviornment with one sitting rest break.   Gait Gait: Yes Gait Pattern: Impaired Gait Pattern: Step-through pattern;Decreased stride length;Decreased trunk rotation;Narrow base of support;Decreased step length - right;Decreased step length - left (L foot externally  rotated and posterior lean) Stairs / Additional Locomotion Stairs: No (TBA in  pm) Wheelchair Mobility Wheelchair Mobility: Yes Wheelchair Assistance: 1: +1 Total assist Distance: 150  Trunk/Postural Assessment  Cervical Assessment Cervical Assessment: Within Functional Limits Thoracic Assessment Thoracic Assessment: Exceptions to [facility] (multiple compression fractures; pain) Lumbar Assessment Lumbar Assessment: Exceptions to [facility] (increased lordosis; pain) Postural Control Postural Control: Deficits on evaluation Righting Reactions: impaired/absent righting reactions in sitting and standing; presents with posterior and L lateral lean  Balance Balance Balance Assessed: Yes Static Sitting Balance Static Sitting - Balance Support: Right upper extremity supported;Left upper extremity supported;Feet supported Static Sitting - Level of Assistance: 4: Min assist Dynamic Sitting Balance Dynamic Sitting - Balance Support: Right upper extremity supported;Left upper extremity supported;Feet supported Dynamic Sitting - Level of Assistance: 3: Mod assist Static Standing Balance Static Standing - Balance Support: Right upper extremity supported;Left upper extremity supported Static Standing - Level of Assistance: 1: +2 Total assist Dynamic Standing Balance Dynamic Standing - Balance Support: Right upper extremity supported;Left upper extremity supported Dynamic Standing - Level of Assistance: 1: +2 Total assist Extremity Assessment  RLE Assessment RLE Assessment: Exceptions to [facility] RLE Strength RLE Overall Strength: Deficits;Due to pain RLE Overall Strength Comments: pain with resistance; overall 3-4/5  LLE Assessment LLE Assessment: Exceptions to [facility] LLE Strength LLE Overall Strength: Deficits;Due to pain LLE Overall Strength Comments: pain with resistance; overall 3-4/5   FIM:  FIM - Bed/Chair Transfer Bed/Chair Transfer Assistive Devices: Bed rails Bed/Chair Transfer: 2: Supine > Sit:  Max A (lifting assist/Pt. 25-49%);2: Bed > Chair or W/C: Max A (lift and lower assist);2: Chair or W/C > Bed: Max A (lift and lower assist) FIM - Locomotion: Wheelchair Distance: 150 Locomotion: Wheelchair: 1: Total Assistance/staff pushes wheelchair (Pt<25%) FIM - Locomotion: Ambulation Locomotion: Ambulation Assistive Devices: Other (comment) (bilat HHA) Ambulation/Gait Assistance: 1: +2 Total assist Locomotion: Ambulation: 1: Two helpers   Refer to Care Plan for Long Term Goals  Recommendations for other services: Neuropsych  Discharge Criteria: Patient will be discharged from PT if patient refuses treatment 3 consecutive times without medical reason, if treatment goals not met, if there is a change in medical status, if patient makes no progress towards goals or if patient is discharged from hospital.  The above assessment, treatment plan, treatment alternatives and goals were discussed and mutually agreed upon: by patient and by family  Malachy Mood 02/10/2014, 10:28 AM

## 2014-02-10 NOTE — Progress Notes (Signed)
Occupational Therapy Session Note  Patient Details  Name: LANNA LABELLA MRN: [id number] Date of Birth: November 11, 1920  Today's Date: 02/10/2014 Time: 7782-4235 Time Calculation (min): 45 min  Short Term Goals: Week 1:  OT Short Term Goal 1 (Week 1): Pt will be supervision grooming sitting at sink OT Short Term Goal 1 - Progress (Week 1): Progressing toward goal OT Short Term Goal 2 (Week 1): Pt will be Min A SPT to 3:1 using grab bars OT Short Term Goal 2 - Progress (Week 1): Progressing toward goal OT Short Term Goal 3 (Week 1): Pt will be Mod I sponge bathe sitting at sink w/ set up sit to stand (a/e PRN) OT Short Term Goal 3 - Progress (Week 1): Progressing toward goal OT Short Term Goal 4 (Week 1): Pt will be Mod A SPT shower transfer using grab bars & shower seat with back OT Short Term Goal 4 - Progress (Week 1): Progressing toward goal OT Short Term Goal 5 (Week 1): Pt will be Mod A simple meal/snack prep sit to stand in ADL kitchen OT Short Term Goal 5 - Progress (Week 1): Progressing toward goal  Skilled Therapeutic Interventions/Progress Updates:    Pt. Sitting in recliner.  Daughter present.  Performed functional mobility, transfers, standing balance.  Pt. Stood with min A with min instructional cues.  Ambulated with RW to toilet.  Transferred to toilet with minimal cues.  Pt. Had BM.  Pt. Stood with min assist.  Needed assist with peri care.  Ambulated to sink and performed grooming and hand hygiene.  Ambulated to ADL kitchen and back to room.  Pt. Sat in recliner.  She performed set up for dinner tray including opening can of coke.  Left in room with daughter present.    Therapy Documentation Precautions:  Precautions Precautions: Fall Precaution Comments: Notify physician for pulse less than 55 or greater than 120, respiratory rate less than 12 or greater than 25, temperature greater than 100.5 F, urinary output less than 30 mL/kg/hr for four hours, systolic BP less than 90 or  greater than 361, diastolic BP less than 60 or greater than 100.; monitor orthostatic vitals; peripheral neuropathy, HOH, deaf in L ear Restrictions Weight Bearing Restrictions: No     Pain: Pain Assessment Pain Assessment:  4/10  Back      See FIM for current functional status  Therapy/Group: Individual Therapy  Lisa Roca 02/10/2014, 5:27 PM

## 2014-02-10 NOTE — Evaluation (Signed)
Occupational Therapy Assessment and Plan  Patient Details  Name: Lindsay Sheppard MRN: [id number] Date of Birth: 07-Sep-1920  OT Diagnosis: acute pain, muscle weakness (generalized) and low back pain, decreased balance, endurance & activity tolerance Rehab Potential: Rehab Potential: Good ELOS: 10-12 days (10-12 days)   Today's Date: 02/10/2014 Time: 1108-1202 Time Calculation (min): 54 min  Problem List:  Patient Active Problem List   Diagnosis Date Noted  . 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 Supratherapeutic INR 02/09/2014  . Recurrent UTI 02/09/2014  . Fractures 02/09/2014  . Chronic constipation 02/08/2014  . Hyperkalemia 02/07/2014  . Fall 02/07/2014  . Compression fracture 02/07/2014  . Syncope 02/07/2014  . Encounter for therapeutic drug monitoring 09/27/2013  . Foot pain 09/27/2013  . Rectal pain 09/27/2013  . Hearing loss, right 09/27/2013  . Left foot pain 05/12/2013  . Left shoulder pain 03/22/2013  . Abdominal  pain, other specified site 06/23/2012  . Fever 03/24/2012  . Lumbar back pain 03/10/2012  . Gait disorder 03/10/2012  . Fatigue 09/04/2011  . 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 Preventative health care 11/29/2010  . Pulmonary embolism 10/14/2010  . Other specified forms of hearing loss 05/30/2010  . DIASTOLIC HEART FAILURE, CHRONIC 12/14/2009  . POSTHERPETIC NEURALGIA 11/02/2009  . ALLERGIC RHINITIS 11/02/2009  . RENAL INSUFFICIENCY 07/02/2009  . POSTURAL HYPOTENSION 02/07/2008  . SYNCOPE 02/07/2008  . DEPRESSIVE DISORDER 01/25/2008  . CONSTIPATION 01/25/2008  . DIABETES MELLITUS, TYPE II 09/06/2007  . RENAL CYST, LEFT 09/06/2007  . B12 DEFICIENCY 05/23/2007  . HYPERLIPIDEMIA 05/23/2007  . Atrial fibrillation 05/23/2007  . OSTEOPOROSIS 05/23/2007  . SHINGLES, HX OF 05/23/2007  . ANEMIA-IRON DEFICIENCY 01/26/2007  . HYPERTENSION 01/26/2007  . GERD 01/26/2007    Past Medical History:  Past Medical History  Diagnosis Date  .  POSTHERPETIC NEURALGIA 11/02/2009  . B12 DEFICIENCY 05/23/2007  . HYPERLIPIDEMIA 05/23/2007  . HYPERKALEMIA 05/30/2010  . ANEMIA-IRON DEFICIENCY 01/26/2007  . BLEPHARITIS, BILATERAL 02/23/2008  . Other specified forms of hearing loss 05/30/2010  . HYPERTENSION 01/26/2007  . Atrial fibrillation 05/23/2007  . DIASTOLIC HEART FAILURE, CHRONIC 12/14/2009  . POSTURAL HYPOTENSION 02/07/2008  . ALLERGIC RHINITIS 11/02/2009  . GERD 01/26/2007  . CONSTIPATION 01/25/2008  . SKIN LESION 05/30/2010  . BACK PAIN 08/02/2007  . OSTEOPOROSIS 05/23/2007  . SYNCOPE 02/07/2008  . FATIGUE 09/06/2007  . Dysuria 09/05/2008  . Abdominal pain, epigastric 09/06/2007  . EPIGASTRIC TENDERNESS 10/12/2007  . PULMONARY EMBOLISM, HX OF 05/23/2007  . SHINGLES, HX OF 05/23/2007  . 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 Left foot pain   . Diabetic neuropathy   . RENAL INSUFFICIENCY 07/02/2009  . RENAL CYST, LEFT 09/06/2007  . Kidney stones     "never had OR"  . Family history of anesthesia complication     "daughter has PONV; another daughter a little anesthesia lasts way too long"  . CHF (congestive heart failure) 2011  . Myocardial infarction 2011    "mild"  . DVT (deep venous thrombosis) ~ 1964    "think it was in the left"  . Aspiration pneumonia ~ 2006    "due to aspiration"  . DIABETES MELLITUS, TYPE II 09/06/2007  . Arthritis     "hands" (02/07/2014)  . DEPRESSIVE DISORDER 01/25/2008    "@ times; never treated for it"  . Melanoma of nose   . Compression fracture of lumbar vertebra     hx  . Compression fracture of thoracic vertebra 02/07/2014    "fell this am" (  02/07/2014   Past Surgical History:  Past Surgical History  Procedure Laterality Date  . Appendectomy  1943  . Abdominal hysterectomy  1964  . Cardiac electrophysiology mapping and ablation  1999  . Cataract extraction w/ intraocular lens  implant, bilateral  2003-2005  . Carpal tunnel release Right 2004  . Mohs surgery  2011    "removed off her nose"  . Cardiac  catheterization  10/2009  . Esophageal dilation  X 2    Assessment & Plan Clinical Impression: Lindsay Sheppard is a 80 y.o. right-handed female with history of HTN, DM type 2, postural hypotension, CAF--chronic coumadin, chronic back pain who fell while getting off the commode on 02/07/14 (question due to slipping or syncopal episode) and had onset of severe back pain. She was admitted for work up and Cranial CT scan negative for acute changes. Cardiac enzymes negative and 2D echocardiogram with ejection fraction of 55% and normal systolic function. CT of spine with cervical spondylolisthesis C6 on C7, multilevel compression fractures T3, T7, T11, T12, L2, severe DDD L4/L5 and old sacral fracture with new sclerotic lesion left sacral ala. Patient was transferred to CIR on 02/09/2014 .    Patient currently requires Min-max assist with basic self-care skills and IADL secondary to muscle weakness and decreased sitting balance, decreased standing balance and decreased balance strategies.  Prior to hospitalization, patient could complete basic ADL's and simple meal/snack prep with modified independence .  Patient will benefit from skilled intervention to increase independence with basic self-care skills and increase level of independence with iADL prior to discharge home with family/care partners and PRN asistance.  Anticipate patient will require 24 hour supervision and minimal physical assistance and follow up home health OT.  OT - End of Session Endurance Deficit: Yes Endurance Deficit Description: anxiety; generalized weakness & deconditioning.   OT Assessment Rehab Potential: Good OT Patient demonstrates impairments in the following area(s): Balance;Endurance;Motor;Pain;Safety;Other (Comment) (HOH - deaf in left ear) OT Basic ADL's Functional Problem(s): Grooming;Bathing;Dressing;Toileting OT Advanced ADL's Functional Problem(s): Simple Meal Preparation OT Transfers Functional Problem(s):  Toilet;Tub/Shower OT Additional Impairment(s): Other (comment) (Generalized weakness bilateral UE's w/ tremors noted and decreased fine motor coordination) OT Plan OT Intensity: Minimum of 1-2 x/day, 45 to 90 minutes OT Frequency: Total of 15 hours over 7 days OT Duration/Estimated Length of Stay: 10-12 days (10-12 days) OT Treatment/Interventions: Balance/vestibular training;Discharge planning;Pain management;Functional mobility training;DME/adaptive equipment instruction;Patient/family education;Psychosocial support;Self Care/advanced ADL retraining;UE/LE Coordination activities;UE/LE Strength taining/ROM;Therapeutic Activities OT Self Feeding Anticipated Outcome(s): Mod I OT Basic Self-Care Anticipated Outcome(s): Supervision-Min A OT Toileting Anticipated Outcome(s): Supervision OT Bathroom Transfers Anticipated Outcome(s): Min A OT Recommendation Patient destination: Home Follow Up Recommendations: Home health OT;24 hour supervision/assistance Equipment Recommended: To be determined;Other (comment) (Cont to assess )   Skilled Therapeutic Intervention Pt was seen for initial OT assessment followed by individual OT treatment session for ADL retraining w/ focus on functional transfers to 3:1 over toilet, sit to stand, endurance, activity tolerance, balance/static standing at sink during BADL's. Pt was Mod I PTA and also performed simple meal prep. She is deaf in her left ear and benefits from people speaking slowly and into her right. She was overall Max assist for toileting hygeine and clothing management but Mod A for SPT to the 3:1 given HHA. Max A for LB dressing. She will benefit from continued OT in rehab setting to assist with increasing level of independence with BADLs and for pt/family education. She was sitting in her w/c w/ call bell  and phone in reach, eating lunch. Daughter and granddaughter in room.  OT Evaluation Precautions/Restrictions  Precautions Precautions:  Fall Precaution Comments: Notify physician for pulse less than 55 or greater than 120, respiratory rate less than 12 or greater than 25, temperature greater than 100.5 F, urinary output less than 30 mL/kg/hr for four hours, systolic BP less than 90 or greater than 270, diastolic BP less than 60 or greater than 100.; monitor orthostatic vitals; peripheral neuropathy, HOH, deaf in L ear Restrictions Weight Bearing Restrictions: No General Chart Reviewed: Yes Vital Signs   Pain Pain Assessment Pain Assessment: 0-10 Pain Score: 3  Pain Type: Acute pain Pain Location: Back Pain Orientation: Left;Lower Pain Descriptors / Indicators: Aching;Sore Pain Frequency: Intermittent Pain Onset: With Activity Patients Stated Pain Goal: 2 Pain Intervention(s): Repositioned;RN made aware;Rest Multiple Pain Sites: No Home Living/Prior Functioning Home Living Available Help at Discharge: Family (5 children) Type of Home: House Home Access: Stairs to enter [employer] of Steps: 2 Entrance Stairs-Rails: Right;Left;Can reach both Home Layout: Two level;Able to live on main level with bedroom/bathroom Additional Comments: Lives with son. Other family members available who live [place] "farm"; pt uses BSC at night beside bed  Lives With: Son IADL History Homemaking Responsibilities: Yes Meal Prep Responsibility: Secondary (Makes simple meals (oatmeal, cereal, reheats items in microwave)) Laundry Responsibility: No Cleaning Responsibility: No Bill Paying/Finance Responsibility: No Shopping Responsibility: No Child Care Responsibility: No Current License: No Occupation: Retired Prior Function Level of Independence: Independent with gait;Independent with transfers;Requires assistive device for independence;Needs assistance with homemaking  Able to Take Stairs?: Yes (with rails and Min A) Driving: No Comments: Used RW inside PTA, bilat HHA outside.  Did some cooking but standing tolerance  was limited to 5-10 minutes secondary to back pain ADL ADL Eating: Set up Where Assessed-Eating: Wheelchair Vision/Perception  Vision- History Baseline Vision/History: No visual deficits Patient Visual Report: No change from baseline Vision- Assessment Vision Assessment?: No apparent visual deficits  Cognition Arousal/Alertness: Awake/alert Orientation Level: Oriented X4 Memory: Impaired Memory Impairment: Decreased recall of new information;Decreased short term memory Decreased Short Term Memory: Verbal basic;Verbal complex Sensation Sensation Light Touch: Impaired by gross assessment Stereognosis: Not tested Hot/Cold: Not tested Proprioception: Impaired by gross assessment Additional Comments: Secondary to peripheral neuropathy pt reporting impaired sensation in feet and impaired ability to tell where her feet are Coordination Gross Motor Movements are Fluid and Coordinated: No (Pt with tremors) Fine Motor Movements are Fluid and Coordinated: No Coordination and Movement Description:  (Pt able to perform fine motor tasks, however tremors, neuropathy limit ability to open certain containers during BADL's) Motor  Motor Motor: Abnormal postural alignment and control Motor - Skilled Clinical Observations: generalized weakness; sits and stands with significant posterior and L lateral lean Mobility  Bed Mobility Bed Mobility: Supine to Sit;Sit to Supine Supine to Sit: 2: Max assist;With rails;HOB flat Supine to Sit Details (indicate cue type and reason): Pt required max A for bed mobility with total verbal cues for log rolling for pain management and lifting assistance to sit upright from sidelying. Transfers Transfers: Sit to Stand;Stand to Sit Sit to Stand: From chair/3-in-1;From toilet;With upper extremity assist;With armrests;3: Mod assist Stand to Sit: 3: Mod assist;With armrests;With upper extremity assist;To chair/3-in-1;To toilet Stand to Sit Details (indicate cue type  and reason): Tactile cues for sequencing;Tactile cues for placement;Verbal cues for technique;Verbal cues for precautions/safety Stand to Sit Details: Verbal and tactile cues for safety, pt reaches back with R UE prior to sitting, however  is then not able to sit safely due to left side being off to side of chair, 3:1/toilet and/or w/c. This was noted multiple times today.  Trunk/Postural Assessment  Cervical Assessment Cervical Assessment: Within Functional Limits Thoracic Assessment Thoracic Assessment: Exceptions to [facility] (multiple compression fractures; pain) Lumbar Assessment Lumbar Assessment: Exceptions to [facility] (increased lordosis; pain) Postural Control Postural Control: Deficits on evaluation Righting Reactions: impaired/absent righting reactions in sitting and standing; presents with posterior and L lateral lean  Balance Balance Balance Assessed: Yes Static Sitting Balance Static Sitting - Balance Support: Right upper extremity supported;Left upper extremity supported;Feet supported Static Sitting - Level of Assistance: 4: Min assist Dynamic Sitting Balance Dynamic Sitting - Balance Support: Right upper extremity supported;Left upper extremity supported;Feet supported Dynamic Sitting - Level of Assistance: 3: Mod assist Static Standing Balance Static Standing - Balance Support: Right upper extremity supported;Left upper extremity supported Static Standing - Level of Assistance: 3: Mod assist Static Standing - Comment/# of Minutes:  (Static standing at sink for assist with peri care/bathing buttocks & dressing ~ 1-59min 2-3 times) Dynamic Standing Balance Dynamic Standing - Balance Support: Right upper extremity supported;Left upper extremity supported Dynamic Standing - Level of Assistance: +2 Extremity/Trunk Assessment RUE Assessment RUE Assessment:  (Generalized weakness) LUE Assessment LUE Assessment:  (Generalized weakness)  FIM:  FIM - Grooming Grooming Steps: Wash,  rinse, dry face;Wash, rinse, dry hands;Oral care, brush teeth, clean dentures;Brush, comb hair Grooming: 4: Steadying assist  or patient completes 3 of 4 or 4 of 5 steps FIM - Bathing Bathing Steps Patient Completed: Chest;Right Arm;Left Arm;Abdomen;Right upper leg;Left upper leg;Front perineal area Bathing: 3: Mod-Patient completes 5-7 20f 10 parts or 50-74% FIM - Upper Body Dressing/Undressing Upper body dressing/undressing steps patient completed: Thread/unthread right sleeve of pullover shirt/dresss;Thread/unthread left sleeve of pullover shirt/dress;Put head through opening of pull over shirt/dress;Pull shirt over trunk Upper body dressing/undressing: 3: Mod-Patient completed 50-74% of tasks FIM - Lower Body Dressing/Undressing Lower body dressing/undressing steps patient completed: Thread/unthread right underwear leg;Thread/unthread left underwear leg;Thread/unthread right pants leg;Thread/unthread left pants leg Lower body dressing/undressing: 2: Max-Patient completed 25-49% of tasks FIM - Toileting Toileting steps completed by patient: Performs perineal hygiene Toileting: 2: Max-Patient completed 1 of 3 steps FIM - Bed/Chair Transfer Bed/Chair Transfer Assistive Devices: Bed rails Bed/Chair Transfer: 2: Supine > Sit: Max A (lifting assist/Pt. 25-49%);2: Bed > Chair or W/C: Max A (lift and lower assist);2: Chair or W/C > Bed: Max A (lift and lower assist) FIM - Radio producer Devices: Bedside commode;Grab bars Toilet Transfers: 2-To toilet/BSC: Max A (lift and lower assist);2-From toilet/BSC: Max A (lift and lower assist) FIM - Tub/Shower Transfers Tub/shower Transfers: 0-Activity did not occur or was simulated   Refer to Care Plan for Long Term Goals  Recommendations for other services: None  Discharge Criteria: Patient will be discharged from OT if patient refuses treatment 3 consecutive times without medical reason, if treatment goals not met, if there  is a change in medical status, if patient makes no progress towards goals or if patient is discharged from hospital.  The above assessment, treatment plan, treatment alternatives and goals were discussed and mutually agreed upon: by patient and by family  Almyra Deforest 02/10/2014, 12:57 PM

## 2014-02-10 NOTE — Progress Notes (Signed)
ANTICOAGULATION CONSULT NOTE - Initial Consult  Pharmacy Consult for Lovenox  Indication: h/o  atrial fibrillation with subtherapeutic INR on chronic coumadin  Allergies  Allergen Reactions  . Amiodarone     REACTION: f? fluid retention  . Deltasone [Prednisone]   . Diazepam     REACTION: agitation  . Diltiazem Hcl   . Levofloxacin     REACTION: nausea  . Pregabalin     REACTION: hallucinations  . Spironolactone     REACTION: elevated K at lowest dose  . Tequin [Gatifloxacin]     Patient Measurements: Height: 5\' 6"  (167.6 cm) Weight: 126 lb 8.7 oz (57.4 kg) IBW/kg (Calculated) : 59.3   Vital Signs: Temp: 97.8 F (36.6 C) (06/12 0500) Temp src: Oral (06/12 0500) BP: 168/88 mmHg (06/12 0500) Pulse Rate: 76 (06/12 0500)  Labs:  Recent Labs  02/07/14 1706 02/07/14 2244 02/08/14 0420 02/09/14 0427 02/10/14 0705  HGB  --   --   --   --  11.6*  HCT  --   --   --   --  36.2  PLT  --   --   --   --  123*  LABPROT  --   --  34.8* 42.8* 18.3*  INR  --   --  3.63* 4.76* 1.57*  CREATININE  --   --   --  1.69* 1.54*  TROPONINI <0.30 <0.30 <0.30  --   --     Estimated Creatinine Clearance: 22 ml/min (by C-G formula based on Cr of 1.54).   Medical History: Past Medical History  Diagnosis Date  . POSTHERPETIC NEURALGIA 11/02/2009  . B12 DEFICIENCY 05/23/2007  . HYPERLIPIDEMIA 05/23/2007  . HYPERKALEMIA 05/30/2010  . ANEMIA-IRON DEFICIENCY 01/26/2007  . BLEPHARITIS, BILATERAL 02/23/2008  . Other specified forms of hearing loss 05/30/2010  . HYPERTENSION 01/26/2007  . Atrial fibrillation 05/23/2007  . DIASTOLIC HEART FAILURE, CHRONIC 12/14/2009  . POSTURAL HYPOTENSION 02/07/2008  . ALLERGIC RHINITIS 11/02/2009  . GERD 01/26/2007  . CONSTIPATION 01/25/2008  . SKIN LESION 05/30/2010  . BACK PAIN 08/02/2007  . OSTEOPOROSIS 05/23/2007  . SYNCOPE 02/07/2008  . FATIGUE 09/06/2007  . Dysuria 09/05/2008  . Abdominal pain, epigastric 09/06/2007  . EPIGASTRIC TENDERNESS 10/12/2007  . PULMONARY  EMBOLISM, HX OF 05/23/2007  . SHINGLES, HX OF 05/23/2007  . 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 Left foot pain   . Diabetic neuropathy   . RENAL INSUFFICIENCY 07/02/2009  . RENAL CYST, LEFT 09/06/2007  . Kidney stones     "never had OR"  . Family history of anesthesia complication     "daughter has PONV; another daughter a little anesthesia lasts way too long"  . CHF (congestive heart failure) 2011  . Myocardial infarction 2011    "mild"  . DVT (deep venous thrombosis) ~ 1964    "think it was in the left"  . Aspiration pneumonia ~ 2006    "due to aspiration"  . DIABETES MELLITUS, TYPE II 09/06/2007  . Arthritis     "hands" (02/07/2014)  . DEPRESSIVE DISORDER 01/25/2008    "@ times; never treated for it"  . Melanoma of nose   . Compression fracture of lumbar vertebra     hx  . Compression fracture of thoracic vertebra 02/07/2014    "fell this am" (02/07/2014    Medications:  Prescriptions prior to admission  Medication Sig Dispense Refill  . acetaminophen (TYLENOL) 325 MG tablet Take 2 tablets (650 mg total) by mouth 3 (three) times daily.      Marland Kitchen  bisacodyl (DULCOLAX) 10 MG suppository Place 1 suppository (10 mg total) rectally daily as needed for moderate constipation.  12 suppository  0  . cloNIDine (CATAPRES - DOSED IN MG/24 HR) 0.3 mg/24hr patch Place 1 patch (0.3 mg total) onto the skin every 7 (seven) days.  12 patch  5  . Cyanocobalamin (VITAMIN B-12 IJ) Inject as directed every 30 (thirty) days.      . ferrous sulfate 325 (65 FE) MG tablet Take 1 tablet (325 mg total) by mouth daily with breakfast.    3  . fexofenadine (ALLEGRA) 180 MG tablet Take 180 mg by mouth every evening.       . fluticasone (FLONASE) 50 MCG/ACT nasal spray Place 2 sprays into the nose daily.  16 g  2  . Hypromellose (ARTIFICIAL TEARS OP) Place 1 drop into both eyes daily.      . insulin glargine (LANTUS) 100 UNIT/ML injection Inject 0.1 mLs (10 Units total) into the skin daily. Inject 7 units subcutaneously  once a day  10 mL  11  . labetalol (NORMODYNE) 200 MG tablet Take 400 mg by mouth 2 (two) times daily.      Marland Kitchen oxyCODONE (OXY IR/ROXICODONE) 5 MG immediate release tablet Take 1 tablet (5 mg total) by mouth every 4 (four) hours as needed for moderate pain.  30 tablet  0  . pantoprazole (PROTONIX) 40 MG tablet Take 1 tablet (40 mg total) by mouth daily.  90 tablet  3  . polyethylene glycol (MIRALAX / GLYCOLAX) packet Take 17 g by mouth daily.      . pravastatin (PRAVACHOL) 40 MG tablet Take 0.5 tablets (20 mg total) by mouth daily. 1/2 daily  45 tablet  3  . senna (SENOKOT) 8.6 MG TABS tablet Take 2 tablets (17.2 mg total) by mouth daily.  120 each  0  . sodium phosphate (FLEET) 7-19 GM/118ML ENEM Place 133 mLs (1 enema total) rectally daily as needed for severe constipation.    0  . sulfamethoxazole-trimethoprim (BACTRIM DS,SEPTRA DS) 800-160 MG per tablet Take 1 tablet by mouth daily.  90 tablet  3  . warfarin (COUMADIN) 1 MG tablet Hold until INR less than 3.0, then resume at 1 mg daily or per pharmacy       Scheduled:  . acetaminophen  650 mg Oral TID  . [START ON 02/14/2014] cloNIDine  0.3 mg Transdermal Q7 days  . enoxaparin (LOVENOX) injection  50 mg Subcutaneous Q24H  . ferrous sulfate  325 mg Oral BID WC  . fluticasone  2 spray Each Nare Daily  . insulin aspart  0-9 Units Subcutaneous TID WC  . insulin glargine  10 Units Subcutaneous Daily  . labetalol  400 mg Oral BID  . loratadine  10 mg Oral Daily  . MUSCLE RUB  1 application Topical BID  . pantoprazole  40 mg Oral Daily  . polyethylene glycol  17 g Oral BID  . senna  2 tablet Oral Daily  . simvastatin  10 mg Oral q1800  . warfarin  2.5 mg Oral ONCE-1800  . Warfarin - Pharmacist Dosing Inpatient   Does not apply q1800    Assessment: Algis Liming, PA wants Lovenox treatment dose until INR =/> 2.0 for this 80 y.o female whose INR is currently subtherapeutic (1.57) after vitamin K 2.5 mg po given yesterday for high INR of 4.76 No  bleeding noted. Patient is not in NSR currently. As discussed with Algis Liming, PA , she wants lowest dose of  lovenox possible for afib due to patient's age and risk of bleeding.    Goal of Therapy:  INR 2-3 Monitor platelets by anticoagulation protocol: Yes   Plan:  Lovenox 50 mg SQ q24h  (~ 1mg /kg q24h) until INR =/>2.0. CBC q72h See previous note for coumadin plan. INR daily.  Nicole Cella, RPh Clinical Pharmacist Pager: [phone] 02/10/2014,10:29 AM

## 2014-02-10 NOTE — Progress Notes (Signed)
ANTICOAGULATION CONSULT NOTE - Follow Up Consult  Pharmacy Consult for coumadin Indication: atrial fibrillation  Allergies  Allergen Reactions  . Amiodarone     REACTION: f? fluid retention  . Deltasone [Prednisone]   . Diazepam     REACTION: agitation  . Diltiazem Hcl   . Levofloxacin     REACTION: nausea  . Pregabalin     REACTION: hallucinations  . Spironolactone     REACTION: elevated K at lowest dose  . Tequin [Gatifloxacin]     Patient Measurements: Height: 5\' 6"  (167.6 cm) Weight: 126 lb 8.7 oz (57.4 kg) IBW/kg (Calculated) : 59.3   Vital Signs: Temp: 97.8 F (36.6 C) (06/12 0500) Temp src: Oral (06/12 0500) BP: 168/88 mmHg (06/12 0500) Pulse Rate: 76 (06/12 0500)  Labs:  Recent Labs  02/07/14 1706 02/07/14 2244 02/08/14 0420 02/09/14 0427 02/10/14 0705  HGB  --   --   --   --  11.6*  HCT  --   --   --   --  36.2  PLT  --   --   --   --  123*  LABPROT  --   --  34.8* 42.8* 18.3*  INR  --   --  3.63* 4.76* 1.57*  CREATININE  --   --   --  1.69* 1.54*  TROPONINI <0.30 <0.30 <0.30  --   --     Estimated Creatinine Clearance: 22 ml/min (by C-G formula based on Cr of 1.54).  Assessment: Anticoagulation: Pt on coumadin PTA for Afib. INR = 1.57 today after Vitamin K 2.5 mg PO x given yesterday due to supratherapeutic INR of 4.76 yesterday.  INR was THERAPEUTIC on hospitial admit date 6/9. INT trend 2.67>>3.63>>4.76>>vitamin K given >>1.57.  Patient was also taking suppression therapy with bactrim PTA for UTI prophylaxis (h/o recurrent UTI) and bactrim  continued on admission.  Her daughter said her mom has been on bactrim for a couple of years. Her baseline INR is 2.67 on home dose of 2.5mg  daily. On 02/09/14 the pharmacist discussed with Dr. Jenny Reichmann the potential of significant drug -drug interaction with bactrim and coumadin (increase coumadin effect).  Dr. Jenny Reichmann discontinued the bactrim on 02/09/14.  INR is now SUBtherapeutic after Vitamin K. Coumadin per pharmacy  consult continued for h/o Afib.   Patient was admitted to [facility] on 02/07/14 s/p fall with therapeutic INR.  CT head 02/07/14 No acute intracranial abnormality, no hemorrhage.  Iron deficiency anemia: Hgb 11.6 <12.8, PLTC 123 <146K. Continues on  iron supplement bid.  No bleeding noted.    Goal of Therapy:  INR 2-3    Plan:  Coumadin 2.5 mg  Today  (now off bactrim and post Vitamin K) INR qAM.    Nicole Cella, RPh Clinical Pharmacist Pager: [phone] 02/10/2014,9:18 AM

## 2014-02-11 ENCOUNTER — Inpatient Hospital Stay (HOSPITAL_COMMUNITY): Payer: Medicare Other | Admitting: *Deleted

## 2014-02-11 LAB — GLUCOSE, CAPILLARY
GLUCOSE-CAPILLARY: 278 mg/dL — AB (ref 70–99)
Glucose-Capillary: 173 mg/dL — ABNORMAL HIGH (ref 70–99)
Glucose-Capillary: 224 mg/dL — ABNORMAL HIGH (ref 70–99)
Glucose-Capillary: 207 mg/dL — ABNORMAL HIGH (ref 70–99)

## 2014-02-11 LAB — PROTIME-INR
INR: 1.23 (ref 0.00–1.49)
Prothrombin Time: 15.2 seconds (ref 11.6–15.2)

## 2014-02-11 MED ORDER — WARFARIN SODIUM 4 MG PO TABS
4.0000 mg | ORAL_TABLET | Freq: Once | ORAL | Status: AC
  Administered 2014-02-11: 4 mg via ORAL
  Filled 2014-02-11: qty 1

## 2014-02-11 NOTE — IPOC Note (Signed)
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)  SLP    TR         Basic ADL's: OT Grooming;Bathing;Dressing;Toileting     Advanced  ADL's: OT Simple Meal Preparation     Transfers: PT Bed Mobility;Bed to Chair;Car;Furniture  OT Toilet;Tub/Shower     Locomotion: PT Ambulation;Stairs     Additional Impairments: OT Other (comment) (Generalized weakness bilateral UE's w/ tremors noted and decreased fine motor coordination)  SLP        TR      Anticipated Outcomes Item Anticipated Outcome  Self Feeding Mod I  Swallowing      Basic self-care  Supervision-Min A  Toileting  Supervision   Bathroom Transfers Min A  Bowel/Bladder  Min A  Transfers  Supervision  Locomotion  Supervision with RW for inside; bilat HHA outside  Communication     Cognition     Pain  <4 on a 0-10 scale  Safety/Judgment  Mod I   Therapy Plan: PT Intensity: Minimum of 1-2 x/day ,45 to 90 minutes PT Frequency: 5 out of 7 days PT Duration Estimated Length of Stay: 10-12 days OT Intensity: Minimum of 1-2 x/day, 45 to 90 minutes OT Frequency: Total of 15 hours over 7 days OT Duration/Estimated Length of Stay: 10-12 days (10-12 days)         Team Interventions: Nursing Interventions Patient/Family Education;Disease Management/Prevention;Pain Management;Medication Management;Skin Care/Wound Management;Discharge Planning;Psychosocial Support  PT interventions Ambulation/gait training;Balance/vestibular training;Discharge planning;DME/adaptive equipment instruction;Functional  mobility training;Neuromuscular re-education;Pain management;Patient/family education;Psychosocial support;Stair training;Therapeutic Activities;Therapeutic Exercise;UE/LE Strength taining/ROM  OT Interventions Balance/vestibular training;Discharge planning;Pain management;Functional mobility training;DME/adaptive equipment instruction;Patient/family education;Psychosocial support;Self Care/advanced ADL retraining;UE/LE Coordination activities;UE/LE Strength taining/ROM;Therapeutic Activities  SLP Interventions    TR Interventions    SW/CM Interventions      Team Discharge Planning: Destination: PT-Home ,OT- Home , SLP-  Projected Follow-up: PT-Home health PT, OT-  Home health OT;24 hour supervision/assistance, SLP-  Projected Equipment Needs: PT-Other (comment), OT- To be determined;Other (comment) (Cont to assess ), SLP-  Equipment Details: PT-Transport w/c, OT-  Patient/family involved in discharge planning: PT- Patient;Family member/caregiver,  OT-Patient;Family member/caregiver, SLP-   MD ELOS: 10-12 days Medical Rehab Prognosis:  Excellent Assessment: The patient has been admitted for CIR therapies with the diagnosis of lumbar and sacral compression fx's. The team will be addressing functional mobility, strength, stamina, balance, safety, adaptive techniques and equipment, self-care, bowel and bladder mgt, patient and caregiver education, pain mgt, activity tolerance, leisure skills. Goals have been set at supervision to min assist with self-care and mobility.    Meredith Staggers, MD, FAAPMR      See Team Conference Notes for weekly updates to the plan of care

## 2014-02-11 NOTE — Progress Notes (Signed)
Subjective/Complaints: 80 y.o. right-handed female with history of HTN, DM type 2, postural hypotension, CAF--chronic coumadin, chronic back pain who fell while getting of the commode on 02/07/14 (question due to slipping or syncopal episode) and had onset of severe back pain. She was admitted for work up and Cranial CT scan negative for acute changes. Cardiac enzymes negative and 2D echocardiogram with ejection fraction of 55% and normal systolic function. CT of spine with cervical spondylolisthesis C6 on C7, multilevel compression fractures T3, T7, T11, T12, L2, severe DDD L4/L5 and old sacral fracture with new sclerotic lesion left sacral ala  C/o diet not allowed to have biscuits Review of Systems - Negative except low back pain and leg shakiness during therapy Objective: Vital Signs: Blood pressure 155/77, pulse 92, temperature 97 F (36.1 C), temperature source Oral, resp. rate 16, height $RemoveBe'5\' 6"'SLFBnRhLU$  (1.676 m), weight 56.246 kg (124 lb), SpO2 99.00%. No results found. Results for orders placed during the hospital encounter of 02/09/14 (from the past 72 hour(s))  GLUCOSE, CAPILLARY     Status: Abnormal   Collection Time    02/09/14  5:10 PM      Result Value Ref Range   Glucose-Capillary 299 (*) 70 - 99 mg/dL   Comment 1 Notify RN    GLUCOSE, CAPILLARY     Status: Abnormal   Collection Time    02/09/14  8:52 PM      Result Value Ref Range   Glucose-Capillary 128 (*) 70 - 99 mg/dL  CBC WITH DIFFERENTIAL     Status: Abnormal   Collection Time    02/10/14  7:05 AM      Result Value Ref Range   WBC 5.1  4.0 - 10.5 K/uL   RBC 4.02  3.87 - 5.11 MIL/uL   Hemoglobin 11.6 (*) 12.0 - 15.0 g/dL   HCT 36.2  36.0 - 46.0 %   MCV 90.0  78.0 - 100.0 fL   MCH 28.9  26.0 - 34.0 pg   MCHC 32.0  30.0 - 36.0 g/dL   RDW 16.1 (*) 11.5 - 15.5 %   Platelets 123 (*) 150 - 400 K/uL   Neutrophils Relative % 73  43 - 77 %   Neutro Abs 3.7  1.7 - 7.7 K/uL   Lymphocytes Relative 15  12 - 46 %   Lymphs Abs  0.8  0.7 - 4.0 K/uL   Monocytes Relative 9  3 - 12 %   Monocytes Absolute 0.5  0.1 - 1.0 K/uL   Eosinophils Relative 3  0 - 5 %   Eosinophils Absolute 0.1  0.0 - 0.7 K/uL   Basophils Relative 0  0 - 1 %   Basophils Absolute 0.0  0.0 - 0.1 K/uL  COMPREHENSIVE METABOLIC PANEL     Status: Abnormal   Collection Time    02/10/14  7:05 AM      Result Value Ref Range   Sodium 140  137 - 147 mEq/L   Potassium 4.8  3.7 - 5.3 mEq/L   Chloride 104  96 - 112 mEq/L   CO2 23  19 - 32 mEq/L   Glucose, Bld 197 (*) 70 - 99 mg/dL   BUN 42 (*) 6 - 23 mg/dL   Creatinine, Ser 1.54 (*) 0.50 - 1.10 mg/dL   Calcium 9.5  8.4 - 10.5 mg/dL   Total Protein 5.7 (*) 6.0 - 8.3 g/dL   Albumin 3.2 (*) 3.5 - 5.2 g/dL   AST 13  0 -  37 U/L   ALT 10  0 - 35 U/L   Alkaline Phosphatase 55  39 - 117 U/L   Total Bilirubin 0.3  0.3 - 1.2 mg/dL   GFR calc non Af Amer 29 (*) >90 mL/min   GFR calc Af Amer 33 (*) >90 mL/min   Comment: (NOTE)     The eGFR has been calculated using the CKD EPI equation.     This calculation has not been validated in all clinical situations.     eGFR's persistently <90 mL/min signify possible Chronic Kidney     Disease.  PROTIME-INR     Status: Abnormal   Collection Time    02/10/14  7:05 AM      Result Value Ref Range   Prothrombin Time 18.3 (*) 11.6 - 15.2 seconds   INR 1.57 (*) 0.00 - 1.49  GLUCOSE, CAPILLARY     Status: Abnormal   Collection Time    02/10/14  7:22 AM      Result Value Ref Range   Glucose-Capillary 178 (*) 70 - 99 mg/dL   Comment 1 Notify RN    GLUCOSE, CAPILLARY     Status: Abnormal   Collection Time    02/10/14 11:32 AM      Result Value Ref Range   Glucose-Capillary 222 (*) 70 - 99 mg/dL   Comment 1 Notify RN    GLUCOSE, CAPILLARY     Status: Abnormal   Collection Time    02/10/14  6:11 PM      Result Value Ref Range   Glucose-Capillary 287 (*) 70 - 99 mg/dL   Comment 1 Notify RN    GLUCOSE, CAPILLARY     Status: Abnormal   Collection Time    02/10/14   9:56 PM      Result Value Ref Range   Glucose-Capillary 158 (*) 70 - 99 mg/dL  PROTIME-INR     Status: None   Collection Time    02/11/14  3:02 AM      Result Value Ref Range   Prothrombin Time 15.2  11.6 - 15.2 seconds   INR 1.23  0.00 - 1.49  GLUCOSE, CAPILLARY     Status: Abnormal   Collection Time    02/11/14  7:43 AM      Result Value Ref Range   Glucose-Capillary 173 (*) 70 - 99 mg/dL   Comment 1 Notify RN        Physical Exam  Constitutional: She is oriented to person, place, and time.  HENT:  Head: Normocephalic.  Eyes: EOM are normal.  Neck: Normal range of motion. Neck supple. No thyromegaly present.  Cardiovascular:  Cardiac rate controlled  Respiratory: Effort normal and breath sounds normal. No respiratory distress.  GI: Soft. Bowel sounds are normal. She exhibits no distension.  Neurological: She is alert and oriented to person, place, and time.  Patient makes good eye contact with examiner. HOH. Reasonable insight and awareness. Moves all 4's but proximal LE's limited by pain. UE's 5/5. HF 2+, KE 3+, ADF/APF 4/5. Stocking glove sensory loss to above the calves.  Skin: Skin is warm and dry.  Psychiatric: She has a normal mood and affect. Her behavior is normal. Thought content normal.    Assessment/Plan: 1. Functional deficits secondary to Fall resulting in lumbar and thoracic compression fractures which require 3+ hours per day of interdisciplinary therapy in a comprehensive inpatient rehab setting. Physiatrist is providing close team supervision and 24 hour management of active medical problems  listed below. Physiatrist and rehab team continue to assess barriers to discharge/monitor patient progress toward functional and medical goals. FIM: FIM - Bathing Bathing Steps Patient Completed: Chest;Right Arm;Left Arm;Abdomen;Right upper leg;Left upper leg;Front perineal area Bathing: 3: Mod-Patient completes 5-7 76f 10 parts or 50-74%  FIM - Upper Body  Dressing/Undressing Upper body dressing/undressing steps patient completed: Thread/unthread right sleeve of pullover shirt/dresss;Thread/unthread left sleeve of pullover shirt/dress;Put head through opening of pull over shirt/dress;Pull shirt over trunk Upper body dressing/undressing: 3: Mod-Patient completed 50-74% of tasks FIM - Lower Body Dressing/Undressing Lower body dressing/undressing steps patient completed: Thread/unthread right underwear leg;Thread/unthread left underwear leg;Thread/unthread right pants leg;Thread/unthread left pants leg Lower body dressing/undressing: 2: Max-Patient completed 25-49% of tasks  FIM - Toileting Toileting steps completed by patient: Adjust clothing prior to toileting;Performs perineal hygiene;Adjust clothing after toileting Toileting: 3: Mod-Patient completed 2 of 3 steps  FIM - Radio producer Devices: Bedside commode;Grab bars Toilet Transfers: 2-To toilet/BSC: Max A (lift and lower assist);2-From toilet/BSC: Max A (lift and lower assist)  FIM - Control and instrumentation engineer Devices: Walker;Arm rests Bed/Chair Transfer: 3: Chair or W/C > Bed: Mod A (lift or lower assist);3: Bed > Chair or W/C: Mod A (lift or lower assist)  FIM - Locomotion: Wheelchair Distance: 150 Locomotion: Wheelchair: 1: Total Assistance/staff pushes wheelchair (Pt<25%) FIM - Locomotion: Ambulation Locomotion: Ambulation Assistive Devices: Administrator Ambulation/Gait Assistance: 4: Min assist Locomotion: Ambulation: 2: Travels 50 - 149 ft with minimal assistance (Pt.>75%)  Comprehension Comprehension Mode: Auditory Comprehension: 5-Follows basic conversation/direction: With extra time/assistive device  Expression Expression Mode: Verbal Expression: 5-Expresses basic needs/ideas: With extra time/assistive device  Social Interaction Social Interaction: 4-Interacts appropriately 75 - 89% of the time - Needs redirection  for appropriate language or to initiate interaction.  Problem Solving Problem Solving: 4-Solves basic 75 - 89% of the time/requires cueing 10 - 24% of the time  Memory Memory: 4-Recognizes or recalls 75 - 89% of the time/requires cueing 10 - 24% of the time   Medical Problem List and Plan:  1. Functional deficits secondary to lumbar compression and sacral fxs (acute on chronic) exacerbated by recent fall  2. DVT Prophylaxis/Anticoagulation: Pharmaceutical: Coumadin  3. Pain Management: Will continue tylenol 650 mg tid. Ice and/or heat prn for comfort. Oxycodone prn for severe pain.  4. Mood: LCSW to follow for evaluation and support.  5. Neuropsych: This patient is capable of making decisions on her own behalf.  6. DM type 2: Will monitor with ac/hs checks. Continue Lantus 10 units daiy with SSI for elevated BS.  7. HTN: Monitor every 8 hours. Continue catapres TTS 3 patch and labetalol bid.  8. Chronic constipation: Miralax bid as well as senna daily to avoid recurrent issues with constipation and rectal pain. Limit narcotics.  9. CAF: Monitor HR every 8 hours. On coumadin with INR closely monitored by PharmD  10. Iron deficiency anemia: Continue iron supplement bid.  11. Chronic diastolic dysfunction: check daily weights. Low salt diet. Monitor for signs of overload. Continue to hold lasix for now.  12. CKD: baseline Cr-1.7. BUN with mild elevation. Encourage po fluids..  13. H/o orthostatic hypotension/syncope: Question vasovagal reaction--check orthostatic BP/pulse and not to be left alone while in BR.  14.  Soft diet but no history of dysphagia, adjust restrictions LOS (Days) 2 A FACE TO FACE EVALUATION WAS PERFORMED  KIRSTEINS,ANDREW E 02/11/2014, 9:53 AM

## 2014-02-11 NOTE — Progress Notes (Signed)
Physical Therapy Session Note  Patient Details  Name: Lindsay Sheppard MRN: [id number] Date of Birth: 04-11-1921  Today's Date: 02/11/2014 Time: 1062-6948 Time Calculation (min): 37 min   Skilled Therapeutic Interventions/Progress Updates:  Patient in a recliner at the beginning of the session, complains of being sore and tired from yesterdays session. With increased motivation patient agrees to participate in therapy. NuStep 5 min with multiple rest breaks and stops to reposition feet on plates. Gait training 1 x 100 feet 1 x 75 feet with min A ,usinf RW as AD. Multiple sit to stands and transfer training with min A. Patient returned to room, wanted to sit in a recliner, all needs within reach.  Therapy Documentation Precautions:  Precautions Precautions: Fall Precaution Comments: Notify physician for pulse less than 55 or greater than 120, respiratory rate less than 12 or greater than 25, temperature greater than 100.5 F, urinary output less than 30 mL/kg/hr for four hours, systolic BP less than 90 or greater than 546, diastolic BP less than 60 or greater than 100.; monitor orthostatic vitals; peripheral neuropathy, HOH, deaf in L ear Restrictions Weight Bearing Restrictions: No Pain: Pain Assessment Pain Assessment: 0-10 Pain Score: 7  Pain Type: Acute pain Pain Location: Back Pain Orientation: Mid Pain Descriptors / Indicators: Aching Pain Frequency: Intermittent Pain Onset: On-going Patients Stated Pain Goal: 2 Pain Intervention(s): Medication (See eMAR);Repositioned Multiple Pain Sites: No   See FIM for current functional status  Therapy/Group: Individual Therapy  Guadlupe Spanish 02/11/2014, 2:39 PM

## 2014-02-11 NOTE — Progress Notes (Signed)
ANTICOAGULATION CONSULT NOTE - Follow Up Consult  Pharmacy Consult for coumadin/lovenox Indication: atrial fibrillation  Allergies  Allergen Reactions  . Amiodarone     REACTION: f? fluid retention  . Deltasone [Prednisone]   . Diazepam     REACTION: agitation  . Diltiazem Hcl   . Levofloxacin     REACTION: nausea  . Pregabalin     REACTION: hallucinations  . Spironolactone     REACTION: elevated K at lowest dose  . Tequin [Gatifloxacin]     Patient Measurements: Height: 5\' 6"  (167.6 cm) Weight: 124 lb (56.246 kg) IBW/kg (Calculated) : 59.3   Vital Signs: Temp: 97 F (36.1 C) (06/13 0526) Temp src: Oral (06/13 0526)  Labs:  Recent Labs  02/09/14 0427 02/10/14 0705 02/11/14 0302  HGB  --  11.6*  --   HCT  --  36.2  --   PLT  --  123*  --   LABPROT 42.8* 18.3* 15.2  INR 4.76* 1.57* 1.23  CREATININE 1.69* 1.54*  --     Estimated Creatinine Clearance: 21.5 ml/min (by C-G formula based on Cr of 1.54).  Assessment: Anticoagulation: Pt on coumadin PTA for Afib. INR = 1.23 today after Vitamin K 2.5 mg PO x given yesterday due to supratherapeutic INR of 4.76 yesterday.  INR was THERAPEUTIC on hospitial admit date 6/9.  Patient was also taking suppression therapy with bactrim PTA for UTI prophylaxis (h/o recurrent UTI) and bactrim  continued on admission.  Her daughter said her mom has been on bactrim for a couple of years. Her baseline INR is 2.67 on home dose of 2.5mg  daily. On 02/09/14 the pharmacist discussed with Dr. Jenny Reichmann the potential of significant drug -drug interaction with bactrim and coumadin (increase coumadin effect).  Dr. Jenny Reichmann discontinued the bactrim on 02/09/14.  INR is now SUBtherapeutic after Vitamin K. Coumadin per pharmacy consult continued for h/o Afib. Also on lovenox while INR subtherpaeutic   Patient was admitted to [facility] on 02/07/14 s/p fall with therapeutic INR.  CT head 02/07/14 No acute intracranial abnormality, no hemorrhage.  Iron deficiency  anemia: Continues on  iron supplement bid.  No bleeding noted.    Goal of Therapy:  INR 2-3    Plan:  Coumadin 4 mg today  (now off bactrim and post Vitamin K) INR qAM.  Continue lovenox   Excell Seltzer Clinical Pharmacist Pager: [phone] 02/11/2014,2:43 PM

## 2014-02-12 ENCOUNTER — Inpatient Hospital Stay (HOSPITAL_COMMUNITY): Payer: Medicare Other

## 2014-02-12 ENCOUNTER — Inpatient Hospital Stay (HOSPITAL_COMMUNITY): Payer: Medicare Other | Admitting: *Deleted

## 2014-02-12 ENCOUNTER — Encounter (HOSPITAL_COMMUNITY): Payer: Medicare Other | Admitting: Occupational Therapy

## 2014-02-12 LAB — GLUCOSE, CAPILLARY
GLUCOSE-CAPILLARY: 206 mg/dL — AB (ref 70–99)
Glucose-Capillary: 291 mg/dL — ABNORMAL HIGH (ref 70–99)
Glucose-Capillary: 190 mg/dL — ABNORMAL HIGH (ref 70–99)
Glucose-Capillary: 210 mg/dL — ABNORMAL HIGH (ref 70–99)

## 2014-02-12 LAB — PROTIME-INR
INR: 1.27 (ref 0.00–1.49)
Prothrombin Time: 15.6 seconds — ABNORMAL HIGH (ref 11.6–15.2)

## 2014-02-12 MED ORDER — CLONIDINE HCL 0.1 MG/24HR TD PTWK
0.1000 mg | MEDICATED_PATCH | TRANSDERMAL | Status: DC
  Administered 2014-02-14: 0.1 mg via TRANSDERMAL
  Filled 2014-02-12: qty 1

## 2014-02-12 MED ORDER — WARFARIN SODIUM 4 MG PO TABS
4.0000 mg | ORAL_TABLET | Freq: Once | ORAL | Status: AC
  Administered 2014-02-12: 4 mg via ORAL
  Filled 2014-02-12: qty 1

## 2014-02-12 NOTE — Plan of Care (Signed)
Problem: RH PAIN MANAGEMENT Goal: RH STG PAIN MANAGED AT OR BELOW PT'S PAIN GOAL <4 on a 0-10 scale  Outcome: Not Progressing Pt. Continues to having chronic ongoing back pain.

## 2014-02-12 NOTE — Progress Notes (Signed)
Physical Therapy Session Note  Patient Details  Name: Lindsay Sheppard MRN: [id number] Date of Birth: 10-Oct-1920  Today's Date: 02/12/2014 Time: 1255-1340 Time Calculation (min): 45 min    Skilled Therapeutic Interventions/Progress Updates:  Session focused on gait training with FWW in the hall and in the room as well as maneuvering in and out of the bathroom, patient needs contact guard assist and cues to reduce pressure on walker through hands.  Training in sit to supine and back with min A. Step ups on 4 inch step 2 x 10, stepping on and off the step x5. Patient needs increased rest breaks due to pain and fatigue. Patient left in the rom with all needs within reach.  Therapy Documentation Precautions:  Precautions Precautions: Fall Precaution Comments: Notify physician for pulse less than 55 or greater than 120, respiratory rate less than 12 or greater than 25, temperature greater than 100.5 F, urinary output less than 30 mL/kg/hr for four hours, systolic BP less than 90 or greater than 786, diastolic BP less than 60 or greater than 100.; monitor orthostatic vitals; peripheral neuropathy, HOH, deaf in L ear Restrictions Weight Bearing Restrictions: No Vital Signs: Therapy Vitals Pulse Rate: 94 Resp: 18 BP: 134/63 mmHg Patient Position (if appropriate): Sitting Pain: Pain Assessment Pain Assessment: 0-10 Pain Score: 6  Pain Type: Acute pain Pain Location: Back Pain Orientation: Left;Lower Pain Descriptors / Indicators: Aching Pain Intervention(s): Rest;RN made aware;Distraction Locomotion : Ambulation Ambulation/Gait Assistance: 4: Min assist   See FIM for current functional status  Therapy/Group: Individual Therapy  Guadlupe Spanish 02/12/2014, 1:45 PM

## 2014-02-12 NOTE — Progress Notes (Signed)
Physical Therapy Session Note  Patient Details  Name: Lindsay Sheppard MRN: [id number] Date of Birth: [birth year]  Today's Date: 02/12/2014 Time: 1035-1130 Time Calculation (min): 55 min  Short Term Goals: Week 1:  PT Short Term Goal 1 (Week 1): Pt will perform bed mobility on flat bed with log roll with mod A and mod verbal cues PT Short Term Goal 2 (Week 1): Pt will perform bed <> w/c transfers stand pivot with UE support and mod A consistently PT Short Term Goal 3 (Week 1): Pt will peform gait x 75' with RW and mod A PT Short Term Goal 4 (Week 1): Pt will negotiate 2-3 stairs with 2 rails and mod A  PT Short Term Goal 5 (Week 1): Pt will tolerate dynamic standing balance activities x 3 minutes with pain <5/10  SkilledTherapeutic Interventions/Progress Updates:  1:1. Pt received sitting in w/c, ready for therapy. Focus this session on functional transfers, ambulation and standing balance. Pt practiced t/f R sidelying<>sit x2 in standard bed using log roll technique w/ min A, 2x w/ use of RW for L UE support and to decrease trunk rotation. Pt performed multiple furniture transfers in therapy apartment from various heights and arm rest availability w/ min guard-min A, cues for hand placement. Pt amb 15'x2 and 25'x2 in therapy apartment w/ RW as well as amb therapy apartment>main gym w/ RW and min A overall. Pt reports needed seated rest breaks more due to back pain than fatigue. Pt played two rounds of horseshoes w/ min guard A to target balance w/ L UE support on RW, reaching +/- midline w/in BOS w/ R UE. No LOB or posterior lean noted. Pt req min A for toileting at end of session. Pt left sitting on commode w/ RN present.   Therapy Documentation Precautions:  Precautions Precautions: Fall Precaution Comments: Notify physician for pulse less than 55 or greater than 120, respiratory rate less than 12 or greater than 25, temperature greater than 100.5 F, urinary output less than 30 mL/kg/hr for  four hours, systolic BP less than 90 or greater than 053, diastolic BP less than 60 or greater than 100.; monitor orthostatic vitals; peripheral neuropathy, HOH, deaf in L ear Restrictions Weight Bearing Restrictions: No Pain: Pain Assessment Pain Assessment: 0-10 Pain Score: 6  Faces Pain Scale: Hurts a little bit Pain Type: Acute pain Pain Location: Back Pain Orientation: Left;Lower Pain Descriptors / Indicators: Aching Pain Intervention(s): Rest;RN made aware;Distraction  See FIM for current functional status  Therapy/Group: Individual Therapy  Gilmore Laroche 02/12/2014, 11:46 AM

## 2014-02-12 NOTE — Progress Notes (Signed)
Subjective/Complaints: 80 y.o. right-handed female with history of HTN, DM type 2, postural hypotension, CAF--chronic coumadin, chronic back pain who fell while getting of the commode on 02/07/14 (question due to slipping or syncopal episode) and had onset of severe back pain. She was admitted for work up and Cranial CT scan negative for acute changes. Cardiac enzymes negative and 2D echocardiogram with ejection fraction of 55% and normal systolic function. CT of spine with cervical spondylolisthesis C6 on C7, multilevel compression fractures T3, T7, T11, T12, L2, severe DDD L4/L5 and old sacral fracture with new sclerotic lesion left sacral ala  Low back pain during therapy, no other c/os Review of Systems - Negative except low back pain and leg shakiness during therapy Objective: Vital Signs: Blood pressure 182/98, pulse 81, temperature 97.8 F (36.6 C), temperature source Oral, resp. rate 18, height $RemoveBe'5\' 6"'[id number]$  (1.676 m), weight 56 kg (123 lb 7.3 oz), SpO2 98.00%. No results found. Results for orders placed during the hospital encounter of 02/09/14 (from the past 72 hour(s))  GLUCOSE, CAPILLARY     Status: Abnormal   Collection Time    02/09/14  5:10 PM      Result Value Ref Range   Glucose-Capillary 299 (*) 70 - 99 mg/dL   Comment 1 Notify RN    GLUCOSE, CAPILLARY     Status: Abnormal   Collection Time    02/09/14  8:52 PM      Result Value Ref Range   Glucose-Capillary 128 (*) 70 - 99 mg/dL  CBC WITH DIFFERENTIAL     Status: Abnormal   Collection Time    02/10/14  7:05 AM      Result Value Ref Range   WBC 5.1  4.0 - 10.5 K/uL   RBC 4.02  3.87 - 5.11 MIL/uL   Hemoglobin 11.6 (*) 12.0 - 15.0 g/dL   HCT 36.2  36.0 - 46.0 %   MCV 90.0  78.0 - 100.0 fL   MCH 28.9  26.0 - 34.0 pg   MCHC 32.0  30.0 - 36.0 g/dL   RDW 16.1 (*) 11.5 - 15.5 %   Platelets 123 (*) 150 - 400 K/uL   Neutrophils Relative % 73  43 - 77 %   Neutro Abs 3.7  1.7 - 7.7 K/uL   Lymphocytes Relative 15  12 - 46 %    Lymphs Abs 0.8  0.7 - 4.0 K/uL   Monocytes Relative 9  3 - 12 %   Monocytes Absolute 0.5  0.1 - 1.0 K/uL   Eosinophils Relative 3  0 - 5 %   Eosinophils Absolute 0.1  0.0 - 0.7 K/uL   Basophils Relative 0  0 - 1 %   Basophils Absolute 0.0  0.0 - 0.1 K/uL  COMPREHENSIVE METABOLIC PANEL     Status: Abnormal   Collection Time    02/10/14  7:05 AM      Result Value Ref Range   Sodium 140  137 - 147 mEq/L   Potassium 4.8  3.7 - 5.3 mEq/L   Chloride 104  96 - 112 mEq/L   CO2 23  19 - 32 mEq/L   Glucose, Bld 197 (*) 70 - 99 mg/dL   BUN 42 (*) 6 - 23 mg/dL   Creatinine, Ser 1.54 (*) 0.50 - 1.10 mg/dL   Calcium 9.5  8.4 - 10.5 mg/dL   Total Protein 5.7 (*) 6.0 - 8.3 g/dL   Albumin 3.2 (*) 3.5 - 5.2 g/dL   AST 13  0 - 37 U/L   ALT 10  0 - 35 U/L   Alkaline Phosphatase 55  39 - 117 U/L   Total Bilirubin 0.3  0.3 - 1.2 mg/dL   GFR calc non Af Amer 29 (*) >90 mL/min   GFR calc Af Amer 33 (*) >90 mL/min   Comment: (NOTE)     The eGFR has been calculated using the CKD EPI equation.     This calculation has not been validated in all clinical situations.     eGFR's persistently <90 mL/min signify possible Chronic Kidney     Disease.  PROTIME-INR     Status: Abnormal   Collection Time    02/10/14  7:05 AM      Result Value Ref Range   Prothrombin Time 18.3 (*) 11.6 - 15.2 seconds   INR 1.57 (*) 0.00 - 1.49  GLUCOSE, CAPILLARY     Status: Abnormal   Collection Time    02/10/14  7:22 AM      Result Value Ref Range   Glucose-Capillary 178 (*) 70 - 99 mg/dL   Comment 1 Notify RN    GLUCOSE, CAPILLARY     Status: Abnormal   Collection Time    02/10/14 11:32 AM      Result Value Ref Range   Glucose-Capillary 222 (*) 70 - 99 mg/dL   Comment 1 Notify RN    GLUCOSE, CAPILLARY     Status: Abnormal   Collection Time    02/10/14  6:11 PM      Result Value Ref Range   Glucose-Capillary 287 (*) 70 - 99 mg/dL   Comment 1 Notify RN    GLUCOSE, CAPILLARY     Status: Abnormal   Collection Time     02/10/14  9:56 PM      Result Value Ref Range   Glucose-Capillary 158 (*) 70 - 99 mg/dL  PROTIME-INR     Status: None   Collection Time    02/11/14  3:02 AM      Result Value Ref Range   Prothrombin Time 15.2  11.6 - 15.2 seconds   INR 1.23  0.00 - 1.49  GLUCOSE, CAPILLARY     Status: Abnormal   Collection Time    02/11/14  7:43 AM      Result Value Ref Range   Glucose-Capillary 173 (*) 70 - 99 mg/dL   Comment 1 Notify RN    GLUCOSE, CAPILLARY     Status: Abnormal   Collection Time    02/11/14 11:48 AM      Result Value Ref Range   Glucose-Capillary 278 (*) 70 - 99 mg/dL   Comment 1 Notify RN    GLUCOSE, CAPILLARY     Status: Abnormal   Collection Time    02/11/14  4:44 PM      Result Value Ref Range   Glucose-Capillary 224 (*) 70 - 99 mg/dL   Comment 1 Notify RN    GLUCOSE, CAPILLARY     Status: Abnormal   Collection Time    02/11/14  8:21 PM      Result Value Ref Range   Glucose-Capillary 207 (*) 70 - 99 mg/dL  PROTIME-INR     Status: Abnormal   Collection Time    02/12/14  5:56 AM      Result Value Ref Range   Prothrombin Time 15.6 (*) 11.6 - 15.2 seconds   INR 1.27  0.00 - 1.49  GLUCOSE, CAPILLARY     Status:  Abnormal   Collection Time    02/12/14  6:54 AM      Result Value Ref Range   Glucose-Capillary 206 (*) 70 - 99 mg/dL      Physical Exam  Constitutional: She is oriented to person, place, and time.  HENT:  Head: Normocephalic.  Eyes: EOM are normal.  Neck: Normal range of motion. Neck supple. No thyromegaly present.  Cardiovascular:  Cardiac rate controlled  Respiratory: Effort normal and breath sounds normal. No respiratory distress.  GI: Soft. Bowel sounds are normal. She exhibits no distension.  Neurological: She is alert and oriented to person, place, and time.  Patient makes good eye contact with examiner. HOH. Reasonable insight and awareness. Moves all 4's but proximal LE's limited by pain. UE's 5/5. HF 2+, KE 3+, ADF/APF 4/5. Stocking  glove sensory loss to above the calves.  Skin: Skin is warm and dry.  Psychiatric: She has a normal mood and affect. Her behavior is normal. Thought content normal.    Assessment/Plan: 1. Functional deficits secondary to Fall resulting in lumbar and thoracic compression fractures which require 3+ hours per day of interdisciplinary therapy in a comprehensive inpatient rehab setting. Physiatrist is providing close team supervision and 24 hour management of active medical problems listed below. Physiatrist and rehab team continue to assess barriers to discharge/monitor patient progress toward functional and medical goals. FIM: FIM - Bathing Bathing Steps Patient Completed: Chest;Right Arm;Left Arm;Abdomen;Right upper leg;Left upper leg;Front perineal area Bathing: 3: Mod-Patient completes 5-7 77f 10 parts or 50-74%  FIM - Upper Body Dressing/Undressing Upper body dressing/undressing steps patient completed: Thread/unthread right sleeve of pullover shirt/dresss;Thread/unthread left sleeve of pullover shirt/dress;Put head through opening of pull over shirt/dress;Pull shirt over trunk Upper body dressing/undressing: 3: Mod-Patient completed 50-74% of tasks FIM - Lower Body Dressing/Undressing Lower body dressing/undressing steps patient completed: Thread/unthread right underwear leg;Thread/unthread left underwear leg;Thread/unthread right pants leg;Thread/unthread left pants leg Lower body dressing/undressing: 2: Max-Patient completed 25-49% of tasks  FIM - Toileting Toileting steps completed by patient: Adjust clothing prior to toileting;Performs perineal hygiene;Adjust clothing after toileting Toileting: 3: Mod-Patient completed 2 of 3 steps  FIM - Radio producer Devices: Bedside commode;Grab bars Toilet Transfers: 2-To toilet/BSC: Max A (lift and lower assist);2-From toilet/BSC: Max A (lift and lower assist)  FIM - Control and instrumentation engineer  Devices: Walker;Arm rests Bed/Chair Transfer: 3: Chair or W/C > Bed: Mod A (lift or lower assist);3: Bed > Chair or W/C: Mod A (lift or lower assist)  FIM - Locomotion: Wheelchair Distance: 150 Locomotion: Wheelchair: 1: Total Assistance/staff pushes wheelchair (Pt<25%) FIM - Locomotion: Ambulation Locomotion: Ambulation Assistive Devices: Administrator Ambulation/Gait Assistance: 4: Min assist Locomotion: Ambulation: 2: Travels 50 - 149 ft with minimal assistance (Pt.>75%)  Comprehension Comprehension Mode: Auditory Comprehension: 5-Follows basic conversation/direction: With extra time/assistive device  Expression Expression Mode: Verbal Expression: 5-Expresses basic needs/ideas: With extra time/assistive device  Social Interaction Social Interaction: 4-Interacts appropriately 75 - 89% of the time - Needs redirection for appropriate language or to initiate interaction.  Problem Solving Problem Solving: 4-Solves basic 75 - 89% of the time/requires cueing 10 - 24% of the time  Memory Memory: 4-Recognizes or recalls 75 - 89% of the time/requires cueing 10 - 24% of the time   Medical Problem List and Plan:  1. Functional deficits secondary to lumbar compression and sacral fxs (acute on chronic) exacerbated by recent fall  2. DVT Prophylaxis/Anticoagulation: Pharmaceutical: Coumadin  3. Pain Management: Will continue tylenol 650  mg tid. Ice and/or heat prn for comfort. Oxycodone prn for severe pain.  4. Mood: LCSW to follow for evaluation and support.  5. Neuropsych: This patient is capable of making decisions on her own behalf.  6. DM type 2: Will monitor with ac/hs checks. Continue Lantus 10 units daiy with SSI for elevated BS.  7. HTN: Monitor every 8 hours. Start catapres TTS 1 patch may need to titrateand labetalol bid.  8. Chronic constipation: Miralax bid as well as senna daily to avoid recurrent issues with constipation and rectal pain. Limit narcotics.  9. CAF: Monitor  HR every 8 hours. On coumadin with INR closely monitored by PharmD  10. Iron deficiency anemia: Continue iron supplement bid.  11. Chronic diastolic dysfunction: check daily weights. Low salt diet. Monitor for signs of overload. Continue to hold lasix for now.  12. CKD: baseline Cr-1.7. BUN with mild elevation. Encourage po fluids..  13. H/o orthostatic hypotension/syncope: Question vasovagal reaction--check orthostatic BP/pulse and not to be left alone while in BR.  14.  Soft diet but no history of dysphagia, adjust restrictions LOS (Days) 3 A FACE TO FACE EVALUATION WAS PERFORMED  Lindsay Sheppard E 02/12/2014, 8:42 AM

## 2014-02-12 NOTE — Progress Notes (Signed)
ANTICOAGULATION CONSULT NOTE - Follow Up Consult  Pharmacy Consult for coumadin/lovenox Indication: atrial fibrillation  Allergies  Allergen Reactions  . Amiodarone     REACTION: f? fluid retention  . Deltasone [Prednisone]   . Diazepam     REACTION: agitation  . Diltiazem Hcl   . Levofloxacin     REACTION: nausea  . Pregabalin     REACTION: hallucinations  . Spironolactone     REACTION: elevated K at lowest dose  . Tequin [Gatifloxacin]     Patient Measurements: Height: 5\' 6"  (167.6 cm) Weight: 123 lb 7.3 oz (56 kg) IBW/kg (Calculated) : 59.3   Vital Signs: Temp: 97.8 F (36.6 C) (06/14 0701) Temp src: Oral (06/14 0701) BP: 182/98 mmHg (06/14 0701) Pulse Rate: 81 (06/14 0701)  Labs:  Recent Labs  02/10/14 0705 02/11/14 0302 02/12/14 0556  HGB 11.6*  --   --   HCT 36.2  --   --   PLT 123*  --   --   LABPROT 18.3* 15.2 15.6*  INR 1.57* 1.23 1.27  CREATININE 1.54*  --   --     Estimated Creatinine Clearance: 21.5 ml/min (by C-G formula based on Cr of 1.54).  Assessment: Anticoagulation: Pt on coumadin PTA for Afib. INR = 1.27 today after Vitamin K 2.5 mg PO x given due to supratherapeutic INR   INR was THERAPEUTIC on hospitial admit date 6/9.  Patient was also taking suppression therapy with bactrim PTA for UTI prophylaxis (h/o recurrent UTI) and bactrim  continued on admission.  Her daughter said her mom has been on bactrim for a couple of years. Her baseline INR is 2.67 on home dose of 2.5mg  daily. On 02/09/14 the pharmacist discussed with Dr. Jenny Reichmann the potential of significant drug -drug interaction with bactrim and coumadin (increase coumadin effect).  Dr. Jenny Reichmann discontinued the bactrim on 02/09/14.  INR is now SUBtherapeutic after Vitamin K. Coumadin per pharmacy consult continued for h/o Afib. Also on lovenox while INR subtherpaeutic   Patient was [facility] to [facility] on 02/07/14 s/p fall with therapeutic INR.  CT head 02/07/14 No acute intracranial abnormality, no  hemorrhage.  Iron deficiency anemia: Continues on  iron supplement bid.  No bleeding noted.    Goal of Therapy:  INR 2-3    Plan:  Coumadin 4 mg today  (now off bactrim and post Vitamin K) INR qAM.  Continue lovenox   Excell Seltzer Clinical Pharmacist Pager: [phone] 02/12/2014,9:23 AM

## 2014-02-12 NOTE — Progress Notes (Signed)
Occupational Therapy Session Note  Patient Details  Name: Lindsay Sheppard MRN: [id number] Date of Birth: [birth year]  Today's Date: 02/12/2014 Time:  - 1430-1500  (30 min)    Short Term Goals: Week 1:  OT Short Term Goal 1 (Week 1): Pt will be supervision grooming sitting at sink OT Short Term Goal 1 - Progress (Week 1): Progressing toward goal OT Short Term Goal 2 (Week 1): Pt will be Min A SPT to 3:1 using grab bars OT Short Term Goal 2 - Progress (Week 1): Progressing toward goal OT Short Term Goal 3 (Week 1): Pt will be Mod I sponge bathe sitting at sink w/ set up sit to stand (a/e PRN) OT Short Term Goal 3 - Progress (Week 1): Progressing toward goal OT Short Term Goal 4 (Week 1): Pt will be Mod A SPT shower transfer using grab bars & shower seat with back OT Short Term Goal 4 - Progress (Week 1): Progressing toward goal OT Short Term Goal 5 (Week 1): Pt will be Mod A simple meal/snack prep sit to stand in ADL kitchen OT Short Term Goal 5 - Progress (Week 1): Progressing toward goal  Skilled Therapeutic Interventions/Progress Updates:    1:1. Pt received sitting in w/c, ready for therapy.  Pt. Complained that her back was really hurting even though she had been to therapy, it was not feeling any better.  Agreed to try heat pack.  Pt. Expressed need to go to bathroom.  Ambulated with RW to bathroom.  Pt. Sat on 3n1.  Daughter, Ivin Booty during session.  She reported pt's toilet had bigger seat and pt able to perform peri care.  Had pt stand with minimal assist for balance to clean self.  Pt. Ambulated to sink and washed hands.  Ambulated out to ADL apartment and back to room.  Applied moist heat to thoracic area.  Asked Daughter to remove after 15 minutes and check every few minutes to ensure pack was not too hot.  Daughter verbalized understanding.     Therapy Documentation Precautions:  Precautions Precautions: Fall Precaution Comments: Notify physician for pulse less than 55 or greater  than 120, respiratory rate less than 12 or greater than 25, temperature greater than 100.5 F, urinary output less than 30 mL/kg/hr for four hours, systolic BP less than 90 or greater than 350, diastolic BP less than 60 or greater than 100.; monitor orthostatic vitals; peripheral neuropathy, HOH, deaf in L ear Restrictions Weight Bearing Restrictions: No      Pain: Pain Assessment Pain Score: 6 Faces Pain Scale: Hurts a lot--back         See FIM for current functional status  Therapy/Group: Individual Therapy  Lisa Roca 02/12/2014, 6:15 PM

## 2014-02-12 NOTE — Progress Notes (Signed)
Occupational Therapy Session Note  Patient Details  Name: Lindsay Sheppard MRN: [id number] Date of Birth: 03-16-1921  Today's Date: 02/12/2014 Time: 0800-0900 Time Calculation (min): 60 min  Short Term Goals: Week 1:  OT Short Term Goal 1 (Week 1): Pt will be supervision grooming sitting at sink OT Short Term Goal 1 - Progress (Week 1): Progressing toward goal OT Short Term Goal 2 (Week 1): Pt will be Min A SPT to 3:1 using grab bars OT Short Term Goal 2 - Progress (Week 1): Progressing toward goal OT Short Term Goal 3 (Week 1): Pt will be Mod I sponge bathe sitting at sink w/ set up sit to stand (a/e PRN) OT Short Term Goal 3 - Progress (Week 1): Progressing toward goal OT Short Term Goal 4 (Week 1): Pt will be Mod A SPT shower transfer using grab bars & shower seat with back OT Short Term Goal 4 - Progress (Week 1): Progressing toward goal OT Short Term Goal 5 (Week 1): Pt will be Mod A simple meal/snack prep sit to stand in ADL kitchen OT Short Term Goal 5 - Progress (Week 1): Progressing toward goal  Skilled Therapeutic Interventions/Progress Updates:    1:1 self care retraining at shower level with focus on sit to stand, standing balance, functional ambulation with RW, transfer to toilet and shower stall, etc. Pt with difficulty with reaching bottom for hygiene with toileting and with washing in shower requiring A. Pt able to ambulation with RW around room with steady. Sit to stands in session varied from supervision to min A depending on the height of the surface (the higher the surface the less A required. Sat EOB to dress and able to dress right side with bring right LE up to her trunk to thread LB clothing however with increased pain pt not able to complete requiring A.   Therapy Documentation Precautions:  Precautions Precautions: Fall Precaution Comments: Notify physician for pulse less than 55 or greater than 120, respiratory rate less than 12 or greater than 25, temperature  greater than 100.5 F, urinary output less than 30 mL/kg/hr for four hours, systolic BP less than 90 or greater than 329, diastolic BP less than 60 or greater than 100.; monitor orthostatic vitals; peripheral neuropathy, HOH, deaf in L ear Restrictions Weight Bearing Restrictions: No General:   Vital Signs: Therapy Vitals Temp: 97.8 F (36.6 C) Temp src: Oral Pulse Rate: 81 Resp: 18 BP: 182/98 mmHg Patient Position (if appropriate): Lying Oxygen Therapy SpO2: 98 % O2 Device: None (Room air) Pain: Pain Assessment Pain Assessment: 0-10 Pain Score: 6  Pain Type: Chronic pain Pain Descriptors / Indicators: Aching Applied muscle rub cream to lower back  See FIM for current functional status  Therapy/Group: Individual Therapy  Willeen Cass [facility] 02/12/2014, 8:53 AM

## 2014-02-13 ENCOUNTER — Inpatient Hospital Stay (HOSPITAL_COMMUNITY): Payer: Medicare Other

## 2014-02-13 ENCOUNTER — Inpatient Hospital Stay (HOSPITAL_COMMUNITY): Payer: Medicare Other | Admitting: Occupational Therapy

## 2014-02-13 ENCOUNTER — Inpatient Hospital Stay (HOSPITAL_COMMUNITY): Payer: Medicare Other | Admitting: *Deleted

## 2014-02-13 ENCOUNTER — Encounter (HOSPITAL_COMMUNITY): Payer: Medicare Other | Admitting: Occupational Therapy

## 2014-02-13 DIAGNOSIS — T148XXA Other injury of unspecified body region, initial encounter: Secondary | ICD-10-CM

## 2014-02-13 DIAGNOSIS — W19XXXA Unspecified fall, initial encounter: Secondary | ICD-10-CM

## 2014-02-13 LAB — PROTIME-INR
INR: 1.43 (ref 0.00–1.49)
Prothrombin Time: 17.1 seconds — ABNORMAL HIGH (ref 11.6–15.2)

## 2014-02-13 LAB — GLUCOSE, CAPILLARY
GLUCOSE-CAPILLARY: 216 mg/dL — AB (ref 70–99)
Glucose-Capillary: 228 mg/dL — ABNORMAL HIGH (ref 70–99)
Glucose-Capillary: 246 mg/dL — ABNORMAL HIGH (ref 70–99)
Glucose-Capillary: 203 mg/dL — ABNORMAL HIGH (ref 70–99)

## 2014-02-13 MED ORDER — WARFARIN SODIUM 4 MG PO TABS
4.0000 mg | ORAL_TABLET | Freq: Once | ORAL | Status: AC
  Administered 2014-02-13: 4 mg via ORAL
  Filled 2014-02-13: qty 1

## 2014-02-13 MED ORDER — OXYCODONE HCL 5 MG PO TABS
5.0000 mg | ORAL_TABLET | Freq: Four times a day (QID) | ORAL | Status: DC | PRN
  Administered 2014-02-13 – 2014-02-16 (×3): 5 mg via ORAL
  Filled 2014-02-13 (×3): qty 1

## 2014-02-13 NOTE — Progress Notes (Signed)
Subjective/Complaints: 80 y.o. right-handed female with history of HTN, DM type 2, postural hypotension, CAF--chronic coumadin, chronic back pain who fell while getting of the commode on 02/07/14 (question due to slipping or syncopal episode) and had onset of severe back pain. She was admitted for work up and Cranial CT scan negative for acute changes. Cardiac enzymes negative and 2D echocardiogram with ejection fraction of 55% and normal systolic function. CT of spine with cervical spondylolisthesis C6 on C7, multilevel compression fractures T3, T7, T11, T12, L2, severe DDD L4/L5 and old sacral fracture with new sclerotic lesion left sacral ala  Low back pain still an issue. Unhappy that she's not getting biscuits with meals.  Review of Systems - otherwise negative Objective: Vital Signs: Blood pressure 176/84, pulse 94, temperature 98.2 F (36.8 C), temperature source Oral, resp. rate 18, height 5\' 6"  (1.676 m), weight 56.609 kg (124 lb 12.8 oz), SpO2 98.00%. No results found. Results for orders placed during the hospital encounter of 02/09/14 (from the past 72 hour(s))  GLUCOSE, CAPILLARY     Status: Abnormal   Collection Time    02/10/14 11:32 AM      Result Value Ref Range   Glucose-Capillary 222 (*) 70 - 99 mg/dL   Comment 1 Notify RN    GLUCOSE, CAPILLARY     Status: Abnormal   Collection Time    02/10/14  6:11 PM      Result Value Ref Range   Glucose-Capillary 287 (*) 70 - 99 mg/dL   Comment 1 Notify RN    GLUCOSE, CAPILLARY     Status: Abnormal   Collection Time    02/10/14  9:56 PM      Result Value Ref Range   Glucose-Capillary 158 (*) 70 - 99 mg/dL  PROTIME-INR     Status: None   Collection Time    02/11/14  3:02 AM      Result Value Ref Range   Prothrombin Time 15.2  11.6 - 15.2 seconds   INR 1.23  0.00 - 1.49  GLUCOSE, CAPILLARY     Status: Abnormal   Collection Time    02/11/14  7:43 AM      Result Value Ref Range   Glucose-Capillary 173 (*) 70 - 99 mg/dL    Comment 1 Notify RN    GLUCOSE, CAPILLARY     Status: Abnormal   Collection Time    02/11/14 11:48 AM      Result Value Ref Range   Glucose-Capillary 278 (*) 70 - 99 mg/dL   Comment 1 Notify RN    GLUCOSE, CAPILLARY     Status: Abnormal   Collection Time    02/11/14  4:44 PM      Result Value Ref Range   Glucose-Capillary 224 (*) 70 - 99 mg/dL   Comment 1 Notify RN    GLUCOSE, CAPILLARY     Status: Abnormal   Collection Time    02/11/14  8:21 PM      Result Value Ref Range   Glucose-Capillary 207 (*) 70 - 99 mg/dL  PROTIME-INR     Status: Abnormal   Collection Time    02/12/14  5:56 AM      Result Value Ref Range   Prothrombin Time 15.6 (*) 11.6 - 15.2 seconds   INR 1.27  0.00 - 1.49  GLUCOSE, CAPILLARY     Status: Abnormal   Collection Time    02/12/14  6:54 AM      Result Value Ref  Range   Glucose-Capillary 206 (*) 70 - 99 mg/dL  GLUCOSE, CAPILLARY     Status: Abnormal   Collection Time    02/12/14 11:46 AM      Result Value Ref Range   Glucose-Capillary 291 (*) 70 - 99 mg/dL   Comment 1 Notify RN    GLUCOSE, CAPILLARY     Status: Abnormal   Collection Time    02/12/14  4:30 PM      Result Value Ref Range   Glucose-Capillary 190 (*) 70 - 99 mg/dL  GLUCOSE, CAPILLARY     Status: Abnormal   Collection Time    02/12/14  9:54 PM      Result Value Ref Range   Glucose-Capillary 210 (*) 70 - 99 mg/dL  GLUCOSE, CAPILLARY     Status: Abnormal   Collection Time    02/13/14  7:15 AM      Result Value Ref Range   Glucose-Capillary 216 (*) 70 - 99 mg/dL   Comment 1 Notify RN    PROTIME-INR     Status: Abnormal   Collection Time    02/13/14  7:50 AM      Result Value Ref Range   Prothrombin Time 17.1 (*) 11.6 - 15.2 seconds   INR 1.43  0.00 - 1.49      Physical Exam  Constitutional: She is oriented to person, place, and time.  HENT:  Head: Normocephalic.  Eyes: EOM are normal.  Neck: Normal range of motion. Neck supple. No thyromegaly present.  Cardiovascular:   Cardiac rate controlled  Respiratory: Effort normal and breath sounds normal. No respiratory distress.  GI: Soft. Bowel sounds are normal. She exhibits no distension.  Neurological: She is alert and oriented to person, place, and time.   HOH. Reasonable insight and awareness. Moves all 4's but proximal LE's limited by pain. UE's 5/5. HF 2+, KE 3+, ADF/APF 4/5. Stocking glove sensory loss to above the calves.  Musc: low back tender to palpation and flexion Skin: Skin is warm and dry.  Psychiatric: She has a normal mood and affect. Her behavior is normal. Thought content normal.    Assessment/Plan: 1. Functional deficits secondary to Fall resulting in lumbar and thoracic compression fractures which require 3+ hours per day of interdisciplinary therapy in a comprehensive inpatient rehab setting. Physiatrist is providing close team supervision and 24 hour management of active medical problems listed below. Physiatrist and rehab team continue to assess barriers to discharge/monitor patient progress toward functional and medical goals. FIM: FIM - Bathing Bathing Steps Patient Completed: Chest;Right Arm;Left Arm;Abdomen;Right upper leg;Left upper leg;Front perineal area Bathing: 3: Mod-Patient completes 5-7 51f 10 parts or 50-74%  FIM - Upper Body Dressing/Undressing Upper body dressing/undressing steps patient completed: Thread/unthread right sleeve of pullover shirt/dresss;Thread/unthread left sleeve of pullover shirt/dress;Put head through opening of pull over shirt/dress;Pull shirt over trunk Upper body dressing/undressing: 5: Set-up assist to: Obtain clothing/put away FIM - Lower Body Dressing/Undressing Lower body dressing/undressing steps patient completed: Thread/unthread right underwear leg;Thread/unthread left underwear leg;Thread/unthread right pants leg;Thread/unthread left pants leg;Pull underwear up/down;Pull pants up/down;Don/Doff right sock;Don/Doff right shoe Lower body  dressing/undressing: 4: Min-Patient completed 75 plus % of tasks  FIM - Toileting Toileting steps completed by patient: Adjust clothing prior to toileting;Performs perineal hygiene Toileting: 4: Steadying assist  FIM - Air cabin crew Transfers Assistive Devices: Elevated toilet seat;Grab bars;Walker Toilet Transfers: 4-From toilet/BSC: Min A (steadying Pt. > 75%);4-To toilet/BSC: Min A (steadying Pt. > 75%)  FIM - Bed/Chair Transfer Bed/Chair  Transfer Assistive Devices: Copy: 4: Bed > Chair or W/C: Min A (steadying Pt. > 75%)  FIM - Locomotion: Wheelchair Distance: 150 Locomotion: Wheelchair: 0: Activity did not occur FIM - Locomotion: Ambulation Locomotion: Ambulation Assistive Devices: Administrator Ambulation/Gait Assistance: 4: Min guard Locomotion: Ambulation: 2: Travels 50 - 149 ft with minimal assistance (Pt.>75%)  Comprehension Comprehension Mode: Auditory Comprehension: 5-Follows basic conversation/direction: With extra time/assistive device  Expression Expression Mode: Verbal Expression: 5-Expresses basic needs/ideas: With extra time/assistive device  Social Interaction Social Interaction: 5-Interacts appropriately 90% of the time - Needs monitoring or encouragement for participation or interaction.  Problem Solving Problem Solving: 5-Solves basic 90% of the time/requires cueing < 10% of the time  Memory Memory: 4-Recognizes or recalls 75 - 89% of the time/requires cueing 10 - 24% of the time   Medical Problem List and Plan:  1. Functional deficits secondary to lumbar compression and sacral fxs (acute on chronic) exacerbated by recent fall  2. DVT Prophylaxis/Anticoagulation: Pharmaceutical: Coumadin  3. Pain Management: Will continue tylenol 650 mg tid. Ice and/or heat prn for comfort. Oxycodone prn for severe pain increased to 5mg   -add kpad   4. Mood: LCSW to follow for evaluation and support.  5. Neuropsych: This patient is  capable of making decisions on her own behalf.  6. DM type 2: poor control. Increase Lantus to 15 units daiy with SSI for elevated BS.  7. HTN: Monitor every 8 hours. Start catapres TTS 1 patch may need to titrateand labetalol bid.  8. Chronic constipation: Miralax bid as well as senna daily to avoid recurrent issues with constipation and rectal pain. Limit narcotics.  9. CAF: Monitor HR every 8 hours. On coumadin with INR closely monitored by PharmD  10. Iron deficiency anemia: Continue iron supplement bid.  11. Chronic diastolic dysfunction: check daily weights. Low salt diet. Monitor for signs of overload. Continue to hold lasix for now.  12. CKD: baseline Cr-1.7. BUN with mild elevation. Encourage po fluids..  13. H/o orthostatic hypotension/syncope:  No issues noted thus fars 14. FEN: changed to regular diet per pt request. (wants biscuits) LOS (Days) 4 A FACE TO FACE EVALUATION WAS PERFORMED  Shrika Milos T 02/13/2014, 8:41 AM

## 2014-02-13 NOTE — Progress Notes (Signed)
[facility] Individual Statement of Services  Patient Name:  Lindsay Sheppard  Date:  02/13/2014  Welcome to the [facility].  Our goal is to provide you with an individualized program based on your diagnosis and situation, designed to meet your specific needs.  With this comprehensive rehabilitation program, you will be expected to participate in at least 3 hours of rehabilitation therapies Monday-Friday, with modified therapy programming on the weekends.  Your rehabilitation program will include the following services:  Physical Therapy (PT), Occupational Therapy (OT), 24 hour per day rehabilitation nursing, Case Management (Social Worker), Rehabilitation Medicine, Nutrition Services and Pharmacy Services  Weekly team conferences will be held on Tuesdays to discuss your progress.  Your Social Worker will talk with you frequently to get your input and to update you on team discussions.  Team conferences with you and your family in attendance may also be held.  Expected length of stay:  10 to 12 days  Overall anticipated outcome:  Minimal Assistance  Depending on your progress and recovery, your program may change. Your Social Worker will coordinate services and will keep you informed of any changes. Your Social Worker's name and contact numbers are listed  below.  The following services may also be recommended but are not provided by the [facility] will be made to provide these services after discharge if needed.  Arrangements include referral to agencies that provide these services.  Your insurance has been verified to be:  Medicare and [employer] Your primary doctor is:  Dr. [name]  Pertinent information will be shared with your doctor and your insurance company.  Social Worker:  Alfonse Alpers, LCSW   [phone] or ([phone]  Information discussed with and copy given to patient by: Trey Sailors, 02/13/2014, 12:49 PM

## 2014-02-13 NOTE — Progress Notes (Signed)
Recreational Therapy Session Note  Patient Details  Name: Lindsay Sheppard MRN: [id number] Date of Birth: 16-Jan-1921 Today's Date: 02/13/2014  Met with pt and discussed TR services, no formal treatment plan implemented at this time.  Will continue to monitor through team for future participation.  [place] 02/13/2014, 3:23 PM

## 2014-02-13 NOTE — Progress Notes (Addendum)
ANTICOAGULATION CONSULT NOTE - Follow Up Consult  Pharmacy Consult for coumadin and lovenox Indication: atrial fibrillation  Allergies  Allergen Reactions  . Amiodarone     REACTION: f? fluid retention  . Deltasone [Prednisone]   . Diazepam     REACTION: agitation  . Diltiazem Hcl   . Levofloxacin     REACTION: nausea  . Pregabalin     REACTION: hallucinations  . Spironolactone     REACTION: elevated K at lowest dose  . Tequin [Gatifloxacin]     Patient Measurements: Height: 5\' 6"  (167.6 cm) Weight: 124 lb 12.8 oz (56.609 kg) IBW/kg (Calculated) : 59.3 Heparin Dosing Weight:   Vital Signs: Temp: 98.2 F (36.8 C) (06/15 0535) Temp src: Oral (06/15 0535) BP: 176/84 mmHg (06/15 0535) Pulse Rate: 94 (06/15 0535)  Labs:  Recent Labs  02/11/14 0302 02/12/14 0556 02/13/14 0750  LABPROT 15.2 15.6* 17.1*  INR 1.23 1.27 1.43    Estimated Creatinine Clearance: 21.7 ml/min (by C-G formula based on Cr of 1.54).   Medications:  Scheduled:  . acetaminophen  650 mg Oral TID  . [START ON 02/14/2014] cloNIDine  0.1 mg Transdermal Q7 days  . enoxaparin (LOVENOX) injection  50 mg Subcutaneous Q24H  . feeding supplement (GLUCERNA SHAKE)  237 mL Oral TID BM  . ferrous sulfate  325 mg Oral BID WC  . fluticasone  2 spray Each Nare Daily  . insulin aspart  0-9 Units Subcutaneous TID WC  . insulin glargine  10 Units Subcutaneous Daily  . labetalol  400 mg Oral BID  . loratadine  10 mg Oral Daily  . MUSCLE RUB  1 application Topical BID  . pantoprazole  40 mg Oral Daily  . polyethylene glycol  17 g Oral BID  . senna  2 tablet Oral Daily  . simvastatin  10 mg Oral q1800  . warfarin  4 mg Oral ONCE-1800  . Warfarin - Pharmacist Dosing Inpatient   Does not apply q1800   Infusions:    Assessment: 80 yo female with afib is currently on subtherapeutic coumadin.  INR is 1.43 from 1.27.  Patient is off bactrim now. She is also on lovenox treatment dose until INR >/= 2. Goal of  Therapy:  INR 2-3 Monitor platelets by anticoagulation protocol: Yes   Plan:  Coumadin 4 mg today (now off bactrim and post Vitamin K)  INR qAM.  Cont Lovenox 50mg  sq q24h (sl < 1mg /kg q24h) crcl 26ml/min    Melbert Botelho, Tsz-Yin 02/13/2014,8:28 AM

## 2014-02-13 NOTE — Progress Notes (Signed)
Social Work Assessment and Plan  Patient Details  Name: Lindsay Sheppard MRN: [id number] Date of Birth: 06-06-1921  Today's Date: 02/13/2014  Problem List:  Patient Active Problem List   Diagnosis Date Noted  . 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 Supratherapeutic INR 02/09/2014  . Recurrent UTI 02/09/2014  . Fractures 02/09/2014  . Chronic constipation 02/08/2014  . Hyperkalemia 02/07/2014  . Fall 02/07/2014  . Compression fracture 02/07/2014  . Syncope 02/07/2014  . Encounter for therapeutic drug monitoring 09/27/2013  . Foot pain 09/27/2013  . Rectal pain 09/27/2013  . Hearing loss, right 09/27/2013  . Left foot pain 05/12/2013  . Left shoulder pain 03/22/2013  . Abdominal  pain, other specified site 06/23/2012  . Fever 03/24/2012  . Lumbar back pain 03/10/2012  . Gait disorder 03/10/2012  . Fatigue 09/04/2011  . 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 Preventative health care 11/29/2010  . Pulmonary embolism 10/14/2010  . Other specified forms of hearing loss 05/30/2010  . DIASTOLIC HEART FAILURE, CHRONIC 12/14/2009  . POSTHERPETIC NEURALGIA 11/02/2009  . ALLERGIC RHINITIS 11/02/2009  . RENAL INSUFFICIENCY 07/02/2009  . POSTURAL HYPOTENSION 02/07/2008  . SYNCOPE 02/07/2008  . DEPRESSIVE DISORDER 01/25/2008  . CONSTIPATION 01/25/2008  . DIABETES MELLITUS, TYPE II 09/06/2007  . RENAL CYST, LEFT 09/06/2007  . B12 DEFICIENCY 05/23/2007  . HYPERLIPIDEMIA 05/23/2007  . Atrial fibrillation 05/23/2007  . OSTEOPOROSIS 05/23/2007  . SHINGLES, HX OF 05/23/2007  . ANEMIA-IRON DEFICIENCY 01/26/2007  . HYPERTENSION 01/26/2007  . GERD 01/26/2007   Past Medical History:  Past Medical History  Diagnosis Date  . POSTHERPETIC NEURALGIA 11/02/2009  . B12 DEFICIENCY 05/23/2007  . HYPERLIPIDEMIA 05/23/2007  . HYPERKALEMIA 05/30/2010  . ANEMIA-IRON DEFICIENCY 01/26/2007  . BLEPHARITIS, BILATERAL 02/23/2008  . Other specified forms of hearing loss 05/30/2010  . HYPERTENSION  01/26/2007  . Atrial fibrillation 05/23/2007  . DIASTOLIC HEART FAILURE, CHRONIC 12/14/2009  . POSTURAL HYPOTENSION 02/07/2008  . ALLERGIC RHINITIS 11/02/2009  . GERD 01/26/2007  . CONSTIPATION 01/25/2008  . SKIN LESION 05/30/2010  . BACK PAIN 08/02/2007  . OSTEOPOROSIS 05/23/2007  . SYNCOPE 02/07/2008  . FATIGUE 09/06/2007  . Dysuria 09/05/2008  . Abdominal pain, epigastric 09/06/2007  . EPIGASTRIC TENDERNESS 10/12/2007  . PULMONARY EMBOLISM, HX OF 05/23/2007  . SHINGLES, HX OF 05/23/2007  . 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 Left foot pain   . Diabetic neuropathy   . RENAL INSUFFICIENCY 07/02/2009  . RENAL CYST, LEFT 09/06/2007  . Kidney stones     "never had OR"  . Family history of anesthesia complication     "daughter has PONV; another daughter a little anesthesia lasts way too long"  . CHF (congestive heart failure) 2011  . Myocardial infarction 2011    "mild"  . DVT (deep venous thrombosis) ~ 1964    "think it was in the left"  . Aspiration pneumonia ~ 2006    "due to aspiration"  . DIABETES MELLITUS, TYPE II 09/06/2007  . Arthritis     "hands" (02/07/2014)  . DEPRESSIVE DISORDER 01/25/2008    "@ times; never treated for it"  . Melanoma of nose   . Compression fracture of lumbar vertebra     hx  . Compression fracture of thoracic vertebra 02/07/2014    "fell this am" (02/07/2014   Past Surgical History:  Past Surgical History  Procedure Laterality Date  . Appendectomy  1943  . Abdominal hysterectomy  1964  . Cardiac electrophysiology mapping and ablation  1999  . Cataract  extraction w/ intraocular lens  implant, bilateral  2003-2005  . Carpal tunnel release Right 2004  . Mohs surgery  2011    "removed off her nose"  . Cardiac catheterization  10/2009  . Esophageal dilation  X 2   Social History:  reports that she has never smoked. She has never used smokeless tobacco. She reports that she does not drink alcohol or use illicit drugs.  Family / Support Systems Marital Status:  Widow/Widower How Long?: 23 years Patient Roles: Parent;Other (Comment) (grandparent) Children: has 5 children Anticipated Caregiver: son and other daughters (5 children live on farm, but son lives with pt) Ability/Limitations of Caregiver: no limitations, son just recently retired from [employer] but does do farming work Careers adviser: 24/7 Family Dynamics: Close, supportive family  Social History Preferred language: English Religion: Christian Read: Yes Write: Yes Employment Status: Retired Public relations account executive Issues: None reported Guardian/Conservator: N/A   Abuse/Neglect Physical Abuse: Denies Verbal Abuse: Denies Sexual Abuse: Denies Exploitation of patient/patient's resources: Denies Self-Neglect: Denies  Emotional Status Pt's affect, behavior and adjustment status: Pt is very HOH and at times had trouble following conversation between [place] and her dtrs.  Otherwise, pt was pleasant and hopeful for rehabilitation.  Pt was on CIR about 4 years ago when she had a broken pelvis. Recent Psychosocial Issues: None reported Psychiatric History: Pt reports feeling lonely and low at times, but is feeling well emotionally. Substance Abuse History: None reported  Patient / Family Perceptions, Expectations & Goals Pt/Family understanding of illness & functional limitations: Pt and dtrs have an understanding of pt's condition and limitations. Premorbid pt/family roles/activities: Pt enjoys watching TV, doing basic household chores, and spending time outside and with family. Anticipated changes in roles/activities/participation: Pt wants to return to doing what she was doing before when she is safe to do so. Pt/family expectations/goals: Pt wants to get stronger and go home.    Community [employer] Agencies: None Premorbid Home Care/DME Agencies: Other (Comment) (had [facility] in the past, but nothing PTA) Transportation available at discharge:  family  Discharge Planning Living Arrangements: [place]: Children;Other relatives;Friends/neighbors;Church/faith community Type of Residence: Private residence Insurance Resources: Education officer, museum (specify) ([employer]) Museum/gallery curator Resources: Fish farm manager;Other (Comment) (other retirement funds) Financial Screen Referred: No Living Expenses: Own Money Management: Patient;Family Does the patient have any problems obtaining your medications?: No Home Management: Family can assist pt with this. Patient/Family Preliminary Plans: Pt will return to her home with support from her son and other children. Barriers to Discharge: Steps Social Work Anticipated Follow Up Needs: HH/OP Expected length of stay: 10 to 12 days  Clinical Impression CSW met with pt, pt's dtr's [name] and Ivin Booty, and pt's granddaughter to introduce self and role of CSW, as well as complete assessment.  Pt is familiar with CIR, as she was here about 4 years ago.  She feels her rehab is going well so far, but she is tired and worries if she can do all the therapy.  Pt has a large, supportive family and they are already working out for family members to be with pt 24/7 on a rotating basis.  Pt has a walker and shower chair at home, but may not have a back on her chair and will need that.  Pt may also need a bedside commode.  Pt would like to get a transport chair over a wheelchair, if this is recommended.  Pt lives on the first floor.  Pt had [facility] in the past.  No current questions/concerns/needs, however CSW will continue to follow and assist as needed.  Terecia Plaut, Silvestre Mesi 02/13/2014, 1:18 PM

## 2014-02-13 NOTE — Progress Notes (Signed)
Physical Therapy Session Note  Patient Details  Name: Lindsay Sheppard MRN: [id number] Date of Birth: 1921-05-14  Today's Date: 02/13/2014 Time: [phone] & [phone] Time Calculation (min): 43 min & 42 min  Short Term Goals: Week 1:  PT Short Term Goal 1 (Week 1): Pt will perform bed mobility on flat bed with log roll with mod A and mod verbal cues PT Short Term Goal 2 (Week 1): Pt will perform bed <> w/c transfers stand pivot with UE support and mod A consistently PT Short Term Goal 3 (Week 1): Pt will peform gait x 75' with RW and mod A PT Short Term Goal 4 (Week 1): Pt will negotiate 2-3 stairs with 2 rails and mod A  PT Short Term Goal 5 (Week 1): Pt will tolerate dynamic standing balance activities x 3 minutes with pain <5/10  Skilled Therapeutic Interventions/Progress Updates:  Pt. Family member(s) present for both AM & PM sessions with family education occurring each time. Daughters: Sharron & [name]; Sons:Wayne (lives with patient), Shanon Brow and Quita Skye  AM session (43 min): Daughter: Gatha Mayer present Therapeutic Activities: Pt. Ambulated > 80' x 2 with RW from room<>gym min guard assist for safety with verbal cues for direction and obstacle avoidance for increased community ambulation endurance. Transfers: sit<>stand x 10 with hand-hold assist from mat table for increased strength and forward posture reaching for core/trunk control; sit<>stand x 10 with RW from various height mat table with verbal cues for safety. Pt. Performed 2 static balance trials for 3 minute intervals to address standing endurance/tolerance for household tasks.  PM session(42 min): Daughter: [name] present RN provided medication prior to therapy. Therapeutic Activities: Pt. Ambulated > 80' x 2 with RW from room<>gym min guard assist for safety with verbal cues for direction and obstacle avoidance for increased community ambulation endurance. Pt. Performed multiple sit<>stand with recliner: arm rests, without  armrests,and with hand-hold assist for forward core/trunk control activities. Pt. Performed sit<>stand from chair with RW for stand<>car transfers x 2 min A for safety.Pt. Required minimal verbal cues for safety with multiple tactile cues for positioning.  Pt. Returned gym<>room min guard assist with verbal cues for direction/room location. Pt's extended family present in gym at tx end and present post-tx. Pt. In recliner with all needs within reach.  Pt. Reports unrated pain, but expressive about being tired for both sessions.   Therapy Documentation Precautions:  Precautions Precautions: Fall Precaution Comments: Notify physician for pulse less than 55 or greater than 120, respiratory rate less than 12 or greater than 25, temperature greater than 100.5 F, urinary output less than 30 mL/kg/hr for four hours, systolic BP less than 90 or greater than 081, diastolic BP less than 60 or greater than 100.; monitor orthostatic vitals; peripheral neuropathy, HOH, deaf in L ear Restrictions Weight Bearing Restrictions: No  Pain: Pain Assessment Unrated Acute pain Pain Location: Back Pain Orientation: Mid;Lower Pain Descriptors / Indicators: Aching Pain Frequency: Intermittent Pain Onset: Gradual Patients Stated Pain Goal: 0 Pain Intervention(s): Medication (See eMAR) Locomotion : Ambulation Ambulation/Gait Assistance: 4: Min guard   See FIM for current functional status  Therapy/Group: Individual Therapy  Juluis Mire 02/13/2014, 3:28 PM

## 2014-02-13 NOTE — Progress Notes (Signed)
Inpatient Diabetes Program Recommendations  AACE/ADA: New Consensus Statement on Inpatient Glycemic Control (2013)  Target Ranges:  Prepandial:   less than 140 mg/dL      Peak postprandial:   less than 180 mg/dL (1-2 hours)      Critically ill patients:  140 - 180 mg/dL  Results for AVAREE, GILBERTI (MRN [id number]) as of 02/13/2014 13:48  Ref. Range 02/12/2014 11:46 02/12/2014 16:30 02/12/2014 21:54 02/13/2014 07:15 02/13/2014 11:31  Glucose-Capillary Latest Range: 70-99 mg/dL 291 (H) 190 (H) 210 (H) 216 (H) 228 (H)   Inpatient Diabetes Program Recommendations Insulin - Basal: consider increasing Lantus to 12 units  Thank you  Raoul Pitch BSN, RN,CDE Inpatient Diabetes Coordinator [phone] (team pager)

## 2014-02-13 NOTE — Progress Notes (Signed)
Occupational Therapy Session Notes  Patient Details  Name: Lindsay Sheppard MRN: [id number] Date of Birth: [birth year]  Today's Date: 02/13/2014  Short Term Goals: Week 1:  OT Short Term Goal 1 (Week 1): Pt will be supervision grooming sitting at sink OT Short Term Goal 1 - Progress (Week 1): Progressing toward goal OT Short Term Goal 2 (Week 1): Pt will be Min A SPT to 3:1 using grab bars OT Short Term Goal 2 - Progress (Week 1): Progressing toward goal OT Short Term Goal 3 (Week 1): Pt will be Mod I sponge bathe sitting at sink w/ set up sit to stand (a/e PRN) OT Short Term Goal 3 - Progress (Week 1): Progressing toward goal OT Short Term Goal 4 (Week 1): Pt will be Mod A SPT shower transfer using grab bars & shower seat with back OT Short Term Goal 4 - Progress (Week 1): Progressing toward goal OT Short Term Goal 5 (Week 1): Pt will be Mod A simple meal/snack prep sit to stand in ADL kitchen OT Short Term Goal 5 - Progress (Week 1): Progressing toward goal  Skilled Therapeutic Interventions/Progress Updates:   Session #1 1035-1130 - 55 Minutes Individual Therapy Patient with 5/10 complaints of pain in back, RN aware Patient received seated in recliner with daughter present. Daughter laid out clothes for patient and left during therapy session. Patient stood using RW with moderate assistance and ambulated > w/c beside sink. UB/LB bathing & dressing completed at sink in sit<>stand position, patient refused shower this am. Once in w/c, patient able to stand with supervision>min assist. UB/LB bathing completed with supervision and UB/LB dressing with supervision; taking more than a reasonable amount of time and with multiple rest breaks. After ADL, patient ambulated into bathroom for toileting needs. At end of session, left patient seated in recliner with all needs within reach.   Session #2 2536-6440 - 59 Minutes Individual Therapy Patient with complaints of back pain, no rate given.  Patient stated she recently had something for pain. Patient received seated in recliner. Patient stood with min assist using RW and ambulated > ADL apartment. In ADL apartment patient worked on simulated walk-in shower transfers using shower seat. Therapist educated patient on stepping over threshold backwards and keeping RW outside of shower. Patient with poor carryover of education provided. Patient's family present during session, therefore therapist educated one of patient's daughters on shower stall transfers and [facility] transfers from bed. Therapist educated daughter on [facility] set up for a safe and effective transfer. Patient ambulated back to room and completed elevated toilet seat transfer with steady assist from therapist. Patient with one LOB episode and was corrected by therapist. At end of session, left patient seated in recliner with family present and all needs within reach.   Precautions:  Precautions Precautions: Fall Precaution Comments: Notify physician for pulse less than 55 or greater than 120, respiratory rate less than 12 or greater than 25, temperature greater than 100.5 F, urinary output less than 30 mL/kg/hr for four hours, systolic BP less than 90 or greater than 347, diastolic BP less than 60 or greater than 100.; monitor orthostatic vitals; peripheral neuropathy, HOH, deaf in L ear Restrictions Weight Bearing Restrictions: No  See FIM for current functional status  Divon Krabill 02/13/2014, 7:28 AM

## 2014-02-14 ENCOUNTER — Encounter (HOSPITAL_COMMUNITY): Payer: Medicare Other | Admitting: Occupational Therapy

## 2014-02-14 ENCOUNTER — Inpatient Hospital Stay (HOSPITAL_COMMUNITY): Payer: Medicare Other

## 2014-02-14 LAB — GLUCOSE, CAPILLARY
GLUCOSE-CAPILLARY: 254 mg/dL — AB (ref 70–99)
Glucose-Capillary: 160 mg/dL — ABNORMAL HIGH (ref 70–99)
Glucose-Capillary: 212 mg/dL — ABNORMAL HIGH (ref 70–99)
Glucose-Capillary: 238 mg/dL — ABNORMAL HIGH (ref 70–99)

## 2014-02-14 LAB — PROTIME-INR
INR: 1.72 — ABNORMAL HIGH (ref 0.00–1.49)
Prothrombin Time: 19.7 seconds — ABNORMAL HIGH (ref 11.6–15.2)

## 2014-02-14 LAB — CBC
HEMATOCRIT: 34.1 % — AB (ref 36.0–46.0)
Hemoglobin: 10.8 g/dL — ABNORMAL LOW (ref 12.0–15.0)
MCH: 28.8 pg (ref 26.0–34.0)
MCHC: 31.7 g/dL (ref 30.0–36.0)
MCV: 90.9 fL (ref 78.0–100.0)
Platelets: 164 10*3/uL (ref 150–400)
RBC: 3.75 MIL/uL — AB (ref 3.87–5.11)
RDW: 15.8 % — AB (ref 11.5–15.5)
WBC: 3.5 10*3/uL — AB (ref 4.0–10.5)

## 2014-02-14 MED ORDER — WARFARIN SODIUM 4 MG PO TABS
4.0000 mg | ORAL_TABLET | Freq: Once | ORAL | Status: AC
  Administered 2014-02-14: 4 mg via ORAL
  Filled 2014-02-14: qty 1

## 2014-02-14 NOTE — Progress Notes (Signed)
Occupational Therapy Session Note  Patient Details  Name: VIRGIA KELNER MRN: [id number] Date of Birth: 05-29-1921  Today's Date: 02/14/2014 Time: 1400-1430 Time Calculation (min): 30 min  Short Term Goals: Week 1:  OT Short Term Goal 1 (Week 1): Pt will be supervision grooming sitting at sink OT Short Term Goal 2 (Week 1): Pt will be Min A SPT to 3:1 using grab bars OT Short Term Goal 3 (Week 1): Pt will be Mod I sponge bathe sitting at sink w/ set up sit to stand (a/e PRN) OT Short Term Goal 4 (Week 1): Pt will be Mod A SPT shower transfer using grab bars & shower seat with back OT Short Term Goal 5 (Week 1): Pt will be Mod A simple meal/snack prep sit to stand in ADL kitchen  Skilled Therapeutic Interventions/Progress Updates:  Therapeutic activity with focus on sit<>stand, standing tolerance, dynamic standing, shower transfer, and pain management (low back).  Patient received seated in recliner and receptive for treatment although reporting mild fatigue from previous encounter (PT).   Patient completed sit<>stand, maintaining standing balance with min steadying assist at RW, donned gown and ambulated to shower with supervision.   Patient completed SPT to 3:1 in shower w/o assist and returned to recliner as daughter arrived.   Patient reported moderate back pain and accepted setup assist to apply K-pad.   OT educated daughter on use of K-pad, to remove after 30 min; RN notified.     Therapy Documentation Precautions:  Precautions Precautions: Fall Precaution Comments: Notify physician for pulse less than 55 or greater than 120, respiratory rate less than 12 or greater than 25, temperature greater than 100.5 F, urinary output less than 30 mL/kg/hr for four hours, systolic BP less than 90 or greater than 701, diastolic BP less than 60 or greater than 100.; monitor orthostatic vitals; peripheral neuropathy, HOH, deaf in L ear Restrictions Weight Bearing Restrictions: No General:   Vital  Signs: Therapy Vitals Temp: 98.5 F (36.9 C) Temp src: Oral Pulse Rate: 64 Resp: 18 BP: 160/83 mmHg Patient Position (if appropriate): Lying Oxygen Therapy SpO2: 98 % O2 Device: None (Room air) Pain: Pain Assessment Pain Assessment: No/denies pain Pain Score: 5  Pain Type: Acute pain Pain Location: Back Pain Descriptors / Indicators: Aching Pain Onset: On-going Patients Stated Pain Goal: 0 Pain Intervention(s): Heat applied;RN made aware ADL: ADL Eating: Set up Where Assessed-Eating: Wheelchair  See FIM for current functional status  Therapy/Group: Individual Therapy  [place] 02/14/2014, 3:21 PM

## 2014-02-14 NOTE — Progress Notes (Addendum)
Physical Therapy Session Note  Patient Details  Name: Lindsay Sheppard MRN: [id number] Date of Birth: September 04, 1920  Today's Date: 02/14/2014 Time: 4431-5400 & 1300-1403 Time Calculation (min): 66 min & 63 min  Short Term Goals: Week 1:  PT Short Term Goal 1 (Week 1): Pt will perform bed mobility on flat bed with log roll with mod A and mod verbal cues PT Short Term Goal 2 (Week 1): Pt will perform bed <> w/c transfers stand pivot with UE support and mod A consistently PT Short Term Goal 3 (Week 1): Pt will peform gait x 75' with RW and mod A PT Short Term Goal 4 (Week 1): Pt will negotiate 2-3 stairs with 2 rails and mod A  PT Short Term Goal 5 (Week 1): Pt will tolerate dynamic standing balance activities x 3 minutes with pain <5/10  Skilled Therapeutic Interventions/Progress Updates:   AM Session (66 min): Pt. Sitting in recliner, agreeable to therapy session. Daughter Gatha Mayer present. Gait: Pt. Ambulated 65' x 2 min guard assist for safety with cues for upright posture and step height. Pt. Presents with an outward turned L foot and has been a recent change according to family. Pt. States she feels discomfort if she tries to turn the foot forward during ambulation.  NM Re-ed: Pt. Performed Berg Balance Test in room with family member present. Pt. Participation was limited by back pain and scores reflect current fall risk. Patient demonstrates increased fall risk as noted by score of 12/56 on Berg Balance Scale.  (<36= high risk for falls, close to 100%; 37-45 significant >80%; 46-51 moderate >50%; 52-55 lower >25%)   PM Session (63 min): Pt. Sitting in recliner, agreeable to therapy session. Gait: Pt. Ambulated 300' x 2 min guard assist for safety with cues for upright posture and step height. Pt. Required several standing rest breaks during trials. Pt. Presents with an outward turned L foot and resists verbal cues for correcting due to stated discomfort. Pt. Min A for transfers for gait  trials. 3 steps x1 up/down with 2 rails min A for safety. Self-care: Pt. Sit<>Stand with RW<>toilet with hand rails min A for transfer and steady for pericare.  Therapy Documentation Precautions:  Precautions Precautions: Fall Precaution Comments: Notify physician for pulse less than 55 or greater than 120, respiratory rate less than 12 or greater than 25, temperature greater than 100.5 F, urinary output less than 30 mL/kg/hr for four hours, systolic BP less than 90 or greater than 867, diastolic BP less than 60 or greater than 100.; monitor orthostatic vitals; peripheral neuropathy, HOH, deaf in L ear Restrictions Weight Bearing Restrictions: No  Pain: Pain Assessment Pain Assessment: 0-10 Pain Score: 3  Faces Pain Scale: No hurt Pain Type: Acute pain Pain Location: Back Pain Descriptors / Indicators: Aching Pain Onset: Gradual Patients Stated Pain Goal: 3 Pain Intervention(s): Emotional support;Repositioned Locomotion : Ambulation Ambulation/Gait Assistance: 4: Min guard   Balance: Standardized Balance Assessment Standardized Balance Assessment: Berg Balance Test Berg Balance Test Sit to Stand: Able to stand using hands after several tries Standing Unsupported: Unable to stand 30 seconds unassisted Sitting with Back Unsupported but Feet Supported on Floor or Stool: Able to sit safely and securely 2 minutes Stand to Sit: Uses backs of legs against chair to control descent Transfers: Able to transfer with verbal cueing and /or supervision Standing Unsupported with Eyes Closed: Able to stand 3 seconds Standing Ubsupported with Feet Together: Needs help to attain position and unable to hold for 15  seconds From Standing, Reach Forward with Outstretched Arm: Loses balance while trying/requires external support From Standing Position, Pick up Object from Floor: Unable to try/needs assist to keep balance From Standing Position, Turn to Look Behind Over each Shoulder: Needs assist to  keep from losing balance and falling Turn 360 Degrees: Needs assistance while turning Standing Unsupported, Alternately Place Feet on Step/Stool: Needs assistance to keep from falling or unable to try Standing Unsupported, One Foot in Front: Loses balance while stepping or standing Standing on One Leg: Unable to try or needs assist to prevent fall Total Score: 12  See FIM for current functional status  Therapy/Group: Individual Therapy  Juluis Mire 02/14/2014, 12:31 PM

## 2014-02-14 NOTE — Progress Notes (Signed)
Subjective/Complaints: 80 y.o. right-handed female with history of HTN, DM type 2, postural hypotension, CAF--chronic coumadin, chronic back pain who fell while getting of the commode on 02/07/14 (question due to slipping or syncopal episode) and had onset of severe back pain. She was admitted for work up and Cranial CT scan negative for acute changes. Cardiac enzymes negative and 2D echocardiogram with ejection fraction of 55% and normal systolic function. CT of spine with cervical spondylolisthesis C6 on C7, multilevel compression fractures T3, T7, T11, T12, L2, severe DDD L4/L5 and old sacral fracture with new sclerotic lesion left sacral ala  Low back pain a little better. Happy to get regular food.  Review of Systems - otherwise negative Objective: Vital Signs: Blood pressure 176/82, pulse 85, temperature 97.9 F (36.6 C), temperature source Oral, resp. rate 18, height 5\' 6"  (1.676 m), weight 54.885 kg (121 lb), SpO2 99.00%. No results found. Results for orders placed during the hospital encounter of 02/09/14 (from the past 72 hour(s))  GLUCOSE, CAPILLARY     Status: Abnormal   Collection Time    02/11/14 11:48 AM      Result Value Ref Range   Glucose-Capillary 278 (*) 70 - 99 mg/dL   Comment 1 Notify RN    GLUCOSE, CAPILLARY     Status: Abnormal   Collection Time    02/11/14  4:44 PM      Result Value Ref Range   Glucose-Capillary 224 (*) 70 - 99 mg/dL   Comment 1 Notify RN    GLUCOSE, CAPILLARY     Status: Abnormal   Collection Time    02/11/14  8:21 PM      Result Value Ref Range   Glucose-Capillary 207 (*) 70 - 99 mg/dL  PROTIME-INR     Status: Abnormal   Collection Time    02/12/14  5:56 AM      Result Value Ref Range   Prothrombin Time 15.6 (*) 11.6 - 15.2 seconds   INR 1.27  0.00 - 1.49  GLUCOSE, CAPILLARY     Status: Abnormal   Collection Time    02/12/14  6:54 AM      Result Value Ref Range   Glucose-Capillary 206 (*) 70 - 99 mg/dL  GLUCOSE, CAPILLARY      Status: Abnormal   Collection Time    02/12/14 11:46 AM      Result Value Ref Range   Glucose-Capillary 291 (*) 70 - 99 mg/dL   Comment 1 Notify RN    GLUCOSE, CAPILLARY     Status: Abnormal   Collection Time    02/12/14  4:30 PM      Result Value Ref Range   Glucose-Capillary 190 (*) 70 - 99 mg/dL  GLUCOSE, CAPILLARY     Status: Abnormal   Collection Time    02/12/14  9:54 PM      Result Value Ref Range   Glucose-Capillary 210 (*) 70 - 99 mg/dL  GLUCOSE, CAPILLARY     Status: Abnormal   Collection Time    02/13/14  7:15 AM      Result Value Ref Range   Glucose-Capillary 216 (*) 70 - 99 mg/dL   Comment 1 Notify RN    PROTIME-INR     Status: Abnormal   Collection Time    02/13/14  7:50 AM      Result Value Ref Range   Prothrombin Time 17.1 (*) 11.6 - 15.2 seconds   INR 1.43  0.00 - 1.49  GLUCOSE, CAPILLARY  Status: Abnormal   Collection Time    02/13/14 11:31 AM      Result Value Ref Range   Glucose-Capillary 228 (*) 70 - 99 mg/dL   Comment 1 Notify RN    GLUCOSE, CAPILLARY     Status: Abnormal   Collection Time    02/13/14  4:22 PM      Result Value Ref Range   Glucose-Capillary 246 (*) 70 - 99 mg/dL  GLUCOSE, CAPILLARY     Status: Abnormal   Collection Time    02/13/14  9:42 PM      Result Value Ref Range   Glucose-Capillary 203 (*) 70 - 99 mg/dL  PROTIME-INR     Status: Abnormal   Collection Time    02/14/14  5:50 AM      Result Value Ref Range   Prothrombin Time 19.7 (*) 11.6 - 15.2 seconds   INR 1.72 (*) 0.00 - 1.49  CBC     Status: Abnormal   Collection Time    02/14/14  5:50 AM      Result Value Ref Range   WBC 3.5 (*) 4.0 - 10.5 K/uL   RBC 3.75 (*) 3.87 - 5.11 MIL/uL   Hemoglobin 10.8 (*) 12.0 - 15.0 g/dL   HCT 34.1 (*) 36.0 - 46.0 %   MCV 90.9  78.0 - 100.0 fL   MCH 28.8  26.0 - 34.0 pg   MCHC 31.7  30.0 - 36.0 g/dL   RDW 15.8 (*) 11.5 - 15.5 %   Platelets 164  150 - 400 K/uL  GLUCOSE, CAPILLARY     Status: Abnormal   Collection Time     02/14/14  7:23 AM      Result Value Ref Range   Glucose-Capillary 160 (*) 70 - 99 mg/dL   Comment 1 Notify RN        Physical Exam  Constitutional: She is oriented to person, place, and time.  HENT:  Head: Normocephalic.  Eyes: EOM are normal.  Neck: Normal range of motion. Neck supple. No thyromegaly present.  Cardiovascular:  Cardiac rate controlled  Respiratory: Effort normal and breath sounds normal. No respiratory distress.  GI: Soft. Bowel sounds are normal. She exhibits no distension.  Neurological: She is alert and oriented to person, place, and time.   HOH. Reasonable insight and awareness. Moves all 4's.  UE's 5/5. HF 2+, KE 3+, ADF/APF 4/5. Stocking glove sensory loss to above the calves.  Musc: low back tender to palpation and flexion Skin: Skin is warm and dry.  Psychiatric: She has a normal mood and affect. Her behavior is normal. Thought content normal.    Assessment/Plan: 1. Functional deficits secondary to Fall resulting in lumbar and thoracic compression fractures which require 3+ hours per day of interdisciplinary therapy in a comprehensive inpatient rehab setting. Physiatrist is providing close team supervision and 24 hour management of active medical problems listed below. Physiatrist and rehab team continue to assess barriers to discharge/monitor patient progress toward functional and medical goals. FIM: FIM - Bathing Bathing Steps Patient Completed: Chest;Right Arm;Left Arm;Abdomen;Right upper leg;Left upper leg;Front perineal area;Right lower leg (including foot);Left lower leg (including foot);Buttocks Bathing: 4: Steadying assist  FIM - Upper Body Dressing/Undressing Upper body dressing/undressing steps patient completed: Thread/unthread right sleeve of pullover shirt/dresss;Thread/unthread left sleeve of pullover shirt/dress;Put head through opening of pull over shirt/dress;Pull shirt over trunk Upper body dressing/undressing: 5: Set-up assist to: Obtain  clothing/put away FIM - Lower Body Dressing/Undressing Lower body dressing/undressing steps patient  completed: Thread/unthread right underwear leg;Thread/unthread left underwear leg;Thread/unthread right pants leg;Thread/unthread left pants leg;Pull underwear up/down;Pull pants up/down;Don/Doff right sock;Don/Doff right shoe;Don/Doff left shoe;Fasten/unfasten right shoe;Fasten/unfasten left shoe;Don/Doff left sock Lower body dressing/undressing: 4: Steadying Assist  FIM - Toileting Toileting steps completed by patient: Adjust clothing prior to toileting;Performs perineal hygiene;Adjust clothing after toileting Toileting Assistive Devices: Grab bar or rail for support Toileting: 4: Steadying assist  FIM - Radio producer Devices: Elevated toilet seat;Grab bars;Walker Toilet Transfers: 4-From toilet/BSC: Min A (steadying Pt. > 75%);4-To toilet/BSC: Min A (steadying Pt. > 75%)  FIM - Bed/Chair Transfer Bed/Chair Transfer Assistive Devices: Copy: 4: Bed > Chair or W/C: Min A (steadying Pt. > 75%);4: Chair or W/C > Bed: Min A (steadying Pt. > 75%)  FIM - Locomotion: Wheelchair Distance: 150 Locomotion: Wheelchair: 0: Activity did not occur FIM - Locomotion: Ambulation Locomotion: Ambulation Assistive Devices: Administrator Ambulation/Gait Assistance: 4: Min guard Locomotion: Ambulation: 2: Travels 50 - 149 ft with minimal assistance (Pt.>75%)  Comprehension Comprehension Mode: Auditory Comprehension: 6-Follows complex conversation/direction: With extra time/assistive device  Expression Expression Mode: Verbal Expression: 6-Expresses complex ideas: With extra time/assistive device  Social Interaction Social Interaction: 6-Interacts appropriately with others with medication or extra time (anti-anxiety, antidepressant).  Problem Solving Problem Solving: 6-Solves complex problems: With extra time  Memory Memory: 6-More than  reasonable amt of time   Medical Problem List and Plan:  1. Functional deficits secondary to lumbar compression and sacral fxs (acute on chronic) exacerbated by recent fall  2. DVT Prophylaxis/Anticoagulation: Pharmaceutical: Coumadin  3. Pain Management: Will continue tylenol 650 mg tid. Ice and/or heat prn for comfort. Oxycodone prn for severe pain increased to 5mg   -add kpad (apparently they are in short supply in hospital)  4. Mood: LCSW to follow for evaluation and support.  5. Neuropsych: This patient is capable of making decisions on her own behalf.  6. DM type 2: poor control. Increased Lantus to 15 units daiy with SSI for elevated BS--follow for today.  7. HTN: Monitor every 8 hours. Started catapres TTS 1 patch may need to titrateand labetalol bid.  8. Chronic constipation: Miralax bid as well as senna daily to avoid recurrent issues with constipation and rectal pain. Limit narcotics.  9. CAF: Monitor HR every 8 hours. On coumadin with INR closely monitored by PharmD  10. Iron deficiency anemia: Continue iron supplement bid.  11. Chronic diastolic dysfunction: check daily weights. Low salt diet. Monitor for signs of overload. Continue to hold lasix for now.  12. CKD: baseline Cr-1.7. BUN with mild elevation. Encourage po fluids..  13. H/o orthostatic hypotension/syncope:  No issues noted thus fars 14. FEN: changed to regular diet per pt request--no issue so far LOS (Days) 5 A FACE TO FACE EVALUATION WAS PERFORMED  SWARTZ,ZACHARY T 02/14/2014, 8:21 AM

## 2014-02-14 NOTE — Progress Notes (Signed)
Occupational Therapy Session Note  Patient Details  Name: Lindsay Sheppard MRN: [id number] Date of Birth: [birth year]  Today's Date: 02/14/2014 Time: 1015-1100 Time Calculation (min): 45 min  Short Term Goals: Week 1:  OT Short Term Goal 1 (Week 1): Pt will be supervision grooming sitting at sink OT Short Term Goal 1 - Progress (Week 1): Progressing toward goal OT Short Term Goal 2 (Week 1): Pt will be Min A SPT to 3:1 using grab bars OT Short Term Goal 2 - Progress (Week 1): Progressing toward goal OT Short Term Goal 3 (Week 1): Pt will be Mod I sponge bathe sitting at sink w/ set up sit to stand (a/e PRN) OT Short Term Goal 3 - Progress (Week 1): Progressing toward goal OT Short Term Goal 4 (Week 1): Pt will be Mod A SPT shower transfer using grab bars & shower seat with back OT Short Term Goal 4 - Progress (Week 1): Progressing toward goal OT Short Term Goal 5 (Week 1): Pt will be Mod A simple meal/snack prep sit to stand in ADL kitchen OT Short Term Goal 5 - Progress (Week 1): Progressing toward goal  Skilled Therapeutic Interventions/Progress Updates:  Patient received seated in recliner with RN, SW, and daughter present. Patient with request to have ADL prior to PT session; will work to accommodate this request. Patient's daughter left and patient stood using RW with supervision, ambulated into bathroom for toilet transfer & toileting needs. Patient refused shower this am stating, "I don't feel like it". Patient completed ADL retraining in sit<>stand position at sink. Also focusing on dynamic standing balance/tolerance/endurance and overall activity tolerance/endurance. At end of session, patient left seated in recliner with all needs within reach.   Precautions:  Precautions Precautions: Fall Precaution Comments: Notify physician for pulse less than 55 or greater than 120, respiratory rate less than 12 or greater than 25, temperature greater than 100.5 F, urinary output less than 30  mL/kg/hr for four hours, systolic BP less than 90 or greater than 409, diastolic BP less than 60 or greater than 100.; monitor orthostatic vitals; peripheral neuropathy, HOH, deaf in L ear Restrictions Weight Bearing Restrictions: No  See FIM for current functional status  Therapy/Group: Individual Therapy  CLAY,PATRICIA 02/14/2014, 11:48 AM

## 2014-02-14 NOTE — Progress Notes (Signed)
ANTICOAGULATION CONSULT NOTE - Follow Up Consult  Pharmacy Consult for coumadin Indication: atrial fibrillation  Allergies  Allergen Reactions  . Amiodarone     REACTION: f? fluid retention  . Deltasone [Prednisone]   . Diazepam     REACTION: agitation  . Diltiazem Hcl   . Levofloxacin     REACTION: nausea  . Pregabalin     REACTION: hallucinations  . Spironolactone     REACTION: elevated K at lowest dose  . Tequin [Gatifloxacin]     Patient Measurements: Height: 5\' 6"  (167.6 cm) Weight: 121 lb (54.885 kg) IBW/kg (Calculated) : 59.3 Heparin Dosing Weight:   Vital Signs: Temp: 97.9 F (36.6 C) (06/16 0631) Temp src: Oral (06/16 0631) BP: 176/82 mmHg (06/16 0655) Pulse Rate: 85 (06/16 0631)  Labs:  Recent Labs  02/12/14 0556 02/13/14 0750 02/14/14 0550  HGB  --   --  10.8*  HCT  --   --  34.1*  PLT  --   --  164  LABPROT 15.6* 17.1* 19.7*  INR 1.27 1.43 1.72*    Estimated Creatinine Clearance: 21 ml/min (by C-G formula based on Cr of 1.54).   Medications:  Scheduled:  . acetaminophen  650 mg Oral TID  . cloNIDine  0.1 mg Transdermal Q7 days  . enoxaparin (LOVENOX) injection  50 mg Subcutaneous Q24H  . feeding supplement (GLUCERNA SHAKE)  237 mL Oral TID BM  . ferrous sulfate  325 mg Oral BID WC  . fluticasone  2 spray Each Nare Daily  . insulin aspart  0-9 Units Subcutaneous TID WC  . insulin glargine  10 Units Subcutaneous Daily  . labetalol  400 mg Oral BID  . loratadine  10 mg Oral Daily  . MUSCLE RUB  1 application Topical BID  . pantoprazole  40 mg Oral Daily  . polyethylene glycol  17 g Oral BID  . senna  2 tablet Oral Daily  . simvastatin  10 mg Oral q1800  . Warfarin - Pharmacist Dosing Inpatient   Does not apply q1800   Infusions:    Assessment: 80 yo female with afib is currently on subtherapeutic coumadin.  Patient is also on treatment dose of lovenox.  INR is up to 1.72. CBC stable. Goal of Therapy:  Anti-Xa level 0.6-1 units/ml  4hrs after LMWH dose given; INR 2-3 Monitor platelets by anticoagulation protocol: Yes   Plan:  Coumadin 4 mg today (now off bactrim and post Vitamin K)  INR qAM.  Lovenox 50mg  sq q24h (sl < 1mg /kg q24h) crcl 56ml/min   So, Tsz-Yin 02/14/2014,8:02 AM

## 2014-02-15 ENCOUNTER — Encounter (HOSPITAL_COMMUNITY): Payer: Medicare Other | Admitting: Occupational Therapy

## 2014-02-15 ENCOUNTER — Ambulatory Visit (HOSPITAL_COMMUNITY): Payer: Medicare Other | Admitting: Physical Therapy

## 2014-02-15 ENCOUNTER — Inpatient Hospital Stay (HOSPITAL_COMMUNITY): Payer: Medicare Other | Admitting: Physical Therapy

## 2014-02-15 ENCOUNTER — Inpatient Hospital Stay (HOSPITAL_COMMUNITY): Payer: Medicare Other | Admitting: Occupational Therapy

## 2014-02-15 DIAGNOSIS — T148XXA Other injury of unspecified body region, initial encounter: Secondary | ICD-10-CM

## 2014-02-15 DIAGNOSIS — W19XXXA Unspecified fall, initial encounter: Secondary | ICD-10-CM

## 2014-02-15 LAB — PROTIME-INR
INR: 1.92 — ABNORMAL HIGH (ref 0.00–1.49)
PROTHROMBIN TIME: 21.4 s — AB (ref 11.6–15.2)

## 2014-02-15 LAB — GLUCOSE, CAPILLARY
GLUCOSE-CAPILLARY: 160 mg/dL — AB (ref 70–99)
GLUCOSE-CAPILLARY: 290 mg/dL — AB (ref 70–99)
Glucose-Capillary: 148 mg/dL — ABNORMAL HIGH (ref 70–99)
Glucose-Capillary: 225 mg/dL — ABNORMAL HIGH (ref 70–99)

## 2014-02-15 MED ORDER — WARFARIN SODIUM 4 MG PO TABS
4.0000 mg | ORAL_TABLET | Freq: Once | ORAL | Status: AC
  Administered 2014-02-15: 4 mg via ORAL
  Filled 2014-02-15: qty 1

## 2014-02-15 MED ORDER — OXYCODONE HCL 5 MG PO TABS: 5.0000 mg | ORAL_TABLET | Freq: Four times a day (QID) | ORAL | Status: DC | PRN

## 2014-02-15 MED ORDER — INSULIN GLARGINE 100 UNIT/ML ~~LOC~~ SOLN: 15.0000 [IU] | Freq: Every day | SUBCUTANEOUS | Status: DC

## 2014-02-15 MED ORDER — INSULIN GLARGINE 100 UNIT/ML ~~LOC~~ SOLN
15.0000 [IU] | Freq: Every day | SUBCUTANEOUS | Status: DC
  Administered 2014-02-16: 15 [IU] via SUBCUTANEOUS
  Filled 2014-02-15: qty 0.15

## 2014-02-15 MED ORDER — POLYETHYLENE GLYCOL 3350 17 G PO PACK: 17.0000 g | PACK | Freq: Two times a day (BID) | ORAL | Status: DC

## 2014-02-15 MED ORDER — FERROUS SULFATE 325 (65 FE) MG PO TABS: 325.0000 mg | ORAL_TABLET | Freq: Two times a day (BID) | ORAL | Status: DC

## 2014-02-15 MED ORDER — CLONIDINE HCL 0.1 MG/24HR TD PTWK: 0.1000 mg | MEDICATED_PATCH | TRANSDERMAL | Status: DC

## 2014-02-15 MED ORDER — INSULIN GLARGINE 100 UNIT/ML ~~LOC~~ SOLN
15.0000 [IU] | Freq: Every day | SUBCUTANEOUS | Status: DC
  Administered 2014-02-15: 15 [IU] via SUBCUTANEOUS
  Filled 2014-02-15: qty 0.15

## 2014-02-15 MED ORDER — INSULIN GLARGINE 100 UNIT/ML ~~LOC~~ SOLN: 20.0000 [IU] | Freq: Every day | SUBCUTANEOUS | Status: DC

## 2014-02-15 MED ORDER — WARFARIN SODIUM 2 MG PO TABS: ORAL_TABLET | ORAL | Status: DC

## 2014-02-15 NOTE — Progress Notes (Signed)
Physical Therapy Session Note  Patient Details  Name: Lindsay Sheppard MRN: [id number] Date of Birth: 12-07-1920  Today's Date: 02/15/2014 Time: 1415-1500 Time Calculation (min): 45 min  Short Term Goals: Week 1:  PT Short Term Goal 1 (Week 1): Pt will perform bed mobility on flat bed with log roll with mod A and mod verbal cues PT Short Term Goal 2 (Week 1): Pt will perform bed <> w/c transfers stand pivot with UE support and mod A consistently PT Short Term Goal 3 (Week 1): Pt will peform gait x 75' with RW and mod A PT Short Term Goal 4 (Week 1): Pt will negotiate 2-3 stairs with 2 rails and mod A  PT Short Term Goal 5 (Week 1): Pt will tolerate dynamic standing balance activities x 3 minutes with pain <5/10  Skilled Therapeutic Interventions/Progress Updates:    Pt participated in group session focused on increasing stability/independence with gait, stair negotiation, and dynamic standing balance. Gait training in controlled environment with rolling walker. Graded sit<>stand transfers x7 reps total from progressively lower mat table with rolling walker. Negotiated 15 stairs total for functional LE strengthening with bilat rails, forward-facing with step-to pattern. Performed dynamic standing balance activity (throwing/catching ball in standing) with concurrent cognitive task to increase pt safety with dual-tasking. Transitioned to balance activity in seated, per pt request, secondary to fatigue. Session ended in pt room, where pt was left seated on toilet with nurse tech present and all needs within reach.   Therapy Documentation Precautions:  Precautions Precautions: Fall Precaution Comments: Notify physician for pulse less than 55 or greater than 120, respiratory rate less than 12 or greater than 25, temperature greater than 100.5 F, urinary output less than 30 mL/kg/hr for four hours, systolic BP less than 90 or greater than 390, diastolic BP less than 60 or greater than 100.; monitor  orthostatic vitals; peripheral neuropathy, HOH, deaf in L ear Restrictions Weight Bearing Restrictions: No General: Amount of Missed PT Time (min): 15 Minutes Missed Time Reason: Other (comment) (Pt in bathroom for initial 15 minutes of session) Vital Signs: Therapy Vitals Temp: 97.5 F (36.4 C) Temp src: Oral Pulse Rate: 100 Resp: 18 BP: 136/82 mmHg Patient Position (if appropriate): Sitting Oxygen Therapy SpO2: 98 % O2 Device: None (Room air) Pain: No c/o pain during group PT session.  See FIM for current functional status  Therapy/Group: Group Therapy  Hobble, Malva Cogan 02/15/2014, 4:23 PM

## 2014-02-15 NOTE — Progress Notes (Signed)
ANTICOAGULATION CONSULT NOTE - Follow Up Consult  Pharmacy Consult for coumadin and lovenox Indication: atrial fibrillation  Allergies  Allergen Reactions  . Amiodarone     REACTION: f? fluid retention  . Deltasone [Prednisone]   . Diazepam     REACTION: agitation  . Diltiazem Hcl   . Levofloxacin     REACTION: nausea  . Pregabalin     REACTION: hallucinations  . Spironolactone     REACTION: elevated K at lowest dose  . Tequin [Gatifloxacin]     Patient Measurements: Height: 5\' 6"  (167.6 cm) Weight: 123 lb 9.6 oz (56.065 kg) IBW/kg (Calculated) : 59.3 Heparin Dosing Weight:   Vital Signs: Temp: 97.9 F (36.6 C) (06/17 0421) Temp src: Oral (06/17 0421) BP: 182/88 mmHg (06/17 0421) Pulse Rate: 93 (06/17 0421)  Labs:  Recent Labs  02/13/14 0750 02/14/14 0550 02/15/14 0400  HGB  --  10.8*  --   HCT  --  34.1*  --   PLT  --  164  --   LABPROT 17.1* 19.7* 21.4*  INR 1.43 1.72* 1.92*    Estimated Creatinine Clearance: 21.5 ml/min (by C-G formula based on Cr of 1.54).   Medications:  Scheduled:  . acetaminophen  650 mg Oral TID  . cloNIDine  0.1 mg Transdermal Q7 days  . enoxaparin (LOVENOX) injection  50 mg Subcutaneous Q24H  . feeding supplement (GLUCERNA SHAKE)  237 mL Oral TID BM  . ferrous sulfate  325 mg Oral BID WC  . fluticasone  2 spray Each Nare Daily  . insulin aspart  0-9 Units Subcutaneous TID WC  . insulin glargine  15 Units Subcutaneous Daily  . labetalol  400 mg Oral BID  . loratadine  10 mg Oral Daily  . MUSCLE RUB  1 application Topical BID  . pantoprazole  40 mg Oral Daily  . polyethylene glycol  17 g Oral BID  . senna  2 tablet Oral Daily  . simvastatin  10 mg Oral q1800  . Warfarin - Pharmacist Dosing Inpatient   Does not apply q1800   Infusions:    Assessment: 80 yo female with afib is currently on subtherapeutic coumadin bridging with treatment dose of lovenox.  INR up to 1.92 from 1.72.   Goal of Therapy:  Anti-Xa level 0.6-1  units/ml 4hrs after LMWH dose given; INR 2-3 Monitor platelets by anticoagulation protocol: Yes   Plan:  1) Coumadin 4 mg po x1 2) Cont lovenox 50mg  sq q24h 3) INR daily and CBC every 72 hours  So, Tsz-Yin 02/15/2014,8:04 AM

## 2014-02-15 NOTE — Progress Notes (Addendum)
Subjective/Complaints: 80 y.o. right-handed female with history of HTN, DM type 2, postural hypotension, CAF--chronic coumadin, chronic back pain who fell while getting of the commode on 02/07/14 (question due to slipping or syncopal episode) and had onset of severe back pain. She was admitted for work up and Cranial CT scan negative for acute changes. Cardiac enzymes negative and 2D echocardiogram with ejection fraction of 55% and normal systolic function. CT of spine with cervical spondylolisthesis C6 on C7, multilevel compression fractures T3, T7, T11, T12, L2, severe DDD L4/L5 and old sacral fracture with new sclerotic lesion left sacral ala  Still complains of low back pain. Slept fairly well. No other new complaints Review of Systems - otherwise negative Objective: Vital Signs: Blood pressure 182/88, pulse 93, temperature 97.9 F (36.6 C), temperature source Oral, resp. rate 18, height 5\' 6"  (1.676 m), weight 56.065 kg (123 lb 9.6 oz), SpO2 96.00%. No results found. Results for orders placed during the hospital encounter of 02/09/14 (from the past 72 hour(s))  GLUCOSE, CAPILLARY     Status: Abnormal   Collection Time    02/12/14 11:46 AM      Result Value Ref Range   Glucose-Capillary 291 (*) 70 - 99 mg/dL   Comment 1 Notify RN    GLUCOSE, CAPILLARY     Status: Abnormal   Collection Time    02/12/14  4:30 PM      Result Value Ref Range   Glucose-Capillary 190 (*) 70 - 99 mg/dL  GLUCOSE, CAPILLARY     Status: Abnormal   Collection Time    02/12/14  9:54 PM      Result Value Ref Range   Glucose-Capillary 210 (*) 70 - 99 mg/dL  GLUCOSE, CAPILLARY     Status: Abnormal   Collection Time    02/13/14  7:15 AM      Result Value Ref Range   Glucose-Capillary 216 (*) 70 - 99 mg/dL   Comment 1 Notify RN    PROTIME-INR     Status: Abnormal   Collection Time    02/13/14  7:50 AM      Result Value Ref Range   Prothrombin Time 17.1 (*) 11.6 - 15.2 seconds   INR 1.43  0.00 - 1.49   GLUCOSE, CAPILLARY     Status: Abnormal   Collection Time    02/13/14 11:31 AM      Result Value Ref Range   Glucose-Capillary 228 (*) 70 - 99 mg/dL   Comment 1 Notify RN    GLUCOSE, CAPILLARY     Status: Abnormal   Collection Time    02/13/14  4:22 PM      Result Value Ref Range   Glucose-Capillary 246 (*) 70 - 99 mg/dL  GLUCOSE, CAPILLARY     Status: Abnormal   Collection Time    02/13/14  9:42 PM      Result Value Ref Range   Glucose-Capillary 203 (*) 70 - 99 mg/dL  PROTIME-INR     Status: Abnormal   Collection Time    02/14/14  5:50 AM      Result Value Ref Range   Prothrombin Time 19.7 (*) 11.6 - 15.2 seconds   INR 1.72 (*) 0.00 - 1.49  CBC     Status: Abnormal   Collection Time    02/14/14  5:50 AM      Result Value Ref Range   WBC 3.5 (*) 4.0 - 10.5 K/uL   RBC 3.75 (*) 3.87 - 5.11 MIL/uL  Hemoglobin 10.8 (*) 12.0 - 15.0 g/dL   HCT 34.1 (*) 36.0 - 46.0 %   MCV 90.9  78.0 - 100.0 fL   MCH 28.8  26.0 - 34.0 pg   MCHC 31.7  30.0 - 36.0 g/dL   RDW 15.8 (*) 11.5 - 15.5 %   Platelets 164  150 - 400 K/uL  GLUCOSE, CAPILLARY     Status: Abnormal   Collection Time    02/14/14  7:23 AM      Result Value Ref Range   Glucose-Capillary 160 (*) 70 - 99 mg/dL   Comment 1 Notify RN    GLUCOSE, CAPILLARY     Status: Abnormal   Collection Time    02/14/14 11:06 AM      Result Value Ref Range   Glucose-Capillary 254 (*) 70 - 99 mg/dL   Comment 1 Notify RN    GLUCOSE, CAPILLARY     Status: Abnormal   Collection Time    02/14/14  4:27 PM      Result Value Ref Range   Glucose-Capillary 212 (*) 70 - 99 mg/dL  GLUCOSE, CAPILLARY     Status: Abnormal   Collection Time    02/14/14  9:16 PM      Result Value Ref Range   Glucose-Capillary 238 (*) 70 - 99 mg/dL  PROTIME-INR     Status: Abnormal   Collection Time    02/15/14  4:00 AM      Result Value Ref Range   Prothrombin Time 21.4 (*) 11.6 - 15.2 seconds   INR 1.92 (*) 0.00 - 1.49  GLUCOSE, CAPILLARY     Status: Abnormal    Collection Time    02/15/14  7:14 AM      Result Value Ref Range   Glucose-Capillary 160 (*) 70 - 99 mg/dL      Physical Exam  Constitutional: She is oriented to person, place, and time.  HENT:  Head: Normocephalic.  Eyes: EOM are normal.  Neck: Normal range of motion. Neck supple. No thyromegaly present.  Cardiovascular:  Cardiac rate controlled  Respiratory: Effort normal and breath sounds normal. No respiratory distress.  GI: Soft. Bowel sounds are normal. She exhibits no distension.  Neurological: She is alert and oriented to person, place, and time.   HOH. Reasonable insight and awareness. Moves all 4's.  UE's 5/5. HF 2+, KE 3+, ADF/APF 4/5. Stocking glove sensory loss to above the calves.  Musc: low back tender to palpation and flexion Skin: Skin is warm and dry.  Psychiatric: She has a normal mood and affect. Her behavior is normal. Thought content normal.    Assessment/Plan: 1. Functional deficits secondary to Fall resulting in lumbar and thoracic compression fractures which require 3+ hours per day of interdisciplinary therapy in a comprehensive inpatient rehab setting. Physiatrist is providing close team supervision and 24 hour management of active medical problems listed below. Physiatrist and rehab team continue to assess barriers to discharge/monitor patient progress toward functional and medical goals. FIM: FIM - Bathing Bathing Steps Patient Completed: Chest;Right Arm;Left Arm;Abdomen;Right upper leg;Left upper leg;Front perineal area;Right lower leg (including foot);Left lower leg (including foot);Buttocks Bathing: 5: Supervision: Safety issues/verbal cues  FIM - Upper Body Dressing/Undressing Upper body dressing/undressing steps patient completed: Thread/unthread right sleeve of pullover shirt/dresss;Thread/unthread left sleeve of pullover shirt/dress;Put head through opening of pull over shirt/dress;Pull shirt over trunk Upper body dressing/undressing: 5:  Set-up assist to: Obtain clothing/put away FIM - Lower Body Dressing/Undressing Lower body dressing/undressing steps patient completed:  Thread/unthread right underwear leg;Thread/unthread left underwear leg;Thread/unthread right pants leg;Thread/unthread left pants leg;Pull underwear up/down;Pull pants up/down;Don/Doff right sock;Don/Doff right shoe;Don/Doff left shoe;Fasten/unfasten right shoe;Fasten/unfasten left shoe;Don/Doff left sock Lower body dressing/undressing: 5: Supervision: Safety issues/verbal cues  FIM - Toileting Toileting steps completed by patient: Adjust clothing prior to toileting;Performs perineal hygiene;Adjust clothing after toileting Toileting Assistive Devices: Grab bar or rail for support Toileting: 5: Set-up assist to: Obtain supplies  FIM - Radio producer Devices: Elevated toilet seat;Grab bars;Walker Toilet Transfers: 5-To toilet/BSC: Supervision (verbal cues/safety issues);5-From toilet/BSC: Supervision (verbal cues/safety issues)  FIM - Control and instrumentation engineer Devices: Copy: 4: Bed > Chair or W/C: Min A (steadying Pt. > 75%);4: Chair or W/C > Bed: Min A (steadying Pt. > 75%)  FIM - Locomotion: Wheelchair Distance: 150 Locomotion: Wheelchair: 0: Activity did not occur FIM - Locomotion: Ambulation Locomotion: Ambulation Assistive Devices: Administrator Ambulation/Gait Assistance: 4: Min guard Locomotion: Ambulation: 5: Travels 150 ft or more with supervision/safety issues  Comprehension Comprehension Mode: Auditory Comprehension: 6-Follows complex conversation/direction: With extra time/assistive device  Expression Expression Mode: Verbal Expression: 6-Expresses complex ideas: With extra time/assistive device  Social Interaction Social Interaction: 6-Interacts appropriately with others with medication or extra time (anti-anxiety, antidepressant).  Problem Solving Problem  Solving: 6-Solves complex problems: With extra time  Memory Memory: 6-More than reasonable amt of time   Medical Problem List and Plan:  1. Functional deficits secondary to lumbar compression and sacral fxs (acute on chronic) exacerbated by recent fall  2. DVT Prophylaxis/Anticoagulation: Pharmaceutical: Coumadin  3. Pain Management: Will continue tylenol 650 mg tid. Ice and/or heat prn for comfort. Oxycodone prn for severe pain increased to 5mg   -added kpad (apparently they are in short supply in hospital)  4. Mood: LCSW to follow for evaluation and support.  5. Neuropsych: This patient is capable of making decisions on her own behalf.  6. DM type 2: poor control. Increase Lantus to 15 units daily (not done before) with SSI for elevated BS--follow for today.  7. HTN: Monitor every 8 hours. Started catapres TTS 1 patch may need to titrateand labetalol bid.  8. Chronic constipation: Miralax bid as well as senna daily to avoid recurrent issues with constipation and rectal pain. Limit narcotics.  9. CAF: Monitor HR every 8 hours. On coumadin with INR closely monitored by PharmD  10. Iron deficiency anemia: Continue iron supplement bid.  11. Chronic diastolic dysfunction: weights ok. Low salt diet. Continue off lasix 12. CKD: baseline Cr-1.7. BUN with mild elevation. Encourage po fluids..  13. H/o orthostatic hypotension/syncope:  No issues noted thus fars 14. FEN: changed to regular diet per pt request--no issues so far LOS (Days) 6 A FACE TO FACE EVALUATION WAS PERFORMED  SWARTZ,ZACHARY T 02/15/2014, 7:47 AM

## 2014-02-15 NOTE — Progress Notes (Signed)
Physical Therapy Discharge Summary  Patient Details  Name: GENEA RHEAUME MRN: [id number] Date of Birth: [birth year]  Today's Date: 02/15/2014 Time: 7829-5621 Time Calculation (min): 44 min  Patient has met 10 of 10 long term goals due to improved activity tolerance, improved balance, improved postural control, increased strength and decreased pain.  Patient to discharge at an ambulatory level Supervision.   Patient's care partner is independent to provide the necessary physical, cognitive and supervision assistance at discharge.  Reasons goals not met: All goals met  Recommendation:  Patient will benefit from ongoing skilled PT services in home health setting to continue to advance safe functional mobility, address ongoing impairments in core and bilat LE strength, activity tolerance/endurance, postural control, balance, gait, and minimize fall risk.  Equipment: transport w/c  Reasons for discharge: treatment goals met and discharge from hospital  Patient/family agrees with progress made and goals achieved: Yes  PT Discharge Pain Pain Assessment Pain Assessment: No/denies pain Pain Score: 4  Sensation Sensation Light Touch: Impaired Detail Light Touch Impaired Details: Impaired RLE;Impaired LLE Stereognosis: Not tested Hot/Cold: Not tested Proprioception: Impaired by gross assessment Additional Comments: impaired bilat feet secondary to neuropathy Coordination Gross Motor Movements are Fluid and Coordinated: Not tested Motor  Motor Motor - Discharge Observations: Generalized weakness; posterior lean improved  Mobility Bed Mobility Bed Mobility: Supine to Sit;Sit to Supine Supine to Sit: 5: Supervision;HOB flat Sit to Supine: 5: Supervision;HOB flat Sit to Supine - Details (indicate cue type and reason): Supervision on regular flat bed with supervision with verbal cues for safety and sequencing to minimize strain on back Transfers Stand Pivot Transfers: 5:  Supervision Stand Pivot Transfer Details (indicate cue type and reason): Supervision for stand pivot transfers bed <> w/c and w/c <> car min A with verbal cues for safe hand placement when standing or sitting.  Also requires intermittent cues to safely sequence pivoting and to fully pivot prior to sitting Locomotion  Ambulation Ambulation/Gait Assistance: 5: Supervision Ambulation Distance (Feet): 150 Feet Ambulation/Gait Assistance Details: Gait in controlled environment with supervision with RW with intermittent verbal cues for safe obstacle negotiation with RW Gait Gait Pattern: Impaired Gait Pattern: Step-through pattern;Decreased stride length;Shuffle;Narrow base of support;Decreased trunk rotation (LLE ER) Stairs / Additional Locomotion Stairs: Yes Stairs Assistance: 4: Min assist Stairs Assistance Details (indicate cue type and reason): Performed up/down 4 stairs with bilat HHA secondary to this is what pt is used to PTA with daughter and therapist only providing min A for balance during negotiation Stair Management Technique: Two rails;Step to pattern Number of Stairs: 6 Height of Stairs: 6.5 Wheelchair Mobility Wheelchair Assistance: 1: +1 Total assist Wheelchair Parts Management: Needs assistance Distance: 150; family to assist with w/c mobility with transport w/c  Trunk/Postural Assessment  Cervical Assessment Cervical Assessment: Within Functional Limits Thoracic Assessment Thoracic Assessment: Exceptions to [facility] (compression fractures and pain; decreased ROM overall) Lumbar Assessment Lumbar Assessment: Exceptions to WFL (decreased lordosis) Postural Control Righting Reactions: Delayed righting reactions in standing  Balance Standardized Balance Assessment Standardized Balance Assessment: Berg Balance Test Berg Balance Test Sit to Stand: Able to stand using hands after several tries Standing Unsupported: Unable to stand 30 seconds unassisted Sitting with Back  Unsupported but Feet Supported on Floor or Stool: Able to sit safely and securely 2 minutes Stand to Sit: Uses backs of legs against chair to control descent Transfers: Able to transfer with verbal cueing and /or supervision Standing Unsupported with Eyes Closed: Able to stand 3 seconds Standing Ubsupported with  Feet Together: Needs help to attain position and unable to hold for 15 seconds From Standing, Reach Forward with Outstretched Arm: Loses balance while trying/requires external support From Standing Position, Pick up Object from Floor: Unable to try/needs assist to keep balance From Standing Position, Turn to Look Behind Over each Shoulder: Needs assist to keep from losing balance and falling Turn 360 Degrees: Needs assistance while turning Standing Unsupported, Alternately Place Feet on Step/Stool: Needs assistance to keep from falling or unable to try Standing Unsupported, One Foot in Front: Loses balance while stepping or standing Standing on One Leg: Unable to try or needs assist to prevent fall Total Score: 12 Static Standing Balance Static Standing - Balance Support: Bilateral upper extremity supported;No upper extremity supported Static Standing - Level of Assistance: 5: Stand by assistance Dynamic Standing Balance Dynamic Standing - Balance Support: Right upper extremity supported;Left upper extremity supported Dynamic Standing - Level of Assistance: 5: Stand by assistance Extremity Assessment  RLE Strength RLE Overall Strength: Deficits;Due to pain RLE Overall Strength Comments: 3/5 hip flexion, 4/5 knee flexion/extension, ankle DF LLE Strength LLE Overall Strength: Deficits;Due to pain LLE Overall Strength Comments: 3/5 hip flexion, 4/5 knee flexion/extension, ankle DF  See FIM for current functional status  Raylene Everts [facility] 02/15/2014, 12:41 PM

## 2014-02-15 NOTE — Progress Notes (Addendum)
Occupational Therapy Session Notes & Discharge Summary  Patient Details  Name: Lindsay Sheppard MRN: [id number] Date of Birth: 18-Feb-1921  Today's Date: 02/15/2014  SESSION NOTES  Session #1 [phone] - 43 Minutes Individual Therapy Patient with no complaints of pain Patient received seated EOB starting breakfast. Patient willing to finish breakfast after ADL session. Patient stood with RW and ambulated into bathroom for toilet transfer, toileting, then shower stall transfer. Patient requires min verbal cues for safety using RW for functional mobility/transfers. Patient completed UB/LB bathing in sit <>stand position with supervision/set-up assist. Patient then ambulated out of bathroom > EOB for UB/LB dressing in sit<>stand position. Patient transferred EOB>recliner using RW and supervision from therapist. Patient left seated in recliner with breakfast trays and all other needs within reach. Patient plans to discharge > home tomorrow with family. Patient's daughters and sons plan to assist her at home, they were assisting with ADL & IADL tasks PTA.   Session #2 [id number] - 68 Minutes Individual Therapy No complaints of pain, but complaints of upset stomach; RN aware Patient received seated in recliner. Patient stood with RW and ambulated > ADL apartment. Patient performed simple meal prep using RW with minimal assistance, requiring moderate verbal cues for safety. Patient took a few seated rest breaks due to fatigue. Patient then ambulated > therapy gym for therapeutic activity focusing on functional mobility & safety with RW, dynamic standing balance/tolerance/endurance, BUE strengthening exercises, and overall activity tolerance/endurance. Patient ambulated back to room and left seated in recliner with all needs within reach. Patient's daughter present entire session and independent to assist patient at home.    ---------------------------------------------------------------------------------------------------------------------------------------  DISCHARGE SUMMARY  Patient has met 10 of 10 long term goals due to improved activity tolerance, improved balance, postural control, ability to compensate for deficits, improved attention, improved awareness and improved coordination.  Patient to discharge at overall supervision level.  Patient's care partner is independent to provide the necessary supervision > min assistance at discharge.    Reasons goals not met: n/a, all goals met at this time.  Recommendation:  Patient will benefit from ongoing skilled OT services in home health setting to continue to advance functional skills in the area of BADL, iADL and Reduce care partner burden.  Equipment: BSC  Reasons for discharge: treatment goals met and discharge from hospital  Patient/family agrees with progress made and goals achieved: Yes  Precautions/Restrictions  Precautions Precautions: Fall Restrictions Weight Bearing Restrictions: No  ADL - See FIM  Vision/Perception  Vision- History Baseline Vision/History: Wears glasses Wears Glasses: At all times Patient Visual Report: No change from baseline Vision- Assessment Vision Assessment?: No apparent visual deficits   Cognition Overall Cognitive Status: Within Functional Limits for tasks assessed Arousal/Alertness: Awake/alert Orientation Level: Oriented X4 Memory: Impaired Memory Impairment: Decreased recall of new information;Decreased short term memory Awareness: Appears intact Problem Solving: Appears intact Safety/Judgment: Appears intact  Sensation Sensation Additional Comments: BUEs appear intact Coordination Gross Motor Movements are Fluid and Coordinated: Yes Fine Motor Movements are Fluid and Coordinated: Yes  Motor  Motor Motor - Discharge Observations: Generalized weakness; posterior lean improved  Mobility   Bed Mobility Bed Mobility: Supine to Sit;Sit to Supine Supine to Sit: 5: Supervision;HOB flat Sit to Supine: 5: Supervision;HOB flat Sit to Supine - Details (indicate cue type and reason): Supervision on regular flat bed with supervision with verbal cues for safety and sequencing to minimize strain on back   Trunk/Postural Assessment  Cervical Assessment Cervical Assessment: Within Functional Limits Thoracic Assessment Thoracic  Assessment: Exceptions to [facility] (compression fractures and pain; decreased ROM overall) Lumbar Assessment Lumbar Assessment: Exceptions to WFL (decreased lordosis) Postural Control Righting Reactions: Delayed righting reactions in standing   Balance Standardized Balance Assessment Standardized Balance Assessment: Berg Balance Test Berg Balance Test Sit to Stand: Able to stand using hands after several tries Standing Unsupported: Unable to stand 30 seconds unassisted Sitting with Back Unsupported but Feet Supported on Floor or Stool: Able to sit safely and securely 2 minutes Stand to Sit: Uses backs of legs against chair to control descent Transfers: Able to transfer with verbal cueing and /or supervision Standing Unsupported with Eyes Closed: Able to stand 3 seconds Standing Ubsupported with Feet Together: Needs help to attain position and unable to hold for 15 seconds From Standing, Reach Forward with Outstretched Arm: Loses balance while trying/requires external support From Standing Position, Pick up Object from Floor: Unable to try/needs assist to keep balance From Standing Position, Turn to Look Behind Over each Shoulder: Needs assist to keep from losing balance and falling Turn 360 Degrees: Needs assistance while turning Standing Unsupported, Alternately Place Feet on Step/Stool: Needs assistance to keep from falling or unable to try Standing Unsupported, One Foot in Front: Loses balance while stepping or standing Standing on One Leg: Unable to try or  needs assist to prevent fall Total Score: 12 Static Standing Balance Static Standing - Balance Support: Bilateral upper extremity supported;No upper extremity supported Static Standing - Level of Assistance: 5: Stand by assistance Dynamic Standing Balance Dynamic Standing - Balance Support: Right upper extremity supported;Left upper extremity supported Dynamic Standing - Level of Assistance: 5: Stand by assistance  Extremity/Trunk Assessment RUE Assessment RUE Assessment: Within Functional Limits LUE Assessment LUE Assessment: Within Functional Limits  See FIM for current functional status  CLAY,PATRICIA 02/15/2014, 3:24 PM

## 2014-02-16 LAB — GLUCOSE, CAPILLARY: Glucose-Capillary: 107 mg/dL — ABNORMAL HIGH (ref 70–99)

## 2014-02-16 LAB — PROTIME-INR
INR: 2.31 — AB (ref 0.00–1.49)
PROTHROMBIN TIME: 24.6 s — AB (ref 11.6–15.2)

## 2014-02-16 MED ORDER — WARFARIN SODIUM 2 MG PO TABS: ORAL_TABLET | ORAL | Status: DC

## 2014-02-16 MED ORDER — LABETALOL HCL 200 MG PO TABS: ORAL_TABLET | ORAL | Status: DC

## 2014-02-16 MED ORDER — WARFARIN SODIUM 4 MG PO TABS
4.0000 mg | ORAL_TABLET | Freq: Once | ORAL | Status: DC
  Filled 2014-02-16: qty 1

## 2014-02-16 NOTE — Progress Notes (Signed)
Subjective/Complaints: 80 y.o. right-handed female with history of HTN, DM type 2, postural hypotension, CAF--chronic coumadin, chronic back pain who fell while getting of the commode on 02/07/14 (question due to slipping or syncopal episode) and had onset of severe back pain. She was admitted for work up and Cranial CT scan negative for acute changes. Cardiac enzymes negative and 2D echocardiogram with ejection fraction of 55% and normal systolic function. CT of spine with cervical spondylolisthesis C6 on C7, multilevel compression fractures T3, T7, T11, T12, L2, severe DDD L4/L5 and old sacral fracture with new sclerotic lesion left sacral ala  Restless evening. Back was sore.  Review of Systems - otherwise negative Objective: Vital Signs: Blood pressure 156/60, pulse 86, temperature 97.7 F (36.5 C), temperature source Oral, resp. rate 18, height 5\' 6"  (1.676 m), weight 55.883 kg (123 lb 3.2 oz), SpO2 97.00%. No results found. Results for orders placed during the hospital encounter of 02/09/14 (from the past 72 hour(s))  PROTIME-INR     Status: Abnormal   Collection Time    02/13/14  7:50 AM      Result Value Ref Range   Prothrombin Time 17.1 (*) 11.6 - 15.2 seconds   INR 1.43  0.00 - 1.49  GLUCOSE, CAPILLARY     Status: Abnormal   Collection Time    02/13/14 11:31 AM      Result Value Ref Range   Glucose-Capillary 228 (*) 70 - 99 mg/dL   Comment 1 Notify RN    GLUCOSE, CAPILLARY     Status: Abnormal   Collection Time    02/13/14  4:22 PM      Result Value Ref Range   Glucose-Capillary 246 (*) 70 - 99 mg/dL  GLUCOSE, CAPILLARY     Status: Abnormal   Collection Time    02/13/14  9:42 PM      Result Value Ref Range   Glucose-Capillary 203 (*) 70 - 99 mg/dL  PROTIME-INR     Status: Abnormal   Collection Time    02/14/14  5:50 AM      Result Value Ref Range   Prothrombin Time 19.7 (*) 11.6 - 15.2 seconds   INR 1.72 (*) 0.00 - 1.49  CBC     Status: Abnormal   Collection Time     02/14/14  5:50 AM      Result Value Ref Range   WBC 3.5 (*) 4.0 - 10.5 K/uL   RBC 3.75 (*) 3.87 - 5.11 MIL/uL   Hemoglobin 10.8 (*) 12.0 - 15.0 g/dL   HCT 34.1 (*) 36.0 - 46.0 %   MCV 90.9  78.0 - 100.0 fL   MCH 28.8  26.0 - 34.0 pg   MCHC 31.7  30.0 - 36.0 g/dL   RDW 15.8 (*) 11.5 - 15.5 %   Platelets 164  150 - 400 K/uL  GLUCOSE, CAPILLARY     Status: Abnormal   Collection Time    02/14/14  7:23 AM      Result Value Ref Range   Glucose-Capillary 160 (*) 70 - 99 mg/dL   Comment 1 Notify RN    GLUCOSE, CAPILLARY     Status: Abnormal   Collection Time    02/14/14 11:06 AM      Result Value Ref Range   Glucose-Capillary 254 (*) 70 - 99 mg/dL   Comment 1 Notify RN    GLUCOSE, CAPILLARY     Status: Abnormal   Collection Time    02/14/14  4:27 PM  Result Value Ref Range   Glucose-Capillary 212 (*) 70 - 99 mg/dL  GLUCOSE, CAPILLARY     Status: Abnormal   Collection Time    02/14/14  9:16 PM      Result Value Ref Range   Glucose-Capillary 238 (*) 70 - 99 mg/dL  PROTIME-INR     Status: Abnormal   Collection Time    02/15/14  4:00 AM      Result Value Ref Range   Prothrombin Time 21.4 (*) 11.6 - 15.2 seconds   INR 1.92 (*) 0.00 - 1.49  GLUCOSE, CAPILLARY     Status: Abnormal   Collection Time    02/15/14  7:14 AM      Result Value Ref Range   Glucose-Capillary 160 (*) 70 - 99 mg/dL  GLUCOSE, CAPILLARY     Status: Abnormal   Collection Time    02/15/14 11:14 AM      Result Value Ref Range   Glucose-Capillary 290 (*) 70 - 99 mg/dL   Comment 1 Notify RN    GLUCOSE, CAPILLARY     Status: Abnormal   Collection Time    02/15/14  5:05 PM      Result Value Ref Range   Glucose-Capillary 148 (*) 70 - 99 mg/dL  GLUCOSE, CAPILLARY     Status: Abnormal   Collection Time    02/15/14  9:11 PM      Result Value Ref Range   Glucose-Capillary 225 (*) 70 - 99 mg/dL  PROTIME-INR     Status: Abnormal   Collection Time    02/16/14  5:00 AM      Result Value Ref Range    Prothrombin Time 24.6 (*) 11.6 - 15.2 seconds   INR 2.31 (*) 0.00 - 1.49      Physical Exam  Constitutional: She is oriented to person, place, and time.  HENT:  Head: Normocephalic.  Eyes: EOM are normal.  Neck: Normal range of motion. Neck supple. No thyromegaly present.  Cardiovascular:  Cardiac rate controlled, reg rhythm Respiratory: Effort normal and breath sounds normal. No respiratory distress.  GI: Soft. Bowel sounds are normal. She exhibits no distension.  Neurological: She is alert and oriented to person, place, and time.   HOH. Reasonable insight and awareness. Moves all 4's.  UE's 5/5. HF 2+, KE 3+, ADF/APF 4/5. Stocking glove sensory loss to above the calves.  Musc: low back tender to palpation and flexion Skin: Skin is warm and dry.  Psychiatric: She has a normal mood and affect. Her behavior is normal. Thought content normal.    Assessment/Plan: 1. Functional deficits secondary to Fall resulting in lumbar and thoracic compression fractures which require 3+ hours per day of interdisciplinary therapy in a comprehensive inpatient rehab setting. Physiatrist is providing close team supervision and 24 hour management of active medical problems listed below. Physiatrist and rehab team continue to assess barriers to discharge/monitor patient progress toward functional and medical goals. FIM: FIM - Bathing Bathing Steps Patient Completed: Chest;Right Arm;Left Arm;Abdomen;Right upper leg;Left upper leg;Front perineal area;Right lower leg (including foot);Left lower leg (including foot);Buttocks Bathing: 5: Supervision: Safety issues/verbal cues  FIM - Upper Body Dressing/Undressing Upper body dressing/undressing steps patient completed: Thread/unthread right sleeve of pullover shirt/dresss;Thread/unthread left sleeve of pullover shirt/dress;Put head through opening of pull over shirt/dress;Pull shirt over trunk Upper body dressing/undressing: 6: Assistive device (Comment) FIM -  Lower Body Dressing/Undressing Lower body dressing/undressing steps patient completed: Thread/unthread right underwear leg;Thread/unthread left underwear leg;Thread/unthread right pants leg;Thread/unthread left pants  leg;Pull underwear up/down;Pull pants up/down;Don/Doff right sock;Don/Doff right shoe;Don/Doff left shoe;Fasten/unfasten right shoe;Fasten/unfasten left shoe;Don/Doff left sock Lower body dressing/undressing: 4: Min-Patient completed 75 plus % of tasks (minimally assisted with bilateral socks and shoes)  FIM - Toileting Toileting steps completed by patient: Adjust clothing prior to toileting;Performs perineal hygiene;Adjust clothing after toileting Toileting Assistive Devices: Grab bar or rail for support Toileting: 5: Supervision: Safety issues/verbal cues  FIM - Radio producer Devices: Elevated toilet seat;Grab bars;Walker Toilet Transfers: 5-To toilet/BSC: Supervision (verbal cues/safety issues);5-From toilet/BSC: Supervision (verbal cues/safety issues)  FIM - Control and instrumentation engineer Devices: Copy: 5: Supine > Sit: Supervision (verbal cues/safety issues);5: Sit > Supine: Supervision (verbal cues/safety issues);5: Bed > Chair or W/C: Supervision (verbal cues/safety issues);5: Chair or W/C > Bed: Supervision (verbal cues/safety issues)  FIM - Locomotion: Wheelchair Distance: 150; family to assist with w/c mobility with transport w/c Locomotion: Wheelchair: 1: Total Assistance/staff pushes wheelchair (Pt<25%) FIM - Locomotion: Ambulation Locomotion: Ambulation Assistive Devices: Administrator Ambulation/Gait Assistance: 5: Supervision Locomotion: Ambulation: 5: Travels 150 ft or more with supervision/safety issues  Comprehension Comprehension Mode: Auditory Comprehension: 6-Follows complex conversation/direction: With extra time/assistive device  Expression Expression Mode: Verbal Expression:  6-Expresses complex ideas: With extra time/assistive device  Social Interaction Social Interaction: 6-Interacts appropriately with others with medication or extra time (anti-anxiety, antidepressant).  Problem Solving Problem Solving: 6-Solves complex problems: With extra time  Memory Memory: 6-More than reasonable amt of time   Medical Problem List and Plan:  1. Functional deficits secondary to lumbar compression and sacral fxs (acute on chronic) exacerbated by recent fall  2. DVT Prophylaxis/Anticoagulation: Pharmaceutical: Coumadin  3. Pain Management: Will continue tylenol 650 mg tid. Ice and/or heat prn for comfort. Oxycodone prn for severe pain increased to 5mg   -  kpad   4. Mood: LCSW to follow for evaluation and support.  5. Neuropsych: This patient is capable of making decisions on her own behalf.  6. DM type 2: poor control. Increasde Lantus to 15 units daily (not done before) with SSI for elevated BS--  -further adjustment once home.  7. HTN: Monitor every 8 hours. Started catapres TTS 1 patch may need to titrateand labetalol bid.  8. Chronic constipation: Miralax bid as well as senna daily to avoid recurrent issues with constipation and rectal pain. Limit narcotics.  9. CAF: Monitor HR every 8 hours. On coumadin with INR closely monitored by PharmD  10. Iron deficiency anemia: Continue iron supplement bid.  11. Chronic diastolic dysfunction: weights ok. Low salt diet. Continue off lasix 12. CKD: baseline Cr-1.7. BUN with mild elevation. Encourage po fluids..  13. H/o orthostatic hypotension/syncope: tolerating activities 14. FEN: changed to regular diet per pt request--no issues so far LOS (Days) 7 A FACE TO FACE EVALUATION WAS PERFORMED  SWARTZ,ZACHARY T 02/16/2014, 7:49 AM

## 2014-02-16 NOTE — Discharge Summary (Signed)
Physician Discharge Summary  Patient ID: Lindsay Sheppard MRN: [id number] DOB/AGE: 1920-11-10 80 y.o.  Admit date: 02/09/2014 Discharge date: 02/16/2014  Discharge Diagnoses:  Principal Problem:   Fractures Active Problems:   HYPERTENSION   Atrial fibrillation   RENAL INSUFFICIENCY   Lumbar back pain   Chronic constipation   CKD (chronic kidney disease) 3-4   Discharged Condition: Stable   Labs:  Basic Metabolic Panel:  Recent Labs Lab 02/10/14 0705  NA 140  K 4.8  CL 104  CO2 23  GLUCOSE 197*  BUN 42*  CREATININE 1.54*  CALCIUM 9.5    CBC:  Recent Labs Lab 02/10/14 0705 02/14/14 0550  WBC 5.1 3.5*  NEUTROABS 3.7  --   HGB 11.6* 10.8*  HCT 36.2 34.1*  MCV 90.0 90.9  PLT 123* 164    CBG:  Recent Labs Lab 02/15/14 0714 02/15/14 1114 02/15/14 1705 02/15/14 2111 02/16/14 0729  GLUCAP 160* 290* 148* 225* 107*     Filed Vitals:   02/15/14 1947 02/16/14 0546 02/16/14 0803 02/16/14 0914  BP: 156/60  184/88 159/74  Pulse: 86  90 90  Temp:  97.7 F (36.5 C)    TempSrc:  Oral    Resp:  18    Height:      Weight:  55.883 kg (123 lb 3.2 oz)    SpO2:  97%  95%    Brief HPI:   Lindsay Sheppard is a 80 y.o. right-handed female with history of HTN, DM type 2, postural hypotension, CAF--chronic coumadin, chronic back pain who fell while getting of the commode on 02/07/14 (question due to slipping or syncopal episode) and had onset of severe back pain. She was admitted for work up and Cranial CT scan negative for acute changes. Cardiac enzymes negative and 2D echocardiogram with ejection fraction of 55% and normal systolic function. CT of spine with cervical spondylolisthesis C6 on C7, multilevel compression fractures T3, T7, T11, T12, L2, severe DDD L4/L5 and old sacral fracture with new sclerotic lesion left sacral ala.  Therapy initiated and CIR recommended by rehab team.    Hospital Course: [name] was admitted to rehab 02/09/2014 for inpatient  therapies to consist of PT, ST and OT at least three hours five days a week. Past admission physiatrist, therapy team and rehab RN have worked together to provide customized collaborative inpatient rehab. Pain was managed with scheduled tylenol, local care with sportscreme as well as  Oxycodone on prn basis. She was started on bowel program to help with acute on chronic constipation issues. She has not used any narcotics in the last 2-3 days and family is advised to continue scheduled tylenol for pain management. She was started on bowel program to help with constipation and has needed encouragement to push po fluids. Blood pressures were monitored every 8 hours with orthostatic check daily in am. She is noted to have a drop of 30-40 points from sitting to standing and was advised to change positions slowly to avoid orthostatic symptoms. catapres has been reduced to TTS1 and lasix was discontinued. Blood sugars have been monitored on ac/hs basis and metformin was discontinued due to CKD. Lantus was titrated to 15 units in am with BS down to 107 this am. She was cross covered with Lovenox till INR therapeutic. INR is therapeutic at discharge and she was discharged on 3 mg coumadin daily. HHRN to draw  protime in the am with results to Dr. Jenny Reichmann. Patient has made good progress and  is at supervision level overall. [facility] to provide PT, OT and RN for follow up past discharge.    Rehab course: During patient's stay in rehab weekly team conferences were held to monitor patient's progress, set goals and discuss barriers to discharge. Patient has had improvement in activity tolerance, balance, postural control, as well as ability to compensate for deficits. She requires supervision with cues for  ADL tasks as well as mobility. She is able to ambulate 150 feet with RW and supervision and intermittent cues for safe RW use. She requires min assist for car transfers as well as stair navigation with cues for safety.  Family education was done with daughter who will provide assistance as needed past discharge.   Disposition: Home  Diet: Diabetic.   Special Instructions: 1. Need to take time when changing positions--wait about a minute when going from sitting to standing before moving about.  2. Check weight daily and watch for signs of fluid overload.  3. Check blood sugars 2-3 times a day and record.      Medication List    STOP taking these medications       cloNIDine 0.3 mg/24hr patch  Commonly known as:  CATAPRES - Dosed in mg/24 hr  Replaced by:  cloNIDine 0.1 mg/24hr patch     oxyCODONE 5 MG immediate release tablet  Commonly known as:  Oxy IR/ROXICODONE     senna 8.6 MG Tabs tablet  Commonly known as:  SENOKOT     sulfamethoxazole-trimethoprim 800-160 MG per tablet  Commonly known as:  BACTRIM DS,SEPTRA DS      TAKE these medications       acetaminophen 325 MG tablet  Commonly known as:  TYLENOL  Take 2 tablets (650 mg total) by mouth 3 (three) times daily.     ARTIFICIAL TEARS OP  Place 1 drop into both eyes daily.     bisacodyl 10 MG suppository  Commonly known as:  DULCOLAX  Place 1 suppository (10 mg total) rectally daily as needed for moderate constipation.     cloNIDine 0.1 mg/24hr patch  Commonly known as:  CATAPRES - Dosed in mg/24 hr  Place 1 patch (0.1 mg total) onto the skin every 7 (seven) days. On mondays     ferrous sulfate 325 (65 FE) MG tablet  Take 1 tablet (325 mg total) by mouth 2 (two) times daily with a meal.     fexofenadine 180 MG tablet  Commonly known as:  ALLEGRA  Take 180 mg by mouth every evening.     fluticasone 50 MCG/ACT nasal spray  Commonly known as:  FLONASE  Place 2 sprays into the nose daily.     insulin glargine 100 UNIT/ML injection  Commonly known as:  LANTUS  Inject 0.15 mLs (15 Units total) into the skin daily. Inject 7 units subcutaneously once a day     labetalol 200 MG tablet  Commonly known as:  NORMODYNE  Take two  pills in morning and two pills at bedtime daily     pantoprazole 40 MG tablet  Commonly known as:  PROTONIX  Take 1 tablet (40 mg total) by mouth daily.     polyethylene glycol packet  Commonly known as:  MIRALAX / GLYCOLAX  Take 17 g by mouth 2 (two) times daily.     pravastatin 40 MG tablet  Commonly known as:  PRAVACHOL  Take 0.5 tablets (20 mg total) by mouth daily. 1/2 daily     sodium phosphate 7-19  GM/118ML Enem  Place 133 mLs (1 enema total) rectally daily as needed for severe constipation.     VITAMIN B-12 IJ  Inject as directed every 30 (thirty) days.     warfarin 2 MG tablet  Commonly known as:  COUMADIN  Take 1 1/2 pill daily with supper       Follow-up Information   Call Meredith Staggers, MD. (As needed)    Specialty:  Physical Medicine and Rehabilitation   Contact information:   [address], [address] [place] [address] [phone]       Follow up with [name], MD On 02/23/2014. (@ 10:15 AM)    Specialties:  Internal Medicine, Radiology   Contact information:   [place] [address] [phone]       Signed: Bary Leriche 02/16/2014, 10:34 AM

## 2014-02-16 NOTE — Discharge Instructions (Signed)
Inpatient Rehab Discharge Instructions  Lindsay Sheppard Discharge date and time:  02/16/14  Activities/Precautions/ Functional Status: Activity: activity as tolerated Diet: diabetic diet Drink at least 1500 cc fluids daily.  Wound Care: Not needed.   Functional status:  ___ No restrictions     ___ Walk up steps independently _X__ 24/7 supervision/assistance   ___ Walk up steps with assistance ___ Intermittent supervision/assistance  ___ Bathe/dress independently _X__ Walk with walker    _X__ Bathe/dress with assistance ___ Walk Independently    ___ Shower independently _X__ Walk with assistance    ___ Shower with assistance ___ No alcohol     ___ Return to work/school ________   COMMUNITY REFERRALS UPON DISCHARGE:   Home Health:   PT     OT     RN     Agency:  [place] Phone:  [phone] Medical Equipment/Items Ordered:  18x18 transport wheelchair and bedside commode  Agency/Supplier:  [place]         Phone:  [phone]  Special Instructions: 1. Need to take time when changing positions--wait about a minute when going from sitting to standing before moving about.  2. Check weight daily and watch for signs of fluid build up. See instructions below:    Heart Failure Heart failure means your heart has trouble pumping blood. This makes it hard for your body to work well. Heart failure is usually a long-term (chronic) condition. You must take good care of yourself and follow your doctor's treatment plan. HOME CARE  Take your heart medicine as told by your doctor.  Do not stop taking medicine unless your doctor tells you to.  Do not skip any dose of medicine.  Refill your medicines before they run out.  Take other medicines only as told by your doctor or pharmacist.  Stay active if told by your doctor. The elderly and people with severe heart failure should talk with a doctor about physical activity.  Eat heart healthy foods. Choose foods that are without  trans fat and are low in saturated fat, cholesterol, and salt (sodium). This includes fresh or frozen fruits and vegetables, fish, lean meats, fat-free or low-fat dairy foods, whole grains, and high-fiber foods. Lentils and dried peas and beans (legumes) are also good choices.  Limit salt if told by your doctor.  Cook in a healthy way. Roast, grill, broil, bake, poach, steam, or stir-fry foods.  Limit fluids as told by your doctor.  Weigh yourself every morning. Do this after you pee (urinate) and before you eat breakfast. Write down your weight to give to your doctor.  Take your blood pressure and write it down if your doctor tell you to.  Ask your doctor how to check your pulse. Check your pulse as told.  Lose weight if told by your doctor.  Stop smoking or chewing tobacco. Do not use gum or patches that help you quit without your doctor's approval.  Schedule and go to doctor visits as told.  Nonpregnant women should have no more than 1 drink a day. Men should have no more than 2 drinks a day. Talk to your doctor about drinking alcohol.  Stop illegal drug use.  Stay current with shots (immunizations).  Manage your health conditions as told by your doctor.  Learn to manage your stress.  Rest when you are tired.  If it is really hot outside:  Avoid intense activities.  Use air conditioning or fans, or get in a cooler place.  Avoid caffeine and alcohol.  Wear loose-fitting, lightweight, and light-colored clothing.  If it is really cold outside:  Avoid intense activities.  Layer your clothing.  Wear mittens or gloves, a hat, and a scarf when going outside.  Avoid alcohol.  Learn about heart failure and get support as needed.  Get help to maintain or improve your quality of life and your ability to care for yourself as needed. GET HELP IF:   You gain 03 lb/1.4 kg or more in 1 day or 05 lb/2.3 kg in a week.  You are more short of breath than usual.  You cannot  do your normal activities.  You tire easily.  You cough more than normal, especially with activity.  You have any or more puffiness (swelling) in areas such as your hands, feet, ankles, or belly (abdomen).  You cannot sleep because it is hard to breathe.  You feel like your heart is beating fast (palpitations).  You get dizzy or lightheaded when you stand up. GET HELP RIGHT AWAY IF:   You have trouble breathing.  There is a change in mental status, such as becoming less alert or not being able to focus.  You have chest pain or discomfort.  You faint. MAKE SURE YOU:   Understand these instructions.  Will watch your condition.  Will get help right away if you are not doing well or get worse. Document Released: 05/27/2008 Document Revised: 12/13/2012 Document Reviewed: 03/18/2012 [facility] Patient Information 2015 [place], [place]. This information is not intended to replace advice given to you by your health care provider. Make sure you discuss any questions you have with your health care provider.   My questions have been answered and I understand these instructions. I will adhere to these goals and the provided educational materials after my discharge from the hospital.  Patient/Caregiver Signature _______________________________ Date __________  Clinician Signature _______________________________________ Date __________  Please bring this form and your medication list with you to all your follow-up doctor's appointments.

## 2014-02-16 NOTE — Progress Notes (Signed)
Patient and family received discharge instructions from Pam Love, PA-C with verbal understanding. Patient discharged to home with family and belongings. 

## 2014-02-16 NOTE — Progress Notes (Signed)
ANTICOAGULATION CONSULT NOTE - Follow Up Consult  Pharmacy Consult for coumadin and lovenox Indication: atrial fibrillation  Allergies  Allergen Reactions  . Amiodarone     REACTION: f? fluid retention  . Deltasone [Prednisone]   . Diazepam     REACTION: agitation  . Diltiazem Hcl   . Levofloxacin     REACTION: nausea  . Pregabalin     REACTION: hallucinations  . Spironolactone     REACTION: elevated K at lowest dose  . Tequin [Gatifloxacin]     Patient Measurements: Height: 5\' 6"  (167.6 cm) Weight: 123 lb 3.2 oz (55.883 kg) IBW/kg (Calculated) : 59.3 Heparin Dosing Weight:   Vital Signs: Temp: 97.7 F (36.5 C) (06/18 0546) Temp src: Oral (06/18 0546) BP: 184/88 mmHg (06/18 0803) Pulse Rate: 90 (06/18 0803)  Labs:  Recent Labs  02/14/14 0550 02/15/14 0400 02/16/14 0500  HGB 10.8*  --   --   HCT 34.1*  --   --   PLT 164  --   --   LABPROT 19.7* 21.4* 24.6*  INR 1.72* 1.92* 2.31*    Estimated Creatinine Clearance: 21.4 ml/min (by C-G formula based on Cr of 1.54).   Medications:  Scheduled:  . acetaminophen  650 mg Oral TID  . cloNIDine  0.1 mg Transdermal Q7 days  . enoxaparin (LOVENOX) injection  50 mg Subcutaneous Q24H  . feeding supplement (GLUCERNA SHAKE)  237 mL Oral TID BM  . ferrous sulfate  325 mg Oral BID WC  . fluticasone  2 spray Each Nare Daily  . insulin aspart  0-9 Units Subcutaneous TID WC  . insulin glargine  15 Units Subcutaneous Daily  . labetalol  400 mg Oral BID  . loratadine  10 mg Oral Daily  . MUSCLE RUB  1 application Topical BID  . pantoprazole  40 mg Oral Daily  . polyethylene glycol  17 g Oral BID  . senna  2 tablet Oral Daily  . simvastatin  10 mg Oral q1800  . Warfarin - Pharmacist Dosing Inpatient   Does not apply q1800   Infusions:    Assessment: 80 yo female with afib is currently on therapeutic coumadin.  Patient is also on treatment dose of lovenox.  INR today is 2.31.  Goal of Therapy:  Anti-Xa level 0.6-1  units/ml 4hrs after LMWH dose given; INR 2-3 Monitor platelets by anticoagulation protocol: Yes   Plan:  1) Coumadin 4 mg po x1 2) Daily PT/INR 3) d/c lovenox  So, Tsz-Yin 02/16/2014,8:18 AM

## 2014-02-17 ENCOUNTER — Telehealth: Payer: Self-pay

## 2014-02-17 NOTE — Progress Notes (Signed)
Social Work Discharge Note  The overall goal for the admission was met for:   Discharge location: Yes - home with family 24/7  Length of Stay: Yes - 7 days  Discharge activity level: Yes - supervision with some minimal assistance  Home/community participation: Yes   Services provided included: MD, RD, PT, OT, RN, TR, Pharmacy and SW  Financial Services: Medicare and Private Insurance: [place]  Follow-up services arranged: Home Health: PT/OT/RN, DME: [phone] transport chair, bedside commode and Patient/Family request agency HH: [facility], DME: [facility] [place]  Comments (or additional information):  Patient/Family verbalized understanding of follow-up arrangements: Yes  Individual responsible for coordination of the follow-up plan: pt and dtrs  Confirmed correct DME delivered: Trey Sailors 02/17/2014    Prevatt, Silvestre Mesi

## 2014-02-17 NOTE — Progress Notes (Signed)
Social Work Patient ID: Lindsay Sheppard, female   DOB: February 06, 1921, 80 y.o.   MRN: [id number]  CSW met with pt and her dtr, Lindsay Sheppard, on 02-14-14 to update her on team conference discussion.  Pt felt unsure about d/c at first discussion, but CSW followed up with pt on 02-15-14 and she was feeling more confident.  Pt's dtr has a funeral on 02-16-14, so dtr Lindsay Sheppard, will take pt home or Lindsay Sheppard will come after the funeral in the afternoon.  CSW ordered DME and arranged [place] and requested PT pt had in the past.  Pt will have 24/7 care from her children.  No other needs identified, but CSW remains available.

## 2014-02-17 NOTE — Telephone Encounter (Signed)
noted 

## 2014-02-17 NOTE — Telephone Encounter (Signed)
[facility] called to add to original msg. PT was 24.5 today

## 2014-02-17 NOTE — Patient Care Conference (Signed)
Inpatient RehabilitationTeam Conference and Plan of Care Update Date: 02/14/2014   Time: 2:30 PM    Patient Name: Lindsay Sheppard      Medical Record Number: [id number]  Date of Birth: [birth year] Sex: Female         Room/Bed: 4M01C/4M01C-01 Payor Info: Payor: MEDICARE / Plan: MEDICARE PART A AND B / Product Type: *No Product type* /    Admitting Diagnosis: YO COMP FXS  S P  FALL  Admit Date/Time:  02/09/2014  4:35 PM Admission Comments: No comment available   Primary Diagnosis:  Fractures Principal Problem: Fractures  Patient Active Problem List   Diagnosis Date Noted  . 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 Supratherapeutic INR 02/09/2014  . Recurrent UTI 02/09/2014  . Fractures 02/09/2014  . Chronic constipation 02/08/2014  . Hyperkalemia 02/07/2014  . Fall 02/07/2014  . Compression fracture 02/07/2014  . Syncope 02/07/2014  . Encounter for therapeutic drug monitoring 09/27/2013  . Foot pain 09/27/2013  . Rectal pain 09/27/2013  . Hearing loss, right 09/27/2013  . Left foot pain 05/12/2013  . Left shoulder pain 03/22/2013  . Abdominal  pain, other specified site 06/23/2012  . Fever 03/24/2012  . Lumbar back pain 03/10/2012  . Gait disorder 03/10/2012  . Fatigue 09/04/2011  . 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 Preventative health care 11/29/2010  . Pulmonary embolism 10/14/2010  . Other specified forms of hearing loss 05/30/2010  . DIASTOLIC HEART FAILURE, CHRONIC 12/14/2009  . POSTHERPETIC NEURALGIA 11/02/2009  . ALLERGIC RHINITIS 11/02/2009  . RENAL INSUFFICIENCY 07/02/2009  . POSTURAL HYPOTENSION 02/07/2008  . SYNCOPE 02/07/2008  . DEPRESSIVE DISORDER 01/25/2008  . CONSTIPATION 01/25/2008  . DIABETES MELLITUS, TYPE II 09/06/2007  . RENAL CYST, LEFT 09/06/2007  . B12 DEFICIENCY 05/23/2007  . HYPERLIPIDEMIA 05/23/2007  . Atrial fibrillation 05/23/2007  . OSTEOPOROSIS 05/23/2007  . SHINGLES, HX OF 05/23/2007  . ANEMIA-IRON DEFICIENCY 01/26/2007  .  HYPERTENSION 01/26/2007  . GERD 01/26/2007    Expected Discharge Date: Expected Discharge Date: 02/16/14  Team Members Present: Physician leading conference: Dr. Alger Simons Social Worker Present: Alfonse Alpers, LCSW Nurse Present: Elliot Cousin, RN PT Present: Raylene Everts, PT OT Present: Blanchard Mane, OT;Jennifer Tamala Julian, OT;Patricia Lissa Hoard, OT SLP Present: Weston Anna, SLP Other (Discipline and Name): Danne Baxter, RN PPS Coordinator present : Daiva Nakayama, RN, CRRN;Becky Alwyn Ren, PT     Current Status/Progress Goal Weekly Team Focus  Medical   acute on chronic lumbar fxs, sacral fx, gait disorder  improve activity tolerance  pain management, nutrition   Bowel/Bladder   Continent bowel and bladder  Remain continent  Monitor   Swallow/Nutrition/ Hydration             ADL's   overall supervision> occasional steady assist  overall supervision>min assist; may upgrade some goals  ADL retraining, functional mobility/transfers, overall activity toleranc/endurance, family education   Mobility   min A to supervison for transfers and gait, pt fatigues quickly "never gets to rest"  Min A to Mod I (close to reaching all LTGs)  strengthing/endurance, transfers   Communication             Safety/Cognition/ Behavioral Observations  No unsafe behaviors; anticipates needs and calls appropriately for assistance  No falls, injuries this admission.  Follow safety plan; pt. education   Pain   Occasional 5 mg Oxy IR.  Pain managed at goal 3/10  Monitor.   Skin   No issues; C.D.I..  Remain free from injury, infection.  Monitor,  assist with repositioning as needed.    Rehab Goals Patient on target to meet rehab goals: Yes Rehab Goals Revised: None *See Care Plan and progress notes for long and short-term goals.  Barriers to Discharge: pain, activity tolerance,    Possible Resolutions to Barriers:  stamina training, family ed, pain mgt    Discharge Planning/Teaching Needs:   Pt to return to her home where her son lives and family members will take turns being with pt 24/7.  Dtrs have been present for therapies.   Team Discussion:  Pt was admitted for deconditioning and syncopal episodes.  Pt continues to have some pain, but team is working on managing the pain better and increasing activity tolerance.  She will be ready to go on 02-16-14.  Revisions to Treatment Plan:  None   Continued Need for Acute Rehabilitation Level of Care: The patient requires daily medical management by a physician with specialized training in physical medicine and rehabilitation for the following conditions: Daily direction of a multidisciplinary physical rehabilitation program to ensure safe treatment while eliciting the highest outcome that is of practical value to the patient.: Yes Daily medical management of patient stability for increased activity during participation in an intensive rehabilitation regime.: Yes Daily analysis of laboratory values and/or radiology reports with any subsequent need for medication adjustment of medical intervention for : Post surgical problems;Cardiac problems  Lindsay Sheppard, Lindsay Sheppard 02/17/2014, 1:24 PM

## 2014-02-17 NOTE — Progress Notes (Signed)
Social Work Patient ID: Lindsay Sheppard, female   DOB: Dec 10, 1920, 80 y.o.   MRN: [id number]  Lindsay Mesi Maurilio Puryear, LCSW Social Worker Signed  Patient Care Conference Service date: 02/17/2014 11:07 AM  Inpatient RehabilitationTeam Conference and Plan of Care Update Date: 02/14/2014   Time: 2:30 PM     Patient Name: Lindsay Sheppard       Medical Record Number: [id number]   Date of Birth: [birth year] Sex: Female         Room/Bed: 4M01C/4M01C-01 Payor Info: Payor: MEDICARE / Plan: MEDICARE PART A AND B / Product Type: *No Product type* /   Admitting Diagnosis: YO COMP FXS  S P  FALL   Admit Date/Time:  02/09/2014  4:35 PM Admission Comments: No comment available   Primary Diagnosis:  Fractures Principal Problem: Fractures    Patient Active Problem List     Diagnosis  Date Noted   .  CKD (chronic kidney disease) 3-4  02/09/2014   .  Supratherapeutic INR  02/09/2014   .  Recurrent UTI  02/09/2014   .  Fractures  02/09/2014   .  Chronic constipation  02/08/2014   .  Hyperkalemia  02/07/2014   .  Fall  02/07/2014   .  Compression fracture  02/07/2014   .  Syncope  02/07/2014   .  Encounter for therapeutic drug monitoring  09/27/2013   .  Foot pain  09/27/2013   .  Rectal pain  09/27/2013   .  Hearing loss, right  09/27/2013   .  Left foot pain  05/12/2013   .  Left shoulder pain  03/22/2013   .  Abdominal  pain, other specified site  06/23/2012   .  Fever  03/24/2012   .  Lumbar back pain  03/10/2012   .  Gait disorder  03/10/2012   .  Fatigue  09/04/2011   .  Optic nerve hemorrhage  12/23/2010   .  Preventative health care  11/29/2010   .  Pulmonary embolism  10/14/2010   .  Other specified forms of hearing loss  05/30/2010   .  DIASTOLIC HEART FAILURE, CHRONIC  12/14/2009   .  POSTHERPETIC NEURALGIA  11/02/2009   .  ALLERGIC RHINITIS  11/02/2009   .  RENAL INSUFFICIENCY  07/02/2009   .  POSTURAL HYPOTENSION  02/07/2008   .  SYNCOPE  02/07/2008   .  DEPRESSIVE DISORDER   01/25/2008   .  CONSTIPATION  01/25/2008   .  DIABETES MELLITUS, TYPE II  09/06/2007   .  RENAL CYST, LEFT  09/06/2007   .  B12 DEFICIENCY  05/23/2007   .  HYPERLIPIDEMIA  05/23/2007   .  Atrial fibrillation  05/23/2007   .  OSTEOPOROSIS  05/23/2007   .  SHINGLES, HX OF  05/23/2007   .  ANEMIA-IRON DEFICIENCY  01/26/2007   .  HYPERTENSION  01/26/2007   .  GERD  01/26/2007     Expected Discharge Date: Expected Discharge Date: 02/16/14  Team Members Present: Physician leading conference: Dr. Alger Sheppard Social Worker Present: Lindsay Alpers, LCSW Nurse Present: Lindsay Cousin, RN PT Present: Lindsay Sheppard, PT OT Present: Lindsay Sheppard, OT;Lindsay Sheppard, OT;Lindsay Sheppard, OT SLP Present: Lindsay Sheppard, SLP Other (Discipline and Name): Lindsay Baxter, RN PPS Coordinator present : Lindsay Nakayama, RN, CRRN;Lindsay Sheppard, PT        Current Status/Progress  Goal  Weekly Team Focus   Medical     acute on chronic  lumbar fxs, sacral fx, gait disorder  improve activity tolerance  pain management, nutrition   Bowel/Bladder     Continent bowel and bladder  Remain continent  Monitor   Swallow/Nutrition/ Hydration            ADL's     overall supervision> occasional steady assist  overall supervision>min assist; may upgrade some goals  ADL retraining, functional mobility/transfers, overall activity toleranc/endurance, family education   Mobility     min A to supervison for transfers and gait, pt fatigues quickly "never gets to rest"  Min A to Mod I (close to reaching all LTGs)  strengthing/endurance, transfers   Communication            Safety/Cognition/ Behavioral Observations    No unsafe behaviors; anticipates needs and calls appropriately for assistance  No falls, injuries this admission.  Follow safety plan; pt. education   Pain     Occasional 5 mg Oxy IR.  Pain managed at goal 3/10  Monitor.   Skin     No issues; C.D.I..  Remain free from injury, infection.  Monitor,  assist with repositioning as needed.    Rehab Goals Patient on target to meet rehab goals: Yes Rehab Goals Revised: None *See Care Plan and progress notes for long and short-term goals.    Barriers to Discharge:  pain, activity tolerance,      Possible Resolutions to Barriers:    stamina training, family ed, pain mgt      Discharge Planning/Teaching Needs:    Pt to return to her home where her son lives and family members will take turns being with pt 24/7.   Dtrs have been present for therapies.    Team Discussion:    Pt was admitted for deconditioning and syncopal episodes.  Pt continues to have some pain, but team is working on managing the pain better and increasing activity tolerance.  She will be ready to go on 02-16-14.   Revisions to Treatment Plan:    None    Continued Need for Acute Rehabilitation Level of Care: The patient requires daily medical management by a physician with specialized training in physical medicine and rehabilitation for the following conditions: Daily direction of a multidisciplinary physical rehabilitation program to ensure safe treatment while eliciting the highest outcome that is of practical value to the patient.: Yes Daily medical management of patient stability for increased activity during participation in an intensive rehabilitation regime.: Yes Daily analysis of laboratory values and/or radiology reports with any subsequent need for medication adjustment of medical intervention for : Post surgical problems;Cardiac problems  Lindsay Sheppard, Lindsay Mesi 02/17/2014, 1:24 PM

## 2014-02-17 NOTE — Telephone Encounter (Signed)
Phone call from a PA at [facility] inpatient rehab [phone] stating patient INR was therapeutic upon discharge. PT 24.6 and INR 2.31. 3mg  coumadin daily. [facility] will check INR. Patient has an appointment to see you on June 25.   Received a call from [facility] with [place] [phone]. INR today is 2.0 on 2mg  of coumadin 1 1/2 daily

## 2014-02-20 NOTE — Telephone Encounter (Signed)
Lindsay Sheppard left msg on triage stating that they call her INR in on Friday haven't heard back from office. Also just wanted to let md know BP has been elevated running (R) 200/80 & (L) 198/84. Called Lindsay Sheppard back inform her md didn't change any coumadin orders. Pt has appt on this Thurs 02/23/14, and to address her BP advise her she will need to be seen. Lindsay Sheppard stated today after PT BP went down to 180/70...lmb

## 2014-02-23 ENCOUNTER — Other Ambulatory Visit: Payer: Self-pay | Admitting: Internal Medicine

## 2014-02-23 ENCOUNTER — Other Ambulatory Visit (INDEPENDENT_AMBULATORY_CARE_PROVIDER_SITE_OTHER): Payer: Medicare Other

## 2014-02-23 ENCOUNTER — Encounter: Payer: Self-pay | Admitting: Internal Medicine

## 2014-02-23 ENCOUNTER — Ambulatory Visit (INDEPENDENT_AMBULATORY_CARE_PROVIDER_SITE_OTHER): Payer: Medicare Other | Admitting: Internal Medicine

## 2014-02-23 VITALS — BP 112/70 | HR 85 | Temp 97.9°F | Wt 122.0 lb

## 2014-02-23 DIAGNOSIS — N259 Disorder resulting from impaired renal tubular function, unspecified: Secondary | ICD-10-CM

## 2014-02-23 DIAGNOSIS — E538 Deficiency of other specified B group vitamins: Secondary | ICD-10-CM

## 2014-02-23 DIAGNOSIS — E119 Type 2 diabetes mellitus without complications: Secondary | ICD-10-CM

## 2014-02-23 DIAGNOSIS — I5032 Chronic diastolic (congestive) heart failure: Secondary | ICD-10-CM

## 2014-02-23 DIAGNOSIS — Z7901 Long term (current) use of anticoagulants: Secondary | ICD-10-CM

## 2014-02-23 LAB — URINALYSIS, ROUTINE W REFLEX MICROSCOPIC
Bilirubin Urine: NEGATIVE
Ketones, ur: NEGATIVE
Leukocytes, UA: NEGATIVE
NITRITE: NEGATIVE
Specific Gravity, Urine: >=1.03 — AB (ref 1.000–1.030)
Total Protein, Urine: 30 — AB
URINE GLUCOSE: 250 — AB
UROBILINOGEN UA: 0.2 (ref 0.0–1.0)
pH: 5.5 (ref 5.0–8.0)

## 2014-02-23 LAB — BASIC METABOLIC PANEL
BUN: 26 mg/dL — AB (ref 6–23)
CHLORIDE: 106 meq/L (ref 96–112)
CO2: 27 meq/L (ref 19–32)
CREATININE: 1.1 mg/dL (ref 0.4–1.2)
Calcium: 9.8 mg/dL (ref 8.4–10.5)
GFR: 50.58 mL/min — ABNORMAL LOW (ref >60.00)
GLUCOSE: 232 mg/dL — AB (ref 70–99)
Potassium: 4.9 mEq/L (ref 3.5–5.1)
Sodium: 139 mEq/L (ref 135–145)

## 2014-02-23 LAB — HEPATIC FUNCTION PANEL
ALBUMIN: 3.8 g/dL (ref 3.5–5.2)
ALT: 27 U/L (ref 0–35)
AST: 18 U/L (ref 0–37)
Alkaline Phosphatase: 81 U/L (ref 39–117)
Bilirubin, Direct: 0.1 mg/dL (ref 0.0–0.3)
Total Bilirubin: 0.4 mg/dL (ref 0.2–1.2)
Total Protein: 6.1 g/dL (ref 6.0–8.3)

## 2014-02-23 LAB — CBC WITH DIFFERENTIAL/PLATELET
BASOS PCT: 0.1 % (ref 0.0–3.0)
Basophils Absolute: 0 10*3/uL (ref 0.0–0.1)
EOS PCT: 1.3 % (ref 0.0–5.0)
Eosinophils Absolute: 0.1 10*3/uL (ref 0.0–0.7)
HCT: 37.3 % (ref 36.0–46.0)
Hemoglobin: 12.4 g/dL (ref 12.0–15.0)
Lymphocytes Relative: 11.3 % — ABNORMAL LOW (ref 12.0–46.0)
Lymphs Abs: 0.7 10*3/uL (ref 0.7–4.0)
MCHC: 33.3 g/dL (ref 30.0–36.0)
MCV: 90.1 fl (ref 78.0–100.0)
Monocytes Absolute: 0.3 10*3/uL (ref 0.1–1.0)
Monocytes Relative: 5.9 % (ref 3.0–12.0)
Neutro Abs: 4.8 10*3/uL (ref 1.4–7.7)
Neutrophils Relative %: 81.4 % — ABNORMAL HIGH (ref 43.0–77.0)
Platelets: 227 10*3/uL (ref 150.0–400.0)
RBC: 4.14 Mil/uL (ref 3.87–5.11)
RDW: 17.1 % — ABNORMAL HIGH (ref 11.5–15.5)
WBC: 5.9 10*3/uL (ref 4.0–10.5)

## 2014-02-23 LAB — PROTIME-INR
INR: 2.6 ratio — ABNORMAL HIGH (ref 0.8–1.0)
Prothrombin Time: 28.1 s — ABNORMAL HIGH (ref 9.6–13.1)

## 2014-02-23 MED ORDER — GLUCOSE BLOOD VI STRP: ORAL_STRIP | Status: DC

## 2014-02-23 MED ORDER — CYANOCOBALAMIN 1000 MCG/ML IJ SOLN
1000.0000 ug | Freq: Once | INTRAMUSCULAR | Status: AC
  Administered 2014-02-23: 1000 ug via INTRAMUSCULAR

## 2014-02-23 NOTE — Telephone Encounter (Signed)
Lindsay Sheppard, somehow

## 2014-02-23 NOTE — Patient Instructions (Addendum)
OK to increase the lantus to 18 units per day  OK to increase the lantus by 3 units every 3 days if the average of your blood sugars is over 175  Please continue all other medications as before, and refills have been done if requested.  Please have the pharmacy call with any other refills you may need.  Please continue your efforts at being more active, low cholesterol diet, and weight control.  You are otherwise up to date with prevention measures today.  Please keep your appointments with your specialists as you may have planned  Please go to the LAB in the Basement (turn left off the elevator) for the tests to be done today  You will be contacted by phone if any changes need to be made immediately.  Otherwise, you will receive a letter about your results with an explanation, but please check with MyChart first.  Please return in 3 months, or sooner if needed

## 2014-02-23 NOTE — Progress Notes (Signed)
Subjective:    Patient ID: Lindsay Sheppard, female    DOB: 08/30/1921, 80 y.o.   MRN: [id number]  HPI  Here to fu after recent d/c June 19 from 2 wk rehab. Was slow to get up this am, but yest was walking with walker and overall has done ok with appearing stronger with ambulation.  Clonidine patch had been decr from .3 to .1 at d/c but actually taking the .3 since has been at home as family felt uncomfortable with change andBP elev at the .1 in rehab, today after 5 days back on the .3 mg =  Bp 135/80 his am, also sugar 164 this am off the metformin; family asks for labs including UA as some ? Mentally slower today; INR was 2.0 on fri June 19, now on coumadin 3 mg daily (up from 2.5 mg daily); no over bleeding or bruising.  Pt denies chest pain, increased sob or doe, wheezing, orthopnea, PND, increased LE swelling, palpitations, dizziness or syncope.   Pt denies polydipsia, polyuria, but sugars overall in the high 100's/200's.   Past Medical History  Diagnosis Date  . POSTHERPETIC NEURALGIA 11/02/2009  . B12 DEFICIENCY 05/23/2007  . HYPERLIPIDEMIA 05/23/2007  . HYPERKALEMIA 05/30/2010  . ANEMIA-IRON DEFICIENCY 01/26/2007  . BLEPHARITIS, BILATERAL 02/23/2008  . Other specified forms of hearing loss 05/30/2010  . HYPERTENSION 01/26/2007  . Atrial fibrillation 05/23/2007  . DIASTOLIC HEART FAILURE, CHRONIC 12/14/2009  . POSTURAL HYPOTENSION 02/07/2008  . ALLERGIC RHINITIS 11/02/2009  . GERD 01/26/2007  . CONSTIPATION 01/25/2008  . SKIN LESION 05/30/2010  . BACK PAIN 08/02/2007  . OSTEOPOROSIS 05/23/2007  . SYNCOPE 02/07/2008  . FATIGUE 09/06/2007  . Dysuria 09/05/2008  . Abdominal pain, epigastric 09/06/2007  . EPIGASTRIC TENDERNESS 10/12/2007  . PULMONARY EMBOLISM, HX OF 05/23/2007  . SHINGLES, HX OF 05/23/2007  . 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 Left foot pain   . Diabetic neuropathy   . RENAL INSUFFICIENCY 07/02/2009  . RENAL CYST, LEFT 09/06/2007  . Kidney stones     "never had OR"  . Family history of  anesthesia complication     "daughter has PONV; another daughter a little anesthesia lasts way too long"  . CHF (congestive heart failure) 2011  . Myocardial infarction 2011    "mild"  . DVT (deep venous thrombosis) ~ 1964    "think it was in the left"  . Aspiration pneumonia ~ 2006    "due to aspiration"  . DIABETES MELLITUS, TYPE II 09/06/2007  . Arthritis     "hands" (02/07/2014)  . DEPRESSIVE DISORDER 01/25/2008    "@ times; never treated for it"  . Melanoma of nose   . Compression fracture of lumbar vertebra     hx  . Compression fracture of thoracic vertebra 02/07/2014    "fell this am" (02/07/2014   Past Surgical History  Procedure Laterality Date  . Appendectomy  1943  . Abdominal hysterectomy  1964  . Cardiac electrophysiology mapping and ablation  1999  . Cataract extraction w/ intraocular lens  implant, bilateral  2003-2005  . Carpal tunnel release Right 2004  . Mohs surgery  2011    "removed off her nose"  . Cardiac catheterization  10/2009  . Esophageal dilation  X 2    reports that she has never smoked. She has never used smokeless tobacco. She reports that she does not drink alcohol or use illicit drugs. family history includes Breast cancer in her mother; Colon cancer in her father; Diabetes  in her sister; Hypertension in her other; Stroke in her father. Allergies  Allergen Reactions  . Amiodarone     REACTION: f? fluid retention  . Deltasone [Prednisone]   . Diazepam     REACTION: agitation  . Diltiazem Hcl   . Levofloxacin     REACTION: nausea  . Pregabalin     REACTION: hallucinations  . Spironolactone     REACTION: elevated K at lowest dose  . Tequin [Gatifloxacin]    Current Outpatient Prescriptions on File Prior to Visit  Medication Sig Dispense Refill  . acetaminophen (TYLENOL) 325 MG tablet Take 2 tablets (650 mg total) by mouth 3 (three) times daily.      . Cyanocobalamin (VITAMIN B-12 IJ) Inject as directed every 30 (thirty) days.      . ferrous  sulfate 325 (65 FE) MG tablet Take 1 tablet (325 mg total) by mouth 2 (two) times daily with a meal.  60 tablet  1  . fexofenadine (ALLEGRA) 180 MG tablet Take 180 mg by mouth every evening.       . fluticasone (FLONASE) 50 MCG/ACT nasal spray Place 2 sprays into the nose daily.  16 g  2  . Hypromellose (ARTIFICIAL TEARS OP) Place 1 drop into both eyes daily.      . insulin glargine (LANTUS) 100 UNIT/ML injection Inject 0.15 mLs (15 Units total) into the skin daily. Inject 7 units subcutaneously once a day  40 mL  1  . labetalol (NORMODYNE) 200 MG tablet Take two pills in morning and two pills at bedtime daily      . pantoprazole (PROTONIX) 40 MG tablet Take 1 tablet (40 mg total) by mouth daily.  90 tablet  3  . polyethylene glycol (MIRALAX / GLYCOLAX) packet Take 17 g by mouth 2 (two) times daily.  14 each  0  . pravastatin (PRAVACHOL) 40 MG tablet Take 0.5 tablets (20 mg total) by mouth daily. 1/2 daily  45 tablet  3  . warfarin (COUMADIN) 2 MG tablet Take 1 1/2 pill daily with supper  60 tablet  1   No current facility-administered medications on file prior to visit.   Review of Systems  Constitutional: Negative for unusual diaphoresis or other sweats  HENT: Negative for ringing in ear Eyes: Negative for double vision or worsening visual disturbance.  Respiratory: Negative for choking and stridor.   Gastrointestinal: Negative for vomiting or other signifcant bowel change Genitourinary: Negative for hematuria or decreased urine volume.  Musculoskeletal: Negative for other MSK pain or swelling Skin: Negative for color change and worsening wound.  Neurological: Negative for tremors and numbness other than noted  Psychiatric/Behavioral: Negative for decreased concentration or agitation other than above       Objective:   Physical Exam BP 112/70  Pulse 85  Temp(Src) 97.9 F (36.6 C) (Oral)  Wt 122 lb (55.339 kg)  SpO2 95% VS noted,  Constitutional: Pt appears well-developed,  well-nourished.  HENT: Head: NCAT.  Right Ear: External ear normal.  Left Ear: External ear normal.  Eyes: . Pupils are equal, round, and reactive to light. Conjunctivae and EOM are normal Neck: Normal range of motion. Neck supple.  Cardiovascular: Normal rate and regular rhythm.   Pulmonary/Chest: Effort normal and breath sounds normal.  Abd:  Soft, NT, ND, + BS Neurological: Pt is alert. Not confused , motor grossly intact, severe HOH Skin: Skin is warm. No rash, No LE edema Psychiatric: Pt behavior is normal. No agitation.  Assessment & Plan:

## 2014-02-23 NOTE — Progress Notes (Signed)
Pre visit review using our clinic review tool, if applicable. No additional management support is needed unless otherwise documented below in the visit note. 

## 2014-02-26 NOTE — Assessment & Plan Note (Signed)
For b12 IM today as she is due,  to f/u any worsening symptoms or concerns

## 2014-02-26 NOTE — Assessment & Plan Note (Signed)
Ok to increase the lantus to 18 units, with instructions to home titrate upward for ave cbg > 175 every 3 days but 3 more units,  to f/u any worsening symptoms or concerns, call if any concerns

## 2014-02-26 NOTE — Assessment & Plan Note (Signed)
stable overall by history and exam, recent data reviewed with pt, and pt to continue medical treatment as before,  to f/u any worsening symptoms or concerns Lab Results  Component Value Date   WBC 5.9 02/23/2014   HGB 12.4 02/23/2014   HCT 37.3 02/23/2014   PLT 227.0 02/23/2014   GLUCOSE 232* 02/23/2014   CHOL 132 09/27/2013   TRIG 87.0 09/27/2013   HDL 43.50 09/27/2013   LDLCALC 71 09/27/2013   ALT 27 02/23/2014   AST 18 02/23/2014   NA 139 02/23/2014   K 4.9 02/23/2014   CL 106 02/23/2014   CREATININE 1.1 02/23/2014   BUN 26* 02/23/2014   CO2 27 02/23/2014   TSH 3.180 02/07/2014   INR 2.6* 02/23/2014   HGBA1C 6.5* 02/07/2014   MICROALBUR 2.5* 06/03/2011

## 2014-02-26 NOTE — Assessment & Plan Note (Signed)
stable overall by history and exam, and pt to continue medical treatment as before,  to f/u any worsening symptoms or concerns 

## 2014-02-27 ENCOUNTER — Ambulatory Visit (INDEPENDENT_AMBULATORY_CARE_PROVIDER_SITE_OTHER): Payer: Medicare Other | Admitting: General Practice

## 2014-02-27 DIAGNOSIS — Z5181 Encounter for therapeutic drug level monitoring: Secondary | ICD-10-CM

## 2014-02-27 LAB — POCT INR
INR: 2.6
INR: 2.9

## 2014-02-27 NOTE — Progress Notes (Signed)
Pre visit review using our clinic review tool, if applicable. No additional management support is needed unless otherwise documented below in the visit note. 

## 2014-03-05 DIAGNOSIS — R404 Transient alteration of awareness: Secondary | ICD-10-CM

## 2014-03-05 DIAGNOSIS — Z5181 Encounter for therapeutic drug level monitoring: Secondary | ICD-10-CM

## 2014-03-05 DIAGNOSIS — Z7901 Long term (current) use of anticoagulants: Secondary | ICD-10-CM

## 2014-03-05 DIAGNOSIS — I5032 Chronic diastolic (congestive) heart failure: Secondary | ICD-10-CM

## 2014-03-05 DIAGNOSIS — I509 Heart failure, unspecified: Secondary | ICD-10-CM

## 2014-03-05 DIAGNOSIS — E119 Type 2 diabetes mellitus without complications: Secondary | ICD-10-CM

## 2014-03-05 DIAGNOSIS — I4891 Unspecified atrial fibrillation: Secondary | ICD-10-CM

## 2014-03-05 DIAGNOSIS — Z8673 Personal history of transient ischemic attack (TIA), and cerebral infarction without residual deficits: Secondary | ICD-10-CM

## 2014-03-05 DIAGNOSIS — Z9181 History of falling: Secondary | ICD-10-CM

## 2014-03-05 DIAGNOSIS — N184 Chronic kidney disease, stage 4 (severe): Secondary | ICD-10-CM

## 2014-03-05 DIAGNOSIS — I129 Hypertensive chronic kidney disease with stage 1 through stage 4 chronic kidney disease, or unspecified chronic kidney disease: Secondary | ICD-10-CM

## 2014-03-06 ENCOUNTER — Telehealth: Payer: Self-pay | Admitting: *Deleted

## 2014-03-06 ENCOUNTER — Ambulatory Visit (INDEPENDENT_AMBULATORY_CARE_PROVIDER_SITE_OTHER): Payer: Medicare Other | Admitting: General Practice

## 2014-03-06 DIAGNOSIS — Z5181 Encounter for therapeutic drug level monitoring: Secondary | ICD-10-CM

## 2014-03-06 LAB — POCT INR: INR: 1.8

## 2014-03-06 NOTE — Progress Notes (Signed)
Pre visit review using our clinic review tool, if applicable. No additional management support is needed unless otherwise documented below in the visit note. 

## 2014-03-06 NOTE — Telephone Encounter (Signed)
Left msg on triage requesting order to d/c the tel monitoring equipment. Pt is unable to get up and weigh...Lindsay Sheppard

## 2014-03-06 NOTE — Telephone Encounter (Signed)
Ok for this request?

## 2014-03-07 NOTE — Telephone Encounter (Signed)
Notified Tabetha with md response...Lindsay Sheppard

## 2014-03-13 ENCOUNTER — Ambulatory Visit (INDEPENDENT_AMBULATORY_CARE_PROVIDER_SITE_OTHER): Payer: Medicare Other | Admitting: Family Medicine

## 2014-03-13 DIAGNOSIS — Z5181 Encounter for therapeutic drug level monitoring: Secondary | ICD-10-CM

## 2014-03-13 LAB — POCT INR: INR: 2.4

## 2014-03-15 ENCOUNTER — Encounter (HOSPITAL_COMMUNITY): Payer: Self-pay | Admitting: Emergency Medicine

## 2014-03-15 ENCOUNTER — Inpatient Hospital Stay (HOSPITAL_COMMUNITY): Payer: Medicare Other

## 2014-03-15 ENCOUNTER — Emergency Department (HOSPITAL_COMMUNITY): Payer: Medicare Other

## 2014-03-15 ENCOUNTER — Inpatient Hospital Stay (HOSPITAL_COMMUNITY)
Admission: EM | Admit: 2014-03-15 | Discharge: 2014-03-17 | DRG: 069 | Disposition: A | Discharge status: Home / Self Care | Payer: Medicare Other | Attending: Family Medicine | Admitting: Family Medicine

## 2014-03-15 DIAGNOSIS — F329 Major depressive disorder, single episode, unspecified: Secondary | ICD-10-CM

## 2014-03-15 DIAGNOSIS — E538 Deficiency of other specified B group vitamins: Secondary | ICD-10-CM

## 2014-03-15 DIAGNOSIS — N390 Urinary tract infection, site not specified: Secondary | ICD-10-CM

## 2014-03-15 DIAGNOSIS — B0229 Other postherpetic nervous system involvement: Secondary | ICD-10-CM

## 2014-03-15 DIAGNOSIS — Z888 Allergy status to other drugs, medicaments and biological substances status: Secondary | ICD-10-CM

## 2014-03-15 DIAGNOSIS — Z7901 Long term (current) use of anticoagulants: Secondary | ICD-10-CM | POA: Diagnosis not present

## 2014-03-15 DIAGNOSIS — IMO0002 Reserved for concepts with insufficient information to code with codable children: Secondary | ICD-10-CM

## 2014-03-15 DIAGNOSIS — E785 Hyperlipidemia, unspecified: Secondary | ICD-10-CM

## 2014-03-15 DIAGNOSIS — I10 Essential (primary) hypertension: Secondary | ICD-10-CM

## 2014-03-15 DIAGNOSIS — H9191 Unspecified hearing loss, right ear: Secondary | ICD-10-CM

## 2014-03-15 DIAGNOSIS — Z0000 Encounter for general adult medical examination without abnormal findings: Secondary | ICD-10-CM

## 2014-03-15 DIAGNOSIS — M810 Age-related osteoporosis without current pathological fracture: Secondary | ICD-10-CM | POA: Diagnosis present

## 2014-03-15 DIAGNOSIS — Z823 Family history of stroke: Secondary | ICD-10-CM

## 2014-03-15 DIAGNOSIS — K6289 Other specified diseases of anus and rectum: Secondary | ICD-10-CM

## 2014-03-15 DIAGNOSIS — R4789 Other speech disturbances: Secondary | ICD-10-CM | POA: Diagnosis present

## 2014-03-15 DIAGNOSIS — J309 Allergic rhinitis, unspecified: Secondary | ICD-10-CM

## 2014-03-15 DIAGNOSIS — T07XXXA Unspecified multiple injuries, initial encounter: Secondary | ICD-10-CM

## 2014-03-15 DIAGNOSIS — G459 Transient cerebral ischemic attack, unspecified: Principal | ICD-10-CM | POA: Diagnosis present

## 2014-03-15 DIAGNOSIS — Z800 Family history of malignant neoplasm of digestive organs: Secondary | ICD-10-CM | POA: Diagnosis not present

## 2014-03-15 DIAGNOSIS — R791 Abnormal coagulation profile: Secondary | ICD-10-CM

## 2014-03-15 DIAGNOSIS — K219 Gastro-esophageal reflux disease without esophagitis: Secondary | ICD-10-CM | POA: Diagnosis present

## 2014-03-15 DIAGNOSIS — K5909 Other constipation: Secondary | ICD-10-CM

## 2014-03-15 DIAGNOSIS — I2699 Other pulmonary embolism without acute cor pulmonale: Secondary | ICD-10-CM

## 2014-03-15 DIAGNOSIS — N289 Disorder of kidney and ureter, unspecified: Secondary | ICD-10-CM

## 2014-03-15 DIAGNOSIS — Z833 Family history of diabetes mellitus: Secondary | ICD-10-CM

## 2014-03-15 DIAGNOSIS — N281 Cyst of kidney, acquired: Secondary | ICD-10-CM

## 2014-03-15 DIAGNOSIS — M79672 Pain in left foot: Secondary | ICD-10-CM

## 2014-03-15 DIAGNOSIS — D6832 Hemorrhagic disorder due to extrinsic circulating anticoagulants: Secondary | ICD-10-CM

## 2014-03-15 DIAGNOSIS — D509 Iron deficiency anemia, unspecified: Secondary | ICD-10-CM

## 2014-03-15 DIAGNOSIS — I252 Old myocardial infarction: Secondary | ICD-10-CM | POA: Diagnosis not present

## 2014-03-15 DIAGNOSIS — I4891 Unspecified atrial fibrillation: Secondary | ICD-10-CM

## 2014-03-15 DIAGNOSIS — T45515A Adverse effect of anticoagulants, initial encounter: Secondary | ICD-10-CM | POA: Diagnosis present

## 2014-03-15 DIAGNOSIS — I951 Orthostatic hypotension: Secondary | ICD-10-CM

## 2014-03-15 DIAGNOSIS — E43 Unspecified severe protein-calorie malnutrition: Secondary | ICD-10-CM

## 2014-03-15 DIAGNOSIS — T148XXA Other injury of unspecified body region, initial encounter: Secondary | ICD-10-CM

## 2014-03-15 DIAGNOSIS — R5381 Other malaise: Secondary | ICD-10-CM | POA: Diagnosis not present

## 2014-03-15 DIAGNOSIS — R2990 Unspecified symptoms and signs involving the nervous system: Secondary | ICD-10-CM

## 2014-03-15 DIAGNOSIS — I6350 Cerebral infarction due to unspecified occlusion or stenosis of unspecified cerebral artery: Secondary | ICD-10-CM

## 2014-03-15 DIAGNOSIS — Z803 Family history of malignant neoplasm of breast: Secondary | ICD-10-CM | POA: Diagnosis not present

## 2014-03-15 DIAGNOSIS — I5032 Chronic diastolic (congestive) heart failure: Secondary | ICD-10-CM

## 2014-03-15 DIAGNOSIS — E119 Type 2 diabetes mellitus without complications: Secondary | ICD-10-CM

## 2014-03-15 DIAGNOSIS — Z87898 Personal history of other specified conditions: Secondary | ICD-10-CM

## 2014-03-15 DIAGNOSIS — M25512 Pain in left shoulder: Secondary | ICD-10-CM

## 2014-03-15 DIAGNOSIS — Z86711 Personal history of pulmonary embolism: Secondary | ICD-10-CM | POA: Diagnosis not present

## 2014-03-15 DIAGNOSIS — R269 Unspecified abnormalities of gait and mobility: Secondary | ICD-10-CM

## 2014-03-15 DIAGNOSIS — H918X9 Other specified hearing loss, unspecified ear: Secondary | ICD-10-CM

## 2014-03-15 DIAGNOSIS — R55 Syncope and collapse: Secondary | ICD-10-CM

## 2014-03-15 DIAGNOSIS — E1149 Type 2 diabetes mellitus with other diabetic neurological complication: Secondary | ICD-10-CM | POA: Diagnosis present

## 2014-03-15 DIAGNOSIS — H9190 Unspecified hearing loss, unspecified ear: Secondary | ICD-10-CM | POA: Diagnosis present

## 2014-03-15 DIAGNOSIS — Z85820 Personal history of malignant melanoma of skin: Secondary | ICD-10-CM

## 2014-03-15 DIAGNOSIS — Z5181 Encounter for therapeutic drug level monitoring: Secondary | ICD-10-CM

## 2014-03-15 DIAGNOSIS — R5383 Other fatigue: Secondary | ICD-10-CM | POA: Diagnosis present

## 2014-03-15 DIAGNOSIS — N259 Disorder resulting from impaired renal tubular function, unspecified: Secondary | ICD-10-CM

## 2014-03-15 DIAGNOSIS — F3289 Other specified depressive episodes: Secondary | ICD-10-CM

## 2014-03-15 DIAGNOSIS — K5900 Constipation, unspecified: Secondary | ICD-10-CM

## 2014-03-15 DIAGNOSIS — R109 Unspecified abdominal pain: Secondary | ICD-10-CM

## 2014-03-15 DIAGNOSIS — I639 Cerebral infarction, unspecified: Secondary | ICD-10-CM

## 2014-03-15 LAB — URINE MICROSCOPIC-ADD ON

## 2014-03-15 LAB — DIFFERENTIAL
Basophils Absolute: 0 10*3/uL (ref 0.0–0.1)
Basophils Relative: 0 % (ref 0–1)
Eosinophils Absolute: 0.1 10*3/uL (ref 0.0–0.7)
Eosinophils Relative: 1 % (ref 0–5)
LYMPHS PCT: 16 % (ref 12–46)
Lymphs Abs: 0.9 10*3/uL (ref 0.7–4.0)
Monocytes Absolute: 0.5 10*3/uL (ref 0.1–1.0)
Monocytes Relative: 9 % (ref 3–12)
NEUTROS ABS: 4.1 10*3/uL (ref 1.7–7.7)
Neutrophils Relative %: 74 % (ref 43–77)

## 2014-03-15 LAB — GLUCOSE, CAPILLARY: GLUCOSE-CAPILLARY: 97 mg/dL (ref 70–99)

## 2014-03-15 LAB — I-STAT CHEM 8, ED
BUN: 33 mg/dL — ABNORMAL HIGH (ref 6–23)
CHLORIDE: 105 meq/L (ref 96–112)
Calcium, Ion: 1.27 mmol/L (ref 1.13–1.30)
Creatinine, Ser: 1.2 mg/dL — ABNORMAL HIGH (ref 0.50–1.10)
Glucose, Bld: 185 mg/dL — ABNORMAL HIGH (ref 70–99)
HEMATOCRIT: 42 % (ref 36.0–46.0)
Hemoglobin: 14.3 g/dL (ref 12.0–15.0)
Potassium: 4.6 mEq/L (ref 3.7–5.3)
Sodium: 137 mEq/L (ref 137–147)
TCO2: 25 mmol/L (ref 0–100)

## 2014-03-15 LAB — URINALYSIS, ROUTINE W REFLEX MICROSCOPIC
Bilirubin Urine: NEGATIVE
Glucose, UA: NEGATIVE mg/dL
Ketones, ur: NEGATIVE mg/dL
NITRITE: NEGATIVE
Protein, ur: 30 mg/dL — AB
SPECIFIC GRAVITY, URINE: 1.012 (ref 1.005–1.030)
UROBILINOGEN UA: 0.2 mg/dL (ref 0.0–1.0)
pH: 6.5 (ref 5.0–8.0)

## 2014-03-15 LAB — CBC
HEMATOCRIT: 41.1 % (ref 36.0–46.0)
Hemoglobin: 13.2 g/dL (ref 12.0–15.0)
MCH: 29.5 pg (ref 26.0–34.0)
MCHC: 32.1 g/dL (ref 30.0–36.0)
MCV: 91.9 fL (ref 78.0–100.0)
PLATELETS: 148 10*3/uL — AB (ref 150–400)
RBC: 4.47 MIL/uL (ref 3.87–5.11)
RDW: 14.4 % (ref 11.5–15.5)
WBC: 5.6 10*3/uL (ref 4.0–10.5)

## 2014-03-15 LAB — COMPREHENSIVE METABOLIC PANEL
ALT: 20 U/L (ref 0–35)
AST: 24 U/L (ref 0–37)
Albumin: 3.6 g/dL (ref 3.5–5.2)
Alkaline Phosphatase: 73 U/L (ref 39–117)
Anion gap: 13 (ref 5–15)
BILIRUBIN TOTAL: 0.3 mg/dL (ref 0.3–1.2)
BUN: 31 mg/dL — ABNORMAL HIGH (ref 6–23)
CHLORIDE: 100 meq/L (ref 96–112)
CO2: 25 meq/L (ref 19–32)
Calcium: 9.8 mg/dL (ref 8.4–10.5)
Creatinine, Ser: 1.19 mg/dL — ABNORMAL HIGH (ref 0.50–1.10)
GFR calc Af Amer: 45 mL/min — ABNORMAL LOW (ref >90)
GFR, EST NON AFRICAN AMERICAN: 39 mL/min — AB (ref >90)
Glucose, Bld: 186 mg/dL — ABNORMAL HIGH (ref 70–99)
Potassium: 4.8 mEq/L (ref 3.7–5.3)
SODIUM: 138 meq/L (ref 137–147)
Total Protein: 6.2 g/dL (ref 6.0–8.3)

## 2014-03-15 LAB — APTT: APTT: 32 s (ref 24–37)

## 2014-03-15 LAB — PROTIME-INR
INR: 2.69 — ABNORMAL HIGH (ref 0.00–1.49)
Prothrombin Time: 28.6 seconds — ABNORMAL HIGH (ref 11.6–15.2)

## 2014-03-15 LAB — CBG MONITORING, ED: Glucose-Capillary: 179 mg/dL — ABNORMAL HIGH (ref 70–99)

## 2014-03-15 LAB — I-STAT TROPONIN, ED: Troponin i, poc: 0.02 ng/mL (ref 0.00–0.08)

## 2014-03-15 MED ORDER — ACETAMINOPHEN 325 MG PO TABS
650.0000 mg | ORAL_TABLET | Freq: Three times a day (TID) | ORAL | Status: DC
  Administered 2014-03-16 – 2014-03-17 (×5): 650 mg via ORAL
  Filled 2014-03-15 (×5): qty 2

## 2014-03-15 MED ORDER — WARFARIN - PHARMACIST DOSING INPATIENT: Freq: Every day | Status: DC

## 2014-03-15 MED ORDER — LABETALOL HCL 5 MG/ML IV SOLN
10.0000 mg | INTRAVENOUS | Status: DC | PRN
  Administered 2014-03-15: 10 mg via INTRAVENOUS
  Filled 2014-03-15: qty 4

## 2014-03-15 MED ORDER — INSULIN ASPART 100 UNIT/ML ~~LOC~~ SOLN
0.0000 [IU] | Freq: Three times a day (TID) | SUBCUTANEOUS | Status: DC
  Administered 2014-03-16 (×2): 3 [IU] via SUBCUTANEOUS
  Administered 2014-03-17: 2 [IU] via SUBCUTANEOUS
  Administered 2014-03-17: 5 [IU] via SUBCUTANEOUS
  Administered 2014-03-17: 3 [IU] via SUBCUTANEOUS

## 2014-03-15 MED ORDER — CLONIDINE HCL 0.2 MG/24HR TD PTWK
0.2000 mg | MEDICATED_PATCH | TRANSDERMAL | Status: DC
  Filled 2014-03-15: qty 1

## 2014-03-15 MED ORDER — DEXTROSE-NACL 5-0.45 % IV SOLN
INTRAVENOUS | Status: DC
  Administered 2014-03-15: 19:00:00 via INTRAVENOUS

## 2014-03-15 MED ORDER — LABETALOL HCL 5 MG/ML IV SOLN
5.0000 mg | Freq: Once | INTRAVENOUS | Status: AC
  Administered 2014-03-15: 5 mg via INTRAVENOUS
  Filled 2014-03-15: qty 4

## 2014-03-15 MED ORDER — STROKE: EARLY STAGES OF RECOVERY BOOK
Freq: Once | Status: DC
  Filled 2014-03-15: qty 1

## 2014-03-15 MED ORDER — WARFARIN SODIUM 3 MG PO TABS
3.0000 mg | ORAL_TABLET | Freq: Once | ORAL | Status: DC
  Filled 2014-03-15: qty 1

## 2014-03-15 MED ORDER — PANTOPRAZOLE SODIUM 40 MG PO TBEC
40.0000 mg | DELAYED_RELEASE_TABLET | Freq: Every day | ORAL | Status: DC
  Administered 2014-03-16 – 2014-03-17 (×2): 40 mg via ORAL
  Filled 2014-03-15 (×2): qty 1

## 2014-03-15 MED ORDER — HYDRALAZINE HCL 25 MG PO TABS
25.0000 mg | ORAL_TABLET | Freq: Four times a day (QID) | ORAL | Status: DC | PRN
Start: 2014-03-15 — End: 2014-03-17
  Filled 2014-03-15 (×2): qty 1

## 2014-03-15 MED ORDER — LABETALOL HCL 200 MG PO TABS
400.0000 mg | ORAL_TABLET | Freq: Two times a day (BID) | ORAL | Status: DC
  Administered 2014-03-16 – 2014-03-17 (×3): 400 mg via ORAL
  Filled 2014-03-15 (×5): qty 2

## 2014-03-15 MED ORDER — POLYETHYLENE GLYCOL 3350 17 G PO PACK
17.0000 g | PACK | Freq: Two times a day (BID) | ORAL | Status: DC
  Administered 2014-03-16 – 2014-03-17 (×3): 17 g via ORAL
  Filled 2014-03-15 (×5): qty 1

## 2014-03-15 MED ORDER — SIMVASTATIN 10 MG PO TABS
10.0000 mg | ORAL_TABLET | Freq: Every day | ORAL | Status: DC
  Administered 2014-03-16 – 2014-03-17 (×2): 10 mg via ORAL
  Filled 2014-03-15 (×3): qty 1

## 2014-03-15 MED ORDER — INSULIN GLARGINE 100 UNIT/ML ~~LOC~~ SOLN
5.0000 [IU] | Freq: Every day | SUBCUTANEOUS | Status: DC
  Administered 2014-03-17: 5 [IU] via SUBCUTANEOUS
  Filled 2014-03-15 (×3): qty 0.05

## 2014-03-15 MED ORDER — LABETALOL HCL 5 MG/ML IV SOLN
10.0000 mg | INTRAVENOUS | Status: DC | PRN
  Administered 2014-03-16 (×2): 10 mg via INTRAVENOUS
  Filled 2014-03-15 (×2): qty 4

## 2014-03-15 MED ORDER — INSULIN ASPART 100 UNIT/ML ~~LOC~~ SOLN: 0.0000 [IU] | Freq: Three times a day (TID) | SUBCUTANEOUS | Status: DC

## 2014-03-15 MED ORDER — LABETALOL HCL 5 MG/ML IV SOLN
10.0000 mg | INTRAVENOUS | Status: DC | PRN
  Administered 2014-03-15 (×2): 10 mg via INTRAVENOUS
  Filled 2014-03-15 (×2): qty 4

## 2014-03-15 NOTE — ED Provider Notes (Signed)
CSN: [id number]     Arrival date & time 03/15/14  1307 History   First MD Initiated Contact with Patient 03/15/14 1335     Chief Complaint  Patient presents with  . Code Stroke   Pt cleared by Dr Ashok Cordia to go to CT and was seen on arrival  (Consider location/radiation/quality/duration/timing/severity/associated sxs/prior Treatment) HPI patient brought in by EMS as a code stroke. Per EMS family states patient was out in the yard around noon and they noted she was weak. Patient states she was sitting on the porch and when she walked into the house she felt weak. She denies headache or chest pain. She denies any pain. Per EMS her family noted slurred speech and thought she had new left-sided weakness. This had resolved by the time EMS arrived. EMS reports about 1254 they noted she was having slurred speech again. Pt was seen by the stroke team on arrival.    PCP Dr Jenny Reichmann  Past Medical History  Diagnosis Date  . POSTHERPETIC NEURALGIA 11/02/2009  . B12 DEFICIENCY 05/23/2007  . HYPERLIPIDEMIA 05/23/2007  . HYPERKALEMIA 05/30/2010  . ANEMIA-IRON DEFICIENCY 01/26/2007  . BLEPHARITIS, BILATERAL 02/23/2008  . Other specified forms of hearing loss 05/30/2010  . HYPERTENSION 01/26/2007  . Atrial fibrillation 05/23/2007  . DIASTOLIC HEART FAILURE, CHRONIC 12/14/2009  . POSTURAL HYPOTENSION 02/07/2008  . ALLERGIC RHINITIS 11/02/2009  . GERD 01/26/2007  . CONSTIPATION 01/25/2008  . SKIN LESION 05/30/2010  . BACK PAIN 08/02/2007  . OSTEOPOROSIS 05/23/2007  . SYNCOPE 02/07/2008  . FATIGUE 09/06/2007  . Dysuria 09/05/2008  . Abdominal pain, epigastric 09/06/2007  . EPIGASTRIC TENDERNESS 10/12/2007  . PULMONARY EMBOLISM, HX OF 05/23/2007  . SHINGLES, HX OF 05/23/2007  . 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 Left foot pain   . Diabetic neuropathy   . RENAL INSUFFICIENCY 07/02/2009  . RENAL CYST, LEFT 09/06/2007  . Kidney stones     "never had OR"  . Family history of anesthesia complication     "daughter has PONV; another  daughter a little anesthesia lasts way too long"  . CHF (congestive heart failure) 2011  . Myocardial infarction 2011    "mild"  . DVT (deep venous thrombosis) ~ 1964    "think it was in the left"  . Aspiration pneumonia ~ 2006    "due to aspiration"  . DIABETES MELLITUS, TYPE II 09/06/2007  . Arthritis     "hands" (02/07/2014)  . DEPRESSIVE DISORDER 01/25/2008    "@ times; never treated for it"  . Melanoma of nose   . Compression fracture of lumbar vertebra     hx  . Compression fracture of thoracic vertebra 02/07/2014    "fell this am" (02/07/2014   Past Surgical History  Procedure Laterality Date  . Appendectomy  1943  . Abdominal hysterectomy  1964  . Cardiac electrophysiology mapping and ablation  1999  . Cataract extraction w/ intraocular lens  implant, bilateral  2003-2005  . Carpal tunnel release Right 2004  . Mohs surgery  2011    "removed off her nose"  . Cardiac catheterization  10/2009  . Esophageal dilation  X 2   Family History  Problem Relation Age of Onset  . Breast cancer Mother   . Colon cancer Father   . Stroke Father     died with stroke postop  . Diabetes Sister     2 sisters  . Hypertension Other    History  Substance Use Topics  . Smoking status: Never Smoker   .  Smokeless tobacco: Never Used  . Alcohol Use: No   Lives at home  OB History   Grav Para Term Preterm Abortions TAB SAB Ect Mult Living                 Review of Systems  All other systems reviewed and are negative.     Allergies  Amiodarone; Deltasone; Diazepam; Diltiazem hcl; Levofloxacin; Pregabalin; Spironolactone; and Tequin  Home Medications   Prior to Admission medications   Medication Sig Start Date End Date Taking? Authorizing Provider  acetaminophen (TYLENOL) 325 MG tablet Take 2 tablets (650 mg total) by mouth 3 (three) times daily. 02/09/14   Delfina Redwood, MD  CRANBERRY PO Take by mouth daily.    Historical Provider, MD  Cyanocobalamin (VITAMIN B-12 IJ) Inject  as directed every 30 (thirty) days.    Historical Provider, MD  ferrous sulfate 325 (65 FE) MG tablet Take 1 tablet (325 mg total) by mouth 2 (two) times daily with a meal. 02/15/14   Ivan Anchors Love, PA-C  fexofenadine (ALLEGRA) 180 MG tablet Take 180 mg by mouth every evening.     Historical Provider, MD  fluticasone (FLONASE) 50 MCG/ACT nasal spray Place 2 sprays into the nose daily. 03/22/13   Biagio Borg, MD  glucose blood (ONE TOUCH ULTRA TEST) test strip Use as instructed before meals and bedtime (qid) 250.02  Due to fluctuating blood sugars 02/23/14   Biagio Borg, MD  Hypromellose (ARTIFICIAL TEARS OP) Place 1 drop into both eyes daily.    Historical Provider, MD  insulin glargine (LANTUS) 100 UNIT/ML injection Inject 0.15 mLs (15 Units total) into the skin daily. Inject 7 units subcutaneously once a day 02/15/14   Ivan Anchors Love, PA-C  labetalol (NORMODYNE) 200 MG tablet Take two pills in morning and two pills at bedtime daily 02/16/14   Ivan Anchors Love, PA-C  pantoprazole (PROTONIX) 40 MG tablet Take 1 tablet (40 mg total) by mouth daily. 07/20/13   Biagio Borg, MD  polyethylene glycol [facility] / Floria Raveling) packet Take 17 g by mouth 2 (two) times daily. 02/15/14   Bary Leriche, PA-C  pravastatin (PRAVACHOL) 40 MG tablet Take 0.5 tablets (20 mg total) by mouth daily. 1/2 daily 07/20/13   Biagio Borg, MD  warfarin (COUMADIN) 2 MG tablet Take 1 1/2 pill daily with supper 02/16/14   Ivan Anchors Love, PA-C   BP 204/100  Pulse 94  Temp(Src) 97.5 F (36.4 C) (Oral)  Resp 16  Wt 122 lb 5.7 oz (55.5 kg)  SpO2 99%  Vital signs normal except for hypertension  Physical Exam  Nursing note and vitals reviewed. Constitutional: She is oriented to person, place, and time.  Non-toxic appearance. She does not appear ill. No distress.  Elderly frail female  HENT:  Head: Normocephalic and atraumatic.  Right Ear: External ear normal.  Left Ear: External ear normal.  Nose: Nose normal. No mucosal edema or  rhinorrhea.  Mouth/Throat: Oropharynx is clear and moist and mucous membranes are normal. No dental abscesses or uvula swelling.  Eyes: Conjunctivae and EOM are normal. Pupils are equal, round, and reactive to light.  Neck: Normal range of motion and full passive range of motion without pain. Neck supple.  Cardiovascular: Normal rate and normal heart sounds.  An irregular rhythm present. Exam reveals no gallop and no friction rub.   No murmur heard. Pulmonary/Chest: Effort normal and breath sounds normal. No respiratory distress. She has no wheezes. She has  no rhonchi. She has no rales. She exhibits no tenderness and no crepitus.  Abdominal: Soft. Normal appearance and bowel sounds are normal. She exhibits no distension. There is no tenderness. There is no rebound and no guarding.  Musculoskeletal: Normal range of motion. She exhibits no edema and no tenderness.  Neurological: She is alert and oriented to person, place, and time.  Neuro exam deferred to Dr. Aram Beecham, neurologist  Skin: Skin is warm, dry and intact. No rash noted. No erythema. There is pallor.  Psychiatric: She has a normal mood and affect. Her speech is normal and behavior is normal. Her mood appears not anxious.    ED Course  Procedures (including critical care time)  Medications  labetalol (NORMODYNE,TRANDATE) injection 5 mg (5 mg Intravenous Given 03/15/14 1335)   Dr Armida Sans, neurology and stroke team were already at the end of their neuro exam when I entered the room. The radiologist called the CT scan report to me as no acute changes at 13:27. They report her NIH scale was 4 with left pronator drift, decreased sensation of the RUE and left upper visual field loss. Her INR makes her not a candidate for tPA. They have asked to have medicine admit. They also wanted her to get labetalol 5 mg IV.  14:02 Dr Darrick Meigs, admit to tele, neuro, team 10  Labs Review Results for orders placed during the hospital encounter of 03/15/14   PROTIME-INR      Result Value Ref Range   Prothrombin Time 28.6 (*) 11.6 - 15.2 seconds   INR 2.69 (*) 0.00 - 1.49  APTT      Result Value Ref Range   aPTT 32  24 - 37 seconds  CBC      Result Value Ref Range   WBC 5.6  4.0 - 10.5 K/uL   RBC 4.47  3.87 - 5.11 MIL/uL   Hemoglobin 13.2  12.0 - 15.0 g/dL   HCT 41.1  36.0 - 46.0 %   MCV 91.9  78.0 - 100.0 fL   MCH 29.5  26.0 - 34.0 pg   MCHC 32.1  30.0 - 36.0 g/dL   RDW 14.4  11.5 - 15.5 %   Platelets 148 (*) 150 - 400 K/uL  DIFFERENTIAL      Result Value Ref Range   Neutrophils Relative % 74  43 - 77 %   Neutro Abs 4.1  1.7 - 7.7 K/uL   Lymphocytes Relative 16  12 - 46 %   Lymphs Abs 0.9  0.7 - 4.0 K/uL   Monocytes Relative 9  3 - 12 %   Monocytes Absolute 0.5  0.1 - 1.0 K/uL   Eosinophils Relative 1  0 - 5 %   Eosinophils Absolute 0.1  0.0 - 0.7 K/uL   Basophils Relative 0  0 - 1 %   Basophils Absolute 0.0  0.0 - 0.1 K/uL  COMPREHENSIVE METABOLIC PANEL      Result Value Ref Range   Sodium 138  137 - 147 mEq/L   Potassium 4.8  3.7 - 5.3 mEq/L   Chloride 100  96 - 112 mEq/L   CO2 25  19 - 32 mEq/L   Glucose, Bld 186 (*) 70 - 99 mg/dL   BUN 31 (*) 6 - 23 mg/dL   Creatinine, Ser 1.19 (*) 0.50 - 1.10 mg/dL   Calcium 9.8  8.4 - 10.5 mg/dL   Total Protein 6.2  6.0 - 8.3 g/dL   Albumin 3.6  3.5 -  5.2 g/dL   AST 24  0 - 37 U/L   ALT 20  0 - 35 U/L   Alkaline Phosphatase 73  39 - 117 U/L   Total Bilirubin 0.3  0.3 - 1.2 mg/dL   GFR calc non Af Amer 39 (*) >90 mL/min   GFR calc Af Amer 45 (*) >90 mL/min   Anion gap 13  5 - 15  CBG MONITORING, ED      Result Value Ref Range   Glucose-Capillary 179 (*) 70 - 99 mg/dL  I-STAT CHEM 8, ED      Result Value Ref Range   Sodium 137  137 - 147 mEq/L   Potassium 4.6  3.7 - 5.3 mEq/L   Chloride 105  96 - 112 mEq/L   BUN 33 (*) 6 - 23 mg/dL   Creatinine, Ser 1.20 (*) 0.50 - 1.10 mg/dL   Glucose, Bld 185 (*) 70 - 99 mg/dL   Calcium, Ion 1.27  1.13 - 1.30 mmol/L   TCO2 25  0 - 100  mmol/L   Hemoglobin 14.3  12.0 - 15.0 g/dL   HCT 42.0  36.0 - 46.0 %  I-STAT TROPOININ, ED      Result Value Ref Range   Troponin i, poc 0.02  0.00 - 0.08 ng/mL   Comment 3             Laboratory interpretation all normal except Renal insufficiency, hyperglycemia    Imaging Review Ct Head (brain) Wo Contrast  03/15/2014   CLINICAL DATA:  Code stroke.  EXAM: CT HEAD WITHOUT CONTRAST  TECHNIQUE: Contiguous axial images were obtained from the base of the skull through the vertex without intravenous contrast.  COMPARISON:  Head CT 02/07/2014.  FINDINGS: No mass. No hydrocephalus. No hemorrhage. The visualized orbits are unremarkable. Visualized paranasal sinuses and mastoids are clear.  IMPRESSION: No acute abnormality. These results were called by telephone at the time of interpretation on 03/15/2014 at 1:24 pm to Dr.Jamirah Zelaya, who verbally acknowledged these results.   Electronically Signed   By: Marcello Moores  Register   On: 03/15/2014 13:27     EKG Interpretation   Date/Time:  Wednesday March 15 2014 13:27:52 EDT Ventricular Rate:  97 PR Interval:  171 QRS Duration: 105 QT Interval:  350 QTC Calculation: 445 R Axis:   -76 Text Interpretation:  Sinus tachycardia Multiple ventricular premature  complexes Consider left atrial enlargement Left anterior fascicular block  LVH with secondary repolarization abnormality Since last tracing 07 Feb 2014  Right bundle branch block is no longer Present Confirmed by Chivas Notz   MD-I, Shaquela Weichert ([id number]) on 03/15/2014 1:45:58 PM      MDM   Final diagnoses:  Stroke  Renal insufficiency  Essential hypertension  Warfarin-induced coagulopathy     Plan admission   Rolland Porter, MD, Alanson Aly, MD 03/15/14 1419

## 2014-03-15 NOTE — Progress Notes (Signed)
[facility]  Patient Status: Active (receiving services up to time of hospitalization)  [facility] is providing the following services: RN, PT and OT  If patient discharges after hours, please call [phone].   Pearletha Forge 03/15/2014, 5:25 PM

## 2014-03-15 NOTE — Code Documentation (Signed)
80yo female arriving to [facility] via [facility] at 1307.  EMS reports they were called for slow speech and left sided weakness.  Family reported she was drooling from her mouth.  On arrival the patient had cleared with no symptoms present.  En route at 1254 the EMT assessed slurred speech and Code Stroke was called.  Patient with a h/o stroke and is currently taking Coumadin.  Patient lives alone but has a caretaker during the day.  Labs drawn on arrival.  Patient taken to CT which was read by Dr. Armida Sans.  Patient back to room.  Initial NIHSS 4 for left arm droop, slurred speech, left upper field cut and right face decreased sensation.  See documentation for stroke times.  INR 2.69 on admission.  No acute stroke intervention at this time per Dr. Armida Sans.  Patient contraindicated for tPA d/t taking Coumadin with an INR of 2.69.  Bedside handoff with ED RN Andee Poles.

## 2014-03-15 NOTE — Consult Note (Addendum)
Referring Physician: Tomi Bamberger    Chief Complaint: stroke  HPI:                                                                                                                                         Lindsay Sheppard is an 80 y.o. female who was seen at 12:15 by family and noted to be diffusely weak.  EMS was called --on arrival patient symptoms had resolved.  En route EMS felt her symptoms had returned with slurred speech. Currently she shows a mild left arm drift, left upper quadrant hemianopsia and decreased sensation on the right lower face.  Patient is on chronic coumadin and INR returned 2.69.  Due to minimal symptoms and INR patient was not a tPA candidate. BP was elevated and ED is currently addressing elevated BP.   Date last known well: Date: 03/15/2014 Time last known well: Time: 12:54 NIHSS: 4 tPA Given: No: minimal symptoms and elevated INR  Past Medical History  Diagnosis Date  . POSTHERPETIC NEURALGIA 11/02/2009  . B12 DEFICIENCY 05/23/2007  . HYPERLIPIDEMIA 05/23/2007  . HYPERKALEMIA 05/30/2010  . ANEMIA-IRON DEFICIENCY 01/26/2007  . BLEPHARITIS, BILATERAL 02/23/2008  . Other specified forms of hearing loss 05/30/2010  . HYPERTENSION 01/26/2007  . Atrial fibrillation 05/23/2007  . DIASTOLIC HEART FAILURE, CHRONIC 12/14/2009  . POSTURAL HYPOTENSION 02/07/2008  . ALLERGIC RHINITIS 11/02/2009  . GERD 01/26/2007  . CONSTIPATION 01/25/2008  . SKIN LESION 05/30/2010  . BACK PAIN 08/02/2007  . OSTEOPOROSIS 05/23/2007  . SYNCOPE 02/07/2008  . FATIGUE 09/06/2007  . Dysuria 09/05/2008  . Abdominal pain, epigastric 09/06/2007  . EPIGASTRIC TENDERNESS 10/12/2007  . PULMONARY EMBOLISM, HX OF 05/23/2007  . SHINGLES, HX OF 05/23/2007  . 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 Left foot pain   . Diabetic neuropathy   . RENAL INSUFFICIENCY 07/02/2009  . RENAL CYST, LEFT 09/06/2007  . Kidney stones     "never had OR"  . Family history of anesthesia complication     "daughter has PONV; another daughter a little  anesthesia lasts way too long"  . CHF (congestive heart failure) 2011  . Myocardial infarction 2011    "mild"  . DVT (deep venous thrombosis) ~ 1964    "think it was in the left"  . Aspiration pneumonia ~ 2006    "due to aspiration"  . DIABETES MELLITUS, TYPE II 09/06/2007  . Arthritis     "hands" (02/07/2014)  . DEPRESSIVE DISORDER 01/25/2008    "@ times; never treated for it"  . Melanoma of nose   . Compression fracture of lumbar vertebra     hx  . Compression fracture of thoracic vertebra 02/07/2014    "fell this am" (02/07/2014    Past Surgical History  Procedure Laterality Date  . Appendectomy  1943  . Abdominal hysterectomy  1964  . Cardiac electrophysiology mapping and ablation  1999  . Cataract extraction  w/ intraocular lens  implant, bilateral  2003-2005  . Carpal tunnel release Right 2004  . Mohs surgery  2011    "removed off her nose"  . Cardiac catheterization  10/2009  . Esophageal dilation  X 2    Family History  Problem Relation Age of Onset  . Breast cancer Mother   . Colon cancer Father   . Stroke Father     died with stroke postop  . Diabetes Sister     2 sisters  . Hypertension Other    Social History:  reports that she has never smoked. She has never used smokeless tobacco. She reports that she does not drink alcohol or use illicit drugs.  Allergies:  Allergies  Allergen Reactions  . Amiodarone     REACTION: f? fluid retention  . Deltasone [Prednisone]   . Diazepam     REACTION: agitation  . Diltiazem Hcl   . Levofloxacin     REACTION: nausea  . Pregabalin     REACTION: hallucinations  . Spironolactone     REACTION: elevated K at lowest dose  . Tequin [Gatifloxacin]     Medications:                                                                                                                           No current facility-administered medications for this encounter.   Current Outpatient Prescriptions  Medication Sig Dispense Refill  .  acetaminophen (TYLENOL) 325 MG tablet Take 2 tablets (650 mg total) by mouth 3 (three) times daily.      Marland Kitchen CRANBERRY PO Take by mouth daily.      . Cyanocobalamin (VITAMIN B-12 IJ) Inject as directed every 30 (thirty) days.      . ferrous sulfate 325 (65 FE) MG tablet Take 1 tablet (325 mg total) by mouth 2 (two) times daily with a meal.  60 tablet  1  . fexofenadine (ALLEGRA) 180 MG tablet Take 180 mg by mouth every evening.       . fluticasone (FLONASE) 50 MCG/ACT nasal spray Place 2 sprays into the nose daily.  16 g  2  . glucose blood (ONE TOUCH ULTRA TEST) test strip Use as instructed before meals and bedtime (qid) 250.02  Due to fluctuating blood sugars  400 each  12  . Hypromellose (ARTIFICIAL TEARS OP) Place 1 drop into both eyes daily.      . insulin glargine (LANTUS) 100 UNIT/ML injection Inject 0.15 mLs (15 Units total) into the skin daily. Inject 7 units subcutaneously once a day  40 mL  1  . labetalol (NORMODYNE) 200 MG tablet Take two pills in morning and two pills at bedtime daily      . pantoprazole (PROTONIX) 40 MG tablet Take 1 tablet (40 mg total) by mouth daily.  90 tablet  3  . polyethylene glycol (MIRALAX / GLYCOLAX) packet Take 17 g by mouth 2 (two) times daily.  14 each  0  .  pravastatin (PRAVACHOL) 40 MG tablet Take 0.5 tablets (20 mg total) by mouth daily. 1/2 daily  45 tablet  3  . warfarin (COUMADIN) 2 MG tablet Take 1 1/2 pill daily with supper  60 tablet  1     ROS:                                                                                                                                       History obtained from the patient  General ROS: negative for - chills, fatigue, fever, night sweats, weight gain or weight loss Psychological ROS: negative for - behavioral disorder, hallucinations, memory difficulties, mood swings or suicidal ideation Ophthalmic ROS: negative for - blurry vision, double vision, eye pain or loss of vision ENT ROS: negative for -  epistaxis, nasal discharge, oral lesions, sore throat, tinnitus or vertigo Allergy and Immunology ROS: negative for - hives or itchy/watery eyes Hematological and Lymphatic ROS: negative for - bleeding problems, bruising or swollen lymph nodes Endocrine ROS: negative for - galactorrhea, hair pattern changes, polydipsia/polyuria or temperature intolerance Respiratory ROS: negative for - cough, hemoptysis, shortness of breath or wheezing Cardiovascular ROS: negative for - chest pain, dyspnea on exertion, edema or irregular heartbeat Gastrointestinal ROS: negative for - abdominal pain, diarrhea, hematemesis, nausea/vomiting or stool incontinence Genito-Urinary ROS: negative for - dysuria, hematuria, incontinence or urinary frequency/urgency Musculoskeletal ROS: negative for - joint swelling or muscular weakness Neurological ROS: as noted in HPI Dermatological ROS: negative for rash and skin lesion changes  Neurologic Examination:                                                                                                      Blood pressure 204/100, pulse 94, temperature 97.5 F (36.4 C), temperature source Oral, resp. rate 16, weight 55.5 kg (122 lb 5.7 oz), SpO2 99.00%.   General: NAD Mental Status: Alert, oriented, thought content appropriate.  Speech fluent without evidence of aphasia.  Able to follow 3 step commands without difficulty. Cranial Nerves: II: Discs flat bilaterally; Visual fields shows a left upper quadrant field cut, pupils equal, round, reactive to light and accommodation III,IV, VI: ptosis not present, extra-ocular motions intact bilaterally V,VII: smile symmetric, facial light touch sensation decreased on the lower right face VIII: hearing normal bilaterally IX,X: gag reflex present XI: bilateral shoulder shrug XII: midline tongue extension without atrophy or fasciculations  Motor: Right : Upper extremity   5/5    Left:     Upper extremity  5/5  Lower extremity    5/5     Lower extremity   5/5 --left upper extremity drift Tone and bulk:normal tone throughout; no atrophy noted Sensory: Pinprick and light touch intact throughout, bilaterally Deep Tendon Reflexes:  Right: Upper Extremity   Left: Upper extremity   biceps (C-5 to C-6) 2/4   biceps (C-5 to C-6) 2/4 tricep (C7) 2/4    triceps (C7) 2/4 Brachioradialis (C6) 2/4  Brachioradialis (C6) 2/4  Lower Extremity Lower Extremity  quadriceps (L-2 to L-4) 1/4   quadriceps (L-2 to L-4) 1/4 Achilles (S1) 0/4   Achilles (S1) 0/4  Plantars: Mute bilaterally Cerebellar: normal finger-to-nose,  normal heel-to-shin test Gait: not tested CV: pulses palpable throughout    Lab Results: Basic Metabolic Panel:  Recent Labs Lab 03/15/14 1316  NA 137  K 4.6  CL 105  GLUCOSE 185*  BUN 33*  CREATININE 1.20*    Liver Function Tests: No results found for this basename: AST, ALT, ALKPHOS, BILITOT, PROT, ALBUMIN,  in the last 168 hours No results found for this basename: LIPASE, AMYLASE,  in the last 168 hours No results found for this basename: AMMONIA,  in the last 168 hours  CBC:  Recent Labs Lab 03/15/14 1307 03/15/14 1316  WBC 5.6  --   NEUTROABS 4.1  --   HGB 13.2 14.3  HCT 41.1 42.0  MCV 91.9  --   PLT 148*  --     Cardiac Enzymes: No results found for this basename: CKTOTAL, CKMB, CKMBINDEX, TROPONINI,  in the last 168 hours  Lipid Panel: No results found for this basename: CHOL, TRIG, HDL, CHOLHDL, VLDL, LDLCALC,  in the last 168 hours  CBG:  Recent Labs Lab 03/15/14 1334  GLUCAP 179*    Microbiology: Results for orders placed during the hospital encounter of 02/07/14  URINE CULTURE     Status: None   Collection Time    02/07/14  8:45 AM      Result Value Ref Range Status   Specimen Description URINE, CATHETERIZED   Final   Special Requests Normal   Final   Culture  Setup Time     Final   Value: 02/07/2014 11:15     Performed at [facility]     Final   Value: NO GROWTH     Performed at [facility]   Culture     Final   Value: NO GROWTH     Performed at [facility]   Report Status 02/08/2014 FINAL   Final    Coagulation Studies:  Recent Labs  03/13/14 1345 03/15/14 1307  LABPROT  --  28.6*  INR 2.4 2.69*    Imaging: Ct Head (brain) Wo Contrast  03/15/2014   CLINICAL DATA:  Code stroke.  EXAM: CT HEAD WITHOUT CONTRAST  TECHNIQUE: Contiguous axial images were obtained from the base of the skull through the vertex without intravenous contrast.  COMPARISON:  Head CT 02/07/2014.  FINDINGS: No mass. No hydrocephalus. No hemorrhage. The visualized orbits are unremarkable. Visualized paranasal sinuses and mastoids are clear.  IMPRESSION: No acute abnormality. These results were called by telephone at the time of interpretation on 03/15/2014 at 1:24 pm to Dr.Knapp, who verbally acknowledged these results.   Electronically Signed   By: Marcello Moores  Register   On: 03/15/2014 13:27       Assessment and plan discussed with with attending physician and they are in agreement.    Shanon Brow  [facility] [phone]  03/15/2014, 1:48 PM   Assessment: 80 y.o. female with Afib on chronic coumadin.  Patient presented to ED with slurred speech. On exam speech was clear but does show a left upper quadrant field cut and left UE drift.  Although not a tPA candidate due to elevated INR, cannot fully rule out CVA.   Stroke Risk Factors - atrial fibrillation, diabetes mellitus and hypertension  Recommend: 1) MRI brain to evaluate for CVA. If negative would not further stroke work up. If positive recommend Echo, FLP, A1c 2) BP control to keep BP <210/110  Patient seen and examined together with physician assistant and I concur with the assessment and plan.  Dorian Pod, MD

## 2014-03-15 NOTE — H&P (Signed)
PCP:   [name], MD   Chief Complaint:  Slurred speech  HPI:  80 year old female with history of hypertension, atrial fibrillation on chronic anticoagulation with Coumadin, history of pulmonary embolism, iron deficiency*anemia, GERD, chronic diastolic heart failure, diabetes mellitus, chronic back pain, compression fracture of thoracic and lumbar vertebra was brought to the ED after patient had an episode of slurred speech at home. As per patient's daughter who lives with her, patient was out in the yard around noon time, after that patient went for a nap. When patient woke up daughter noticed that patient had some slurred speech and also drooling from right side of the face when she tried to give her milk via straw. At that time and the daughter checked agents blood pressure it was elevated to 409 systolic. In the ED the patient's blood pressure has been systolic greater than 811. Patient uses weekly Catapres patch at home along with twice a day labetalol.  EMS was called and by the time her symptoms had improved. Patient was brought to the ED, she was not found to be TPA candidate as she has been on Coumadin for anticoagulation with therapeutic INR of 2.69. CT head was done in the ED which was negative for stroke  As per the family patient did not have seizure, no weakness of extremities, no blurred vision, no nausea vomiting or diarrhea. No chest pain shortness of breath.  Allergies:   Allergies  Allergen Reactions  . Amiodarone     REACTION: f? fluid retention  . Deltasone [Prednisone]   . Diazepam     REACTION: agitation  . Diltiazem Hcl   . Levofloxacin     REACTION: nausea  . Pregabalin     REACTION: hallucinations  . Spironolactone     REACTION: elevated K at lowest dose  . Tequin [Gatifloxacin]       Past Medical History  Diagnosis Date  . POSTHERPETIC NEURALGIA 11/02/2009  . B12 DEFICIENCY 05/23/2007  . HYPERLIPIDEMIA 05/23/2007  . HYPERKALEMIA 05/30/2010  .  ANEMIA-IRON DEFICIENCY 01/26/2007  . BLEPHARITIS, BILATERAL 02/23/2008  . Other specified forms of hearing loss 05/30/2010  . HYPERTENSION 01/26/2007  . Atrial fibrillation 05/23/2007  . DIASTOLIC HEART FAILURE, CHRONIC 12/14/2009  . POSTURAL HYPOTENSION 02/07/2008  . ALLERGIC RHINITIS 11/02/2009  . GERD 01/26/2007  . CONSTIPATION 01/25/2008  . SKIN LESION 05/30/2010  . BACK PAIN 08/02/2007  . OSTEOPOROSIS 05/23/2007  . SYNCOPE 02/07/2008  . FATIGUE 09/06/2007  . Dysuria 09/05/2008  . Abdominal pain, epigastric 09/06/2007  . EPIGASTRIC TENDERNESS 10/12/2007  . PULMONARY EMBOLISM, HX OF 05/23/2007  . SHINGLES, HX OF 05/23/2007  . 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 Left foot pain   . Diabetic neuropathy   . RENAL INSUFFICIENCY 07/02/2009  . RENAL CYST, LEFT 09/06/2007  . Kidney stones     "never had OR"  . Family history of anesthesia complication     "daughter has PONV; another daughter a little anesthesia lasts way too long"  . CHF (congestive heart failure) 2011  . Myocardial infarction 2011    "mild"  . DVT (deep venous thrombosis) ~ 1964    "think it was in the left"  . Aspiration pneumonia ~ 2006    "due to aspiration"  . DIABETES MELLITUS, TYPE II 09/06/2007  . Arthritis     "hands" (02/07/2014)  . DEPRESSIVE DISORDER 01/25/2008    "@ times; never treated for it"  . Melanoma of nose   . Compression fracture of lumbar vertebra  hx  . Compression fracture of thoracic vertebra 02/07/2014    "fell this am" (02/07/2014    Past Surgical History  Procedure Laterality Date  . Appendectomy  1943  . Abdominal hysterectomy  1964  . Cardiac electrophysiology mapping and ablation  1999  . Cataract extraction w/ intraocular lens  implant, bilateral  2003-2005  . Carpal tunnel release Right 2004  . Mohs surgery  2011    "removed off her nose"  . Cardiac catheterization  10/2009  . Esophageal dilation  X 2    Prior to Admission medications   Medication Sig Start Date End Date Taking? Authorizing  Provider  acetaminophen (TYLENOL) 325 MG tablet Take 2 tablets (650 mg total) by mouth 3 (three) times daily. 02/09/14   Delfina Redwood, MD  CRANBERRY PO Take by mouth daily.    Historical Provider, MD  Cyanocobalamin (VITAMIN B-12 IJ) Inject as directed every 30 (thirty) days.    Historical Provider, MD  ferrous sulfate 325 (65 FE) MG tablet Take 1 tablet (325 mg total) by mouth 2 (two) times daily with a meal. 02/15/14   Ivan Anchors Love, PA-C  fexofenadine (ALLEGRA) 180 MG tablet Take 180 mg by mouth every evening.     Historical Provider, MD  fluticasone (FLONASE) 50 MCG/ACT nasal spray Place 2 sprays into the nose daily. 03/22/13   Biagio Borg, MD  glucose blood (ONE TOUCH ULTRA TEST) test strip Use as instructed before meals and bedtime (qid) 250.02  Due to fluctuating blood sugars 02/23/14   Biagio Borg, MD  Hypromellose (ARTIFICIAL TEARS OP) Place 1 drop into both eyes daily.    Historical Provider, MD  insulin glargine (LANTUS) 100 UNIT/ML injection Inject 0.15 mLs (15 Units total) into the skin daily. Inject 7 units subcutaneously once a day 02/15/14   Ivan Anchors Love, PA-C  labetalol (NORMODYNE) 200 MG tablet Take two pills in morning and two pills at bedtime daily 02/16/14   Ivan Anchors Love, PA-C  pantoprazole (PROTONIX) 40 MG tablet Take 1 tablet (40 mg total) by mouth daily. 07/20/13   Biagio Borg, MD  polyethylene glycol [facility] Floria Raveling) packet Take 17 g by mouth 2 (two) times daily. 02/15/14   Bary Leriche, PA-C  pravastatin (PRAVACHOL) 40 MG tablet Take 0.5 tablets (20 mg total) by mouth daily. 1/2 daily 07/20/13   Biagio Borg, MD  warfarin (COUMADIN) 2 MG tablet Take 1 1/2 pill daily with supper 02/16/14   Bary Leriche, PA-C    Social History:  reports that she has never smoked. She has never used smokeless tobacco. She reports that she does not drink alcohol or use illicit drugs.  Family History  Problem Relation Age of Onset  . Breast cancer Mother   . Colon cancer Father    . Stroke Father     died with stroke postop  . Diabetes Sister     2 sisters  . Hypertension Other      All the positives are listed in BOLD  Review of Systems:  HEENT: Headache, blurred vision, runny nose, sore throat Neck: Hypothyroidism, hyperthyroidism,,lymphadenopathy Chest : Shortness of breath, history of COPD, Asthma Heart : Chest pain, history of coronary arterey disease GI:  Nausea, vomiting, diarrhea, constipation, GERD GU: Dysuria, urgency, frequency of urination, hematuria Neuro: Stroke, seizures, syncope Psych: Depression, anxiety, hallucinations   Physical Exam: Blood pressure 204/85, pulse 87, temperature 97.5 F (36.4 C), temperature source Oral, resp. rate 18, weight 55.5 kg (122  lb 5.7 oz), SpO2 98.00%. Constitutional:   Patient is a well-developed and well-nourished female* in no acute distress and cooperative with exam. Head: Normocephalic and atraumatic Mouth: Mucus membranes moist Eyes: PERRL, EOMI, conjunctivae normal Neck: Supple, No Thyromegaly Cardiovascular: RRR, S1 normal, S2 normal Pulmonary/Chest: CTAB, no wheezes, rales, or rhonchi Abdominal: Soft. Non-tender, non-distended, bowel sounds are normal, no masses, organomegaly, or guarding present.  Neurological: A&O x3, Strenght is normal and symmetric bilaterally, cranial nerve II-XII are grossly intact, no focal motor deficit, sensory intact to light touch bilaterally.  Extremities : No Cyanosis, Clubbing or Edema  Labs on Admission:  Basic Metabolic Panel:  Recent Labs Lab 03/15/14 1307 03/15/14 1316  NA 138 137  K 4.8 4.6  CL 100 105  CO2 25  --   GLUCOSE 186* 185*  BUN 31* 33*  CREATININE 1.19* 1.20*  CALCIUM 9.8  --    Liver Function Tests:  Recent Labs Lab 03/15/14 1307  AST 24  ALT 20  ALKPHOS 73  BILITOT 0.3  PROT 6.2  ALBUMIN 3.6   No results found for this basename: LIPASE, AMYLASE,  in the last 168 hours No results found for this basename: AMMONIA,  in the last  168 hours CBC:  Recent Labs Lab 03/15/14 1307 03/15/14 1316  WBC 5.6  --   NEUTROABS 4.1  --   HGB 13.2 14.3  HCT 41.1 42.0  MCV 91.9  --   PLT 148*  --    Cardiac Enzymes: No results found for this basename: CKTOTAL, CKMB, CKMBINDEX, TROPONINI,  in the last 168 hours  BNP (last 3 results) No results found for this basename: PROBNP,  in the last 8760 hours CBG:  Recent Labs Lab 03/15/14 1334  GLUCAP 179*    Radiological Exams on Admission: Ct Head (brain) Wo Contrast  03/15/2014   CLINICAL DATA:  Code stroke.  EXAM: CT HEAD WITHOUT CONTRAST  TECHNIQUE: Contiguous axial images were obtained from the base of the skull through the vertex without intravenous contrast.  COMPARISON:  Head CT 02/07/2014.  FINDINGS: No mass. No hydrocephalus. No hemorrhage. The visualized orbits are unremarkable. Visualized paranasal sinuses and mastoids are clear.  IMPRESSION: No acute abnormality. These results were called by telephone at the time of interpretation on 03/15/2014 at 1:24 pm to Dr.Knapp, who verbally acknowledged these results.   Electronically Signed   By: Marcello Moores  Register   On: 03/15/2014 13:27    EKG: Independently reviewed. Sinus tachycardia   Assessment/Plan Active Problems:   Stroke   CVA (cerebral infarction)  ? CVA Neurology has seen the patient, and recommend MRI brain. If MRI brain shows stroke then the stroke workup otherwise not. Will continue the Coumadin per pharmacy consultation Neurochecks every 2 hours Check hemoglobin A1c and fasting lipid profile  Diabetes mellitus Continue Lantus, will start Lantus 5 units daily along with sliding scale insulin with NovoLog.  Hypertension Patient takes Catapres patch 0.2 mg weekly along with labetalol 400 mg twice a day. As per daughter patient's blood pressure has been fluctuating at home. Some days it is too low and other days it is high. At this time we are holding the medications 4 pressure for permissive hypertension.  We'll only give hydralazine 25 mg every 6 hours when necessary for BP greater than 210/110. If MRI negative for stroke would recommend discontinuing Catapres before discharge.  Atrial fibrillation Patient currently in normal sinus rhythm, rate controlled. We'll continue with Coumadin per pharmacy consultation.  Code status: Patient is  full code  Family discussion: Admission, patients condition and plan of care including tests being ordered have been discussed with the patient and *her daughters at bedside who indicate understanding and agree with the plan and Code Status.   Time Spent on Admission: 60 minutes  [facility] Pager: [phone] 03/15/2014, 2:48 PM  If 7PM-7AM, please contact night-coverage  [email or web address]  Password [email or web address]

## 2014-03-15 NOTE — ED Notes (Signed)
Per EMS: Pt from home with family, family reports pt was in the yard when family noticed that pt became weak, family brought the pt in the house and noticed left sided weakness and slurred speech. EMS notes that upon arrival symptoms had resolved but en route at 1254 pt began to have slurred speech again. EMS noted unremarkable 12 lead, pt axo upon arrival nad noted. Neurology at bedside.

## 2014-03-15 NOTE — Consult Note (Signed)
ANTICOAGULATION CONSULT NOTE - Initial Consult  Pharmacy Consult for Coumadin Indication: atrial fibrillation, hx PE  Allergies  Allergen Reactions  . Amiodarone     REACTION: f? fluid retention  . Deltasone [Prednisone] Other (See Comments)    unknown  . Diazepam     REACTION: agitation  . Diltiazem Hcl Other (See Comments)    unknown  . Levofloxacin     REACTION: nausea  . Pregabalin     REACTION: hallucinations  . Spironolactone     REACTION: elevated K at lowest dose  . Tequin [Gatifloxacin] Other (See Comments)    Hypoglycemia    Patient Measurements: Weight: 122 lb 5.7 oz (55.5 kg)  Vital Signs: Temp: 97.9 F (36.6 C) (07/15 1512) Temp src: Oral (07/15 1341) BP: 200/91 mmHg (07/15 1512) Pulse Rate: 73 (07/15 1512)  Labs:  Recent Labs  03/13/14 1345 03/15/14 1307 03/15/14 1316  HGB  --  13.2 14.3  HCT  --  41.1 42.0  PLT  --  148*  --   APTT  --  32  --   LABPROT  --  28.6*  --   INR 2.4 2.69*  --   CREATININE  --  1.19* 1.20*    The CrCl is unknown because both a height and weight (above a minimum accepted value) are required for this calculation.   Medical History: Past Medical History  Diagnosis Date  . POSTHERPETIC NEURALGIA 11/02/2009  . B12 DEFICIENCY 05/23/2007  . HYPERLIPIDEMIA 05/23/2007  . HYPERKALEMIA 05/30/2010  . ANEMIA-IRON DEFICIENCY 01/26/2007  . BLEPHARITIS, BILATERAL 02/23/2008  . Other specified forms of hearing loss 05/30/2010  . HYPERTENSION 01/26/2007  . Atrial fibrillation 05/23/2007  . DIASTOLIC HEART FAILURE, CHRONIC 12/14/2009  . POSTURAL HYPOTENSION 02/07/2008  . ALLERGIC RHINITIS 11/02/2009  . GERD 01/26/2007  . CONSTIPATION 01/25/2008  . SKIN LESION 05/30/2010  . BACK PAIN 08/02/2007  . OSTEOPOROSIS 05/23/2007  . SYNCOPE 02/07/2008  . FATIGUE 09/06/2007  . Dysuria 09/05/2008  . Abdominal pain, epigastric 09/06/2007  . EPIGASTRIC TENDERNESS 10/12/2007  . PULMONARY EMBOLISM, HX OF 05/23/2007  . SHINGLES, HX OF 05/23/2007  . 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 Left foot pain   . Diabetic neuropathy   . RENAL INSUFFICIENCY 07/02/2009  . RENAL CYST, LEFT 09/06/2007  . Kidney stones     "never had OR"  . Family history of anesthesia complication     "daughter has PONV; another daughter a little anesthesia lasts way too long"  . CHF (congestive heart failure) 2011  . Myocardial infarction 2011    "mild"  . DVT (deep venous thrombosis) ~ 1964    "think it was in the left"  . Aspiration pneumonia ~ 2006    "due to aspiration"  . DIABETES MELLITUS, TYPE II 09/06/2007  . Arthritis     "hands" (02/07/2014)  . DEPRESSIVE DISORDER 01/25/2008    "@ times; never treated for it"  . Melanoma of nose   . Compression fracture of lumbar vertebra     hx  . Compression fracture of thoracic vertebra 02/07/2014    "fell this am" (02/07/2014   Assessment: 89yof on coumadin pta for hx afib and PE, being admitted for CVA workup. INR on admission is therapeutic at 2.69. Coumadin to continue.  Home dose: 3mg  daily except none on Sunday - last dose taken 7/14  Goal of Therapy:  INR 2-3 Monitor platelets by anticoagulation protocol: Yes   Plan:  1) Coumadin 3mg  x 1 tonight 2) Daily  INR  Deboraha Sprang 03/15/2014,4:01 PM

## 2014-03-15 NOTE — Progress Notes (Signed)
MRI brain did not show stroke, will restart the home medications for hypertension. She takes catapres patch and labetalol.

## 2014-03-16 DIAGNOSIS — E43 Unspecified severe protein-calorie malnutrition: Secondary | ICD-10-CM | POA: Insufficient documentation

## 2014-03-16 LAB — PROTIME-INR
INR: 2.82 — ABNORMAL HIGH (ref 0.00–1.49)
Prothrombin Time: 29.7 seconds — ABNORMAL HIGH (ref 11.6–15.2)

## 2014-03-16 LAB — GLUCOSE, CAPILLARY
GLUCOSE-CAPILLARY: 74 mg/dL (ref 70–99)
GLUCOSE-CAPILLARY: 221 mg/dL — AB (ref 70–99)
GLUCOSE-CAPILLARY: 209 mg/dL — AB (ref 70–99)
Glucose-Capillary: 115 mg/dL — ABNORMAL HIGH (ref 70–99)
Glucose-Capillary: 70 mg/dL (ref 70–99)
Glucose-Capillary: 135 mg/dL — ABNORMAL HIGH (ref 70–99)

## 2014-03-16 LAB — HEMOGLOBIN A1C
Hgb A1c MFr Bld: 7.2 % — ABNORMAL HIGH (ref <5.7)
Mean Plasma Glucose: 160 mg/dL — ABNORMAL HIGH (ref <117)

## 2014-03-16 LAB — LIPID PANEL
Cholesterol: 111 mg/dL (ref 0–200)
HDL: 36 mg/dL — ABNORMAL LOW (ref >39)
LDL Cholesterol: 54 mg/dL (ref 0–99)
TRIGLYCERIDES: 107 mg/dL (ref <150)
Total CHOL/HDL Ratio: 3.1 RATIO
VLDL: 21 mg/dL (ref 0–40)

## 2014-03-16 MED ORDER — WARFARIN SODIUM 3 MG PO TABS
3.0000 mg | ORAL_TABLET | Freq: Once | ORAL | Status: AC
  Administered 2014-03-16: 3 mg via ORAL
  Filled 2014-03-16: qty 1

## 2014-03-16 MED ORDER — HYDROCHLOROTHIAZIDE 12.5 MG PO CAPS
12.5000 mg | ORAL_CAPSULE | Freq: Every day | ORAL | Status: DC
  Administered 2014-03-16: 12.5 mg via ORAL
  Filled 2014-03-16 (×2): qty 1

## 2014-03-16 MED ORDER — GLUCERNA SHAKE PO LIQD
237.0000 mL | Freq: Two times a day (BID) | ORAL | Status: DC
  Administered 2014-03-16 – 2014-03-17 (×3): 237 mL via ORAL

## 2014-03-16 NOTE — Care Management Note (Signed)
    Page 1 of 1   03/17/2014     3:32:22 PM CARE MANAGEMENT NOTE 03/17/2014  Patient:  Lindsay Sheppard, Lindsay Sheppard   Account Number:  [id number]  Date Initiated:  03/16/2014  Documentation initiated by:  GRAVES-BIGELOW,Hakeen Shipes  Subjective/Objective Assessment:   Pt admitted for slurred speech. MRI-.     Action/Plan:   Pt is active with [facility] for [facility], PT OT services. Will need resumption orders when stable for d/c.   Anticipated DC Date:  03/17/2014   Anticipated DC Plan:  [place]  CM consult      [facility] Choice  Resumption Of Svcs/PTA Provider   Choice offered to / List presented to:          [facility] arranged  HH-1 RN  [place] OT      [place].   Status of service:  Completed, signed off Medicare Important Message given?  NA - LOS <3 / Initial given by admissions (If response is "NO", the following Medicare IM given date fields will be blank) Date Medicare IM given:   Medicare IM given by:   Date Additional Medicare IM given:   Additional Medicare IM given by:    Discharge Disposition:  [place]  Per UR Regulation:  Reviewed for med. necessity/level of care/duration of stay  If discussed at [place] of Stay Meetings, dates discussed:    Comments:

## 2014-03-16 NOTE — Evaluation (Signed)
Clinical/Bedside Swallow Evaluation Patient Details  Name: Lindsay Sheppard MRN: [id number] Date of Birth: Dec 03, 1920  Today's Date: 03/16/2014 Time: 5284-1324 SLP Time Calculation (min): 20 min  Past Medical History:  Past Medical History  Diagnosis Date  . POSTHERPETIC NEURALGIA 11/02/2009  . B12 DEFICIENCY 05/23/2007  . HYPERLIPIDEMIA 05/23/2007  . HYPERKALEMIA 05/30/2010  . ANEMIA-IRON DEFICIENCY 01/26/2007  . BLEPHARITIS, BILATERAL 02/23/2008  . Other specified forms of hearing loss 05/30/2010  . HYPERTENSION 01/26/2007  . Atrial fibrillation 05/23/2007  . DIASTOLIC HEART FAILURE, CHRONIC 12/14/2009  . POSTURAL HYPOTENSION 02/07/2008  . ALLERGIC RHINITIS 11/02/2009  . GERD 01/26/2007  . CONSTIPATION 01/25/2008  . SKIN LESION 05/30/2010  . BACK PAIN 08/02/2007  . OSTEOPOROSIS 05/23/2007  . SYNCOPE 02/07/2008  . FATIGUE 09/06/2007  . Dysuria 09/05/2008  . Abdominal pain, epigastric 09/06/2007  . EPIGASTRIC TENDERNESS 10/12/2007  . PULMONARY EMBOLISM, HX OF 05/23/2007  . SHINGLES, HX OF 05/23/2007  . 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 Left foot pain   . Diabetic neuropathy   . RENAL INSUFFICIENCY 07/02/2009  . RENAL CYST, LEFT 09/06/2007  . Kidney stones     "never had OR"  . Family history of anesthesia complication     "daughter has PONV; another daughter a little anesthesia lasts way too long"  . CHF (congestive heart failure) 2011  . Myocardial infarction 2011    "mild"  . DVT (deep venous thrombosis) ~ 1964    "think it was in the left"  . Aspiration pneumonia ~ 2006    "due to aspiration"  . DIABETES MELLITUS, TYPE II 09/06/2007  . DEPRESSIVE DISORDER 01/25/2008    "@ times; never treated for it"  . Melanoma of nose   . Compression fracture of lumbar vertebra     hx  . Compression fracture of thoracic vertebra 02/07/2014    "fell this am" (02/07/2014  . Arthritis     "hands" (03/15/2014)   Past Surgical History:  Past Surgical History  Procedure Laterality Date  . Appendectomy  1943  .  Abdominal hysterectomy  1964  . Cardiac electrophysiology mapping and ablation  1999  . Cataract extraction w/ intraocular lens  implant, bilateral  2003-2005  . Carpal tunnel release Right 2004  . Mohs surgery  2011    "removed off her nose"  . Cardiac catheterization  10/2009  . Esophageal dilation  X 64   HPI:  80 year old female with history of hypertension, atrial fibrillation on chronic anticoagulation with Coumadin, history of pulmonary embolism, iron deficiency anemia, GERD, chronic diastolic heart failure, diabetes mellitus, chronic back pain, compression fracture of thoracic and lumbar vertebra was brought to the ED after patient had an episode of slurred speech/ drooling at home. No acute findings on head CT or MRI on 7/15. Had MBS- 06/2005, 07/2005, and 09/2005. Unable to pull up report but indicative of hx of dysphagia. Bedside swallow eval ordered to evaluate swallow function given this history.   Assessment / Plan / Recommendation Clinical Impression  Pt demonstrated no overt s/s of aspiration at bedside- swallow appeared timely with adequate hyolaryngeal elevation across consistencies. Pt had brief hx of dysphagia in 2006 d/t AMS but daughter reports that swallow difficulties have since resolved and that she consumes a regular diet. Given these findings pt risk of aspiration is low. Daughter did report that pt has difficulties biting in to regular solid foods and requested for meats to be chopped; we discussed dysphagia 3 diet but felt this would be unnecessarily restrictive. Recommending  regular diet exception of chopped meats, thin liquids, straws ok, meds whole with liquid. Also recommend providing intermittent supervision to ensure pt is not having difficulties with feeding. Given hx of GERD, also recommend that pt sit upright at least 30 minutes after meals. ST will sign off at this time; please re-consult if difficulties arise.    Aspiration Risk  Mild    Diet Recommendation  Regular;Thin liquid (meats chopped)   Liquid Administration via: Cup;Straw Medication Administration: Whole meds with liquid Supervision: Patient able to self feed;Intermittent supervision to cue for compensatory strategies Compensations: Small sips/bites;Slow rate Postural Changes and/or Swallow Maneuvers: Seated upright 90 degrees;Upright 30-60 min after meal    Other  Recommendations Oral Care Recommendations: Oral care BID   Follow Up Recommendations  None    Frequency and Duration        Pertinent Vitals/Pain n/a    SLP Swallow Goals     Swallow Study Prior Functional Status       General HPI: 80 year old female with history of hypertension, atrial fibrillation on chronic anticoagulation with Coumadin, history of pulmonary embolism, iron deficiency anemia, GERD, chronic diastolic heart failure, diabetes mellitus, chronic back pain, compression fracture of thoracic and lumbar vertebra was brought to the ED after patient had an episode of slurred speech/ drooling at home. No acute findings on head CT or MRI on 7/15. Had MBS- 06/2005, 07/2005, and 09/2005. Unable to pull up report but indicative of hx of dysphagia. Bedside swallow eval ordered to evaluate swallow function given this history. Type of Study: Bedside swallow evaluation Previous Swallow Assessment: MBS in 2006 d/t AMS Diet Prior to this Study: NPO Temperature Spikes Noted: No Respiratory Status: Room air History of Recent Intubation: No Behavior/Cognition: Alert;Cooperative Oral Cavity - Dentition: Dentures, top;Dentures, bottom Self-Feeding Abilities: Able to feed self Patient Positioning: Upright in bed Baseline Vocal Quality: Clear Volitional Cough: Strong Volitional Swallow: Able to elicit    Oral/Motor/Sensory Function Overall Oral Motor/Sensory Function: Appears within functional limits for tasks assessed Labial ROM: Within Functional Limits Labial Symmetry: Within Functional Limits Labial Strength:  Within Functional Limits Lingual ROM: Within Functional Limits Lingual Symmetry: Within Functional Limits Lingual Strength: Within Functional Limits   Ice Chips Ice chips: Not tested   Thin Liquid Thin Liquid: Within functional limits Presentation: Cup;Straw    Nectar Thick Nectar Thick Liquid: Not tested   Honey Thick Honey Thick Liquid: Not tested   Puree Puree: Within functional limits Presentation: Self Fed;Spoon   Solid   GO    Solid: Within functional limits Presentation: Self Patton Salles, Amy K, MA, CCC-SLP 03/16/2014,9:06 AM

## 2014-03-16 NOTE — Progress Notes (Signed)
[facility] PROGRESS NOTE  Lindsay Sheppard AST:[id number] DOB: 11/26/1920 DOA: 03/15/2014 PCP: [name], MD  Assessment/Plan: Active Problems: Stroke like Episode - Neurology on board and further recommendations pending  HTN - Will add HCTZ - PRN IV hydralazine and labetalol on board - On clonidine    Protein-calorie malnutrition, severe - Registered dietitian on board - Currently recommended Glucerna shake by mouth twice a day  Code Status: full Family Communication: Discussed with patient and daughter  Disposition Plan: Pending neurology evaluation and recommendations.  Consultants:  Neurology  Procedures:  CT of head  MR of head  Antibiotics:  None  HPI/Subjective: Pt has no new complaints. No acute issues reported overnight.  Objective: Filed Vitals:   03/16/14 0600  BP: 187/75  Pulse: 73  Temp: 97.8 F (36.6 C)  Resp: 15    Intake/Output Summary (Last 24 hours) at 03/16/14 1159 Last data filed at 03/16/14 0600  Gross per 24 hour  Intake 836.25 ml  Output    300 ml  Net 536.25 ml   Filed Weights   03/15/14 1341 03/15/14 1959  Weight: 55.5 kg (122 lb 5.7 oz) 54.4 kg (119 lb 14.9 oz)    Exam:   General:  Pt in NAD, alert and awake  Cardiovascular: RRR, no Murmurs  Respiratory: CTA BL, no wheezes  Abdomen: soft, nd, nt  Musculoskeletal:  No cyanosis or clubbing  Neuro: Pt answers questions appropriately, equal grip strength, Leg extension strength equal, no facial asymmetry.  Data Reviewed: Basic Metabolic Panel:  Recent Labs Lab 03/15/14 1307 03/15/14 1316  NA 138 137  K 4.8 4.6  CL 100 105  CO2 25  --   GLUCOSE 186* 185*  BUN 31* 33*  CREATININE 1.19* 1.20*  CALCIUM 9.8  --    Liver Function Tests:  Recent Labs Lab 03/15/14 1307  AST 24  ALT 20  ALKPHOS 73  BILITOT 0.3  PROT 6.2  ALBUMIN 3.6   No results found for this basename: LIPASE, AMYLASE,  in the last 168 hours No results found for this  basename: AMMONIA,  in the last 168 hours CBC:  Recent Labs Lab 03/15/14 1307 03/15/14 1316  WBC 5.6  --   NEUTROABS 4.1  --   HGB 13.2 14.3  HCT 41.1 42.0  MCV 91.9  --   PLT 148*  --    Cardiac Enzymes: No results found for this basename: CKTOTAL, CKMB, CKMBINDEX, TROPONINI,  in the last 168 hours BNP (last 3 results) No results found for this basename: PROBNP,  in the last 8760 hours CBG:  Recent Labs Lab 03/15/14 1334 03/15/14 2026 03/16/14 0151 03/16/14 0427 03/16/14 0737  GLUCAP 179* 97 74 115* 70    No results found for this or any previous visit (from the past 240 hour(s)).   Studies: Ct Head (brain) Wo Contrast  03/15/2014   CLINICAL DATA:  Code stroke.  EXAM: CT HEAD WITHOUT CONTRAST  TECHNIQUE: Contiguous axial images were obtained from the base of the skull through the vertex without intravenous contrast.  COMPARISON:  Head CT 02/07/2014.  FINDINGS: No mass. No hydrocephalus. No hemorrhage. The visualized orbits are unremarkable. Visualized paranasal sinuses and mastoids are clear.  IMPRESSION: No acute abnormality. These results were called by telephone at the time of interpretation on 03/15/2014 at 1:24 pm to Dr.Knapp, who verbally acknowledged these results.   Electronically Signed   By: Marcello Moores  Register   On: 03/15/2014 13:27   Mr Brain Wo Contrast  03/15/2014   CLINICAL DATA:  Episode of slurred speech.  Evaluate for CVA.  EXAM: MRI HEAD WITHOUT CONTRAST  TECHNIQUE: Multiplanar, multiecho pulse sequences of the brain and surrounding structures were obtained without intravenous contrast.  COMPARISON:  CT head without contrast 03/15/2014. MRI brain 3/17/ 0 6  FINDINGS: The diffusion-weighted images demonstrate no evidence for acute or subacute infarct. Moderate generalized atrophy is present. Minimal white matter disease is within normal limits for age. No hemorrhage or mass lesion is present. Mild white matter changes extend into the brainstem.  Flow is present in  the major intracranial arteries. The patient is status post bilateral lens replacements. Minimal mucosal thickening is present in the anterior ethmoid air cells and frontal sinuses. There is minimal fluid in the mastoid air cells. No obstructing nasopharyngeal lesion is evident.  IMPRESSION: 1. No acute intracranial abnormality. 2. Atrophy and white matter disease is likely within normal limits for age. 3. Minimal sinus disease.   Electronically Signed   By: Lawrence Santiago M.D.   On: 03/15/2014 16:57    Scheduled Meds: .  stroke: mapping our early stages of recovery book   Does not apply Once  . acetaminophen  650 mg Oral TID  . cloNIDine  0.2 mg Transdermal Weekly  . feeding supplement (GLUCERNA SHAKE)  237 mL Oral BID BM  . insulin aspart  0-9 Units Subcutaneous TID WC  . insulin glargine  5 Units Subcutaneous Daily  . labetalol  400 mg Oral BID  . pantoprazole  40 mg Oral Daily  . polyethylene glycol  17 g Oral BID  . simvastatin  10 mg Oral q1800  . warfarin  3 mg Oral ONCE-1800  . Warfarin - Pharmacist Dosing Inpatient   Does not apply q1800   Continuous Infusions: . dextrose 5 % and 0.45% NaCl 75 mL/hr at 03/15/14 1851    Active Problems:   Stroke   CVA (cerebral infarction)   Protein-calorie malnutrition, severe   Time spent: > 35 minutes   Velvet Bathe  [facility] Pager [phone]. If 7PM-7AM, please contact night-coverage at [email or web address], password [facility] 03/16/2014, 11:59 AM  LOS: 1 day

## 2014-03-16 NOTE — Progress Notes (Signed)
UR Completed Namish Krise Graves-Bigelow, RN,BSN [phone]  

## 2014-03-16 NOTE — Progress Notes (Signed)
INITIAL NUTRITION ASSESSMENT  DOCUMENTATION CODES Per approved criteria  -Severe malnutrition in the context of chronic illness  Pt meets criteria for severe MALNUTRITION in the context of chronic illness as evidenced by severe fat and muscle wasting.  INTERVENTION: - Glucerna Shake po BID, each supplement provides 220 kcal and 10 grams of protein - Continue to encourage adequate intake of meals and snacks.  NUTRITION DIAGNOSIS: Malnutrition related to chronic illness as evidenced by severe fat and muscle wasting.   Goal: Pt to meet >/= 90% of their estimated nutrition needs   Monitor:  Weight, po intake, acceptance of supplements, labs  Reason for Assessment: MST  80 y.o. female  Admitting Dx: <principal problem not specified>  ASSESSMENT: 80 year old female with history of hypertension, atrial fibrillation on chronic anticoagulation with Coumadin, history of pulmonary embolism, iron deficiency*anemia, GERD, chronic diastolic heart failure, diabetes mellitus, chronic back pain, compression fracture of thoracic and lumbar vertebra was brought to the ED after patient had an episode of slurred speech at home.   Pt's daughter reports that pt's weight has been stable. She reports that pt eats well at home. Pt says that she has an appetite and gets hungry around meal times. Pt is currently on a heart healthy diet/carbohydrate modified diet with chopped meats. Pt has trouble chewing tough meats.   Nutrition Focused Physical Exam:  Subcutaneous Fat:  Orbital Region: severe wasting Upper Arm Region: severe wasting Thoracic and Lumbar Region: moderate wasting  Muscle:  Temple Region: severe wasting Clavicle Bone Region: severe wasting Clavicle and Acromion Bone Region: severe wasting Scapular Bone Region: moderate wasting Dorsal Hand: moderate wasting Patellar Region: WNL Anterior Thigh Region: WNL Posterior Calf Region: WNL  Edema: none  Height: Ht Readings from Last 1  Encounters:  03/15/14 5\' 6"  (1.676 m)    Weight: Wt Readings from Last 1 Encounters:  03/15/14 119 lb 14.9 oz (54.4 kg)    Ideal Body Weight: 59.3 kg  % Ideal Body Weight: 92%  Wt Readings from Last 10 Encounters:  03/15/14 119 lb 14.9 oz (54.4 kg)  02/23/14 122 lb (55.339 kg)  02/16/14 123 lb 3.2 oz (55.883 kg)  02/09/14 116 lb 13.5 oz (53 kg)  11/30/13 130 lb (58.968 kg)  11/08/13 130 lb (58.968 kg)  11/01/13 129 lb 4 oz (58.627 kg)  10/04/13 133 lb (60.328 kg)  09/27/13 133 lb 6.4 oz (60.51 kg)  05/12/13 135 lb (61.236 kg)    Usual Body Weight: 116 lbs  % Usual Body Weight: 103%  BMI:  Body mass index is 19.37 kg/(m^2).  Estimated Nutritional Needs: Kcal: 1450-1600 Protein: 70-80 g Fluid: 1.5-1.6 L/day  Skin: Intact  Diet Order:    EDUCATION NEEDS: -Education needs addressed   Intake/Output Summary (Last 24 hours) at 03/16/14 1024 Last data filed at 03/16/14 0600  Gross per 24 hour  Intake 836.25 ml  Output    300 ml  Net 536.25 ml    Last BM: PTA   Labs:   Recent Labs Lab 03/15/14 1307 03/15/14 1316  NA 138 137  K 4.8 4.6  CL 100 105  CO2 25  --   BUN 31* 33*  CREATININE 1.19* 1.20*  CALCIUM 9.8  --   GLUCOSE 186* 185*    CBG (last 3)   Recent Labs  03/16/14 0151 03/16/14 0427 03/16/14 0737  GLUCAP 74 115* 70    Scheduled Meds: .  stroke: mapping our early stages of recovery book   Does not apply Once  .  acetaminophen  650 mg Oral TID  . cloNIDine  0.2 mg Transdermal Weekly  . insulin aspart  0-9 Units Subcutaneous TID WC  . insulin glargine  5 Units Subcutaneous Daily  . labetalol  400 mg Oral BID  . pantoprazole  40 mg Oral Daily  . polyethylene glycol  17 g Oral BID  . simvastatin  10 mg Oral q1800  . warfarin  3 mg Oral ONCE-1800  . Warfarin - Pharmacist Dosing Inpatient   Does not apply q1800    Continuous Infusions: . dextrose 5 % and 0.45% NaCl 75 mL/hr at 03/15/14 1851    Past Medical History  Diagnosis  Date  . POSTHERPETIC NEURALGIA 11/02/2009  . B12 DEFICIENCY 05/23/2007  . HYPERLIPIDEMIA 05/23/2007  . HYPERKALEMIA 05/30/2010  . ANEMIA-IRON DEFICIENCY 01/26/2007  . BLEPHARITIS, BILATERAL 02/23/2008  . Other specified forms of hearing loss 05/30/2010  . HYPERTENSION 01/26/2007  . Atrial fibrillation 05/23/2007  . DIASTOLIC HEART FAILURE, CHRONIC 12/14/2009  . POSTURAL HYPOTENSION 02/07/2008  . ALLERGIC RHINITIS 11/02/2009  . GERD 01/26/2007  . CONSTIPATION 01/25/2008  . SKIN LESION 05/30/2010  . BACK PAIN 08/02/2007  . OSTEOPOROSIS 05/23/2007  . SYNCOPE 02/07/2008  . FATIGUE 09/06/2007  . Dysuria 09/05/2008  . Abdominal pain, epigastric 09/06/2007  . EPIGASTRIC TENDERNESS 10/12/2007  . PULMONARY EMBOLISM, HX OF 05/23/2007  . SHINGLES, HX OF 05/23/2007  . 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 Left foot pain   . Diabetic neuropathy   . RENAL INSUFFICIENCY 07/02/2009  . RENAL CYST, LEFT 09/06/2007  . Kidney stones     "never had OR"  . Family history of anesthesia complication     "daughter has PONV; another daughter a little anesthesia lasts way too long"  . CHF (congestive heart failure) 2011  . Myocardial infarction 2011    "mild"  . DVT (deep venous thrombosis) ~ 1964    "think it was in the left"  . Aspiration pneumonia ~ 2006    "due to aspiration"  . DIABETES MELLITUS, TYPE II 09/06/2007  . DEPRESSIVE DISORDER 01/25/2008    "@ times; never treated for it"  . Melanoma of nose   . Compression fracture of lumbar vertebra     hx  . Compression fracture of thoracic vertebra 02/07/2014    "fell this am" (02/07/2014  . Arthritis     "hands" (03/15/2014)    Past Surgical History  Procedure Laterality Date  . Appendectomy  1943  . Abdominal hysterectomy  1964  . Cardiac electrophysiology mapping and ablation  1999  . Cataract extraction w/ intraocular lens  implant, bilateral  2003-2005  . Carpal tunnel release Right 2004  . Mohs surgery  2011    "removed off her nose"  . Cardiac catheterization   10/2009  . Esophageal dilation  X 2    Terrace Arabia RD, LDN

## 2014-03-16 NOTE — Progress Notes (Signed)
Subjective: patients symptoms have resolved.   Objective: Current vital signs: BP 165/63  Pulse 79  Temp(Src) 97.6 F (36.4 C) (Oral)  Resp 16  Ht 5\' 6"  (1.676 m)  Wt 54.4 kg (119 lb 14.9 oz)  BMI 19.37 kg/m2  SpO2 99% Vital signs in last 24 hours: Temp:  [97.1 F (36.2 C)-98.1 F (36.7 C)] 97.6 F (36.4 C) (07/16 1235) Pulse Rate:  [70-98] 79 (07/16 1235) Resp:  [15-18] 16 (07/16 1235) BP: (112-225)/(63-100) 165/63 mmHg (07/16 1235) SpO2:  [95 %-99 %] 99 % (07/16 1235) Weight:  [54.4 kg (119 lb 14.9 oz)] 54.4 kg (119 lb 14.9 oz) (07/15 1959)  Intake/Output from previous day: 07/15 0701 - 07/16 0700 In: 836.3 [I.V.:836.3] Out: 300 [Urine:300] Intake/Output this shift:   Nutritional status:    Neurologic Exam: General: NAD Mental Status: Alert, oriented, thought content appropriate.  Speech fluent without evidence of aphasia.  Able to follow 3 step commands without difficulty. Cranial Nerves: II: Discs flat bilaterally; Visual fields grossly normal, pupils equal, round, reactive to light and accommodation III,IV, VI: ptosis not present, extra-ocular motions intact bilaterally V,VII: smile symmetric, facial light touch sensation normal bilaterally VIII: hearing normal bilaterally IX,X: gag reflex present XI: bilateral shoulder shrug XII: midline tongue extension without atrophy or fasciculations  Motor: Right : Upper extremity   5/5    Left:     Upper extremity   5/5  Lower extremity   5/5     Lower extremity   5/5 Tone and bulk:normal tone throughout; no atrophy noted Sensory: Pinprick and light touch intact throughout, bilaterally Deep Tendon Reflexes:  Right: Upper Extremity   Left: Upper extremity   biceps (C-5 to C-6) 2/4   biceps (C-5 to C-6) 2/4 tricep (C7) 2/4    triceps (C7) 2/4 Brachioradialis (C6) 2/4  Brachioradialis (C6) 2/4  Lower Extremity Lower Extremity  quadriceps (L-2 to L-4) 2/4   quadriceps (L-2 to L-4) 2/4 Achilles (S1) 2/4   Achilles  (S1) 2/4  Plantars: Right: downgoing   Left: downgoing Cerebellar: normal finger-to-nose,  normal heel-to-shin test Gait: not tested CV: pulses palpable throughout    Lab Results: Basic Metabolic Panel:  Recent Labs Lab 03/15/14 1307 03/15/14 1316  NA 138 137  K 4.8 4.6  CL 100 105  CO2 25  --   GLUCOSE 186* 185*  BUN 31* 33*  CREATININE 1.19* 1.20*  CALCIUM 9.8  --     Liver Function Tests:  Recent Labs Lab 03/15/14 1307  AST 24  ALT 20  ALKPHOS 73  BILITOT 0.3  PROT 6.2  ALBUMIN 3.6   No results found for this basename: LIPASE, AMYLASE,  in the last 168 hours No results found for this basename: AMMONIA,  in the last 168 hours  CBC:  Recent Labs Lab 03/15/14 1307 03/15/14 1316  WBC 5.6  --   NEUTROABS 4.1  --   HGB 13.2 14.3  HCT 41.1 42.0  MCV 91.9  --   PLT 148*  --     Cardiac Enzymes: No results found for this basename: CKTOTAL, CKMB, CKMBINDEX, TROPONINI,  in the last 168 hours  Lipid Panel:  Recent Labs Lab 03/16/14 0610  CHOL 111  TRIG 107  HDL 36*  CHOLHDL 3.1  VLDL 21  LDLCALC 54    CBG:  Recent Labs Lab 03/15/14 1334 03/15/14 2026 03/16/14 0151 03/16/14 0427 03/16/14 0737  GLUCAP 179* 97 74 115* 70    Microbiology: Results for orders placed during  the hospital encounter of 02/07/14  URINE CULTURE     Status: None   Collection Time    02/07/14  8:45 AM      Result Value Ref Range Status   Specimen Description URINE, CATHETERIZED   Final   Special Requests Normal   Final   Culture  Setup Time     Final   Value: 02/07/2014 11:15     Performed at [place] [facility]     Final   Value: NO GROWTH     Performed at [facility]   Culture     Final   Value: NO GROWTH     Performed at [facility]   Report Status 02/08/2014 FINAL   Final    Coagulation Studies:  Recent Labs  03/15/14 1307 03/16/14 0610  LABPROT 28.6* 29.7*  INR 2.69* 2.82*    Imaging: Ct Head (brain) Wo  Contrast  03/15/2014   CLINICAL DATA:  Code stroke.  EXAM: CT HEAD WITHOUT CONTRAST  TECHNIQUE: Contiguous axial images were obtained from the base of the skull through the vertex without intravenous contrast.  COMPARISON:  Head CT 02/07/2014.  FINDINGS: No mass. No hydrocephalus. No hemorrhage. The visualized orbits are unremarkable. Visualized paranasal sinuses and mastoids are clear.  IMPRESSION: No acute abnormality. These results were called by telephone at the time of interpretation on 03/15/2014 at 1:24 pm to Dr.Knapp, who verbally acknowledged these results.   Electronically Signed   By: Marcello Moores  Register   On: 03/15/2014 13:27   Mr Brain Wo Contrast  03/15/2014   CLINICAL DATA:  Episode of slurred speech.  Evaluate for CVA.  EXAM: MRI HEAD WITHOUT CONTRAST  TECHNIQUE: Multiplanar, multiecho pulse sequences of the brain and surrounding structures were obtained without intravenous contrast.  COMPARISON:  CT head without contrast 03/15/2014. MRI brain 3/17/ 0 6  FINDINGS: The diffusion-weighted images demonstrate no evidence for acute or subacute infarct. Moderate generalized atrophy is present. Minimal white matter disease is within normal limits for age. No hemorrhage or mass lesion is present. Mild white matter changes extend into the brainstem.  Flow is present in the major intracranial arteries. The patient is status post bilateral lens replacements. Minimal mucosal thickening is present in the anterior ethmoid air cells and frontal sinuses. There is minimal fluid in the mastoid air cells. No obstructing nasopharyngeal lesion is evident.  IMPRESSION: 1. No acute intracranial abnormality. 2. Atrophy and white matter disease is likely within normal limits for age. 3. Minimal sinus disease.   Electronically Signed   By: Lawrence Santiago M.D.   On: 03/15/2014 16:57    Medications:  Scheduled: .  stroke: mapping our early stages of recovery book   Does not apply Once  . acetaminophen  650 mg Oral TID  .  cloNIDine  0.2 mg Transdermal Weekly  . feeding supplement (GLUCERNA SHAKE)  237 mL Oral BID BM  . hydrochlorothiazide  12.5 mg Oral Daily  . insulin aspart  0-9 Units Subcutaneous TID WC  . insulin glargine  5 Units Subcutaneous Daily  . labetalol  400 mg Oral BID  . pantoprazole  40 mg Oral Daily  . polyethylene glycol  17 g Oral BID  . simvastatin  10 mg Oral q1800  . warfarin  3 mg Oral ONCE-1800  . Warfarin - Pharmacist Dosing Inpatient   Does not apply q1800    Assessment/Plan:  80 y.o. female with Afib on chronic coumadin. Patient presented to ED  with slurred speech. On exam speech was clear but does show a left upper quadrant field cut and left UE drift. Although not a tPA candidate due to elevated INR, cannot fully rule out CVA. MRI was obtained showing no acute infarct, Echo was normal with no thrombus, LDL WNL.  Symptoms have cleared at this time. Cannot exclude possible TIA.  At this time no further evaluation recommended.  Continue Coumadin per pharmacy.   Neurology S/O  Etta Quill PA-C [facility] [phone]  03/16/2014, 1:47 PM

## 2014-03-16 NOTE — Progress Notes (Signed)
Chaplain responded to spiritual care consult in which patient requested prayer. Offered spiritual/emotional support to patient and her daughter, Lindsay Sheppard. Lindsay Sheppard explained to chaplain that her mother, the patient, is hard of hearing. Lindsay Sheppard said "we thought she had a stroke yesterday but MRI was clear, so we don't know what happened." Patient and all of her children live on the family farm in [place], on their own lots. Patient has a church home in [place] with a Engineer, maintenance (IT). She asked chaplain to pray for her and her family. Chaplain provided emotional support through empathic listening, pastoral presence, and prayer. They appreciated support and are aware of chaplain services and availability.   Ethelene Browns [phone]

## 2014-03-16 NOTE — Consult Note (Signed)
ANTICOAGULATION CONSULT NOTE - Follow Up Consult  Pharmacy Consult for Coumadin Indication: atrial fibrillation, hx PE  Allergies  Allergen Reactions  . Amiodarone     REACTION: f? fluid retention  . Deltasone [Prednisone] Other (See Comments)    unknown  . Diazepam     REACTION: agitation  . Diltiazem Hcl Other (See Comments)    unknown  . Levofloxacin     REACTION: nausea  . Pregabalin     REACTION: hallucinations  . Spironolactone     REACTION: elevated K at lowest dose  . Tequin [Gatifloxacin] Other (See Comments)    Hypoglycemia    Patient Measurements: Height: 5\' 6"  (167.6 cm) Weight: 119 lb 14.9 oz (54.4 kg) IBW/kg (Calculated) : 59.3  Vital Signs: Temp: 97.8 F (36.6 C) (07/16 0600) Temp src: Oral (07/16 0600) BP: 187/75 mmHg (07/16 0600) Pulse Rate: 73 (07/16 0600)  Labs:  Recent Labs  03/13/14 1345 03/15/14 1307 03/15/14 1316 03/16/14 0610  HGB  --  13.2 14.3  --   HCT  --  41.1 42.0  --   PLT  --  148*  --   --   APTT  --  32  --   --   LABPROT  --  28.6*  --  29.7*  INR 2.4 2.69*  --  2.82*  CREATININE  --  1.19* 1.20*  --     Estimated Creatinine Clearance: 26.8 ml/min (by C-G formula based on Cr of 1.2).  Assessment: 89yof on coumadin pta for hx afib and PE, being admitted for CVA workup. INR on admission is therapeutic at 2.69. Home dose: 3mg  daily except none on Sunday - last dose taken 7/14. Noted 3mg  was ordered last evening and was NOT given. INR this morning remains therapeutic at 2.82. CBC is stable, no bleeding noted.  Goal of Therapy:  INR 2-3 Monitor platelets by anticoagulation protocol: Yes   Plan:  1. Coumadin 3mg  x 1 tonight 2. Daily INR. CBC in the morning 3. If patient stays stable, can go to MWF INR checks 4. Follow for s/s bleeding  Janina Trafton D. Reighlyn Elmes, PharmD, BCPS Clinical Pharmacist Pager: [phone] 03/16/2014 9:58 AM

## 2014-03-17 LAB — CBC
HCT: 38.5 % (ref 36.0–46.0)
Hemoglobin: 12.2 g/dL (ref 12.0–15.0)
MCH: 29.2 pg (ref 26.0–34.0)
MCHC: 31.7 g/dL (ref 30.0–36.0)
MCV: 92.1 fL (ref 78.0–100.0)
Platelets: 149 K/uL — ABNORMAL LOW (ref 150–400)
RBC: 4.18 MIL/uL (ref 3.87–5.11)
RDW: 14.2 % (ref 11.5–15.5)
WBC: 4.7 K/uL (ref 4.0–10.5)

## 2014-03-17 LAB — PROTIME-INR
INR: 2.77 — ABNORMAL HIGH (ref 0.00–1.49)
Prothrombin Time: 29.3 s — ABNORMAL HIGH (ref 11.6–15.2)

## 2014-03-17 LAB — GLUCOSE, CAPILLARY
GLUCOSE-CAPILLARY: 163 mg/dL — AB (ref 70–99)
GLUCOSE-CAPILLARY: 218 mg/dL — AB (ref 70–99)
Glucose-Capillary: 268 mg/dL — ABNORMAL HIGH (ref 70–99)

## 2014-03-17 MED ORDER — HYDROCHLOROTHIAZIDE 25 MG PO TABS
25.0000 mg | ORAL_TABLET | Freq: Every day | ORAL | Status: DC
  Filled 2014-03-17: qty 1

## 2014-03-17 MED ORDER — CLONIDINE HCL 0.3 MG/24HR TD PTWK
0.3000 mg | MEDICATED_PATCH | TRANSDERMAL | Status: DC
  Filled 2014-03-17: qty 1

## 2014-03-17 MED ORDER — HYDROCHLOROTHIAZIDE 25 MG PO TABS
25.0000 mg | ORAL_TABLET | Freq: Every day | ORAL | Status: DC
  Administered 2014-03-17: 25 mg via ORAL
  Filled 2014-03-17: qty 1

## 2014-03-17 MED ORDER — CLONIDINE HCL 0.3 MG/24HR TD PTWK: 0.3000 mg | MEDICATED_PATCH | TRANSDERMAL | Status: DC

## 2014-03-17 MED ORDER — WARFARIN SODIUM 3 MG PO TABS
3.0000 mg | ORAL_TABLET | Freq: Once | ORAL | Status: AC
  Administered 2014-03-17: 3 mg via ORAL
  Filled 2014-03-17: qty 1

## 2014-03-17 MED ORDER — HYDROCHLOROTHIAZIDE 25 MG PO TABS: 25.0000 mg | ORAL_TABLET | Freq: Every day | ORAL | Status: DC

## 2014-03-17 NOTE — Discharge Instructions (Signed)

## 2014-03-17 NOTE — Evaluation (Signed)
Physical Therapy Evaluation Patient Details Name: Lindsay Sheppard MRN: [id number] DOB: May 10, 1921 Today's Date: 03/17/2014   History of Present Illness   80 y.o. female with PMH of HTN, diabetes, HLD, B12 deficiency, CKD stage 3; recurrent UTI (on suppression therapy with bactrim); atrial fibrillation ( on coumadin), GERD and anemia. Patient reports severe back pain with  7 compression fractures.  Has hx of syncope.  Admit this admit with ? CVA.  MRI negative.    Clinical Impression  Pt admitted with above. Pt currently with functional limitations due to the deficits listed below (see PT Problem List).  Pt will benefit from skilled PT to increase their independence and safety with mobility to allow discharge to the venue listed below.     Follow Up Recommendations Home health PT;Supervision/Assistance - 24 hour    Equipment Recommendations   (gait belt)    Recommendations for Other Services       Precautions / Restrictions Precautions Precautions: Fall Restrictions Weight Bearing Restrictions: No      Mobility  Bed Mobility Overal bed mobility: Modified Independent                Transfers Overall transfer level: Needs assistance Equipment used: Rolling walker (2 wheeled) Transfers: Sit to/from Stand Sit to Stand: Mod assist         General transfer comment: Needed assist for power up and steadying assist due to posterior lean.    Ambulation/Gait Ambulation/Gait assistance: Min assist;Mod assist Ambulation Distance (Feet): 45 Feet Assistive device: Rolling walker (2 wheeled) Gait Pattern/deviations: Step-to pattern;Decreased stride length;Decreased step length - right;Decreased step length - left;Shuffle;Staggering right;Staggering left;Trunk flexed;Wide base of support   Gait velocity interpretation: Below normal speed for age/gender General Gait Details: Pt initially obtained balance and only required min assist for sequencing.  Once pt turned her LEs became  weaker and pt buckling needing mod assist but with cues was able to ambulate back to room with use of gait belt to assist.  Daughter present and allowed daughter to take over to finish walk so that she could decide if she can care for pt at home.  Daughter has been caring for pt since last admit.  She states that she nor siblings have been walking pt that the therapist was doing so.  She states they have it set up so she does transfer to chairs and 3N1 only.  Recommended that daughter get gait belt and daughter felt comfortable with that that they can go home today with it and HHPT f/u.    Stairs            Wheelchair Mobility    Modified Rankin (Stroke Patients Only)       Balance Overall balance assessment: Needs assistance;History of Falls Sitting-balance support: Bilateral upper extremity supported;Feet supported Sitting balance-Leahy Scale: Poor Sitting balance - Comments: Needs UE support.   Standing balance support: Bilateral upper extremity supported;During functional activity Standing balance-Leahy Scale: Poor Standing balance comment: Needs UE support                              Pertinent Vitals/Pain VSS, no pain    Home Living Family/patient expects to be discharged to:: Private residence Living Arrangements: Children Available Help at Discharge: Family (5 children) Type of Home: House Home Access: Stairs to enter Entrance Stairs-Rails: Right;Left;Can reach both Entrance Stairs-Number of Steps: 2 Home Layout: Two level;Able to live on main level with bedroom/bathroom Home  Equipment: Gilford Rile - 2 wheels;Wheelchair - [employer];Shower seat;Grab bars - tub/shower;Adaptive equipment Additional Comments: Lives with son. Other family members available who live on the "farm"; pt uses BSC at night beside bed    Prior Function Level of Independence: Needs assistance   Gait / Transfers Assistance Needed: min guard assist ambulating 40 feet in home  with RW  ADL's / Homemaking Assistance Needed: bird bathes, had started dressing herself partially        Hand Dominance   Dominant Hand: Right    Extremity/Trunk Assessment   Upper Extremity Assessment: Defer to OT evaluation           Lower Extremity Assessment: Generalized weakness      Cervical / Trunk Assessment: Kyphotic  Communication   Communication: HOH  Cognition Arousal/Alertness: Awake/alert Behavior During Therapy: WFL for tasks assessed/performed                        General Comments      Exercises        Assessment/Plan    PT Assessment All further PT needs can be met in the next venue of care  PT Diagnosis Generalized weakness   PT Problem List Decreased activity tolerance;Decreased balance;Decreased mobility;Decreased knowledge of use of DME;Decreased knowledge of precautions  PT Treatment Interventions     PT Goals (Current goals can be found in the Care Plan section) Acute Rehab PT Goals PT Goal Formulation: No goals set, d/c therapy    Frequency     Barriers to discharge        Co-evaluation               End of Session Equipment Utilized During Treatment: Gait belt Activity Tolerance: Patient limited by fatigue Patient left: in bed;with call bell/phone within reach;with family/visitor present;with bed alarm set Nurse Communication: Mobility status         Time: 1450-1518 PT Time Calculation (min): 28 min   Charges:   PT Evaluation $Initial PT Evaluation Tier I: 1 Procedure PT Treatments $Gait Training: 8-22 mins   PT G Codes:          INGOLD,Gaylen Venning 04/12/14, 4:18 PM [facility] Acute Rehabilitation [phone] (pager)

## 2014-03-17 NOTE — Consult Note (Signed)
ANTICOAGULATION CONSULT NOTE - Follow Up Consult  Pharmacy Consult for Coumadin Indication: atrial fibrillation, hx PE  Allergies  Allergen Reactions  . Amiodarone     REACTION: f? fluid retention  . Deltasone [Prednisone] Other (See Comments)    unknown  . Diazepam     REACTION: agitation  . Diltiazem Hcl Other (See Comments)    unknown  . Levofloxacin     REACTION: nausea  . Pregabalin     REACTION: hallucinations  . Spironolactone     REACTION: elevated K at lowest dose  . Tequin [Gatifloxacin] Other (See Comments)    Hypoglycemia    Patient Measurements: Height: 5\' 6"  (167.6 cm) Weight: 119 lb 14.9 oz (54.4 kg) IBW/kg (Calculated) : 59.3  Vital Signs: Temp: 98.3 F (36.8 C) (07/17 0614) Temp src: Oral (07/17 0614) BP: 148/55 mmHg (07/17 1035) Pulse Rate: 73 (07/17 1035)  Recent Labs  03/15/14 1307 03/15/14 1316 03/16/14 0610 03/17/14 0325  HGB 13.2 14.3  --  12.2  HCT 41.1 42.0  --  38.5  PLT 148*  --   --  149*  APTT 32  --   --   --   LABPROT 28.6*  --  29.7* 29.3*  INR 2.69*  --  2.82* 2.77*  CREATININE 1.19* 1.20*  --   --     Estimated Creatinine Clearance: 26.8 ml/min (by C-G formula based on Cr of 1.2).  Assessment: 80yo F on coumadin pta for hx afib and PE, admitted for CVA workup. Home dose: 3mg  daily except none on Sunday. Noted 3mg  was ordered 03/15/2014 and was NOT given. MRI did not show stroke. INR remains therapeutic - today at 2.77. CBC is stable, no bleeding noted.  Goal of Therapy:  INR 2-3 Monitor platelets by anticoagulation protocol: Yes   Plan:  1. Coumadin 3mg  x 1 tonight 2. Daily INR, s/sx of bleeding 3. If INR therapeutic tomorrow (7/18), may check INRs only MWF  Drucie Opitz, PharmD Clinical Pharmacy Resident Pager: [phone] 03/17/2014 11:41 AM

## 2014-03-17 NOTE — Discharge Summary (Signed)
Physician Discharge Summary  Lindsay Sheppard NUU:[id number] DOB: 05/18/1921 DOA: 03/15/2014  PCP: [name], MD  Admit date: 03/15/2014 Discharge date: 03/17/2014  Time spent: > 35 minutes  Recommendations for Outpatient Follow-up:  1. Please have patient f/u with pcp to reassess BMP in 1 week  Discharge Diagnoses:  Active Problems:   Stroke-like episode   CVA (cerebral infarction)   Protein-calorie malnutrition, severe   Discharge Condition: stable  Diet recommendation: Heart healthy  Filed Weights   03/15/14 1341 03/15/14 1959  Weight: 55.5 kg (122 lb 5.7 oz) 54.4 kg (119 lb 14.9 oz)    History of present illness:  From original HPI: 80 year old female with history of hypertension, atrial fibrillation on chronic anticoagulation with Coumadin, history of pulmonary embolism, iron deficiency, anemia, GERD, chronic diastolic heart failure, diabetes mellitus, chronic back pain, compression fracture of thoracic and lumbar vertebra was brought to the ED after patient had an episode of slurred speech at home  Hospital Course:  Stroke like Episode  - Neurology on board and recommended the following: 80 y.o. female with Afib on chronic coumadin. Patient presented to ED with slurred speech. On exam speech was clear but does show a left upper quadrant field cut and left UE drift. Although not a tPA candidate due to elevated INR, cannot fully rule out CVA. MRI was obtained showing no acute infarct, Echo was normal with no thrombus, LDL WNL. Symptoms have cleared at this time. Cannot exclude possible TIA. At this time no further evaluation recommended. Continue Coumadin   HTN  - Added HCTZ  - Continue clonidine   Protein-calorie malnutrition, severe  - Registered dietitian on board while patient was in house - Received nutrition supplementation while in house  Procedures:  Please see below  Consultations:  Neurology  Discharge Exam: Filed Vitals:   03/17/14 1035  BP: 148/55   Pulse: 73  Temp:   Resp:     General: Pt in NAD, alert and awake Cardiovascular: rrr, no mrg Respiratory: cta bl, no wheezes  Discharge Instructions You were cared for by a hospitalist during your hospital stay. If you have any questions about your discharge medications or the care you received while you were in the hospital after you are discharged, you can call the unit and asked to speak with the hospitalist on call if the hospitalist that took care of you is not available. Once you are discharged, your primary care physician will handle any further medical issues. Please note that NO REFILLS for any discharge medications will be authorized once you are discharged, as it is imperative that you return to your primary care physician (or establish a relationship with a primary care physician if you do not have one) for your aftercare needs so that they can reassess your need for medications and monitor your lab values.  Discharge Instructions   Call MD for:  severe uncontrolled pain    Complete by:  As directed      Call MD for:  temperature >100.4    Complete by:  As directed      Diet - low sodium heart healthy    Complete by:  As directed      Discharge instructions    Complete by:  As directed   Please have patient follow up with your pcp so that she may have a BMP rechecked at that time.     Increase activity slowly    Complete by:  As directed  Medication List         acetaminophen 325 MG tablet  Commonly known as:  TYLENOL  Take 2 tablets (650 mg total) by mouth 3 (three) times daily.     ARTIFICIAL TEARS OP  Place 1 drop into both eyes daily.     cloNIDine 0.3 mg/24hr patch  Commonly known as:  CATAPRES - Dosed in mg/24 hr  Place 0.3 mg onto the skin once a week.     CRANBERRY PO  Take 1 capsule by mouth daily.     ferrous sulfate 325 (65 FE) MG tablet  Take 1 tablet (325 mg total) by mouth 2 (two) times daily with a meal.     fexofenadine 180 MG  tablet  Commonly known as:  ALLEGRA  Take 180 mg by mouth daily.     fluticasone 50 MCG/ACT nasal spray  Commonly known as:  FLONASE  Place 2 sprays into the nose daily.     glucose blood test strip  Commonly known as:  ONE TOUCH ULTRA TEST  - Use as instructed before meals and bedtime (qid) 250.02  -   - Due to fluctuating blood sugars     hydrochlorothiazide 25 MG tablet  Commonly known as:  HYDRODIURIL  Take 1 tablet (25 mg total) by mouth daily.     insulin glargine 100 UNIT/ML injection  Commonly known as:  LANTUS  Inject 21 Units into the skin daily.     labetalol 200 MG tablet  Commonly known as:  NORMODYNE  Take 400 mg by mouth 2 (two) times daily.     pantoprazole 40 MG tablet  Commonly known as:  PROTONIX  Take 1 tablet (40 mg total) by mouth daily.     polyethylene glycol packet  Commonly known as:  MIRALAX / GLYCOLAX  Take 17 g by mouth 2 (two) times daily.     pravastatin 40 MG tablet  Commonly known as:  PRAVACHOL  Take 20 mg by mouth daily. 1/2 daily     VITAMIN B-12 IJ  Inject as directed every 30 (thirty) days.     warfarin 2 MG tablet  Commonly known as:  COUMADIN  Take 3 mg by mouth daily. 3mg  every day except on Sunday. Sunday do not take any.       Allergies  Allergen Reactions  . Amiodarone     REACTION: f? fluid retention  . Deltasone [Prednisone] Other (See Comments)    unknown  . Diazepam     REACTION: agitation  . Diltiazem Hcl Other (See Comments)    unknown  . Levofloxacin     REACTION: nausea  . Pregabalin     REACTION: hallucinations  . Spironolactone     REACTION: elevated K at lowest dose  . Tequin [Gatifloxacin] Other (See Comments)    Hypoglycemia      The results of significant diagnostics from this hospitalization (including imaging, microbiology, ancillary and laboratory) are listed below for reference.    Significant Diagnostic Studies: Ct Head (brain) Wo Contrast  03/15/2014   CLINICAL DATA:  Code  stroke.  EXAM: CT HEAD WITHOUT CONTRAST  TECHNIQUE: Contiguous axial images were obtained from the base of the skull through the vertex without intravenous contrast.  COMPARISON:  Head CT 02/07/2014.  FINDINGS: No mass. No hydrocephalus. No hemorrhage. The visualized orbits are unremarkable. Visualized paranasal sinuses and mastoids are clear.  IMPRESSION: No acute abnormality. These results were called by telephone at the time of interpretation on 03/15/2014 at 1:24  pm to Dr.Knapp, who verbally acknowledged these results.   Electronically Signed   By: Marcello Moores  Register   On: 03/15/2014 13:27   Mr Brain Wo Contrast  03/15/2014   CLINICAL DATA:  Episode of slurred speech.  Evaluate for CVA.  EXAM: MRI HEAD WITHOUT CONTRAST  TECHNIQUE: Multiplanar, multiecho pulse sequences of the brain and surrounding structures were obtained without intravenous contrast.  COMPARISON:  CT head without contrast 03/15/2014. MRI brain 3/17/ 0 6  FINDINGS: The diffusion-weighted images demonstrate no evidence for acute or subacute infarct. Moderate generalized atrophy is present. Minimal white matter disease is within normal limits for age. No hemorrhage or mass lesion is present. Mild white matter changes extend into the brainstem.  Flow is present in the major intracranial arteries. The patient is status post bilateral lens replacements. Minimal mucosal thickening is present in the anterior ethmoid air cells and frontal sinuses. There is minimal fluid in the mastoid air cells. No obstructing nasopharyngeal lesion is evident.  IMPRESSION: 1. No acute intracranial abnormality. 2. Atrophy and white matter disease is likely within normal limits for age. 3. Minimal sinus disease.   Electronically Signed   By: Lawrence Santiago M.D.   On: 03/15/2014 16:57    Microbiology: No results found for this or any previous visit (from the past 240 hour(s)).   Labs: Basic Metabolic Panel:  Recent Labs Lab 03/15/14 1307 03/15/14 1316  NA 138  137  K 4.8 4.6  CL 100 105  CO2 25  --   GLUCOSE 186* 185*  BUN 31* 33*  CREATININE 1.19* 1.20*  CALCIUM 9.8  --    Liver Function Tests:  Recent Labs Lab 03/15/14 1307  AST 24  ALT 20  ALKPHOS 73  BILITOT 0.3  PROT 6.2  ALBUMIN 3.6   No results found for this basename: LIPASE, AMYLASE,  in the last 168 hours No results found for this basename: AMMONIA,  in the last 168 hours CBC:  Recent Labs Lab 03/15/14 1307 03/15/14 1316 03/17/14 0325  WBC 5.6  --  4.7  NEUTROABS 4.1  --   --   HGB 13.2 14.3 12.2  HCT 41.1 42.0 38.5  MCV 91.9  --  92.1  PLT 148*  --  149*   Cardiac Enzymes: No results found for this basename: CKTOTAL, CKMB, CKMBINDEX, TROPONINI,  in the last 168 hours BNP: BNP (last 3 results) No results found for this basename: PROBNP,  in the last 8760 hours CBG:  Recent Labs Lab 03/16/14 1203 03/16/14 1647 03/16/14 1946 03/17/14 0733 03/17/14 1128  GLUCAP 221* 209* 135* 163* 268*       Signed:  Velvet Bathe  [facility] 03/17/2014, 12:02 PM

## 2014-03-19 ENCOUNTER — Emergency Department (HOSPITAL_COMMUNITY): Payer: Medicare Other

## 2014-03-19 ENCOUNTER — Encounter (HOSPITAL_COMMUNITY): Payer: Self-pay | Admitting: Emergency Medicine

## 2014-03-19 ENCOUNTER — Observation Stay (HOSPITAL_COMMUNITY)
Admission: EM | Admit: 2014-03-19 | Discharge: 2014-03-24 | Disposition: A | Discharge status: Home / Self Care | Payer: Medicare Other | Attending: Internal Medicine | Admitting: Internal Medicine

## 2014-03-19 DIAGNOSIS — N189 Chronic kidney disease, unspecified: Secondary | ICD-10-CM

## 2014-03-19 DIAGNOSIS — E785 Hyperlipidemia, unspecified: Secondary | ICD-10-CM | POA: Diagnosis not present

## 2014-03-19 DIAGNOSIS — I5032 Chronic diastolic (congestive) heart failure: Secondary | ICD-10-CM

## 2014-03-19 DIAGNOSIS — Z87898 Personal history of other specified conditions: Secondary | ICD-10-CM

## 2014-03-19 DIAGNOSIS — M19049 Primary osteoarthritis, unspecified hand: Secondary | ICD-10-CM | POA: Diagnosis not present

## 2014-03-19 DIAGNOSIS — Z794 Long term (current) use of insulin: Secondary | ICD-10-CM | POA: Insufficient documentation

## 2014-03-19 DIAGNOSIS — H9191 Unspecified hearing loss, right ear: Secondary | ICD-10-CM

## 2014-03-19 DIAGNOSIS — R404 Transient alteration of awareness: Principal | ICD-10-CM | POA: Insufficient documentation

## 2014-03-19 DIAGNOSIS — Z86718 Personal history of other venous thrombosis and embolism: Secondary | ICD-10-CM | POA: Insufficient documentation

## 2014-03-19 DIAGNOSIS — I10 Essential (primary) hypertension: Secondary | ICD-10-CM

## 2014-03-19 DIAGNOSIS — E538 Deficiency of other specified B group vitamins: Secondary | ICD-10-CM | POA: Diagnosis not present

## 2014-03-19 DIAGNOSIS — I2699 Other pulmonary embolism without acute cor pulmonale: Secondary | ICD-10-CM

## 2014-03-19 DIAGNOSIS — N259 Disorder resulting from impaired renal tubular function, unspecified: Secondary | ICD-10-CM

## 2014-03-19 DIAGNOSIS — K219 Gastro-esophageal reflux disease without esophagitis: Secondary | ICD-10-CM | POA: Diagnosis present

## 2014-03-19 DIAGNOSIS — E119 Type 2 diabetes mellitus without complications: Secondary | ICD-10-CM

## 2014-03-19 DIAGNOSIS — I129 Hypertensive chronic kidney disease with stage 1 through stage 4 chronic kidney disease, or unspecified chronic kidney disease: Secondary | ICD-10-CM | POA: Insufficient documentation

## 2014-03-19 DIAGNOSIS — M810 Age-related osteoporosis without current pathological fracture: Secondary | ICD-10-CM | POA: Diagnosis not present

## 2014-03-19 DIAGNOSIS — Z85820 Personal history of malignant melanoma of skin: Secondary | ICD-10-CM | POA: Insufficient documentation

## 2014-03-19 DIAGNOSIS — I4891 Unspecified atrial fibrillation: Secondary | ICD-10-CM

## 2014-03-19 DIAGNOSIS — Z0000 Encounter for general adult medical examination without abnormal findings: Secondary | ICD-10-CM

## 2014-03-19 DIAGNOSIS — N390 Urinary tract infection, site not specified: Secondary | ICD-10-CM

## 2014-03-19 DIAGNOSIS — Z7901 Long term (current) use of anticoagulants: Secondary | ICD-10-CM | POA: Insufficient documentation

## 2014-03-19 DIAGNOSIS — G9340 Encephalopathy, unspecified: Secondary | ICD-10-CM

## 2014-03-19 DIAGNOSIS — K5909 Other constipation: Secondary | ICD-10-CM

## 2014-03-19 DIAGNOSIS — K5900 Constipation, unspecified: Secondary | ICD-10-CM

## 2014-03-19 DIAGNOSIS — Z86711 Personal history of pulmonary embolism: Secondary | ICD-10-CM | POA: Diagnosis not present

## 2014-03-19 DIAGNOSIS — F329 Major depressive disorder, single episode, unspecified: Secondary | ICD-10-CM

## 2014-03-19 DIAGNOSIS — R2990 Unspecified symptoms and signs involving the nervous system: Secondary | ICD-10-CM

## 2014-03-19 DIAGNOSIS — D509 Iron deficiency anemia, unspecified: Secondary | ICD-10-CM

## 2014-03-19 DIAGNOSIS — J309 Allergic rhinitis, unspecified: Secondary | ICD-10-CM

## 2014-03-19 DIAGNOSIS — I951 Orthostatic hypotension: Secondary | ICD-10-CM | POA: Diagnosis not present

## 2014-03-19 DIAGNOSIS — N184 Chronic kidney disease, stage 4 (severe): Secondary | ICD-10-CM | POA: Diagnosis not present

## 2014-03-19 DIAGNOSIS — R791 Abnormal coagulation profile: Secondary | ICD-10-CM

## 2014-03-19 DIAGNOSIS — T07XXXA Unspecified multiple injuries, initial encounter: Secondary | ICD-10-CM

## 2014-03-19 DIAGNOSIS — I639 Cerebral infarction, unspecified: Secondary | ICD-10-CM

## 2014-03-19 DIAGNOSIS — R109 Unspecified abdominal pain: Secondary | ICD-10-CM

## 2014-03-19 DIAGNOSIS — I509 Heart failure, unspecified: Secondary | ICD-10-CM | POA: Diagnosis not present

## 2014-03-19 DIAGNOSIS — N281 Cyst of kidney, acquired: Secondary | ICD-10-CM

## 2014-03-19 DIAGNOSIS — N179 Acute kidney failure, unspecified: Secondary | ICD-10-CM | POA: Insufficient documentation

## 2014-03-19 DIAGNOSIS — R410 Disorientation, unspecified: Secondary | ICD-10-CM

## 2014-03-19 DIAGNOSIS — M25512 Pain in left shoulder: Secondary | ICD-10-CM

## 2014-03-19 DIAGNOSIS — H918X9 Other specified hearing loss, unspecified ear: Secondary | ICD-10-CM

## 2014-03-19 DIAGNOSIS — R269 Unspecified abnormalities of gait and mobility: Secondary | ICD-10-CM

## 2014-03-19 DIAGNOSIS — E43 Unspecified severe protein-calorie malnutrition: Secondary | ICD-10-CM

## 2014-03-19 DIAGNOSIS — B0229 Other postherpetic nervous system involvement: Secondary | ICD-10-CM

## 2014-03-19 DIAGNOSIS — IMO0002 Reserved for concepts with insufficient information to code with codable children: Secondary | ICD-10-CM

## 2014-03-19 DIAGNOSIS — M79672 Pain in left foot: Secondary | ICD-10-CM

## 2014-03-19 DIAGNOSIS — Z5181 Encounter for therapeutic drug level monitoring: Secondary | ICD-10-CM

## 2014-03-19 DIAGNOSIS — F3289 Other specified depressive episodes: Secondary | ICD-10-CM

## 2014-03-19 DIAGNOSIS — R55 Syncope and collapse: Secondary | ICD-10-CM

## 2014-03-19 DIAGNOSIS — K6289 Other specified diseases of anus and rectum: Secondary | ICD-10-CM

## 2014-03-19 LAB — COMPREHENSIVE METABOLIC PANEL
ALK PHOS: 79 U/L (ref 39–117)
ALT: 15 U/L (ref 0–35)
ANION GAP: 15 (ref 5–15)
AST: 17 U/L (ref 0–37)
Albumin: 4.1 g/dL (ref 3.5–5.2)
BUN: 30 mg/dL — AB (ref 6–23)
CO2: 27 mEq/L (ref 19–32)
Calcium: 10.5 mg/dL (ref 8.4–10.5)
Chloride: 100 mEq/L (ref 96–112)
Creatinine, Ser: 1.22 mg/dL — ABNORMAL HIGH (ref 0.50–1.10)
GFR calc Af Amer: 44 mL/min — ABNORMAL LOW (ref >90)
GFR calc non Af Amer: 38 mL/min — ABNORMAL LOW (ref >90)
Glucose, Bld: 178 mg/dL — ABNORMAL HIGH (ref 70–99)
Potassium: 4.3 mEq/L (ref 3.7–5.3)
SODIUM: 142 meq/L (ref 137–147)
TOTAL PROTEIN: 6.8 g/dL (ref 6.0–8.3)
Total Bilirubin: 0.4 mg/dL (ref 0.3–1.2)

## 2014-03-19 LAB — URINALYSIS, ROUTINE W REFLEX MICROSCOPIC
Bilirubin Urine: NEGATIVE
GLUCOSE, UA: NEGATIVE mg/dL
Ketones, ur: NEGATIVE mg/dL
LEUKOCYTES UA: NEGATIVE
NITRITE: NEGATIVE
Protein, ur: 30 mg/dL — AB
Specific Gravity, Urine: 1.016 (ref 1.005–1.030)
Urobilinogen, UA: 1 mg/dL (ref 0.0–1.0)
pH: 6 (ref 5.0–8.0)

## 2014-03-19 LAB — URINE MICROSCOPIC-ADD ON

## 2014-03-19 LAB — GLUCOSE, CAPILLARY
Glucose-Capillary: 71 mg/dL (ref 70–99)
Glucose-Capillary: 226 mg/dL — ABNORMAL HIGH (ref 70–99)

## 2014-03-19 LAB — CBC
HEMATOCRIT: 43.1 % (ref 36.0–46.0)
Hemoglobin: 14.1 g/dL (ref 12.0–15.0)
MCH: 29.8 pg (ref 26.0–34.0)
MCHC: 32.7 g/dL (ref 30.0–36.0)
MCV: 91.1 fL (ref 78.0–100.0)
Platelets: 164 10*3/uL (ref 150–400)
RBC: 4.73 MIL/uL (ref 3.87–5.11)
RDW: 13.9 % (ref 11.5–15.5)
WBC: 6.3 10*3/uL (ref 4.0–10.5)

## 2014-03-19 LAB — AMMONIA: Ammonia: 10 umol/L — ABNORMAL LOW (ref 11–60)

## 2014-03-19 LAB — CBG MONITORING, ED: GLUCOSE-CAPILLARY: 154 mg/dL — AB (ref 70–99)

## 2014-03-19 LAB — PROTIME-INR
INR: 2.76 — ABNORMAL HIGH (ref 0.00–1.49)
Prothrombin Time: 29.2 seconds — ABNORMAL HIGH (ref 11.6–15.2)

## 2014-03-19 MED ORDER — CLONIDINE HCL 0.3 MG/24HR TD PTWK
0.3000 mg | MEDICATED_PATCH | TRANSDERMAL | Status: DC
  Administered 2014-03-20: 0.3 mg via TRANSDERMAL
  Filled 2014-03-19: qty 1

## 2014-03-19 MED ORDER — ACETAMINOPHEN 325 MG PO TABS
650.0000 mg | ORAL_TABLET | Freq: Four times a day (QID) | ORAL | Status: DC | PRN
  Administered 2014-03-20: 650 mg via ORAL
  Filled 2014-03-19 (×2): qty 2

## 2014-03-19 MED ORDER — INSULIN GLARGINE 100 UNIT/ML ~~LOC~~ SOLN
21.0000 [IU] | Freq: Every day | SUBCUTANEOUS | Status: DC
  Administered 2014-03-20 – 2014-03-24 (×5): 21 [IU] via SUBCUTANEOUS
  Filled 2014-03-19 (×6): qty 0.21

## 2014-03-19 MED ORDER — PANTOPRAZOLE SODIUM 40 MG PO TBEC
40.0000 mg | DELAYED_RELEASE_TABLET | Freq: Every day | ORAL | Status: DC
  Administered 2014-03-19 – 2014-03-24 (×6): 40 mg via ORAL
  Filled 2014-03-19 (×5): qty 1

## 2014-03-19 MED ORDER — FLUTICASONE PROPIONATE 50 MCG/ACT NA SUSP
2.0000 | Freq: Every day | NASAL | Status: DC
  Administered 2014-03-20 – 2014-03-24 (×5): 2 via NASAL
  Filled 2014-03-19: qty 16

## 2014-03-19 MED ORDER — LABETALOL HCL 200 MG PO TABS
400.0000 mg | ORAL_TABLET | Freq: Two times a day (BID) | ORAL | Status: DC
  Administered 2014-03-19 – 2014-03-24 (×11): 400 mg via ORAL
  Filled 2014-03-19 (×14): qty 2

## 2014-03-19 MED ORDER — SODIUM CHLORIDE 0.9 % IV SOLN
INTRAVENOUS | Status: AC
  Administered 2014-03-19: 16:00:00 via INTRAVENOUS

## 2014-03-19 MED ORDER — WARFARIN - PHYSICIAN DOSING INPATIENT
Freq: Every day | Status: DC
  Administered 2014-03-20: 1

## 2014-03-19 MED ORDER — SIMVASTATIN 5 MG PO TABS
5.0000 mg | ORAL_TABLET | Freq: Every day | ORAL | Status: DC
  Administered 2014-03-19 – 2014-03-23 (×5): 5 mg via ORAL
  Filled 2014-03-19 (×6): qty 1

## 2014-03-19 MED ORDER — RISPERIDONE 0.5 MG PO TABS
0.5000 mg | ORAL_TABLET | Freq: Every day | ORAL | Status: DC
  Administered 2014-03-19: 0.5 mg via ORAL
  Filled 2014-03-19 (×2): qty 1

## 2014-03-19 MED ORDER — POLYETHYLENE GLYCOL 3350 17 G PO PACK
17.0000 g | PACK | Freq: Two times a day (BID) | ORAL | Status: DC
  Administered 2014-03-20 – 2014-03-24 (×8): 17 g via ORAL
  Filled 2014-03-19 (×11): qty 1

## 2014-03-19 MED ORDER — ACETAMINOPHEN 650 MG RE SUPP: 650.0000 mg | Freq: Four times a day (QID) | RECTAL | Status: DC | PRN

## 2014-03-19 MED ORDER — ACETAMINOPHEN 325 MG PO TABS
650.0000 mg | ORAL_TABLET | Freq: Three times a day (TID) | ORAL | Status: DC
  Administered 2014-03-19 – 2014-03-20 (×3): 650 mg via ORAL
  Filled 2014-03-19 (×2): qty 2

## 2014-03-19 MED ORDER — WARFARIN SODIUM 3 MG PO TABS
3.0000 mg | ORAL_TABLET | ORAL | Status: AC
  Administered 2014-03-20: 3 mg via ORAL
  Filled 2014-03-19: qty 1

## 2014-03-19 NOTE — ED Notes (Signed)
RN notified [facility]G 154

## 2014-03-19 NOTE — ED Provider Notes (Signed)
CSN: [id number]     Arrival date & time 03/19/14  7673 History   First MD Initiated Contact with Patient 03/19/14 [phone]     Chief Complaint  Patient presents with  . Altered Mental Status    Level 05 caveat due to altered mental status  (Consider location/radiation/quality/duration/timing/severity/associated sxs/prior Treatment) Patient is a 80 y.o. female presenting with altered mental status. The history is provided by the patient and a relative.  Altered Mental Status  patient presents with altered mental status. He is reportedly been talking to family members and children they're not there. Baseline she is alert and oriented x3. She is somewhat confused now. She's able to follow commands identify her daughters. She is not able to tell me what has happened. No fevers.  Past Medical History  Diagnosis Date  . POSTHERPETIC NEURALGIA 11/02/2009  . B12 DEFICIENCY 05/23/2007  . HYPERLIPIDEMIA 05/23/2007  . HYPERKALEMIA 05/30/2010  . ANEMIA-IRON DEFICIENCY 01/26/2007  . BLEPHARITIS, BILATERAL 02/23/2008  . Other specified forms of hearing loss 05/30/2010  . HYPERTENSION 01/26/2007  . Atrial fibrillation 05/23/2007  . DIASTOLIC HEART FAILURE, CHRONIC 12/14/2009  . POSTURAL HYPOTENSION 02/07/2008  . ALLERGIC RHINITIS 11/02/2009  . GERD 01/26/2007  . CONSTIPATION 01/25/2008  . SKIN LESION 05/30/2010  . BACK PAIN 08/02/2007  . OSTEOPOROSIS 05/23/2007  . SYNCOPE 02/07/2008  . FATIGUE 09/06/2007  . Dysuria 09/05/2008  . Abdominal pain, epigastric 09/06/2007  . EPIGASTRIC TENDERNESS 10/12/2007  . PULMONARY EMBOLISM, HX OF 05/23/2007  . SHINGLES, HX OF 05/23/2007  . 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 Left foot pain   . Diabetic neuropathy   . RENAL INSUFFICIENCY 07/02/2009  . RENAL CYST, LEFT 09/06/2007  . Kidney stones     "never had OR"  . Family history of anesthesia complication     "daughter has PONV; another daughter a little anesthesia lasts way too long"  . CHF (congestive heart failure) 2011  .  Myocardial infarction 2011    "mild"  . DVT (deep venous thrombosis) ~ 1964    "think it was in the left"  . Aspiration pneumonia ~ 2006    "due to aspiration"  . DIABETES MELLITUS, TYPE II 09/06/2007  . DEPRESSIVE DISORDER 01/25/2008    "@ times; never treated for it"  . Melanoma of nose   . Compression fracture of lumbar vertebra     hx  . Compression fracture of thoracic vertebra 02/07/2014    "fell this am" (02/07/2014  . Arthritis     "hands" (03/15/2014)   Past Surgical History  Procedure Laterality Date  . Appendectomy  1943  . Abdominal hysterectomy  1964  . Cardiac electrophysiology mapping and ablation  1999  . Cataract extraction w/ intraocular lens  implant, bilateral  2003-2005  . Carpal tunnel release Right 2004  . Mohs surgery  2011    "removed off her nose"  . Cardiac catheterization  10/2009  . Esophageal dilation  X 2   Family History  Problem Relation Age of Onset  . Breast cancer Mother   . Colon cancer Father   . Stroke Father     died with stroke postop  . Diabetes Sister     2 sisters  . Hypertension Other    History  Substance Use Topics  . Smoking status: Never Smoker   . Smokeless tobacco: Never Used  . Alcohol Use: No   OB History   Grav Para Term Preterm Abortions TAB SAB Ect Mult Living  Review of Systems  Unable to perform ROS     Allergies  Amiodarone; Deltasone; Diazepam; Diltiazem hcl; Levofloxacin; Pregabalin; Spironolactone; and Tequin  Home Medications   Prior to Admission medications   Medication Sig Start Date End Date Taking? Authorizing Provider  acetaminophen (TYLENOL) 325 MG tablet Take 2 tablets (650 mg total) by mouth 3 (three) times daily. 02/09/14   Delfina Redwood, MD  cloNIDine (CATAPRES - DOSED IN MG/24 HR) 0.3 mg/24hr patch Place 0.3 mg onto the skin once a week.    Historical Provider, MD  CRANBERRY PO Take 1 capsule by mouth daily.     Historical Provider, MD  Cyanocobalamin (VITAMIN B-12 IJ)  Inject as directed every 30 (thirty) days.    Historical Provider, MD  ferrous sulfate 325 (65 FE) MG tablet Take 1 tablet (325 mg total) by mouth 2 (two) times daily with a meal. 02/15/14   Ivan Anchors Love, PA-C  fexofenadine (ALLEGRA) 180 MG tablet Take 180 mg by mouth daily.     Historical Provider, MD  fluticasone (FLONASE) 50 MCG/ACT nasal spray Place 2 sprays into the nose daily. 03/22/13   Biagio Borg, MD  glucose blood (ONE TOUCH ULTRA TEST) test strip Use as instructed before meals and bedtime (qid) 250.02  Due to fluctuating blood sugars 02/23/14   Biagio Borg, MD  hydrochlorothiazide (HYDRODIURIL) 25 MG tablet Take 1 tablet (25 mg total) by mouth daily. 03/17/14   Velvet Bathe, MD  Hypromellose (ARTIFICIAL TEARS OP) Place 1 drop into both eyes daily.    Historical Provider, MD  insulin glargine (LANTUS) 100 UNIT/ML injection Inject 21 Units into the skin daily.    Historical Provider, MD  labetalol (NORMODYNE) 200 MG tablet Take 400 mg by mouth 2 (two) times daily.    Historical Provider, MD  pantoprazole (PROTONIX) 40 MG tablet Take 1 tablet (40 mg total) by mouth daily. 07/20/13   Biagio Borg, MD  polyethylene glycol [facility] / Floria Raveling) packet Take 17 g by mouth 2 (two) times daily. 02/15/14   Ivan Anchors Love, PA-C  pravastatin (PRAVACHOL) 40 MG tablet Take 20 mg by mouth daily. 1/2 daily 07/20/13   Biagio Borg, MD  warfarin (COUMADIN) 2 MG tablet Take 3 mg by mouth daily. 3mg  every day except on Sunday. Sunday do not take any.    Historical Provider, MD   BP 212/98  Pulse 85  Temp(Src) 97.8 F (36.6 C) (Oral)  Resp 16  Wt 119 lb 14 oz (54.375 kg)  SpO2 99% Physical Exam  Constitutional: She appears well-developed and well-nourished.  HENT:  Head: Normocephalic.  Eyes: Pupils are equal, round, and reactive to light.  Cardiovascular: Normal rate and regular rhythm.   Pulmonary/Chest: Effort normal and breath sounds normal.  Abdominal: Soft. There is no tenderness.   Neurological: She is alert.  Somewhat confused. Moving all extremities equally.  Skin: Skin is warm.    ED Course  Procedures (including critical care time) Labs Review Labs Reviewed  COMPREHENSIVE METABOLIC PANEL - Abnormal; Notable for the following:    Glucose, Bld 178 (*)    BUN 30 (*)    Creatinine, Ser 1.22 (*)    GFR calc non Af Amer 38 (*)    GFR calc Af Amer 44 (*)    All other components within normal limits  URINALYSIS, ROUTINE W REFLEX MICROSCOPIC - Abnormal; Notable for the following:    Hgb urine dipstick SMALL (*)    Protein, ur 30 (*)  All other components within normal limits  PROTIME-INR - Abnormal; Notable for the following:    Prothrombin Time 29.2 (*)    INR 2.76 (*)    All other components within normal limits  URINE MICROSCOPIC-ADD ON - Abnormal; Notable for the following:    Casts HYALINE CASTS (*)    All other components within normal limits  CBG MONITORING, ED - Abnormal; Notable for the following:    Glucose-Capillary 154 (*)    All other components within normal limits  CBC  GLUCOSE, CAPILLARY  FOLATE RBC  AMMONIA    Imaging Review Dg Chest 2 View  03/19/2014   CLINICAL DATA:  Altered mental status  EXAM: CHEST  2 VIEW  COMPARISON:  02/07/2014  FINDINGS: Cardiac silhouette is mildly enlarged. No mediastinal or hilar masses. No lung consolidation or edema. No pleural effusion or pneumothorax. Lungs are hyperexpanded.  The bony thorax is demineralized, but stable.  IMPRESSION: No acute cardiopulmonary disease.   Electronically Signed   By: Lajean Manes M.D.   On: 03/19/2014 10:52   Ct Head Wo Contrast  03/19/2014   CLINICAL DATA:  Confusion.  Hallucination.  EXAM: CT HEAD WITHOUT CONTRAST  TECHNIQUE: Contiguous axial images were obtained from the base of the skull through the vertex without intravenous contrast.  COMPARISON:  MRI 03/15/2014.  Head CT 03/15/2014.  FINDINGS: The brain shows generalized atrophy. There is no sign of old or acute  focal infarction, mass lesion, hemorrhage, hydrocephalus or extra-axial collection. The calvarium is unremarkable. The sinuses are clear. There is atherosclerotic calcification of the major vessels at the base of the brain.  IMPRESSION: No acute finding.  Chronic atrophy as seen on the previous studies.   Electronically Signed   By: Nelson Chimes M.D.   On: 03/19/2014 10:32     EKG Interpretation   Date/Time:  Sunday March 19 2014 09:57:45 EDT Ventricular Rate:  93 PR Interval:  152 QRS Duration: 110 QT Interval:  395 QTC Calculation: 491 R Axis:   -77 Text Interpretation:  Sinus rhythm Consider right atrial enlargement  Incomplete RBBB and LAFB Abnormal R-wave progression, late transition LVH  with secondary repolarization abnormality Borderline prolonged QT interval  No significant change since last tracing Confirmed by Alvino Chapel  MD,  Ovid Curd [phone]) on 03/19/2014 10:23:28 AM      MDM   Final diagnoses:  Encephalopathy    Patient with altered mental status. Head CT reassuring. No clear sign of infection. Does have hypertension, however has only come on during her stay, likely because she did not get her morning medications. Will admit to internal medicine    Jasper Riling. Alvino Chapel, MD 03/19/14 1506

## 2014-03-19 NOTE — ED Notes (Signed)
Heart healthy tray ordered with Neoma Laming at service response.

## 2014-03-19 NOTE — H&P (Signed)
[facility] History and Physical  Lindsay Sheppard YDX:[id number] DOB: [birth year] DOA: 03/19/2014  Referring physician: Alvino Chapel PCP: [name], MD   Chief Complaint: confusion  HPI: Lindsay Sheppard is a 80 y.o. female  With several recent hospitalizations, most recently for workup of slurred speech. Discharged 3 days ago, at which time speech had normalized and workup negative for stroke. Started on HCTZ at discharge for uncontrolled HTN. Did well for several days, then began having visual hallucinations last night, seeing children, ants. Disoriented and did not sleep last night. No other new symptoms. TSH, B12 unremarkable last month. In ED today, workup including UA, CXR, CT brain unremarkable.  Blood pressure initially normal, but now high. Has not taken any medication today except lantus.  Patient has no complaints.     Review of Systems:  Unable due to patient factors  Past Medical History  Diagnosis Date  . POSTHERPETIC NEURALGIA 11/02/2009  . B12 DEFICIENCY 05/23/2007  . HYPERLIPIDEMIA 05/23/2007  . HYPERKALEMIA 05/30/2010  . ANEMIA-IRON DEFICIENCY 01/26/2007  . BLEPHARITIS, BILATERAL 02/23/2008  . Other specified forms of hearing loss 05/30/2010  . HYPERTENSION 01/26/2007  . Atrial fibrillation 05/23/2007  . DIASTOLIC HEART FAILURE, CHRONIC 12/14/2009  . POSTURAL HYPOTENSION 02/07/2008  . ALLERGIC RHINITIS 11/02/2009  . GERD 01/26/2007  . CONSTIPATION 01/25/2008  . SKIN LESION 05/30/2010  . BACK PAIN 08/02/2007  . OSTEOPOROSIS 05/23/2007  . SYNCOPE 02/07/2008  . FATIGUE 09/06/2007  . Dysuria 09/05/2008  . Abdominal pain, epigastric 09/06/2007  . EPIGASTRIC TENDERNESS 10/12/2007  . PULMONARY EMBOLISM, HX OF 05/23/2007  . SHINGLES, HX OF 05/23/2007  . 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 Left foot pain   . Diabetic neuropathy   . RENAL INSUFFICIENCY 07/02/2009  . RENAL CYST, LEFT 09/06/2007  . Kidney stones     "never had OR"  . Family history of anesthesia complication     "daughter has  PONV; another daughter a little anesthesia lasts way too long"  . CHF (congestive heart failure) 2011  . Myocardial infarction 2011    "mild"  . DVT (deep venous thrombosis) ~ 1964    "think it was in the left"  . Aspiration pneumonia ~ 2006    "due to aspiration"  . DIABETES MELLITUS, TYPE II 09/06/2007  . DEPRESSIVE DISORDER 01/25/2008    "@ times; never treated for it"  . Melanoma of nose   . Compression fracture of lumbar vertebra     hx  . Compression fracture of thoracic vertebra 02/07/2014    "fell this am" (02/07/2014  . Arthritis     "hands" (03/15/2014)   Past Surgical History  Procedure Laterality Date  . Appendectomy  1943  . Abdominal hysterectomy  1964  . Cardiac electrophysiology mapping and ablation  1999  . Cataract extraction w/ intraocular lens  implant, bilateral  2003-2005  . Carpal tunnel release Right 2004  . Mohs surgery  2011    "removed off her nose"  . Cardiac catheterization  10/2009  . Esophageal dilation  X 2   Social History:  reports that she has never smoked. She has never used smokeless tobacco. She reports that she does not drink alcohol or use illicit drugs.  Allergies  Allergen Reactions  . Amiodarone     REACTION: f? fluid retention  . Deltasone [Prednisone] Other (See Comments)    unknown  . Diazepam     REACTION: agitation  . Diltiazem Hcl Other (See Comments)    unknown  . Levofloxacin  REACTION: nausea  . Pregabalin     REACTION: hallucinations  . Spironolactone     REACTION: elevated K at lowest dose  . Tequin [Gatifloxacin] Other (See Comments)    Hypoglycemia    Family History  Problem Relation Age of Onset  . Breast cancer Mother   . Colon cancer Father   . Stroke Father     died with stroke postop  . Diabetes Sister     2 sisters  . Hypertension Other      Prior to Admission medications   Medication Sig Start Date End Date Taking? Authorizing Provider  acetaminophen (TYLENOL) 325 MG tablet Take 2 tablets (650  mg total) by mouth 3 (three) times daily. 02/09/14   Delfina Redwood, MD  cloNIDine (CATAPRES - DOSED IN MG/24 HR) 0.3 mg/24hr patch Place 0.3 mg onto the skin once a week.    Historical Provider, MD  CRANBERRY PO Take 1 capsule by mouth daily.     Historical Provider, MD  Cyanocobalamin (VITAMIN B-12 IJ) Inject as directed every 30 (thirty) days.    Historical Provider, MD  ferrous sulfate 325 (65 FE) MG tablet Take 1 tablet (325 mg total) by mouth 2 (two) times daily with a meal. 02/15/14   Ivan Anchors Love, PA-C  fexofenadine (ALLEGRA) 180 MG tablet Take 180 mg by mouth daily.     Historical Provider, MD  fluticasone (FLONASE) 50 MCG/ACT nasal spray Place 2 sprays into the nose daily. 03/22/13   Biagio Borg, MD  glucose blood (ONE TOUCH ULTRA TEST) test strip Use as instructed before meals and bedtime (qid) 250.02  Due to fluctuating blood sugars 02/23/14   Biagio Borg, MD  hydrochlorothiazide (HYDRODIURIL) 25 MG tablet Take 1 tablet (25 mg total) by mouth daily. 03/17/14   Velvet Bathe, MD  Hypromellose (ARTIFICIAL TEARS OP) Place 1 drop into both eyes daily.    Historical Provider, MD  insulin glargine (LANTUS) 100 UNIT/ML injection Inject 21 Units into the skin daily.    Historical Provider, MD  labetalol (NORMODYNE) 200 MG tablet Take 400 mg by mouth 2 (two) times daily.    Historical Provider, MD  pantoprazole (PROTONIX) 40 MG tablet Take 1 tablet (40 mg total) by mouth daily. 07/20/13   Biagio Borg, MD  polyethylene glycol [facility] / Floria Raveling) packet Take 17 g by mouth 2 (two) times daily. 02/15/14   Ivan Anchors Love, PA-C  pravastatin (PRAVACHOL) 40 MG tablet Take 20 mg by mouth daily. 1/2 daily 07/20/13   Biagio Borg, MD  warfarin (COUMADIN) 2 MG tablet Take 3 mg by mouth daily. 3mg  every day except on Sunday. Sunday do not take any.    Historical Provider, MD   Physical Exam: Filed Vitals:   03/19/14 1145 03/19/14 1200 03/19/14 1215 03/19/14 1245  BP: 187/81 191/101 192/92 191/90    Pulse: 86 84 85 83  Temp:      TempSrc:      Resp: 17 21 15 17   Weight:      SpO2: 100% 100% 100% 100%    Wt Readings from Last 3 Encounters:  03/19/14 54.375 kg (119 lb 14 oz)  03/15/14 54.4 kg (119 lb 14.9 oz)  02/23/14 55.339 kg (122 lb)    BP 212/98  Pulse 85  Temp(Src) 97.8 F (36.6 C) (Oral)  Resp 16  Wt 54.375 kg (119 lb 14 oz)  SpO2 99%  General Appearance:    Alert, cooperative, disoriented to place and time  Head:    Normocephalic, without obvious abnormality, atraumatic  Eyes:    PERRL, conjunctiva/corneas clear, EOM's intact, fundi    benign, both eyes     Nose:   Nares normal, septum midline, mucosa normal, no drainage    or sinus tenderness  Throat:   Slightly dry mucous membranes. OP without erythema or exudate  Neck:   Supple, symmetrical, trachea midline, no adenopathy;    thyroid:  no enlargement/tenderness/nodules; no carotid   bruit or JVD  Back:     Symmetric, no curvature, ROM normal, no CVA tenderness  Lungs:     Clear to auscultation bilaterally, respirations unlabored  Chest Wall:    No tenderness or deformity   Heart:    Regular rate and rhythm, S1 and S2 normal, no murmur, rub   or gallop     Abdomen:     Soft, non-tender, bowel sounds active all four quadrants,    no masses, no organomegaly  Genitalia:    deferred  Rectal:   deferred  Extremities:   Extremities normal, atraumatic, no cyanosis or edema  Pulses:   2+ and symmetric all extremities  Skin:   Skin color, texture, turgor normal, no rashes or lesions  Lymph nodes:   Cervical, supraclavicular, and axillary nodes normal  Neurologic:   CNII-XII intact, normal strength, sensation and reflexes    throughout             Psychiatric: calm and cooperative with out any evidence of hallucinations  Labs on Admission:  Basic Metabolic Panel:  Recent Labs Lab 03/15/14 1307 03/15/14 1316 03/19/14 1003  NA 138 137 142  K 4.8 4.6 4.3  CL 100 105 100  CO2 25  --  27  GLUCOSE 186*  185* 178*  BUN 31* 33* 30*  CREATININE 1.19* 1.20* 1.22*  CALCIUM 9.8  --  10.5   Liver Function Tests:  Recent Labs Lab 03/15/14 1307 03/19/14 1003  AST 24 17  ALT 20 15  ALKPHOS 73 79  BILITOT 0.3 0.4  PROT 6.2 6.8  ALBUMIN 3.6 4.1   No results found for this basename: LIPASE, AMYLASE,  in the last 168 hours No results found for this basename: AMMONIA,  in the last 168 hours CBC:  Recent Labs Lab 03/15/14 1307 03/15/14 1316 03/17/14 0325 03/19/14 1003  WBC 5.6  --  4.7 6.3  NEUTROABS 4.1  --   --   --   HGB 13.2 14.3 12.2 14.1  HCT 41.1 42.0 38.5 43.1  MCV 91.9  --  92.1 91.1  PLT 148*  --  149* 164   Cardiac Enzymes: No results found for this basename: CKTOTAL, CKMB, CKMBINDEX, TROPONINI,  in the last 168 hours  BNP (last 3 results) No results found for this basename: PROBNP,  in the last 8760 hours CBG:  Recent Labs Lab 03/16/14 1946 03/17/14 0733 03/17/14 1128 03/17/14 1704 03/19/14 1106  GLUCAP 135* 163* 268* 218* 154*    Radiological Exams on Admission: Dg Chest 2 View  03/19/2014   CLINICAL DATA:  Altered mental status  EXAM: CHEST  2 VIEW  COMPARISON:  02/07/2014  FINDINGS: Cardiac silhouette is mildly enlarged. No mediastinal or hilar masses. No lung consolidation or edema. No pleural effusion or pneumothorax. Lungs are hyperexpanded.  The bony thorax is demineralized, but stable.  IMPRESSION: No acute cardiopulmonary disease.   Electronically Signed   By: Lajean Manes M.D.   On: 03/19/2014 10:52   Ct Head Wo Contrast  03/19/2014   CLINICAL DATA:  Confusion.  Hallucination.  EXAM: CT HEAD WITHOUT CONTRAST  TECHNIQUE: Contiguous axial images were obtained from the base of the skull through the vertex without intravenous contrast.  COMPARISON:  MRI 03/15/2014.  Head CT 03/15/2014.  FINDINGS: The brain shows generalized atrophy. There is no sign of old or acute focal infarction, mass lesion, hemorrhage, hydrocephalus or extra-axial collection. The  calvarium is unremarkable. The sinuses are clear. There is atherosclerotic calcification of the major vessels at the base of the brain.  IMPRESSION: No acute finding.  Chronic atrophy as seen on the previous studies.   Electronically Signed   By: Nelson Chimes M.D.   On: 03/19/2014 10:32   EKG Sinus rhythm Consider right atrial enlargement Incomplete RBBB and LAFB Abnormal R-wave progression, late transition  Assessment/Plan Principal Problem:   Acute delirium: etiology not clear. May be sundowning. Will check folate, NH3. Recent normal B12, TSH, and w/u in ED unremarkable. Observation. Small dose risperdal tonight. 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 Will give dose now. Appears slightly dehydrated on exam. Hold HCTZ, and give 1 liter saline over 10 hours  Code Status: full Family Communication: daughters at bedside Disposition Plan: obs  Time spent: 50 min  [facility] Pager [phone]

## 2014-03-19 NOTE — ED Notes (Addendum)
Family reports that pt has been confused onset last night. Family reports that pt is hallucinating, seeing things that are not there or not realizing that she was home. Pt had history of same when she was on lyrica. Family reports that pt has had dark colored urine with strong odor.

## 2014-03-20 ENCOUNTER — Observation Stay (HOSPITAL_COMMUNITY): Payer: Medicare Other

## 2014-03-20 DIAGNOSIS — R404 Transient alteration of awareness: Secondary | ICD-10-CM | POA: Diagnosis not present

## 2014-03-20 DIAGNOSIS — G9340 Encephalopathy, unspecified: Secondary | ICD-10-CM

## 2014-03-20 LAB — GLUCOSE, CAPILLARY
GLUCOSE-CAPILLARY: 129 mg/dL — AB (ref 70–99)
GLUCOSE-CAPILLARY: 269 mg/dL — AB (ref 70–99)
Glucose-Capillary: 210 mg/dL — ABNORMAL HIGH (ref 70–99)
Glucose-Capillary: 168 mg/dL — ABNORMAL HIGH (ref 70–99)

## 2014-03-20 LAB — VITAMIN B12: VITAMIN B 12: 887 pg/mL (ref 211–911)

## 2014-03-20 LAB — PROTIME-INR
INR: 2.98 — AB (ref 0.00–1.49)
PROTHROMBIN TIME: 31 s — AB (ref 11.6–15.2)

## 2014-03-20 LAB — TSH: TSH: 1.93 u[IU]/mL (ref 0.350–4.500)

## 2014-03-20 LAB — FOLATE RBC: RBC Folate: 891 ng/mL — ABNORMAL HIGH (ref >280)

## 2014-03-20 MED ORDER — HYDRALAZINE HCL 50 MG PO TABS
50.0000 mg | ORAL_TABLET | Freq: Three times a day (TID) | ORAL | Status: DC
  Administered 2014-03-20 – 2014-03-23 (×8): 50 mg via ORAL
  Filled 2014-03-20 (×13): qty 1

## 2014-03-20 MED ORDER — AMLODIPINE BESYLATE 10 MG PO TABS
10.0000 mg | ORAL_TABLET | Freq: Every day | ORAL | Status: DC
  Administered 2014-03-20 – 2014-03-21 (×2): 10 mg via ORAL
  Filled 2014-03-20 (×4): qty 1

## 2014-03-20 MED ORDER — HYDRALAZINE HCL 20 MG/ML IJ SOLN: 10.0000 mg | Freq: Four times a day (QID) | INTRAMUSCULAR | Status: DC | PRN

## 2014-03-20 NOTE — Progress Notes (Signed)
Utilization review completed.  

## 2014-03-20 NOTE — Procedures (Signed)
ELECTROENCEPHALOGRAM REPORT  Date of Study: 03/20/2014  Patient's Name: Lindsay Sheppard MRN: [id number] Date of Birth: Feb 04, 1921  Referring Provider: Dr. Lala Lund  Clinical History: This is a 80 year old woman recently admitted for his third speech, on discharge patient normalized. She returns for visual hallucinations.  Medications: Norvasc, clonidine, labetalol, Risperdal, Zocor, Coumadin  Technical Summary: A multichannel digital EEG recording measured by the international 10-20 system with electrodes applied with paste and impedances below 5000 ohms performed as portable with EKG monitoring in an awake and drowsy patient.  Hyperventilation was not performed. Photic stimulation was performed.  The digital EEG was referentially recorded, reformatted, and digitally filtered in a variety of bipolar and referential montages for optimal display.   Description: The patient is awake and drowsy during the recording. She is noted to be confused during the study.  During maximal wakefulness, there is a symmetric, medium voltage 8 Hz posterior dominant rhythm that attenuates with eye opening. This is admixed with a small amount of diffuse 4-5 Hz theta slowing of the waking background.  During drowsiness and sleep, there is an increase in theta slowing seen. Photic stimulation did not elicit any abnormalities.  There were no epileptiform discharges or electrographic seizures seen.    EKG lead was unremarkable.  Impression: This awake and drowsy EEG is abnormal due to mild diffuse slowing of the waking background.  Clinical Correlation of the above findings indicates diffuse cerebral dysfunction that is non-specific in etiology and can be seen with hypoxic/ischemic injury, toxic/metabolic encephalopathies, neurodegenerative disorders, or medication effect.  The absence of epileptiform discharges does not rule out a clinical diagnosis of epilepsy.  Clinical correlation is advised.   Ellouise Newer, M.D.

## 2014-03-20 NOTE — Progress Notes (Signed)
EEG completed; results pending.    

## 2014-03-20 NOTE — Progress Notes (Signed)
Patient Demographics  Lindsay Sheppard, is a 80 y.o. female, DOB - 18-Dec-1920, PJK:[id number]  Admit date - 03/19/2014   Admitting Physician Delfina Redwood, MD  Outpatient Primary MD for the patient is [name], MD  LOS - 1   Chief Complaint  Patient presents with  . Altered Mental Status        Subjective:   [name] today has, No headache, No chest pain, No abdominal pain - No Nausea, No new weakness tingling or numbness, No Cough - SOB.    Assessment & Plan    1. Acute delirium - etiology unclear, question hypertensive encephalopathy. She was recently admitted 3-4 days ago for a stroke workup which was unremarkable, question if she had TIA then and now has a small infarct. Head CT unremarkable, she denies headache or fevers, no neck stiffness on exam. No focal deficits on exam which is limited but still looks stable. Chest x-ray, UA and electrolytes appear stable as well, will repeat MRI of the brain to rule out a small infarct, checked EEG. Hold Risperdal.   2. Essential hypertension - blood pressure is out of control, continue labetalol, Catapres at home dose, add Norvasc and scheduled hydralazine along with IV hydralazine as needed.   3. History of diabetes mellitus type 2. Continue Lantus and sliding scale, noted A1c  CBG (last 3)   Recent Labs  03/19/14 1416 03/19/14 2228 03/20/14 0709  GLUCAP 71 226* 129*     Lab Results  Component Value Date   HGBA1C 7.2* 03/16/2014     4. Atrial fibrillation. On labetalol and Coumadin. Pharmacy monitoring INR.    5. History of low vitamin B12 continue supplementation check a level.    6. Dyslipidemia. Continue home dose statin.  Lab Results  Component Value Date   CHOL 111 03/16/2014   HDL 36* 03/16/2014   LDLCALC 54  03/16/2014   TRIG 107 03/16/2014   CHOLHDL 3.1 03/16/2014     7. GERD. Continue PPI.      Code Status: Full  Family Communication: daughter bedside  Disposition Plan: TBD   Procedures CT Head, MRI Brain, EEG   Consults     Medications  Scheduled Meds: . amLODipine  10 mg Oral Daily  . cloNIDine  0.3 mg Transdermal Q Mon  . fluticasone  2 spray Each Nare Daily  . insulin glargine  21 Units Subcutaneous Daily  . labetalol  400 mg Oral BID  . pantoprazole  40 mg Oral Daily  . polyethylene glycol  17 g Oral BID  . risperiDONE  0.5 mg Oral QHS  . simvastatin  5 mg Oral q1800  . warfarin  3 mg Oral Once per day on Mon Tue Wed Thu Fri Sat  . Warfarin - Physician Dosing Inpatient   Does not apply q1800   Continuous Infusions:  PRN Meds:.acetaminophen, acetaminophen, hydrALAZINE  DVT Prophylaxis  Coumadin  Lab Results  Component Value Date   INR 2.98* 03/20/2014   INR 2.76* 03/19/2014   INR 2.77* 03/17/2014   PROTIME 19.0 02/16/2009   PROTIME 14.7 02/09/2009   PROTIME 16.6 01/26/2009      Lab Results  Component Value Date   PLT 164 03/19/2014    Antibiotics  Anti-infectives   None          Objective:   Filed Vitals:   03/19/14 1559 03/19/14 2037 03/20/14 0614 03/20/14 0733  BP: 127/64 166/72 221/125 160/91  Pulse: 83 80 103 98  Temp:  97.4 F (36.3 C) 97.7 F (36.5 C)   TempSrc:  Oral Oral   Resp:  16 20   Weight:      SpO2:  99% 100%     Wt Readings from Last 3 Encounters:  03/19/14 54.375 kg (119 lb 14 oz)  03/15/14 54.4 kg (119 lb 14.9 oz)  02/23/14 55.339 kg (122 lb)     Intake/Output Summary (Last 24 hours) at 03/20/14 1212 Last data filed at 03/20/14 0900  Gross per 24 hour  Intake   2100 ml  Output      0 ml  Net   2100 ml     Physical Exam  Awake , pleasantly confused, No new F.N deficits followed basic commands, Normal affect Lavina.AT,PERRAL Supple Neck,No JVD, No cervical lymphadenopathy appriciated.  Symmetrical Chest  wall movement, Good air movement bilaterally, CTAB RRR,No Gallops,Rubs or new Murmurs, No Parasternal Heave +ve B.Sounds, Abd Soft, No tenderness, No organomegaly appriciated, No rebound - guarding or rigidity. No Cyanosis, Clubbing or edema, No new Rash or bruise     Data Review   Micro Results No results found for this or any previous visit (from the past 240 hour(s)).  Radiology Reports Dg Chest 2 View  03/19/2014   CLINICAL DATA:  Altered mental status  EXAM: CHEST  2 VIEW  COMPARISON:  02/07/2014  FINDINGS: Cardiac silhouette is mildly enlarged. No mediastinal or hilar masses. No lung consolidation or edema. No pleural effusion or pneumothorax. Lungs are hyperexpanded.  The bony thorax is demineralized, but stable.  IMPRESSION: No acute cardiopulmonary disease.   Electronically Signed   By: Lajean Manes M.D.   On: 03/19/2014 10:52   Ct Head Wo Contrast  03/19/2014   CLINICAL DATA:  Confusion.  Hallucination.  EXAM: CT HEAD WITHOUT CONTRAST  TECHNIQUE: Contiguous axial images were obtained from the base of the skull through the vertex without intravenous contrast.  COMPARISON:  MRI 03/15/2014.  Head CT 03/15/2014.  FINDINGS: The brain shows generalized atrophy. There is no sign of old or acute focal infarction, mass lesion, hemorrhage, hydrocephalus or extra-axial collection. The calvarium is unremarkable. The sinuses are clear. There is atherosclerotic calcification of the major vessels at the base of the brain.  IMPRESSION: No acute finding.  Chronic atrophy as seen on the previous studies.   Electronically Signed   By: Nelson Chimes M.D.   On: 03/19/2014 10:32      Mr Brain Wo Contrast  03/15/2014   CLINICAL DATA:  Episode of slurred speech.  Evaluate for CVA.  EXAM: MRI HEAD WITHOUT CONTRAST  TECHNIQUE: Multiplanar, multiecho pulse sequences of the brain and surrounding structures were obtained without intravenous contrast.  COMPARISON:  CT head without contrast 03/15/2014. MRI brain  3/17/ 0 6  FINDINGS: The diffusion-weighted images demonstrate no evidence for acute or subacute infarct. Moderate generalized atrophy is present. Minimal white matter disease is within normal limits for age. No hemorrhage or mass lesion is present. Mild white matter changes extend into the brainstem.  Flow is present in the major intracranial arteries. The patient is status post bilateral lens replacements. Minimal mucosal thickening is present in the anterior ethmoid air cells and frontal sinuses. There is minimal fluid in the mastoid air cells. No  obstructing nasopharyngeal lesion is evident.  IMPRESSION: 1. No acute intracranial abnormality. 2. Atrophy and white matter disease is likely within normal limits for age. 3. Minimal sinus disease.   Electronically Signed   By: Lawrence Santiago M.D.   On: 03/15/2014 16:57    CBC  Recent Labs Lab 03/15/14 1307 03/15/14 1316 03/17/14 0325 03/19/14 1003  WBC 5.6  --  4.7 6.3  HGB 13.2 14.3 12.2 14.1  HCT 41.1 42.0 38.5 43.1  PLT 148*  --  149* 164  MCV 91.9  --  92.1 91.1  MCH 29.5  --  29.2 29.8  MCHC 32.1  --  31.7 32.7  RDW 14.4  --  14.2 13.9  LYMPHSABS 0.9  --   --   --   MONOABS 0.5  --   --   --   EOSABS 0.1  --   --   --   BASOSABS 0.0  --   --   --     Chemistries   Recent Labs Lab 03/15/14 1307 03/15/14 1316 03/19/14 1003  NA 138 137 142  K 4.8 4.6 4.3  CL 100 105 100  CO2 25  --  27  GLUCOSE 186* 185* 178*  BUN 31* 33* 30*  CREATININE 1.19* 1.20* 1.22*  CALCIUM 9.8  --  10.5  AST 24  --  17  ALT 20  --  15  ALKPHOS 73  --  79  BILITOT 0.3  --  0.4   ------------------------------------------------------------------------------------------------------------------ CrCl is unknown because both a height and weight (above a minimum accepted value) are required for this calculation. ------------------------------------------------------------------------------------------------------------------ No results found for this  basename: HGBA1C,  in the last 72 hours ------------------------------------------------------------------------------------------------------------------ No results found for this basename: CHOL, HDL, LDLCALC, TRIG, CHOLHDL, LDLDIRECT,  in the last 72 hours ------------------------------------------------------------------------------------------------------------------ No results found for this basename: TSH, T4TOTAL, FREET3, T3FREE, THYROIDAB,  in the last 72 hours ------------------------------------------------------------------------------------------------------------------ No results found for this basename: VITAMINB12, FOLATE, FERRITIN, TIBC, IRON, RETICCTPCT,  in the last 72 hours  Coagulation profile  Recent Labs Lab 03/15/14 1307 03/16/14 0610 03/17/14 0325 03/19/14 1003 03/20/14 0530  INR 2.69* 2.82* 2.77* 2.76* 2.98*    No results found for this basename: DDIMER,  in the last 72 hours  Cardiac Enzymes No results found for this basename: CK, CKMB, TROPONINI, MYOGLOBIN,  in the last 168 hours ------------------------------------------------------------------------------------------------------------------ No components found with this basename: POCBNP,      Time Spent in minutes   35   Kamonte Mcmichen K M.D on 03/20/2014 at 12:12 PM  Between 7am to 7pm - Pager - [phone]  After 7pm go to [email or web address] - password [email or web address]  And look for the night coverage person covering for me after hours  [facility] Office  [phone]   **Disclaimer: This note may have been dictated with voice recognition software. Similar sounding words can inadvertently be transcribed and this note may contain transcription errors which may not have been corrected upon publication of note.**

## 2014-03-21 ENCOUNTER — Observation Stay (HOSPITAL_COMMUNITY): Payer: Medicare Other

## 2014-03-21 DIAGNOSIS — R404 Transient alteration of awareness: Secondary | ICD-10-CM | POA: Diagnosis not present

## 2014-03-21 LAB — GLUCOSE, CAPILLARY
GLUCOSE-CAPILLARY: 139 mg/dL — AB (ref 70–99)
GLUCOSE-CAPILLARY: 253 mg/dL — AB (ref 70–99)
GLUCOSE-CAPILLARY: 172 mg/dL — AB (ref 70–99)
Glucose-Capillary: 182 mg/dL — ABNORMAL HIGH (ref 70–99)

## 2014-03-21 LAB — PROTIME-INR
INR: 2.57 — ABNORMAL HIGH (ref 0.00–1.49)
Prothrombin Time: 27.6 seconds — ABNORMAL HIGH (ref 11.6–15.2)

## 2014-03-21 MED ORDER — WARFARIN SODIUM 3 MG PO TABS
3.0000 mg | ORAL_TABLET | ORAL | Status: DC
  Administered 2014-03-21 – 2014-03-23 (×3): 3 mg via ORAL
  Filled 2014-03-21 (×4): qty 1

## 2014-03-21 MED ORDER — LORAZEPAM 2 MG/ML IJ SOLN: 1.0000 mg | Freq: Once | INTRAMUSCULAR | Status: AC | PRN

## 2014-03-21 MED ORDER — WARFARIN - PHARMACIST DOSING INPATIENT: Freq: Every day | Status: DC

## 2014-03-21 MED ORDER — INSULIN ASPART 100 UNIT/ML ~~LOC~~ SOLN
0.0000 [IU] | Freq: Every day | SUBCUTANEOUS | Status: DC
  Administered 2014-03-22: 2 [IU] via SUBCUTANEOUS

## 2014-03-21 MED ORDER — INSULIN ASPART 100 UNIT/ML ~~LOC~~ SOLN
0.0000 [IU] | Freq: Three times a day (TID) | SUBCUTANEOUS | Status: DC
  Administered 2014-03-21: 1 [IU] via SUBCUTANEOUS
  Administered 2014-03-21 – 2014-03-22 (×3): 2 [IU] via SUBCUTANEOUS
  Administered 2014-03-23: 3 [IU] via SUBCUTANEOUS
  Administered 2014-03-23: 1 [IU] via SUBCUTANEOUS
  Administered 2014-03-24: 2 [IU] via SUBCUTANEOUS

## 2014-03-21 NOTE — Consult Note (Signed)
NEURO HOSPITALIST CONSULT NOTE    Reason for Consult: Delirium  HPI:                                                                                                                                          Lindsay Sheppard is an 80 y.o. female who was recently hospitalized for AMS and transient speech difficulty.  Work up for TIA was unrevealing, MRI was obtained showing no acute infarct, Echo was normal with no thrombus, LDL WNL.  Patient was discharged home on Friday.  She had a goods night sleep Friday and woke up on Saturday "normal" per daughter.  On Saturday night she did not sleep and was having visual hallucinations. Patein was brought back to the hospital. Since Saturday she has not sleep through the night. Currently She is sleeping but easily aroused. Daughter feels he mentation has been well but admits to a decline. She notes her mother is very regimented and does everything at specific time--if regime is altered she has difficulty. Daughter takes care of all the billing and event planning for patient.     Past Medical History  Diagnosis Date  . POSTHERPETIC NEURALGIA 11/02/2009  . B12 DEFICIENCY 05/23/2007  . HYPERLIPIDEMIA 05/23/2007  . HYPERKALEMIA 05/30/2010  . ANEMIA-IRON DEFICIENCY 01/26/2007  . BLEPHARITIS, BILATERAL 02/23/2008  . Other specified forms of hearing loss 05/30/2010  . HYPERTENSION 01/26/2007  . Atrial fibrillation 05/23/2007  . DIASTOLIC HEART FAILURE, CHRONIC 12/14/2009  . POSTURAL HYPOTENSION 02/07/2008  . ALLERGIC RHINITIS 11/02/2009  . GERD 01/26/2007  . CONSTIPATION 01/25/2008  . SKIN LESION 05/30/2010  . BACK PAIN 08/02/2007  . OSTEOPOROSIS 05/23/2007  . SYNCOPE 02/07/2008  . FATIGUE 09/06/2007  . Dysuria 09/05/2008  . Abdominal pain, epigastric 09/06/2007  . EPIGASTRIC TENDERNESS 10/12/2007  . PULMONARY EMBOLISM, HX OF 05/23/2007  . SHINGLES, HX OF 05/23/2007  . 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 Left foot pain   . Diabetic neuropathy   . RENAL  INSUFFICIENCY 07/02/2009  . RENAL CYST, LEFT 09/06/2007  . Kidney stones     "never had OR"  . Family history of anesthesia complication     "daughter has PONV; another daughter a little anesthesia lasts way too long"  . CHF (congestive heart failure) 2011  . Myocardial infarction 2011    "mild"  . DVT (deep venous thrombosis) ~ 1964    "think it was in the left"  . Aspiration pneumonia ~ 2006    "due to aspiration"  . DIABETES MELLITUS, TYPE II 09/06/2007  . DEPRESSIVE DISORDER 01/25/2008    "@ times; never treated for it"  . Melanoma of nose   . Compression fracture of lumbar vertebra     hx  . Compression fracture of thoracic vertebra 02/07/2014    "  fell this am" (02/07/2014  . Arthritis     "hands" (03/15/2014)    Past Surgical History  Procedure Laterality Date  . Appendectomy  1943  . Abdominal hysterectomy  1964  . Cardiac electrophysiology mapping and ablation  1999  . Cataract extraction w/ intraocular lens  implant, bilateral  2003-2005  . Carpal tunnel release Right 2004  . Mohs surgery  2011    "removed off her nose"  . Cardiac catheterization  10/2009  . Esophageal dilation  X 2    Family History  Problem Relation Age of Onset  . Breast cancer Mother   . Colon cancer Father   . Stroke Father     died with stroke postop  . Diabetes Sister     2 sisters  . Hypertension Other     Social History:  reports that she has never smoked. She has never used smokeless tobacco. She reports that she does not drink alcohol or use illicit drugs.  Allergies  Allergen Reactions  . Amiodarone Other (See Comments)    REACTION: f? fluid retention  . Deltasone [Prednisone] Other (See Comments)    unknown  . Diltiazem Hcl Other (See Comments)    unknown  . Levofloxacin Nausea And Vomiting    REACTION: nausea  . Pregabalin Other (See Comments)    REACTION: hallucinations  . Spironolactone Other (See Comments)    REACTION: elevated K at lowest dose  . Tequin [Gatifloxacin]  Other (See Comments)    Hypoglycemia  . Diazepam Anxiety and Other (See Comments)    REACTION: agitation    MEDICATIONS:                                                                                                                     Prior to Admission:  Prescriptions prior to admission  Medication Sig Dispense Refill  . acetaminophen (TYLENOL) 325 MG tablet Take 2 tablets (650 mg total) by mouth 3 (three) times daily.      . cloNIDine (CATAPRES - DOSED IN MG/24 HR) 0.3 mg/24hr patch Place 0.3 mg onto the skin once a week.      Marland Kitchen CRANBERRY PO Take 1 capsule by mouth daily.       . Cyanocobalamin (VITAMIN B-12 IJ) Inject as directed every 30 (thirty) days.      . ferrous sulfate 325 (65 FE) MG tablet Take 1 tablet (325 mg total) by mouth 2 (two) times daily with a meal.  60 tablet  1  . fexofenadine (ALLEGRA) 180 MG tablet Take 180 mg by mouth daily.       . fluticasone (FLONASE) 50 MCG/ACT nasal spray Place 2 sprays into the nose daily.  16 g  2  . glucose blood (ONE TOUCH ULTRA TEST) test strip Use as instructed before meals and bedtime (qid) 250.02  Due to fluctuating blood sugars  400 each  12  . hydrochlorothiazide (HYDRODIURIL) 25 MG tablet Take 1 tablet (25 mg total) by mouth daily.  30 tablet  0  . Hypromellose (ARTIFICIAL TEARS OP) Place 1 drop into both eyes daily.      . insulin glargine (LANTUS) 100 UNIT/ML injection Inject 21 Units into the skin daily.      Marland Kitchen labetalol (NORMODYNE) 200 MG tablet Take 400 mg by mouth 2 (two) times daily.      . pantoprazole (PROTONIX) 40 MG tablet Take 1 tablet (40 mg total) by mouth daily.  90 tablet  3  . polyethylene glycol (MIRALAX / GLYCOLAX) packet Take 17 g by mouth 2 (two) times daily.  14 each  0  . pravastatin (PRAVACHOL) 40 MG tablet Take 20 mg by mouth daily. 1/2 daily      . warfarin (COUMADIN) 2 MG tablet Take 3 mg by mouth daily. 3mg  every day except on Sunday. Sunday do not take any.       Scheduled: . amLODipine  10 mg Oral  Daily  . cloNIDine  0.3 mg Transdermal Q Mon  . fluticasone  2 spray Each Nare Daily  . hydrALAZINE  50 mg Oral 3 times per day  . insulin aspart  0-5 Units Subcutaneous QHS  . insulin aspart  0-9 Units Subcutaneous TID WC  . insulin glargine  21 Units Subcutaneous Daily  . labetalol  400 mg Oral BID  . pantoprazole  40 mg Oral Daily  . polyethylene glycol  17 g Oral BID  . simvastatin  5 mg Oral q1800  . Warfarin - Physician Dosing Inpatient   Does not apply q1800     ROS:                                                                                                                                       History obtained from daughter  General ROS: negative for - chills, fatigue, fever, night sweats, weight gain or weight loss Psychological ROS: negative for - behavioral disorder, hallucinations, memory difficulties, mood swings or suicidal ideation Ophthalmic ROS: negative for - blurry vision, double vision, eye pain or loss of vision ENT ROS: negative for - epistaxis, nasal discharge, oral lesions, sore throat, tinnitus or vertigo Allergy and Immunology ROS: negative for - hives or itchy/watery eyes Hematological and Lymphatic ROS: negative for - bleeding problems, bruising or swollen lymph nodes Endocrine ROS: negative for - galactorrhea, hair pattern changes, polydipsia/polyuria or temperature intolerance Respiratory ROS: negative for - cough, hemoptysis, shortness of breath or wheezing Cardiovascular ROS: negative for - chest pain, dyspnea on exertion, edema or irregular heartbeat Gastrointestinal ROS: negative for - abdominal pain, diarrhea, hematemesis, nausea/vomiting or stool incontinence Genito-Urinary ROS: negative for - dysuria, hematuria, incontinence or urinary frequency/urgency Musculoskeletal ROS: negative for - joint swelling or muscular weakness Neurological ROS: as noted in HPI Dermatological ROS: negative for rash and skin lesion changes   Blood pressure 161/71,  pulse 78, temperature 98.5 F (36.9 C), temperature source Oral, resp. rate 18, weight 54.375 kg (  119 lb 14 oz), SpO2 98.00%.   Neurologic Examination:                                                                                                      General: NAD Mental Status: Alert, not oriented to place, year or month.  Able to recognize daughter.  Speech fluent without evidence of aphasia.  Able to follow 3 step commands without difficulty. Cranial Nerves: II: Discs flat bilaterally; Visual fields grossly normal, pupils equal, round, reactive to light and accommodation III,IV, VI: ptosis not present, extra-ocular motions intact bilaterally V,VII: smile symmetric, facial light touch sensation normal bilaterally VIII: hearing normal bilaterally IX,X: gag reflex present XI: bilateral shoulder shrug XII: midline tongue extension without atrophy or fasciculations  Motor: Moves all extremities antigravity Sensory: Pinprick and light touch intact throughout, bilaterally Deep Tendon Reflexes:  1+ throughout with no AJ  Plantars: Mute bilaterally Cerebellar: normal finger-to-nose,  normal heel-to-shin test Gait: not ested CV: pulses palpable throughout    Lab Results: Basic Metabolic Panel:  Recent Labs Lab 03/15/14 1307 03/15/14 1316 03/19/14 1003  NA 138 137 142  K 4.8 4.6 4.3  CL 100 105 100  CO2 25  --  27  GLUCOSE 186* 185* 178*  BUN 31* 33* 30*  CREATININE 1.19* 1.20* 1.22*  CALCIUM 9.8  --  10.5    Liver Function Tests:  Recent Labs Lab 03/15/14 1307 03/19/14 1003  AST 24 17  ALT 20 15  ALKPHOS 73 79  BILITOT 0.3 0.4  PROT 6.2 6.8  ALBUMIN 3.6 4.1   No results found for this basename: LIPASE, AMYLASE,  in the last 168 hours  Recent Labs Lab 03/19/14 1800  AMMONIA 10*    CBC:  Recent Labs Lab 03/15/14 1307 03/15/14 1316 03/17/14 0325 03/19/14 1003  WBC 5.6  --  4.7 6.3  NEUTROABS 4.1  --   --   --   HGB 13.2 14.3 12.2 14.1  HCT 41.1  42.0 38.5 43.1  MCV 91.9  --  92.1 91.1  PLT 148*  --  149* 164    Cardiac Enzymes: No results found for this basename: CKTOTAL, CKMB, CKMBINDEX, TROPONINI,  in the last 168 hours  Lipid Panel:  Recent Labs Lab 03/16/14 0610  CHOL 111  TRIG 107  HDL 36*  CHOLHDL 3.1  VLDL 21  LDLCALC 54    CBG:  Recent Labs Lab 03/20/14 1248 03/20/14 1700 03/20/14 2141 03/21/14 0646 03/21/14 1116  GLUCAP 210* 168* 269* 139* 253*    Microbiology: Results for orders placed during the hospital encounter of 02/07/14  URINE CULTURE     Status: None   Collection Time    02/07/14  8:45 AM      Result Value Ref Range Status   Specimen Description URINE, CATHETERIZED   Final   Special Requests Normal   Final   Culture  Setup Time     Final   Value: 02/07/2014 11:15     Performed at [facility]     Final  Value: NO GROWTH     Performed at [facility]   Culture     Final   Value: NO GROWTH     Performed at [facility]   Report Status 02/08/2014 FINAL   Final    Coagulation Studies:  Recent Labs  03/19/14 1003 03/20/14 0530 03/21/14 0446  LABPROT 29.2* 31.0* 27.6*  INR 2.76* 2.98* 2.57*    Imaging: No results found.     Assessment and plan per attending neurologist  Etta Quill PA-C [facility] [phone]  03/21/2014, 12:44 PM   Assessment/Plan: 80 YO female with onset of confusion and hallucinations after discharge from hospital.  UA, TSH, ammonia, B12 and folate all WNL.  EEG obtained showing no seizure activity or focus. MRI are has been ordered.  Likely delirium in setting of underlying neurocognitive decline. Given her history of Afib agree with repeat MRI. If negative would not further neurological work up and would have patient followed on a out patient basis.   Patient seen and examined together with physician assistant and I concur with the assessment and plan.  Dorian Pod, MD

## 2014-03-21 NOTE — Clinical Social Work Psychosocial (Signed)
Clinical Social Work Department BRIEF PSYCHOSOCIAL ASSESSMENT 03/21/2014  Patient:  Lindsay Sheppard, Lindsay Sheppard     Account Number:  [id number]     Admit date:  03/19/2014  Clinical Social Worker:  Delrae Sawyers  Date/Time:  03/21/2014 01:01 PM  Referred by:  Physician  Date Referred:  03/21/2014 Referred for  Other - See comment   Other Referral:   [facility] living situation.   Interview type:  Family Other interview type:   CSW spoke with pt's daughter, Lindsay Sheppard, at bedside.    PSYCHOSOCIAL DATA Living Status:  FAMILY Admitted from facility:   Level of care:   Primary support name:  Lindsay Sheppard Primary support relationship to patient:  CHILD, ADULT Degree of support available:   Per pt's daughter, pt has five children that are active caretakers for pt. Pt's daughter informed CSW, pt currently lives with one of pt's son.    CURRENT CONCERNS Current Concerns  Post-Acute Placement   Other Concerns:   none.    SOCIAL WORK ASSESSMENT / PLAN CSW met with pt and pt's daughter, Lindsay Sheppard, at bedside. Pt appeared to be resting comfortably. Pt's daughter informed CSW pt is from home with son, and was active with [facility].    CSW offered support as pt's daughter informed CSW of pt's "hardships" and recent illness. Pt's daughter stated family would potentially be open to SNF placement if pt does not improve. CSW offered support and will continue to follow pt.    RNCM updated on information above.   Assessment/plan status:  Psychosocial Support/Ongoing Assessment of Needs Other assessment/ plan:   none.   Information/referral to community resources:   none at the moment.    PATIENT'S/FAMILY'S RESPONSE TO PLAN OF CARE: Pt's daughter stated appreciation for CSW's "ear" and support. Pt's daughter expressed concern for pt's illness, but had no further questions/concerns for CSW.       Lubertha Sayres, MSW, [facility] Licensed Clinical Social Worker [phone] and  [phone]

## 2014-03-21 NOTE — Progress Notes (Signed)
ANTICOAGULATION CONSULT NOTE - Initial Consult  Pharmacy Consult for Coumadin Indication: Hx Afib and DVT  Allergies  Allergen Reactions  . Amiodarone Other (See Comments)    REACTION: f? fluid retention  . Deltasone [Prednisone] Other (See Comments)    unknown  . Diltiazem Hcl Other (See Comments)    unknown  . Levofloxacin Nausea And Vomiting    REACTION: nausea  . Pregabalin Other (See Comments)    REACTION: hallucinations  . Spironolactone Other (See Comments)    REACTION: elevated K at lowest dose  . Tequin [Gatifloxacin] Other (See Comments)    Hypoglycemia  . Diazepam Anxiety and Other (See Comments)    REACTION: agitation    Patient Measurements: Weight: 119 lb 14 oz (54.375 kg) Heparin Dosing Weight:   Vital Signs: Temp: 98.5 F (36.9 C) (07/21 0652) Temp src: Oral (07/21 0652) BP: 161/71 mmHg (07/21 0652) Pulse Rate: 78 (07/21 0652)  Labs:  Recent Labs  03/19/14 1003 03/20/14 0530 03/21/14 0446  HGB 14.1  --   --   HCT 43.1  --   --   PLT 164  --   --   LABPROT 29.2* 31.0* 27.6*  INR 2.76* 2.98* 2.57*  CREATININE 1.22*  --   --     The CrCl is unknown because both a height and weight (above a minimum accepted value) are required for this calculation.   Medical History: Past Medical History  Diagnosis Date  . POSTHERPETIC NEURALGIA 11/02/2009  . B12 DEFICIENCY 05/23/2007  . HYPERLIPIDEMIA 05/23/2007  . HYPERKALEMIA 05/30/2010  . ANEMIA-IRON DEFICIENCY 01/26/2007  . BLEPHARITIS, BILATERAL 02/23/2008  . Other specified forms of hearing loss 05/30/2010  . HYPERTENSION 01/26/2007  . Atrial fibrillation 05/23/2007  . DIASTOLIC HEART FAILURE, CHRONIC 12/14/2009  . POSTURAL HYPOTENSION 02/07/2008  . ALLERGIC RHINITIS 11/02/2009  . GERD 01/26/2007  . CONSTIPATION 01/25/2008  . SKIN LESION 05/30/2010  . BACK PAIN 08/02/2007  . OSTEOPOROSIS 05/23/2007  . SYNCOPE 02/07/2008  . FATIGUE 09/06/2007  . Dysuria 09/05/2008  . Abdominal pain, epigastric 09/06/2007  .  EPIGASTRIC TENDERNESS 10/12/2007  . PULMONARY EMBOLISM, HX OF 05/23/2007  . SHINGLES, HX OF 05/23/2007  . 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 Left foot pain   . Diabetic neuropathy   . RENAL INSUFFICIENCY 07/02/2009  . RENAL CYST, LEFT 09/06/2007  . Kidney stones     "never had OR"  . Family history of anesthesia complication     "daughter has PONV; another daughter a little anesthesia lasts way too long"  . CHF (congestive heart failure) 2011  . Myocardial infarction 2011    "mild"  . DVT (deep venous thrombosis) ~ 1964    "think it was in the left"  . Aspiration pneumonia ~ 2006    "due to aspiration"  . DIABETES MELLITUS, TYPE II 09/06/2007  . DEPRESSIVE DISORDER 01/25/2008    "@ times; never treated for it"  . Melanoma of nose   . Compression fracture of lumbar vertebra     hx  . Compression fracture of thoracic vertebra 02/07/2014    "fell this am" (02/07/2014  . Arthritis     "hands" (03/15/2014)    Medications:  Prescriptions prior to admission  Medication Sig Dispense Refill  . acetaminophen (TYLENOL) 325 MG tablet Take 2 tablets (650 mg total) by mouth 3 (three) times daily.      . cloNIDine (CATAPRES - DOSED IN MG/24 HR) 0.3 mg/24hr patch Place 0.3 mg onto the skin once a week.      Marland Kitchen  CRANBERRY PO Take 1 capsule by mouth daily.       . Cyanocobalamin (VITAMIN B-12 IJ) Inject as directed every 30 (thirty) days.      . ferrous sulfate 325 (65 FE) MG tablet Take 1 tablet (325 mg total) by mouth 2 (two) times daily with a meal.  60 tablet  1  . fexofenadine (ALLEGRA) 180 MG tablet Take 180 mg by mouth daily.       . fluticasone (FLONASE) 50 MCG/ACT nasal spray Place 2 sprays into the nose daily.  16 g  2  . glucose blood (ONE TOUCH ULTRA TEST) test strip Use as instructed before meals and bedtime (qid) 250.02  Due to fluctuating blood sugars  400 each  12  . hydrochlorothiazide (HYDRODIURIL) 25 MG tablet Take 1 tablet (25 mg total) by mouth daily.  30 tablet  0  . Hypromellose  (ARTIFICIAL TEARS OP) Place 1 drop into both eyes daily.      . insulin glargine (LANTUS) 100 UNIT/ML injection Inject 21 Units into the skin daily.      Marland Kitchen labetalol (NORMODYNE) 200 MG tablet Take 400 mg by mouth 2 (two) times daily.      . pantoprazole (PROTONIX) 40 MG tablet Take 1 tablet (40 mg total) by mouth daily.  90 tablet  3  . polyethylene glycol (MIRALAX / GLYCOLAX) packet Take 17 g by mouth 2 (two) times daily.  14 each  0  . pravastatin (PRAVACHOL) 40 MG tablet Take 20 mg by mouth daily. 1/2 daily      . warfarin (COUMADIN) 2 MG tablet Take 3 mg by mouth daily. 3mg  every day except on Sunday. Sunday do not take any.        Assessment: 89yof on chronic Coumadin for hx Afib and DVT. MD placed initial Coumadin order now requesting Pharmacy to dose per progress notes. INR (2.57) remains therapeutic on patient's PTA regimen (3mg  daily except none on Sundays) - will continue and follow-up AM INR. - H/H and Plts wnl - No significant bleeding reported  Goal of Therapy:  INR 2-3 Monitor platelets by anticoagulation protocol: Yes   Plan:  1. Continue Coumadin 3mg  PO daily except nothing on Sun 2. Daily PT / INR  [name] [phone] 03/21/2014,2:02 PM

## 2014-03-21 NOTE — Progress Notes (Signed)
Pt to MRI

## 2014-03-21 NOTE — Progress Notes (Signed)
Patient pivots from bed to Lindsay Sheppard with 2 person assist and use of walker. Very weak and unsteady, poor ROM of both lower extremities. All limbs tremulous after event. Patients daughter insistent upon ambulating to [facility], with stating she has postural hypotension and becomes very weak when standing with poor strength in both lower extremities.

## 2014-03-21 NOTE — Progress Notes (Signed)
Patient was taken down to MRI last night however they were unsuccessful in getting patient to remain calm to do test.  Patient only have Tylenol PO and nothing for anxiety ordered.  Patient was sent back to unit with test not done.  Patient's daughter is requesting anxiety medication and sleep aid for her mother.

## 2014-03-21 NOTE — Progress Notes (Addendum)
Patient Demographics  Lindsay Sheppard, is a 80 y.o. female, DOB - [birth year], ZHY:[id number]  Admit date - 03/19/2014   Admitting Physician Delfina Redwood, MD  Outpatient Primary MD for the patient is [name], MD  LOS - 2   Chief Complaint  Patient presents with  . Altered Mental Status        Subjective:   Lindsay Sheppard today has, No headache, No chest pain, No abdominal pain - No Nausea, No new weakness tingling or numbness, No Cough - SOB.    Assessment & Plan    1. Acute delirium - etiology unclear, question hypertensive encephalopathy. She was recently admitted 3-4 days ago for a stroke workup which was unremarkable, question if she had TIA then and now has a small infarct. Head CT unremarkable, she denies headache or fevers, no neck stiffness on exam. No focal deficits on exam which is limited but still looks stable.    Chest x-ray, UA and electrolytes appear stable as well, will repeat MRI of the brain to rule out a small infarct which is pending, checked EEG. Hold Risperdal. Some improvement overnight but still confused. Neuro requested to evaluate. Currently on Coumadin which will be continued.    2. Essential hypertension - blood pressure wass out of control, continue labetalol, Catapres at home dose, added Norvasc and scheduled hydralazine along with IV hydralazine as needed. Blood pressure better today.    3. History of diabetes mellitus type 2. Continue Lantus and sliding scale, noted A1c  CBG (last 3)   Recent Labs  03/20/14 1700 03/20/14 2141 03/21/14 0646  GLUCAP 168* 269* 139*     Lab Results  Component Value Date   HGBA1C 7.2* 03/16/2014     4. Atrial fibrillation. On labetalol and Coumadin. Pharmacy monitoring INR.    5. History of low vitamin B12  continue supplementation check a level.    6. Dyslipidemia. Continue home dose statin.  Lab Results  Component Value Date   CHOL 111 03/16/2014   HDL 36* 03/16/2014   LDLCALC 54 03/16/2014   TRIG 107 03/16/2014   CHOLHDL 3.1 03/16/2014     7. GERD. Continue PPI.      Code Status: Full  Family Communication: daughter bedside and upon phone  Disposition Plan: TBD   Procedures CT Head, MRI Brain, EEG   Consults  neurology   Medications  Scheduled Meds: . amLODipine  10 mg Oral Daily  . cloNIDine  0.3 mg Transdermal Q Mon  . fluticasone  2 spray Each Nare Daily  . hydrALAZINE  50 mg Oral 3 times per day  . insulin aspart  0-5 Units Subcutaneous QHS  . insulin aspart  0-9 Units Subcutaneous TID WC  . insulin glargine  21 Units Subcutaneous Daily  . labetalol  400 mg Oral BID  . pantoprazole  40 mg Oral Daily  . polyethylene glycol  17 g Oral BID  . simvastatin  5 mg Oral q1800  . Warfarin - Physician Dosing Inpatient   Does not apply q1800   Continuous Infusions:  PRN Meds:.acetaminophen, acetaminophen, hydrALAZINE, LORazepam  DVT Prophylaxis  Coumadin  Lab Results  Component Value Date   INR 2.57* 03/21/2014   INR 2.98* 03/20/2014   INR 2.76*  03/19/2014   PROTIME 19.0 02/16/2009   PROTIME 14.7 02/09/2009   PROTIME 16.6 01/26/2009      Lab Results  Component Value Date   PLT 164 03/19/2014    Antibiotics     Anti-infectives   None          Objective:   Filed Vitals:   03/20/14 0733 03/20/14 1348 03/20/14 2100 03/21/14 0652  BP: 160/91 161/71 175/77 161/71  Pulse: 98 82 98 78  Temp:  97.9 F (36.6 C) 98.1 F (36.7 C) 98.5 F (36.9 C)  TempSrc:  Oral Oral Oral  Resp:  16 16 18   Weight:      SpO2:  99% 99% 98%    Wt Readings from Last 3 Encounters:  03/19/14 54.375 kg (119 lb 14 oz)  03/15/14 54.4 kg (119 lb 14.9 oz)  02/23/14 55.339 kg (122 lb)     Intake/Output Summary (Last 24 hours) at 03/21/14 1133 Last data filed at 03/20/14  1553  Gross per 24 hour  Intake      0 ml  Output    500 ml  Net   -500 ml     Physical Exam  Awake , pleasantly confused, No new F.N deficits followed basic commands, Normal affect [place].AT,PERRAL Supple Neck,No JVD, No cervical lymphadenopathy appriciated.  Symmetrical Chest wall movement, Good air movement bilaterally, CTAB RRR,No Gallops,Rubs or new Murmurs, No Parasternal Heave +ve B.Sounds, Abd Soft, No tenderness, No organomegaly appriciated, No rebound - guarding or rigidity. No Cyanosis, Clubbing or edema, No new Rash or bruise     Data Review   Micro Results No results found for this or any previous visit (from the past 240 hour(s)).  Radiology Reports Dg Chest 2 View  03/19/2014   CLINICAL DATA:  Altered mental status  EXAM: CHEST  2 VIEW  COMPARISON:  02/07/2014  FINDINGS: Cardiac silhouette is mildly enlarged. No mediastinal or hilar masses. No lung consolidation or edema. No pleural effusion or pneumothorax. Lungs are hyperexpanded.  The bony thorax is demineralized, but stable.  IMPRESSION: No acute cardiopulmonary disease.   Electronically Signed   By: Lajean Manes M.D.   On: 03/19/2014 10:52   Ct Head Wo Contrast  03/19/2014   CLINICAL DATA:  Confusion.  Hallucination.  EXAM: CT HEAD WITHOUT CONTRAST  TECHNIQUE: Contiguous axial images were obtained from the base of the skull through the vertex without intravenous contrast.  COMPARISON:  MRI 03/15/2014.  Head CT 03/15/2014.  FINDINGS: The brain shows generalized atrophy. There is no sign of old or acute focal infarction, mass lesion, hemorrhage, hydrocephalus or extra-axial collection. The calvarium is unremarkable. The sinuses are clear. There is atherosclerotic calcification of the major vessels at the base of the brain.  IMPRESSION: No acute finding.  Chronic atrophy as seen on the previous studies.   Electronically Signed   By: Nelson Chimes M.D.   On: 03/19/2014 10:32      Mr Brain Wo Contrast  03/15/2014    CLINICAL DATA:  Episode of slurred speech.  Evaluate for CVA.  EXAM: MRI HEAD WITHOUT CONTRAST  TECHNIQUE: Multiplanar, multiecho pulse sequences of the brain and surrounding structures were obtained without intravenous contrast.  COMPARISON:  CT head without contrast 03/15/2014. MRI brain 3/17/ 0 6  FINDINGS: The diffusion-weighted images demonstrate no evidence for acute or subacute infarct. Moderate generalized atrophy is present. Minimal white matter disease is within normal limits for age. No hemorrhage or mass lesion is present. Mild white matter changes extend  into the brainstem.  Flow is present in the major intracranial arteries. The patient is status post bilateral lens replacements. Minimal mucosal thickening is present in the anterior ethmoid air cells and frontal sinuses. There is minimal fluid in the mastoid air cells. No obstructing nasopharyngeal lesion is evident.  IMPRESSION: 1. No acute intracranial abnormality. 2. Atrophy and white matter disease is likely within normal limits for age. 3. Minimal sinus disease.   Electronically Signed   By: Lawrence Santiago M.D.   On: 03/15/2014 16:57    CBC  Recent Labs Lab 03/15/14 1307 03/15/14 1316 03/17/14 0325 03/19/14 1003  WBC 5.6  --  4.7 6.3  HGB 13.2 14.3 12.2 14.1  HCT 41.1 42.0 38.5 43.1  PLT 148*  --  149* 164  MCV 91.9  --  92.1 91.1  MCH 29.5  --  29.2 29.8  MCHC 32.1  --  31.7 32.7  RDW 14.4  --  14.2 13.9  LYMPHSABS 0.9  --   --   --   MONOABS 0.5  --   --   --   EOSABS 0.1  --   --   --   BASOSABS 0.0  --   --   --     Chemistries   Recent Labs Lab 03/15/14 1307 03/15/14 1316 03/19/14 1003  NA 138 137 142  K 4.8 4.6 4.3  CL 100 105 100  CO2 25  --  27  GLUCOSE 186* 185* 178*  BUN 31* 33* 30*  CREATININE 1.19* 1.20* 1.22*  CALCIUM 9.8  --  10.5  AST 24  --  17  ALT 20  --  15  ALKPHOS 73  --  79  BILITOT 0.3  --  0.4    ------------------------------------------------------------------------------------------------------------------ CrCl is unknown because both a height and weight (above a minimum accepted value) are required for this calculation. ------------------------------------------------------------------------------------------------------------------ No results found for this basename: HGBA1C,  in the last 72 hours ------------------------------------------------------------------------------------------------------------------ No results found for this basename: CHOL, HDL, LDLCALC, TRIG, CHOLHDL, LDLDIRECT,  in the last 72 hours ------------------------------------------------------------------------------------------------------------------  Recent Labs  03/20/14 1312  TSH 1.930   ------------------------------------------------------------------------------------------------------------------  Recent Labs  03/20/14 1312  VITAMINB12 887    Coagulation profile  Recent Labs Lab 03/16/14 0610 03/17/14 0325 03/19/14 1003 03/20/14 0530 03/21/14 0446  INR 2.82* 2.77* 2.76* 2.98* 2.57*    No results found for this basename: DDIMER,  in the last 72 hours  Cardiac Enzymes No results found for this basename: CK, CKMB, TROPONINI, MYOGLOBIN,  in the last 168 hours ------------------------------------------------------------------------------------------------------------------ No components found with this basename: POCBNP,      Time Spent in minutes   35   Lurlean Kernen K M.D on 03/21/2014 at 11:33 AM  Between 7am to 7pm - Pager - [phone]  After 7pm go to [email or web address] - password [email or web address]  And look for the night coverage person covering for me after hours  [facility] Office  [phone]   **Disclaimer: This note may have been dictated with voice recognition software. Similar sounding words can inadvertently be transcribed and this note may contain  transcription errors which may not have been corrected upon publication of note.**

## 2014-03-21 NOTE — Progress Notes (Signed)
Patient's daughter is also concerned for her mother's glucose. Last night glucose was 269.  Daughter is requesting sliding scale for her mother.

## 2014-03-22 DIAGNOSIS — E119 Type 2 diabetes mellitus without complications: Secondary | ICD-10-CM

## 2014-03-22 DIAGNOSIS — I4891 Unspecified atrial fibrillation: Secondary | ICD-10-CM

## 2014-03-22 DIAGNOSIS — E43 Unspecified severe protein-calorie malnutrition: Secondary | ICD-10-CM

## 2014-03-22 DIAGNOSIS — I10 Essential (primary) hypertension: Secondary | ICD-10-CM

## 2014-03-22 DIAGNOSIS — R404 Transient alteration of awareness: Secondary | ICD-10-CM | POA: Diagnosis not present

## 2014-03-22 LAB — GLUCOSE, CAPILLARY
GLUCOSE-CAPILLARY: 156 mg/dL — AB (ref 70–99)
Glucose-Capillary: 107 mg/dL — ABNORMAL HIGH (ref 70–99)
Glucose-Capillary: 181 mg/dL — ABNORMAL HIGH (ref 70–99)
Glucose-Capillary: 219 mg/dL — ABNORMAL HIGH (ref 70–99)

## 2014-03-22 LAB — PROTIME-INR
INR: 2.84 — ABNORMAL HIGH (ref 0.00–1.49)
Prothrombin Time: 29.8 seconds — ABNORMAL HIGH (ref 11.6–15.2)

## 2014-03-22 NOTE — Progress Notes (Signed)
Report called to receiving nurse - patient transferring to 5W 33.

## 2014-03-22 NOTE — Progress Notes (Signed)
ANTICOAGULATION CONSULT NOTE - Follow Up Consult  Pharmacy Consult for Coumadin Indication: Hx Afib and DVT  Allergies  Allergen Reactions  . Amiodarone Other (See Comments)    REACTION: f? fluid retention  . Deltasone [Prednisone] Other (See Comments)    unknown  . Diltiazem Hcl Other (See Comments)    unknown  . Levofloxacin Nausea And Vomiting    REACTION: nausea  . Pregabalin Other (See Comments)    REACTION: hallucinations  . Spironolactone Other (See Comments)    REACTION: elevated K at lowest dose  . Tequin [Gatifloxacin] Other (See Comments)    Hypoglycemia  . Diazepam Anxiety and Other (See Comments)    REACTION: agitation    Patient Measurements: Weight: 119 lb 14 oz (54.375 kg) Heparin Dosing Weight:   Vital Signs: Temp: 98.1 F (36.7 C) (07/22 0559) BP: 152/73 mmHg (07/22 0559) Pulse Rate: 95 (07/22 0559)  Labs:  Recent Labs  03/20/14 0530 03/21/14 0446 03/22/14 0705  LABPROT 31.0* 27.6* 29.8*  INR 2.98* 2.57* 2.84*    The CrCl is unknown because both a height and weight (above a minimum accepted value) are required for this calculation.  Assessment: Lindsay Sheppard on chronic Coumadin for hx Afib and DVT. INR (2.84) remains therapeutic on PTA Coumadin regimen (3mg  daily except none on Sundays) - will continue and follow-up AM INR. - No CBC this AM - No significant bleeding reported  Goal of Therapy:  INR 2-3   Plan:  1. Continue Coumadin 3mg  PO daily except nothing on Sun  2. Daily PT / INR   Lindsay Sheppard [phone] 03/22/2014,10:18 AM

## 2014-03-22 NOTE — Progress Notes (Addendum)
[facility] PROGRESS NOTE  KAROLINE FLEER GYI:[id number] DOB: 1920/12/25 DOA: 03/19/2014 PCP: [name], MD  Assessment/Plan: 1. Acute delirium - etiology unclear, question hypertensive encephalopathy. She was recently admitted 3-4 days ago for a stroke workup which was unremarkable, Head CT unremarkable -MRI neg for acute findings, EEG neg -on coumadin, INR therapeutic - no further evaa per Neuro, OUT pt follow -consult PT/OT  2. Essential hypertension - blood pressure wass out of control, continue labetalol, Catapres at home dose, added Norvasc and scheduled hydralazine along with IV hydralazine as needed. Blood pressure better today.  3. History of diabetes mellitus type 2.  -Continue Lantus and sliding scale, noted A1c 4. H/O AFIB - continue coumadin INR therapeutic, rate controlled 5. H/O CHF -compensated   Code Status: full Family Communication: daughter and grand daughter at bedside Disposition Plan: likely to SNF   Consultants:  Neuro  Procedures:  EEG This awake and drowsy EEG is abnormal due to mild diffuse slowing  of the waking background  Antibiotics:  none   HPI/Subjective: She denies weakness  Objective: Filed Vitals:   03/22/14 1714  BP: 156/73  Pulse: 80  Temp: 97.9 F (36.6 C)  Resp: 18   No intake or output data in the 24 hours ending 03/22/14 1750 Filed Weights   03/19/14 0937 03/22/14 1714  Weight: 54.375 kg (119 lb 14 oz) 56.6 kg (124 lb 12.5 oz)    Exam:  General: alert, disoriented, some confusion In NAD Cardiovascular: RRR, nl S1 s2 Respiratory: CTAB Abdomen: soft +BS NT/ND, no masses palpable Extremities: No cyanosis and no edema    Data Reviewed: Basic Metabolic Panel:  Recent Labs Lab 03/19/14 1003  NA 142  K 4.3  CL 100  CO2 27  GLUCOSE 178*  BUN 30*  CREATININE 1.22*  CALCIUM 10.5   Liver Function Tests:  Recent Labs Lab 03/19/14 1003  AST 17  ALT 15  ALKPHOS 79  BILITOT 0.4  PROT 6.8   ALBUMIN 4.1   No results found for this basename: LIPASE, AMYLASE,  in the last 168 hours  Recent Labs Lab 03/19/14 1800  AMMONIA 10*   CBC:  Recent Labs Lab 03/17/14 0325 03/19/14 1003  WBC 4.7 6.3  HGB 12.2 14.1  HCT 38.5 43.1  MCV 92.1 91.1  PLT 149* 164   Cardiac Enzymes: No results found for this basename: CKTOTAL, CKMB, CKMBINDEX, TROPONINI,  in the last 168 hours BNP (last 3 results) No results found for this basename: PROBNP,  in the last 8760 hours CBG:  Recent Labs Lab 03/21/14 1628 03/21/14 2129 03/22/14 0621 03/22/14 1126 03/22/14 1627  GLUCAP 182* 172* 107* 156* 181*    No results found for this or any previous visit (from the past 240 hour(s)).   Studies: Mr Brain Wo Contrast  03/21/2014   CLINICAL DATA:  Decreased mental status  EXAM: MRI HEAD WITHOUT CONTRAST  TECHNIQUE: Multiplanar, multiecho pulse sequences of the brain and surrounding structures were obtained without intravenous contrast.  COMPARISON:  CT head 03/19/2014  FINDINGS: Diffuse cortical atrophy. Negative for hydrocephalus. Minimal chronic microvascular ischemic change in the white matter and pons. Small chronic infarcts in the thalamus bilaterally  Negative for acute infarct. Negative for hemorrhage or mass. No shift of the midline structures.  Paranasal sinuses are clear.  IMPRESSION: Generalized atrophy. Very mild chronic microvascular ischemia. No acute abnormality.   Electronically Signed   By: Franchot Gallo M.D.   On: 03/21/2014 20:31    Scheduled Meds: .  amLODipine  10 mg Oral Daily  . cloNIDine  0.3 mg Transdermal Q Mon  . fluticasone  2 spray Each Nare Daily  . hydrALAZINE  50 mg Oral 3 times per day  . insulin aspart  0-5 Units Subcutaneous QHS  . insulin aspart  0-9 Units Subcutaneous TID WC  . insulin glargine  21 Units Subcutaneous Daily  . labetalol  400 mg Oral BID  . pantoprazole  40 mg Oral Daily  . polyethylene glycol  17 g Oral BID  . simvastatin  5 mg Oral  q1800  . warfarin  3 mg Oral Once per day on Mon Tue Wed Thu Fri Sat  . 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 If 7PM-7AM, please contact night-coverage at [email or web address], password [facility] 03/22/2014, 5:50 PM  LOS: 3 days

## 2014-03-22 NOTE — Progress Notes (Signed)
Lindsay Sheppard is a 80 y.o. female patient who transferred  from 5N awake, alert  & orientated Self only, Full Code, VSS - Blood pressure 156/73, pulse 80, temperature 97.9 F (36.6 C), temperature source Oral, resp. rate 18, height 5\' 6"  (1.676 m), weight 56.6 kg (124 lb 12.5 oz), SpO2 98.00%., R/A, no c/o shortness of breath, no c/o chest pain, no distress noted.  Non telemetry.   IV site WDL: forearm left, condition patent, no redness and reddened with a transparent dsg that's clean dry and intact.  Allergies:   Allergies  Allergen Reactions  . Amiodarone Other (See Comments)    REACTION: f? fluid retention  . Deltasone [Prednisone] Other (See Comments)    unknown  . Diltiazem Hcl Other (See Comments)    unknown  . Levofloxacin Nausea And Vomiting    REACTION: nausea  . Pregabalin Other (See Comments)    REACTION: hallucinations  . Spironolactone Other (See Comments)    REACTION: elevated K at lowest dose  . Tequin [Gatifloxacin] Other (See Comments)    Hypoglycemia  . Diazepam Anxiety and Other (See Comments)    REACTION: agitation     Past Medical History  Diagnosis Date  . POSTHERPETIC NEURALGIA 11/02/2009  . B12 DEFICIENCY 05/23/2007  . HYPERLIPIDEMIA 05/23/2007  . HYPERKALEMIA 05/30/2010  . ANEMIA-IRON DEFICIENCY 01/26/2007  . BLEPHARITIS, BILATERAL 02/23/2008  . Other specified forms of hearing loss 05/30/2010  . HYPERTENSION 01/26/2007  . Atrial fibrillation 05/23/2007  . DIASTOLIC HEART FAILURE, CHRONIC 12/14/2009  . POSTURAL HYPOTENSION 02/07/2008  . ALLERGIC RHINITIS 11/02/2009  . GERD 01/26/2007  . CONSTIPATION 01/25/2008  . SKIN LESION 05/30/2010  . BACK PAIN 08/02/2007  . OSTEOPOROSIS 05/23/2007  . SYNCOPE 02/07/2008  . FATIGUE 09/06/2007  . Dysuria 09/05/2008  . Abdominal pain, epigastric 09/06/2007  . EPIGASTRIC TENDERNESS 10/12/2007  . PULMONARY EMBOLISM, HX OF 05/23/2007  . SHINGLES, HX OF 05/23/2007  . 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 Left foot pain   . Diabetic  neuropathy   . RENAL INSUFFICIENCY 07/02/2009  . RENAL CYST, LEFT 09/06/2007  . Kidney stones     "never had OR"  . Family history of anesthesia complication     "daughter has PONV; another daughter a little anesthesia lasts way too long"  . CHF (congestive heart failure) 2011  . Myocardial infarction 2011    "mild"  . DVT (deep venous thrombosis) ~ 1964    "think it was in the left"  . Aspiration pneumonia ~ 2006    "due to aspiration"  . DIABETES MELLITUS, TYPE II 09/06/2007  . DEPRESSIVE DISORDER 01/25/2008    "@ times; never treated for it"  . Melanoma of nose   . Compression fracture of lumbar vertebra     hx  . Compression fracture of thoracic vertebra 02/07/2014    "fell this am" (02/07/2014  . Arthritis     "hands" (03/15/2014)    Pt orientation to unit, room and routine. SR up x 2, fall risk assessment complete with Patient and family verbalizing understanding of risks associated with falls. Pt verbalizes an understanding of how to use the call bell and to call for help before getting out of bed.  Skin, clean-dry- intact without evidence of bruising, or skin tears.   No evidence of skin break down noted on exam, stage 1 to sacrum, EPC applied.     Will cont to monitor and assist as needed.  Lindalou Hose, RN 03/22/2014 5:30 PM

## 2014-03-23 DIAGNOSIS — R404 Transient alteration of awareness: Secondary | ICD-10-CM | POA: Diagnosis not present

## 2014-03-23 LAB — GLUCOSE, CAPILLARY
GLUCOSE-CAPILLARY: 204 mg/dL — AB (ref 70–99)
Glucose-Capillary: 110 mg/dL — ABNORMAL HIGH (ref 70–99)
Glucose-Capillary: 145 mg/dL — ABNORMAL HIGH (ref 70–99)
Glucose-Capillary: 139 mg/dL — ABNORMAL HIGH (ref 70–99)

## 2014-03-23 LAB — BASIC METABOLIC PANEL
Anion gap: 14 (ref 5–15)
BUN: 49 mg/dL — AB (ref 6–23)
CALCIUM: 9.5 mg/dL (ref 8.4–10.5)
CHLORIDE: 103 meq/L (ref 96–112)
CO2: 23 meq/L (ref 19–32)
CREATININE: 1.71 mg/dL — AB (ref 0.50–1.10)
GFR calc Af Amer: 29 mL/min — ABNORMAL LOW (ref >90)
GFR calc non Af Amer: 25 mL/min — ABNORMAL LOW (ref >90)
GLUCOSE: 88 mg/dL (ref 70–99)
Potassium: 4.1 mEq/L (ref 3.7–5.3)
Sodium: 140 mEq/L (ref 137–147)

## 2014-03-23 LAB — PROTIME-INR
INR: 2.87 — ABNORMAL HIGH (ref 0.00–1.49)
Prothrombin Time: 30.1 seconds — ABNORMAL HIGH (ref 11.6–15.2)

## 2014-03-23 MED ORDER — HYDRALAZINE HCL 25 MG PO TABS
25.0000 mg | ORAL_TABLET | Freq: Three times a day (TID) | ORAL | Status: DC
  Administered 2014-03-24 (×2): 25 mg via ORAL
  Filled 2014-03-23 (×4): qty 1

## 2014-03-23 MED ORDER — SODIUM CHLORIDE 0.9 % IV BOLUS (SEPSIS)
300.0000 mL | Freq: Once | INTRAVENOUS | Status: AC
  Administered 2014-03-23: 300 mL via INTRAVENOUS

## 2014-03-23 NOTE — Progress Notes (Signed)
Came to see patient to offer [facility] [employer] services. Spoke with daughter, Jean Rosenthal consents were signed. Daughter reports the plan is for home and that her mother is already active with [facility] for PT/OT/RN services. States she and her sister have been alternating staying with patient. States she is interested in having a sitter at home as well. Of note, daughter states she is not really interested in her mother going to SNF but is not oppose to it but would have to speak with her sister about it. As it stands, patient will receive post hospital discharge call and will be evaluated for monthly home visits. Explained that these services will not interfere or replace home health. Will make inpatient RNCM aware of bedside encounter. Ivin Booty May (daughter) [phone]. Luanna Salk (daughter) [phone].  [facility] [phone]

## 2014-03-23 NOTE — Evaluation (Signed)
Occupational Therapy Evaluation Patient Details Name: Lindsay Sheppard MRN: [id number] DOB: May 12, 1921 Today's Date: 03/23/2014    History of Present Illness 80 y.o. adm 03/19/14 with acute delirium/hallucinations. All workups negative including MRI brain. Pt with 3 day hospital stay week previous to adm after fall at home to r/o CVA (which was negative). PMH of HTN and orthostasis/syncope, diabetes, CKD, recurrent UTI (on suppression therapy with bactrim); atrial fibrillation ( on coumadin), anemia; osteoporosis with back pain/compression fractures.    Clinical Impression   Prior to admission, pt could sponge bathe with assist for back and periarea and dress with minimal assist for LB.  She was supervised for all ambulation.  Pt has had a significant decline in cognition impeding her ability to initiate and sequence all ADL with decreased balance further impeding safety and independence.  The family is having difficulty providing the necessary care for pt, but prefers to take her home, appreciating that pt will likely have less confusion in her home environment.  Will follow.    Follow Up Recommendations  Home health OT;Supervision/Assistance - 24 hour    Equipment Recommendations  None recommended by OT    Recommendations for Other Services       Precautions / Restrictions Precautions Precautions: Fall Restrictions Weight Bearing Restrictions: No      Mobility Bed Mobility Overal bed mobility:  (not assessed, pt up in chair)             General bed mobility comments: up in chair on arrival  Transfers Overall transfer level: Needs assistance Equipment used: Rolling walker (2 wheeled) Transfers: Sit to/from Stand Sit to Stand: Min assist         General transfer comment: assist to power up and shift weight forward, verbal cues for hand placement    Balance Overall balance assessment: Needs assistance Sitting-balance support: Feet supported Sitting balance-Leahy  Scale: Fair     Standing balance support: Bilateral upper extremity supported Standing balance-Leahy Scale: Poor                              ADL Overall ADL's : Needs assistance/impaired Eating/Feeding: Supervision/ safety;Sitting;Set up   Grooming: Wash/dry hands;Standing;Minimal assistance Grooming Details (indicate cue type and reason): verbal cues for sequence, location of towels, soap Upper Body Bathing: Moderate assistance;Sitting   Lower Body Bathing: Moderate assistance;Sit to/from stand   Upper Body Dressing : Minimal assistance;Sitting   Lower Body Dressing: Moderate assistance;Sit to/from stand   Toilet Transfer: Minimal assistance;Ambulation;BSC (over toilet)   Toileting- Clothing Manipulation and Hygiene: Maximal assistance;Sit to/from stand       Functional mobility during ADLs: Min guard;Rolling walker General ADL Comments: Poor problem solving and sequencing, requiring max verbal cues.     Vision                     Perception     Praxis      Pertinent Vitals/Pain No pain, VSS     Hand Dominance Right   Extremity/Trunk Assessment Upper Extremity Assessment Upper Extremity Assessment: Overall WFL for tasks assessed   Lower Extremity Assessment Lower Extremity Assessment: Defer to PT evaluation   Cervical / Trunk Assessment Cervical / Trunk Assessment: Kyphotic   Communication Communication Communication: HOH   Cognition Arousal/Alertness: Awake/alert Behavior During Therapy: Anxious Overall Cognitive Status: Impaired/Different from baseline Area of Impairment: Orientation;Attention;Following commands;Memory;Awareness;Problem solving Orientation Level: Place;Time;Situation Current Attention Level: Sustained Memory: Decreased short-term memory;Decreased recall of  precautions Following Commands: Follows one step commands inconsistently;Follows one step commands with increased time   Awareness:  (not yet intellectual  understanding of deficits) Problem Solving: Slow processing;Decreased initiation;Difficulty sequencing;Requires verbal cues;Requires tactile cues General Comments: does not appear to be hallucinating   General Comments       Exercises      Shoulder Instructions      Home Living Family/patient expects to be discharged to:: Private residence Living Arrangements: Children Available Help at Discharge: Family;Available 24 hours/day Type of Home: House Home Access: Stairs to enter [employer] of Steps: 2 Entrance Stairs-Rails: Right;Left;Can reach both Home Layout: Two level;Able to live on main level with bedroom/bathroom     Bathroom Shower/Tub: Walk-in shower (pt sponge bathes at baseline)   Bathroom Toilet: Handicapped height     Home Equipment: Environmental consultant - 2 wheels;Wheelchair - [employer];Shower seat;Grab bars - tub/shower;Adaptive equipment ([place] tried reacher, pt could not manage) Adaptive Equipment: Reacher Additional Comments: Lives with son, however 2 daughters provide care in day vs night shifts.Sons work (some on their farm)       Prior Functioning/Environment Level of Independence: Needs Product/process development scientist / Transfers Assistance Needed: min guard to ambulate in home with RW ADL's / Homemaking Assistance Needed: assisted for bathing, dressing, but participates, family performs housekeeping, pt was helping with some cooking prior to last admission Communication / Swallowing Assistance Needed: HOH      OT Diagnosis: Generalized weakness;Cognitive deficits   OT Problem List: Impaired balance (sitting and/or standing);Decreased cognition;Decreased knowledge of use of DME or AE;Decreased safety awareness   OT Treatment/Interventions: Self-care/ADL training;DME and/or AE instruction;Therapeutic activities;Patient/family education;Balance training;Cognitive remediation/compensation    OT Goals(Current goals can be found in the care plan section) Acute  Rehab OT Goals Patient Stated Goal: daughter wants pt to function at her baseline OT Goal Formulation: With patient/family Time For Goal Achievement: 03/30/14 Potential to Achieve Goals: Fair ADL Goals Pt Will Perform Grooming: with supervision;standing Pt Will Perform Upper Body Bathing: with min assist;sitting Pt Will Perform Lower Body Bathing: with min assist;sit to/from stand Pt Will Perform Upper Body Dressing: with min assist;sitting Pt Will Perform Lower Body Dressing: with min assist;sit to/from stand Pt Will Transfer to Toilet: with supervision;ambulating;bedside commode (over toilet) Pt Will Perform Toileting - Clothing Manipulation and hygiene: with supervision;sit to/from stand  OT Frequency: Min 2X/week   Barriers to D/C:            Co-evaluation              End of Session    Activity Tolerance: Patient tolerated treatment well Patient left: in chair;with call bell/phone within reach;with chair alarm set;with family/visitor present   Time: 6568-1275 OT Time Calculation (min): 51 min Charges:  OT General Charges $OT Visit: 1 Procedure OT Evaluation $Initial OT Evaluation Tier I: 1 Procedure OT Treatments $Self Care/Home Management : 8-22 mins G-Codes: OT G-codes **NOT FOR INPATIENT CLASS** Functional Assessment Tool Used: clincial judgment Functional Limitation: Self care Self Care Current Status (T7001): At least 40 percent but less than 60 percent impaired, limited or restricted Self Care Goal Status (V4944): At least 20 percent but less than 40 percent impaired, limited or restricted  Malka So 03/23/2014, 12:17 PM [phone]

## 2014-03-23 NOTE — Evaluation (Signed)
Physical Therapy Evaluation Patient Details Name: Lindsay Sheppard MRN: [id number] DOB: September 12, 1920 Today's Date: 03/23/2014   History of Present Illness  80 y.o. adm 03/19/14 with acute delirium/hallucinations. All workups negative including MRI brain. Pt with 3 day hospital stay week previous to adm after fall at home to r/o CVA (which was negative). PMH of HTN and orthostasis/syncope, diabetes, CKD, recurrent UTI (on suppression therapy with bactrim); atrial fibrillation ( on coumadin), anemia; osteoporosis with back pain/compression fractures.   Clinical Impression  Pt admitted with delirium/hallucinations. Lengthy discussion with daughter, [name], regarding the stress caring for her mother during days, while worrying about her husband at her home with Parkinson's (who is declining in health). She is very torn re: decision to continue to care for her mother at home--knowing she will be less confused in her own home, and yet feeling exhausted by caring for her 7 am-5pm each day in her mother's home. She is interested in using sitters at night (her sister currently cares for pt each night 5p-7a). Then her sister and she can split the day shift. All this is contingent on pt's hallucinations resolving and pt being close to her baseline dementia. Pt currently with functional limitations due to the deficits listed below (see PT Problem List).  Pt will benefit from skilled PT to increase their independence and safety with mobility to allow discharge to the venue listed below.       Follow Up Recommendations Home health PT;Supervision/Assistance - 24 hour (if 24/7 care can be provided; if not, daughter will accept need for SNF)    Equipment Recommendations  None recommended by PT    Recommendations for Other Services OT consult     Precautions / Restrictions Precautions Precautions: Fall      Mobility  Bed Mobility               General bed mobility comments: up in chair on  arrival  Transfers Overall transfer level: Needs assistance Equipment used: Rolling walker (2 wheeled) Transfers: Sit to/from Stand Sit to Stand: Mod assist;Min assist         General transfer comment: incr assist for initial transfer, progressed to min assist over 3 transfers; assist for ant weight shift and hip/knee extension with 100% cues for sequencing with RW  Ambulation/Gait Ambulation/Gait assistance: Min assist Ambulation Distance (Feet): 40 Feet Assistive device: Rolling walker (2 wheeled) Gait Pattern/deviations: Step-through pattern;Decreased stride length;Wide base of support Gait velocity: decr Gait velocity interpretation: <1.8 ft/sec, indicative of risk for recurrent falls General Gait Details: Pt tremulous in standing, however after standing exercises BP improved and able to walk short distance (still tremulous, but no buckling). Required vc and assist to maneuver RW around obstacles and during turns.  Stairs            Wheelchair Mobility    Modified Rankin (Stroke Patients Only)       Balance Overall balance assessment: Needs assistance;History of Falls Sitting-balance support: No upper extremity supported;Feet supported Sitting balance-Leahy Scale: Fair     Standing balance support: Bilateral upper extremity supported Standing balance-Leahy Scale: Poor                               Pertinent Vitals/Pain See vitals flow sheet. Pt with orthostasis with changes in position    Home Living Family/patient expects to be discharged to:: Private residence Living Arrangements: Children Available Help at Discharge: Family;Available 24 hours/day Type of Home:  House Home Access: Stairs to enter Entrance Stairs-Rails: Right;Left;Can reach both Entrance Stairs-Number of Steps: 2 Home Layout: Two level;Able to live on main level with bedroom/bathroom Home Equipment: Gilford Rile - 2 wheels;Wheelchair - [employer];Shower seat;Grab bars  - tub/shower;Adaptive equipment Additional Comments: Lives with son, however 2 daughters provide care in day vs night shifts.Sons work (some on their farm)     Prior Function Level of Independence: Needs Water engineer / Transfers Assistance Needed: min guard assist ambulating 40 feet in home with RW  ADL's / Homemaking Assistance Needed: bird bathes, had started dressing herself partially; pt uses BSC at night beside bed        Hand Dominance   Dominant Hand: Right    Extremity/Trunk Assessment   Upper Extremity Assessment: Generalized weakness           Lower Extremity Assessment: Generalized weakness      Cervical / Trunk Assessment: Kyphotic  Communication   Communication: HOH (deaf Lt ear)  Cognition Arousal/Alertness: Awake/alert Behavior During Therapy: Anxious Overall Cognitive Status: Impaired/Different from baseline Area of Impairment: Orientation;Attention;Following commands;Memory;Awareness;Problem solving Orientation Level: Place;Time;Situation Current Attention Level: Sustained Memory: Decreased short-term memory;Decreased recall of precautions Following Commands: Follows one step commands inconsistently;Follows one step commands with increased time   Awareness:  (not yet intellectual understanding of deficits) Problem Solving: Slow processing;Decreased initiation;Difficulty sequencing;Requires verbal cues;Requires tactile cues General Comments: daughter thinks she is no longer hallucinating    General Comments      Exercises General Exercises - Lower Extremity Hip Flexion/Marching: Both;10 reps;Standing (each leg alternating) Mini-Sqauts: 10 reps;Standing      Assessment/Plan    PT Assessment Patient needs continued PT services  PT Diagnosis Difficulty walking;Generalized weakness   PT Problem List Decreased strength;Decreased activity tolerance;Decreased balance;Decreased mobility;Decreased cognition;Decreased knowledge of use of  DME;Decreased safety awareness;Decreased knowledge of precautions;Cardiopulmonary status limiting activity  PT Treatment Interventions DME instruction;Gait training;Stair training;Functional mobility training;Therapeutic activities;Therapeutic exercise;Balance training;Cognitive remediation;Patient/family education   PT Goals (Current goals can be found in the Care Plan section) Acute Rehab PT Goals Patient Stated Goal: unable; daughter wants pt to be at baseline dementia (not delirious and climbing out of bed) PT Goal Formulation: With patient/family Time For Goal Achievement: 03/30/14 Potential to Achieve Goals: Fair    Frequency Min 3X/week   Barriers to discharge Decreased caregiver support daughters feeling overwhelmed; interested in sitters for night    Co-evaluation               End of Session Equipment Utilized During Treatment: Gait belt Activity Tolerance: Patient limited by fatigue Patient left: in chair;with chair alarm set;with family/visitor present (daughter has call bell) Nurse Communication: Other (comment) (decr BP with ambulation)    Functional Assessment Tool Used: clinical observation Functional Limitation: Mobility: Walking and moving around Mobility: Walking and Moving Around Current Status [phone]): At least 20 percent but less than 40 percent impaired, limited or restricted Mobility: Walking and Moving Around Goal Status [phone]): At least 1 percent but less than 20 percent impaired, limited or restricted    Time: 0841-0910 PT Time Calculation (min): 29 min   Charges:   PT Evaluation $Initial PT Evaluation Tier I: 1 Procedure PT Treatments $Gait Training: 8-22 mins   PT G Codes:   Functional Assessment Tool Used: clinical observation Functional Limitation: Mobility: Walking and moving around    Hardie Veltre 03/23/2014, 9:32 AM Pager [phone]

## 2014-03-23 NOTE — Discharge Instructions (Signed)

## 2014-03-23 NOTE — Progress Notes (Signed)
ANTICOAGULATION CONSULT NOTE - Follow Up Consult  Pharmacy Consult for warfarin Indication: atrial fibrillation and VTE prophylaxis  Allergies  Allergen Reactions  . Amiodarone Other (See Comments)    REACTION: f? fluid retention  . Deltasone [Prednisone] Other (See Comments)    unknown  . Diltiazem Hcl Other (See Comments)    unknown  . Levofloxacin Nausea And Vomiting    REACTION: nausea  . Pregabalin Other (See Comments)    REACTION: hallucinations  . Spironolactone Other (See Comments)    REACTION: elevated K at lowest dose  . Tequin [Gatifloxacin] Other (See Comments)    Hypoglycemia  . Diazepam Anxiety and Other (See Comments)    REACTION: agitation    Patient Measurements: Height: 5\' 6"  (167.6 cm) Weight: 124 lb 12.5 oz (56.6 kg) IBW/kg (Calculated) : 59.3  Vital Signs: Temp: 97.8 F (36.6 C) (07/23 0511) Temp src: Oral (07/23 0511) BP: 121/64 mmHg (07/23 1014) Pulse Rate: 86 (07/23 1014)  Labs:  Recent Labs  03/21/14 0446 03/22/14 0705 03/23/14 0500  LABPROT 27.6* 29.8* 30.1*  INR 2.57* 2.84* 2.87*  CREATININE  --   --  1.71*    Estimated Creatinine Clearance: 19.5 ml/min (by C-G formula based on Cr of 1.71).   Assessment: 70 yof admitted for acute delirium.  Patient has a history of Afib and was on warfarin outpatient.  INR has remained consistent at 2.57 (7/21), 2.84 (7.22), and 2.87 (7/23).  Patient currently being managed on outpatient regimen of 3 mg daily, except no dose on Sunday.  Will continue home regimen and monitor daily INR. Goal of Therapy:  INR 2-3    Plan:  1) Warfarin 3 mg daily except no dose on Sunday 2) Monitor daily INR, s/sx of bleeding  Theron Arista, PharmD Clinical Pharmacist - Resident Pager: [phone] 7/23/201510:50 AM

## 2014-03-23 NOTE — Progress Notes (Signed)
[facility] PROGRESS NOTE  ASPYN WARNKE MGQ:[id number] DOB: 1921-05-19 DOA: 03/19/2014 PCP: [name], MD  Assessment/Plan: 1. Acute delirium - etiology unclear, question hypertensive encephalopathy. She was recently admitted 3-4 days ago for a stroke workup which was unremarkable, Head CT unremarkable -MRI neg for acute findings, EEG neg -on coumadin, INR therapeutic - no further evaa per Neuro, OUT pt follow -consult PT/OT  2. Essential hypertension - patient had poor BP control initially and so Norvasc and hydralazine had been added but now BP found to 93 systolic this a.m. and on review of vitals patient orthostatic>> will discontinue Norvasc, and decrease hydralazine dose with hold parameters. Continue labetalol and Catapres for now with hold parameters as well. 3. History of diabetes mellitus type 2.  -Continue Lantus and sliding scale, noted A1c 4. H/O AFIB - continue coumadin INR therapeutic, rate controlled 5. H/O CHF -compensated 6.Orthostatic hypotension -Hydrate cautiously given her history of CHF, follow and recheck 7. Acute kidney injury -Hydrate with IV fluids as above follow and recheck  Code Status: full Family Communication: daughter and grand daughter at bedside Disposition Plan: likely to SNF   Consultants:  Neuro  Procedures:  EEG This awake and drowsy EEG is abnormal due to mild diffuse slowing  of the waking background  Antibiotics:  none   HPI/Subjective: She is sitting up in chair, feels much better today. Less confused  Objective: Filed Vitals:   03/23/14 1014  BP: 121/64  Pulse: 86  Temp:   Resp:    No intake or output data in the 24 hours ending 03/23/14 1317 Filed Weights   03/19/14 0937 03/22/14 1714  Weight: 54.375 kg (119 lb 14 oz) 56.6 kg (124 lb 12.5 oz)    Exam:  General: alert, disoriented, more appropriate Cardiovascular: RRR, nl S1 s2 Respiratory: CTAB Abdomen: soft +BS NT/ND, no masses  palpable Extremities: No cyanosis and no edema    Data Reviewed: Basic Metabolic Panel:  Recent Labs Lab 03/19/14 1003 03/23/14 0500  NA 142 140  K 4.3 4.1  CL 100 103  CO2 27 23  GLUCOSE 178* 88  BUN 30* 49*  CREATININE 1.22* 1.71*  CALCIUM 10.5 9.5   Liver Function Tests:  Recent Labs Lab 03/19/14 1003  AST 17  ALT 15  ALKPHOS 79  BILITOT 0.4  PROT 6.8  ALBUMIN 4.1   No results found for this basename: LIPASE, AMYLASE,  in the last 168 hours  Recent Labs Lab 03/19/14 1800  AMMONIA 10*   CBC:  Recent Labs Lab 03/17/14 0325 03/19/14 1003  WBC 4.7 6.3  HGB 12.2 14.1  HCT 38.5 43.1  MCV 92.1 91.1  PLT 149* 164   Cardiac Enzymes: No results found for this basename: CKTOTAL, CKMB, CKMBINDEX, TROPONINI,  in the last 168 hours BNP (last 3 results) No results found for this basename: PROBNP,  in the last 8760 hours CBG:  Recent Labs Lab 03/22/14 1126 03/22/14 1627 03/22/14 2019 03/23/14 0805 03/23/14 1158  GLUCAP 156* 181* 219* 110* 145*    No results found for this or any previous visit (from the past 240 hour(s)).   Studies: Mr Brain Wo Contrast  03/21/2014   CLINICAL DATA:  Decreased mental status  EXAM: MRI HEAD WITHOUT CONTRAST  TECHNIQUE: Multiplanar, multiecho pulse sequences of the brain and surrounding structures were obtained without intravenous contrast.  COMPARISON:  CT head 03/19/2014  FINDINGS: Diffuse cortical atrophy. Negative for hydrocephalus. Minimal chronic microvascular ischemic change in the white matter and pons.  Small chronic infarcts in the thalamus bilaterally  Negative for acute infarct. Negative for hemorrhage or mass. No shift of the midline structures.  Paranasal sinuses are clear.  IMPRESSION: Generalized atrophy. Very mild chronic microvascular ischemia. No acute abnormality.   Electronically Signed   By: Franchot Gallo M.D.   On: 03/21/2014 20:31    Scheduled Meds: . cloNIDine  0.3 mg Transdermal Q Mon  .  fluticasone  2 spray Each Nare Daily  . [START ON 03/24/2014] hydrALAZINE  25 mg Oral 3 times per day  . insulin aspart  0-5 Units Subcutaneous QHS  . insulin aspart  0-9 Units Subcutaneous TID WC  . insulin glargine  21 Units Subcutaneous Daily  . labetalol  400 mg Oral BID  . pantoprazole  40 mg Oral Daily  . polyethylene glycol  17 g Oral BID  . simvastatin  5 mg Oral q1800  . sodium chloride  300 mL Intravenous Once  . warfarin  3 mg Oral Once per day on Mon Tue Wed Thu Fri Sat  . Warfarin - Pharmacist Dosing Inpatient   Does not apply q1800   Continuous Infusions:   Principal Problem:   Acute delirium Active Problems:   HYPERLIPIDEMIA   Atrial fibrillation   GERD   Chronic constipation   CKD (chronic kidney disease) 3-4   Delirium   Malignant hypertension    Time spent: >67mins    [facility] Pager [phone]. If 7PM-7AM, please contact night-coverage at [email or web address], password [facility] 03/23/2014, 1:17 PM  LOS: 4 days

## 2014-03-24 DIAGNOSIS — I5032 Chronic diastolic (congestive) heart failure: Secondary | ICD-10-CM

## 2014-03-24 DIAGNOSIS — N189 Chronic kidney disease, unspecified: Secondary | ICD-10-CM

## 2014-03-24 DIAGNOSIS — R404 Transient alteration of awareness: Secondary | ICD-10-CM | POA: Diagnosis not present

## 2014-03-24 LAB — BASIC METABOLIC PANEL
ANION GAP: 12 (ref 5–15)
BUN: 45 mg/dL — ABNORMAL HIGH (ref 6–23)
CHLORIDE: 104 meq/L (ref 96–112)
CO2: 25 mEq/L (ref 19–32)
Calcium: 9.8 mg/dL (ref 8.4–10.5)
Creatinine, Ser: 1.43 mg/dL — ABNORMAL HIGH (ref 0.50–1.10)
GFR calc non Af Amer: 31 mL/min — ABNORMAL LOW (ref >90)
GFR, EST AFRICAN AMERICAN: 36 mL/min — AB (ref >90)
Glucose, Bld: 70 mg/dL (ref 70–99)
POTASSIUM: 4.5 meq/L (ref 3.7–5.3)
Sodium: 141 mEq/L (ref 137–147)

## 2014-03-24 LAB — GLUCOSE, CAPILLARY
GLUCOSE-CAPILLARY: 76 mg/dL (ref 70–99)
Glucose-Capillary: 155 mg/dL — ABNORMAL HIGH (ref 70–99)

## 2014-03-24 LAB — PROTIME-INR
INR: 3.19 — AB (ref 0.00–1.49)
PROTHROMBIN TIME: 32.7 s — AB (ref 11.6–15.2)

## 2014-03-24 MED ORDER — WARFARIN SODIUM 2 MG PO TABS: 3.0000 mg | ORAL_TABLET | Freq: Every day | ORAL | Status: DC

## 2014-03-24 MED ORDER — ZOLPIDEM TARTRATE 5 MG PO TABS
5.0000 mg | ORAL_TABLET | Freq: Once | ORAL | Status: AC
  Administered 2014-03-24: 5 mg via ORAL
  Filled 2014-03-24: qty 1

## 2014-03-24 MED ORDER — AMLODIPINE BESYLATE 5 MG PO TABS
5.0000 mg | ORAL_TABLET | Freq: Every day | ORAL | Status: DC
Start: 2014-03-24 — End: 2014-08-08

## 2014-03-24 MED ORDER — RISPERIDONE 0.5 MG PO TBDP: 0.5000 mg | ORAL_TABLET | Freq: Every evening | ORAL | Status: DC

## 2014-03-24 NOTE — Clinical Social Work Note (Addendum)
CSW met with RNCM and family in room. Family is upset that patient is admitted as observation and that SNF placement will not be paid for by Medicare. CSW reiterated to family that Medicare will not pay for SNF because patient has not had a qualifying three night stay. CSW explained that a referral could be made to SNFs, but the family would need to pay for SNF privately. Family states that this is not an option and continued to voice frustration. CSW validated family's concerns, and confirmed that the family is refusing SNF search for private pay. According to family patient DOES have the resources to pay privately for a period of time but family is choosing not to do this. CSW gave family list of SNFs and ALFs. No further CSW intervention needed at this time. CSW signing off.  Liz Beach MSW, [place], [place], [phone]

## 2014-03-24 NOTE — Progress Notes (Signed)
Patient discharge teaching given, including activity, diet, follow-up appoints, and medications. Patient verbalized understanding of all discharge instructions. IV access was d/c'd. Vitals are stable. Skin is intact except as charted in most recent assessments. Pt to be escorted out by NT, to be driven home by family. 

## 2014-03-24 NOTE — Progress Notes (Signed)
CARE MANAGEMENT NOTE 03/24/2014  Patient:  Lindsay Sheppard, Lindsay Sheppard   Account Number:  [id number]  Date Initiated:  03/24/2014  Documentation initiated by:  [facility]  Subjective/Objective Assessment:   admitted with delirium     Action/Plan:   PT/OT evals-recommended HHPT and HHOT until 07/24,   Anticipated DC Date:  03/24/2014   Anticipated DC Plan:  [place] referral  Clinical Social Worker      DC Planning Services  CM consult      [facility] Choice  Resumption Of Svcs/PTA Provider   Choice offered to / List presented to:  C-4 Adult Children        [place] arranged  HH-1 RN  [place].   Status of service:  In process, will continue to follow Medicare Important Message given?   (If response is "NO", the following Medicare IM given date fields will be blank) Date Medicare IM given:   Medicare IM given by:   Date Additional Medicare IM given:   Additional Medicare IM given by:    Discharge Disposition:    Per UR Regulation:    If discussed at Long Length of Stay Meetings, dates discussed:    Comments:  03/24/14 Spoke with patient's daughter on 03/23/14 about HHC, PT and OT recommending [facility], she was agreeable to continuing [place] with [facility]. Gave her requested list of private duty providers. Informed her that patient is in under observation status and that SNF would therfor not be covered by Medicare.Fuller Plan RN, BSN, CCM

## 2014-03-24 NOTE — Progress Notes (Signed)
ANTICOAGULATION CONSULT NOTE - Follow Up Consult  Pharmacy Consult for warfarin Indication: h/o AFib and DVT  Allergies  Allergen Reactions  . Amiodarone Other (See Comments)    REACTION: f? fluid retention  . Deltasone [Prednisone] Other (See Comments)    unknown  . Diltiazem Hcl Other (See Comments)    unknown  . Levofloxacin Nausea And Vomiting    REACTION: nausea  . Pregabalin Other (See Comments)    REACTION: hallucinations  . Spironolactone Other (See Comments)    REACTION: elevated K at lowest dose  . Tequin [Gatifloxacin] Other (See Comments)    Hypoglycemia  . Diazepam Anxiety and Other (See Comments)    REACTION: agitation    Patient Measurements: Height: 5\' 6"  (167.6 cm) Weight: 124 lb 12.5 oz (56.6 kg) IBW/kg (Calculated) : 59.3  Vital Signs: Temp: 98.1 F (36.7 C) (07/24 7121) Temp src: Oral (07/24 0608) BP: 150/60 mmHg (07/24 1000) Pulse Rate: 80 (07/24 1000)  Labs:  Recent Labs  03/22/14 0705 03/23/14 0500 03/24/14 0600  LABPROT 29.8* 30.1* 32.7*  INR 2.84* 2.87* 3.19*  CREATININE  --  1.71* 1.43*    Estimated Creatinine Clearance: 23.4 ml/min (by C-G formula based on Cr of 1.43).   Assessment: 80 yo female admitted for acute delirium.  Patient has a history of Afib and DVT and was on warfarin outpatient.  INR is slightly supratherapeutic at 3.19 this am.   PTA warfarin dose: 3 mg daily, except no dose on Sunday.  Assuming pt is at steady state, will continue home regimen.  Goal of Therapy:  INR 2-3    Plan:  1) Warfarin 3 mg daily except Sundays 2) Daily INR 3) Monitor s/sx of bleeding   Hughes Better, PharmD, BCPS Clinical Pharmacist Pager: [phone] 03/24/2014 11:03 AM

## 2014-03-24 NOTE — Progress Notes (Signed)
07/74/15 Spoke with daughter [name] regarding [place] options, aide and Education officer, museum added. Contacted Butch Penny at [place] ad set up [facility] aide and Education officer, museum in addition to [facility], [place] and [place]. Patient will also be followed by [facility]. Fuller Plan RN, [facility]

## 2014-03-24 NOTE — Progress Notes (Signed)
INITIAL NUTRITION ASSESSMENT  DOCUMENTATION CODES Per approved criteria  -Severe malnutrition in the context of chronic illness   INTERVENTION:  Glucerna Shake PO TID, each supplement provides 220 kcal and 10 grams of protein  NUTRITION DIAGNOSIS: Malnutrition related to inadequate oral intake as evidenced by severe depletion of muscle and subcutaneous fat mass.   Goal: Intake to meet >90% of estimated nutrition needs.  Monitor:  PO intake, labs, weight trend.  Reason for Assessment: MST  80 y.o. female  Admitting Dx: Acute delirium  ASSESSMENT: 80 y.o. female with several recent hospitalizations, most recently for workup of slurred speech. Discharged 3 days prior to current admission, at which time speech had normalized and workup negative for stroke. Did well for several days, then began having visual hallucinations, seeing children, ants. TSH, B12 unremarkable last month. In ED, workup including UA, CXR, CT brain unremarkable. Blood pressure initially normal, but now high.    Patient's daughter reports that patient's weight has been in the 130's for the past 6-12 months. More recently, she lost down to 119 lb, now up to 124 lb. She has been eating well since this admission. Daughter requests chopped meats for patient, will add to current diet order.  Nutrition Focused Physical Exam:  Subcutaneous Fat:  Orbital Region: severe depletion Upper Arm Region: severe depletion Thoracic and Lumbar Region: NA  Muscle:  Temple Region: severe depletion Clavicle Bone Region: severe depletion Clavicle and Acromion Bone Region: severe depletion Scapular Bone Region: NA Dorsal Hand: mild-moderate depletion Patellar Region: moderate depletion Anterior Thigh Region: severe depletion Posterior Calf Region: severe depletion  Edema: none  Pt meets criteria for severe MALNUTRITION in the context of chronic illness as evidenced by severe depletion of muscle and subcutaneous fat  mass.   Height: Ht Readings from Last 1 Encounters:  03/22/14 5\' 6"  (1.676 m)    Weight: Wt Readings from Last 1 Encounters:  03/22/14 124 lb 12.5 oz (56.6 kg)    Ideal Body Weight: 130 lb  % Ideal Body Weight: 95%  Wt Readings from Last 10 Encounters:  03/22/14 124 lb 12.5 oz (56.6 kg)  03/15/14 119 lb 14.9 oz (54.4 kg)  02/23/14 122 lb (55.339 kg)  02/16/14 123 lb 3.2 oz (55.883 kg)  02/09/14 116 lb 13.5 oz (53 kg)  11/30/13 130 lb (58.968 kg)  11/08/13 130 lb (58.968 kg)  11/01/13 129 lb 4 oz (58.627 kg)  10/04/13 133 lb (60.328 kg)  09/27/13 133 lb 6.4 oz (60.51 kg)    Usual Body Weight: 133 lb (6 months ago)  % Usual Body Weight: 93%  BMI:  Body mass index is 20.15 kg/(m^2).  Estimated Nutritional Needs: Kcal: 1400-1600 Protein: 75-90 gm Fluid: 1.4-1.6 L  Skin: stage 1 pressure ulcer to sacrum  Diet Order: Cardiac  EDUCATION NEEDS: -Education needs addressed   Intake/Output Summary (Last 24 hours) at 03/24/14 1022 Last data filed at 03/24/14 0211  Gross per 24 hour  Intake    300 ml  Output    300 ml  Net      0 ml    Last BM: 7/23   Labs:   Recent Labs Lab 03/19/14 1003 03/23/14 0500 03/24/14 0600  NA 142 140 141  K 4.3 4.1 4.5  CL 100 103 104  CO2 27 23 25   BUN 30* 49* 45*  CREATININE 1.22* 1.71* 1.43*  CALCIUM 10.5 9.5 9.8  GLUCOSE 178* 88 70    CBG (last 3)   Recent Labs  03/23/14 1726  03/23/14 2108 03/24/14 0748  GLUCAP 204* 139* 76    Scheduled Meds: . cloNIDine  0.3 mg Transdermal Q Mon  . fluticasone  2 spray Each Nare Daily  . hydrALAZINE  25 mg Oral 3 times per day  . insulin aspart  0-5 Units Subcutaneous QHS  . insulin aspart  0-9 Units Subcutaneous TID WC  . insulin glargine  21 Units Subcutaneous Daily  . labetalol  400 mg Oral BID  . pantoprazole  40 mg Oral Daily  . polyethylene glycol  17 g Oral BID  . simvastatin  5 mg Oral q1800  . warfarin  3 mg Oral Once per day on Mon Tue Wed Thu Fri Sat  .  Warfarin - Pharmacist Dosing Inpatient   Does not apply q1800    Continuous Infusions:   Past Medical History  Diagnosis Date  . POSTHERPETIC NEURALGIA 11/02/2009  . B12 DEFICIENCY 05/23/2007  . HYPERLIPIDEMIA 05/23/2007  . HYPERKALEMIA 05/30/2010  . ANEMIA-IRON DEFICIENCY 01/26/2007  . BLEPHARITIS, BILATERAL 02/23/2008  . Other specified forms of hearing loss 05/30/2010  . HYPERTENSION 01/26/2007  . Atrial fibrillation 05/23/2007  . DIASTOLIC HEART FAILURE, CHRONIC 12/14/2009  . POSTURAL HYPOTENSION 02/07/2008  . ALLERGIC RHINITIS 11/02/2009  . GERD 01/26/2007  . CONSTIPATION 01/25/2008  . SKIN LESION 05/30/2010  . BACK PAIN 08/02/2007  . OSTEOPOROSIS 05/23/2007  . SYNCOPE 02/07/2008  . FATIGUE 09/06/2007  . Dysuria 09/05/2008  . Abdominal pain, epigastric 09/06/2007  . EPIGASTRIC TENDERNESS 10/12/2007  . PULMONARY EMBOLISM, HX OF 05/23/2007  . SHINGLES, HX OF 05/23/2007  . 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 Left foot pain   . Diabetic neuropathy   . RENAL INSUFFICIENCY 07/02/2009  . RENAL CYST, LEFT 09/06/2007  . Kidney stones     "never had OR"  . Family history of anesthesia complication     "daughter has PONV; another daughter a little anesthesia lasts way too long"  . CHF (congestive heart failure) 2011  . Myocardial infarction 2011    "mild"  . DVT (deep venous thrombosis) ~ 1964    "think it was in the left"  . Aspiration pneumonia ~ 2006    "due to aspiration"  . DIABETES MELLITUS, TYPE II 09/06/2007  . DEPRESSIVE DISORDER 01/25/2008    "@ times; never treated for it"  . Melanoma of nose   . Compression fracture of lumbar vertebra     hx  . Compression fracture of thoracic vertebra 02/07/2014    "fell this am" (02/07/2014  . Arthritis     "hands" (03/15/2014)    Past Surgical History  Procedure Laterality Date  . Appendectomy  1943  . Abdominal hysterectomy  1964  . Cardiac electrophysiology mapping and ablation  1999  . Cataract extraction w/ intraocular lens  implant, bilateral   2003-2005  . Carpal tunnel release Right 2004  . Mohs surgery  2011    "removed off her nose"  . Cardiac catheterization  10/2009  . Esophageal dilation  X 2    Molli Barrows, RD, LDN, [place] Pager [phone] After Hours Pager [phone]

## 2014-03-24 NOTE — Discharge Summary (Signed)
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 Discharged 3 days ago, at which time speech had normalized and workup negative for stroke. Started on HCTZ at discharge for uncontrolled HTN. Did well for several days, then began having visual hallucinations last night, seeing children, ants. Disoriented and did not sleep last night. No other new symptoms. TSH, B12 unremarkable last month. In ED today, workup including UA, CXR, CT brain unremarkable. Blood pressure initially normal, but now high. Has not taken any medication today except lantus. Patient has no complaints. She was admitted for further evaluation and management.   Hospital Course:  1. Acute delirium - etiology unclear, question hypertensive encephalopathy. She was recently admitted 3-4 days ago for a stroke workup which was unremarkable, Head CT unremarkable  -MRI neg for acute findings, EEG neg  -on coumadin, INR has been therapeutic in the hospital was slightly elevated at 3.19 today>> patient/family directed to hold Coumadin today, home health RN to check PT/INR and call to outpatient M.D. for further dosing as appropriate. - Neurology saw patient and no further eval recommended, OUT pt follow  -Her  symptoms waxed and waned in the hospital,>> findings  consistent with delirium in the setting of probable underlying dementia. -she was started on risperidone prior to discharge is to followup with PCP and outpatient neurology as needed -PT was consulted and recommended home health services initially but on followup today change the recommendation to SNF the patient has been here on observation status and so doesn't have a qualifying stay to be placed at SNF and family states they're not able to pay out of pocket. She has been sent off for home health services including social work and to follow up outpatient with her PCP 2. Essential hypertension - patient had poor BP control initially and so Norvasc and hydralazine had been added but now BP found to 93 systolic 2/13. and on review of vitals patient orthostatic>> BP meds were held on 7/23, on followup today she is less orthostatic and BPs improved. She'll be discharged on Norvasc 5 mg in addition to her preadmission medications. It is noted that she tolerated Norvasc prior to it been held on 7/23. She is to followup with PCP for continued monitoring of her blood pressures and further adjustment of her medications as clinically appropriate 3. History of diabetes mellitus type 2.  -She is to continue her outpatient regimen upon discharge. Her last A1c was 7.2 4. H/O AFIB  - continue  rate controlled, on Coumadin as above  5. H/O CHF  -compensated  6.Orthostatic hypotension  -Cautious hydrated again today prior to discharge>> she is to followup with PCP 7. Acute kidney injury  -Clinically improved with hydration creatinine 1.4 today prior to discharge   Consultants:  Neuro  Procedures:  EEG This awake and drowsy EEG is abnormal due to mild diffuse slowing  of the waking background   Discharge Exam: Filed Vitals:   03/24/14 1406  BP: 126/66  Pulse: 79  Temp: 97.4 F (36.3 C)  Resp: 16   Exam:  General: alert, oriented x1, answers  appropriately  Cardiovascular: RRR, nl S1 s2  Respiratory: CTAB  Abdomen: soft +BS NT/ND, no masses palpable  Extremities: No cyanosis and no edema    Discharge Instructions You were cared for by a hospitalist during your hospital stay. If you have any questions about your discharge medications or the care you received while you were in the hospital after you are discharged, you can call the unit and asked to speak with the hospitalist on call if the hospitalist that took care of you is not available. Once you are discharged, your primary care physician will handle any further medical issues. Please note that NO REFILLS for any discharge medications will be authorized once you are discharged, as it is imperative that you return to your primary care physician (or establish a relationship with a primary care physician if you do not have one) for your aftercare needs so that they can reassess your need for medications and monitor your lab values.  Discharge Instructions   Diet Carb Modified    Complete by:  As directed      Increase activity slowly    Complete by:  As directed             Medication List    STOP taking these medications       hydrochlorothiazide 25 MG tablet  Commonly known as:  HYDRODIURIL      TAKE these medications       acetaminophen 325 MG tablet  Commonly known as:  TYLENOL  Take 2 tablets (650 mg total) by mouth 3 (three) times daily.     amLODipine 5 MG tablet  Commonly known as:  NORVASC  Take 1 tablet (5 mg total) by mouth daily.     ARTIFICIAL TEARS OP  Place 1 drop into both eyes daily.     cloNIDine 0.3 mg/24hr patch  Commonly known as:  CATAPRES - Dosed in mg/24 hr  Place 0.3 mg onto the skin once a week.     CRANBERRY PO  Take 1 capsule by mouth daily.     ferrous sulfate 325 (65 FE) MG tablet  Take 1 tablet (325 mg total) by mouth 2 (two) times daily with a meal.     fexofenadine 180 MG tablet  Commonly known as:  ALLEGRA  Take 180 mg  by mouth daily.     fluticasone 50 MCG/ACT nasal spray  Commonly known as:  FLONASE  Place 2 sprays into the nose daily.     glucose blood test strip  Commonly known as:  ONE TOUCH ULTRA TEST  - Use as instructed before meals and bedtime (qid) 250.02  -   - Due to fluctuating blood sugars     insulin glargine 100 UNIT/ML injection  Commonly known as:  LANTUS  Inject 21 Units into the skin daily.     labetalol 200 MG tablet  Commonly known as:  NORMODYNE  Take 400 mg by mouth 2 (two) times daily.     pantoprazole 40 MG tablet  Commonly known as:  PROTONIX  Take 1 tablet (40 mg total) by mouth daily.     polyethylene glycol packet  Commonly known as:  MIRALAX / GLYCOLAX  Take 17 g by mouth 2 (two) times daily.     pravastatin 40 MG tablet  Commonly known as:  PRAVACHOL  Take 20 mg by mouth daily. 1/2 daily     risperiDONE 0.5 MG disintegrating tablet  Commonly known as:  RISPERDAL M-TABS  Take 1 tablet (0.5 mg total) by mouth every evening.     VITAMIN B-12 IJ  Inject as directed every 30 (thirty) days.     warfarin 2 MG tablet  Commonly known as:  COUMADIN  Take 3 mg by mouth daily. 3mg  every day except on Sunday. Sunday do not take any.       Allergies  Allergen Reactions  . Amiodarone Other (See Comments)    REACTION: f? fluid retention  . Deltasone [Prednisone] Other (See Comments)    unknown  . Diltiazem Hcl Other (See Comments)    unknown  . Levofloxacin Nausea And Vomiting    REACTION: nausea  . Pregabalin Other (See Comments)    REACTION: hallucinations  . Spironolactone Other (See Comments)    REACTION: elevated K at lowest dose  . Tequin [Gatifloxacin] Other (See Comments)    Hypoglycemia  . Diazepam Anxiety and Other (See Comments)    REACTION: agitation       Follow-up Information   Follow up with [name], MD. (NEXT WEEK, call for appt upon discharge)    Specialties:  Internal Medicine, Radiology   Contact information:   [facility] [place] [address] [phone]        The results of significant diagnostics from this hospitalization (including imaging, microbiology, ancillary and laboratory) are listed below for reference.    Significant Diagnostic Studies: Dg Chest 2 View  03/19/2014   CLINICAL DATA:  Altered mental status  EXAM: CHEST  2 VIEW  COMPARISON:  02/07/2014  FINDINGS: Cardiac silhouette is mildly enlarged. No mediastinal or hilar masses. No lung consolidation or edema. No pleural effusion or pneumothorax. Lungs are hyperexpanded.  The bony thorax is demineralized, but stable.  IMPRESSION: No acute cardiopulmonary disease.   Electronically Signed   By: Lajean Manes M.D.   On: 03/19/2014 10:52   Ct Head Wo Contrast  03/19/2014   CLINICAL DATA:  Confusion.  Hallucination.  EXAM: CT HEAD WITHOUT CONTRAST  TECHNIQUE: Contiguous axial images were obtained from the base of the skull through the vertex without intravenous contrast.  COMPARISON:  MRI 03/15/2014.  Head CT 03/15/2014.  FINDINGS: The brain shows generalized atrophy. There is no sign of old or acute focal infarction, mass lesion, hemorrhage, hydrocephalus or extra-axial collection. The calvarium is unremarkable. The sinuses are clear. There is atherosclerotic calcification of the major vessels at the base of the brain.  IMPRESSION: No acute finding.  Chronic atrophy as seen on the previous studies.   Electronically Signed   By: Nelson Chimes M.D.   On: 03/19/2014 10:32   Ct Head (brain) Wo Contrast  03/15/2014   CLINICAL DATA:  Code stroke.  EXAM: CT HEAD WITHOUT CONTRAST  TECHNIQUE: Contiguous axial images were obtained from the base of the skull through the vertex without intravenous contrast.  COMPARISON:  Head CT 02/07/2014.  FINDINGS: No mass. No hydrocephalus. No hemorrhage. The visualized orbits are unremarkable. Visualized paranasal sinuses and mastoids are clear.  IMPRESSION: No acute abnormality. These results were called by telephone  at the time of interpretation on 03/15/2014 at 1:24 pm to Dr.Knapp, who verbally acknowledged these results.   Electronically  Signed   By: Marcello Moores  Register   On: 03/15/2014 13:27   Mr Brain Wo Contrast  03/21/2014   CLINICAL DATA:  Decreased mental status  EXAM: MRI HEAD WITHOUT CONTRAST  TECHNIQUE: Multiplanar, multiecho pulse sequences of the brain and surrounding structures were obtained without intravenous contrast.  COMPARISON:  CT head 03/19/2014  FINDINGS: Diffuse cortical atrophy. Negative for hydrocephalus. Minimal chronic microvascular ischemic change in the white matter and pons. Small chronic infarcts in the thalamus bilaterally  Negative for acute infarct. Negative for hemorrhage or mass. No shift of the midline structures.  Paranasal sinuses are clear.  IMPRESSION: Generalized atrophy. Very mild chronic microvascular ischemia. No acute abnormality.   Electronically Signed   By: Franchot Gallo M.D.   On: 03/21/2014 20:31   Mr Brain Wo Contrast  03/15/2014   CLINICAL DATA:  Episode of slurred speech.  Evaluate for CVA.  EXAM: MRI HEAD WITHOUT CONTRAST  TECHNIQUE: Multiplanar, multiecho pulse sequences of the brain and surrounding structures were obtained without intravenous contrast.  COMPARISON:  CT head without contrast 03/15/2014. MRI brain 3/17/ 0 6  FINDINGS: The diffusion-weighted images demonstrate no evidence for acute or subacute infarct. Moderate generalized atrophy is present. Minimal white matter disease is within normal limits for age. No hemorrhage or mass lesion is present. Mild white matter changes extend into the brainstem.  Flow is present in the major intracranial arteries. The patient is status post bilateral lens replacements. Minimal mucosal thickening is present in the anterior ethmoid air cells and frontal sinuses. There is minimal fluid in the mastoid air cells. No obstructing nasopharyngeal lesion is evident.  IMPRESSION: 1. No acute intracranial abnormality. 2. Atrophy and  white matter disease is likely within normal limits for age. 3. Minimal sinus disease.   Electronically Signed   By: Lawrence Santiago M.D.   On: 03/15/2014 16:57    Microbiology: No results found for this or any previous visit (from the past 240 hour(s)).   Labs: Basic Metabolic Panel:  Recent Labs Lab 03/19/14 1003 03/23/14 0500 03/24/14 0600  NA 142 140 141  K 4.3 4.1 4.5  CL 100 103 104  CO2 27 23 25   GLUCOSE 178* 88 70  BUN 30* 49* 45*  CREATININE 1.22* 1.71* 1.43*  CALCIUM 10.5 9.5 9.8   Liver Function Tests:  Recent Labs Lab 03/19/14 1003  AST 17  ALT 15  ALKPHOS 79  BILITOT 0.4  PROT 6.8  ALBUMIN 4.1   No results found for this basename: LIPASE, AMYLASE,  in the last 168 hours  Recent Labs Lab 03/19/14 1800  AMMONIA 10*   CBC:  Recent Labs Lab 03/19/14 1003  WBC 6.3  HGB 14.1  HCT 43.1  MCV 91.1  PLT 164   Cardiac Enzymes: No results found for this basename: CKTOTAL, CKMB, CKMBINDEX, TROPONINI,  in the last 168 hours BNP: BNP (last 3 results) No results found for this basename: PROBNP,  in the last 8760 hours CBG:  Recent Labs Lab 03/23/14 1158 03/23/14 1726 03/23/14 2108 03/24/14 0748 03/24/14 1136  GLUCAP 145* 204* 139* 76 155*       Signed:  Jenea Dake C  [facility] 03/24/2014, 4:40 PM

## 2014-03-24 NOTE — Progress Notes (Signed)
Physical Therapy Treatment Patient Details Name: Lindsay Sheppard MRN: [id number] DOB: Jun 06, 1921 Today's Date: 03/24/2014    History of Present Illness 80 y.o. adm 03/19/14 with acute delirium/hallucinations. All workups negative including MRI brain. Pt with 3 day hospital stay week previous to adm after fall at home to r/o CVA (which was negative). PMH of HTN and orthostasis/syncope, diabetes, CKD, recurrent UTI (on suppression therapy with bactrim); atrial fibrillation ( on coumadin), anemia; osteoporosis with back pain/compression fractures.     PT Comments    Pt. Able to transfer with +2 min assist for safety and ambulate ~ 100' with RW min assist with second person following with recliner for safety.  Pt. Is somewhat impulsive and needs close cueing.  Daughter  [name] in room and appears frustrated at "hospital policy" that has her mom at observation status and not inpatient, thus making it difficult for family to place pt. In SNF.  I attempted to clarify for pt. However she was not receptive to explanation.  I discussed with CSW and he will follow up with daughter.  I have changed the Dc recommendation to  SNF as I am concerned that family cannot manage pt. With her current mental status.  I recognize that her observation status may not permit SNF  Transition.  Will follow to progress toward PT goals.    Follow Up Recommendations  SNF     Equipment Recommendations  None recommended by PT    Recommendations for Other Services       Precautions / Restrictions Precautions Precautions: Fall Restrictions Weight Bearing Restrictions: No    Mobility  Bed Mobility Overal bed mobility: Needs Assistance Bed Mobility: Supine to Sit     Supine to sit: Min guard        Transfers Overall transfer level: Needs assistance Equipment used: None;Rolling walker (2 wheeled) Transfers: Sit to/from [facility] Sit to Stand: Min guard;+2 physical assistance Stand pivot  transfers: +2 physical assistance;Min assist       General transfer comment: Pt. initially min assist to rise to stadn at edge of bed, +2 min assist for safe stand pivot transfer onto [facility] for urination, then +2 min assist for subsequent trials of sit to stand.  Pt. with unsafe technique in reaching for bed when not yet correctly prepared for sitting  Ambulation/Gait Ambulation/Gait assistance: Min assist;+2 safety/equipment Ambulation Distance (Feet): 100 Feet Assistive device: Rolling walker (2 wheeled) Gait Pattern/deviations: Step-through pattern;Drifts right/left Gait velocity: decr   General Gait Details: Pt. needed min assist to manage RW, min assist for safety and stabilty and second person available for safety and to bring recliner chair behind pt.     Stairs            Wheelchair Mobility    Modified Rankin (Stroke Patients Only)       Balance Overall balance assessment: Needs assistance Sitting-balance support: Feet supported Sitting balance-Leahy Scale: Fair Sitting balance - Comments: Needs UE support.   Standing balance support: Bilateral upper extremity supported;During functional activity Standing balance-Leahy Scale: Poor                      Cognition Arousal/Alertness: Awake/alert Behavior During Therapy: Restless (initially calm in supine, later restless) Overall Cognitive Status: Impaired/Different from baseline Area of Impairment: Orientation;Attention;Following commands;Memory;Awareness;Problem solving Orientation Level: Place;Time;Situation Current Attention Level: Sustained Memory: Decreased short-term memory;Decreased recall of precautions Following Commands: Follows one step commands inconsistently;Follows one step commands with increased time  Problem Solving: Slow processing;Decreased initiation;Difficulty sequencing;Requires verbal cues;Requires tactile cues General Comments: currently anxious but with no apparent  hallucinations; daughter states pt. is not at her cognitive baseline    Exercises      General Comments        Pertinent Vitals/Pain See vitals tab Attempted to get a set of orthostatics, however only able to get supine and sitting due to pt. Restlessness.  Pt. With elevated BP in supine and sitting.  I made Thelma, RN aware of these findings.      Home Living                      Prior Function            PT Goals (current goals can now be found in the care plan section) Progress towards PT goals: Progressing toward goals    Frequency  Min 3X/week    PT Plan Discharge plan needs to be updated    Co-evaluation             End of Session Equipment Utilized During Treatment: Gait belt Activity Tolerance: Patient limited by fatigue Patient left: in chair;with call bell/phone within reach;with family/visitor present (daughter present; RN working to [employer])     Time: [phone] PT Time Calculation (min): 30 min  Charges:  $Gait Training: 8-22 mins $Therapeutic Activity: 8-22 mins                    G Codes:      Ladona Ridgel 03/24/2014, 9:58 AM [place] [phone] [place] [phone]

## 2014-03-27 ENCOUNTER — Telehealth: Payer: Self-pay | Admitting: Internal Medicine

## 2014-03-27 ENCOUNTER — Ambulatory Visit (INDEPENDENT_AMBULATORY_CARE_PROVIDER_SITE_OTHER): Payer: Medicare Other | Admitting: General Practice

## 2014-03-27 DIAGNOSIS — Z5181 Encounter for therapeutic drug level monitoring: Secondary | ICD-10-CM

## 2014-03-27 LAB — POCT INR: INR: 1.9

## 2014-03-27 MED ORDER — CYANOCOBALAMIN 1000 MCG/ML IJ SOLN: 1000.0000 ug | INTRAMUSCULAR | Status: DC

## 2014-03-27 NOTE — Progress Notes (Signed)
Pre visit review using our clinic review tool, if applicable. No additional management support is needed unless otherwise documented below in the visit note. 

## 2014-03-27 NOTE — Telephone Encounter (Signed)
Done erx 

## 2014-03-27 NOTE — Telephone Encounter (Signed)
Message copied by Biagio Borg on Mon Mar 27, 2014  5:07 PM ------      Message from: Meriam Sprague D      Created: Mon Mar 27, 2014  2:15 PM       Dr. Jenny Reichmann,            Patient's daughter wants to know if you would call in a vial of B 12 and let the home health RN give the injections.  The RN will also teach the daughter so she can do it when pt is discharged.  Just let me know if this is a possibility and I will let the daughter know.            Thanks,      Villa Herb ------

## 2014-03-28 ENCOUNTER — Ambulatory Visit: Payer: Medicare Other | Admitting: Internal Medicine

## 2014-03-28 ENCOUNTER — Telehealth: Payer: Self-pay | Admitting: Internal Medicine

## 2014-03-28 NOTE — Telephone Encounter (Signed)
Lindsay Sheppard with [facility] called to let Dr. Jenny Reichmann know that they have resumed care today following the patient's re-hospitalization.  Her frequency is 2x a week for week 1 and 3x a week for week 2. No call back requested.

## 2014-03-29 ENCOUNTER — Telehealth: Payer: Self-pay

## 2014-03-29 MED ORDER — RISPERIDONE 0.5 MG PO TBDP: 0.5000 mg | ORAL_TABLET | Freq: Every evening | ORAL | Status: DC

## 2014-03-29 MED ORDER — GLUCOSE BLOOD VI STRP: ORAL_STRIP | Status: DC

## 2014-03-29 NOTE — Telephone Encounter (Signed)
Apparently the risperdal was felt to be a medication to continue after hosp d/c, as it is on her d/c med list.    I did erx.

## 2014-03-29 NOTE — Telephone Encounter (Signed)
Called [facility] Pharmacist left a detailed message of prescriptions sent in to the pharmacy.

## 2014-03-29 NOTE — Telephone Encounter (Signed)
[facility] Pharmacist reviewed/updated patients medication list from hospital.  She was given risperdal in the hospital does need a refill if PCP wants her to continue on.  Also stopped taking allegra and norvasc.  Will send in correct test strips requested by family, Accu chek compact plus.  Advise on request for Risperdal refill or OV?

## 2014-03-30 ENCOUNTER — Other Ambulatory Visit: Payer: Self-pay

## 2014-03-30 MED ORDER — GLUCOSE BLOOD VI STRP: ORAL_STRIP | Status: DC

## 2014-04-04 ENCOUNTER — Ambulatory Visit (INDEPENDENT_AMBULATORY_CARE_PROVIDER_SITE_OTHER): Payer: Medicare Other | Admitting: Family Medicine

## 2014-04-04 DIAGNOSIS — Z5181 Encounter for therapeutic drug level monitoring: Secondary | ICD-10-CM

## 2014-04-04 LAB — POCT INR: INR: 2

## 2014-04-10 ENCOUNTER — Ambulatory Visit (INDEPENDENT_AMBULATORY_CARE_PROVIDER_SITE_OTHER): Payer: Medicare Other | Admitting: Family Medicine

## 2014-04-10 DIAGNOSIS — Z5181 Encounter for therapeutic drug level monitoring: Secondary | ICD-10-CM

## 2014-04-10 LAB — POCT INR: INR: 2.7

## 2014-04-24 ENCOUNTER — Other Ambulatory Visit: Payer: Self-pay | Admitting: Internal Medicine

## 2014-04-24 ENCOUNTER — Ambulatory Visit (INDEPENDENT_AMBULATORY_CARE_PROVIDER_SITE_OTHER): Payer: Medicare Other | Admitting: *Deleted

## 2014-04-24 DIAGNOSIS — Z5181 Encounter for therapeutic drug level monitoring: Secondary | ICD-10-CM

## 2014-04-24 LAB — POCT INR: INR: 2.3

## 2014-04-26 DIAGNOSIS — R404 Transient alteration of awareness: Secondary | ICD-10-CM

## 2014-04-26 DIAGNOSIS — I509 Heart failure, unspecified: Secondary | ICD-10-CM

## 2014-04-26 DIAGNOSIS — E119 Type 2 diabetes mellitus without complications: Secondary | ICD-10-CM

## 2014-04-26 DIAGNOSIS — I5032 Chronic diastolic (congestive) heart failure: Secondary | ICD-10-CM

## 2014-05-09 ENCOUNTER — Ambulatory Visit (INDEPENDENT_AMBULATORY_CARE_PROVIDER_SITE_OTHER): Payer: Medicare Other | Admitting: *Deleted

## 2014-05-09 DIAGNOSIS — Z5181 Encounter for therapeutic drug level monitoring: Secondary | ICD-10-CM

## 2014-05-09 LAB — POCT INR: INR: 2

## 2014-05-15 ENCOUNTER — Other Ambulatory Visit: Payer: Self-pay | Admitting: *Deleted

## 2014-05-15 MED ORDER — WARFARIN SODIUM 2 MG PO TABS: 2.0000 mg | ORAL_TABLET | Freq: Every day | ORAL | Status: DC

## 2014-05-15 MED ORDER — WARFARIN SODIUM 2 MG PO TABS: 3.0000 mg | ORAL_TABLET | Freq: Every day | ORAL | Status: DC

## 2014-05-17 ENCOUNTER — Other Ambulatory Visit: Payer: Self-pay | Admitting: *Deleted

## 2014-05-17 MED ORDER — WARFARIN SODIUM 2 MG PO TABS: 2.0000 mg | ORAL_TABLET | Freq: Every day | ORAL | Status: DC

## 2014-05-22 ENCOUNTER — Ambulatory Visit (INDEPENDENT_AMBULATORY_CARE_PROVIDER_SITE_OTHER): Payer: Medicare Other | Admitting: *Deleted

## 2014-05-22 DIAGNOSIS — Z5181 Encounter for therapeutic drug level monitoring: Secondary | ICD-10-CM

## 2014-05-22 LAB — POCT INR: INR: 2.5

## 2014-05-30 ENCOUNTER — Other Ambulatory Visit (INDEPENDENT_AMBULATORY_CARE_PROVIDER_SITE_OTHER): Payer: Medicare Other

## 2014-05-30 ENCOUNTER — Encounter: Payer: Self-pay | Admitting: Nurse Practitioner

## 2014-05-30 ENCOUNTER — Ambulatory Visit (INDEPENDENT_AMBULATORY_CARE_PROVIDER_SITE_OTHER): Payer: Medicare Other | Admitting: Nurse Practitioner

## 2014-05-30 VITALS — BP 138/80 | HR 98 | Temp 98.0°F | Wt 120.0 lb

## 2014-05-30 DIAGNOSIS — E119 Type 2 diabetes mellitus without complications: Secondary | ICD-10-CM

## 2014-05-30 DIAGNOSIS — R404 Transient alteration of awareness: Secondary | ICD-10-CM

## 2014-05-30 DIAGNOSIS — Z23 Encounter for immunization: Secondary | ICD-10-CM

## 2014-05-30 DIAGNOSIS — E538 Deficiency of other specified B group vitamins: Secondary | ICD-10-CM

## 2014-05-30 DIAGNOSIS — R410 Disorientation, unspecified: Secondary | ICD-10-CM

## 2014-05-30 DIAGNOSIS — I6350 Cerebral infarction due to unspecified occlusion or stenosis of unspecified cerebral artery: Secondary | ICD-10-CM

## 2014-05-30 LAB — HEMOGLOBIN A1C: Hgb A1c MFr Bld: 7.5 % — ABNORMAL HIGH (ref 4.6–6.5)

## 2014-05-30 MED ORDER — CYANOCOBALAMIN 1000 MCG/ML IJ SOLN
1000.0000 ug | Freq: Once | INTRAMUSCULAR | Status: AC
  Administered 2014-05-30: 1000 ug via INTRAMUSCULAR

## 2014-05-30 NOTE — Assessment & Plan Note (Signed)
Daughters report patient with increased appetitie on Respiradol.  Requesting patient to be changed to alternative that wont influence appetitie. Will get advice from Dr. Jenny Reichmann regarding possible change.

## 2014-05-30 NOTE — Progress Notes (Signed)
Pre visit review using our clinic review tool, if applicable. No additional management support is needed unless otherwise documented below in the visit note. 

## 2014-05-30 NOTE — Assessment & Plan Note (Signed)
Blood glucose elevated in AM.  9/18 to present FBG 152-250 .   Daughters have now increased Lantus to 23 units daily beginning 9/28  FBG  Improving.  Patient has previously been on Metformin but this was stopped at 6/ 15 hospitalization.  Check Hba1c today.

## 2014-05-30 NOTE — Progress Notes (Signed)
Subjective:    Patient ID: Lindsay Sheppard, female    DOB: 29-Oct-1920, 80 y.o.   MRN: [id number]  HPI  Patient is seen for evaluation of diabetes. Edema, acute delirium.  Daughters present for history and exam.  Patient in wheelchair.  Patient denies any pain except for ongoing left foot toe pain.  Patient answers simple questions appropriately.  Denies chest pain, pressure or palpitations. No shortness of breath or cough. Denies abdominal pain or constipation. No nausea or vomiting.  Denies any urinary symptoms of dysuria, or frequency. Daughters report concern regarding FBG elevated, 116-265 since mid September.  Daughter increased Lantus to 23 units a few days ago.  FBG 88-165.  Daughters are  Concerned Risperdol has increased patient's appetite and are requesting patient be changed to another medication.   Patient has required Lasix 20 mg po intermittently for edema in lower legs.     Review of Systems  Constitutional: Negative.   HENT: Negative.   Respiratory: Negative.  Negative for shortness of breath and wheezing.   Cardiovascular: Negative.  Negative for chest pain, palpitations and leg swelling.  Gastrointestinal: Negative.  Negative for nausea, vomiting, diarrhea and constipation.  Genitourinary: Negative for dysuria, urgency and frequency.  Psychiatric/Behavioral: Negative for agitation.    Past Medical History  Diagnosis Date  . POSTHERPETIC NEURALGIA 11/02/2009  . B12 DEFICIENCY 05/23/2007  . HYPERLIPIDEMIA 05/23/2007  . HYPERKALEMIA 05/30/2010  . ANEMIA-IRON DEFICIENCY 01/26/2007  . BLEPHARITIS, BILATERAL 02/23/2008  . Other specified forms of hearing loss 05/30/2010  . HYPERTENSION 01/26/2007  . Atrial fibrillation 05/23/2007  . DIASTOLIC HEART FAILURE, CHRONIC 12/14/2009  . POSTURAL HYPOTENSION 02/07/2008  . ALLERGIC RHINITIS 11/02/2009  . GERD 01/26/2007  . CONSTIPATION 01/25/2008  . SKIN LESION 05/30/2010  . BACK PAIN 08/02/2007  . OSTEOPOROSIS 05/23/2007  . SYNCOPE 02/07/2008    . FATIGUE 09/06/2007  . Dysuria 09/05/2008  . Abdominal pain, epigastric 09/06/2007  . EPIGASTRIC TENDERNESS 10/12/2007  . PULMONARY EMBOLISM, HX OF 05/23/2007  . SHINGLES, HX OF 05/23/2007  . 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 Left foot pain   . Diabetic neuropathy   . RENAL INSUFFICIENCY 07/02/2009  . RENAL CYST, LEFT 09/06/2007  . Kidney stones     "never had OR"  . Family history of anesthesia complication     "daughter has PONV; another daughter a little anesthesia lasts way too long"  . CHF (congestive heart failure) 2011  . Myocardial infarction 2011    "mild"  . DVT (deep venous thrombosis) ~ 1964    "think it was in the left"  . Aspiration pneumonia ~ 2006    "due to aspiration"  . DIABETES MELLITUS, TYPE II 09/06/2007  . DEPRESSIVE DISORDER 01/25/2008    "@ times; never treated for it"  . Melanoma of nose   . Compression fracture of lumbar vertebra     hx  . Compression fracture of thoracic vertebra 02/07/2014    "fell this am" (02/07/2014  . Arthritis     "hands" (03/15/2014)    History   Social History  . Marital Status: Widowed    Spouse Name: N/A    Number of Children: 109  . Years of Education: N/A   Occupational History  . Retired    Social History Main Topics  . Smoking status: Never Smoker   . Smokeless tobacco: Never Used  . Alcohol Use: No  . Drug Use: No  . Sexual Activity: No   Other Topics Concern  . Not  on file   Social History Narrative  . No narrative on file    Past Surgical History  Procedure Laterality Date  . Appendectomy  1943  . Abdominal hysterectomy  1964  . Cardiac electrophysiology mapping and ablation  1999  . Cataract extraction w/ intraocular lens  implant, bilateral  2003-2005  . Carpal tunnel release Right 2004  . Mohs surgery  2011    "removed off her nose"  . Cardiac catheterization  10/2009  . Esophageal dilation  X 2    Family History  Problem Relation Age of Onset  . Breast cancer Mother   . Colon cancer Father   .  Stroke Father     died with stroke postop  . Diabetes Sister     2 sisters  . Hypertension Other     Allergies  Allergen Reactions  . Amiodarone Other (See Comments)    REACTION: f? fluid retention  . Deltasone [Prednisone] Other (See Comments)    unknown  . Diltiazem Hcl Other (See Comments)    unknown  . Levofloxacin Nausea And Vomiting    REACTION: nausea  . Pregabalin Other (See Comments)    REACTION: hallucinations  . Spironolactone Other (See Comments)    REACTION: elevated K at lowest dose  . Tequin [Gatifloxacin] Other (See Comments)    Hypoglycemia  . Diazepam Anxiety and Other (See Comments)    REACTION: agitation    Current Outpatient Prescriptions on File Prior to Visit  Medication Sig Dispense Refill  . acetaminophen (TYLENOL) 325 MG tablet Take 2 tablets (650 mg total) by mouth 3 (three) times daily.      Marland Kitchen amLODipine (NORVASC) 5 MG tablet Take 1 tablet (5 mg total) by mouth daily.  30 tablet  0  . cloNIDine (CATAPRES - DOSED IN MG/24 HR) 0.3 mg/24hr patch Place 0.3 mg onto the skin once a week.      Marland Kitchen CRANBERRY PO Take 1 capsule by mouth daily.       . cyanocobalamin (,VITAMIN B-12,) 1000 MCG/ML injection INJECT 1 ML (1000 MCG) INTO THE MUSCLE EVERY 30 DAYS.  1 mL  11  . ferrous sulfate 325 (65 FE) MG tablet Take 1 tablet (325 mg total) by mouth 2 (two) times daily with a meal.  60 tablet  1  . fexofenadine (ALLEGRA) 180 MG tablet Take 180 mg by mouth daily.       . fluticasone (FLONASE) 50 MCG/ACT nasal spray Place 2 sprays into the nose daily.  16 g  2  . glucose blood (ACCU-CHEK COMPACT PLUS) test strip Use as directed twice daily to check blood sugar.  Diagnosis code 250.00  102 each  11  . Hypromellose (ARTIFICIAL TEARS OP) Place 1 drop into both eyes daily.      . insulin glargine (LANTUS) 100 UNIT/ML injection Inject 21 Units into the skin daily.      Marland Kitchen labetalol (NORMODYNE) 200 MG tablet Take 400 mg by mouth 2 (two) times daily.      . pantoprazole  (PROTONIX) 40 MG tablet Take 1 tablet (40 mg total) by mouth daily.  90 tablet  3  . polyethylene glycol (MIRALAX / GLYCOLAX) packet Take 17 g by mouth 2 (two) times daily.  14 each  0  . pravastatin (PRAVACHOL) 40 MG tablet Take 20 mg by mouth daily. 1/2 daily      . risperiDONE (RISPERDAL M-TABS) 0.5 MG disintegrating tablet Take 1 tablet (0.5 mg total) by mouth every evening.  [place]  tablet  5  . warfarin (COUMADIN) 2 MG tablet Take 1 tablet (2 mg total) by mouth daily. 3mg  every day except on Sunday. Sunday do not take any.  90 tablet  0   No current facility-administered medications on file prior to visit.    BP 138/80  Pulse 98  Temp(Src) 98 F (36.7 C) (Oral)  Wt 120 lb (54.432 kg)  SpO2 98%         Objective:   Physical Exam  Constitutional: She is oriented to person, place, and time. She appears well-developed and well-nourished.  Patient in wheelchair for exam  HENT:  Head: Normocephalic.  Cardiovascular: Normal rate.   Pulmonary/Chest: Effort normal and breath sounds normal. No respiratory distress. She has no wheezes. She has no rales.  Abdominal: Soft. She exhibits no distension.  Neurological: She is alert and oriented to person, place, and time.  Skin: Skin is warm and dry.  Psychiatric: She has a normal mood and affect. Her behavior is normal. Thought content normal.          Assessment & Plan:  1. B12 DEFICIENCY - cyanocobalamin ((VITAMIN B-12)) injection 1,000 mcg; Inject 1 mL (1,000 mcg total) into the muscle once.  2. Need for prophylactic vaccination and inoculation against influenza  - Flu Vaccine QUAD 36+ mos PF IM (Fluarix Quad PF)  3. DIABETES MELLITUS, TYPE II  - HgB A1c; Future today.  Continue Lantus 23 units daily  4. Acute delirium Risperdal to be continued for now.  Will consult Dr. Jenny Reichmann to advise regarding possible change of medication.

## 2014-06-01 ENCOUNTER — Telehealth: Payer: Self-pay

## 2014-06-01 NOTE — Telephone Encounter (Signed)
Fax from [facility] requesting a refill on Furosemide but do not see on current list.

## 2014-06-01 NOTE — Telephone Encounter (Signed)
As per last d/c summary July 2015, pt no longer taking lasix, no refill needed

## 2014-06-02 NOTE — Telephone Encounter (Signed)
Pharmacy informed.

## 2014-06-06 ENCOUNTER — Ambulatory Visit (INDEPENDENT_AMBULATORY_CARE_PROVIDER_SITE_OTHER): Payer: Medicare Other | Admitting: *Deleted

## 2014-06-06 DIAGNOSIS — Z5181 Encounter for therapeutic drug level monitoring: Secondary | ICD-10-CM

## 2014-06-06 LAB — POCT INR: INR: 3

## 2014-06-19 ENCOUNTER — Ambulatory Visit (INDEPENDENT_AMBULATORY_CARE_PROVIDER_SITE_OTHER): Payer: Medicare Other | Admitting: *Deleted

## 2014-06-19 DIAGNOSIS — Z5181 Encounter for therapeutic drug level monitoring: Secondary | ICD-10-CM

## 2014-06-19 LAB — POCT INR: INR: 2.1

## 2014-06-27 ENCOUNTER — Telehealth: Payer: Self-pay | Admitting: Internal Medicine

## 2014-06-27 NOTE — Telephone Encounter (Signed)
Wants to recert for L07 injections once a month with INR in between.  Needs diagnosis as to why patient needs B12.  Can fax to [phone]

## 2014-06-27 NOTE — Telephone Encounter (Signed)
Vitamin B-12 deficiency

## 2014-06-28 NOTE — Telephone Encounter (Signed)
Notified [name] with md response, also fax to office # below...Lindsay Sheppard

## 2014-07-03 DIAGNOSIS — R410 Disorientation, unspecified: Secondary | ICD-10-CM

## 2014-07-03 DIAGNOSIS — E119 Type 2 diabetes mellitus without complications: Secondary | ICD-10-CM

## 2014-07-03 DIAGNOSIS — I129 Hypertensive chronic kidney disease with stage 1 through stage 4 chronic kidney disease, or unspecified chronic kidney disease: Secondary | ICD-10-CM

## 2014-07-03 DIAGNOSIS — I5032 Chronic diastolic (congestive) heart failure: Secondary | ICD-10-CM

## 2014-07-12 ENCOUNTER — Other Ambulatory Visit: Payer: Self-pay | Admitting: Internal Medicine

## 2014-07-12 DIAGNOSIS — I2699 Other pulmonary embolism without acute cor pulmonale: Secondary | ICD-10-CM

## 2014-07-12 DIAGNOSIS — T81718A Complication of other artery following a procedure, not elsewhere classified, initial encounter: Principal | ICD-10-CM

## 2014-07-13 ENCOUNTER — Other Ambulatory Visit: Payer: Self-pay | Admitting: Internal Medicine

## 2014-07-13 ENCOUNTER — Telehealth: Payer: Self-pay | Admitting: Certified Registered Nurse Anesthetist

## 2014-07-13 LAB — POCT INR: INR: 2.3

## 2014-07-13 NOTE — Telephone Encounter (Signed)
Spoke with [name] with [facility].  INR result 2.3  from 11/2 was resulted into epic.  Also spoke with patients daughter.  Patiens INR within range and no changes to medication recommended at this time.  [name] will recheck INR on Monday and report results back at that time.

## 2014-07-13 NOTE — Telephone Encounter (Signed)
[name] with [facility] reports pt INR of 2.3 on 11-2.  Will recheck on Monday which will be two weeks.  Coumadin Refilled

## 2014-07-17 ENCOUNTER — Ambulatory Visit: Payer: Self-pay | Admitting: Certified Registered Nurse Anesthetist

## 2014-07-17 DIAGNOSIS — Z5181 Encounter for therapeutic drug level monitoring: Secondary | ICD-10-CM

## 2014-07-17 DIAGNOSIS — I482 Chronic atrial fibrillation, unspecified: Secondary | ICD-10-CM

## 2014-07-17 LAB — POCT INR: INR: 2.8

## 2014-07-17 NOTE — Patient Instructions (Signed)
Continue to take 1 1/2 tablet (3 mg) every day except 1/2 (1 mg) tablet on Sundays. Re-check in 3 weeks. Dosing instructions given to Shayne Alken RN @ [facility] [employer] [facility].

## 2014-07-25 ENCOUNTER — Other Ambulatory Visit: Payer: Self-pay | Admitting: Internal Medicine

## 2014-08-07 ENCOUNTER — Ambulatory Visit: Payer: Medicare Other | Admitting: Certified Registered Nurse Anesthetist

## 2014-08-07 DIAGNOSIS — Z5181 Encounter for therapeutic drug level monitoring: Secondary | ICD-10-CM

## 2014-08-07 DIAGNOSIS — I482 Chronic atrial fibrillation, unspecified: Secondary | ICD-10-CM

## 2014-08-07 LAB — POCT INR: INR: 4.2

## 2014-08-08 ENCOUNTER — Other Ambulatory Visit (INDEPENDENT_AMBULATORY_CARE_PROVIDER_SITE_OTHER): Payer: Medicare Other

## 2014-08-08 ENCOUNTER — Encounter: Payer: Self-pay | Admitting: Internal Medicine

## 2014-08-08 ENCOUNTER — Ambulatory Visit (INDEPENDENT_AMBULATORY_CARE_PROVIDER_SITE_OTHER): Payer: Medicare Other | Admitting: Internal Medicine

## 2014-08-08 ENCOUNTER — Other Ambulatory Visit: Payer: Self-pay | Admitting: Internal Medicine

## 2014-08-08 VITALS — BP 110/60 | HR 68 | Temp 97.8°F | Ht 66.0 in | Wt 115.0 lb

## 2014-08-08 DIAGNOSIS — F0390 Unspecified dementia without behavioral disturbance: Secondary | ICD-10-CM | POA: Insufficient documentation

## 2014-08-08 DIAGNOSIS — E119 Type 2 diabetes mellitus without complications: Secondary | ICD-10-CM

## 2014-08-08 DIAGNOSIS — R351 Nocturia: Secondary | ICD-10-CM

## 2014-08-08 DIAGNOSIS — I10 Essential (primary) hypertension: Secondary | ICD-10-CM

## 2014-08-08 DIAGNOSIS — I639 Cerebral infarction, unspecified: Secondary | ICD-10-CM

## 2014-08-08 LAB — CBC WITH DIFFERENTIAL/PLATELET
BASOS PCT: 0.3 % (ref 0.0–3.0)
Basophils Absolute: 0 10*3/uL (ref 0.0–0.1)
EOS ABS: 0 10*3/uL (ref 0.0–0.7)
EOS PCT: 0.9 % (ref 0.0–5.0)
HEMATOCRIT: 38.8 % (ref 36.0–46.0)
Hemoglobin: 12.7 g/dL (ref 12.0–15.0)
LYMPHS ABS: 0.7 10*3/uL (ref 0.7–4.0)
Lymphocytes Relative: 15.1 % (ref 12.0–46.0)
MCHC: 32.8 g/dL (ref 30.0–36.0)
MCV: 85.8 fl (ref 78.0–100.0)
MONO ABS: 0.4 10*3/uL (ref 0.1–1.0)
Monocytes Relative: 7.9 % (ref 3.0–12.0)
NEUTROS PCT: 75.8 % (ref 43.0–77.0)
Neutro Abs: 3.7 10*3/uL (ref 1.4–7.7)
Platelets: 166 10*3/uL (ref 150.0–400.0)
RBC: 4.52 Mil/uL (ref 3.87–5.11)
RDW: 17.5 % — ABNORMAL HIGH (ref 11.5–15.5)
WBC: 4.9 10*3/uL (ref 4.0–10.5)

## 2014-08-08 LAB — HEPATIC FUNCTION PANEL
ALT: 18 U/L (ref 0–35)
AST: 16 U/L (ref 0–37)
Albumin: 3.6 g/dL (ref 3.5–5.2)
Alkaline Phosphatase: 55 U/L (ref 39–117)
BILIRUBIN DIRECT: 0.1 mg/dL (ref 0.0–0.3)
BILIRUBIN TOTAL: 0.6 mg/dL (ref 0.2–1.2)
Total Protein: 5.8 g/dL — ABNORMAL LOW (ref 6.0–8.3)

## 2014-08-08 LAB — URINALYSIS, ROUTINE W REFLEX MICROSCOPIC
BILIRUBIN URINE: NEGATIVE
Ketones, ur: NEGATIVE
Nitrite: NEGATIVE
Specific Gravity, Urine: 1.025 (ref 1.000–1.030)
TOTAL PROTEIN, URINE-UPE24: 30 — AB
Urine Glucose: NEGATIVE
Urobilinogen, UA: 0.2 (ref 0.0–1.0)
pH: 6 (ref 5.0–8.0)

## 2014-08-08 LAB — BASIC METABOLIC PANEL
BUN: 32 mg/dL — ABNORMAL HIGH (ref 6–23)
CALCIUM: 9.6 mg/dL (ref 8.4–10.5)
CO2: 27 mEq/L (ref 19–32)
CREATININE: 1.2 mg/dL (ref 0.4–1.2)
Chloride: 105 mEq/L (ref 96–112)
GFR: 46.53 mL/min — AB (ref >60.00)
Glucose, Bld: 106 mg/dL — ABNORMAL HIGH (ref 70–99)
Potassium: 4.3 mEq/L (ref 3.5–5.1)
Sodium: 139 mEq/L (ref 135–145)

## 2014-08-08 LAB — LIPID PANEL
CHOL/HDL RATIO: 3
Cholesterol: 123 mg/dL (ref 0–200)
HDL: 39.7 mg/dL (ref >39.00)
LDL Cholesterol: 70 mg/dL (ref 0–99)
NONHDL: 83.3
Triglycerides: 68 mg/dL (ref 0.0–149.0)
VLDL: 13.6 mg/dL (ref 0.0–40.0)

## 2014-08-08 LAB — HEMOGLOBIN A1C: Hgb A1c MFr Bld: 7 % — ABNORMAL HIGH (ref 4.6–6.5)

## 2014-08-08 LAB — TSH: TSH: 1.82 u[IU]/mL (ref 0.35–4.50)

## 2014-08-08 MED ORDER — DONEPEZIL HCL 5 MG PO TABS: 5.0000 mg | ORAL_TABLET | Freq: Every day | ORAL | Status: DC

## 2014-08-08 MED ORDER — CEPHALEXIN 500 MG PO CAPS: 500.0000 mg | ORAL_CAPSULE | Freq: Four times a day (QID) | ORAL | Status: DC

## 2014-08-08 MED ORDER — DONEPEZIL HCL 10 MG PO TABS: 10.0000 mg | ORAL_TABLET | Freq: Every day | ORAL | Status: DC

## 2014-08-08 NOTE — Assessment & Plan Note (Signed)
O/w asympt, for UA today,  to f/u any worsening symptoms or concerns

## 2014-08-08 NOTE — Patient Instructions (Addendum)
Please take all new medication as prescribed- the aricept at 5 mg for one month, then 10 mg per day after that  Please continue all other medications as before, and refills have been done if requested.  Please have the pharmacy call with any other refills you may need.  Please keep your appointments with your specialists as you may have planned  Please go to the LAB in the Basement (turn left off the elevator) for the tests to be done today  You will be contacted by phone if any changes need to be made immediately.  Otherwise, you will receive a letter about your results with an explanation, but please check with MyChart first.  Please remember to sign up for MyChart if you have not done so, as this will be important to you in the future with finding out test results, communicating by private email, and scheduling acute appointments online when needed.  Please return in 3 months, or sooner if needed

## 2014-08-08 NOTE — Progress Notes (Signed)
Pre visit review using our clinic review tool, if applicable. No additional management support is needed unless otherwise documented below in the visit note. 

## 2014-08-08 NOTE — Assessment & Plan Note (Signed)
Recent worsening, for aricept 5 qd, delcines other MRI or other eval at this time

## 2014-08-08 NOTE — Assessment & Plan Note (Signed)
stable overall by history and exam, recent data reviewed with pt, and pt to continue medical treatment as before,  to f/u any worsening symptoms or concerns BP Readings from Last 3 Encounters:  08/08/14 110/60  05/30/14 138/80  03/24/14 126/66

## 2014-08-08 NOTE — Progress Notes (Signed)
Subjective:    Patient ID: Lindsay Sheppard, female    DOB: 19-Jan-1921, 80 y.o.   MRN: [id number]  HPI  Here to f/u, o verall with ongoing physcial and mental decline it seems since fall in June 2015; [facility] taking care of b12 sand coumadin, gets 24/7coverage and requires 100% assist with ADL's; uses walker mostly at home to walk room to room, but today is first day out of the home in several months.  No recent falls except a minor fall in [place] without injury about 6 wks ago.   Wt Readings from Last 3 Encounters:  08/08/14 115 lb (52.164 kg)  05/30/14 120 lb (54.432 kg)  03/22/14 124 lb 12.5 oz (56.6 kg)  Has lost some wt.  Does have several wks ongoing nasal allergy symptoms with clearish congestion, itch and sneezing, without fever, pain, ST, cough, swelling or wheezing, taken off allegra with last hospn due to concern about worsening confusion, but family states ok off this for now, since has nasal spray per ent. No other signficant complaints.   Pt denies fever, night sweats, loss of appetite, or other constitutional symptoms.  Daughter asks for f/u a1c , cbg's have been 35 - 121 wit hsomeaht variable diet. No overt bleeding or bruising, last INR 4.2  - being adjusted per coumadin clinic.  Has nocturia x 3-5 times - needs UA, now off chronic antibx she had taken previously. Family stopped her risperdal as her confusion and agiation seemed to somewhat improved, parnoia resolved.   Past Medical History  Diagnosis Date  . POSTHERPETIC NEURALGIA 11/02/2009  . B12 DEFICIENCY 05/23/2007  . HYPERLIPIDEMIA 05/23/2007  . HYPERKALEMIA 05/30/2010  . ANEMIA-IRON DEFICIENCY 01/26/2007  . BLEPHARITIS, BILATERAL 02/23/2008  . Other specified forms of hearing loss 05/30/2010  . HYPERTENSION 01/26/2007  . Atrial fibrillation 05/23/2007  . DIASTOLIC HEART FAILURE, CHRONIC 12/14/2009  . POSTURAL HYPOTENSION 02/07/2008  . ALLERGIC RHINITIS 11/02/2009  . GERD 01/26/2007  . CONSTIPATION 01/25/2008  . SKIN LESION 05/30/2010  .  BACK PAIN 08/02/2007  . OSTEOPOROSIS 05/23/2007  . SYNCOPE 02/07/2008  . FATIGUE 09/06/2007  . Dysuria 09/05/2008  . Abdominal pain, epigastric 09/06/2007  . EPIGASTRIC TENDERNESS 10/12/2007  . PULMONARY EMBOLISM, HX OF 05/23/2007  . SHINGLES, HX OF 05/23/2007  . 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 Left foot pain   . Diabetic neuropathy   . RENAL INSUFFICIENCY 07/02/2009  . RENAL CYST, LEFT 09/06/2007  . Kidney stones     "never had OR"  . Family history of anesthesia complication     "daughter has PONV; another daughter a little anesthesia lasts way too long"  . CHF (congestive heart failure) 2011  . Myocardial infarction 2011    "mild"  . DVT (deep venous thrombosis) ~ 1964    "think it was in the left"  . Aspiration pneumonia ~ 2006    "due to aspiration"  . DIABETES MELLITUS, TYPE II 09/06/2007  . DEPRESSIVE DISORDER 01/25/2008    "@ times; never treated for it"  . Melanoma of nose   . Compression fracture of lumbar vertebra     hx  . Compression fracture of thoracic vertebra 02/07/2014    "fell this am" (02/07/2014  . Arthritis     "hands" (03/15/2014)   Past Surgical History  Procedure Laterality Date  . Appendectomy  1943  . Abdominal hysterectomy  1964  . Cardiac electrophysiology mapping and ablation  1999  . Cataract extraction w/ intraocular lens  implant, bilateral  2003-2005  . Carpal tunnel release Right 2004  . Mohs surgery  2011    "removed off her nose"  . Cardiac catheterization  10/2009  . Esophageal dilation  X 2    reports that she has never smoked. She has never used smokeless tobacco. She reports that she does not drink alcohol or use illicit drugs. family history includes Breast cancer in her mother; Colon cancer in her father; Diabetes in her sister; Hypertension in her other; Stroke in her father. Allergies  Allergen Reactions  . Amiodarone Other (See Comments)    REACTION: f? fluid retention  . Deltasone [Prednisone] Other (See Comments)    unknown  .  Diltiazem Hcl Other (See Comments)    unknown  . Levofloxacin Nausea And Vomiting    REACTION: nausea  . Pregabalin Other (See Comments)    REACTION: hallucinations  . Spironolactone Other (See Comments)    REACTION: elevated K at lowest dose  . Tequin [Gatifloxacin] Other (See Comments)    Hypoglycemia  . Diazepam Anxiety and Other (See Comments)    REACTION: agitation   Current Outpatient Prescriptions on File Prior to Visit  Medication Sig Dispense Refill  . acetaminophen (TYLENOL) 325 MG tablet Take 2 tablets (650 mg total) by mouth 3 (three) times daily.    . B-D INS SYRINGE 0.5CC/31GX5/16 31G X 5/16" 0.5 ML MISC USE DAILY AS INSTRUCTED 100 each 11  . cloNIDine (CATAPRES - DOSED IN MG/24 HR) 0.3 mg/24hr patch Place 0.3 mg onto the skin once a week.    Marland Kitchen CRANBERRY PO Take 500 mg by mouth daily.     . cyanocobalamin (,VITAMIN B-12,) 1000 MCG/ML injection INJECT 1 ML (1000 MCG) INTO THE MUSCLE EVERY 30 DAYS. 1 mL 11  . ferrous sulfate 325 (65 FE) MG tablet Take 1 tablet (325 mg total) by mouth 2 (two) times daily with a meal. 60 tablet 1  . fluticasone (FLONASE) 50 MCG/ACT nasal spray Place 2 sprays into the nose daily. 16 g 2  . glucose blood (ACCU-CHEK COMPACT PLUS) test strip Use as directed twice daily to check blood sugar.  Diagnosis code 250.00 102 each 11  . Hypromellose (ARTIFICIAL TEARS OP) Place 1 drop into both eyes daily.    . insulin glargine (LANTUS) 100 UNIT/ML injection Inject 21 Units into the skin daily.    Marland Kitchen labetalol (NORMODYNE) 200 MG tablet Take 400 mg by mouth 2 (two) times daily.    . pantoprazole (PROTONIX) 40 MG tablet Take 1 tablet (40 mg total) by mouth daily. 90 tablet 3  . polyethylene glycol (MIRALAX / GLYCOLAX) packet Take 17 g by mouth 2 (two) times daily. 14 each 0  . pravastatin (PRAVACHOL) 40 MG tablet Take 20 mg by mouth daily. 1/2 daily    . warfarin (COUMADIN) 2 MG tablet Follow instructions per coumadin clinic 90 tablet 0   No current  facility-administered medications on file prior to visit.   Review of Systems  Constitutional: Negative for unusual diaphoresis or other sweats  HENT: Negative for ringing in ear Eyes: Negative for double vision or worsening visual disturbance.  Respiratory: Negative for choking and stridor.   Gastrointestinal: Negative for vomiting or other signifcant bowel change Genitourinary: Negative for hematuria or decreased urine volume.  Musculoskeletal: Negative for other MSK pain or swelling Skin: Negative for color change and worsening wound.  Neurological: Negative for tremors and numbness other than noted  Psychiatric/Behavioral: Negative for decreased concentration or agitation other than above  Objective:   Physical Exam BP 110/60 mmHg  Pulse 68  Temp(Src) 97.8 F (36.6 C) (Oral)  Ht 5\' 6"  (1.676 m)  Wt 115 lb (52.164 kg)  BMI 18.57 kg/m2  SpO2 97% VS noted,  Constitutional: Pt appears thinner.  HENT: Head: NCAT.  Right Ear: External ear normal.  Left Ear: External ear normal.  Eyes: . Pupils are equal, round, and reactive to light. Conjunctivae and EOM are normal Neck: Normal range of motion. Neck supple.  Cardiovascular: Normal rate and irregular rhythm.   Pulmonary/Chest: Effort normal and breath sounds normal.  Abd:  Soft, NT, ND, + BS Neurological: Pt is alert. + confused , motor grossly intact Skin: Skin is warm. No rash Psychiatric: Pt behavior is normal. No agitation.     Assessment & Plan:

## 2014-08-08 NOTE — Assessment & Plan Note (Signed)
stable overall by history and exam, recent data reviewed with pt, and pt to continue medical treatment as before,  to f/u any worsening symptoms or concerns Lab Results  Component Value Date   HGBA1C 7.5* 05/30/2014

## 2014-08-09 ENCOUNTER — Telehealth: Payer: Self-pay | Admitting: *Deleted

## 2014-08-09 NOTE — Telephone Encounter (Signed)
No, should be fine, thanks

## 2014-08-09 NOTE — Telephone Encounter (Signed)
Patient's daughter Lindsay Sheppard wanted to know if antibiotic Keflex will effect pt's coumadin?

## 2014-08-10 NOTE — Telephone Encounter (Signed)
Bonnie notified

## 2014-08-14 ENCOUNTER — Telehealth: Payer: Self-pay | Admitting: Family

## 2014-08-14 ENCOUNTER — Ambulatory Visit: Payer: Self-pay | Admitting: Certified Registered Nurse Anesthetist

## 2014-08-14 DIAGNOSIS — I482 Chronic atrial fibrillation, unspecified: Secondary | ICD-10-CM

## 2014-08-14 DIAGNOSIS — Z5181 Encounter for therapeutic drug level monitoring: Secondary | ICD-10-CM

## 2014-08-14 LAB — POCT INR: INR: 1.9

## 2014-08-14 NOTE — Telephone Encounter (Signed)
Agree with plan 

## 2014-08-21 ENCOUNTER — Ambulatory Visit (INDEPENDENT_AMBULATORY_CARE_PROVIDER_SITE_OTHER): Payer: Medicare Other | Admitting: Certified Registered Nurse Anesthetist

## 2014-08-21 ENCOUNTER — Telehealth: Payer: Self-pay | Admitting: Family

## 2014-08-21 DIAGNOSIS — I482 Chronic atrial fibrillation, unspecified: Secondary | ICD-10-CM

## 2014-08-21 DIAGNOSIS — Z5181 Encounter for therapeutic drug level monitoring: Secondary | ICD-10-CM | POA: Diagnosis not present

## 2014-08-21 LAB — POCT INR: INR: 3.3

## 2014-08-21 NOTE — Telephone Encounter (Signed)
Agree with plan 

## 2014-08-23 ENCOUNTER — Telehealth: Payer: Self-pay | Admitting: Internal Medicine

## 2014-08-23 MED ORDER — FLUCONAZOLE 150 MG PO TABS: ORAL_TABLET | ORAL | Status: DC

## 2014-08-23 NOTE — Telephone Encounter (Signed)
Daughter states patient just finished up antibiotics.  Now patient has is complaining of burning and itching.  Daughter thinks patient might have a yeast infection.  She is requesting a script to be sent to [facility] on [address] in [place].  Patients daughter is requesting a call back on her cell.

## 2014-08-23 NOTE — Telephone Encounter (Signed)
Family informed.

## 2014-08-23 NOTE — Telephone Encounter (Signed)
Pharmacist calling to let MD know there is an interaction between warfarin and diflucan. Pt just prescribed difulcan.  [phone] . [place]/[facility]

## 2014-08-23 NOTE — Telephone Encounter (Signed)
Done erx 

## 2014-08-23 NOTE — Telephone Encounter (Signed)
Called pharmacist unable to take msg. Will call back

## 2014-08-23 NOTE — Telephone Encounter (Signed)
Pharmacist informed ok per PCP.

## 2014-08-23 NOTE — Telephone Encounter (Signed)
Ok to take this time since it is only 1 pill

## 2014-08-27 ENCOUNTER — Emergency Department (HOSPITAL_COMMUNITY): Payer: Medicare Other

## 2014-08-27 ENCOUNTER — Encounter (HOSPITAL_COMMUNITY): Payer: Self-pay | Admitting: Emergency Medicine

## 2014-08-27 ENCOUNTER — Inpatient Hospital Stay (HOSPITAL_COMMUNITY)
Admission: EM | Admit: 2014-08-27 | Discharge: 2014-08-29 | DRG: 683 | Disposition: A | Discharge status: Home / Self Care | Payer: Medicare Other | Attending: Family Medicine | Admitting: Family Medicine

## 2014-08-27 DIAGNOSIS — IMO0001 Reserved for inherently not codable concepts without codable children: Secondary | ICD-10-CM

## 2014-08-27 DIAGNOSIS — I129 Hypertensive chronic kidney disease with stage 1 through stage 4 chronic kidney disease, or unspecified chronic kidney disease: Secondary | ICD-10-CM | POA: Diagnosis not present

## 2014-08-27 DIAGNOSIS — Z7901 Long term (current) use of anticoagulants: Secondary | ICD-10-CM

## 2014-08-27 DIAGNOSIS — E785 Hyperlipidemia, unspecified: Secondary | ICD-10-CM | POA: Diagnosis present

## 2014-08-27 DIAGNOSIS — I10 Essential (primary) hypertension: Secondary | ICD-10-CM | POA: Diagnosis present

## 2014-08-27 DIAGNOSIS — E119 Type 2 diabetes mellitus without complications: Secondary | ICD-10-CM

## 2014-08-27 DIAGNOSIS — K219 Gastro-esophageal reflux disease without esophagitis: Secondary | ICD-10-CM | POA: Diagnosis present

## 2014-08-27 DIAGNOSIS — K5900 Constipation, unspecified: Secondary | ICD-10-CM | POA: Diagnosis present

## 2014-08-27 DIAGNOSIS — I482 Chronic atrial fibrillation, unspecified: Secondary | ICD-10-CM | POA: Diagnosis present

## 2014-08-27 DIAGNOSIS — Z86718 Personal history of other venous thrombosis and embolism: Secondary | ICD-10-CM

## 2014-08-27 DIAGNOSIS — D509 Iron deficiency anemia, unspecified: Secondary | ICD-10-CM | POA: Diagnosis present

## 2014-08-27 DIAGNOSIS — I951 Orthostatic hypotension: Secondary | ICD-10-CM | POA: Diagnosis present

## 2014-08-27 DIAGNOSIS — J309 Allergic rhinitis, unspecified: Secondary | ICD-10-CM | POA: Diagnosis present

## 2014-08-27 DIAGNOSIS — N390 Urinary tract infection, site not specified: Secondary | ICD-10-CM | POA: Diagnosis present

## 2014-08-27 DIAGNOSIS — N183 Chronic kidney disease, stage 3 (moderate): Secondary | ICD-10-CM

## 2014-08-27 DIAGNOSIS — N189 Chronic kidney disease, unspecified: Secondary | ICD-10-CM | POA: Diagnosis present

## 2014-08-27 DIAGNOSIS — E875 Hyperkalemia: Secondary | ICD-10-CM | POA: Diagnosis present

## 2014-08-27 DIAGNOSIS — F329 Major depressive disorder, single episode, unspecified: Secondary | ICD-10-CM | POA: Diagnosis present

## 2014-08-27 DIAGNOSIS — R030 Elevated blood-pressure reading, without diagnosis of hypertension: Secondary | ICD-10-CM

## 2014-08-27 DIAGNOSIS — M1990 Unspecified osteoarthritis, unspecified site: Secondary | ICD-10-CM | POA: Diagnosis present

## 2014-08-27 DIAGNOSIS — E538 Deficiency of other specified B group vitamins: Secondary | ICD-10-CM | POA: Diagnosis present

## 2014-08-27 DIAGNOSIS — Z86711 Personal history of pulmonary embolism: Secondary | ICD-10-CM

## 2014-08-27 DIAGNOSIS — R531 Weakness: Secondary | ICD-10-CM

## 2014-08-27 DIAGNOSIS — I5032 Chronic diastolic (congestive) heart failure: Secondary | ICD-10-CM | POA: Diagnosis present

## 2014-08-27 DIAGNOSIS — E1140 Type 2 diabetes mellitus with diabetic neuropathy, unspecified: Secondary | ICD-10-CM | POA: Diagnosis present

## 2014-08-27 LAB — I-STAT CHEM 8, ED
BUN: 26 mg/dL — AB (ref 6–23)
CALCIUM ION: 1.29 mmol/L (ref 1.13–1.30)
CHLORIDE: 103 meq/L (ref 96–112)
Creatinine, Ser: 1 mg/dL (ref 0.50–1.10)
GLUCOSE: 91 mg/dL (ref 70–99)
HCT: 38 % (ref 36.0–46.0)
Hemoglobin: 12.9 g/dL (ref 12.0–15.0)
Potassium: 3.8 mmol/L (ref 3.5–5.1)
Sodium: 141 mmol/L (ref 135–145)
TCO2: 24 mmol/L (ref 0–100)

## 2014-08-27 LAB — PROTIME-INR
INR: 2.29 — ABNORMAL HIGH (ref 0.00–1.49)
PROTHROMBIN TIME: 25.4 s — AB (ref 11.6–15.2)

## 2014-08-27 LAB — URINALYSIS, ROUTINE W REFLEX MICROSCOPIC
Bilirubin Urine: NEGATIVE
GLUCOSE, UA: 100 mg/dL — AB
KETONES UR: NEGATIVE mg/dL
NITRITE: POSITIVE — AB
PROTEIN: 30 mg/dL — AB
Specific Gravity, Urine: 1.012 (ref 1.005–1.030)
Urobilinogen, UA: 0.2 mg/dL (ref 0.0–1.0)
pH: 6 (ref 5.0–8.0)

## 2014-08-27 LAB — CBC WITH DIFFERENTIAL/PLATELET
Basophils Absolute: 0 10*3/uL (ref 0.0–0.1)
Basophils Relative: 0 % (ref 0–1)
EOS PCT: 2 % (ref 0–5)
Eosinophils Absolute: 0.1 10*3/uL (ref 0.0–0.7)
HCT: 38.8 % (ref 36.0–46.0)
Hemoglobin: 12.3 g/dL (ref 12.0–15.0)
LYMPHS ABS: 0.7 10*3/uL (ref 0.7–4.0)
LYMPHS PCT: 16 % (ref 12–46)
MCH: 27.6 pg (ref 26.0–34.0)
MCHC: 31.7 g/dL (ref 30.0–36.0)
MCV: 87.2 fL (ref 78.0–100.0)
MONO ABS: 0.4 10*3/uL (ref 0.1–1.0)
MONOS PCT: 8 % (ref 3–12)
Neutro Abs: 3.2 10*3/uL (ref 1.7–7.7)
Neutrophils Relative %: 74 % (ref 43–77)
Platelets: 141 10*3/uL — ABNORMAL LOW (ref 150–400)
RBC: 4.45 MIL/uL (ref 3.87–5.11)
RDW: 16 % — ABNORMAL HIGH (ref 11.5–15.5)
WBC: 4.3 10*3/uL (ref 4.0–10.5)

## 2014-08-27 LAB — URINE MICROSCOPIC-ADD ON

## 2014-08-27 MED ORDER — LABETALOL HCL 200 MG PO TABS
400.0000 mg | ORAL_TABLET | Freq: Two times a day (BID) | ORAL | Status: DC
  Administered 2014-08-27 – 2014-08-29 (×4): 400 mg via ORAL
  Filled 2014-08-27 (×5): qty 2

## 2014-08-27 MED ORDER — SODIUM CHLORIDE 0.9 % IV SOLN
INTRAVENOUS | Status: DC
  Administered 2014-08-27: 16:00:00 via INTRAVENOUS

## 2014-08-27 MED ORDER — CLONIDINE HCL 0.1 MG PO TABS: 0.1000 mg | ORAL_TABLET | Freq: Once | ORAL | Status: DC

## 2014-08-27 MED ORDER — CLONIDINE HCL 0.1 MG PO TABS
0.1000 mg | ORAL_TABLET | Freq: Once | ORAL | Status: AC
  Administered 2014-08-27: 0.1 mg via ORAL
  Filled 2014-08-27: qty 1

## 2014-08-27 MED ORDER — SODIUM CHLORIDE 0.9 % IJ SOLN
3.0000 mL | Freq: Two times a day (BID) | INTRAMUSCULAR | Status: DC
  Administered 2014-08-28 – 2014-08-29 (×2): 3 mL via INTRAVENOUS

## 2014-08-27 MED ORDER — POLYETHYLENE GLYCOL 3350 17 G PO PACK
17.0000 g | PACK | Freq: Two times a day (BID) | ORAL | Status: DC
  Administered 2014-08-29: 17 g via ORAL
  Filled 2014-08-27 (×5): qty 1

## 2014-08-27 MED ORDER — ACETAMINOPHEN 650 MG RE SUPP: 650.0000 mg | Freq: Four times a day (QID) | RECTAL | Status: DC | PRN

## 2014-08-27 MED ORDER — HYDRALAZINE HCL 20 MG/ML IJ SOLN
5.0000 mg | Freq: Once | INTRAMUSCULAR | Status: AC
  Administered 2014-08-27: 5 mg via INTRAVENOUS
  Filled 2014-08-27: qty 1

## 2014-08-27 MED ORDER — PRAVASTATIN SODIUM 20 MG PO TABS
20.0000 mg | ORAL_TABLET | Freq: Every evening | ORAL | Status: DC
  Administered 2014-08-27 – 2014-08-28 (×2): 20 mg via ORAL
  Filled 2014-08-27 (×3): qty 1

## 2014-08-27 MED ORDER — LABETALOL HCL 5 MG/ML IV SOLN
10.0000 mg | INTRAVENOUS | Status: DC | PRN
  Administered 2014-08-28 (×2): 10 mg via INTRAVENOUS
  Filled 2014-08-27 (×4): qty 4

## 2014-08-27 MED ORDER — ACETAMINOPHEN 325 MG PO TABS
650.0000 mg | ORAL_TABLET | Freq: Four times a day (QID) | ORAL | Status: DC | PRN
  Administered 2014-08-29: 650 mg via ORAL
  Filled 2014-08-27: qty 2

## 2014-08-27 MED ORDER — ONDANSETRON HCL 4 MG/2ML IJ SOLN: 4.0000 mg | Freq: Four times a day (QID) | INTRAMUSCULAR | Status: DC | PRN

## 2014-08-27 MED ORDER — DONEPEZIL HCL 10 MG PO TABS
10.0000 mg | ORAL_TABLET | Freq: Every day | ORAL | Status: DC
  Administered 2014-08-27 – 2014-08-28 (×2): 10 mg via ORAL
  Filled 2014-08-27 (×3): qty 1

## 2014-08-27 MED ORDER — CYANOCOBALAMIN 1000 MCG/ML IJ SOLN: 1000.0000 ug | INTRAMUSCULAR | Status: DC

## 2014-08-27 MED ORDER — INSULIN ASPART 100 UNIT/ML ~~LOC~~ SOLN
0.0000 [IU] | Freq: Three times a day (TID) | SUBCUTANEOUS | Status: DC
Start: 2014-08-28 — End: 2014-08-29
  Administered 2014-08-28: 1 [IU] via SUBCUTANEOUS
  Administered 2014-08-28 – 2014-08-29 (×2): 2 [IU] via SUBCUTANEOUS

## 2014-08-27 MED ORDER — WARFARIN SODIUM 3 MG PO TABS
3.0000 mg | ORAL_TABLET | Freq: Once | ORAL | Status: AC
  Administered 2014-08-27: 3 mg via ORAL
  Filled 2014-08-27: qty 1

## 2014-08-27 MED ORDER — FERROUS SULFATE 325 (65 FE) MG PO TABS
325.0000 mg | ORAL_TABLET | Freq: Two times a day (BID) | ORAL | Status: DC
  Administered 2014-08-28 – 2014-08-29 (×3): 325 mg via ORAL
  Filled 2014-08-27 (×5): qty 1

## 2014-08-27 MED ORDER — DEXTROSE 5 % IV SOLN
1.0000 g | Freq: Once | INTRAVENOUS | Status: AC
  Administered 2014-08-27: 1 g via INTRAVENOUS
  Filled 2014-08-27: qty 10

## 2014-08-27 MED ORDER — ONDANSETRON HCL 4 MG PO TABS: 4.0000 mg | ORAL_TABLET | Freq: Four times a day (QID) | ORAL | Status: DC | PRN

## 2014-08-27 MED ORDER — CLONIDINE HCL 0.3 MG/24HR TD PTWK
0.3000 mg | MEDICATED_PATCH | TRANSDERMAL | Status: DC
  Administered 2014-08-28: 0.3 mg via TRANSDERMAL
  Filled 2014-08-27: qty 1

## 2014-08-27 MED ORDER — FLUTICASONE PROPIONATE 50 MCG/ACT NA SUSP
2.0000 | Freq: Every day | NASAL | Status: DC
  Administered 2014-08-28 – 2014-08-29 (×2): 2 via NASAL
  Filled 2014-08-27: qty 16

## 2014-08-27 MED ORDER — CEFTRIAXONE SODIUM 1 G IJ SOLR: 1.0000 g | Freq: Once | INTRAMUSCULAR | Status: DC

## 2014-08-27 MED ORDER — INSULIN GLARGINE 100 UNIT/ML ~~LOC~~ SOLN
23.0000 [IU] | Freq: Every day | SUBCUTANEOUS | Status: DC
  Administered 2014-08-28 – 2014-08-29 (×2): 23 [IU] via SUBCUTANEOUS
  Filled 2014-08-27 (×2): qty 0.23

## 2014-08-27 MED ORDER — WARFARIN - PHARMACIST DOSING INPATIENT
Freq: Every day | Status: DC
  Administered 2014-08-28: 17:00:00

## 2014-08-27 MED ORDER — AMOXICILLIN-POT CLAVULANATE 875-125 MG PO TABS: 1.0000 | ORAL_TABLET | Freq: Two times a day (BID) | ORAL | Status: DC

## 2014-08-27 MED ORDER — PANTOPRAZOLE SODIUM 40 MG PO TBEC
40.0000 mg | DELAYED_RELEASE_TABLET | Freq: Every day | ORAL | Status: DC
  Administered 2014-08-28 – 2014-08-29 (×2): 40 mg via ORAL
  Filled 2014-08-27 (×2): qty 1

## 2014-08-27 NOTE — ED Notes (Signed)
Heart healthy tray ordered for pt.  

## 2014-08-27 NOTE — ED Provider Notes (Signed)
CSN: [id number]     Arrival date & time 08/27/14  1236 History   First MD Initiated Contact with Patient 08/27/14 1241     Chief Complaint  Patient presents with  . Hypertension     (Consider location/radiation/quality/duration/timing/severity/associated sxs/prior Treatment) HPI   Lindsay Sheppard is a 80 y.o. female by in by EMS for evaluation of elevated blood pressure. History supplied by daughter who is her main caregiver. Patient states that her other family members were taking care of the patient this morning and became concerned when blood pressure remained around 200/110 despite taking blood pressure medications. Pt with no complaints, and normal state of health, mentating normally not complaining of any pain. She denies headache, chest pain, shortness of breath, cough, fever, chills. She recently completed a ten-day course of antibiotics approximately 5 days ago but daughter feels that she is urinating frequently and would like to have the urinalysis evaluated. Patient is urinating every 1-1/2 hours.  Past Medical History  Diagnosis Date  . POSTHERPETIC NEURALGIA 11/02/2009  . B12 DEFICIENCY 05/23/2007  . HYPERLIPIDEMIA 05/23/2007  . HYPERKALEMIA 05/30/2010  . ANEMIA-IRON DEFICIENCY 01/26/2007  . BLEPHARITIS, BILATERAL 02/23/2008  . Other specified forms of hearing loss 05/30/2010  . HYPERTENSION 01/26/2007  . Atrial fibrillation 05/23/2007  . DIASTOLIC HEART FAILURE, CHRONIC 12/14/2009  . POSTURAL HYPOTENSION 02/07/2008  . ALLERGIC RHINITIS 11/02/2009  . GERD 01/26/2007  . CONSTIPATION 01/25/2008  . SKIN LESION 05/30/2010  . BACK PAIN 08/02/2007  . OSTEOPOROSIS 05/23/2007  . SYNCOPE 02/07/2008  . FATIGUE 09/06/2007  . Dysuria 09/05/2008  . Abdominal pain, epigastric 09/06/2007  . EPIGASTRIC TENDERNESS 10/12/2007  . PULMONARY EMBOLISM, HX OF 05/23/2007  . SHINGLES, HX OF 05/23/2007  . 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 Left foot pain   . Diabetic neuropathy   . RENAL INSUFFICIENCY 07/02/2009  .  RENAL CYST, LEFT 09/06/2007  . Kidney stones     "never had OR"  . Family history of anesthesia complication     "daughter has PONV; another daughter a little anesthesia lasts way too long"  . CHF (congestive heart failure) 2011  . Myocardial infarction 2011    "mild"  . DVT (deep venous thrombosis) ~ 1964    "think it was in the left"  . Aspiration pneumonia ~ 2006    "due to aspiration"  . DIABETES MELLITUS, TYPE II 09/06/2007  . DEPRESSIVE DISORDER 01/25/2008    "@ times; never treated for it"  . Melanoma of nose   . Compression fracture of lumbar vertebra     hx  . Compression fracture of thoracic vertebra 02/07/2014    "fell this am" (02/07/2014  . Arthritis     "hands" (03/15/2014)   Past Surgical History  Procedure Laterality Date  . Appendectomy  1943  . Abdominal hysterectomy  1964  . Cardiac electrophysiology mapping and ablation  1999  . Cataract extraction w/ intraocular lens  implant, bilateral  2003-2005  . Carpal tunnel release Right 2004  . Mohs surgery  2011    "removed off her nose"  . Cardiac catheterization  10/2009  . Esophageal dilation  X 2   Family History  Problem Relation Age of Onset  . Breast cancer Mother   . Colon cancer Father   . Stroke Father     died with stroke postop  . Diabetes Sister     2 sisters  . Hypertension Other    History  Substance Use Topics  . Smoking status: Never Smoker   .  Smokeless tobacco: Never Used  . Alcohol Use: No   OB History    No data available     Review of Systems  10 systems reviewed and found to be negative, except as noted in the HPI.   Allergies  Amiodarone; Deltasone; Diltiazem hcl; Levofloxacin; Pregabalin; Spironolactone; Tequin; and Diazepam  Home Medications   Prior to Admission medications   Medication Sig Start Date End Date Taking? Authorizing Provider  acetaminophen (TYLENOL) 325 MG tablet Take 2 tablets (650 mg total) by mouth 3 (three) times daily. 02/09/14   Delfina Redwood, MD   B-D INS SYRINGE 0.5CC/31GX5/16 31G X 5/16" 0.5 ML MISC USE DAILY AS INSTRUCTED 07/13/14   Biagio Borg, MD  cephALEXin (KEFLEX) 500 MG capsule Take 1 capsule (500 mg total) by mouth 4 (four) times daily. 08/08/14   Biagio Borg, MD  cloNIDine (CATAPRES - DOSED IN MG/24 HR) 0.3 mg/24hr patch Place 0.3 mg onto the skin once a week.    Historical Provider, MD  CRANBERRY PO Take 500 mg by mouth daily.     Historical Provider, MD  cyanocobalamin (,VITAMIN B-12,) 1000 MCG/ML injection INJECT 1 ML (1000 MCG) INTO THE MUSCLE EVERY 30 DAYS. 04/25/14   Biagio Borg, MD  donepezil (ARICEPT) 10 MG tablet Take 1 tablet (10 mg total) by mouth at bedtime. Patient not taking: Reported on 08/21/2014 08/08/14   Biagio Borg, MD  donepezil (ARICEPT) 5 MG tablet Take 5 mg by mouth at bedtime.    Historical Provider, MD  ferrous sulfate 325 (65 FE) MG tablet Take 1 tablet (325 mg total) by mouth 2 (two) times daily with a meal. 02/15/14   Bary Leriche, PA-C  fluconazole (DIFLUCAN) 150 MG tablet 1 tab by mouth every 3 days as needed 08/23/14   Biagio Borg, MD  fluticasone [facility]) 50 MCG/ACT nasal spray Place 2 sprays into the nose daily. 03/22/13   Biagio Borg, MD  glucose blood (ACCU-CHEK COMPACT PLUS) test strip Use as directed twice daily to check blood sugar.  Diagnosis code 250.00 03/30/14   Biagio Borg, MD  Hypromellose (ARTIFICIAL TEARS OP) Place 1 drop into both eyes daily.    Historical Provider, MD  insulin glargine (LANTUS) 100 UNIT/ML injection Inject 21 Units into the skin daily.    Historical Provider, MD  labetalol (NORMODYNE) 200 MG tablet Take 400 mg by mouth 2 (two) times daily.    Historical Provider, MD  pantoprazole (PROTONIX) 40 MG tablet Take 1 tablet (40 mg total) by mouth daily. 07/20/13   Biagio Borg, MD  polyethylene glycol [facility] Floria Raveling) packet Take 17 g by mouth 2 (two) times daily. 02/15/14   Ivan Anchors Love, PA-C  pravastatin (PRAVACHOL) 40 MG tablet Take 20 mg by mouth daily. 1/2  daily 07/20/13   Biagio Borg, MD  warfarin (COUMADIN) 2 MG tablet Follow instructions per coumadin clinic 07/13/14   Biagio Borg, MD   BP 205/86 mmHg  Pulse 80  Temp(Src) 98.3 F (36.8 C) (Oral)  Resp 19  Ht 5\' 6"  (1.676 m)  Wt 145 lb (65.772 kg)  BMI 23.41 kg/m2  SpO2 98% Physical Exam  Constitutional: She is oriented to person, place, and time. She appears well-developed and well-nourished. No distress.  HENT:  Head: Normocephalic and atraumatic.  Mouth/Throat: Oropharynx is clear and moist.  Eyes: Conjunctivae and EOM are normal. Pupils are equal, round, and reactive to light.  Neck: Normal range of motion.  Cardiovascular: Normal rate, regular rhythm and intact distal pulses.   Pulmonary/Chest: Effort normal and breath sounds normal. No stridor. No respiratory distress. She has no wheezes. She has no rales. She exhibits no tenderness.  Abdominal: Soft. Bowel sounds are normal. She exhibits no distension and no mass. There is tenderness. There is no rebound and no guarding.  Mild suprapubic tenderness to palpation  Musculoskeletal: Normal range of motion. She exhibits no edema or tenderness.  Neurological: She is alert and oriented to person, place, and time.  Follows commands, Clear, goal oriented speech, Strength is 5 out of 5x4 extremities. Sensation is grossly intact.   Psychiatric: She has a normal mood and affect.  Nursing note and vitals reviewed.   ED Course  Procedures (including critical care time) Labs Review Labs Reviewed  URINE CULTURE  URINALYSIS, ROUTINE W REFLEX MICROSCOPIC    Imaging Review No results found.   EKG Interpretation None      MDM   Final diagnoses:  Weakness  Elevated blood pressure  UTI (lower urinary tract infection)    Filed Vitals:   08/27/14 1245 08/27/14 1315 08/27/14 1319 08/27/14 1330  BP: 205/86 219/94 219/94 218/92  Pulse: 80 79  81  Temp:      TempSrc:      Resp: 19 25  21   Height:      Weight:      SpO2:  98% 98%  98%    Medications  cloNIDine (CATAPRES) tablet 0.1 mg (0.1 mg Oral Given 08/27/14 1319)    Khaila A Hanback is a pleasant 80 y.o. female presenting with elevated blood pressure. Patient is asymptomatic, neuro exam is nonfocal, no chest pain or shortness of breath. No nausea or vomiting. Urinalysis ordered his daughter's request, she does have mild suprapubic tenderness to palpation.  0.1 mg of clonidine is given orally with little impact on her blood pressure. UA is consistent with urinary tract infection, review of prior culture and sensitivity shows it is sensitive to Rocephin. INR is therapeutic at 2.29. Creatinine is normal. Patient has just finished a ten-day course of Keflex with little relief. Patient is afebrile and well-appearing, no indication for lactic acid were blood cultures at this point.  This is a shared visit with the attending physician who personally evaluated the patient and agrees with the care plan.   Patient will be given IV hydralazine at 5 mg to try to bring the blood pressure down. If blood pressure does not reduce his systolic under 384 she will need admission. EKG and chest x-ray ordered for preparation for possible admission. Case signed out to Dr. Wyvonnia Dusky at shift change.  Monico Blitz, PA-C 08/27/14 1610

## 2014-08-27 NOTE — Progress Notes (Signed)
Report received from [employer]. Awaiting patients arrival.

## 2014-08-27 NOTE — Discharge Instructions (Signed)
Take your antibiotics as directed and to completion. You should never have any leftover antibiotics! Push fluids and stay well hydrated.   Any antibiotic use can reduce the efficacy of hormonal birth control. Please use back up method of contraception.   Please follow with your primary care doctor in  Hypertension Hypertension, commonly called high blood pressure, is when the force of blood pumping through your arteries is too strong. Your arteries are the blood vessels that carry blood from your heart throughout your body. A blood pressure reading consists of a higher number over a lower number, such as 110/72. The higher number (systolic) is the pressure inside your arteries when your heart pumps. The lower number (diastolic) is the pressure inside your arteries when your heart relaxes. Ideally you want your blood pressure below 120/80. Hypertension forces your heart to work harder to pump blood. Your arteries may become narrow or stiff. Having hypertension puts you at risk for heart disease, stroke, and other problems.  RISK FACTORS Some risk factors for high blood pressure are controllable. Others are not.  Risk factors you cannot control include:   Race. You may be at higher risk if you are African American.  Age. Risk increases with age.  Gender. Men are at higher risk than women before age 64 years. After age 51, women are at higher risk than men. Risk factors you can control include:  Not getting enough exercise or physical activity.  Being overweight.  Getting too much fat, sugar, calories, or salt in your diet.  Drinking too much alcohol. SIGNS AND SYMPTOMS Hypertension does not usually cause signs or symptoms. Extremely high blood pressure (hypertensive crisis) may cause headache, anxiety, shortness of breath, and nosebleed. DIAGNOSIS  To check if you have hypertension, your health care provider will measure your blood pressure while you are seated, with your arm held at the  level of your heart. It should be measured at least twice using the same arm. Certain conditions can cause a difference in blood pressure between your right and left arms. A blood pressure reading that is higher than normal on one occasion does not mean that you need treatment. If one blood pressure reading is high, ask your health care provider about having it checked again. TREATMENT  Treating high blood pressure includes making lifestyle changes and possibly taking medicine. Living a healthy lifestyle can help lower high blood pressure. You may need to change some of your habits. Lifestyle changes may include:  Following the DASH diet. This diet is high in fruits, vegetables, and whole grains. It is low in salt, red meat, and added sugars.  Getting at least 2 hours of brisk physical activity every week.  Losing weight if necessary.  Not smoking.  Limiting alcoholic beverages.  Learning ways to reduce stress. If lifestyle changes are not enough to get your blood pressure under control, your health care provider may prescribe medicine. You may need to take more than one. Work closely with your health care provider to understand the risks and benefits. HOME CARE INSTRUCTIONS  Have your blood pressure rechecked as directed by your health care provider.   Take medicines only as directed by your health care provider. Follow the directions carefully. Blood pressure medicines must be taken as prescribed. The medicine does not work as well when you skip doses. Skipping doses also puts you at risk for problems.   Do not smoke.   Monitor your blood pressure at home as directed by your health care  provider. SEEK MEDICAL CARE IF:   You think you are having a reaction to medicines taken.  You have recurrent headaches or feel dizzy.  You have swelling in your ankles.  You have trouble with your vision. SEEK IMMEDIATE MEDICAL CARE IF:  You develop a severe headache or confusion.  You  have unusual weakness, numbness, or feel faint.  You have severe chest or abdominal pain.  You vomit repeatedly.  You have trouble breathing. MAKE SURE YOU:   Understand these instructions.  Will watch your condition.  Will get help right away if you are not doing well or get worse. Document Released: 08/18/2005 Document Revised: 01/02/2014 Document Reviewed: 06/10/2013 [facility] Patient Information 2015 [place], [place]. This information is not intended to replace advice given to you by your health care provider. Make sure you discuss any questions you have with your health care provider. the next 2 days for a check-up. They must obtain records for further management.   Do not hesitate to return to the Emergency Department for any new, worsening or concerning symptoms.

## 2014-08-27 NOTE — H&P (Signed)
[facility] History and Physical  Lindsay Sheppard JSE:[id number] DOB: 1921-02-25 DOA: 08/27/2014  Referring physician: ER physician. PCP: [name], MD   Chief Complaint: Elevated blood pressure.  HPI: Lindsay Sheppard is a 80 y.o. female with history of hypertension, chronic atrial fibrillation, diabetes mellitus type 2, chronic kidney disease was brought to the ER after patient's daughter found that patient's blood pressure has been consistently remaining high since morning. Patient's daughter states that when they routinely checked patient's blood pressure in the morning it was more than 180 and and later on it became more than 200 and was brought to the ER. In the ER patient remained asymptomatic and did not have any chest pain headache visual symptoms abdominal pain nausea vomiting. CT head was negative for anything acute. But patient's blood pressure remained more than 073 systolic despite giving IV antihypertensives. Patient has been admitted for further management of accelerated hypertension. Patient states she has not missed her medications. In addition UA shows features concerning for UTI. Patient was recently treated with antibiotics for urinary tract infection.   Review of Systems: As presented in the history of presenting illness, rest negative.  Past Medical History  Diagnosis Date  . POSTHERPETIC NEURALGIA 11/02/2009  . B12 DEFICIENCY 05/23/2007  . HYPERLIPIDEMIA 05/23/2007  . HYPERKALEMIA 05/30/2010  . ANEMIA-IRON DEFICIENCY 01/26/2007  . BLEPHARITIS, BILATERAL 02/23/2008  . Other specified forms of hearing loss 05/30/2010  . HYPERTENSION 01/26/2007  . Atrial fibrillation 05/23/2007  . DIASTOLIC HEART FAILURE, CHRONIC 12/14/2009  . POSTURAL HYPOTENSION 02/07/2008  . ALLERGIC RHINITIS 11/02/2009  . GERD 01/26/2007  . CONSTIPATION 01/25/2008  . SKIN LESION 05/30/2010  . BACK PAIN 08/02/2007  . OSTEOPOROSIS 05/23/2007  . SYNCOPE 02/07/2008  . FATIGUE 09/06/2007  . Dysuria 09/05/2008  .  Abdominal pain, epigastric 09/06/2007  . EPIGASTRIC TENDERNESS 10/12/2007  . PULMONARY EMBOLISM, HX OF 05/23/2007  . SHINGLES, HX OF 05/23/2007  . 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 Left foot pain   . Diabetic neuropathy   . RENAL INSUFFICIENCY 07/02/2009  . RENAL CYST, LEFT 09/06/2007  . Kidney stones     "never had OR"  . Family history of anesthesia complication     "daughter has PONV; another daughter a little anesthesia lasts way too long"  . CHF (congestive heart failure) 2011  . Myocardial infarction 2011    "mild"  . DVT (deep venous thrombosis) ~ 1964    "think it was in the left"  . Aspiration pneumonia ~ 2006    "due to aspiration"  . DIABETES MELLITUS, TYPE II 09/06/2007  . DEPRESSIVE DISORDER 01/25/2008    "@ times; never treated for it"  . Melanoma of nose   . Compression fracture of lumbar vertebra     hx  . Compression fracture of thoracic vertebra 02/07/2014    "fell this am" (02/07/2014  . Arthritis     "hands" (03/15/2014)   Past Surgical History  Procedure Laterality Date  . Appendectomy  1943  . Abdominal hysterectomy  1964  . Cardiac electrophysiology mapping and ablation  1999  . Cataract extraction w/ intraocular lens  implant, bilateral  2003-2005  . Carpal tunnel release Right 2004  . Mohs surgery  2011    "removed off her nose"  . Cardiac catheterization  10/2009  . Esophageal dilation  X 2   Social History:  reports that she has never smoked. She has never used smokeless tobacco. She reports that she does not drink alcohol or use illicit  drugs. Where does patient live home. Can patient participate in ADLs? Yes.  Allergies  Allergen Reactions  . Amiodarone Other (See Comments)    REACTION: f? fluid retention  . Deltasone [Prednisone] Other (See Comments)    unknown  . Diltiazem Hcl Other (See Comments)    unknown  . Levofloxacin Nausea And Vomiting    REACTION: nausea  . Pregabalin Other (See Comments)    REACTION: hallucinations  . Spironolactone  Other (See Comments)    REACTION: elevated K at lowest dose  . Tequin [Gatifloxacin] Other (See Comments)    Hypoglycemia  . Diazepam Anxiety and Other (See Comments)    REACTION: agitation    Family History:  Family History  Problem Relation Age of Onset  . Breast cancer Mother   . Colon cancer Father   . Stroke Father     died with stroke postop  . Diabetes Sister     2 sisters  . Hypertension Other       Prior to Admission medications   Medication Sig Start Date End Date Taking? Authorizing Provider  acetaminophen (TYLENOL) 325 MG tablet Take 2 tablets (650 mg total) by mouth 3 (three) times daily. 02/09/14  Yes Delfina Redwood, MD  cloNIDine (CATAPRES - DOSED IN MG/24 HR) 0.3 mg/24hr patch Place 0.3 mg onto the skin once a week.   Yes Historical Provider, MD  CRANBERRY PO Take 500 mg by mouth daily.    Yes Historical Provider, MD  cyanocobalamin (,VITAMIN B-12,) 1000 MCG/ML injection INJECT 1 ML (1000 MCG) INTO THE MUSCLE EVERY 30 DAYS. 04/25/14  Yes Biagio Borg, MD  donepezil (ARICEPT) 10 MG tablet Take 1 tablet (10 mg total) by mouth at bedtime. 08/08/14  Yes Biagio Borg, MD  donepezil (ARICEPT) 5 MG tablet Take 5 mg by mouth at bedtime.   Yes Historical Provider, MD  ferrous sulfate 325 (65 FE) MG tablet Take 1 tablet (325 mg total) by mouth 2 (two) times daily with a meal. 02/15/14  Yes Ivan Anchors Love, PA-C  fluticasone (FLONASE) 50 MCG/ACT nasal spray Place 2 sprays into the nose daily. 03/22/13  Yes Biagio Borg, MD  Hypromellose (ARTIFICIAL TEARS OP) Place 1 drop into both eyes daily.   Yes Historical Provider, MD  insulin glargine (LANTUS) 100 UNIT/ML injection Inject 23 Units into the skin daily.    Yes Historical Provider, MD  labetalol (NORMODYNE) 200 MG tablet Take 400 mg by mouth 2 (two) times daily.   Yes Historical Provider, MD  pantoprazole (PROTONIX) 40 MG tablet Take 1 tablet (40 mg total) by mouth daily. 07/20/13  Yes Biagio Borg, MD  polyethylene glycol  [facility] / Floria Raveling) packet Take 17 g by mouth 2 (two) times daily. 02/15/14  Yes Ivan Anchors Love, PA-C  pravastatin (PRAVACHOL) 40 MG tablet Take 20 mg by mouth every evening.  07/20/13  Yes Biagio Borg, MD  warfarin (COUMADIN) 2 MG tablet Follow instructions per coumadin clinic Patient taking differently: Take 1-3 mg by mouth daily. Take 3 mg daily EXCEPT on Tuesday & Thursday take 1mg  07/13/14  Yes Biagio Borg, MD  amoxicillin-clavulanate (AUGMENTIN) 875-125 MG per tablet Take 1 tablet by mouth 2 (two) times daily. One tab po bid x 10 days 08/27/14   Monico Blitz, PA-C  fluconazole (DIFLUCAN) 150 MG tablet 1 tab by mouth every 3 days as needed 08/23/14   Biagio Borg, MD    Physical Exam: Filed Vitals:   08/27/14 1945 08/27/14  2000 08/27/14 2015 08/27/14 2030  BP: 186/77 183/81 192/95 196/81  Pulse: 96 100 107 102  Temp:      TempSrc:      Resp: 22 24 21 24   Height:      Weight:      SpO2: 97% 98% 97% 98%     General:  Well-developed and nourished.  Eyes: Anicteric no pallor.  ENT: No discharge from the ears eyes nose or mouth.  Neck: No mass felt. No JVD appreciated.  Cardiovascular: S1-S2 heard.  Respiratory: No rhonchi or crepitations.  Abdomen: Soft nontender bowel sounds present. No guarding or rigidity.  Skin: No rash.  Musculoskeletal: No edema.  Psychiatric: Appears normal.  Neurologic: Alert awake oriented to time place and person. Moves all extremities.  Labs on Admission:  Basic Metabolic Panel:  Recent Labs Lab 08/27/14 1417  NA 141  K 3.8  CL 103  GLUCOSE 91  BUN 26*  CREATININE 1.00   Liver Function Tests: No results for input(s): AST, ALT, ALKPHOS, BILITOT, PROT, ALBUMIN in the last 168 hours. No results for input(s): LIPASE, AMYLASE in the last 168 hours. No results for input(s): AMMONIA in the last 168 hours. CBC:  Recent Labs Lab 08/27/14 1417 08/27/14 1451  WBC  --  4.3  NEUTROABS  --  3.2  HGB 12.9 12.3  HCT 38.0 38.8   MCV  --  87.2  PLT  --  141*   Cardiac Enzymes: No results for input(s): CKTOTAL, CKMB, CKMBINDEX, TROPONINI in the last 168 hours.  BNP (last 3 results) No results for input(s): PROBNP in the last 8760 hours. CBG: No results for input(s): GLUCAP in the last 168 hours.  Radiological Exams on Admission: Ct Head Wo Contrast  08/27/2014   CLINICAL DATA:  Hypertension.  EXAM: CT HEAD WITHOUT CONTRAST  TECHNIQUE: Contiguous axial images were obtained from the base of the skull through the vertex without intravenous contrast.  COMPARISON:  Head CT scan 03/19/2014.  Brain MRI 03/21/2014.  FINDINGS: The brain is atrophic with mild appearing chronic microvascular ischemic change. No evidence of acute intracranial abnormality including infarct, hemorrhage, mass lesion, mass effect, midline shift or abnormal extra-axial fluid collection. No hydrocephalus or pneumocephalus. The calvarium is intact. Imaged paranasal sinuses and mastoid air cells are clear.  IMPRESSION: No acute abnormality.  Atrophy and mild chronic microvascular ischemic change.   Electronically Signed   By: Inge Rise M.D.   On: 08/27/2014 19:15   Dg Chest Port 1 View  08/27/2014   CLINICAL DATA:  Weakness, hypertension  EXAM: PORTABLE CHEST - 1 VIEW  COMPARISON:  03/19/2014  FINDINGS: Cardiomediastinal silhouette is stable. Hyperinflation again noted. Atherosclerotic calcifications of thoracic aorta. No acute infiltrate or pleural effusion. No pulmonary edema.  IMPRESSION: No active disease.  Hyperinflation again noted.   Electronically Signed   By: Lahoma Crocker M.D.   On: 08/27/2014 16:34     Assessment/Plan Principal Problem:   Accelerated hypertension Active Problems:   CKD (chronic kidney disease) 3-4   Chronic atrial fibrillation   Diabetes mellitus type 2, controlled   1. Accelerated hypertension - not sure why patient's blood pressure has become uncontrolled. Patient is asymptomatic otherwise. At this time I have  placed patient on her home medication and ordered 1 dose of her home dose of labetalol by mouth and placed patient on when necessary IV labetalol and closely observe overnight for blood pressure trends. 2. Diabetes mellitus type 2 controlled - continue home medications. 3. Chronic  atrial fibrillation - monitor shows sinus rhythm at this time. Coumadin per pharmacy. 4. Chronic kidney disease stage III - creatinine appears to be at baseline. 5. UTI - patient has been placed on ceftriaxone. Follow urine cultures.   DVT Prophylaxis patient is on Coumadin for atrial fibrillation.  Code Status: Full code.  Family Communication: Patient's daughter at the bedside.  Disposition Plan: Admit for observation.    Lorrie Gargan N. [facility] Pager [phone].  If 7PM-7AM, please contact night-coverage [email or web address] Password [facility] 08/27/2014, 9:27 PM

## 2014-08-27 NOTE — ED Notes (Signed)
Pt daughter at bedside

## 2014-08-27 NOTE — ED Provider Notes (Signed)
Medical screening examination/treatment/procedure(s) were conducted as a shared visit with non-physician practitioner(s) and myself.  I personally evaluated the patient during the encounter.   EKG Interpretation None     Patient here with increased blood pressure at home without symptoms. Recently treated for UTI with 10 day course of Keflex. Urinalysis shows mild infection at this time possibly chronic. No signs of end organ damage on patients be met. Patient given clonidine without response to her blood pressure. Will give dose of IV hydralazine and is blood pressure improves will have patient follow-up tomorrow with her doctor. We'll give patient dose of IV Rocephin here and plan discussed at length with family  Leota Jacobsen, MD 08/27/14 1539

## 2014-08-27 NOTE — ED Provider Notes (Signed)
  Physical Exam  BP 156/67 mmHg  Pulse 81  Temp(Src) 98.3 F (36.8 C) (Oral)  Resp 20  Ht 5\' 6"  (1.676 m)  Wt 145 lb (65.772 kg)  BMI 23.41 kg/m2  SpO2 97%  Physical Exam  ED Course  Procedures  MDM Received care of patient at 4 PM from [facility]. Please see her note for prior history, physical and care. Briefly this is a 80 year old female with a history of hypertension, CK D, chronic atrial fibrillation, diabetes, atrial fibrillation on Coumadin who presents with concern of elevated blood pressure and urinary frequency. Patient was found to have a urinary tract infection. She recently been treated for the same. A CT head was done which showed no acute intracranial bleed.  INR is therapeutic.  Blood pressures were initially 681 systolic and after receiving hydralazine initially decreased, however after time increased again.  Patient was admitted to hospitalists for concern of hypertensive urgency as well as recurrent urinary tract infection.      Alvino Chapel, MD 08/28/14 2751  Ezequiel Essex, MD 08/28/14 [phone]

## 2014-08-27 NOTE — Progress Notes (Signed)
ANTICOAGULATION CONSULT NOTE - Initial Consult  Pharmacy Consult for Warfarin Indication: atrial fibrillation  Allergies  Allergen Reactions  . Amiodarone Other (See Comments)    REACTION: f? fluid retention  . Deltasone [Prednisone] Other (See Comments)    unknown  . Diltiazem Hcl Other (See Comments)    unknown  . Levofloxacin Nausea And Vomiting    REACTION: nausea  . Pregabalin Other (See Comments)    REACTION: hallucinations  . Spironolactone Other (See Comments)    REACTION: elevated K at lowest dose  . Tequin [Gatifloxacin] Other (See Comments)    Hypoglycemia  . Diazepam Anxiety and Other (See Comments)    REACTION: agitation    Patient Measurements: Height: 5\' 6"  (167.6 cm) Weight: 145 lb (65.772 kg) IBW/kg (Calculated) : 59.3  Vital Signs: Temp: 98.3 F (36.8 C) (12/27 1242) Temp Source: Oral (12/27 1242) BP: 196/81 mmHg (12/27 2030) Pulse Rate: 102 (12/27 2030)  Labs:  Recent Labs  08/27/14 1417 08/27/14 1451  HGB 12.9 12.3  HCT 38.0 38.8  PLT  --  141*  LABPROT  --  25.4*  INR  --  2.29*  CREATININE 1.00  --     Estimated Creatinine Clearance: 34.3 mL/min (by C-G formula based on Cr of 1).   Medical History: Past Medical History  Diagnosis Date  . POSTHERPETIC NEURALGIA 11/02/2009  . B12 DEFICIENCY 05/23/2007  . HYPERLIPIDEMIA 05/23/2007  . HYPERKALEMIA 05/30/2010  . ANEMIA-IRON DEFICIENCY 01/26/2007  . BLEPHARITIS, BILATERAL 02/23/2008  . Other specified forms of hearing loss 05/30/2010  . HYPERTENSION 01/26/2007  . Atrial fibrillation 05/23/2007  . DIASTOLIC HEART FAILURE, CHRONIC 12/14/2009  . POSTURAL HYPOTENSION 02/07/2008  . ALLERGIC RHINITIS 11/02/2009  . GERD 01/26/2007  . CONSTIPATION 01/25/2008  . SKIN LESION 05/30/2010  . BACK PAIN 08/02/2007  . OSTEOPOROSIS 05/23/2007  . SYNCOPE 02/07/2008  . FATIGUE 09/06/2007  . Dysuria 09/05/2008  . Abdominal pain, epigastric 09/06/2007  . EPIGASTRIC TENDERNESS 10/12/2007  . PULMONARY EMBOLISM, HX OF  05/23/2007  . SHINGLES, HX OF 05/23/2007  . 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 Left foot pain   . Diabetic neuropathy   . RENAL INSUFFICIENCY 07/02/2009  . RENAL CYST, LEFT 09/06/2007  . Kidney stones     "never had OR"  . Family history of anesthesia complication     "daughter has PONV; another daughter a little anesthesia lasts way too long"  . CHF (congestive heart failure) 2011  . Myocardial infarction 2011    "mild"  . DVT (deep venous thrombosis) ~ 1964    "think it was in the left"  . Aspiration pneumonia ~ 2006    "due to aspiration"  . DIABETES MELLITUS, TYPE II 09/06/2007  . DEPRESSIVE DISORDER 01/25/2008    "@ times; never treated for it"  . Melanoma of nose   . Compression fracture of lumbar vertebra     hx  . Compression fracture of thoracic vertebra 02/07/2014    "fell this am" (02/07/2014  . Arthritis     "hands" (03/15/2014)    Medications:  Prescriptions prior to admission  Medication Sig Dispense Refill Last Dose  . acetaminophen (TYLENOL) 325 MG tablet Take 2 tablets (650 mg total) by mouth 3 (three) times daily.   08/26/2014 at Unknown time  . cloNIDine (CATAPRES - DOSED IN MG/24 HR) 0.3 mg/24hr patch Place 0.3 mg onto the skin once a week.   08/21/2014 at Unknown time  . CRANBERRY PO Take 500 mg by mouth daily.  08/26/2014 at Unknown time  . cyanocobalamin (,VITAMIN B-12,) 1000 MCG/ML injection INJECT 1 ML (1000 MCG) INTO THE MUSCLE EVERY 30 DAYS. 1 mL 11 08/02/2014  . donepezil (ARICEPT) 10 MG tablet Take 1 tablet (10 mg total) by mouth at bedtime. 90 tablet 3 08/26/2014 at Unknown time  . donepezil (ARICEPT) 5 MG tablet Take 5 mg by mouth at bedtime.   08/26/2014 at Unknown time  . ferrous sulfate 325 (65 FE) MG tablet Take 1 tablet (325 mg total) by mouth 2 (two) times daily with a meal. 60 tablet 1 08/27/2014 at Unknown time  . fluticasone (FLONASE) 50 MCG/ACT nasal spray Place 2 sprays into the nose daily. 16 g 2 08/27/2014 at Unknown time  . Hypromellose  (ARTIFICIAL TEARS OP) Place 1 drop into both eyes daily.   08/26/2014 at Unknown time  . insulin glargine (LANTUS) 100 UNIT/ML injection Inject 23 Units into the skin daily.    08/27/2014 at Unknown time  . labetalol (NORMODYNE) 200 MG tablet Take 400 mg by mouth 2 (two) times daily.   08/27/2014 at 0830  . pantoprazole (PROTONIX) 40 MG tablet Take 1 tablet (40 mg total) by mouth daily. 90 tablet 3 08/27/2014 at Unknown time  . polyethylene glycol (MIRALAX / GLYCOLAX) packet Take 17 g by mouth 2 (two) times daily. 14 each 0 Past Week at Unknown time  . pravastatin (PRAVACHOL) 40 MG tablet Take 20 mg by mouth every evening.    08/26/2014 at Unknown time  . warfarin (COUMADIN) 2 MG tablet Follow instructions per coumadin clinic (Patient taking differently: Take 1-3 mg by mouth daily. Take 3 mg daily EXCEPT on Tuesday & Thursday take 1mg ) 90 tablet 0 08/26/2014 at Unknown time  . fluconazole (DIFLUCAN) 150 MG tablet 1 tab by mouth every 3 days as needed 2 tablet 1    Scheduled:  . [START ON 08/28/2014] cloNIDine  0.3 mg Transdermal Weekly  . [START ON 09/01/2014] cyanocobalamin  1,000 mcg Subcutaneous Q30 days  . donepezil  10 mg Oral QHS  . [START ON 08/28/2014] ferrous sulfate  325 mg Oral BID WC  . [START ON 08/28/2014] fluticasone  2 spray Each Nare Daily  . [START ON 08/28/2014] insulin aspart  0-9 Units Subcutaneous TID WC  . [START ON 08/28/2014] insulin glargine  23 Units Subcutaneous Daily  . labetalol  400 mg Oral BID  . [START ON 08/28/2014] pantoprazole  40 mg Oral Daily  . polyethylene glycol  17 g Oral BID  . pravastatin  20 mg Oral QPM  . sodium chloride  3 mL Intravenous Q12H   Infusions:    Assessment: 80yo female brought in by ambulance for elevated BP. Pharmacy is consulted to dose warfarin for atrial fibrillation. INR on presentation is 2.29, Hgb 12.3, Plt 141  Pt takes warfarin 1mg  PO Tues and Thurs and 3mg  AODs.  Goal of Therapy:  INR 2-3 Monitor platelets by  anticoagulation protocol: Yes   Plan:  Warfarin 3mg  PO tonight x 1 Daily INR/CBC Monitor s/sx of bleeding  Andrey Cota. Diona Foley, PharmD Clinical Pharmacist Pager [phone] 08/27/2014,9:34 PM

## 2014-08-27 NOTE — ED Notes (Signed)
Per PTAR: Family states that pt has been hypertensive since this a.m, pt has had morning medicines but family reports pt remains hypertensive. Denies any ALOC, neuro deficits. PTAR reports initial BP of 208/104. Pt denies any pain, calm and cooperative. Pt is deaf in left ear.

## 2014-08-27 NOTE — ED Notes (Signed)
Attempted report 

## 2014-08-28 DIAGNOSIS — Z86718 Personal history of other venous thrombosis and embolism: Secondary | ICD-10-CM | POA: Diagnosis not present

## 2014-08-28 DIAGNOSIS — I482 Chronic atrial fibrillation: Secondary | ICD-10-CM | POA: Diagnosis present

## 2014-08-28 DIAGNOSIS — M1990 Unspecified osteoarthritis, unspecified site: Secondary | ICD-10-CM | POA: Diagnosis present

## 2014-08-28 DIAGNOSIS — K5900 Constipation, unspecified: Secondary | ICD-10-CM | POA: Diagnosis present

## 2014-08-28 DIAGNOSIS — E1140 Type 2 diabetes mellitus with diabetic neuropathy, unspecified: Secondary | ICD-10-CM | POA: Diagnosis present

## 2014-08-28 DIAGNOSIS — I129 Hypertensive chronic kidney disease with stage 1 through stage 4 chronic kidney disease, or unspecified chronic kidney disease: Secondary | ICD-10-CM | POA: Diagnosis present

## 2014-08-28 DIAGNOSIS — E875 Hyperkalemia: Secondary | ICD-10-CM | POA: Diagnosis present

## 2014-08-28 DIAGNOSIS — Z7901 Long term (current) use of anticoagulants: Secondary | ICD-10-CM | POA: Diagnosis not present

## 2014-08-28 DIAGNOSIS — I10 Essential (primary) hypertension: Secondary | ICD-10-CM | POA: Diagnosis present

## 2014-08-28 DIAGNOSIS — N183 Chronic kidney disease, stage 3 (moderate): Secondary | ICD-10-CM | POA: Diagnosis present

## 2014-08-28 DIAGNOSIS — K219 Gastro-esophageal reflux disease without esophagitis: Secondary | ICD-10-CM | POA: Diagnosis present

## 2014-08-28 DIAGNOSIS — Z86711 Personal history of pulmonary embolism: Secondary | ICD-10-CM | POA: Diagnosis not present

## 2014-08-28 DIAGNOSIS — F329 Major depressive disorder, single episode, unspecified: Secondary | ICD-10-CM | POA: Diagnosis present

## 2014-08-28 DIAGNOSIS — N390 Urinary tract infection, site not specified: Secondary | ICD-10-CM | POA: Diagnosis present

## 2014-08-28 DIAGNOSIS — I951 Orthostatic hypotension: Secondary | ICD-10-CM | POA: Diagnosis present

## 2014-08-28 DIAGNOSIS — J309 Allergic rhinitis, unspecified: Secondary | ICD-10-CM | POA: Diagnosis present

## 2014-08-28 DIAGNOSIS — E119 Type 2 diabetes mellitus without complications: Secondary | ICD-10-CM | POA: Diagnosis present

## 2014-08-28 DIAGNOSIS — I5032 Chronic diastolic (congestive) heart failure: Secondary | ICD-10-CM | POA: Diagnosis present

## 2014-08-28 DIAGNOSIS — E785 Hyperlipidemia, unspecified: Secondary | ICD-10-CM | POA: Diagnosis present

## 2014-08-28 DIAGNOSIS — D509 Iron deficiency anemia, unspecified: Secondary | ICD-10-CM | POA: Diagnosis present

## 2014-08-28 DIAGNOSIS — E538 Deficiency of other specified B group vitamins: Secondary | ICD-10-CM | POA: Diagnosis present

## 2014-08-28 LAB — CBC WITH DIFFERENTIAL/PLATELET
Basophils Absolute: 0 10*3/uL (ref 0.0–0.1)
Basophils Relative: 0 % (ref 0–1)
EOS ABS: 0.1 10*3/uL (ref 0.0–0.7)
EOS PCT: 1 % (ref 0–5)
HEMATOCRIT: 42.3 % (ref 36.0–46.0)
HEMOGLOBIN: 13.4 g/dL (ref 12.0–15.0)
Lymphocytes Relative: 12 % (ref 12–46)
Lymphs Abs: 0.6 10*3/uL — ABNORMAL LOW (ref 0.7–4.0)
MCH: 27.9 pg (ref 26.0–34.0)
MCHC: 31.7 g/dL (ref 30.0–36.0)
MCV: 87.9 fL (ref 78.0–100.0)
MONOS PCT: 8 % (ref 3–12)
Monocytes Absolute: 0.4 10*3/uL (ref 0.1–1.0)
Neutro Abs: 4.3 10*3/uL (ref 1.7–7.7)
Neutrophils Relative %: 79 % — ABNORMAL HIGH (ref 43–77)
Platelets: 152 10*3/uL (ref 150–400)
RBC: 4.81 MIL/uL (ref 3.87–5.11)
RDW: 16 % — ABNORMAL HIGH (ref 11.5–15.5)
WBC: 5.4 10*3/uL (ref 4.0–10.5)

## 2014-08-28 LAB — COMPREHENSIVE METABOLIC PANEL
ALBUMIN: 3.5 g/dL (ref 3.5–5.2)
ALK PHOS: 62 U/L (ref 39–117)
ALT: 19 U/L (ref 0–35)
AST: 19 U/L (ref 0–37)
Anion gap: 8 (ref 5–15)
BILIRUBIN TOTAL: 0.6 mg/dL (ref 0.3–1.2)
BUN: 16 mg/dL (ref 6–23)
CHLORIDE: 103 meq/L (ref 96–112)
CO2: 27 mmol/L (ref 19–32)
Calcium: 9.5 mg/dL (ref 8.4–10.5)
Creatinine, Ser: 0.9 mg/dL (ref 0.50–1.10)
GFR calc Af Amer: 63 mL/min — ABNORMAL LOW (ref >90)
GFR calc non Af Amer: 54 mL/min — ABNORMAL LOW (ref >90)
Glucose, Bld: 91 mg/dL (ref 70–99)
POTASSIUM: 3.3 mmol/L — AB (ref 3.5–5.1)
SODIUM: 138 mmol/L (ref 135–145)
TOTAL PROTEIN: 5.8 g/dL — AB (ref 6.0–8.3)

## 2014-08-28 LAB — PROTIME-INR
INR: 1.85 — ABNORMAL HIGH (ref 0.00–1.49)
Prothrombin Time: 21.5 seconds — ABNORMAL HIGH (ref 11.6–15.2)

## 2014-08-28 LAB — GLUCOSE, CAPILLARY
GLUCOSE-CAPILLARY: 85 mg/dL (ref 70–99)
Glucose-Capillary: 159 mg/dL — ABNORMAL HIGH (ref 70–99)
Glucose-Capillary: 80 mg/dL (ref 70–99)
Glucose-Capillary: 157 mg/dL — ABNORMAL HIGH (ref 70–99)
Glucose-Capillary: 136 mg/dL — ABNORMAL HIGH (ref 70–99)

## 2014-08-28 MED ORDER — HYDRALAZINE HCL 20 MG/ML IJ SOLN
10.0000 mg | Freq: Four times a day (QID) | INTRAMUSCULAR | Status: DC | PRN
  Administered 2014-08-29: 10 mg via INTRAVENOUS
  Filled 2014-08-28: qty 1

## 2014-08-28 MED ORDER — WARFARIN SODIUM 3 MG PO TABS
3.0000 mg | ORAL_TABLET | Freq: Once | ORAL | Status: AC
Start: 2014-08-28 — End: 2014-08-28
  Administered 2014-08-28: 3 mg via ORAL
  Filled 2014-08-28: qty 1

## 2014-08-28 MED ORDER — CEFTRIAXONE SODIUM IN DEXTROSE 20 MG/ML IV SOLN
1.0000 g | INTRAVENOUS | Status: DC
  Administered 2014-08-28 – 2014-08-29 (×2): 1 g via INTRAVENOUS
  Filled 2014-08-28 (×2): qty 50

## 2014-08-28 NOTE — Progress Notes (Addendum)
ANTICOAGULATION CONSULT NOTE - Initial Consult  Pharmacy Consult for Warfarin Indication: atrial fibrillation  Allergies  Allergen Reactions  . Amiodarone Other (See Comments)    REACTION: f? fluid retention  . Deltasone [Prednisone] Other (See Comments)    unknown  . Diltiazem Hcl Other (See Comments)    unknown  . Levofloxacin Nausea And Vomiting    REACTION: nausea  . Pregabalin Other (See Comments)    REACTION: hallucinations  . Spironolactone Other (See Comments)    REACTION: elevated K at lowest dose  . Tequin [Gatifloxacin] Other (See Comments)    Hypoglycemia  . Diazepam Anxiety and Other (See Comments)    REACTION: agitation    Patient Measurements: Height: 5\' 6"  (167.6 cm) Weight: 117 lb 3.2 oz (53.162 kg) IBW/kg (Calculated) : 59.3  Vital Signs: Temp: 97.8 F (36.6 C) (12/28 0805) Temp Source: Oral (12/28 0805) BP: 203/90 mmHg (12/28 0805) Pulse Rate: 81 (12/28 0805)  Labs:  Recent Labs  08/27/14 1417 08/27/14 1451 08/28/14 0758  HGB 12.9 12.3 13.4  HCT 38.0 38.8 42.3  PLT  --  141* 152  LABPROT  --  25.4* 21.5*  INR  --  2.29* 1.85*  CREATININE 1.00  --  0.90    Estimated Creatinine Clearance: 34.2 mL/min (by C-G formula based on Cr of 0.9).   Assessment: 80yo female on chronic warfarin for atrial fibrillation. INR 1.85 slightly subtherapeutic. Cbc staqble, No bleeding noted per chart.  PTA dose: 2mg  on MWF, 3 mg on TTSS per coumadin clinic note on 12/21  Goal of Therapy:  INR 2-3 Monitor platelets by anticoagulation protocol: Yes   Plan:  Warfarin 3mg  PO tonight x 1 Daily INR/CBC Monitor s/sx of bleeding  Maryanna Shape, PharmD, BCPS  Clinical Pharmacist  Pager: [phone]  08/28/2014,10:27 AM   Addendum: Pharmacy is also consulted to dose rocephin for possible UTI. Pt. Received rocephin 1g x 1 in the ED on 12/27. Urine culture is pending.  Plan: Rocephin 1g IV Q 24 hrs F/u urine culture.  Maryanna Shape, PharmD, BCPS  Clinical  Pharmacist  Pager: [phone]

## 2014-08-28 NOTE — Progress Notes (Signed)
Pt bp 208/98 manually advised Dr. Wendee Beavers on floor, V/O recheck in 1 hour, pt has prn labetalol for systollic > 096

## 2014-08-28 NOTE — Progress Notes (Signed)
[facility] PROGRESS NOTE  Lindsay Sheppard IOX:[id number] DOB: 02-07-1921 DOA: 08/27/2014 PCP: [name], MD  Assessment/Plan: Principal Problem:   Accelerated hypertension - Hydralazine added prn - Labetalol on board prn - continue oral b blocker regimen  Active Problems:   CKD (chronic kidney disease) 3-4 -stable    Chronic atrial fibrillation - B blocker on board - Warfarin    Diabetes mellitus type 2, controlled -carb modified diet - on lantus and ssi  UTI - on Rocephin - await urine culture  Code Status: full Family Communication: No family at bedside Disposition Plan: Pending improvement in HTN   Consultants:  none  Procedures:  None  Antibiotics:  On rocephin  HPI/Subjective: Pt has no new complaints. Denies any HA's, blurred vision, or abdominal discomfort  Objective: Filed Vitals:   08/28/14 1228  BP: 170/92  Pulse:   Temp:   Resp:     Intake/Output Summary (Last 24 hours) at 08/28/14 1542 Last data filed at 08/28/14 1439  Gross per 24 hour  Intake    120 ml  Output   1675 ml  Net  -1555 ml   Filed Weights   08/27/14 1242 08/27/14 2127  Weight: 65.772 kg (145 lb) 53.162 kg (117 lb 3.2 oz)    Exam:   General:  Pt in nad, alert and awake  Cardiovascular: s1 and s2, no rubs  Respiratory: cta bl, no wheezes  Abdomen: soft, NT, Nd  Musculoskeletal: no cyanosis or clubbing   Data Reviewed: Basic Metabolic Panel:  Recent Labs Lab 08/27/14 1417 08/28/14 0758  NA 141 138  K 3.8 3.3*  CL 103 103  CO2  --  27  GLUCOSE 91 91  BUN 26* 16  CREATININE 1.00 0.90  CALCIUM  --  9.5   Liver Function Tests:  Recent Labs Lab 08/28/14 0758  AST 19  ALT 19  ALKPHOS 62  BILITOT 0.6  PROT 5.8*  ALBUMIN 3.5   No results for input(s): LIPASE, AMYLASE in the last 168 hours. No results for input(s): AMMONIA in the last 168 hours. CBC:  Recent Labs Lab 08/27/14 1417 08/27/14 1451 08/28/14 0758  WBC  --  4.3 5.4   NEUTROABS  --  3.2 4.3  HGB 12.9 12.3 13.4  HCT 38.0 38.8 42.3  MCV  --  87.2 87.9  PLT  --  141* 152   Cardiac Enzymes: No results for input(s): CKTOTAL, CKMB, CKMBINDEX, TROPONINI in the last 168 hours. BNP (last 3 results) No results for input(s): PROBNP in the last 8760 hours. CBG: No results for input(s): GLUCAP in the last 168 hours.  No results found for this or any previous visit (from the past 240 hour(s)).   Studies: Ct Head Wo Contrast  08/27/2014   CLINICAL DATA:  Hypertension.  EXAM: CT HEAD WITHOUT CONTRAST  TECHNIQUE: Contiguous axial images were obtained from the base of the skull through the vertex without intravenous contrast.  COMPARISON:  Head CT scan 03/19/2014.  Brain MRI 03/21/2014.  FINDINGS: The brain is atrophic with mild appearing chronic microvascular ischemic change. No evidence of acute intracranial abnormality including infarct, hemorrhage, mass lesion, mass effect, midline shift or abnormal extra-axial fluid collection. No hydrocephalus or pneumocephalus. The calvarium is intact. Imaged paranasal sinuses and mastoid air cells are clear.  IMPRESSION: No acute abnormality.  Atrophy and mild chronic microvascular ischemic change.   Electronically Signed   By: Inge Rise M.D.   On: 08/27/2014 19:15   Dg Chest Port 1  View  08/27/2014   CLINICAL DATA:  Weakness, hypertension  EXAM: PORTABLE CHEST - 1 VIEW  COMPARISON:  03/19/2014  FINDINGS: Cardiomediastinal silhouette is stable. Hyperinflation again noted. Atherosclerotic calcifications of thoracic aorta. No acute infiltrate or pleural effusion. No pulmonary edema.  IMPRESSION: No active disease.  Hyperinflation again noted.   Electronically Signed   By: Lahoma Crocker M.D.   On: 08/27/2014 16:34    Scheduled Meds: . cefTRIAXone (ROCEPHIN)  IV  1 g Intravenous Q24H  . cloNIDine  0.3 mg Transdermal Weekly  . [START ON 09/01/2014] cyanocobalamin  1,000 mcg Subcutaneous Q30 days  . donepezil  10 mg Oral QHS  .  ferrous sulfate  325 mg Oral BID WC  . fluticasone  2 spray Each Nare Daily  . insulin aspart  0-9 Units Subcutaneous TID WC  . insulin glargine  23 Units Subcutaneous Daily  . labetalol  400 mg Oral BID  . pantoprazole  40 mg Oral Daily  . polyethylene glycol  17 g Oral BID  . pravastatin  20 mg Oral QPM  . sodium chloride  3 mL Intravenous Q12H  . warfarin  3 mg Oral ONCE-1800  . Warfarin - Pharmacist Dosing Inpatient   Does not apply q1800   Continuous Infusions:    Time spent: > 35 minutes    Velvet Bathe  [facility] Pager [phone] If 7PM-7AM, please contact night-coverage at [email or web address], password [facility] 08/28/2014, 3:42 PM  LOS: 1 day

## 2014-08-28 NOTE — Progress Notes (Signed)
Pt bp 208/98 manually, gave hydralazine 10 mg IV for systollic greater than 888

## 2014-08-28 NOTE — Progress Notes (Signed)
Patient noted to have a systolic BP of 786 on admission to the floor. Scheduled meds given. recheck showed a systolic pressure of 767. PRN given. Report given to oncoming RN Otila Kluver who will follow-up on pts BP.

## 2014-08-28 NOTE — Progress Notes (Signed)
BP re-check 170/92, manually, labetalol effective.

## 2014-08-29 LAB — URINE CULTURE

## 2014-08-29 LAB — CBC
HEMATOCRIT: 42.6 % (ref 36.0–46.0)
Hemoglobin: 13.7 g/dL (ref 12.0–15.0)
MCH: 27.7 pg (ref 26.0–34.0)
MCHC: 32.2 g/dL (ref 30.0–36.0)
MCV: 86.1 fL (ref 78.0–100.0)
Platelets: 148 10*3/uL — ABNORMAL LOW (ref 150–400)
RBC: 4.95 MIL/uL (ref 3.87–5.11)
RDW: 16 % — ABNORMAL HIGH (ref 11.5–15.5)
WBC: 9.3 10*3/uL (ref 4.0–10.5)

## 2014-08-29 LAB — PROTIME-INR
INR: 2.3 — ABNORMAL HIGH (ref 0.00–1.49)
Prothrombin Time: 25.5 seconds — ABNORMAL HIGH (ref 11.6–15.2)

## 2014-08-29 LAB — GLUCOSE, CAPILLARY
GLUCOSE-CAPILLARY: 79 mg/dL (ref 70–99)
Glucose-Capillary: 175 mg/dL — ABNORMAL HIGH (ref 70–99)

## 2014-08-29 MED ORDER — GLUCERNA SHAKE PO LIQD: 237.0000 mL | Freq: Two times a day (BID) | ORAL | Status: DC

## 2014-08-29 MED ORDER — WARFARIN SODIUM 3 MG PO TABS
3.0000 mg | ORAL_TABLET | Freq: Once | ORAL | Status: DC
  Filled 2014-08-29: qty 1

## 2014-08-29 MED ORDER — CEFDINIR 300 MG PO CAPS: 300.0000 mg | ORAL_CAPSULE | Freq: Two times a day (BID) | ORAL | Status: DC

## 2014-08-29 MED ORDER — WARFARIN SODIUM 2 MG PO TABS
2.0000 mg | ORAL_TABLET | Freq: Once | ORAL | Status: DC
  Filled 2014-08-29: qty 1

## 2014-08-29 NOTE — Progress Notes (Addendum)
INITIAL NUTRITION ASSESSMENT  Pt meets criteria for SEVERE MALNUTRITION in the context of chronic illness as evidenced by severe fat and muscle mass loss.  DOCUMENTATION CODES Per approved criteria  -Severe malnutrition in the context of chronic illness   INTERVENTION: Provide Glucerna Shake po BID, each supplement provides 220 kcal and 10 grams of protein.  Encourage adequate PO intake.  NUTRITION DIAGNOSIS: Malnutrition related to chronic illness as evidenced by severe fat and muscle mass loss.   Goal: Pt to meet >/= 90% of their estimated nutrition needs   Monitor:  PO intake, weight trends, labs, I/O's  Reason for Assessment: MST  80 y.o. female  Admitting Dx: Accelerated hypertension  ASSESSMENT: Pt with history of hypertension, chronic atrial fibrillation, diabetes mellitus type 2, chronic kidney disease was brought to the ER after patient's daughter found that patient's blood pressure has been consistently remaining high.  Pt reports she has a good appetite currently and PTA at home eating 3 meals a day. Family at bedside, they report a couple of months ago, she has not been eating well however now her appetite has improved and she is eating fine. Pt with weight loss, however is not found significant. Pt is agreeable on Glucerna Shake. RD to order. Pt was encouraged to eat her food at meals and to drink her supplements.   Nutrition Focused Physical Exam:  Subcutaneous Fat:  Orbital Region: N/A Upper Arm Region: Severe depletion Thoracic and Lumbar Region: WNL  Muscle:  Temple Region: Severe depletion Clavicle Bone Region: Severe depletion Clavicle and Acromion Bone Region: Severe depletion Scapular Bone Region: N/A Dorsal Hand: N/A Patellar Region: Moderate depletion Anterior Thigh Region: Severe depletion Posterior Calf Region: Severe depletion  Edema: none    Height: Ht Readings from Last 1 Encounters:  08/27/14 5\' 6"  (1.676 m)    Weight: Wt  Readings from Last 1 Encounters:  08/28/14 116 lb (52.617 kg)    Ideal Body Weight: 130 lbs  % Ideal Body Weight: 89%  Wt Readings from Last 10 Encounters:  08/28/14 116 lb (52.617 kg)  08/08/14 115 lb (52.164 kg)  05/30/14 120 lb (54.432 kg)  03/22/14 124 lb 12.5 oz (56.6 kg)  03/15/14 119 lb 14.9 oz (54.4 kg)  02/23/14 122 lb (55.339 kg)  02/16/14 123 lb 3.2 oz (55.883 kg)  02/09/14 116 lb 13.5 oz (53 kg)  11/30/13 130 lb (58.968 kg)  11/08/13 130 lb (58.968 kg)    Usual Body Weight: 130 lbs   % Usual Body Weight: 89%  BMI:  Body mass index is 18.73 kg/(m^2).  Estimated Nutritional Needs: Kcal: 1600-1800 Protein: 70-85 grams Fluid: >/= 1.5 L/day  Skin: intact  Diet Order: Diet heart healthy/carb modified  EDUCATION NEEDS: -No education needs identified at this time   Intake/Output Summary (Last 24 hours) at 08/29/14 1014 Last data filed at 08/29/14 0900  Gross per 24 hour  Intake     60 ml  Output    925 ml  Net   -865 ml    Last BM: 12/26  Labs:   Recent Labs Lab 08/27/14 1417 08/28/14 0758  NA 141 138  K 3.8 3.3*  CL 103 103  CO2  --  27  BUN 26* 16  CREATININE 1.00 0.90  CALCIUM  --  9.5  GLUCOSE 91 91    CBG (last 3)   Recent Labs  08/28/14 1646 08/28/14 2134 08/29/14 0745  GLUCAP 136* 85 79    Scheduled Meds: . cefTRIAXone (ROCEPHIN)  IV  1 g Intravenous Q24H  . cloNIDine  0.3 mg Transdermal Weekly  . [START ON 09/01/2014] cyanocobalamin  1,000 mcg Subcutaneous Q30 days  . donepezil  10 mg Oral QHS  . ferrous sulfate  325 mg Oral BID WC  . fluticasone  2 spray Each Nare Daily  . insulin aspart  0-9 Units Subcutaneous TID WC  . insulin glargine  23 Units Subcutaneous Daily  . labetalol  400 mg Oral BID  . pantoprazole  40 mg Oral Daily  . polyethylene glycol  17 g Oral BID  . pravastatin  20 mg Oral QPM  . sodium chloride  3 mL Intravenous Q12H  . warfarin  2 mg Oral ONCE-1800  . Warfarin - Pharmacist Dosing Inpatient    Does not apply q1800    Continuous Infusions:   Past Medical History  Diagnosis Date  . POSTHERPETIC NEURALGIA 11/02/2009  . B12 DEFICIENCY 05/23/2007  . HYPERLIPIDEMIA 05/23/2007  . HYPERKALEMIA 05/30/2010  . ANEMIA-IRON DEFICIENCY 01/26/2007  . BLEPHARITIS, BILATERAL 02/23/2008  . Other specified forms of hearing loss 05/30/2010  . HYPERTENSION 01/26/2007  . Atrial fibrillation 05/23/2007  . DIASTOLIC HEART FAILURE, CHRONIC 12/14/2009  . POSTURAL HYPOTENSION 02/07/2008  . ALLERGIC RHINITIS 11/02/2009  . GERD 01/26/2007  . CONSTIPATION 01/25/2008  . SKIN LESION 05/30/2010  . BACK PAIN 08/02/2007  . OSTEOPOROSIS 05/23/2007  . SYNCOPE 02/07/2008  . FATIGUE 09/06/2007  . Dysuria 09/05/2008  . Abdominal pain, epigastric 09/06/2007  . EPIGASTRIC TENDERNESS 10/12/2007  . PULMONARY EMBOLISM, HX OF 05/23/2007  . SHINGLES, HX OF 05/23/2007  . 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 Left foot pain   . Diabetic neuropathy   . RENAL INSUFFICIENCY 07/02/2009  . RENAL CYST, LEFT 09/06/2007  . Kidney stones     "never had OR"  . Family history of anesthesia complication     "daughter has PONV; another daughter a little anesthesia lasts way too long"  . CHF (congestive heart failure) 2011  . Myocardial infarction 2011    "mild"  . DVT (deep venous thrombosis) ~ 1964    "think it was in the left"  . Aspiration pneumonia ~ 2006    "due to aspiration"  . DIABETES MELLITUS, TYPE II 09/06/2007  . DEPRESSIVE DISORDER 01/25/2008    "@ times; never treated for it"  . Melanoma of nose   . Compression fracture of lumbar vertebra     hx  . Compression fracture of thoracic vertebra 02/07/2014    "fell this am" (02/07/2014  . Arthritis     "hands" (03/15/2014)    Past Surgical History  Procedure Laterality Date  . Appendectomy  1943  . Abdominal hysterectomy  1964  . Cardiac electrophysiology mapping and ablation  1999  . Cataract extraction w/ intraocular lens  implant, bilateral  2003-2005  . Carpal tunnel release Right  2004  . Mohs surgery  2011    "removed off her nose"  . Cardiac catheterization  10/2009  . Esophageal dilation  X 2    Kallie Locks, MS, RD, LDN Pager # [phone] After hours/ weekend pager # [phone]

## 2014-08-29 NOTE — Progress Notes (Addendum)
  ANTICOAGULATION CONSULT NOTE - Follow Up Consult  Pharmacy Consult for Warfarin Indication: atrial fibrillation  Allergies  Allergen Reactions  . Amiodarone Other (See Comments)    REACTION: f? fluid retention  . Deltasone [Prednisone] Other (See Comments)    unknown  . Diltiazem Hcl Other (See Comments)    unknown  . Levofloxacin Nausea And Vomiting    REACTION: nausea  . Pregabalin Other (See Comments)    REACTION: hallucinations  . Spironolactone Other (See Comments)    REACTION: elevated K at lowest dose  . Tequin [Gatifloxacin] Other (See Comments)    Hypoglycemia  . Diazepam Anxiety and Other (See Comments)    REACTION: agitation    Patient Measurements: Height: 5\' 6"  (167.6 cm) Weight: 116 lb (52.617 kg) IBW/kg (Calculated) : 59.3  Vital Signs: Temp: 97.6 F (36.4 C) (12/29 0402) BP: 140/50 mmHg (12/29 0526) Pulse Rate: 90 (12/29 0428)  Labs:  Recent Labs  08/27/14 1417 08/27/14 1451 08/28/14 0758 08/29/14 0540  HGB 12.9 12.3 13.4 13.7  HCT 38.0 38.8 42.3 42.6  PLT  --  141* 152 148*  LABPROT  --  25.4* 21.5* 25.5*  INR  --  2.29* 1.85* 2.30*  CREATININE 1.00  --  0.90  --     Estimated Creatinine Clearance: 33.8 mL/min (by C-G formula based on Cr of 0.9).   Assessment: 80yo female on chronic warfarin for atrial fibrillation. INR therapeutic at 2.3 today up from 1.85. Cbc stable, plts 148. No s/s of bleed per chart.  PTA dose: 2mg  on MWF, 3 mg on TTSS per coumadin clinic note on 12/21  Goal of Therapy:  INR 2-3 Monitor platelets by anticoagulation protocol: Yes   Plan:  Give warfarin 2mg  PO tonight x 1 Monitor daily INR/CBC, s/s of bleed  Eldred Lievanos J 08/29/2014,8:10 AM

## 2014-08-29 NOTE — Discharge Summary (Signed)
Physician Discharge Summary  Lindsay Sheppard OZD:[id number] DOB: 20-Aug-1921 DOA: 08/27/2014  PCP: [name], MD  Admit date: 08/27/2014 Discharge date: 08/29/2014  Time spent: > 35 minutes  Recommendations for Outpatient Follow-up:  1. Continue to monitor INR levels 2. Patient will be discharged on Omnicef given her improvement on Rocephin 3. We'll treat for a total of 7 days 4. Please review urine sensitivities. 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 Presenting with elevated blood pressures and urinary tract infection. Blood pressures improved on oral home regimen and treatment for UTI.  Hospital Course:  Hypertension - Last several blood pressures well controlled and last blood pressure 130/50. Currently at goal as such we'll not add any more antihypertensive medications. - Improved with treatment of UTI name not sure if these correlate nonetheless we'll continue UTI treatment as outpatient  UTI - Growing Escherichia coli. Improved on cephalosporin. As such will discharge on [place]. - Recommend PCP follow-up with urine culture sensitivities for further evaluation recommendations  Procedures:  None  Consultations:  None  Discharge Exam: Filed Vitals:   08/29/14 1203  BP: 130/50  Pulse: 88  Temp:   Resp:      General: Pt in nad, alert and awake Cardiovascular: s1 and s2 present and wnl, no rubs Respiratory: cta bl, no wheezes  Discharge Instructions   Discharge Instructions    Call MD for:  severe uncontrolled pain    Complete by:  As directed      Call MD for:  severe uncontrolled pain    Complete by:  As directed      Call MD for:  temperature >100.4    Complete by:  As directed      Call MD for:  temperature >100.4    Complete by:  As directed      Diet - low sodium heart healthy    Complete by:  As directed      Diet - low sodium heart healthy    Complete by:  As directed      Discharge instructions    Complete by:  As directed   Please follow-up with your primary care physician within the next 1-2 weeks.     Increase activity slowly    Complete by:  As directed      Increase activity slowly    Complete by:  As directed           Current Discharge Medication List    START taking these medications   Details  cefdinir (OMNICEF) 300 MG capsule Take 1 capsule (300 mg total) by mouth 2 (two) times daily. Qty: 10 capsule, Refills: 0      CONTINUE these medications which have NOT CHANGED   Details  acetaminophen (TYLENOL) 325 MG tablet Take 2 tablets (650 mg total) by mouth 3 (three) times daily.    cloNIDine (CATAPRES -  DOSED IN MG/24 HR) 0.3 mg/24hr patch Place 0.3 mg onto the skin once a week.    CRANBERRY PO Take 500 mg by mouth daily.     cyanocobalamin (,VITAMIN B-12,) 1000 MCG/ML injection INJECT 1 ML (1000 MCG) INTO THE MUSCLE EVERY 30 DAYS. Qty: 1 mL, Refills: 11    !! donepezil (ARICEPT) 10 MG tablet Take 1 tablet (10 mg total) by mouth at bedtime. Qty: 90 tablet, Refills: 3    !! donepezil (ARICEPT) 5 MG tablet Take 5 mg by mouth at bedtime.    ferrous sulfate 325 (65 FE) MG tablet Take 1 tablet (325 mg total) by mouth 2 (two) times daily with a meal. Qty: 60 tablet, Refills: 1    fluticasone (FLONASE) 50 MCG/ACT nasal spray Place 2 sprays into the  nose daily. Qty: 16 g, Refills: 2    Hypromellose (ARTIFICIAL TEARS OP) Place 1 drop into both eyes daily.    insulin glargine (LANTUS) 100 UNIT/ML injection Inject 23 Units into the skin daily.     labetalol (NORMODYNE) 200 MG tablet Take 400 mg by mouth 2 (two) times daily.    pantoprazole (PROTONIX) 40 MG tablet Take 1 tablet (40 mg total) by mouth daily. Qty: 90 tablet, Refills: 3    polyethylene glycol (MIRALAX / GLYCOLAX) packet Take 17 g by mouth 2 (two) times daily. Qty: 14 each, Refills: 0    pravastatin (PRAVACHOL) 40 MG tablet Take 20 mg by mouth every evening.     warfarin (COUMADIN) 2 MG tablet Follow instructions per coumadin clinic Qty: 90 tablet, Refills: 0   Associated Diagnoses: Iatrogenic pulmonary embolism and infarction     !! - Potential duplicate medications found. Please discuss with provider.    STOP taking these medications     fluconazole (DIFLUCAN) 150 MG tablet      cephALEXin (KEFLEX) 500 MG capsule        Allergies  Allergen Reactions  . Amiodarone Other (See Comments)    REACTION: f? fluid retention  . Deltasone [Prednisone] Other (See Comments)    unknown  . Diltiazem Hcl Other (See Comments)    unknown  . Levofloxacin Nausea And Vomiting    REACTION: nausea  . Pregabalin Other (See Comments)    REACTION: hallucinations  . Spironolactone Other (See Comments)    REACTION: elevated K at lowest dose  . Tequin [Gatifloxacin] Other (See Comments)    Hypoglycemia  . Diazepam Anxiety and Other (See Comments)    REACTION: agitation   Follow-up Information    Follow up with [name], MD In 2 days.   Specialties:  Internal Medicine, Radiology   Contact information:   [address] [address] [phone]       Follow up with [facility].   Specialty:  Emergency Medicine   Why:  If symptoms worsen   Contact information:   [address] [id number] [address] [phone]       The results of significant diagnostics from this hospitalization (including imaging, microbiology, ancillary and laboratory) are listed below for reference.    Significant Diagnostic Studies: Ct Head Wo Contrast  08/27/2014   CLINICAL DATA:  Hypertension.  EXAM: CT HEAD WITHOUT CONTRAST  TECHNIQUE: Contiguous axial images were obtained from the base of the skull through the vertex without intravenous contrast.  COMPARISON:  Head CT scan 03/19/2014.  Brain MRI 03/21/2014.  FINDINGS: The brain is atrophic with mild appearing  chronic microvascular ischemic change. No evidence of acute intracranial abnormality including infarct, hemorrhage, mass lesion, mass effect, midline shift or abnormal extra-axial fluid collection. No hydrocephalus or pneumocephalus. The calvarium is intact. Imaged paranasal sinuses and mastoid air cells are clear.  IMPRESSION: No acute abnormality.  Atrophy and mild chronic microvascular ischemic change.   Electronically Signed   By: Inge Rise M.D.   On: 08/27/2014 19:15   Dg Chest Port 1 View  08/27/2014   CLINICAL DATA:  Weakness, hypertension  EXAM: PORTABLE CHEST - 1 VIEW  COMPARISON:  03/19/2014  FINDINGS: Cardiomediastinal silhouette is stable. Hyperinflation again noted. Atherosclerotic calcifications of thoracic aorta. No acute infiltrate or pleural effusion. No pulmonary edema.  IMPRESSION: No active disease.  Hyperinflation again noted.   Electronically Signed   By: Lahoma Crocker M.D.   On: 08/27/2014 16:34    Microbiology: Recent Results (from the past 240 hour(s))  Urine culture     Status: None (Preliminary result)   Collection Time: 08/27/14  1:28 PM  Result Value Ref Range Status   Specimen Description URINE, CATHETERIZED  Final   Special Requests NONE  Final   Colony Count   Final    >=100,000 COLONIES/ML Performed at [facility]    Culture   Final    ESCHERICHIA COLI Performed at [facility]    Report Status PENDING  Incomplete     Labs: Basic Metabolic Panel:  Recent Labs Lab 08/27/14 1417 08/28/14 0758  NA 141 138  K 3.8 3.3*  CL 103 103  CO2  --  27  GLUCOSE 91 91  BUN 26* 16  CREATININE 1.00 0.90  CALCIUM  --  9.5   Liver Function Tests:  Recent Labs Lab 08/28/14 0758  AST 19  ALT 19  ALKPHOS 62  BILITOT 0.6  PROT 5.8*  ALBUMIN 3.5   No results for input(s): LIPASE, AMYLASE in the last 168 hours. No results for input(s): AMMONIA in the last 168 hours. CBC:  Recent Labs Lab 08/27/14 1417 08/27/14 1451 08/28/14 0758 08/29/14 0540  WBC  --  4.3 5.4 9.3  NEUTROABS  --  3.2 4.3  --   HGB 12.9 12.3 13.4 13.7  HCT 38.0 38.8 42.3 42.6  MCV  --  87.2 87.9 86.1  PLT  --  141* 152 148*   Cardiac Enzymes: No results for input(s): CKTOTAL, CKMB, CKMBINDEX, TROPONINI in the last 168 hours. BNP: BNP (last 3 results) No results for input(s): PROBNP in the last 8760 hours. CBG:  Recent Labs Lab 08/28/14 1150 08/28/14 1646 08/28/14 2134 08/29/14 0745 08/29/14 1124  GLUCAP 157* 136* 85 79 175*       Signed:  Masen Salvas  [facility] 08/29/2014, 2:25 PM

## 2014-08-29 NOTE — Care Management Note (Signed)
CARE MANAGEMENT NOTE 08/29/2014  Patient:  Lindsay Sheppard, Lindsay Sheppard   Account Number:  [id number]  Date Initiated:  08/29/2014  Documentation initiated by:  Meliss Fleek  Subjective/Objective Assessment:   CM following for progression and d/c planning.     Action/Plan:   Pt cared for by family 24/7, with [facility] for [facility] services and family wishes to continue [facility] services. [place] notified.   Anticipated DC Date:  08/31/2014   Anticipated DC Plan:  [place] [facility]         Choice offered to / List presented to:          [facility] arranged  HH-1 RN      [place].   Status of service:  Completed, signed off Medicare Important Message given?  NA - LOS <3 / Initial given by admissions (If response is "NO", the following Medicare IM given date fields will be blank) Date Medicare IM given:   Medicare IM given by:   Date Additional Medicare IM given:   Additional Medicare IM given by:    Discharge Disposition:  [place]  Per UR Regulation:    If discussed at Long Length of Stay Meetings, dates discussed:    Comments:

## 2014-08-29 NOTE — Care Management Note (Signed)
CARE MANAGEMENT NOTE 08/29/2014  Patient:  QUINA, WILBOURNE   Account Number:  [id number]  Date Initiated:  08/29/2014  Documentation initiated by:  Anshika Pethtel  Subjective/Objective Assessment:   CM following for progression and d/c planning.     Action/Plan:   Pt cared for by family 24/7, with [facility] for [facility] services and family wishes to continue [facility] services. [place] notified.   Anticipated DC Date:  08/31/2014   Anticipated DC Plan:  [place]         Choice offered to / List presented to:          [facility] arranged  HH-1 RN      [place].   Status of service:  In process, will continue to follow Medicare Important Message given?   (If response is "NO", the following Medicare IM given date fields will be blank) Date Medicare IM given:   Medicare IM given by:   Date Additional Medicare IM given:   Additional Medicare IM given by:    Discharge Disposition:    Per UR Regulation:    If discussed at Long Length of Stay Meetings, dates discussed:    Comments:

## 2014-08-29 NOTE — Progress Notes (Signed)
Discharge instructions given to caregiver and patient. Prescription given for antibiotic. NSL discontinued without difficulty. RN assisted family member with dressing pt, then escorted pt via wheelchair. Manya Silvas, RN

## 2014-08-30 ENCOUNTER — Telehealth: Payer: Self-pay | Admitting: Internal Medicine

## 2014-08-30 ENCOUNTER — Ambulatory Visit (INDEPENDENT_AMBULATORY_CARE_PROVIDER_SITE_OTHER): Payer: Medicare Other | Admitting: Family Medicine

## 2014-08-30 DIAGNOSIS — I482 Chronic atrial fibrillation, unspecified: Secondary | ICD-10-CM

## 2014-08-30 DIAGNOSIS — Z5181 Encounter for therapeutic drug level monitoring: Secondary | ICD-10-CM

## 2014-08-30 LAB — POCT INR: INR: 3.6

## 2014-08-30 NOTE — Telephone Encounter (Signed)
HHRN informed.  Also due to recent hospitalization the patient is weak and HHRN would like a verbal to do a PT consult.

## 2014-08-30 NOTE — Telephone Encounter (Signed)
HHRN informed 

## 2014-08-30 NOTE — Telephone Encounter (Signed)
Lindsay Sheppard  [phone]   [facility]-having issue getting her qualified for home health. She has a few question about diagnosis.

## 2014-08-30 NOTE — Telephone Encounter (Signed)
Edison for diagnosis perniscious anemia, thanks

## 2014-08-30 NOTE — Telephone Encounter (Signed)
Nicholas for verbal. Thanks  - PT eval and treat - gait disorder, general weakness

## 2014-08-30 NOTE — Telephone Encounter (Signed)
Medicare with not pay for home health to do the patients B12 injections based on her diagnosis being B12 deficiency.  HHRN states medicare will pay on if diagnosis is either pernicious anemia or tape worm anemia... Advise please.

## 2014-08-31 ENCOUNTER — Telehealth: Payer: Self-pay | Admitting: Internal Medicine

## 2014-08-31 NOTE — Telephone Encounter (Signed)
Evaluated patient today:  Therapy is as follows:  1week 1, 3week 3, and 2week 1.

## 2014-08-31 NOTE — Telephone Encounter (Signed)
Ok for verbal if needed 

## 2014-09-04 ENCOUNTER — Emergency Department (HOSPITAL_COMMUNITY): Payer: Medicare Other

## 2014-09-04 ENCOUNTER — Inpatient Hospital Stay (HOSPITAL_COMMUNITY)
Admission: EM | Admit: 2014-09-04 | Discharge: 2014-09-08 | DRG: 690 | Disposition: A | Discharge status: Home / Self Care | Payer: Medicare Other | Attending: Internal Medicine | Admitting: Internal Medicine

## 2014-09-04 ENCOUNTER — Encounter (HOSPITAL_COMMUNITY): Payer: Self-pay | Admitting: Emergency Medicine

## 2014-09-04 DIAGNOSIS — E875 Hyperkalemia: Secondary | ICD-10-CM | POA: Diagnosis present

## 2014-09-04 DIAGNOSIS — I5032 Chronic diastolic (congestive) heart failure: Secondary | ICD-10-CM | POA: Diagnosis present

## 2014-09-04 DIAGNOSIS — I482 Chronic atrial fibrillation, unspecified: Secondary | ICD-10-CM | POA: Diagnosis present

## 2014-09-04 DIAGNOSIS — F329 Major depressive disorder, single episode, unspecified: Secondary | ICD-10-CM | POA: Diagnosis present

## 2014-09-04 DIAGNOSIS — Z90710 Acquired absence of both cervix and uterus: Secondary | ICD-10-CM | POA: Diagnosis not present

## 2014-09-04 DIAGNOSIS — I129 Hypertensive chronic kidney disease with stage 1 through stage 4 chronic kidney disease, or unspecified chronic kidney disease: Secondary | ICD-10-CM | POA: Diagnosis present

## 2014-09-04 DIAGNOSIS — N390 Urinary tract infection, site not specified: Principal | ICD-10-CM | POA: Diagnosis present

## 2014-09-04 DIAGNOSIS — J309 Allergic rhinitis, unspecified: Secondary | ICD-10-CM | POA: Diagnosis present

## 2014-09-04 DIAGNOSIS — K219 Gastro-esophageal reflux disease without esophagitis: Secondary | ICD-10-CM | POA: Diagnosis present

## 2014-09-04 DIAGNOSIS — B029 Zoster without complications: Secondary | ICD-10-CM | POA: Diagnosis present

## 2014-09-04 DIAGNOSIS — K5900 Constipation, unspecified: Secondary | ICD-10-CM | POA: Diagnosis present

## 2014-09-04 DIAGNOSIS — R634 Abnormal weight loss: Secondary | ICD-10-CM | POA: Diagnosis present

## 2014-09-04 DIAGNOSIS — H918X9 Other specified hearing loss, unspecified ear: Secondary | ICD-10-CM | POA: Diagnosis present

## 2014-09-04 DIAGNOSIS — Z681 Body mass index (BMI) 19 or less, adult: Secondary | ICD-10-CM | POA: Diagnosis not present

## 2014-09-04 DIAGNOSIS — N183 Chronic kidney disease, stage 3 (moderate): Secondary | ICD-10-CM | POA: Diagnosis present

## 2014-09-04 DIAGNOSIS — Z86718 Personal history of other venous thrombosis and embolism: Secondary | ICD-10-CM | POA: Diagnosis not present

## 2014-09-04 DIAGNOSIS — Z7901 Long term (current) use of anticoagulants: Secondary | ICD-10-CM

## 2014-09-04 DIAGNOSIS — Z86711 Personal history of pulmonary embolism: Secondary | ICD-10-CM

## 2014-09-04 DIAGNOSIS — R0989 Other specified symptoms and signs involving the circulatory and respiratory systems: Secondary | ICD-10-CM

## 2014-09-04 DIAGNOSIS — N184 Chronic kidney disease, stage 4 (severe): Secondary | ICD-10-CM | POA: Diagnosis present

## 2014-09-04 DIAGNOSIS — R109 Unspecified abdominal pain: Secondary | ICD-10-CM | POA: Diagnosis present

## 2014-09-04 DIAGNOSIS — N189 Chronic kidney disease, unspecified: Secondary | ICD-10-CM | POA: Diagnosis present

## 2014-09-04 DIAGNOSIS — E538 Deficiency of other specified B group vitamins: Secondary | ICD-10-CM | POA: Diagnosis present

## 2014-09-04 DIAGNOSIS — E785 Hyperlipidemia, unspecified: Secondary | ICD-10-CM | POA: Diagnosis present

## 2014-09-04 DIAGNOSIS — Z515 Encounter for palliative care: Secondary | ICD-10-CM | POA: Diagnosis not present

## 2014-09-04 DIAGNOSIS — Z794 Long term (current) use of insulin: Secondary | ICD-10-CM

## 2014-09-04 DIAGNOSIS — E1165 Type 2 diabetes mellitus with hyperglycemia: Secondary | ICD-10-CM | POA: Diagnosis present

## 2014-09-04 DIAGNOSIS — F0390 Unspecified dementia without behavioral disturbance: Secondary | ICD-10-CM | POA: Diagnosis present

## 2014-09-04 DIAGNOSIS — M810 Age-related osteoporosis without current pathological fracture: Secondary | ICD-10-CM | POA: Diagnosis present

## 2014-09-04 DIAGNOSIS — I252 Old myocardial infarction: Secondary | ICD-10-CM | POA: Diagnosis not present

## 2014-09-04 DIAGNOSIS — M1990 Unspecified osteoarthritis, unspecified site: Secondary | ICD-10-CM | POA: Diagnosis present

## 2014-09-04 DIAGNOSIS — E1140 Type 2 diabetes mellitus with diabetic neuropathy, unspecified: Secondary | ICD-10-CM | POA: Diagnosis present

## 2014-09-04 DIAGNOSIS — I10 Essential (primary) hypertension: Secondary | ICD-10-CM | POA: Diagnosis present

## 2014-09-04 DIAGNOSIS — E119 Type 2 diabetes mellitus without complications: Secondary | ICD-10-CM

## 2014-09-04 DIAGNOSIS — N1330 Unspecified hydronephrosis: Secondary | ICD-10-CM

## 2014-09-04 DIAGNOSIS — K6389 Other specified diseases of intestine: Secondary | ICD-10-CM | POA: Diagnosis present

## 2014-09-04 DIAGNOSIS — R52 Pain, unspecified: Secondary | ICD-10-CM

## 2014-09-04 LAB — COMPREHENSIVE METABOLIC PANEL
ALT: 19 U/L (ref 0–35)
ANION GAP: 7 (ref 5–15)
AST: 22 U/L (ref 0–37)
Albumin: 3.3 g/dL — ABNORMAL LOW (ref 3.5–5.2)
Alkaline Phosphatase: 62 U/L (ref 39–117)
BUN: 29 mg/dL — AB (ref 6–23)
CO2: 26 mmol/L (ref 19–32)
CREATININE: 1.11 mg/dL — AB (ref 0.50–1.10)
Calcium: 9.5 mg/dL (ref 8.4–10.5)
Chloride: 104 mEq/L (ref 96–112)
GFR, EST AFRICAN AMERICAN: 49 mL/min — AB (ref >90)
GFR, EST NON AFRICAN AMERICAN: 42 mL/min — AB (ref >90)
GLUCOSE: 179 mg/dL — AB (ref 70–99)
Potassium: 4.3 mmol/L (ref 3.5–5.1)
Sodium: 137 mmol/L (ref 135–145)
Total Bilirubin: 0.3 mg/dL (ref 0.3–1.2)
Total Protein: 5.7 g/dL — ABNORMAL LOW (ref 6.0–8.3)

## 2014-09-04 LAB — CBC WITH DIFFERENTIAL/PLATELET
Basophils Absolute: 0 10*3/uL (ref 0.0–0.1)
Basophils Relative: 0 % (ref 0–1)
EOS PCT: 2 % (ref 0–5)
Eosinophils Absolute: 0.1 10*3/uL (ref 0.0–0.7)
HCT: 39.1 % (ref 36.0–46.0)
Hemoglobin: 12.5 g/dL (ref 12.0–15.0)
LYMPHS ABS: 0.7 10*3/uL (ref 0.7–4.0)
LYMPHS PCT: 14 % (ref 12–46)
MCH: 28.5 pg (ref 26.0–34.0)
MCHC: 32 g/dL (ref 30.0–36.0)
MCV: 89.3 fL (ref 78.0–100.0)
MONO ABS: 0.5 10*3/uL (ref 0.1–1.0)
Monocytes Relative: 11 % (ref 3–12)
Neutro Abs: 3.3 10*3/uL (ref 1.7–7.7)
Neutrophils Relative %: 73 % (ref 43–77)
Platelets: 163 10*3/uL (ref 150–400)
RBC: 4.38 MIL/uL (ref 3.87–5.11)
RDW: 15.4 % (ref 11.5–15.5)
WBC: 4.5 10*3/uL (ref 4.0–10.5)

## 2014-09-04 LAB — URINALYSIS, ROUTINE W REFLEX MICROSCOPIC
Bilirubin Urine: NEGATIVE
GLUCOSE, UA: 250 mg/dL — AB
Hgb urine dipstick: NEGATIVE
Ketones, ur: NEGATIVE mg/dL
Nitrite: NEGATIVE
Protein, ur: 30 mg/dL — AB
Specific Gravity, Urine: 1.017 (ref 1.005–1.030)
UROBILINOGEN UA: 0.2 mg/dL (ref 0.0–1.0)
pH: 7 (ref 5.0–8.0)

## 2014-09-04 LAB — URINE MICROSCOPIC-ADD ON

## 2014-09-04 LAB — PROTIME-INR
INR: 2.25 — AB (ref 0.00–1.49)
PROTHROMBIN TIME: 25 s — AB (ref 11.6–15.2)

## 2014-09-04 LAB — TROPONIN I

## 2014-09-04 MED ORDER — IOHEXOL 300 MG/ML  SOLN
25.0000 mL | INTRAMUSCULAR | Status: AC
  Administered 2014-09-04 (×2): 25 mL via ORAL

## 2014-09-04 MED ORDER — IOHEXOL 300 MG/ML  SOLN
100.0000 mL | Freq: Once | INTRAMUSCULAR | Status: AC | PRN
  Administered 2014-09-04: 100 mL via INTRAVENOUS

## 2014-09-04 MED ORDER — LABETALOL HCL 200 MG PO TABS
200.0000 mg | ORAL_TABLET | Freq: Once | ORAL | Status: AC
  Administered 2014-09-04: 200 mg via ORAL
  Filled 2014-09-04: qty 1

## 2014-09-04 MED ORDER — DEXTROSE 5 % IV SOLN
1.0000 g | Freq: Once | INTRAVENOUS | Status: AC
  Administered 2014-09-04: 1 g via INTRAVENOUS
  Filled 2014-09-04: qty 10

## 2014-09-04 NOTE — ED Notes (Addendum)
Pt brought from home by EMS, report pt admitted 08-27-14 for a UTI, finished ABX Omniceft last night.  Report new onset L flank pain last night.  Pt's family reports pt treasted for UTI mid-December, took 10 days of Keflex.  Pt hypertensive, per EMS consistent with pt's norm with UTI.  Pt's family reports increased frequency, strong odor.  NAD noted at this time.

## 2014-09-04 NOTE — ED Notes (Signed)
CT notified pt finished contrast  

## 2014-09-04 NOTE — ED Notes (Signed)
Attempted to give report 

## 2014-09-04 NOTE — ED Provider Notes (Addendum)
CSN: [id number]     Arrival date & time 09/04/14  1656 History   First MD Initiated Contact with Patient 09/04/14 1701     Chief Complaint  Patient presents with  . Flank Pain     (Consider location/radiation/quality/duration/timing/severity/associated sxs/prior Treatment) HPI Complains of left-sided flank pain onset today. Symptoms accompanied by urinary frequency. Not made better or worse by anything. No vomiting no shortness of breath no chest pain no cough no other associated symptoms. No treatment prior to coming here present pain is mild. Patient recently treated for urinary tract infection. Nothing makes symptoms better or worse. Pain is nonradiating Past Medical History  Diagnosis Date  . POSTHERPETIC NEURALGIA 11/02/2009  . B12 DEFICIENCY 05/23/2007  . HYPERLIPIDEMIA 05/23/2007  . HYPERKALEMIA 05/30/2010  . ANEMIA-IRON DEFICIENCY 01/26/2007  . BLEPHARITIS, BILATERAL 02/23/2008  . Other specified forms of hearing loss 05/30/2010  . HYPERTENSION 01/26/2007  . Atrial fibrillation 05/23/2007  . DIASTOLIC HEART FAILURE, CHRONIC 12/14/2009  . POSTURAL HYPOTENSION 02/07/2008  . ALLERGIC RHINITIS 11/02/2009  . GERD 01/26/2007  . CONSTIPATION 01/25/2008  . SKIN LESION 05/30/2010  . BACK PAIN 08/02/2007  . OSTEOPOROSIS 05/23/2007  . SYNCOPE 02/07/2008  . FATIGUE 09/06/2007  . Dysuria 09/05/2008  . Abdominal pain, epigastric 09/06/2007  . EPIGASTRIC TENDERNESS 10/12/2007  . PULMONARY EMBOLISM, HX OF 05/23/2007  . SHINGLES, HX OF 05/23/2007  . 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 Left foot pain   . Diabetic neuropathy   . RENAL INSUFFICIENCY 07/02/2009  . RENAL CYST, LEFT 09/06/2007  . Kidney stones     "never had OR"  . Family history of anesthesia complication     "daughter has PONV; another daughter a little anesthesia lasts way too long"  . CHF (congestive heart failure) 2011  . Myocardial infarction 2011    "mild"  . DVT (deep venous thrombosis) ~ 1964    "think it was in the left"  . Aspiration  pneumonia ~ 2006    "due to aspiration"  . DIABETES MELLITUS, TYPE II 09/06/2007  . DEPRESSIVE DISORDER 01/25/2008    "@ times; never treated for it"  . Melanoma of nose   . Compression fracture of lumbar vertebra     hx  . Compression fracture of thoracic vertebra 02/07/2014    "fell this am" (02/07/2014  . Arthritis     "hands" (03/15/2014)   Past Surgical History  Procedure Laterality Date  . Appendectomy  1943  . Abdominal hysterectomy  1964  . Cardiac electrophysiology mapping and ablation  1999  . Cataract extraction w/ intraocular lens  implant, bilateral  2003-2005  . Carpal tunnel release Right 2004  . Mohs surgery  2011    "removed off her nose"  . Cardiac catheterization  10/2009  . Esophageal dilation  X 2   Family History  Problem Relation Age of Onset  . Breast cancer Mother   . Colon cancer Father   . Stroke Father     died with stroke postop  . Diabetes Sister     2 sisters  . Hypertension Other    History  Substance Use Topics  . Smoking status: Never Smoker   . Smokeless tobacco: Never Used  . Alcohol Use: No   OB History    No data available     Review of Systems  Constitutional: Negative.   HENT: Negative.   Respiratory: Negative.   Cardiovascular: Negative.   Gastrointestinal: Negative.   Genitourinary: Positive for frequency and flank pain.  Musculoskeletal:  Negative.   Skin: Negative.   Allergic/Immunologic: Positive for immunocompromised state.       Diabetic  Neurological: Negative.   Psychiatric/Behavioral: Negative.   All other systems reviewed and are negative.     Allergies  Amiodarone; Deltasone; Diltiazem hcl; Levofloxacin; Pregabalin; Spironolactone; Tequin; and Diazepam  Home Medications   Prior to Admission medications   Medication Sig Start Date End Date Taking? Authorizing Provider  acetaminophen (TYLENOL) 325 MG tablet Take 2 tablets (650 mg total) by mouth 3 (three) times daily. 02/09/14  Yes Delfina Redwood, MD   cefdinir (OMNICEF) 300 MG capsule Take 1 capsule (300 mg total) by mouth 2 (two) times daily. 08/29/14  Yes Velvet Bathe, MD  cloNIDine (CATAPRES - DOSED IN MG/24 HR) 0.3 mg/24hr patch Place 0.3 mg onto the skin once a week.   Yes Historical Provider, MD  CRANBERRY PO Take 500 mg by mouth daily.    Yes Historical Provider, MD  donepezil (ARICEPT) 10 MG tablet Take 1 tablet (10 mg total) by mouth at bedtime. 08/08/14  Yes Biagio Borg, MD  feeding supplement, GLUCERNA SHAKE, (GLUCERNA SHAKE) LIQD Take 237 mLs by mouth 2 (two) times daily between meals. Patient taking differently: Take 237 mLs by mouth daily.  08/29/14  Yes Velvet Bathe, MD  ferrous sulfate 325 (65 FE) MG tablet Take 1 tablet (325 mg total) by mouth 2 (two) times daily with a meal. 02/15/14  Yes Ivan Anchors Love, PA-C  fluticasone (FLONASE) 50 MCG/ACT nasal spray Place 2 sprays into the nose daily. 03/22/13  Yes Biagio Borg, MD  Hypromellose (ARTIFICIAL TEARS OP) Place 1 drop into both eyes daily.   Yes Historical Provider, MD  insulin glargine (LANTUS) 100 UNIT/ML injection Inject 23 Units into the skin daily.    Yes Historical Provider, MD  labetalol (NORMODYNE) 200 MG tablet Take 400 mg by mouth 2 (two) times daily.   Yes Historical Provider, MD  pantoprazole (PROTONIX) 40 MG tablet Take 1 tablet (40 mg total) by mouth daily. 07/20/13  Yes Biagio Borg, MD  polyethylene glycol [facility] / Floria Raveling) packet Take 17 g by mouth 2 (two) times daily. 02/15/14  Yes Ivan Anchors Love, PA-C  pravastatin (PRAVACHOL) 40 MG tablet Take 20 mg by mouth every evening.  07/20/13  Yes Biagio Borg, MD  warfarin (COUMADIN) 2 MG tablet Follow instructions per coumadin clinic Patient taking differently: Take 2-3 mg by mouth daily. Take 3 mg daily EXCEPT 2mg  on MWF 07/13/14  Yes Biagio Borg, MD  cyanocobalamin (,VITAMIN B-12,) 1000 MCG/ML injection INJECT 1 ML (1000 MCG) INTO THE MUSCLE EVERY 30 DAYS. 04/25/14   Biagio Borg, MD   BP 209/90 mmHg  Pulse 85   Temp(Src) 98.4 F (36.9 C) (Oral)  Resp 26  Ht 5\' 7"  (1.702 m)  Wt 118 lb (53.524 kg)  BMI 18.48 kg/m2  SpO2 98% Physical Exam  Constitutional: She appears well-developed and well-nourished.  HENT:  Head: Normocephalic and atraumatic.  Eyes: Conjunctivae are normal. Pupils are equal, round, and reactive to light.  Neck: Neck supple. No tracheal deviation present. No thyromegaly present.  Cardiovascular: Normal rate and regular rhythm.   No murmur heard. Pulmonary/Chest: Effort normal.  Scant dry crackles  Abdominal: Soft. Bowel sounds are normal. She exhibits no distension. There is tenderness. There is no rebound and no guarding.  Tender at left upper quadrant and left lower quadrants  Genitourinary:  No flank tenderness  Musculoskeletal: Normal range of motion. She exhibits  no edema or tenderness.  Neurological: She is alert. Coordination normal.  Skin: Skin is warm and dry. No rash noted.  Psychiatric: She has a normal mood and affect.  Nursing note and vitals reviewed.   ED Course  Procedures (including critical care time) Labs Review Labs Reviewed  URINALYSIS, ROUTINE W REFLEX MICROSCOPIC    Imaging Review No results found.   EKG Interpretation   Date/Time:  Monday September 04 2014 17:10:09 EST Ventricular Rate:  90 PR Interval:  188 QRS Duration: 136 QT Interval:  391 QTC Calculation: 478 R Axis:   -83 Text Interpretation:  Sinus rhythm RBBB and LAFB Probable left ventricular  hypertrophy Nonspecific T abnormalities, lateral leads Confirmed by  Winfred Leeds  MD, Rindi Beechy ([id number]) on 09/04/2014 6:29:56 PM     10:50 PM patient resting comfortably.  Results for orders placed or performed during the hospital encounter of 09/04/14  Urinalysis, Routine w reflex microscopic  Result Value Ref Range   Color, Urine YELLOW YELLOW   APPearance HAZY (A) CLEAR   Specific Gravity, Urine 1.017 1.005 - 1.030   pH 7.0 5.0 - 8.0   Glucose, UA 250 (A) NEGATIVE mg/dL   Hgb urine  dipstick NEGATIVE NEGATIVE   Bilirubin Urine NEGATIVE NEGATIVE   Ketones, ur NEGATIVE NEGATIVE mg/dL   Protein, ur 30 (A) NEGATIVE mg/dL   Urobilinogen, UA 0.2 0.0 - 1.0 mg/dL   Nitrite NEGATIVE NEGATIVE   Leukocytes, UA SMALL (A) NEGATIVE  Comprehensive metabolic panel  Result Value Ref Range   Sodium 137 135 - 145 mmol/L   Potassium 4.3 3.5 - 5.1 mmol/L   Chloride 104 96 - 112 mEq/L   CO2 26 19 - 32 mmol/L   Glucose, Bld 179 (H) 70 - 99 mg/dL   BUN 29 (H) 6 - 23 mg/dL   Creatinine, Ser 1.11 (H) 0.50 - 1.10 mg/dL   Calcium 9.5 8.4 - 10.5 mg/dL   Total Protein 5.7 (L) 6.0 - 8.3 g/dL   Albumin 3.3 (L) 3.5 - 5.2 g/dL   AST 22 0 - 37 U/L   ALT 19 0 - 35 U/L   Alkaline Phosphatase 62 39 - 117 U/L   Total Bilirubin 0.3 0.3 - 1.2 mg/dL   GFR calc non Af Amer 42 (L) >90 mL/min   GFR calc Af Amer 49 (L) >90 mL/min   Anion gap 7 5 - 15  CBC with Differential  Result Value Ref Range   WBC 4.5 4.0 - 10.5 K/uL   RBC 4.38 3.87 - 5.11 MIL/uL   Hemoglobin 12.5 12.0 - 15.0 g/dL   HCT 39.1 36.0 - 46.0 %   MCV 89.3 78.0 - 100.0 fL   MCH 28.5 26.0 - 34.0 pg   MCHC 32.0 30.0 - 36.0 g/dL   RDW 15.4 11.5 - 15.5 %   Platelets 163 150 - 400 K/uL   Neutrophils Relative % 73 43 - 77 %   Neutro Abs 3.3 1.7 - 7.7 K/uL   Lymphocytes Relative 14 12 - 46 %   Lymphs Abs 0.7 0.7 - 4.0 K/uL   Monocytes Relative 11 3 - 12 %   Monocytes Absolute 0.5 0.1 - 1.0 K/uL   Eosinophils Relative 2 0 - 5 %   Eosinophils Absolute 0.1 0.0 - 0.7 K/uL   Basophils Relative 0 0 - 1 %   Basophils Absolute 0.0 0.0 - 0.1 K/uL  Troponin I  Result Value Ref Range   Troponin I <0.03 <0.031 ng/mL  Urine microscopic-add  on  Result Value Ref Range   Squamous Epithelial / LPF FEW (A) RARE   WBC, UA 21-50 <3 WBC/hpf   Bacteria, UA RARE RARE  Protime-INR  Result Value Ref Range   Prothrombin Time 25.0 (H) 11.6 - 15.2 seconds   INR 2.25 (H) 0.00 - 1.49   Dg Chest 2 View  09/04/2014   CLINICAL DATA:  Chest rales.  EXAM:  CHEST  2 VIEW  COMPARISON:  Radiograph 08/27/2014  FINDINGS: Patient is rotated to the right. Diminished hyperinflation from prior. Cardiomediastinal contours are normal and unchanged. Atherosclerosis noted of the aortic arch. Pulmonary vasculature is normal. Minimal atelectasis or scarring in the left midlung zone. Decreased right basilar atelectasis. No confluent airspace disease. No pleural effusion or pneumothorax. The bones appear under mineralized. Compression deformities again noted in the thoracic spine  IMPRESSION: No acute pulmonary process.   Electronically Signed   By: Jeb Levering M.D.   On: 09/04/2014 19:02   Ct Head Wo Contrast  08/27/2014   CLINICAL DATA:  Hypertension.  EXAM: CT HEAD WITHOUT CONTRAST  TECHNIQUE: Contiguous axial images were obtained from the base of the skull through the vertex without intravenous contrast.  COMPARISON:  Head CT scan 03/19/2014.  Brain MRI 03/21/2014.  FINDINGS: The brain is atrophic with mild appearing chronic microvascular ischemic change. No evidence of acute intracranial abnormality including infarct, hemorrhage, mass lesion, mass effect, midline shift or abnormal extra-axial fluid collection. No hydrocephalus or pneumocephalus. The calvarium is intact. Imaged paranasal sinuses and mastoid air cells are clear.  IMPRESSION: No acute abnormality.  Atrophy and mild chronic microvascular ischemic change.   Electronically Signed   By: Inge Rise M.D.   On: 08/27/2014 19:15   Ct Abdomen Pelvis W Contrast  09/04/2014   CLINICAL DATA:  Lower abdomen and left flank pain. Recently treated for a urinary tract infection. Previous appendectomy and hysterectomy.  EXAM: CT ABDOMEN AND PELVIS WITH CONTRAST  TECHNIQUE: Multidetector CT imaging of the abdomen and pelvis was performed using the standard protocol following bolus administration of intravenous contrast.  CONTRAST:  184mL OMNIPAQUE IOHEXOL 300 MG/ML  SOLN  COMPARISON:  Pelvis CT dated 06/23/2008 and  abdomen CT dated 01/28/2008.  FINDINGS: The previously demonstrated 2.9 cm fat containing left adrenal mass currently measures 2.8 x 1.6 cm on image 19. There is a large mass in the right colon extending superior and inferior to the ileocecal valve. On coronal image number 58, this measures 5.8 x 4.6 cm. On axial image number 41, this measures 4.2 cm in AP diameter.  Increased prominence of bilateral extrarenal pelves with interval distention of the urinary bladder. Mild inferior bladder wall thickening as well as partial vaginal prolapse of the bladder. No urinary tract calculi are seen.  Mild diffuse low density of the liver relative to the spleen. 1.2 x 0.7 cm polyp or non calcified gallstone in the gallbladder fundus on image number 29. Unremarkable spleen, pancreas and right adrenal gland. The previously demonstrated 1.2 x 1.0 cm mid left renal mass suspicious for a primary renal neoplasm is slightly larger, currently measuring 1.6 x 1.3 cm on image number 27. This measures 72 Hounsfield units in density.  Old, healed bilateral pubic fractures. Diffuse osteopenia. Multiple old lumbar and lower thoracic vertebral compression fractures. Some of these are associated with bony retropulsion. This is most pronounced at the upper L2 level, with moderate canal stenosis. No acute fracture lines are seen. Atheromatous arterial calcifications. Clear lung bases.  IMPRESSION: 1. 5.8  x 4.6 x 4.2 cm polypoid right colon mass extending superior and inferior to the ileocecal valve. This is concerning for a primary colon neoplasm. 2. Mild increase in size of the previously demonstrated left renal mass suspicious for a renal cell carcinoma on a previous MR. 3. Prolapse of the urinary bladder into the upper vagina. 4. Mild inferior bladder wall thickening. This could be due to plaque-like tumor or reactive changes due to chronic prolapse into the vagina. 5. Distention of the urinary bladder with associated mild bilateral  hydronephrosis. 6. 1.2 cm arm polyp or noncalcified gallstone in the gallbladder without evidence of cholecystitis. 7. Stable left adrenal myelolipoma. 8. Diffuse osteopenia with old vertebral compression fractures and old, healed bilateral pubic fractures. 9. Mild diffuse hepatic steatosis.   Electronically Signed   By: Enrique Sack M.D.   On: 09/04/2014 21:36   Dg Chest Port 1 View  08/27/2014   CLINICAL DATA:  Weakness, hypertension  EXAM: PORTABLE CHEST - 1 VIEW  COMPARISON:  03/19/2014  FINDINGS: Cardiomediastinal silhouette is stable. Hyperinflation again noted. Atherosclerotic calcifications of thoracic aorta. No acute infiltrate or pleural effusion. No pulmonary edema.  IMPRESSION: No active disease.  Hyperinflation again noted.   Electronically Signed   By: Lahoma Crocker M.D.   On: 08/27/2014 16:34    MDM  Patient is failed to separate courses of antibiotics for urinary tract infection. I suspect that she is immune compromised secondary to cancer she also has bilateral hydronephrosis , also contributing I spoke with Dr. Hal Hope plan admit medical surgical floor. Patient will likely need surgical and/or urologic consultations Final diagnoses:  None  Dx #1 utrinary tract infection  #2 hyperglycemia #3 colonic mass #4renal  mass #5 hypertension     Orlie Dakin, MD 09/04/14 [id number]  Orlie Dakin, MD 09/04/14 2312

## 2014-09-05 ENCOUNTER — Encounter (HOSPITAL_COMMUNITY): Payer: Self-pay | Admitting: Internal Medicine

## 2014-09-05 ENCOUNTER — Inpatient Hospital Stay (HOSPITAL_COMMUNITY): Payer: Medicare Other

## 2014-09-05 ENCOUNTER — Telehealth: Payer: Self-pay

## 2014-09-05 DIAGNOSIS — N189 Chronic kidney disease, unspecified: Secondary | ICD-10-CM

## 2014-09-05 DIAGNOSIS — I10 Essential (primary) hypertension: Secondary | ICD-10-CM

## 2014-09-05 DIAGNOSIS — I482 Chronic atrial fibrillation: Secondary | ICD-10-CM

## 2014-09-05 DIAGNOSIS — R109 Unspecified abdominal pain: Secondary | ICD-10-CM

## 2014-09-05 DIAGNOSIS — K6389 Other specified diseases of intestine: Secondary | ICD-10-CM | POA: Diagnosis present

## 2014-09-05 DIAGNOSIS — N390 Urinary tract infection, site not specified: Principal | ICD-10-CM

## 2014-09-05 LAB — CBC WITH DIFFERENTIAL/PLATELET
BASOS ABS: 0 10*3/uL (ref 0.0–0.1)
BASOS PCT: 0 % (ref 0–1)
EOS ABS: 0.1 10*3/uL (ref 0.0–0.7)
Eosinophils Relative: 2 % (ref 0–5)
HCT: 38.4 % (ref 36.0–46.0)
Hemoglobin: 12.5 g/dL (ref 12.0–15.0)
Lymphocytes Relative: 17 % (ref 12–46)
Lymphs Abs: 0.8 10*3/uL (ref 0.7–4.0)
MCH: 29 pg (ref 26.0–34.0)
MCHC: 32.6 g/dL (ref 30.0–36.0)
MCV: 89.1 fL (ref 78.0–100.0)
MONOS PCT: 9 % (ref 3–12)
Monocytes Absolute: 0.5 10*3/uL (ref 0.1–1.0)
NEUTROS PCT: 72 % (ref 43–77)
Neutro Abs: 3.5 10*3/uL (ref 1.7–7.7)
Platelets: 172 10*3/uL (ref 150–400)
RBC: 4.31 MIL/uL (ref 3.87–5.11)
RDW: 15.5 % (ref 11.5–15.5)
WBC: 4.9 10*3/uL (ref 4.0–10.5)

## 2014-09-05 LAB — GLUCOSE, CAPILLARY
Glucose-Capillary: 73 mg/dL (ref 70–99)
Glucose-Capillary: 197 mg/dL — ABNORMAL HIGH (ref 70–99)
Glucose-Capillary: 121 mg/dL — ABNORMAL HIGH (ref 70–99)
Glucose-Capillary: 140 mg/dL — ABNORMAL HIGH (ref 70–99)

## 2014-09-05 LAB — COMPREHENSIVE METABOLIC PANEL
ALT: 22 U/L (ref 0–35)
ANION GAP: 7 (ref 5–15)
AST: 23 U/L (ref 0–37)
Albumin: 3.2 g/dL — ABNORMAL LOW (ref 3.5–5.2)
Alkaline Phosphatase: 59 U/L (ref 39–117)
BUN: 21 mg/dL (ref 6–23)
CO2: 30 mmol/L (ref 19–32)
Calcium: 9.6 mg/dL (ref 8.4–10.5)
Chloride: 102 mEq/L (ref 96–112)
Creatinine, Ser: 1.02 mg/dL (ref 0.50–1.10)
GFR calc Af Amer: 54 mL/min — ABNORMAL LOW (ref >90)
GFR calc non Af Amer: 47 mL/min — ABNORMAL LOW (ref >90)
Glucose, Bld: 89 mg/dL (ref 70–99)
POTASSIUM: 4 mmol/L (ref 3.5–5.1)
Sodium: 139 mmol/L (ref 135–145)
TOTAL PROTEIN: 5.4 g/dL — AB (ref 6.0–8.3)
Total Bilirubin: 0.4 mg/dL (ref 0.3–1.2)

## 2014-09-05 MED ORDER — LABETALOL HCL 5 MG/ML IV SOLN
10.0000 mg | INTRAVENOUS | Status: DC | PRN
  Administered 2014-09-06 – 2014-09-07 (×2): 10 mg via INTRAVENOUS
  Filled 2014-09-05 (×2): qty 4

## 2014-09-05 MED ORDER — ONDANSETRON HCL 4 MG PO TABS: 4.0000 mg | ORAL_TABLET | Freq: Four times a day (QID) | ORAL | Status: DC | PRN

## 2014-09-05 MED ORDER — FLUTICASONE PROPIONATE 50 MCG/ACT NA SUSP
2.0000 | Freq: Every day | NASAL | Status: DC
Start: 2014-09-05 — End: 2014-09-08
  Administered 2014-09-06 – 2014-09-08 (×3): 2 via NASAL
  Filled 2014-09-05: qty 16

## 2014-09-05 MED ORDER — PANTOPRAZOLE SODIUM 40 MG PO TBEC
40.0000 mg | DELAYED_RELEASE_TABLET | Freq: Every day | ORAL | Status: DC
  Administered 2014-09-05 – 2014-09-08 (×4): 40 mg via ORAL
  Filled 2014-09-05 (×4): qty 1

## 2014-09-05 MED ORDER — DONEPEZIL HCL 10 MG PO TABS
10.0000 mg | ORAL_TABLET | Freq: Every day | ORAL | Status: DC
  Administered 2014-09-05 – 2014-09-07 (×4): 10 mg via ORAL
  Filled 2014-09-05 (×4): qty 1

## 2014-09-05 MED ORDER — ACETAMINOPHEN 650 MG RE SUPP: 650.0000 mg | Freq: Four times a day (QID) | RECTAL | Status: DC | PRN

## 2014-09-05 MED ORDER — PIPERACILLIN-TAZOBACTAM 3.375 G IVPB
3.3750 g | Freq: Three times a day (TID) | INTRAVENOUS | Status: DC
  Administered 2014-09-05 – 2014-09-06 (×4): 3.375 g via INTRAVENOUS
  Filled 2014-09-05 (×6): qty 50

## 2014-09-05 MED ORDER — FERROUS SULFATE 325 (65 FE) MG PO TABS
325.0000 mg | ORAL_TABLET | Freq: Two times a day (BID) | ORAL | Status: DC
  Administered 2014-09-05 – 2014-09-08 (×7): 325 mg via ORAL
  Filled 2014-09-05 (×7): qty 1

## 2014-09-05 MED ORDER — MORPHINE SULFATE 2 MG/ML IJ SOLN
0.5000 mg | INTRAMUSCULAR | Status: DC | PRN
Start: 2014-09-05 — End: 2014-09-08

## 2014-09-05 MED ORDER — ACETAMINOPHEN 325 MG PO TABS: 650.0000 mg | ORAL_TABLET | Freq: Four times a day (QID) | ORAL | Status: DC | PRN

## 2014-09-05 MED ORDER — SODIUM CHLORIDE 0.9 % IV SOLN: INTRAVENOUS | Status: AC

## 2014-09-05 MED ORDER — SODIUM CHLORIDE 0.9 % IV SOLN
INTRAVENOUS | Status: AC
  Administered 2014-09-05: 01:00:00 via INTRAVENOUS

## 2014-09-05 MED ORDER — INSULIN GLARGINE 100 UNIT/ML ~~LOC~~ SOLN
15.0000 [IU] | Freq: Every day | SUBCUTANEOUS | Status: DC
  Administered 2014-09-05 – 2014-09-08 (×4): 15 [IU] via SUBCUTANEOUS
  Filled 2014-09-05 (×4): qty 0.15

## 2014-09-05 MED ORDER — GLUCERNA SHAKE PO LIQD
237.0000 mL | Freq: Every day | ORAL | Status: DC
  Administered 2014-09-05 – 2014-09-06 (×2): 237 mL via ORAL

## 2014-09-05 MED ORDER — LABETALOL HCL 200 MG PO TABS
200.0000 mg | ORAL_TABLET | Freq: Once | ORAL | Status: AC
  Administered 2014-09-05: 200 mg via ORAL
  Filled 2014-09-05: qty 1

## 2014-09-05 MED ORDER — ONDANSETRON HCL 4 MG/2ML IJ SOLN: 4.0000 mg | Freq: Four times a day (QID) | INTRAMUSCULAR | Status: DC | PRN

## 2014-09-05 MED ORDER — CLONIDINE HCL 0.3 MG/24HR TD PTWK
0.3000 mg | MEDICATED_PATCH | TRANSDERMAL | Status: DC
  Administered 2014-09-05: 0.3 mg via TRANSDERMAL
  Filled 2014-09-05: qty 1

## 2014-09-05 MED ORDER — LABETALOL HCL 200 MG PO TABS
400.0000 mg | ORAL_TABLET | Freq: Two times a day (BID) | ORAL | Status: DC
  Administered 2014-09-05 – 2014-09-08 (×7): 400 mg via ORAL
  Filled 2014-09-05 (×7): qty 2

## 2014-09-05 MED ORDER — PRAVASTATIN SODIUM 20 MG PO TABS
20.0000 mg | ORAL_TABLET | Freq: Every evening | ORAL | Status: DC
  Administered 2014-09-05 – 2014-09-07 (×3): 20 mg via ORAL
  Filled 2014-09-05 (×4): qty 1

## 2014-09-05 MED ORDER — WARFARIN - PHARMACIST DOSING INPATIENT
Freq: Every day | Status: DC
  Administered 2014-09-05: 18:00:00

## 2014-09-05 MED ORDER — WARFARIN SODIUM 3 MG PO TABS
3.0000 mg | ORAL_TABLET | ORAL | Status: DC
  Filled 2014-09-05: qty 1

## 2014-09-05 MED ORDER — INSULIN ASPART 100 UNIT/ML ~~LOC~~ SOLN
0.0000 [IU] | Freq: Three times a day (TID) | SUBCUTANEOUS | Status: DC
  Administered 2014-09-05 – 2014-09-06 (×3): 2 [IU] via SUBCUTANEOUS
  Administered 2014-09-07: 3 [IU] via SUBCUTANEOUS
  Administered 2014-09-08: 2 [IU] via SUBCUTANEOUS

## 2014-09-05 MED ORDER — WARFARIN SODIUM 2 MG PO TABS: 2.0000 mg | ORAL_TABLET | ORAL | Status: DC

## 2014-09-05 MED ORDER — CYANOCOBALAMIN 1000 MCG/ML IJ SOLN
1000.0000 ug | INTRAMUSCULAR | Status: DC
  Administered 2014-09-05: 1000 ug via SUBCUTANEOUS
  Filled 2014-09-05: qty 1

## 2014-09-05 NOTE — Consult Note (Signed)
Reason for Consult:Right colon mass Referring Physician: Dr. Pervis Hocking is an 81 y.o. female.  HPI: This is a 81 yo female with an extensive PMH including chronic atrial fibrillation with anticoagulation, DM2, chronic renal disease who presented last night with left flank pain.  She has also had some urinary retention.  She had a CT scan that showed a left renal mass concerning for renal cell carcinoma.  She also has an incidental finding of a large right colon mass, concerning for primary malignancy.  Upon questioning with the patient and her daughter, she seems to be relatively asymptomatic regarding the colon.  She has had a good appetite and has daily bowel movements with the assistance of some PRN Miralax.  She apparently had heme negative stool on a FOBT last year.  She does take chronic iron supplements.  Past Medical History  Diagnosis Date  . POSTHERPETIC NEURALGIA 11/02/2009  . B12 DEFICIENCY 05/23/2007  . HYPERLIPIDEMIA 05/23/2007  . HYPERKALEMIA 05/30/2010  . ANEMIA-IRON DEFICIENCY 01/26/2007  . BLEPHARITIS, BILATERAL 02/23/2008  . Other specified forms of hearing loss 05/30/2010  . HYPERTENSION 01/26/2007  . Atrial fibrillation 05/23/2007  . DIASTOLIC HEART FAILURE, CHRONIC 12/14/2009  . POSTURAL HYPOTENSION 02/07/2008  . ALLERGIC RHINITIS 11/02/2009  . GERD 01/26/2007  . CONSTIPATION 01/25/2008  . SKIN LESION 05/30/2010  . BACK PAIN 08/02/2007  . OSTEOPOROSIS 05/23/2007  . SYNCOPE 02/07/2008  . FATIGUE 09/06/2007  . Dysuria 09/05/2008  . Abdominal pain, epigastric 09/06/2007  . EPIGASTRIC TENDERNESS 10/12/2007  . PULMONARY EMBOLISM, HX OF 05/23/2007  . SHINGLES, HX OF 05/23/2007  . 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 Left foot pain   . Diabetic neuropathy   . RENAL INSUFFICIENCY 07/02/2009  . RENAL CYST, LEFT 09/06/2007  . Kidney stones     "never had OR"  . Family history of anesthesia complication     "daughter has PONV; another daughter a little anesthesia lasts way too long"  .  CHF (congestive heart failure) 2011  . Myocardial infarction 2011    "mild"  . DVT (deep venous thrombosis) ~ 1964    "think it was in the left"  . Aspiration pneumonia ~ 2006    "due to aspiration"  . DIABETES MELLITUS, TYPE II 09/06/2007  . DEPRESSIVE DISORDER 01/25/2008    "@ times; never treated for it"  . Melanoma of nose   . Compression fracture of lumbar vertebra     hx  . Compression fracture of thoracic vertebra 02/07/2014    "fell this am" (02/07/2014  . Arthritis     "hands" (03/15/2014)    Past Surgical History  Procedure Laterality Date  . Appendectomy  1943  . Abdominal hysterectomy  1964  . Cardiac electrophysiology mapping and ablation  1999  . Cataract extraction w/ intraocular lens  implant, bilateral  2003-2005  . Carpal tunnel release Right 2004  . Mohs surgery  2011    "removed off her nose"  . Cardiac catheterization  10/2009  . Esophageal dilation  X 2    Family History  Problem Relation Age of Onset  . Breast cancer Mother   . Colon cancer Father   . Stroke Father     died with stroke postop  . Diabetes Sister     2 sisters  . Hypertension Other     Social History:  reports that she has never smoked. She has never used smokeless tobacco. She reports that she does not drink alcohol or use illicit drugs.  Allergies:  Allergies  Allergen Reactions  . Amiodarone Other (See Comments)    REACTION: f? fluid retention  . Deltasone [Prednisone] Other (See Comments)    unknown  . Diltiazem Hcl Other (See Comments)    unknown  . Levofloxacin Nausea And Vomiting    REACTION: nausea  . Pregabalin Other (See Comments)    REACTION: hallucinations  . Spironolactone Other (See Comments)    REACTION: elevated K at lowest dose  . Tequin [Gatifloxacin] Other (See Comments)    Hypoglycemia  . Diazepam Anxiety and Other (See Comments)    REACTION: agitation    Medications:  Prior to Admission medications   Medication Sig Start Date End Date Taking?  Authorizing Provider  acetaminophen (TYLENOL) 325 MG tablet Take 2 tablets (650 mg total) by mouth 3 (three) times daily. 02/09/14  Yes Christiane Ha, MD  cefdinir (OMNICEF) 300 MG capsule Take 1 capsule (300 mg total) by mouth 2 (two) times daily. 08/29/14  Yes Penny Pia, MD  cloNIDine (CATAPRES - DOSED IN MG/24 HR) 0.3 mg/24hr patch Place 0.3 mg onto the skin once a week.   Yes Historical Provider, MD  CRANBERRY PO Take 500 mg by mouth daily.    Yes Historical Provider, MD  donepezil (ARICEPT) 10 MG tablet Take 1 tablet (10 mg total) by mouth at bedtime. 08/08/14  Yes Corwin Levins, MD  feeding supplement, GLUCERNA SHAKE, (GLUCERNA SHAKE) LIQD Take 237 mLs by mouth 2 (two) times daily between meals. Patient taking differently: Take 237 mLs by mouth daily.  08/29/14  Yes Penny Pia, MD  ferrous sulfate 325 (65 FE) MG tablet Take 1 tablet (325 mg total) by mouth 2 (two) times daily with a meal. 02/15/14  Yes Evlyn Kanner Love, PA-C  fluticasone (FLONASE) 50 MCG/ACT nasal spray Place 2 sprays into the nose daily. 03/22/13  Yes Corwin Levins, MD  Hypromellose (ARTIFICIAL TEARS OP) Place 1 drop into both eyes daily.   Yes Historical Provider, MD  insulin glargine (LANTUS) 100 UNIT/ML injection Inject 23 Units into the skin daily.    Yes Historical Provider, MD  labetalol (NORMODYNE) 200 MG tablet Take 400 mg by mouth 2 (two) times daily.   Yes Historical Provider, MD  pantoprazole (PROTONIX) 40 MG tablet Take 1 tablet (40 mg total) by mouth daily. 07/20/13  Yes Corwin Levins, MD  polyethylene glycol [facility] Ethelene Hal) packet Take 17 g by mouth 2 (two) times daily. 02/15/14  Yes Evlyn Kanner Love, PA-C  pravastatin (PRAVACHOL) 40 MG tablet Take 20 mg by mouth every evening.  07/20/13  Yes Corwin Levins, MD  warfarin (COUMADIN) 2 MG tablet Follow instructions per coumadin clinic Patient taking differently: Take 2-3 mg by mouth daily. Take 3 mg daily EXCEPT 2mg  on MWF 07/13/14  Yes 13/12/15, MD   cyanocobalamin (,VITAMIN B-12,) 1000 MCG/ML injection INJECT 1 ML (1000 MCG) INTO THE MUSCLE EVERY 30 DAYS. 04/25/14   04/27/14, MD     Results for orders placed or performed during the hospital encounter of 09/04/14 (from the past 48 hour(s))  Urinalysis, Routine w reflex microscopic     Status: Abnormal   Collection Time: 09/04/14  6:17 PM  Result Value Ref Range   Color, Urine YELLOW YELLOW   APPearance HAZY (A) CLEAR   Specific Gravity, Urine 1.017 1.005 - 1.030   pH 7.0 5.0 - 8.0   Glucose, UA 250 (A) NEGATIVE mg/dL   Hgb urine dipstick NEGATIVE NEGATIVE  Bilirubin Urine NEGATIVE NEGATIVE   Ketones, ur NEGATIVE NEGATIVE mg/dL   Protein, ur 30 (A) NEGATIVE mg/dL   Urobilinogen, UA 0.2 0.0 - 1.0 mg/dL   Nitrite NEGATIVE NEGATIVE   Leukocytes, UA SMALL (A) NEGATIVE  Urine microscopic-add on     Status: Abnormal   Collection Time: 09/04/14  6:17 PM  Result Value Ref Range   Squamous Epithelial / LPF FEW (A) RARE   WBC, UA 21-50 <3 WBC/hpf   Bacteria, UA RARE RARE  Comprehensive metabolic panel     Status: Abnormal   Collection Time: 09/04/14  6:32 PM  Result Value Ref Range   Sodium 137 135 - 145 mmol/L    Comment: Please note change in reference range.   Potassium 4.3 3.5 - 5.1 mmol/L    Comment: Please note change in reference range. DELTA CHECK NOTED    Chloride 104 96 - 112 mEq/L   CO2 26 19 - 32 mmol/L   Glucose, Bld 179 (H) 70 - 99 mg/dL   BUN 29 (H) 6 - 23 mg/dL   Creatinine, Ser 1.11 (H) 0.50 - 1.10 mg/dL   Calcium 9.5 8.4 - 10.5 mg/dL   Total Protein 5.7 (L) 6.0 - 8.3 g/dL   Albumin 3.3 (L) 3.5 - 5.2 g/dL   AST 22 0 - 37 U/L   ALT 19 0 - 35 U/L   Alkaline Phosphatase 62 39 - 117 U/L   Total Bilirubin 0.3 0.3 - 1.2 mg/dL   GFR calc non Af Amer 42 (L) >90 mL/min   GFR calc Af Amer 49 (L) >90 mL/min    Comment: (NOTE) The eGFR has been calculated using the CKD EPI equation. This calculation has not been validated in all clinical situations. eGFR's  persistently <90 mL/min signify possible Chronic Kidney Disease.    Anion gap 7 5 - 15  CBC with Differential     Status: None   Collection Time: 09/04/14  6:32 PM  Result Value Ref Range   WBC 4.5 4.0 - 10.5 K/uL   RBC 4.38 3.87 - 5.11 MIL/uL   Hemoglobin 12.5 12.0 - 15.0 g/dL   HCT 39.1 36.0 - 46.0 %   MCV 89.3 78.0 - 100.0 fL   MCH 28.5 26.0 - 34.0 pg   MCHC 32.0 30.0 - 36.0 g/dL   RDW 15.4 11.5 - 15.5 %   Platelets 163 150 - 400 K/uL   Neutrophils Relative % 73 43 - 77 %   Neutro Abs 3.3 1.7 - 7.7 K/uL   Lymphocytes Relative 14 12 - 46 %   Lymphs Abs 0.7 0.7 - 4.0 K/uL   Monocytes Relative 11 3 - 12 %   Monocytes Absolute 0.5 0.1 - 1.0 K/uL   Eosinophils Relative 2 0 - 5 %   Eosinophils Absolute 0.1 0.0 - 0.7 K/uL   Basophils Relative 0 0 - 1 %   Basophils Absolute 0.0 0.0 - 0.1 K/uL  Troponin I     Status: None   Collection Time: 09/04/14  6:32 PM  Result Value Ref Range   Troponin I <0.03 <0.031 ng/mL    Comment:        NO INDICATION OF MYOCARDIAL INJURY. Please note change in reference range.   Protime-INR     Status: Abnormal   Collection Time: 09/04/14  9:00 PM  Result Value Ref Range   Prothrombin Time 25.0 (H) 11.6 - 15.2 seconds   INR 2.25 (H) 0.00 - 1.49  Comprehensive metabolic panel  Status: Abnormal   Collection Time: 09/05/14  4:06 AM  Result Value Ref Range   Sodium 139 135 - 145 mmol/L    Comment: Please note change in reference range.   Potassium 4.0 3.5 - 5.1 mmol/L    Comment: Please note change in reference range.   Chloride 102 96 - 112 mEq/L   CO2 30 19 - 32 mmol/L   Glucose, Bld 89 70 - 99 mg/dL   BUN 21 6 - 23 mg/dL   Creatinine, Ser 1.02 0.50 - 1.10 mg/dL   Calcium 9.6 8.4 - 10.5 mg/dL   Total Protein 5.4 (L) 6.0 - 8.3 g/dL   Albumin 3.2 (L) 3.5 - 5.2 g/dL   AST 23 0 - 37 U/L   ALT 22 0 - 35 U/L   Alkaline Phosphatase 59 39 - 117 U/L   Total Bilirubin 0.4 0.3 - 1.2 mg/dL   GFR calc non Af Amer 47 (L) >90 mL/min   GFR calc Af  Amer 54 (L) >90 mL/min    Comment: (NOTE) The eGFR has been calculated using the CKD EPI equation. This calculation has not been validated in all clinical situations. eGFR's persistently <90 mL/min signify possible Chronic Kidney Disease.    Anion gap 7 5 - 15  CBC with Differential     Status: None   Collection Time: 09/05/14  4:06 AM  Result Value Ref Range   WBC 4.9 4.0 - 10.5 K/uL   RBC 4.31 3.87 - 5.11 MIL/uL   Hemoglobin 12.5 12.0 - 15.0 g/dL   HCT 38.4 36.0 - 46.0 %   MCV 89.1 78.0 - 100.0 fL   MCH 29.0 26.0 - 34.0 pg   MCHC 32.6 30.0 - 36.0 g/dL   RDW 15.5 11.5 - 15.5 %   Platelets 172 150 - 400 K/uL   Neutrophils Relative % 72 43 - 77 %   Neutro Abs 3.5 1.7 - 7.7 K/uL   Lymphocytes Relative 17 12 - 46 %   Lymphs Abs 0.8 0.7 - 4.0 K/uL   Monocytes Relative 9 3 - 12 %   Monocytes Absolute 0.5 0.1 - 1.0 K/uL   Eosinophils Relative 2 0 - 5 %   Eosinophils Absolute 0.1 0.0 - 0.7 K/uL   Basophils Relative 0 0 - 1 %   Basophils Absolute 0.0 0.0 - 0.1 K/uL  Glucose, capillary     Status: None   Collection Time: 09/05/14  6:47 AM  Result Value Ref Range   Glucose-Capillary 73 70 - 99 mg/dL   Comment 1 Documented in Chart    Comment 2 Notify RN     Dg Chest 2 View  09/04/2014   CLINICAL DATA:  Chest rales.  EXAM: CHEST  2 VIEW  COMPARISON:  Radiograph 08/27/2014  FINDINGS: Patient is rotated to the right. Diminished hyperinflation from prior. Cardiomediastinal contours are normal and unchanged. Atherosclerosis noted of the aortic arch. Pulmonary vasculature is normal. Minimal atelectasis or scarring in the left midlung zone. Decreased right basilar atelectasis. No confluent airspace disease. No pleural effusion or pneumothorax. The bones appear under mineralized. Compression deformities again noted in the thoracic spine  IMPRESSION: No acute pulmonary process.   Electronically Signed   By: Jeb Levering M.D.   On: 09/04/2014 19:02   Ct Abdomen Pelvis W Contrast  09/04/2014    CLINICAL DATA:  Lower abdomen and left flank pain. Recently treated for a urinary tract infection. Previous appendectomy and hysterectomy.  EXAM: CT ABDOMEN AND PELVIS  WITH CONTRAST  TECHNIQUE: Multidetector CT imaging of the abdomen and pelvis was performed using the standard protocol following bolus administration of intravenous contrast.  CONTRAST:  146mL OMNIPAQUE IOHEXOL 300 MG/ML  SOLN  COMPARISON:  Pelvis CT dated 06/23/2008 and abdomen CT dated 01/28/2008.  FINDINGS: The previously demonstrated 2.9 cm fat containing left adrenal mass currently measures 2.8 x 1.6 cm on image 19. There is a large mass in the right colon extending superior and inferior to the ileocecal valve. On coronal image number 58, this measures 5.8 x 4.6 cm. On axial image number 41, this measures 4.2 cm in AP diameter.  Increased prominence of bilateral extrarenal pelves with interval distention of the urinary bladder. Mild inferior bladder wall thickening as well as partial vaginal prolapse of the bladder. No urinary tract calculi are seen.  Mild diffuse low density of the liver relative to the spleen. 1.2 x 0.7 cm polyp or non calcified gallstone in the gallbladder fundus on image number 29. Unremarkable spleen, pancreas and right adrenal gland. The previously demonstrated 1.2 x 1.0 cm mid left renal mass suspicious for a primary renal neoplasm is slightly larger, currently measuring 1.6 x 1.3 cm on image number 27. This measures 72 Hounsfield units in density.  Old, healed bilateral pubic fractures. Diffuse osteopenia. Multiple old lumbar and lower thoracic vertebral compression fractures. Some of these are associated with bony retropulsion. This is most pronounced at the upper L2 level, with moderate canal stenosis. No acute fracture lines are seen. Atheromatous arterial calcifications. Clear lung bases.  IMPRESSION: 1. 5.8 x 4.6 x 4.2 cm polypoid right colon mass extending superior and inferior to the ileocecal valve. This is  concerning for a primary colon neoplasm. 2. Mild increase in size of the previously demonstrated left renal mass suspicious for a renal cell carcinoma on a previous MR. 3. Prolapse of the urinary bladder into the upper vagina. 4. Mild inferior bladder wall thickening. This could be due to plaque-like tumor or reactive changes due to chronic prolapse into the vagina. 5. Distention of the urinary bladder with associated mild bilateral hydronephrosis. 6. 1.2 cm arm polyp or noncalcified gallstone in the gallbladder without evidence of cholecystitis. 7. Stable left adrenal myelolipoma. 8. Diffuse osteopenia with old vertebral compression fractures and old, healed bilateral pubic fractures. 9. Mild diffuse hepatic steatosis.   Electronically Signed   By: Enrique Sack M.D.   On: 09/04/2014 21:36    ROS Blood pressure 194/86, pulse 83, temperature 98.2 F (36.8 C), temperature source Oral, resp. rate 20, height 5' 6.5" (1.689 m), weight 114 lb 1.6 oz (51.755 kg), SpO2 98 %. Physical Exam Frail appearing elderly female - in NAD Hard of hearing Abd - soft, non-tender; + BS No palpable masses  Assessment/Plan: Large right colon mass - concerning for maliagnancy. Asymptomatic; non-obstructing at this time. Poor operative candidate due to advanced age, frailty, and multiple medical/ cardiac issues The patient and her daughter seem to be opposed to any aggressive intervention.  Recs:  GI consult to consider bowel prep and colonoscopy for biopsy.  If this is a malignancy, perhaps non-operative management with a milder form of chemotherapy such as Xeloda might be an option to control the growth of the tumor.  If she is nearly obstructed, then we will have to consider surgical resection.    Would proceed with cardiology evaluation for clearance for both colonoscopy and possibly surgery.  Urology evaluation of left renal mass and hydronephrosis.  CEA level.    Lindsay Sheppard  K. 09/05/2014, 8:38 AM

## 2014-09-05 NOTE — Telephone Encounter (Signed)
HHRN informed of verbal OK.

## 2014-09-05 NOTE — Consult Note (Addendum)
[facility] Consult: 10:49 AM 09/05/2014  LOS: 1 day    Referring Provider: Dr Ree Kida   Primary Care Physician:  [name], MD Primary Gastroenterologist:  Erskine Emery, MD     Reason for Consultation:  Right colon mass.    HPI: Lindsay Sheppard is a 81 y.o. female.  On Coumadin for A fib, DM2, CKD,   Admited with left flank pain.  CT scan with left renal mass and right colon mass.  Has daily BMs, occasional miralax for conspitation.  No anorexia, no n/v. Stool FOBT in 2015 was apparently negative.   General Surgery has seen pt and feel pt is not a surgical candidate, family in agreement with this.  Dr. Nanda Quinton did raise the issue of possible "milder form of chemotherapy such as Xeloda might be an option to control the growth of the tumor".   2002 colonoscopy but no reports in Epic. Also in records is esophageal dilatation x 2. All of these were remote.     Past Medical History  Diagnosis Date  . POSTHERPETIC NEURALGIA 11/02/2009  . B12 DEFICIENCY 05/23/2007  . HYPERLIPIDEMIA 05/23/2007  . HYPERKALEMIA 05/30/2010  . ANEMIA-IRON DEFICIENCY 01/26/2007  . BLEPHARITIS, BILATERAL 02/23/2008  . Other specified forms of hearing loss 05/30/2010  . HYPERTENSION 01/26/2007  . Atrial fibrillation 05/23/2007  . DIASTOLIC HEART FAILURE, CHRONIC 12/14/2009  . POSTURAL HYPOTENSION 02/07/2008  . ALLERGIC RHINITIS 11/02/2009  . GERD 01/26/2007  . CONSTIPATION 01/25/2008  . SKIN LESION 05/30/2010  . BACK PAIN 08/02/2007  . OSTEOPOROSIS 05/23/2007  . SYNCOPE 02/07/2008  . FATIGUE 09/06/2007  . Dysuria 09/05/2008  . Abdominal pain, epigastric 09/06/2007  . EPIGASTRIC TENDERNESS 10/12/2007  . PULMONARY EMBOLISM, HX OF 05/23/2007  . SHINGLES, HX OF 05/23/2007  . 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 Left foot pain   . Diabetic neuropathy     . RENAL INSUFFICIENCY 07/02/2009  . RENAL CYST, LEFT 09/06/2007  . Kidney stones     "never had OR"  . Family history of anesthesia complication     "daughter has PONV; another daughter a little anesthesia lasts way too long"  . CHF (congestive heart failure) 2011  . Myocardial infarction 2011    "mild"  . DVT (deep venous thrombosis) ~ 1964    "think it was in the left"  . Aspiration pneumonia ~ 2006    "due to aspiration"  . DIABETES MELLITUS, TYPE II 09/06/2007  . DEPRESSIVE DISORDER 01/25/2008    "@ times; never treated for it"  . Melanoma of nose   . Compression fracture of lumbar vertebra     hx  . Compression fracture of thoracic vertebra 02/07/2014    "fell this am" (02/07/2014  . Arthritis     "hands" (03/15/2014)    Past Surgical History  Procedure Laterality Date  . Appendectomy  1943  . Abdominal hysterectomy  1964  . Cardiac electrophysiology mapping and ablation  1999  . Cataract extraction w/ intraocular lens  implant, bilateral  2003-2005  . Carpal tunnel release Right 2004  . Mohs  surgery  2011    "removed off her nose"  . Cardiac catheterization  10/2009  . Esophageal dilation  X 2    Prior to Admission medications   Medication Sig Start Date End Date Taking? Authorizing Provider  acetaminophen (TYLENOL) 325 MG tablet Take 2 tablets (650 mg total) by mouth 3 (three) times daily. 02/09/14  Yes Delfina Redwood, MD  cefdinir (OMNICEF) 300 MG capsule Take 1 capsule (300 mg total) by mouth 2 (two) times daily. 08/29/14  Yes Velvet Bathe, MD  cloNIDine (CATAPRES - DOSED IN MG/24 HR) 0.3 mg/24hr patch Place 0.3 mg onto the skin once a week.   Yes Historical Provider, MD  CRANBERRY PO Take 500 mg by mouth daily.    Yes Historical Provider, MD  donepezil (ARICEPT) 10 MG tablet Take 1 tablet (10 mg total) by mouth at bedtime. 08/08/14  Yes Biagio Borg, MD  feeding supplement, GLUCERNA SHAKE, (GLUCERNA SHAKE) LIQD Take 237 mLs by mouth 2 (two) times daily between  meals. Patient taking differently: Take 237 mLs by mouth daily.  08/29/14  Yes Velvet Bathe, MD  ferrous sulfate 325 (65 FE) MG tablet Take 1 tablet (325 mg total) by mouth 2 (two) times daily with a meal. 02/15/14  Yes Ivan Anchors Love, PA-C  fluticasone (FLONASE) 50 MCG/ACT nasal spray Place 2 sprays into the nose daily. 03/22/13  Yes Biagio Borg, MD  Hypromellose (ARTIFICIAL TEARS OP) Place 1 drop into both eyes daily.   Yes Historical Provider, MD  insulin glargine (LANTUS) 100 UNIT/ML injection Inject 23 Units into the skin daily.    Yes Historical Provider, MD  labetalol (NORMODYNE) 200 MG tablet Take 400 mg by mouth 2 (two) times daily.   Yes Historical Provider, MD  pantoprazole (PROTONIX) 40 MG tablet Take 1 tablet (40 mg total) by mouth daily. 07/20/13  Yes Biagio Borg, MD  polyethylene glycol [facility] / Floria Raveling) packet Take 17 g by mouth 2 (two) times daily. 02/15/14  Yes Ivan Anchors Love, PA-C  pravastatin (PRAVACHOL) 40 MG tablet Take 20 mg by mouth every evening.  07/20/13  Yes Biagio Borg, MD  warfarin (COUMADIN) 2 MG tablet Follow instructions per coumadin clinic Patient taking differently: Take 2-3 mg by mouth daily. Take 3 mg daily EXCEPT 2mg  on MWF 07/13/14  Yes Biagio Borg, MD  cyanocobalamin (,VITAMIN B-12,) 1000 MCG/ML injection INJECT 1 ML (1000 MCG) INTO THE MUSCLE EVERY 30 DAYS. 04/25/14   Biagio Borg, MD    Scheduled Meds: . sodium chloride   Intravenous STAT  . cloNIDine  0.3 mg Transdermal Weekly  . cyanocobalamin  1,000 mcg Subcutaneous Q30 days  . donepezil  10 mg Oral QHS  . feeding supplement (GLUCERNA SHAKE)  237 mL Oral Daily  . ferrous sulfate  325 mg Oral BID WC  . fluticasone  2 spray Each Nare Daily  . insulin aspart  0-9 Units Subcutaneous TID WC  . insulin glargine  15 Units Subcutaneous Daily  . labetalol  400 mg Oral BID  . pantoprazole  40 mg Oral Daily  . piperacillin-tazobactam (ZOSYN)  IV  3.375 g Intravenous Q8H  . pravastatin  20 mg Oral QPM  .  Warfarin - Pharmacist Dosing Inpatient   Does not apply q1800   Infusions: . sodium chloride 75 mL/hr at 09/05/14 0300   PRN Meds: acetaminophen **OR** acetaminophen, labetalol, morphine injection, ondansetron **OR** ondansetron (ZOFRAN) IV   Allergies as of 09/04/2014 - Review Complete 09/04/2014  Allergen Reaction Noted  . Amiodarone Other (See Comments) 08/02/2007  . Deltasone [prednisone] Other (See Comments) 05/12/2013  . Diltiazem hcl Other (See Comments) 05/23/2007  . Levofloxacin Nausea And Vomiting 05/23/2007  . Pregabalin Other (See Comments) 08/02/2007  . Spironolactone Other (See Comments) 04/24/2008  . Tequin [gatifloxacin] Other (See Comments) 05/12/2013  . Diazepam Anxiety and Other (See Comments) 05/23/2007    Family History  Problem Relation Age of Onset  . Breast cancer Mother   . Colon cancer Father   . Stroke Father     died with stroke postop  . Diabetes Sister     2 sisters  . Hypertension Other     History   Social History  . Marital Status: Widowed    Spouse Name: N/A    Number of Children: 30  . Years of Education: N/A   Occupational History  . Retired    Social History Main Topics  . Smoking status: Never Smoker   . Smokeless tobacco: Never Used  . Alcohol Use: No  . Drug Use: No  . Sexual Activity: No   Other Topics Concern  . Not on file   Social History Narrative    REVIEW OF SYSTEMS: Constitutional:  Stable weight.  Walks with a walker or with human assistance. ENT:  No nose bleeds Pulm:  No cough, no DOE, no PN DD. CV:  No palpitations, no LE edema. No anginal symptoms GU:  No hematuria, no frequency.  Some incontinence. GI:  PerHPI Heme:   No unusual bruising or bleeding. Tendency towards purpura on the extremities.    Transfusions:  None  Neuro:  No headaches, no peripheral tingling or numbness Derm:  No itching, no rash or sores.  Endocrine:  No sweats or chills.  No polyuria or dysuria Immunization:  Reviewed.  Up-to-date on Pneumovax and flu.  Travel:  None beyond local counties in last few months.    PHYSICAL EXAM: Vital signs in last 24 hours: Filed Vitals:   09/05/14 0928  BP: 215/91  Pulse: 89  Temp: 97.7 F (36.5 C)  Resp: 21   Wt Readings from Last 3 Encounters:  09/05/14 114 lb 1.6 oz (51.755 kg)  08/28/14 116 lb (52.617 kg)  08/08/14 115 lb (52.164 kg)    General: Pleasant, frail, aged WF Head:   No facial asymmetry, swelling or signs of trauma.   Eyes: No icterus, no conjunctival pallor. EOMI. Ears:  Slightly hard of hearing Nose: No congestion or discharge. Mouth: Clear, moist. Full set of dentures in place. Neck: No mass, no bruits, no TMG. Lungs:  Clear bilaterally. No cough, no dyspnea.  Heart: RRR. No MRG. S1/S2 audible.  Abdomen:  Soft, NT, ND. No appreciable HSM or masses. No bruits..   Rectal:  Deferred.    Musc/Skeltl: Spine kyphotic. No joint swelling or erythema.  Extremities:   No CCE.   Neurologic:  Appropriate. Alert. Oriented x place. Not confused. Skin:   No rash, no telangiectasia, no sores.  Tattoos: None Nodes:  No cervical adenopathy.    Psych:  Pleasant, relaxed, cooperative.   Intake/Output from previous day: 01/04 0701 - 01/05 0700 In: 50 [I.V.:50] Out: 1990 [Urine:1990] Intake/Output this shift: Total I/O In: 400 [P.O.:400] Out: 250 [Urine:250]  LAB RESULTS:  Recent Labs  09/04/14 1832 09/05/14 0406  WBC 4.5 4.9  HGB 12.5 12.5  HCT 39.1 38.4  PLT 163 172   BMET Lab Results  Component Value Date   NA 139 09/05/2014  NA 137 09/04/2014   NA 138 08/28/2014   K 4.0 09/05/2014   K 4.3 09/04/2014   K 3.3* 08/28/2014   CL 102 09/05/2014   CL 104 09/04/2014   CL 103 08/28/2014   CO2 30 09/05/2014   CO2 26 09/04/2014   CO2 27 08/28/2014   GLUCOSE 89 09/05/2014   GLUCOSE 179* 09/04/2014   GLUCOSE 91 08/28/2014   BUN 21 09/05/2014   BUN 29* 09/04/2014   BUN 16 08/28/2014   CREATININE 1.02 09/05/2014   CREATININE 1.11*  09/04/2014   CREATININE 0.90 08/28/2014   CALCIUM 9.6 09/05/2014   CALCIUM 9.5 09/04/2014   CALCIUM 9.5 08/28/2014   LFT  Recent Labs  09/04/14 1832 09/05/14 0406  PROT 5.7* 5.4*  ALBUMIN 3.3* 3.2*  AST 22 23  ALT 19 22  ALKPHOS 62 59  BILITOT 0.3 0.4   PT/INR Lab Results  Component Value Date   INR 2.25* 09/04/2014   INR 3.6 08/30/2014   INR 2.30* 08/29/2014   PROTIME 19.0 02/16/2009   PROTIME 14.7 02/09/2009   PROTIME 16.6 01/26/2009   Hepatitis Panel No results for input(s): HEPBSAG, HCVAB, HEPAIGM, HEPBIGM in the last 72 hours. C-Diff No components found for: CDIFF Lipase     Component Value Date/Time   LIPASE 29 10/23/2009 0301    Drugs of Abuse  No results found for: LABOPIA, COCAINSCRNUR, LABBENZ, AMPHETMU, THCU, LABBARB   RADIOLOGY STUDIES: Dg Chest 2 View  09/04/2014   CLINICAL DATA:  Chest rales.  EXAM: CHEST  2 VIEW  COMPARISON:  Radiograph 08/27/2014  FINDINGS: Patient is rotated to the right. Diminished hyperinflation from prior. Cardiomediastinal contours are normal and unchanged. Atherosclerosis noted of the aortic arch. Pulmonary vasculature is normal. Minimal atelectasis or scarring in the left midlung zone. Decreased right basilar atelectasis. No confluent airspace disease. No pleural effusion or pneumothorax. The bones appear under mineralized. Compression deformities again noted in the thoracic spine  IMPRESSION: No acute pulmonary process.   Electronically Signed   By: Jeb Levering M.D.   On: 09/04/2014 19:02   Ct Abdomen Pelvis W Contrast  09/04/2014   CLINICAL DATA:  Lower abdomen and left flank pain. Recently treated for a urinary tract infection. Previous appendectomy and hysterectomy.  EXAM: CT ABDOMEN AND PELVIS WITH CONTRAST  TECHNIQUE: Multidetector CT imaging of the abdomen and pelvis was performed using the standard protocol following bolus administration of intravenous contrast.  CONTRAST:  132mL OMNIPAQUE IOHEXOL 300 MG/ML  SOLN   COMPARISON:  Pelvis CT dated 06/23/2008 and abdomen CT dated 01/28/2008.  FINDINGS: The previously demonstrated 2.9 cm fat containing left adrenal mass currently measures 2.8 x 1.6 cm on image 19. There is a large mass in the right colon extending superior and inferior to the ileocecal valve. On coronal image number 58, this measures 5.8 x 4.6 cm. On axial image number 41, this measures 4.2 cm in AP diameter.  Increased prominence of bilateral extrarenal pelves with interval distention of the urinary bladder. Mild inferior bladder wall thickening as well as partial vaginal prolapse of the bladder. No urinary tract calculi are seen.  Mild diffuse low density of the liver relative to the spleen. 1.2 x 0.7 cm polyp or non calcified gallstone in the gallbladder fundus on image number 29. Unremarkable spleen, pancreas and right adrenal gland. The previously demonstrated 1.2 x 1.0 cm mid left renal mass suspicious for a primary renal neoplasm is slightly larger, currently measuring 1.6 x 1.3 cm on image number 27.  This measures 72 Hounsfield units in density.  Old, healed bilateral pubic fractures. Diffuse osteopenia. Multiple old lumbar and lower thoracic vertebral compression fractures. Some of these are associated with bony retropulsion. This is most pronounced at the upper L2 level, with moderate canal stenosis. No acute fracture lines are seen. Atheromatous arterial calcifications. Clear lung bases.  IMPRESSION: 1. 5.8 x 4.6 x 4.2 cm polypoid right colon mass extending superior and inferior to the ileocecal valve. This is concerning for a primary colon neoplasm. 2. Mild increase in size of the previously demonstrated left renal mass suspicious for a renal cell carcinoma on a previous MR. 3. Prolapse of the urinary bladder into the upper vagina. 4. Mild inferior bladder wall thickening. This could be due to plaque-like tumor or reactive changes due to chronic prolapse into the vagina. 5. Distention of the urinary  bladder with associated mild bilateral hydronephrosis. 6. 1.2 cm arm polyp or noncalcified gallstone in the gallbladder without evidence of cholecystitis. 7. Stable left adrenal myelolipoma. 8. Diffuse osteopenia with old vertebral compression fractures and old, healed bilateral pubic fractures. 9. Mild diffuse hepatic steatosis.   Electronically Signed   By: Enrique Sack M.D.   On: 09/04/2014 21:36   US Renal  09/05/2014   CLINICAL DATA:  Elevated BUN and creatinine in, concern for hydronephrosis, history of renal stones and diabetes  EXAM: RENAL/URINARY TRACT ULTRASOUND COMPLETE  COMPARISON:  09/04/2014 CT scan  FINDINGS: Right Kidney:  Length: 8.6 cm. Normal echogenicity. Cortical thinning. 11 mm upper pole cyst. No hydronephrosis.  Left Kidney:  Length: 8.9 cm. Normal echogenicity with cortical thinning. As seen on CT scan, there is an oval lesion upper pole to midpole left kidney measuring 16 x 12 x 14 mm. It is hypoechoic with evidence of some vascularity in the wall.  Bladder:  Appears normal for degree of bladder distention.  IMPRESSION: Bilateral atrophy.  No hydronephrosis.  Indeterminate lesion upper pole left kidney measuring 16 mm. Solid mass not excluded. This has been seen on prior CT scan and MRI and has been considered suspicious for renal cell carcinoma.   Electronically Signed   By: Skipper Cliche M.D.   On: 09/05/2014 08:36    ENDOSCOPIC STUDIES: Per HPI  IMPRESSION:   *  Right colon mass. No obstructive sxs.    *  Left renal mass.   *  Chronic Coumadin.  Last dose was PTA. INR 2.2. Set to receive this at 1800 today. No Lovenox, no heparin in use.     PLAN:     *    Defer any decisions as to colonoscopy to Dr. Fuller Plan in conjunction with the family's wishes. It may be best not to do anything in this case. Surgery is not an option. I'm not sure that any type of chemotherapy in an asymptomatic 81 year old is advisable.    Azucena Freed  09/05/2014, 10:49 AM Pager:  [phone]      Attending physician's note   I have taken a history, examined the patient and reviewed the chart. I agree with the Advanced Practitioner's note, impression and recommendations. Right colon mass and left renal mass, both very concerning for malignancies. Pt is not bleeding and does not have any obstructive symptoms. General surgery feels that the pateint is a poor operative candidate, the family agrees and I agree. Therefore I do not feel that colonoscopy will provide any long term benefit for her. She is at higher risk for colonoscopy and sedation related complications. The patient and  the family members present are not interested in colonoscopy at this time. If the patient and family change their minds and insist on a tissue diagnosis we can reconsider colonoscopy performed off Coumadin. Outpatient GI follow up with Dr. Erskine Emery if needed. Please call if needed.   Ladene Artist, MD [facility]

## 2014-09-05 NOTE — Progress Notes (Signed)
[facility]  Patient Status: Active (receiving services up to time of hospitalization)  [facility] is providing the following services: RN and PT  If patient discharges after hours, please call [phone].   Lindsay Sheppard 09/05/2014, 4:09 PM

## 2014-09-05 NOTE — Telephone Encounter (Signed)
HHRN would like a verbal to do a UA.  Patient has been urinating frequently and family thinks has infection again.

## 2014-09-05 NOTE — Consult Note (Signed)
Reason for Consult: Small Left Renal Mass  Referring Physician: M. Mikhail MD  Lindsay Sheppard is an 81 y.o. female.   HPI:   1 - Small Left Renal Mass - 1.6cm enhancing left renal mass by CT 09/2014 during admission for abdominal pain. This lesion has been present since at least 2008 with slow interval growth (1.2cm 2008). No gross hematuria. She previously has followed with Dr. Gaynelle Arabian.   PMH sig for AFib with clots / chronic coumadin, hyst, appy, weight loss.   Today " Lindsay Sheppard" is seen in consultation for above, she is referred by Dr. Ree Kida.   Past Medical History  Diagnosis Date  . POSTHERPETIC NEURALGIA 11/02/2009  . B12 DEFICIENCY 05/23/2007  . HYPERLIPIDEMIA 05/23/2007  . HYPERKALEMIA 05/30/2010  . ANEMIA-IRON DEFICIENCY 01/26/2007  . BLEPHARITIS, BILATERAL 02/23/2008  . Other specified forms of hearing loss 05/30/2010  . HYPERTENSION 01/26/2007  . Atrial fibrillation 05/23/2007  . DIASTOLIC HEART FAILURE, CHRONIC 12/14/2009  . POSTURAL HYPOTENSION 02/07/2008  . ALLERGIC RHINITIS 11/02/2009  . GERD 01/26/2007  . CONSTIPATION 01/25/2008  . SKIN LESION 05/30/2010  . BACK PAIN 08/02/2007  . OSTEOPOROSIS 05/23/2007  . SYNCOPE 02/07/2008  . FATIGUE 09/06/2007  . Dysuria 09/05/2008  . Abdominal pain, epigastric 09/06/2007  . EPIGASTRIC TENDERNESS 10/12/2007  . PULMONARY EMBOLISM, HX OF 05/23/2007  . SHINGLES, HX OF 05/23/2007  . 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 Left foot pain   . Diabetic neuropathy   . RENAL INSUFFICIENCY 07/02/2009  . RENAL CYST, LEFT 09/06/2007  . Kidney stones     "never had OR"  . Family history of anesthesia complication     "daughter has PONV; another daughter a little anesthesia lasts way too long"  . CHF (congestive heart failure) 2011  . Myocardial infarction 2011    "mild"  . DVT (deep venous thrombosis) ~ 1964    "think it was in the left"  . Aspiration pneumonia ~ 2006    "due to aspiration"  . DIABETES MELLITUS, TYPE II 09/06/2007  . DEPRESSIVE DISORDER  01/25/2008    "@ times; never treated for it"  . Melanoma of nose   . Compression fracture of lumbar vertebra     hx  . Compression fracture of thoracic vertebra 02/07/2014    "fell this am" (02/07/2014  . Arthritis     "hands" (03/15/2014)    Past Surgical History  Procedure Laterality Date  . Appendectomy  1943  . Abdominal hysterectomy  1964  . Cardiac electrophysiology mapping and ablation  1999  . Cataract extraction w/ intraocular lens  implant, bilateral  2003-2005  . Carpal tunnel release Right 2004  . Mohs surgery  2011    "removed off her nose"  . Cardiac catheterization  10/2009  . Esophageal dilation  X 2    Family History  Problem Relation Age of Onset  . Breast cancer Mother   . Colon cancer Father   . Stroke Father     died with stroke postop  . Diabetes Sister     2 sisters  . Hypertension Other     Social History:  reports that she has never smoked. She has never used smokeless tobacco. She reports that she does not drink alcohol or use illicit drugs.  Allergies:  Allergies  Allergen Reactions  . Amiodarone Other (See Comments)    REACTION: f? fluid retention  . Deltasone [Prednisone] Other (See Comments)    unknown  . Diltiazem Hcl Other (See Comments)  unknown  . Levofloxacin Nausea And Vomiting    REACTION: nausea  . Pregabalin Other (See Comments)    REACTION: hallucinations  . Spironolactone Other (See Comments)    REACTION: elevated K at lowest dose  . Tequin [Gatifloxacin] Other (See Comments)    Hypoglycemia  . Diazepam Anxiety and Other (See Comments)    REACTION: agitation    Medications: I have reviewed the patient's current medications.  Results for orders placed or performed during the hospital encounter of 09/04/14 (from the past 48 hour(s))  Urinalysis, Routine w reflex microscopic     Status: Abnormal   Collection Time: 09/04/14  6:17 PM  Result Value Ref Range   Color, Urine YELLOW YELLOW   APPearance HAZY (A) CLEAR    Specific Gravity, Urine 1.017 1.005 - 1.030   pH 7.0 5.0 - 8.0   Glucose, UA 250 (A) NEGATIVE mg/dL   Hgb urine dipstick NEGATIVE NEGATIVE   Bilirubin Urine NEGATIVE NEGATIVE   Ketones, ur NEGATIVE NEGATIVE mg/dL   Protein, ur 30 (A) NEGATIVE mg/dL   Urobilinogen, UA 0.2 0.0 - 1.0 mg/dL   Nitrite NEGATIVE NEGATIVE   Leukocytes, UA SMALL (A) NEGATIVE  Urine microscopic-add on     Status: Abnormal   Collection Time: 09/04/14  6:17 PM  Result Value Ref Range   Squamous Epithelial / LPF FEW (A) RARE   WBC, UA 21-50 <3 WBC/hpf   Bacteria, UA RARE RARE  Comprehensive metabolic panel     Status: Abnormal   Collection Time: 09/04/14  6:32 PM  Result Value Ref Range   Sodium 137 135 - 145 mmol/L    Comment: Please note change in reference range.   Potassium 4.3 3.5 - 5.1 mmol/L    Comment: Please note change in reference range. DELTA CHECK NOTED    Chloride 104 96 - 112 mEq/L   CO2 26 19 - 32 mmol/L   Glucose, Bld 179 (H) 70 - 99 mg/dL   BUN 29 (H) 6 - 23 mg/dL   Creatinine, Ser 1.11 (H) 0.50 - 1.10 mg/dL   Calcium 9.5 8.4 - 10.5 mg/dL   Total Protein 5.7 (L) 6.0 - 8.3 g/dL   Albumin 3.3 (L) 3.5 - 5.2 g/dL   AST 22 0 - 37 U/L   ALT 19 0 - 35 U/L   Alkaline Phosphatase 62 39 - 117 U/L   Total Bilirubin 0.3 0.3 - 1.2 mg/dL   GFR calc non Af Amer 42 (L) >90 mL/min   GFR calc Af Amer 49 (L) >90 mL/min    Comment: (NOTE) The eGFR has been calculated using the CKD EPI equation. This calculation has not been validated in all clinical situations. eGFR's persistently <90 mL/min signify possible Chronic Kidney Disease.    Anion gap 7 5 - 15  CBC with Differential     Status: None   Collection Time: 09/04/14  6:32 PM  Result Value Ref Range   WBC 4.5 4.0 - 10.5 K/uL   RBC 4.38 3.87 - 5.11 MIL/uL   Hemoglobin 12.5 12.0 - 15.0 g/dL   HCT 39.1 36.0 - 46.0 %   MCV 89.3 78.0 - 100.0 fL   MCH 28.5 26.0 - 34.0 pg   MCHC 32.0 30.0 - 36.0 g/dL   RDW 15.4 11.5 - 15.5 %   Platelets 163 150  - 400 K/uL   Neutrophils Relative % 73 43 - 77 %   Neutro Abs 3.3 1.7 - 7.7 K/uL   Lymphocytes Relative 14  12 - 46 %   Lymphs Abs 0.7 0.7 - 4.0 K/uL   Monocytes Relative 11 3 - 12 %   Monocytes Absolute 0.5 0.1 - 1.0 K/uL   Eosinophils Relative 2 0 - 5 %   Eosinophils Absolute 0.1 0.0 - 0.7 K/uL   Basophils Relative 0 0 - 1 %   Basophils Absolute 0.0 0.0 - 0.1 K/uL  Troponin I     Status: None   Collection Time: 09/04/14  6:32 PM  Result Value Ref Range   Troponin I <0.03 <0.031 ng/mL    Comment:        NO INDICATION OF MYOCARDIAL INJURY. Please note change in reference range.   Protime-INR     Status: Abnormal   Collection Time: 09/04/14  9:00 PM  Result Value Ref Range   Prothrombin Time 25.0 (H) 11.6 - 15.2 seconds   INR 2.25 (H) 0.00 - 1.49  Comprehensive metabolic panel     Status: Abnormal   Collection Time: 09/05/14  4:06 AM  Result Value Ref Range   Sodium 139 135 - 145 mmol/L    Comment: Please note change in reference range.   Potassium 4.0 3.5 - 5.1 mmol/L    Comment: Please note change in reference range.   Chloride 102 96 - 112 mEq/L   CO2 30 19 - 32 mmol/L   Glucose, Bld 89 70 - 99 mg/dL   BUN 21 6 - 23 mg/dL   Creatinine, Ser 1.02 0.50 - 1.10 mg/dL   Calcium 9.6 8.4 - 10.5 mg/dL   Total Protein 5.4 (L) 6.0 - 8.3 g/dL   Albumin 3.2 (L) 3.5 - 5.2 g/dL   AST 23 0 - 37 U/L   ALT 22 0 - 35 U/L   Alkaline Phosphatase 59 39 - 117 U/L   Total Bilirubin 0.4 0.3 - 1.2 mg/dL   GFR calc non Af Amer 47 (L) >90 mL/min   GFR calc Af Amer 54 (L) >90 mL/min    Comment: (NOTE) The eGFR has been calculated using the CKD EPI equation. This calculation has not been validated in all clinical situations. eGFR's persistently <90 mL/min signify possible Chronic Kidney Disease.    Anion gap 7 5 - 15  CBC with Differential     Status: None   Collection Time: 09/05/14  4:06 AM  Result Value Ref Range   WBC 4.9 4.0 - 10.5 K/uL   RBC 4.31 3.87 - 5.11 MIL/uL   Hemoglobin  12.5 12.0 - 15.0 g/dL   HCT 38.4 36.0 - 46.0 %   MCV 89.1 78.0 - 100.0 fL   MCH 29.0 26.0 - 34.0 pg   MCHC 32.6 30.0 - 36.0 g/dL   RDW 15.5 11.5 - 15.5 %   Platelets 172 150 - 400 K/uL   Neutrophils Relative % 72 43 - 77 %   Neutro Abs 3.5 1.7 - 7.7 K/uL   Lymphocytes Relative 17 12 - 46 %   Lymphs Abs 0.8 0.7 - 4.0 K/uL   Monocytes Relative 9 3 - 12 %   Monocytes Absolute 0.5 0.1 - 1.0 K/uL   Eosinophils Relative 2 0 - 5 %   Eosinophils Absolute 0.1 0.0 - 0.7 K/uL   Basophils Relative 0 0 - 1 %   Basophils Absolute 0.0 0.0 - 0.1 K/uL  Glucose, capillary     Status: None   Collection Time: 09/05/14  6:47 AM  Result Value Ref Range   Glucose-Capillary 73 70 - 99  mg/dL   Comment 1 Documented in Chart    Comment 2 Notify RN     Dg Chest 2 View  09/04/2014   CLINICAL DATA:  Chest rales.  EXAM: CHEST  2 VIEW  COMPARISON:  Radiograph 08/27/2014  FINDINGS: Patient is rotated to the right. Diminished hyperinflation from prior. Cardiomediastinal contours are normal and unchanged. Atherosclerosis noted of the aortic arch. Pulmonary vasculature is normal. Minimal atelectasis or scarring in the left midlung zone. Decreased right basilar atelectasis. No confluent airspace disease. No pleural effusion or pneumothorax. The bones appear under mineralized. Compression deformities again noted in the thoracic spine  IMPRESSION: No acute pulmonary process.   Electronically Signed   By: Rubye Oaks M.D.   On: 09/04/2014 19:02   Ct Abdomen Pelvis W Contrast  09/04/2014   CLINICAL DATA:  Lower abdomen and left flank pain. Recently treated for a urinary tract infection. Previous appendectomy and hysterectomy.  EXAM: CT ABDOMEN AND PELVIS WITH CONTRAST  TECHNIQUE: Multidetector CT imaging of the abdomen and pelvis was performed using the standard protocol following bolus administration of intravenous contrast.  CONTRAST:  OMNIPAQUE IOHEXOL 300 MG/ML  SOLN  COMPARISON:  Pelvis CT dated 06/23/2008 and  abdomen CT dated 01/28/2008.  FINDINGS: The previously demonstrated 2.9 cm fat containing left adrenal mass currently measures 2.8 x 1.6 cm on image 19. There is a large mass in the right colon extending superior and inferior to the ileocecal valve. On coronal image number 58, this measures 5.8 x 4.6 cm. On axial image number 41, this measures 4.2 cm in AP diameter.  Increased prominence of bilateral extrarenal pelves with interval distention of the urinary bladder. Mild inferior bladder wall thickening as well as partial vaginal prolapse of the bladder. No urinary tract calculi are seen.  Mild diffuse low density of the liver relative to the spleen. 1.2 x 0.7 cm polyp or non calcified gallstone in the gallbladder fundus on image number 29. Unremarkable spleen, pancreas and right adrenal gland. The previously demonstrated 1.2 x 1.0 cm mid left renal mass suspicious for a primary renal neoplasm is slightly larger, currently measuring 1.6 x 1.3 cm on image number 27. This measures 72 Hounsfield units in density.  Old, healed bilateral pubic fractures. Diffuse osteopenia. Multiple old lumbar and lower thoracic vertebral compression fractures. Some of these are associated with bony retropulsion. This is most pronounced at the upper L2 level, with moderate canal stenosis. No acute fracture lines are seen. Atheromatous arterial calcifications. Clear lung bases.  IMPRESSION: 1. 5.8 x 4.6 x 4.2 cm polypoid right colon mass extending superior and inferior to the ileocecal valve. This is concerning for a primary colon neoplasm. 2. Mild increase in size of the previously demonstrated left renal mass suspicious for a renal cell carcinoma on a previous MR. 3. Prolapse of the urinary bladder into the upper vagina. 4. Mild inferior bladder wall thickening. This could be due to plaque-like tumor or reactive changes due to chronic prolapse into the vagina. 5. Distention of the urinary bladder with associated mild bilateral  hydronephrosis. 6. 1.2 cm arm polyp or noncalcified gallstone in the gallbladder without evidence of cholecystitis. 7. Stable left adrenal myelolipoma. 8. Diffuse osteopenia with old vertebral compression fractures and old, healed bilateral pubic fractures. 9. Mild diffuse hepatic steatosis.   Electronically Signed   By: Gordan Payment M.D.   On: 09/04/2014 21:36   US Renal  09/05/2014   CLINICAL DATA:  Elevated BUN and creatinine in, concern for hydronephrosis,  history of renal stones and diabetes  EXAM: RENAL/URINARY TRACT ULTRASOUND COMPLETE  COMPARISON:  09/04/2014 CT scan  FINDINGS: Right Kidney:  Length: 8.6 cm. Normal echogenicity. Cortical thinning. 11 mm upper pole cyst. No hydronephrosis.  Left Kidney:  Length: 8.9 cm. Normal echogenicity with cortical thinning. As seen on CT scan, there is an oval lesion upper pole to midpole left kidney measuring 16 x 12 x 14 mm. It is hypoechoic with evidence of some vascularity in the wall.  Bladder:  Appears normal for degree of bladder distention.  IMPRESSION: Bilateral atrophy.  No hydronephrosis.  Indeterminate lesion upper pole left kidney measuring 16 mm. Solid mass not excluded. This has been seen on prior CT scan and MRI and has been considered suspicious for renal cell carcinoma.   Electronically Signed   By: Skipper Cliche M.D.   On: 09/05/2014 08:36    Review of Systems  Constitutional: Positive for malaise/fatigue.  HENT: Negative.   Eyes: Negative.   Respiratory: Negative.   Cardiovascular: Negative.   Gastrointestinal: Negative for nausea and abdominal pain.  Genitourinary: Negative for hematuria and flank pain.  Skin: Negative.   Neurological: Positive for weakness.  Endo/Heme/Allergies: Negative.   Psychiatric/Behavioral: Negative.    Blood pressure 194/86, pulse 83, temperature 98.2 F (36.8 C), temperature source Oral, resp. rate 20, height 5' 6.5" (1.689 m), weight 51.755 kg (114 lb 1.6 oz), SpO2 98 %. Physical Exam   Constitutional:  Frail, elderly, daughter and grand-daughter at bedside  HENT:  Head: Normocephalic.  Stigmata of weight loss  Eyes: Pupils are equal, round, and reactive to light.  Neck: Normal range of motion.  Cardiovascular: Normal rate.   Respiratory: Effort normal.  GI: Soft.  Old lower midline scar w/o hernias or pain with palpation  Genitourinary:  No CVAT  Musculoskeletal: Normal range of motion.  Neurological: She is alert.  Skin: Skin is warm.  Psychiatric: She has a normal mood and affect. Her behavior is normal. Judgment and thought content normal.    Assessment/Plan:  1 - Small Left Renal Mass - likely indolent renal tumor. Given very slow growth kinetics, advanced age with comorbidity, do NOT favor any further characterization such as biopsy or furhter dedicated imaging this admission. Periodic surveillance with yearly [place] as outpatient with consideration of possible ablation should lesion become symptomatic or >3cm would be preferred path.   We Sheppard arrange outpatient f/u with plan above.  No need for foley from GU perspective. Pt continent at baseline with excellent overall GFR.  Call with questions anytime.   Lindsay Sheppard 09/05/2014, 9:24 AM

## 2014-09-05 NOTE — Progress Notes (Signed)
INITIAL NUTRITION ASSESSMENT  DOCUMENTATION CODES Per approved criteria  -Underweight   INTERVENTION: Diet advancement per MD Provide Glucerna Shakes BID when diet is advanced, each supplement provides 220 kcal and 10 grams of protein RD to continue to monitor for PO adequacy  NUTRITION DIAGNOSIS: Inadequate oral intake related to abdominal pains and decreased appetite as evidenced by pt's report and 8% weight loss in less than 6 months.   Goal: Pt to meet >/= 90% of their estimated nutrition needs   Monitor:  PO intake, diet advancement, weight trend, labs  Reason for Assessment: Malnutrition Screening Tool, score of 3  81 y.o. female  Admitting Dx: Abdominal pain  ASSESSMENT: 81 year old female history of chronic atrial fibrillation, type 2 diabetes, chronic kidney disease that was admitted for left flank plain and found to have a right colonic mass as well as renal mass on CT of the abdomen and pelvis.  Pt reports that she has been eating poorly for the past 5 days due to abdominal pain. She recently started drinking Glucerna Shakes at home 2-3 times daily. Pt reports that she was eating well prior to 5 days ago. Per family at bedside pt weighed 130 lbs less than one year ago and has been gradually losing weight. Today, pt is on a clear liquid diet.  Labs reviewed.   Nutrition Focused Physical Exam:  Subcutaneous Fat:  Orbital Region: wnl Upper Arm Region: moderate wasting Thoracic and Lumbar Region: NA  Muscle:  Temple Region: moderate wasting Clavicle Bone Region: moderate wasting Clavicle and Acromion Bone Region: moderate wasting Scapular Bone Region: NA Dorsal Hand: mild wasting Patellar Region: moderate wasting Anterior Thigh Region: moderate wasting Posterior Calf Region: mild wasting  Edema: none noted   Height: Ht Readings from Last 1 Encounters:  09/05/14 5' 6.5" (1.689 m)    Weight: Wt Readings from Last 1 Encounters:  09/05/14 114 lb 1.6 oz  (51.755 kg)    Ideal Body Weight: 133 lbs  % Ideal Body Weight: 85%  Wt Readings from Last 10 Encounters:  09/05/14 114 lb 1.6 oz (51.755 kg)  08/28/14 116 lb (52.617 kg)  08/08/14 115 lb (52.164 kg)  05/30/14 120 lb (54.432 kg)  03/22/14 124 lb 12.5 oz (56.6 kg)  03/15/14 119 lb 14.9 oz (54.4 kg)  02/23/14 122 lb (55.339 kg)  02/16/14 123 lb 3.2 oz (55.883 kg)  02/09/14 116 lb 13.5 oz (53 kg)  11/30/13 130 lb (58.968 kg)    Usual Body Weight: 130 lbs  % Usual Body Weight: 88%  BMI:  Body mass index is 18.14 kg/(m^2). (Underweight)  Estimated Nutritional Needs: Kcal: 8144-8185 Protein: 65-75 grams Fluid: 1.5 L/day  Skin: intact  Diet Order: Diet clear liquid  EDUCATION NEEDS: -No education needs identified at this time   Intake/Output Summary (Last 24 hours) at 09/05/14 1641 Last data filed at 09/05/14 1300  Gross per 24 hour  Intake    900 ml  Output   2240 ml  Net  -1340 ml    Last BM: 12/26   Labs:   Recent Labs Lab 09/04/14 1832 09/05/14 0406  NA 137 139  K 4.3 4.0  CL 104 102  CO2 26 30  BUN 29* 21  CREATININE 1.11* 1.02  CALCIUM 9.5 9.6  GLUCOSE 179* 89    CBG (last 3)   Recent Labs  09/05/14 0647 09/05/14 1149 09/05/14 1631  GLUCAP 73 197* 121*    Scheduled Meds: . cloNIDine  0.3 mg Transdermal Weekly  .  cyanocobalamin  1,000 mcg Subcutaneous Q30 days  . donepezil  10 mg Oral QHS  . feeding supplement (GLUCERNA SHAKE)  237 mL Oral Daily  . ferrous sulfate  325 mg Oral BID WC  . fluticasone  2 spray Each Nare Daily  . insulin aspart  0-9 Units Subcutaneous TID WC  . insulin glargine  15 Units Subcutaneous Daily  . labetalol  400 mg Oral BID  . pantoprazole  40 mg Oral Daily  . piperacillin-tazobactam (ZOSYN)  IV  3.375 g Intravenous Q8H  . pravastatin  20 mg Oral QPM  . Warfarin - Pharmacist Dosing Inpatient   Does not apply q1800    Continuous Infusions: . sodium chloride 75 mL/hr at 09/05/14 0300    Past Medical  History  Diagnosis Date  . POSTHERPETIC NEURALGIA 11/02/2009  . B12 DEFICIENCY 05/23/2007  . HYPERLIPIDEMIA 05/23/2007  . HYPERKALEMIA 05/30/2010  . ANEMIA-IRON DEFICIENCY 01/26/2007  . BLEPHARITIS, BILATERAL 02/23/2008  . Other specified forms of hearing loss 05/30/2010  . HYPERTENSION 01/26/2007  . Atrial fibrillation 05/23/2007  . DIASTOLIC HEART FAILURE, CHRONIC 12/14/2009  . POSTURAL HYPOTENSION 02/07/2008  . ALLERGIC RHINITIS 11/02/2009  . GERD 01/26/2007  . CONSTIPATION 01/25/2008  . SKIN LESION 05/30/2010  . BACK PAIN 08/02/2007  . OSTEOPOROSIS 05/23/2007  . SYNCOPE 02/07/2008  . FATIGUE 09/06/2007  . Dysuria 09/05/2008  . Abdominal pain, epigastric 09/06/2007  . EPIGASTRIC TENDERNESS 10/12/2007  . PULMONARY EMBOLISM, HX OF 05/23/2007  . SHINGLES, HX OF 05/23/2007  . 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 Left foot pain   . Diabetic neuropathy   . RENAL INSUFFICIENCY 07/02/2009  . RENAL CYST, LEFT 09/06/2007  . Kidney stones     "never had OR"  . Family history of anesthesia complication     "daughter has PONV; another daughter a little anesthesia lasts way too long"  . CHF (congestive heart failure) 2011  . Myocardial infarction 2011    "mild"  . DVT (deep venous thrombosis) ~ 1964    "think it was in the left"  . Aspiration pneumonia ~ 2006    "due to aspiration"  . DIABETES MELLITUS, TYPE II 09/06/2007  . DEPRESSIVE DISORDER 01/25/2008    "@ times; never treated for it"  . Melanoma of nose   . Compression fracture of lumbar vertebra     hx  . Compression fracture of thoracic vertebra 02/07/2014    "fell this am" (02/07/2014  . Arthritis     "hands" (03/15/2014)    Past Surgical History  Procedure Laterality Date  . Appendectomy  1943  . Abdominal hysterectomy  1964  . Cardiac electrophysiology mapping and ablation  1999  . Cataract extraction w/ intraocular lens  implant, bilateral  2003-2005  . Carpal tunnel release Right 2004  . Mohs surgery  2011    "removed off her nose"  . Cardiac  catheterization  10/2009  . Esophageal dilation  X 2    Pryor Ochoa RD, LDN Inpatient Clinical Dietitian Pager: [phone] After Hours Pager: [phone]

## 2014-09-05 NOTE — Progress Notes (Signed)
ANTICOAGULATION AND ANTIBIOTIC CONSULT NOTE - Initial Consult  Pharmacy Consult for Coumadin, Zosyn  Indication: Afib, UTI   Allergies  Allergen Reactions  . Amiodarone Other (See Comments)    REACTION: f? fluid retention  . Deltasone [Prednisone] Other (See Comments)    unknown  . Diltiazem Hcl Other (See Comments)    unknown  . Levofloxacin Nausea And Vomiting    REACTION: nausea  . Pregabalin Other (See Comments)    REACTION: hallucinations  . Spironolactone Other (See Comments)    REACTION: elevated K at lowest dose  . Tequin [Gatifloxacin] Other (See Comments)    Hypoglycemia  . Diazepam Anxiety and Other (See Comments)    REACTION: agitation    Patient Measurements: Height: 5' 6.5" (168.9 cm) Weight: 114 lb 1.6 oz (51.755 kg) IBW/kg (Calculated) : 60.45 Heparin Dosing Weight: 52 kg   Vital Signs: Temp: 97.5 F (36.4 C) (01/05 0025) Temp Source: Oral (01/05 0025) BP: 206/93 mmHg (01/05 0025) Pulse Rate: 79 (01/05 0025)  Labs:  Recent Labs  09/04/14 1832 09/04/14 2100  HGB 12.5  --   HCT 39.1  --   PLT 163  --   LABPROT  --  25.0*  INR  --  2.25*  CREATININE 1.11*  --   TROPONINI <0.03  --     Estimated Creatinine Clearance: 27 mL/min (by C-G formula based on Cr of 1.11).   Medical History: Past Medical History  Diagnosis Date  . POSTHERPETIC NEURALGIA 11/02/2009  . B12 DEFICIENCY 05/23/2007  . HYPERLIPIDEMIA 05/23/2007  . HYPERKALEMIA 05/30/2010  . ANEMIA-IRON DEFICIENCY 01/26/2007  . BLEPHARITIS, BILATERAL 02/23/2008  . Other specified forms of hearing loss 05/30/2010  . HYPERTENSION 01/26/2007  . Atrial fibrillation 05/23/2007  . DIASTOLIC HEART FAILURE, CHRONIC 12/14/2009  . POSTURAL HYPOTENSION 02/07/2008  . ALLERGIC RHINITIS 11/02/2009  . GERD 01/26/2007  . CONSTIPATION 01/25/2008  . SKIN LESION 05/30/2010  . BACK PAIN 08/02/2007  . OSTEOPOROSIS 05/23/2007  . SYNCOPE 02/07/2008  . FATIGUE 09/06/2007  . Dysuria 09/05/2008  . Abdominal pain, epigastric  09/06/2007  . EPIGASTRIC TENDERNESS 10/12/2007  . PULMONARY EMBOLISM, HX OF 05/23/2007  . SHINGLES, HX OF 05/23/2007  . 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 Left foot pain   . Diabetic neuropathy   . RENAL INSUFFICIENCY 07/02/2009  . RENAL CYST, LEFT 09/06/2007  . Kidney stones     "never had OR"  . Family history of anesthesia complication     "daughter has PONV; another daughter a little anesthesia lasts way too long"  . CHF (congestive heart failure) 2011  . Myocardial infarction 2011    "mild"  . DVT (deep venous thrombosis) ~ 1964    "think it was in the left"  . Aspiration pneumonia ~ 2006    "due to aspiration"  . DIABETES MELLITUS, TYPE II 09/06/2007  . DEPRESSIVE DISORDER 01/25/2008    "@ times; never treated for it"  . Melanoma of nose   . Compression fracture of lumbar vertebra     hx  . Compression fracture of thoracic vertebra 02/07/2014    "fell this am" (02/07/2014  . Arthritis     "hands" (03/15/2014)    Medications:  Prescriptions prior to admission  Medication Sig Dispense Refill Last Dose  . acetaminophen (TYLENOL) 325 MG tablet Take 2 tablets (650 mg total) by mouth 3 (three) times daily.   09/04/2014 at 330p  . cefdinir (OMNICEF) 300 MG capsule Take 1 capsule (300 mg total) by mouth 2 (two) times  daily. 10 capsule 0 09/03/2014 at Unknown time  . cloNIDine (CATAPRES - DOSED IN MG/24 HR) 0.3 mg/24hr patch Place 0.3 mg onto the skin once a week.   09/04/2014 at Unknown time  . CRANBERRY PO Take 500 mg by mouth daily.    09/03/2014 at Unknown time  . donepezil (ARICEPT) 10 MG tablet Take 1 tablet (10 mg total) by mouth at bedtime. 90 tablet 3 09/03/2014 at Unknown time  . feeding supplement, GLUCERNA SHAKE, (GLUCERNA SHAKE) LIQD Take 237 mLs by mouth 2 (two) times daily between meals. (Patient taking differently: Take 237 mLs by mouth daily. )  0 Past Week at Unknown time  . ferrous sulfate 325 (65 FE) MG tablet Take 1 tablet (325 mg total) by mouth 2 (two) times daily with a meal. 60  tablet 1 09/04/2014 at Unknown time  . fluticasone (FLONASE) 50 MCG/ACT nasal spray Place 2 sprays into the nose daily. 16 g 2 09/04/2014 at Unknown time  . Hypromellose (ARTIFICIAL TEARS OP) Place 1 drop into both eyes daily.   09/04/2014 at Unknown time  . insulin glargine (LANTUS) 100 UNIT/ML injection Inject 23 Units into the skin daily.    09/04/2014 at Unknown time  . labetalol (NORMODYNE) 200 MG tablet Take 400 mg by mouth 2 (two) times daily.   09/04/2014 at 8a  . pantoprazole (PROTONIX) 40 MG tablet Take 1 tablet (40 mg total) by mouth daily. 90 tablet 3 09/04/2014 at Unknown time  . polyethylene glycol (MIRALAX / GLYCOLAX) packet Take 17 g by mouth 2 (two) times daily. 14 each 0 Past Month at Unknown time  . pravastatin (PRAVACHOL) 40 MG tablet Take 20 mg by mouth every evening.    09/03/2014 at Unknown time  . warfarin (COUMADIN) 2 MG tablet Follow instructions per coumadin clinic (Patient taking differently: Take 2-3 mg by mouth daily. Take 3 mg daily EXCEPT 2mg  on MWF) 90 tablet 0 09/03/2014 at 9pm  . cyanocobalamin (,VITAMIN B-12,) 1000 MCG/ML injection INJECT 1 ML (1000 MCG) INTO THE MUSCLE EVERY 30 DAYS. 1 mL 11 07-29-14    Assessment: 89 YOF with left sided flank pain and h/o Afib. Pharmacy consulted to start Zosyn for UTI and resume Coumadin for AFib. INR on admission is therapeutic at 2.25. Per med rec, patient's last dose of Coumadin was on 1/3. H/H and Plt wnl.  Home Coumadin Dose: 3 mg daily except 2 mg on MWF.   Goal of Therapy:  INR 2-3 Monitor platelets by anticoagulation protocol: Yes   Plan:  -Zosyn 3.375 gm IV Q 8 hours -Resume home coumadin dose  -Monitor daily PT/INR, CBC, cultures and patient's clinical progress    Albertina Parr, PharmD., BCPS Clinical Pharmacist Pager [phone]

## 2014-09-05 NOTE — Progress Notes (Signed)
CARE MANAGEMENT NOTE 09/05/2014  Patient:  Lindsay Sheppard, Lindsay Sheppard   Account Number:  [id number]  Date Initiated:  09/05/2014  Documentation initiated by:  Olga Coaster  Subjective/Objective Assessment:   ADMITTED WITH UTI, ABDOMINAL PAIN     Action/Plan:   PATIENT IS ACTIVE WITH [facility] FROM PREVIOUS ADMISSION   Anticipated DC Date:  09/09/2014   Anticipated DC Plan:  [place]  CM consult           Status of service:  In process, will continue to follow Medicare Important Message given?   (If response is "NO", the following Medicare IM given date fields will be blank)  Per UR Regulation:  Reviewed for med. necessity/level of care/duration of stay  Comments:  1/5/2016Mindi Slicker RN,BSN,MHA [phone]

## 2014-09-05 NOTE — H&P (Signed)
[facility] History and Physical  Lindsay Sheppard MVE:[id number] DOB: 25-Jun-1921 DOA: 09/04/2014  Referring physician: ER physician. PCP: [name], MD   Chief Complaint: Left flank pain.  HPI: Lindsay Sheppard is a 81 y.o. female with history of chronic atrial fibrillation, diabetes mellitus type 2, chronic kidney disease who was recently admitted for accelerated hypertension presents to the ER because of increasing left flank pain since yesterday. Patient's family also noted that patient has not been urinating for the last few hours before being to the ER. In the ER patient had a CT abdomen and pelvis which show left renal mass and also a right colon mass. It also shows mild hydronephrosis. UA shows features consistent with UTI. In addition patient will purchase found to be significantly elevated with systolic blood pressure more than 200. Patient has been admitted for further management. Patient denies any chest pain or shortness of breath. After patient came to the ER patient was able to void urine spontaneously and at 1700 mL voided following which patient's pain has largely reduced.   Review of Systems: As presented in the history of presenting illness, rest negative.  Past Medical History  Diagnosis Date  . POSTHERPETIC NEURALGIA 11/02/2009  . B12 DEFICIENCY 05/23/2007  . HYPERLIPIDEMIA 05/23/2007  . HYPERKALEMIA 05/30/2010  . ANEMIA-IRON DEFICIENCY 01/26/2007  . BLEPHARITIS, BILATERAL 02/23/2008  . Other specified forms of hearing loss 05/30/2010  . HYPERTENSION 01/26/2007  . Atrial fibrillation 05/23/2007  . DIASTOLIC HEART FAILURE, CHRONIC 12/14/2009  . POSTURAL HYPOTENSION 02/07/2008  . ALLERGIC RHINITIS 11/02/2009  . GERD 01/26/2007  . CONSTIPATION 01/25/2008  . SKIN LESION 05/30/2010  . BACK PAIN 08/02/2007  . OSTEOPOROSIS 05/23/2007  . SYNCOPE 02/07/2008  . FATIGUE 09/06/2007  . Dysuria 09/05/2008  . Abdominal pain, epigastric 09/06/2007  . EPIGASTRIC TENDERNESS 10/12/2007  . PULMONARY  EMBOLISM, HX OF 05/23/2007  . SHINGLES, HX OF 05/23/2007  . 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 Left foot pain   . Diabetic neuropathy   . RENAL INSUFFICIENCY 07/02/2009  . RENAL CYST, LEFT 09/06/2007  . Kidney stones     "never had OR"  . Family history of anesthesia complication     "daughter has PONV; another daughter a little anesthesia lasts way too long"  . CHF (congestive heart failure) 2011  . Myocardial infarction 2011    "mild"  . DVT (deep venous thrombosis) ~ 1964    "think it was in the left"  . Aspiration pneumonia ~ 2006    "due to aspiration"  . DIABETES MELLITUS, TYPE II 09/06/2007  . DEPRESSIVE DISORDER 01/25/2008    "@ times; never treated for it"  . Melanoma of nose   . Compression fracture of lumbar vertebra     hx  . Compression fracture of thoracic vertebra 02/07/2014    "fell this am" (02/07/2014  . Arthritis     "hands" (03/15/2014)   Past Surgical History  Procedure Laterality Date  . Appendectomy  1943  . Abdominal hysterectomy  1964  . Cardiac electrophysiology mapping and ablation  1999  . Cataract extraction w/ intraocular lens  implant, bilateral  2003-2005  . Carpal tunnel release Right 2004  . Mohs surgery  2011    "removed off her nose"  . Cardiac catheterization  10/2009  . Esophageal dilation  X 2   Social History:  reports that she has never smoked. She has never used smokeless tobacco. She reports that she does not drink alcohol or use illicit drugs. Where  does patient live at home. Can patient participate in ADLs? Yes.  Allergies  Allergen Reactions  . Amiodarone Other (See Comments)    REACTION: f? fluid retention  . Deltasone [Prednisone] Other (See Comments)    unknown  . Diltiazem Hcl Other (See Comments)    unknown  . Levofloxacin Nausea And Vomiting    REACTION: nausea  . Pregabalin Other (See Comments)    REACTION: hallucinations  . Spironolactone Other (See Comments)    REACTION: elevated K at lowest dose  . Tequin  [Gatifloxacin] Other (See Comments)    Hypoglycemia  . Diazepam Anxiety and Other (See Comments)    REACTION: agitation    Family History:  Family History  Problem Relation Age of Onset  . Breast cancer Mother   . Colon cancer Father   . Stroke Father     died with stroke postop  . Diabetes Sister     2 sisters  . Hypertension Other       Prior to Admission medications   Medication Sig Start Date End Date Taking? Authorizing Provider  acetaminophen (TYLENOL) 325 MG tablet Take 2 tablets (650 mg total) by mouth 3 (three) times daily. 02/09/14  Yes Delfina Redwood, MD  cefdinir (OMNICEF) 300 MG capsule Take 1 capsule (300 mg total) by mouth 2 (two) times daily. 08/29/14  Yes Velvet Bathe, MD  cloNIDine (CATAPRES - DOSED IN MG/24 HR) 0.3 mg/24hr patch Place 0.3 mg onto the skin once a week.   Yes Historical Provider, MD  CRANBERRY PO Take 500 mg by mouth daily.    Yes Historical Provider, MD  donepezil (ARICEPT) 10 MG tablet Take 1 tablet (10 mg total) by mouth at bedtime. 08/08/14  Yes Biagio Borg, MD  feeding supplement, GLUCERNA SHAKE, (GLUCERNA SHAKE) LIQD Take 237 mLs by mouth 2 (two) times daily between meals. Patient taking differently: Take 237 mLs by mouth daily.  08/29/14  Yes Velvet Bathe, MD  ferrous sulfate 325 (65 FE) MG tablet Take 1 tablet (325 mg total) by mouth 2 (two) times daily with a meal. 02/15/14  Yes Ivan Anchors Love, PA-C  fluticasone (FLONASE) 50 MCG/ACT nasal spray Place 2 sprays into the nose daily. 03/22/13  Yes Biagio Borg, MD  Hypromellose (ARTIFICIAL TEARS OP) Place 1 drop into both eyes daily.   Yes Historical Provider, MD  insulin glargine (LANTUS) 100 UNIT/ML injection Inject 23 Units into the skin daily.    Yes Historical Provider, MD  labetalol (NORMODYNE) 200 MG tablet Take 400 mg by mouth 2 (two) times daily.   Yes Historical Provider, MD  pantoprazole (PROTONIX) 40 MG tablet Take 1 tablet (40 mg total) by mouth daily. 07/20/13  Yes Biagio Borg, MD   polyethylene glycol [facility] Floria Raveling) packet Take 17 g by mouth 2 (two) times daily. 02/15/14  Yes Ivan Anchors Love, PA-C  pravastatin (PRAVACHOL) 40 MG tablet Take 20 mg by mouth every evening.  07/20/13  Yes Biagio Borg, MD  warfarin (COUMADIN) 2 MG tablet Follow instructions per coumadin clinic Patient taking differently: Take 2-3 mg by mouth daily. Take 3 mg daily EXCEPT 2mg  on MWF 07/13/14  Yes Biagio Borg, MD  cyanocobalamin (,VITAMIN B-12,) 1000 MCG/ML injection INJECT 1 ML (1000 MCG) INTO THE MUSCLE EVERY 30 DAYS. 04/25/14   Biagio Borg, MD    Physical Exam: Filed Vitals:   09/04/14 2315 09/04/14 2330 09/04/14 2345 09/05/14 0025  BP:  222/106  206/93  Pulse: 85 85  86 79  Temp:    97.5 F (36.4 C)  TempSrc:    Oral  Resp: 18 20 28 20   Height:    5' 6.5" (1.689 m)  Weight:    51.755 kg (114 lb 1.6 oz)  SpO2: 98% 99% 99% 98%     General:  Poorly built and nourished.  Eyes: Anicteric no pallor.  ENT: No discharge from the ears eyes nose and mouth.  Neck: No mass felt. No neck rigidity.  Cardiovascular: S1-S2 heard.  Respiratory: No rhonchi or crepitations.  Abdomen: Soft nontender bowel sounds present.  Skin: No rash.  Musculoskeletal: No edema.  Psychiatric: Appears normal.  Neurologic: Alert awake oriented to place and person and moves all extremities.  Labs on Admission:  Basic Metabolic Panel:  Recent Labs Lab 09/04/14 1832  NA 137  K 4.3  CL 104  CO2 26  GLUCOSE 179*  BUN 29*  CREATININE 1.11*  CALCIUM 9.5   Liver Function Tests:  Recent Labs Lab 09/04/14 1832  AST 22  ALT 19  ALKPHOS 62  BILITOT 0.3  PROT 5.7*  ALBUMIN 3.3*   No results for input(s): LIPASE, AMYLASE in the last 168 hours. No results for input(s): AMMONIA in the last 168 hours. CBC:  Recent Labs Lab 08/29/14 0540 09/04/14 1832  WBC 9.3 4.5  NEUTROABS  --  3.3  HGB 13.7 12.5  HCT 42.6 39.1  MCV 86.1 89.3  PLT 148* 163   Cardiac Enzymes:  Recent  Labs Lab 09/04/14 1832  TROPONINI <0.03    BNP (last 3 results) No results for input(s): PROBNP in the last 8760 hours. CBG:  Recent Labs Lab 08/29/14 0745 08/29/14 1124  GLUCAP 79 175*    Radiological Exams on Admission: Dg Chest 2 View  09/04/2014   CLINICAL DATA:  Chest rales.  EXAM: CHEST  2 VIEW  COMPARISON:  Radiograph 08/27/2014  FINDINGS: Patient is rotated to the right. Diminished hyperinflation from prior. Cardiomediastinal contours are normal and unchanged. Atherosclerosis noted of the aortic arch. Pulmonary vasculature is normal. Minimal atelectasis or scarring in the left midlung zone. Decreased right basilar atelectasis. No confluent airspace disease. No pleural effusion or pneumothorax. The bones appear under mineralized. Compression deformities again noted in the thoracic spine  IMPRESSION: No acute pulmonary process.   Electronically Signed   By: Jeb Levering M.D.   On: 09/04/2014 19:02   Ct Abdomen Pelvis W Contrast  09/04/2014   CLINICAL DATA:  Lower abdomen and left flank pain. Recently treated for a urinary tract infection. Previous appendectomy and hysterectomy.  EXAM: CT ABDOMEN AND PELVIS WITH CONTRAST  TECHNIQUE: Multidetector CT imaging of the abdomen and pelvis was performed using the standard protocol following bolus administration of intravenous contrast.  CONTRAST:  166mL OMNIPAQUE IOHEXOL 300 MG/ML  SOLN  COMPARISON:  Pelvis CT dated 06/23/2008 and abdomen CT dated 01/28/2008.  FINDINGS: The previously demonstrated 2.9 cm fat containing left adrenal mass currently measures 2.8 x 1.6 cm on image 19. There is a large mass in the right colon extending superior and inferior to the ileocecal valve. On coronal image number 58, this measures 5.8 x 4.6 cm. On axial image number 41, this measures 4.2 cm in AP diameter.  Increased prominence of bilateral extrarenal pelves with interval distention of the urinary bladder. Mild inferior bladder wall thickening as well as  partial vaginal prolapse of the bladder. No urinary tract calculi are seen.  Mild diffuse low density of the liver relative to the  spleen. 1.2 x 0.7 cm polyp or non calcified gallstone in the gallbladder fundus on image number 29. Unremarkable spleen, pancreas and right adrenal gland. The previously demonstrated 1.2 x 1.0 cm mid left renal mass suspicious for a primary renal neoplasm is slightly larger, currently measuring 1.6 x 1.3 cm on image number 27. This measures 72 Hounsfield units in density.  Old, healed bilateral pubic fractures. Diffuse osteopenia. Multiple old lumbar and lower thoracic vertebral compression fractures. Some of these are associated with bony retropulsion. This is most pronounced at the upper L2 level, with moderate canal stenosis. No acute fracture lines are seen. Atheromatous arterial calcifications. Clear lung bases.  IMPRESSION: 1. 5.8 x 4.6 x 4.2 cm polypoid right colon mass extending superior and inferior to the ileocecal valve. This is concerning for a primary colon neoplasm. 2. Mild increase in size of the previously demonstrated left renal mass suspicious for a renal cell carcinoma on a previous MR. 3. Prolapse of the urinary bladder into the upper vagina. 4. Mild inferior bladder wall thickening. This could be due to plaque-like tumor or reactive changes due to chronic prolapse into the vagina. 5. Distention of the urinary bladder with associated mild bilateral hydronephrosis. 6. 1.2 cm arm polyp or noncalcified gallstone in the gallbladder without evidence of cholecystitis. 7. Stable left adrenal myelolipoma. 8. Diffuse osteopenia with old vertebral compression fractures and old, healed bilateral pubic fractures. 9. Mild diffuse hepatic steatosis.   Electronically Signed   By: Enrique Sack M.D.   On: 09/04/2014 21:36     Assessment/Plan Principal Problem:   Abdominal pain Active Problems:   CKD (chronic kidney disease) 3-4   Accelerated hypertension   Chronic atrial  fibrillation   Diabetes mellitus type 2, controlled   Urinary tract infection   Colonic mass   UTI (lower urinary tract infection)   1. Abdominal pain/left flank pain with left renal mass - patient will need urology consult in a.m. Since patient's CAT scan also shows hydronephrosis and patient has voided urine around 1700 mL after coming to the ER I have ordered a renal sonogram to make sure patient's hydronephrosis is improving. Patient's UA also shows features consistent for UTI have placed patient on Zosyn for a biliary UTI. Follow cultures. 2. Right colon mass - I have discussed with on-call surgeon Dr. Donne Hazel was advised to get a GI consult for colonoscopy. Patient's CAT scan does not show any obstruction at this time. 3. Accelerated hypertension - patient was given her home dose of labetalol evening dose and I have placed patient on when necessary IV labetalol. Continue home antihypertensive and closely follow blood pressure trends. Patient's family states that whenever patient gets urinary tract infection her blood pressure goes up. 4. Chronic atrial fibrillation presently rate controlled - Coumadin per pharmacy. 5. Diabetes mellitus type 2 - I have decreased patient's long-acting insulin to 15 units at this time and if patient is able to eat regularly then may increase it back to home dose. Closely follow CBGs and sliding scale coverage. 6. Chronic kidney disease stage III - follow metabolic panel.   DVT Prophylaxis on Coumadin.  Code Status: Full code.  Family Communication: Patient's daughter at the bedside.  Disposition Plan: Admit to inpatient.    Mallori Araque N. [facility] Pager [phone].  If 7PM-7AM, please contact night-coverage [email or web address] Password [email or web address] 09/05/2014, 1:49 AM

## 2014-09-05 NOTE — Telephone Encounter (Signed)
Ok for verbal 

## 2014-09-05 NOTE — Progress Notes (Signed)
Patient did earlier this morning by Dr. Gean Birchwood.  Agree with current assessment and plan.   This is a 81 year old female history of chronic atrial fibrillation, type 2 diabetes, chronic kidney disease that was admitted for left flank plain and found to have a right colonic mass as well as renal mass on CT of the abdomen and pelvis. General surgery was consulted and advised GI consult.  Urology was also consulted, Dr. Tresa Moore.    Abdominal pain/left flank pain with left renal mass/UTI -Patient currently being treated for UTI with Zosyn -Patient has some urinary retention, Will place Foley catheter -Urology has been consulted and appreciated  Right colonic mass -General surgery and GI have been consulted and appreciated -Spoke with daughter regarding CODE STATUS as well as possible need for oncology consult, patient's daughter would like to speak to the rest of the family before making any decisions.  Chronic atrial fibrillation -Currently rate controlled -INR 2.25 -Will hold Coumadin for possible intervention; if no intervention is planned, patient's Coumadin should be restarted  Diabetes mellitus, type II -Continue insulin sliding scale CBG monitoring  Time spent: 25 minutes  Lindsay Sheppard D.O. [facility] Pager [phone]  If 7PM-7AM, please contact night-coverage [email or web address] Password [email or web address] 09/05/2014, 10:14 AM

## 2014-09-06 DIAGNOSIS — Z515 Encounter for palliative care: Secondary | ICD-10-CM | POA: Insufficient documentation

## 2014-09-06 DIAGNOSIS — R634 Abnormal weight loss: Secondary | ICD-10-CM

## 2014-09-06 DIAGNOSIS — R109 Unspecified abdominal pain: Secondary | ICD-10-CM | POA: Insufficient documentation

## 2014-09-06 LAB — GLUCOSE, CAPILLARY
GLUCOSE-CAPILLARY: 155 mg/dL — AB (ref 70–99)
GLUCOSE-CAPILLARY: 117 mg/dL — AB (ref 70–99)
Glucose-Capillary: 111 mg/dL — ABNORMAL HIGH (ref 70–99)
Glucose-Capillary: 185 mg/dL — ABNORMAL HIGH (ref 70–99)

## 2014-09-06 LAB — CBC
HEMATOCRIT: 39.5 % (ref 36.0–46.0)
Hemoglobin: 12.5 g/dL (ref 12.0–15.0)
MCH: 27.5 pg (ref 26.0–34.0)
MCHC: 31.6 g/dL (ref 30.0–36.0)
MCV: 86.8 fL (ref 78.0–100.0)
PLATELETS: 183 10*3/uL (ref 150–400)
RBC: 4.55 MIL/uL (ref 3.87–5.11)
RDW: 15.5 % (ref 11.5–15.5)
WBC: 5.3 10*3/uL (ref 4.0–10.5)

## 2014-09-06 LAB — URINE CULTURE
COLONY COUNT: NO GROWTH
Culture: NO GROWTH

## 2014-09-06 LAB — PROTIME-INR
INR: 2.16 — ABNORMAL HIGH (ref 0.00–1.49)
PROTHROMBIN TIME: 24.3 s — AB (ref 11.6–15.2)

## 2014-09-06 MED ORDER — WARFARIN SODIUM 3 MG PO TABS
3.0000 mg | ORAL_TABLET | Freq: Once | ORAL | Status: AC
  Administered 2014-09-06: 3 mg via ORAL
  Filled 2014-09-06: qty 1

## 2014-09-06 MED ORDER — CEFTRIAXONE SODIUM IN DEXTROSE 20 MG/ML IV SOLN
1.0000 g | INTRAVENOUS | Status: DC
  Administered 2014-09-06 – 2014-09-08 (×3): 1 g via INTRAVENOUS
  Filled 2014-09-06 (×4): qty 50

## 2014-09-06 MED ORDER — RISAQUAD PO CAPS
2.0000 | ORAL_CAPSULE | Freq: Every day | ORAL | Status: DC
  Administered 2014-09-06 – 2014-09-08 (×3): 2 via ORAL
  Filled 2014-09-06 (×3): qty 2

## 2014-09-06 MED ORDER — GLUCERNA SHAKE PO LIQD
237.0000 mL | Freq: Two times a day (BID) | ORAL | Status: DC
  Administered 2014-09-07 – 2014-09-08 (×3): 237 mL via ORAL

## 2014-09-06 MED ORDER — SODIUM CHLORIDE 0.9 % IV SOLN
INTRAVENOUS | Status: AC
  Administered 2014-09-06: 13:00:00 via INTRAVENOUS

## 2014-09-06 NOTE — Consult Note (Signed)
Patient Lindsay Sheppard      DOB: 1921/03/31      IRS:[id number]     Consult Note from the Palliative Medicine Team at [facility] Requested by: Dr Waldron Labs    PCP: [name], MD Reason for Consultation:goals of care    Phone Number:[phone]  Assessment/Recommendations: 81 yo female with dementia, afib, who presented with left flank pain. Found to have colon mass. Family electing not to pursue further work-up. Palliative consulted for goals of care.    1.  Code Status: Full, did not discuss today  2. Goals of Care: Spoke briefly with Lindsay Sheppard and her daughter in law today at bedside.  Also spoke with daughter Lindsay Sheppard over phone. Lindsay Sheppard is able to tell me she has suspected cancer but unsure what this all means. Lindsay Sheppard has discussed with her some as well.  In talking with Lindsay Sheppard some, it sounds like there goal is to get her home.  I introduced the concept of hospice, but this was not in family plans at this time.  They were willing to talk through this some more though tomorrow.  I will plan on meeting with family around [place] tomorrow. Lindsay Sheppard will call her other siblings to see if they can attend.  If they do elect to indeed go home, I would strongly recommend hospice care given her certain continued decline with likely colon cancer.    3. Symptom Management:   1. Weight Loss- Difficult to tell how much from our graphs in Epic.  Could consider appetite stimulant. Will discuss with family tomorrow.   2. Recurrent Flank Pain/UTI- Family reports multiple treatment regimens and this seems to be re-occuring issue.  They would like to talk with Urology about this.  Perhaps we could consider suppressive regimen as outpatient with goal being comfort.  Her prior Cultures did not show particularly resistant infections.   4. Psychosocial/Spiritual: lives in [place]. 5 children.  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     Brief HPI: 81 yo female with PMHx Afib, DM, CKD who presented on  1/5 with left sided flank pain. Recently admitted last week for UTI/HTN as well.  Had recent outpatient treatment for UTI before this as well per daughter.  Per family, flank pain had been increasing for 1-2 days duration with associated decreased urine output.  CT scan performed in ED revealed small renal mass and right sided colon mass.  Family also reports that she has had weight loss, and functional decline over the past few months as well.  They have seen Urology, GI, Surgery during this admission given above.  Thus far, family has not elected to pursue work-up of presumed colon CA.  Palliative care has been asked to see for goals of care.  In seeing Lindsay Sheppard today, her only real complaint is that she is tired of being in bed.  She denies any significant pain, nausea, vomiting, constipation, dyspnea.  Spoke with her daughter-in-law at bedside as well.        PMH:  Past Medical History  Diagnosis Date  . POSTHERPETIC NEURALGIA 11/02/2009  . B12 DEFICIENCY 05/23/2007  . HYPERLIPIDEMIA 05/23/2007  . HYPERKALEMIA 05/30/2010  . ANEMIA-IRON DEFICIENCY 01/26/2007  . BLEPHARITIS, BILATERAL 02/23/2008  . Other specified forms of hearing loss 05/30/2010  . HYPERTENSION 01/26/2007  . Atrial fibrillation 05/23/2007  . DIASTOLIC HEART FAILURE, CHRONIC 12/14/2009  . POSTURAL HYPOTENSION 02/07/2008  . ALLERGIC RHINITIS 11/02/2009  . GERD 01/26/2007  . CONSTIPATION 01/25/2008  .  SKIN LESION 05/30/2010  . BACK PAIN 08/02/2007  . OSTEOPOROSIS 05/23/2007  . SYNCOPE 02/07/2008  . FATIGUE 09/06/2007  . Dysuria 09/05/2008  . Abdominal pain, epigastric 09/06/2007  . EPIGASTRIC TENDERNESS 10/12/2007  . PULMONARY EMBOLISM, HX OF 05/23/2007  . SHINGLES, HX OF 05/23/2007  . 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 Left foot pain   . Diabetic neuropathy   . RENAL INSUFFICIENCY 07/02/2009  . RENAL CYST, LEFT 09/06/2007  . Kidney stones     "never had OR"  . Family history of anesthesia complication     "daughter has PONV; another  daughter a little anesthesia lasts way too long"  . CHF (congestive heart failure) 2011  . Myocardial infarction 2011    "mild"  . DVT (deep venous thrombosis) ~ 1964    "think it was in the left"  . Aspiration pneumonia ~ 2006    "due to aspiration"  . DIABETES MELLITUS, TYPE II 09/06/2007  . DEPRESSIVE DISORDER 01/25/2008    "@ times; never treated for it"  . Melanoma of nose   . Compression fracture of lumbar vertebra     hx  . Compression fracture of thoracic vertebra 02/07/2014    "fell this am" (02/07/2014  . Arthritis     "hands" (03/15/2014)     PSH: Past Surgical History  Procedure Laterality Date  . Appendectomy  1943  . Abdominal hysterectomy  1964  . Cardiac electrophysiology mapping and ablation  1999  . Cataract extraction w/ intraocular lens  implant, bilateral  2003-2005  . Carpal tunnel release Right 2004  . Mohs surgery  2011    "removed off her nose"  . Cardiac catheterization  10/2009  . Esophageal dilation  X 2   I have reviewed the [place] and SH and  If appropriate update it with new information. Allergies  Allergen Reactions  . Amiodarone Other (See Comments)    REACTION: f? fluid retention  . Deltasone [Prednisone] Other (See Comments)    unknown  . Diltiazem Hcl Other (See Comments)    unknown  . Levofloxacin Nausea And Vomiting    REACTION: nausea  . Pregabalin Other (See Comments)    REACTION: hallucinations  . Spironolactone Other (See Comments)    REACTION: elevated K at lowest dose  . Tequin [Gatifloxacin] Other (See Comments)    Hypoglycemia  . Diazepam Anxiety and Other (See Comments)    REACTION: agitation   Scheduled Meds: . acidophilus  2 capsule Oral Daily  . cefTRIAXone (ROCEPHIN)  IV  1 g Intravenous Q24H  . cloNIDine  0.3 mg Transdermal Weekly  . cyanocobalamin  1,000 mcg Subcutaneous Q30 days  . donepezil  10 mg Oral QHS  . feeding supplement (GLUCERNA SHAKE)  237 mL Oral Daily  . ferrous sulfate  325 mg Oral BID WC  .  fluticasone  2 spray Each Nare Daily  . insulin aspart  0-9 Units Subcutaneous TID WC  . insulin glargine  15 Units Subcutaneous Daily  . labetalol  400 mg Oral BID  . pantoprazole  40 mg Oral Daily  . pravastatin  20 mg Oral QPM  . warfarin  3 mg Oral ONCE-1800  . Warfarin - Pharmacist Dosing Inpatient   Does not apply q1800   Continuous Infusions: . sodium chloride 50 mL/hr at 09/06/14 1324   PRN Meds:.acetaminophen **OR** acetaminophen, labetalol, morphine injection, ondansetron **OR** ondansetron (ZOFRAN) IV    BP 165/81 mmHg  Pulse 74  Temp(Src) 97.7 F (36.5 C) (Oral)  Resp  20  Ht 5' 6.5" (1.689 m)  Wt 51.755 kg (114 lb 1.6 oz)  BMI 18.14 kg/m2  SpO2 100%   PPS: 50   Intake/Output Summary (Last 24 hours) at 09/06/14 1516 Last data filed at 09/06/14 0400  Gross per 24 hour  Intake    360 ml  Output   1800 ml  Net  -1440 ml   Physical Exam:  General: Alert, Hard of hearing, NAD HEENT:  [place], mmm, sclera anicteric Chest:   CTAB CVS: regular rate Abdomen: soft, NT, ND   Labs: CBC    Component Value Date/Time   WBC 5.3 09/06/2014 1147   RBC 4.55 09/06/2014 1147   HGB 12.5 09/06/2014 1147   HCT 39.5 09/06/2014 1147   PLT 183 09/06/2014 1147   MCV 86.8 09/06/2014 1147   MCH 27.5 09/06/2014 1147   MCHC 31.6 09/06/2014 1147   RDW 15.5 09/06/2014 1147   LYMPHSABS 0.8 09/05/2014 0406   MONOABS 0.5 09/05/2014 0406   EOSABS 0.1 09/05/2014 0406   BASOSABS 0.0 09/05/2014 0406    BMET    Component Value Date/Time   NA 139 09/05/2014 0406   K 4.0 09/05/2014 0406   CL 102 09/05/2014 0406   CO2 30 09/05/2014 0406   GLUCOSE 89 09/05/2014 0406   BUN 21 09/05/2014 0406   CREATININE 1.02 09/05/2014 0406   CALCIUM 9.6 09/05/2014 0406   GFRNONAA 47* 09/05/2014 0406   GFRAA 54* 09/05/2014 0406    CMP     Component Value Date/Time   NA 139 09/05/2014 0406   K 4.0 09/05/2014 0406   CL 102 09/05/2014 0406   CO2 30 09/05/2014 0406   GLUCOSE 89 09/05/2014  0406   BUN 21 09/05/2014 0406   CREATININE 1.02 09/05/2014 0406   CALCIUM 9.6 09/05/2014 0406   PROT 5.4* 09/05/2014 0406   ALBUMIN 3.2* 09/05/2014 0406   AST 23 09/05/2014 0406   ALT 22 09/05/2014 0406   ALKPHOS 59 09/05/2014 0406   BILITOT 0.4 09/05/2014 0406   GFRNONAA 47* 09/05/2014 0406   GFRAA 54* 09/05/2014 0406   1/4 CT Abd/pelvis IMPRESSION:  1. 5.8 x 4.6 x 4.2 cm polypoid right colon mass extending superior  and inferior to the ileocecal valve. This is concerning for a  primary colon neoplasm.  2. Mild increase in size of the previously demonstrated left renal  mass suspicious for a renal cell carcinoma on a previous MR.  3. Prolapse of the urinary bladder into the upper vagina.  4. Mild inferior bladder wall thickening. This could be due to  plaque-like tumor or reactive changes due to chronic prolapse into  the vagina.  5. Distention of the urinary bladder with associated mild bilateral  hydronephrosis.  6. 1.2 cm arm polyp or noncalcified gallstone in the gallbladder  without evidence of cholecystitis.  7. Stable left adrenal myelolipoma.  8. Diffuse osteopenia with old vertebral compression fractures and  old, healed bilateral pubic fractures.  9. Mild diffuse hepatic steatosis.   1/5 Renal US IMPRESSION:  Bilateral atrophy.  No hydronephrosis.   Indeterminate lesion upper pole left kidney measuring 16 mm. Solid  mass not excluded. This has been seen on prior CT scan and MRI and  has been considered suspicious for renal cell carcinoma.     Total Time: 50 minutes  Greater than 50%  of this time was spent counseling and coordinating care related to the above assessment and plan.  Doran Clay D.O. Palliative Medicine Team at  [facility]  Pager: [phone] Team Phone: [phone]              ;

## 2014-09-06 NOTE — Progress Notes (Signed)
Patient had two soft black stools. MD notified, new orders placed. BP 196/81, IV PRN given, BP 191/83, MD notified. Scheduled medications given, BP 165/81. Will continue to monitor closely.

## 2014-09-06 NOTE — Progress Notes (Signed)
Patient Demographics  Lindsay Sheppard, is a 81 y.o. female, DOB - Nov 24, 1920, VQQ:[id number]  Admit date - 09/04/2014   Admitting Physician Rise Patience, MD  Outpatient Primary MD for the patient is [name], MD  LOS - 2   Chief Complaint  Patient presents with  . Flank Pain      Admission history of present illness/brief narrative:  81 year old female history of chronic atrial fibrillation on warfarin, type 2 diabetes, chronic kidney disease that was admitted for left flank plain , was found to have UTI, started on IV Zosyn ,and found to have a right colonic mass as well as renal mass on CT of the abdomen and pelvis. - Patient was seen by surgery, gastroenterology, urology, and family are considering no colonoscopy or surgical intervention for further workup regarding her colon mass.   Subjective:   Lindsay Sheppard today has, No headache, No chest pain, No abdominal pain - No Nausea, No new weakness tingling or numbness, No Cough - SOB.   Assessment & Plan    Principal Problem:   Abdominal pain Active Problems:   CKD (chronic kidney disease) 3-4   Accelerated hypertension   Chronic atrial fibrillation   Diabetes mellitus type 2, controlled   Urinary tract infection   Colonic mass   UTI (lower urinary tract infection)  Abdominal pain/left flank pain with left renal mass/UTI -Patient currently being treated for UTI Zosyn was transitioned to Rocephin 1/6. -Patient has some urinary retention, Foley catheter inserted. -Urology consult appreciated, no further workup for renal mass side yearly ultrasound.  Right colonic mass -General surgery and GI consults appreciated - Discussed with one of the daughters today, and family seem to lean toward no aggressive workup, no colonoscopy or surgical intervention, but family still need to discuss among themselves  and reach final decision. - Consulted palliative care.  Chronic atrial fibrillation -Currently rate controlled -INR 2.25 - We'll resume warfarin if no intervention planned.  Diabetes mellitus, type II -Continue insulin sliding scale CBG monitoring  Code Status: Full  Family Communication: Daughter at bedside  Disposition Plan: PT consulted, family plan to take patient home, will need home care.   Procedures None   Consults   Urology Gastroenterology Gen. surgery   Medications  Scheduled Meds: . acidophilus  2 capsule Oral Daily  . cefTRIAXone (ROCEPHIN)  IV  1 g Intravenous Q24H  . cloNIDine  0.3 mg Transdermal Weekly  . cyanocobalamin  1,000 mcg Subcutaneous Q30 days  . donepezil  10 mg Oral QHS  . feeding supplement (GLUCERNA SHAKE)  237 mL Oral Daily  . ferrous sulfate  325 mg Oral BID WC  . fluticasone  2 spray Each Nare Daily  . insulin aspart  0-9 Units Subcutaneous TID WC  . insulin glargine  15 Units Subcutaneous Daily  . labetalol  400 mg Oral BID  . pantoprazole  40 mg Oral Daily  . pravastatin  20 mg Oral QPM  . Warfarin - Pharmacist Dosing Inpatient   Does not apply q1800   Continuous Infusions: . sodium chloride     PRN Meds:.acetaminophen **OR** acetaminophen, labetalol, morphine injection, ondansetron **OR** ondansetron (ZOFRAN) IV  DVT Prophylaxis  on warfarin.  Lab Results  Component Value Date  PLT 172 09/05/2014    Antibiotics    Anti-infectives    Start     Dose/Rate Route Frequency Ordered Stop   09/06/14 1100  cefTRIAXone (ROCEPHIN) 1 g in dextrose 5 % 50 mL IVPB - Premix     1 g100 mL/hr over 30 Minutes Intravenous Every 24 hours 09/06/14 1057     09/05/14 0200  piperacillin-tazobactam (ZOSYN) IVPB 3.375 g  Status:  Discontinued     3.375 g12.5 mL/hr over 240 Minutes Intravenous Every 8 hours 09/05/14 0156 09/06/14 1057   09/04/14 2245  cefTRIAXone (ROCEPHIN) 1 g in dextrose 5 % 50 mL IVPB     1 g100 mL/hr over 30 Minutes  Intravenous  Once 09/04/14 2234 09/04/14 2324          Objective:   Filed Vitals:   09/06/14 0808 09/06/14 0820 09/06/14 0903 09/06/14 1028  BP: 217/93 196/81 191/83 165/81  Pulse: 79 83 88 74  Temp: 97.7 F (36.5 C)   97.7 F (36.5 C)  TempSrc: Oral   Oral  Resp: 21 20  20   Height:      Weight:      SpO2: 100% 100%  100%    Wt Readings from Last 3 Encounters:  09/05/14 51.755 kg (114 lb 1.6 oz)  08/28/14 52.617 kg (116 lb)  08/08/14 52.164 kg (115 lb)     Intake/Output Summary (Last 24 hours) at 09/06/14 1058 Last data filed at 09/06/14 0400  Gross per 24 hour  Intake    810 ml  Output   1800 ml  Net   -990 ml     Physical Exam  Awake Alert, Oriented , No new F.N deficits, Normal affect [place].AT,PERRAL Supple Neck,No JVD, No cervical lymphadenopathy appriciated.  Symmetrical Chest wall movement, Good air movement bilaterally, CTAB RRR,No Gallops,Rubs or new Murmurs, No Parasternal Heave +ve B.Sounds, Abd Soft, No tenderness, No organomegaly appriciated, No rebound - guarding or rigidity. No Cyanosis, Clubbing or edema, No new Rash or bruise     Data Review   Micro Results Recent Results (from the past 240 hour(s))  Urine culture     Status: None   Collection Time: 08/27/14  1:28 PM  Result Value Ref Range Status   Specimen Description URINE, CATHETERIZED  Final   Special Requests NONE  Final   Colony Count   Final    >=100,000 COLONIES/ML Performed at [facility]   Final    ESCHERICHIA COLI Performed at [facility]    Report Status 08/29/2014 FINAL  Final   Organism ID, Bacteria ESCHERICHIA COLI  Final      Susceptibility   Escherichia coli - MIC*    AMPICILLIN >=32 RESISTANT Resistant     CEFAZOLIN <=4 SENSITIVE Sensitive     CEFTRIAXONE <=1 SENSITIVE Sensitive     CIPROFLOXACIN <=0.25 SENSITIVE Sensitive     GENTAMICIN <=1 SENSITIVE Sensitive     LEVOFLOXACIN <=0.12 SENSITIVE Sensitive     NITROFURANTOIN <=16  SENSITIVE Sensitive     TOBRAMYCIN <=1 SENSITIVE Sensitive     TRIMETH/SULFA <=20 SENSITIVE Sensitive     PIP/TAZO <=4 SENSITIVE Sensitive     * ESCHERICHIA COLI  Urine culture     Status: None   Collection Time: 09/04/14  6:17 PM  Result Value Ref Range Status   Specimen Description URINE, CLEAN CATCH  Final   Special Requests Immunocompromised  Final   Colony Count NO GROWTH Performed at [facility]  Final   Culture NO GROWTH Performed at [facility]   Final   Report Status 09/06/2014 FINAL  Final    Radiology Reports Dg Chest 2 View  09/04/2014   CLINICAL DATA:  Chest rales.  EXAM: CHEST  2 VIEW  COMPARISON:  Radiograph 08/27/2014  FINDINGS: Patient is rotated to the right. Diminished hyperinflation from prior. Cardiomediastinal contours are normal and unchanged. Atherosclerosis noted of the aortic arch. Pulmonary vasculature is normal. Minimal atelectasis or scarring in the left midlung zone. Decreased right basilar atelectasis. No confluent airspace disease. No pleural effusion or pneumothorax. The bones appear under mineralized. Compression deformities again noted in the thoracic spine  IMPRESSION: No acute pulmonary process.   Electronically Signed   By: Jeb Levering M.D.   On: 09/04/2014 19:02   Ct Abdomen Pelvis W Contrast  09/04/2014   CLINICAL DATA:  Lower abdomen and left flank pain. Recently treated for a urinary tract infection. Previous appendectomy and hysterectomy.  EXAM: CT ABDOMEN AND PELVIS WITH CONTRAST  TECHNIQUE: Multidetector CT imaging of the abdomen and pelvis was performed using the standard protocol following bolus administration of intravenous contrast.  CONTRAST:  169mL OMNIPAQUE IOHEXOL 300 MG/ML  SOLN  COMPARISON:  Pelvis CT dated 06/23/2008 and abdomen CT dated 01/28/2008.  FINDINGS: The previously demonstrated 2.9 cm fat containing left adrenal mass currently measures 2.8 x 1.6 cm on image 19. There is a large mass in the right colon  extending superior and inferior to the ileocecal valve. On coronal image number 58, this measures 5.8 x 4.6 cm. On axial image number 41, this measures 4.2 cm in AP diameter.  Increased prominence of bilateral extrarenal pelves with interval distention of the urinary bladder. Mild inferior bladder wall thickening as well as partial vaginal prolapse of the bladder. No urinary tract calculi are seen.  Mild diffuse low density of the liver relative to the spleen. 1.2 x 0.7 cm polyp or non calcified gallstone in the gallbladder fundus on image number 29. Unremarkable spleen, pancreas and right adrenal gland. The previously demonstrated 1.2 x 1.0 cm mid left renal mass suspicious for a primary renal neoplasm is slightly larger, currently measuring 1.6 x 1.3 cm on image number 27. This measures 72 Hounsfield units in density.  Old, healed bilateral pubic fractures. Diffuse osteopenia. Multiple old lumbar and lower thoracic vertebral compression fractures. Some of these are associated with bony retropulsion. This is most pronounced at the upper L2 level, with moderate canal stenosis. No acute fracture lines are seen. Atheromatous arterial calcifications. Clear lung bases.  IMPRESSION: 1. 5.8 x 4.6 x 4.2 cm polypoid right colon mass extending superior and inferior to the ileocecal valve. This is concerning for a primary colon neoplasm. 2. Mild increase in size of the previously demonstrated left renal mass suspicious for a renal cell carcinoma on a previous MR. 3. Prolapse of the urinary bladder into the upper vagina. 4. Mild inferior bladder wall thickening. This could be due to plaque-like tumor or reactive changes due to chronic prolapse into the vagina. 5. Distention of the urinary bladder with associated mild bilateral hydronephrosis. 6. 1.2 cm arm polyp or noncalcified gallstone in the gallbladder without evidence of cholecystitis. 7. Stable left adrenal myelolipoma. 8. Diffuse osteopenia with old vertebral  compression fractures and old, healed bilateral pubic fractures. 9. Mild diffuse hepatic steatosis.   Electronically Signed   By: Enrique Sack M.D.   On: 09/04/2014 21:36   US Renal  09/05/2014   CLINICAL DATA:  Elevated BUN and creatinine in, concern for hydronephrosis, history of renal stones and diabetes  EXAM: RENAL/URINARY TRACT ULTRASOUND COMPLETE  COMPARISON:  09/04/2014 CT scan  FINDINGS: Right Kidney:  Length: 8.6 cm. Normal echogenicity. Cortical thinning. 11 mm upper pole cyst. No hydronephrosis.  Left Kidney:  Length: 8.9 cm. Normal echogenicity with cortical thinning. As seen on CT scan, there is an oval lesion upper pole to midpole left kidney measuring 16 x 12 x 14 mm. It is hypoechoic with evidence of some vascularity in the wall.  Bladder:  Appears normal for degree of bladder distention.  IMPRESSION: Bilateral atrophy.  No hydronephrosis.  Indeterminate lesion upper pole left kidney measuring 16 mm. Solid mass not excluded. This has been seen on prior CT scan and MRI and has been considered suspicious for renal cell carcinoma.   Electronically Signed   By: Skipper Cliche M.D.   On: 09/05/2014 08:36    CBC  Recent Labs Lab 09/04/14 1832 09/05/14 0406  WBC 4.5 4.9  HGB 12.5 12.5  HCT 39.1 38.4  PLT 163 172  MCV 89.3 89.1  MCH 28.5 29.0  MCHC 32.0 32.6  RDW 15.4 15.5  LYMPHSABS 0.7 0.8  MONOABS 0.5 0.5  EOSABS 0.1 0.1  BASOSABS 0.0 0.0    Chemistries   Recent Labs Lab 09/04/14 1832 09/05/14 0406  NA 137 139  K 4.3 4.0  CL 104 102  CO2 26 30  GLUCOSE 179* 89  BUN 29* 21  CREATININE 1.11* 1.02  CALCIUM 9.5 9.6  AST 22 23  ALT 19 22  ALKPHOS 62 59  BILITOT 0.3 0.4   ------------------------------------------------------------------------------------------------------------------ estimated creatinine clearance is 29.4 mL/min (by C-G formula based on Cr of  1.02). ------------------------------------------------------------------------------------------------------------------ No results for input(s): HGBA1C in the last 72 hours. ------------------------------------------------------------------------------------------------------------------ No results for input(s): CHOL, HDL, LDLCALC, TRIG, CHOLHDL, LDLDIRECT in the last 72 hours. ------------------------------------------------------------------------------------------------------------------ No results for input(s): TSH, T4TOTAL, T3FREE, THYROIDAB in the last 72 hours.  Invalid input(s): FREET3 ------------------------------------------------------------------------------------------------------------------ No results for input(s): VITAMINB12, FOLATE, FERRITIN, TIBC, IRON, RETICCTPCT in the last 72 hours.  Coagulation profile  Recent Labs Lab 09/04/14 2100 09/06/14 0507  INR 2.25* 2.16*    No results for input(s): DDIMER in the last 72 hours.  Cardiac Enzymes  Recent Labs Lab 09/04/14 1832  TROPONINI <0.03   ------------------------------------------------------------------------------------------------------------------ Invalid input(s): POCBNP     Time Spent in minutes   30 minutes   ELGERGAWY, DAWOOD M.D on 09/06/2014 at 10:58 AM  Between 7am to 7pm - Pager - [phone]  After 7pm go to [email or web address] - password [email or web address]  And look for the night coverage person covering for me after hours  [facility] Office  [phone]   **Disclaimer: This note may have been dictated with voice recognition software. Similar sounding words can inadvertently be transcribed and this note may contain transcription errors which may not have been corrected upon publication of note.**

## 2014-09-06 NOTE — Progress Notes (Signed)
ANTICOAGULATION CONSULT NOTE - Follow-up Consult  Pharmacy Consult for Coumadin Indication: Afib  Allergies  Allergen Reactions  . Amiodarone Other (See Comments)    REACTION: f? fluid retention  . Deltasone [Prednisone] Other (See Comments)    unknown  . Diltiazem Hcl Other (See Comments)    unknown  . Levofloxacin Nausea And Vomiting    REACTION: nausea  . Pregabalin Other (See Comments)    REACTION: hallucinations  . Spironolactone Other (See Comments)    REACTION: elevated K at lowest dose  . Tequin [Gatifloxacin] Other (See Comments)    Hypoglycemia  . Diazepam Anxiety and Other (See Comments)    REACTION: agitation    Patient Measurements: Height: 5' 6.5" (168.9 cm) Weight: 114 lb 1.6 oz (51.755 kg) IBW/kg (Calculated) : 60.45 Heparin Dosing Weight: 52 kg   Vital Signs: Temp: 97.7 F (36.5 C) (01/06 1028) Temp Source: Oral (01/06 1028) BP: 165/81 mmHg (01/06 1028) Pulse Rate: 74 (01/06 1028)  Labs:  Recent Labs  09/04/14 1832 09/04/14 2100 09/05/14 0406 09/06/14 0507 09/06/14 1147  HGB 12.5  --  12.5  --  12.5  HCT 39.1  --  38.4  --  39.5  PLT 163  --  172  --  183  LABPROT  --  25.0*  --  24.3*  --   INR  --  2.25*  --  2.16*  --   CREATININE 1.11*  --  1.02  --   --   TROPONINI <0.03  --   --   --   --     Estimated Creatinine Clearance: 29.4 mL/min (by C-G formula based on Cr of 1.02).   Medical History: Past Medical History  Diagnosis Date  . POSTHERPETIC NEURALGIA 11/02/2009  . B12 DEFICIENCY 05/23/2007  . HYPERLIPIDEMIA 05/23/2007  . HYPERKALEMIA 05/30/2010  . ANEMIA-IRON DEFICIENCY 01/26/2007  . BLEPHARITIS, BILATERAL 02/23/2008  . Other specified forms of hearing loss 05/30/2010  . HYPERTENSION 01/26/2007  . Atrial fibrillation 05/23/2007  . DIASTOLIC HEART FAILURE, CHRONIC 12/14/2009  . POSTURAL HYPOTENSION 02/07/2008  . ALLERGIC RHINITIS 11/02/2009  . GERD 01/26/2007  . CONSTIPATION 01/25/2008  . SKIN LESION 05/30/2010  . BACK PAIN 08/02/2007   . OSTEOPOROSIS 05/23/2007  . SYNCOPE 02/07/2008  . FATIGUE 09/06/2007  . Dysuria 09/05/2008  . Abdominal pain, epigastric 09/06/2007  . EPIGASTRIC TENDERNESS 10/12/2007  . PULMONARY EMBOLISM, HX OF 05/23/2007  . SHINGLES, HX OF 05/23/2007  . 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 Left foot pain   . Diabetic neuropathy   . RENAL INSUFFICIENCY 07/02/2009  . RENAL CYST, LEFT 09/06/2007  . Kidney stones     "never had OR"  . Family history of anesthesia complication     "daughter has PONV; another daughter a little anesthesia lasts way too long"  . CHF (congestive heart failure) 2011  . Myocardial infarction 2011    "mild"  . DVT (deep venous thrombosis) ~ 1964    "think it was in the left"  . Aspiration pneumonia ~ 2006    "due to aspiration"  . DIABETES MELLITUS, TYPE II 09/06/2007  . DEPRESSIVE DISORDER 01/25/2008    "@ times; never treated for it"  . Melanoma of nose   . Compression fracture of lumbar vertebra     hx  . Compression fracture of thoracic vertebra 02/07/2014    "fell this am" (02/07/2014  . Arthritis     "hands" (03/15/2014)    Medications:  Prescriptions prior to admission  Medication Sig Dispense  Refill Last Dose  . acetaminophen (TYLENOL) 325 MG tablet Take 2 tablets (650 mg total) by mouth 3 (three) times daily.   09/04/2014 at 330p  . cefdinir (OMNICEF) 300 MG capsule Take 1 capsule (300 mg total) by mouth 2 (two) times daily. 10 capsule 0 09/03/2014 at Unknown time  . cloNIDine (CATAPRES - DOSED IN MG/24 HR) 0.3 mg/24hr patch Place 0.3 mg onto the skin once a week.   09/04/2014 at Unknown time  . CRANBERRY PO Take 500 mg by mouth daily.    09/03/2014 at Unknown time  . donepezil (ARICEPT) 10 MG tablet Take 1 tablet (10 mg total) by mouth at bedtime. 90 tablet 3 09/03/2014 at Unknown time  . feeding supplement, GLUCERNA SHAKE, (GLUCERNA SHAKE) LIQD Take 237 mLs by mouth 2 (two) times daily between meals. (Patient taking differently: Take 237 mLs by mouth daily. )  0 Past Week at Unknown  time  . ferrous sulfate 325 (65 FE) MG tablet Take 1 tablet (325 mg total) by mouth 2 (two) times daily with a meal. 60 tablet 1 09/04/2014 at Unknown time  . fluticasone (FLONASE) 50 MCG/ACT nasal spray Place 2 sprays into the nose daily. 16 g 2 09/04/2014 at Unknown time  . Hypromellose (ARTIFICIAL TEARS OP) Place 1 drop into both eyes daily.   09/04/2014 at Unknown time  . insulin glargine (LANTUS) 100 UNIT/ML injection Inject 23 Units into the skin daily.    09/04/2014 at Unknown time  . labetalol (NORMODYNE) 200 MG tablet Take 400 mg by mouth 2 (two) times daily.   09/04/2014 at 8a  . pantoprazole (PROTONIX) 40 MG tablet Take 1 tablet (40 mg total) by mouth daily. 90 tablet 3 09/04/2014 at Unknown time  . polyethylene glycol (MIRALAX / GLYCOLAX) packet Take 17 g by mouth 2 (two) times daily. 14 each 0 Past Month at Unknown time  . pravastatin (PRAVACHOL) 40 MG tablet Take 20 mg by mouth every evening.    09/03/2014 at Unknown time  . warfarin (COUMADIN) 2 MG tablet Follow instructions per coumadin clinic (Patient taking differently: Take 2-3 mg by mouth daily. Take 3 mg daily EXCEPT 2mg  on MWF) 90 tablet 0 09/03/2014 at 9pm  . cyanocobalamin (,VITAMIN B-12,) 1000 MCG/ML injection INJECT 1 ML (1000 MCG) INTO THE MUSCLE EVERY 30 DAYS. 1 mL 11 07-29-14    Assessment: Lindsay Sheppard with h/o Afib. Pharmacy consulted to resume Coumadin for AFib. INR on admission is therapeutic at 2.25 >2.16. Per med rec, patient's last dose of Coumadin was on 1/3. H/H and Plt wnl.  Home Coumadin Dose: 3 mg daily except 2 mg on MWF.   *Note -Coumadin was held last night pending possible intervention. For now no intervention planned. Discussed case with Dr. Waldron Labs and stated to go ahead and resume Coumadin at this time. If plans change, Dr. Waldron Labs will address.   Goal of Therapy:  INR 2-3 Monitor platelets by anticoagulation protocol: Yes   Plan:  Coumadin 3 mg tonight (due to missing dose yesterday) Monitor daily PT/INR, CBC,  cultures and patient's clinical progress   Sloan Leiter, PharmD, BCPS Clinical Pharmacist [phone] 09/06/2014, 2:21 PM

## 2014-09-06 NOTE — Progress Notes (Signed)
Patient ID: Lindsay Sheppard, female   DOB: 04-29-1921, 81 y.o.   MRN: [id number]    Subjective: Pt says she doesn't feel great today, but no specific complaints  Objective: Vital signs in last 24 hours: Temp:  [97.5 F (36.4 C)-98 F (36.7 C)] 97.7 F (36.5 C) (01/06 0808) Pulse Rate:  [73-94] 88 (01/06 0903) Resp:  [20-21] 20 (01/06 0820) BP: (121-217)/(70-103) 191/83 mmHg (01/06 0903) SpO2:  [97 %-100 %] 100 % (01/06 0820) Last BM Date: 09/05/14  Intake/Output from previous day: 01/05 0701 - 01/06 0700 In: 1210 [P.O.:1210] Out: 2050 [Urine:2050] Intake/Output this shift:    PE: Abd: soft, NT, Nd, +BS  Lab Results:   Recent Labs  09/04/14 1832 09/05/14 0406  WBC 4.5 4.9  HGB 12.5 12.5  HCT 39.1 38.4  PLT 163 172   BMET  Recent Labs  09/04/14 1832 09/05/14 0406  NA 137 139  K 4.3 4.0  CL 104 102  CO2 26 30  GLUCOSE 179* 89  BUN 29* 21  CREATININE 1.11* 1.02  CALCIUM 9.5 9.6   PT/INR  Recent Labs  09/04/14 2100 09/06/14 0507  LABPROT 25.0* 24.3*  INR 2.25* 2.16*   CMP     Component Value Date/Time   NA 139 09/05/2014 0406   K 4.0 09/05/2014 0406   CL 102 09/05/2014 0406   CO2 30 09/05/2014 0406   GLUCOSE 89 09/05/2014 0406   BUN 21 09/05/2014 0406   CREATININE 1.02 09/05/2014 0406   CALCIUM 9.6 09/05/2014 0406   PROT 5.4* 09/05/2014 0406   ALBUMIN 3.2* 09/05/2014 0406   AST 23 09/05/2014 0406   ALT 22 09/05/2014 0406   ALKPHOS 59 09/05/2014 0406   BILITOT 0.4 09/05/2014 0406   GFRNONAA 47* 09/05/2014 0406   GFRAA 54* 09/05/2014 0406   Lipase     Component Value Date/Time   LIPASE 29 10/23/2009 0301       Studies/Results: Dg Chest 2 View  09/04/2014   CLINICAL DATA:  Chest rales.  EXAM: CHEST  2 VIEW  COMPARISON:  Radiograph 08/27/2014  FINDINGS: Patient is rotated to the right. Diminished hyperinflation from prior. Cardiomediastinal contours are normal and unchanged. Atherosclerosis noted of the aortic arch. Pulmonary  vasculature is normal. Minimal atelectasis or scarring in the left midlung zone. Decreased right basilar atelectasis. No confluent airspace disease. No pleural effusion or pneumothorax. The bones appear under mineralized. Compression deformities again noted in the thoracic spine  IMPRESSION: No acute pulmonary process.   Electronically Signed   By: Jeb Levering M.D.   On: 09/04/2014 19:02   Ct Abdomen Pelvis W Contrast  09/04/2014   CLINICAL DATA:  Lower abdomen and left flank pain. Recently treated for a urinary tract infection. Previous appendectomy and hysterectomy.  EXAM: CT ABDOMEN AND PELVIS WITH CONTRAST  TECHNIQUE: Multidetector CT imaging of the abdomen and pelvis was performed using the standard protocol following bolus administration of intravenous contrast.  CONTRAST:  196mL OMNIPAQUE IOHEXOL 300 MG/ML  SOLN  COMPARISON:  Pelvis CT dated 06/23/2008 and abdomen CT dated 01/28/2008.  FINDINGS: The previously demonstrated 2.9 cm fat containing left adrenal mass currently measures 2.8 x 1.6 cm on image 19. There is a large mass in the right colon extending superior and inferior to the ileocecal valve. On coronal image number 58, this measures 5.8 x 4.6 cm. On axial image number 41, this measures 4.2 cm in AP diameter.  Increased prominence of bilateral extrarenal pelves with interval distention of the urinary  bladder. Mild inferior bladder wall thickening as well as partial vaginal prolapse of the bladder. No urinary tract calculi are seen.  Mild diffuse low density of the liver relative to the spleen. 1.2 x 0.7 cm polyp or non calcified gallstone in the gallbladder fundus on image number 29. Unremarkable spleen, pancreas and right adrenal gland. The previously demonstrated 1.2 x 1.0 cm mid left renal mass suspicious for a primary renal neoplasm is slightly larger, currently measuring 1.6 x 1.3 cm on image number 27. This measures 72 Hounsfield units in density.  Old, healed bilateral pubic fractures.  Diffuse osteopenia. Multiple old lumbar and lower thoracic vertebral compression fractures. Some of these are associated with bony retropulsion. This is most pronounced at the upper L2 level, with moderate canal stenosis. No acute fracture lines are seen. Atheromatous arterial calcifications. Clear lung bases.  IMPRESSION: 1. 5.8 x 4.6 x 4.2 cm polypoid right colon mass extending superior and inferior to the ileocecal valve. This is concerning for a primary colon neoplasm. 2. Mild increase in size of the previously demonstrated left renal mass suspicious for a renal cell carcinoma on a previous MR. 3. Prolapse of the urinary bladder into the upper vagina. 4. Mild inferior bladder wall thickening. This could be due to plaque-like tumor or reactive changes due to chronic prolapse into the vagina. 5. Distention of the urinary bladder with associated mild bilateral hydronephrosis. 6. 1.2 cm arm polyp or noncalcified gallstone in the gallbladder without evidence of cholecystitis. 7. Stable left adrenal myelolipoma. 8. Diffuse osteopenia with old vertebral compression fractures and old, healed bilateral pubic fractures. 9. Mild diffuse hepatic steatosis.   Electronically Signed   By: Enrique Sack M.D.   On: 09/04/2014 21:36   US Renal  09/05/2014   CLINICAL DATA:  Elevated BUN and creatinine in, concern for hydronephrosis, history of renal stones and diabetes  EXAM: RENAL/URINARY TRACT ULTRASOUND COMPLETE  COMPARISON:  09/04/2014 CT scan  FINDINGS: Right Kidney:  Length: 8.6 cm. Normal echogenicity. Cortical thinning. 11 mm upper pole cyst. No hydronephrosis.  Left Kidney:  Length: 8.9 cm. Normal echogenicity with cortical thinning. As seen on CT scan, there is an oval lesion upper pole to midpole left kidney measuring 16 x 12 x 14 mm. It is hypoechoic with evidence of some vascularity in the wall.  Bladder:  Appears normal for degree of bladder distention.  IMPRESSION: Bilateral atrophy.  No hydronephrosis.   Indeterminate lesion upper pole left kidney measuring 16 mm. Solid mass not excluded. This has been seen on prior CT scan and MRI and has been considered suspicious for renal cell carcinoma.   Electronically Signed   By: Skipper Cliche M.D.   On: 09/05/2014 08:36    Anti-infectives: Anti-infectives    Start     Dose/Rate Route Frequency Ordered Stop   09/05/14 0200  piperacillin-tazobactam (ZOSYN) IVPB 3.375 g     3.375 g12.5 mL/hr over 240 Minutes Intravenous Every 8 hours 09/05/14 0156     09/04/14 2245  cefTRIAXone (ROCEPHIN) 1 g in dextrose 5 % 50 mL IVPB     1 g100 mL/hr over 30 Minutes Intravenous  Once 09/04/14 2234 09/04/14 2324       Assessment/Plan  1. HTN 2. Incidental find of renal and colon mass  3. A fib on coumadin  Plan: 1. The patient's family has essentially decided they would not like any type of surgical intervention and are discussing whether they would even want to proceed with a colonoscopy.  The  initial impression I got from the 4 siblings that are present, is no.  They had a lot of questions about surgery and how that would affect the patient and her quality of life. These were all answered.   2. Given the family does not want to pursue surgical intervention, there is nothing further to offer from a surgical standpoint.  We will sign off.  Please call us back if needed.  LOS: 2 days    Susen Haskew E 09/06/2014, 10:17 AM Pager: [phone]

## 2014-09-07 DIAGNOSIS — N390 Urinary tract infection, site not specified: Secondary | ICD-10-CM | POA: Insufficient documentation

## 2014-09-07 LAB — BASIC METABOLIC PANEL
Anion gap: 5 (ref 5–15)
BUN: 20 mg/dL (ref 6–23)
CO2: 26 mmol/L (ref 19–32)
Calcium: 9.1 mg/dL (ref 8.4–10.5)
Chloride: 104 mEq/L (ref 96–112)
Creatinine, Ser: 1.11 mg/dL — ABNORMAL HIGH (ref 0.50–1.10)
GFR calc non Af Amer: 42 mL/min — ABNORMAL LOW (ref >90)
GFR, EST AFRICAN AMERICAN: 49 mL/min — AB (ref >90)
GLUCOSE: 87 mg/dL (ref 70–99)
POTASSIUM: 4 mmol/L (ref 3.5–5.1)
SODIUM: 135 mmol/L (ref 135–145)

## 2014-09-07 LAB — PROTIME-INR
INR: 2.23 — AB (ref 0.00–1.49)
Prothrombin Time: 24.9 seconds — ABNORMAL HIGH (ref 11.6–15.2)

## 2014-09-07 LAB — GLUCOSE, CAPILLARY
Glucose-Capillary: 87 mg/dL (ref 70–99)
Glucose-Capillary: 233 mg/dL — ABNORMAL HIGH (ref 70–99)
Glucose-Capillary: 137 mg/dL — ABNORMAL HIGH (ref 70–99)

## 2014-09-07 MED ORDER — WARFARIN SODIUM 3 MG PO TABS
3.0000 mg | ORAL_TABLET | Freq: Once | ORAL | Status: AC
  Administered 2014-09-07: 3 mg via ORAL
  Filled 2014-09-07: qty 1

## 2014-09-07 MED ORDER — SODIUM CHLORIDE 0.9 % IV SOLN
INTRAVENOUS | Status: DC
  Administered 2014-09-07: 20:00:00 via INTRAVENOUS

## 2014-09-07 NOTE — Progress Notes (Signed)
Talked to patient's daughter for DCP; Daughter wants to wait until the patient works with physical therapy to decide whether or no to take her home with [facility] services vs home with hospice care; CM to follow up after patient works with physical therapy; patient is active with [facility] for HHRN/PTAneta Mins [phone]

## 2014-09-07 NOTE — Progress Notes (Signed)
Patient Demographics  Lindsay Sheppard, is a 81 y.o. female, DOB - 06/17/1921, SFK:[id number]  Admit date - 09/04/2014   Admitting Physician Rise Patience, MD  Outpatient Primary MD for the patient is [name], MD  LOS - 3   Chief Complaint  Patient presents with  . Flank Pain      Admission history of present illness/brief narrative:  81 year old female history of chronic atrial fibrillation on warfarin, type 2 diabetes, chronic kidney disease that was admitted for left flank plain , was found to have UTI, started on IV Zosyn ,and found to have a right colonic mass as well as renal mass on CT of the abdomen and pelvis. - Patient was seen by surgery, gastroenterology, urology, and family are considering no colonoscopy or surgical intervention for further workup regarding her colon mass.   Subjective:   Lindsay Sheppard today has, No headache, No chest pain, No abdominal pain - No Nausea, No new weakness tingling or numbness, No Cough - SOB.   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 -Patient has some urinary retention, Foley catheter inserted, will attempt voiding trial today. -Urology consult appreciated, no further workup for renal mass side yearly ultrasound.  Right colonic mass -General surgery and GI consults appreciated - Palliative care consult appreciated - At this point  family does not wish to pursue any further workup  Chronic atrial fibrillation -Currently rate controlled - Warfarin is being monitored and dosed by pharmacy.  Diabetes mellitus, type II -Continue insulin sliding scale CBG monitoring  Code Status: Full  Family Communication: Daughter at bedside  Disposition Plan: Home with home care versus home with hospice, plan for discharge in a.m.Marland Kitchen   Procedures None   Consults   Urology Gastroenterology Gen. surgery Palliative care  Medications  Scheduled Meds: . acidophilus  2 capsule Oral Daily  . cefTRIAXone (ROCEPHIN)  IV  1 g Intravenous Q24H  . cloNIDine  0.3 mg Transdermal Weekly  . cyanocobalamin  1,000 mcg Subcutaneous Q30 days  . donepezil  10 mg Oral QHS  . feeding supplement (GLUCERNA SHAKE)  237 mL Oral BID BM  . ferrous sulfate  325 mg Oral BID WC  . fluticasone  2 spray Each Nare Daily  . insulin aspart  0-9 Units Subcutaneous TID WC  . insulin glargine  15 Units Subcutaneous Daily  . labetalol  400 mg Oral BID  . pantoprazole  40 mg Oral Daily  . pravastatin  20 mg Oral QPM  . warfarin  3 mg Oral ONCE-1800  . Warfarin - Pharmacist Dosing Inpatient   Does not apply q1800   Continuous Infusions:   PRN Meds:.acetaminophen **OR** acetaminophen, labetalol, morphine injection, ondansetron **OR** ondansetron (ZOFRAN) IV  DVT Prophylaxis  on  warfarin.  Lab Results  Component Value Date   PLT 183 09/06/2014    Antibiotics    Anti-infectives    Start     Dose/Rate Route Frequency Ordered Stop   09/06/14 1100  cefTRIAXone (ROCEPHIN) 1 g in dextrose 5 % 50 mL IVPB - Premix     1 g100 mL/hr over 30 Minutes Intravenous Every 24 hours 09/06/14 1057     09/05/14 0200  piperacillin-tazobactam (ZOSYN) IVPB 3.375 g  Status:  Discontinued     3.375 g12.5 mL/hr over 240 Minutes Intravenous Every 8 hours 09/05/14 0156 09/06/14 1057   09/04/14 2245  cefTRIAXone (ROCEPHIN) 1 g in dextrose 5 % 50 mL IVPB     1 g100 mL/hr  over 30 Minutes Intravenous  Once 09/04/14 2234 09/04/14 2324          Objective:   Filed Vitals:   09/06/14 1428 09/06/14 2109 09/07/14 0149 09/07/14 0512  BP: 163/78 194/96 208/97 151/86  Pulse: 78 85 86 87  Temp: 98.1 F (36.7 C) 98.5 F (36.9 C) 98.6 F (37 C) 98.2 F (36.8 C)  TempSrc: Oral Oral Oral Oral  Resp: 20 20 20 18   Height:      Weight:      SpO2: 100% 99% 98% 98%    Wt Readings from Last 3 Encounters:  09/05/14 51.755 kg (114 lb 1.6 oz)  08/28/14 52.617 kg (116 lb)  08/08/14 52.164 kg (115 lb)     Intake/Output Summary (Last 24 hours) at 09/07/14 1142 Last data filed at 09/06/14 1810  Gross per 24 hour  Intake      0 ml  Output    300 ml  Net   -300 ml     Physical Exam  Awake Alert, Oriented , No new F.N deficits, Normal affect [place].AT,PERRAL Supple Neck,No JVD, No cervical lymphadenopathy appriciated.  Symmetrical Chest wall movement, Good air movement bilaterally, CTAB RRR,No Gallops,Rubs or new Murmurs, No Parasternal Heave +ve B.Sounds, Abd Soft, No tenderness, No organomegaly appriciated, No rebound - guarding or rigidity. No Cyanosis, Clubbing or edema, No new Rash or bruise     Data Review   Micro Results Recent Results (from the past 240 hour(s))  Urine culture     Status: None   Collection Time: 09/04/14  6:17 PM  Result Value Ref Range Status   Specimen Description URINE, CLEAN CATCH  Final   Special Requests Immunocompromised  Final   Colony Count NO GROWTH Performed at [facility]   Final   Culture NO GROWTH Performed at [facility]   Final   Report Status 09/06/2014 FINAL  Final    Radiology Reports No results found.  CBC  Recent Labs Lab 09/04/14 1832 09/05/14 0406 09/06/14 1147  WBC 4.5 4.9 5.3  HGB 12.5 12.5 12.5  HCT 39.1 38.4 39.5  PLT 163 172 183  MCV 89.3 89.1 86.8  MCH 28.5 29.0 27.5  MCHC 32.0 32.6 31.6  RDW 15.4 15.5 15.5  LYMPHSABS 0.7 0.8  --   MONOABS 0.5 0.5  --     EOSABS 0.1 0.1  --   BASOSABS 0.0 0.0  --     Chemistries   Recent Labs Lab 09/04/14 1832 09/05/14 0406 09/07/14 0600  NA 137 139 135  K 4.3 4.0 4.0  CL 104 102 104  CO2 26 30 26   GLUCOSE 179* 89 87  BUN 29* 21 20  CREATININE 1.11* 1.02 1.11*  CALCIUM 9.5 9.6 9.1  AST  22 23  --   ALT 19 22  --   ALKPHOS 62 59  --   BILITOT 0.3 0.4  --    ------------------------------------------------------------------------------------------------------------------ estimated creatinine clearance is 27 mL/min (by C-G formula based on Cr of 1.11). ------------------------------------------------------------------------------------------------------------------ No results for input(s): HGBA1C in the last 72 hours. ------------------------------------------------------------------------------------------------------------------ No results for input(s): CHOL, HDL, LDLCALC, TRIG, CHOLHDL, LDLDIRECT in the last 72 hours. ------------------------------------------------------------------------------------------------------------------ No results for input(s): TSH, T4TOTAL, T3FREE, THYROIDAB in the last 72 hours.  Invalid input(s): FREET3 ------------------------------------------------------------------------------------------------------------------ No results for input(s): VITAMINB12, FOLATE, FERRITIN, TIBC, IRON, RETICCTPCT in the last 72 hours.  Coagulation profile  Recent Labs Lab 09/04/14 2100 09/06/14 0507 09/07/14 0600  INR 2.25* 2.16* 2.23*    No results for input(s): DDIMER in the last 72 hours.  Cardiac Enzymes  Recent Labs Lab 09/04/14 1832  TROPONINI <0.03   ------------------------------------------------------------------------------------------------------------------ Invalid input(s): POCBNP     Time Spent in minutes   30 minutes   Gustie Bobb M.D on 09/07/2014 at 11:42 AM  Between 7am to 7pm - Pager - [phone]  After 7pm go to [email or web address] -  password [email or web address]  And look for the night coverage person covering for me after hours  [facility] Office  [phone]   **Disclaimer: This note may have been dictated with voice recognition software. Similar sounding words can inadvertently be transcribed and this note may contain transcription errors which may not have been corrected upon publication of note.**

## 2014-09-07 NOTE — Progress Notes (Signed)
ANTICOAGULATION CONSULT NOTE - Follow-up Consult  Pharmacy Consult for Coumadin Indication: Afib  Allergies  Allergen Reactions  . Amiodarone Other (See Comments)    REACTION: f? fluid retention  . Deltasone [Prednisone] Other (See Comments)    unknown  . Diltiazem Hcl Other (See Comments)    unknown  . Levofloxacin Nausea And Vomiting    REACTION: nausea  . Pregabalin Other (See Comments)    REACTION: hallucinations  . Spironolactone Other (See Comments)    REACTION: elevated K at lowest dose  . Tequin [Gatifloxacin] Other (See Comments)    Hypoglycemia  . Diazepam Anxiety and Other (See Comments)    REACTION: agitation    Patient Measurements: Height: 5' 6.5" (168.9 cm) Weight: 114 lb 1.6 oz (51.755 kg) IBW/kg (Calculated) : 60.45 Heparin Dosing Weight: 52 kg   Vital Signs: Temp: 98.2 F (36.8 C) (01/07 0512) Temp Source: Oral (01/07 0512) BP: 151/86 mmHg (01/07 0512) Pulse Rate: 87 (01/07 0512)  Labs:  Recent Labs  09/04/14 1832 09/04/14 2100 09/05/14 0406 09/06/14 0507 09/06/14 1147 09/07/14 0600  HGB 12.5  --  12.5  --  12.5  --   HCT 39.1  --  38.4  --  39.5  --   PLT 163  --  172  --  183  --   LABPROT  --  25.0*  --  24.3*  --  24.9*  INR  --  2.25*  --  2.16*  --  2.23*  CREATININE 1.11*  --  1.02  --   --  1.11*  TROPONINI <0.03  --   --   --   --   --     Estimated Creatinine Clearance: 27 mL/min (by C-G formula based on Cr of 1.11).   Medical History: Past Medical History  Diagnosis Date  . POSTHERPETIC NEURALGIA 11/02/2009  . B12 DEFICIENCY 05/23/2007  . HYPERLIPIDEMIA 05/23/2007  . HYPERKALEMIA 05/30/2010  . ANEMIA-IRON DEFICIENCY 01/26/2007  . BLEPHARITIS, BILATERAL 02/23/2008  . Other specified forms of hearing loss 05/30/2010  . HYPERTENSION 01/26/2007  . Atrial fibrillation 05/23/2007  . DIASTOLIC HEART FAILURE, CHRONIC 12/14/2009  . POSTURAL HYPOTENSION 02/07/2008  . ALLERGIC RHINITIS 11/02/2009  . GERD 01/26/2007  . CONSTIPATION  01/25/2008  . SKIN LESION 05/30/2010  . BACK PAIN 08/02/2007  . OSTEOPOROSIS 05/23/2007  . SYNCOPE 02/07/2008  . FATIGUE 09/06/2007  . Dysuria 09/05/2008  . Abdominal pain, epigastric 09/06/2007  . EPIGASTRIC TENDERNESS 10/12/2007  . PULMONARY EMBOLISM, HX OF 05/23/2007  . SHINGLES, HX OF 05/23/2007  . 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 Left foot pain   . Diabetic neuropathy   . RENAL INSUFFICIENCY 07/02/2009  . RENAL CYST, LEFT 09/06/2007  . Kidney stones     "never had OR"  . Family history of anesthesia complication     "daughter has PONV; another daughter a little anesthesia lasts way too long"  . CHF (congestive heart failure) 2011  . Myocardial infarction 2011    "mild"  . DVT (deep venous thrombosis) ~ 1964    "think it was in the left"  . Aspiration pneumonia ~ 2006    "due to aspiration"  . DIABETES MELLITUS, TYPE II 09/06/2007  . DEPRESSIVE DISORDER 01/25/2008    "@ times; never treated for it"  . Melanoma of nose   . Compression fracture of lumbar vertebra     hx  . Compression fracture of thoracic vertebra 02/07/2014    "fell this am" (02/07/2014  . Arthritis     "  hands" (03/15/2014)    Medications:  Prescriptions prior to admission  Medication Sig Dispense Refill Last Dose  . acetaminophen (TYLENOL) 325 MG tablet Take 2 tablets (650 mg total) by mouth 3 (three) times daily.   09/04/2014 at 330p  . cefdinir (OMNICEF) 300 MG capsule Take 1 capsule (300 mg total) by mouth 2 (two) times daily. 10 capsule 0 09/03/2014 at Unknown time  . cloNIDine (CATAPRES - DOSED IN MG/24 HR) 0.3 mg/24hr patch Place 0.3 mg onto the skin once a week.   09/04/2014 at Unknown time  . CRANBERRY PO Take 500 mg by mouth daily.    09/03/2014 at Unknown time  . donepezil (ARICEPT) 10 MG tablet Take 1 tablet (10 mg total) by mouth at bedtime. 90 tablet 3 09/03/2014 at Unknown time  . feeding supplement, GLUCERNA SHAKE, (GLUCERNA SHAKE) LIQD Take 237 mLs by mouth 2 (two) times daily between meals. (Patient taking  differently: Take 237 mLs by mouth daily. )  0 Past Week at Unknown time  . ferrous sulfate 325 (65 FE) MG tablet Take 1 tablet (325 mg total) by mouth 2 (two) times daily with a meal. 60 tablet 1 09/04/2014 at Unknown time  . fluticasone (FLONASE) 50 MCG/ACT nasal spray Place 2 sprays into the nose daily. 16 g 2 09/04/2014 at Unknown time  . Hypromellose (ARTIFICIAL TEARS OP) Place 1 drop into both eyes daily.   09/04/2014 at Unknown time  . insulin glargine (LANTUS) 100 UNIT/ML injection Inject 23 Units into the skin daily.    09/04/2014 at Unknown time  . labetalol (NORMODYNE) 200 MG tablet Take 400 mg by mouth 2 (two) times daily.   09/04/2014 at 8a  . pantoprazole (PROTONIX) 40 MG tablet Take 1 tablet (40 mg total) by mouth daily. 90 tablet 3 09/04/2014 at Unknown time  . polyethylene glycol (MIRALAX / GLYCOLAX) packet Take 17 g by mouth 2 (two) times daily. 14 each 0 Past Month at Unknown time  . pravastatin (PRAVACHOL) 40 MG tablet Take 20 mg by mouth every evening.    09/03/2014 at Unknown time  . warfarin (COUMADIN) 2 MG tablet Follow instructions per coumadin clinic (Patient taking differently: Take 2-3 mg by mouth daily. Take 3 mg daily EXCEPT 2mg  on MWF) 90 tablet 0 09/03/2014 at 9pm  . cyanocobalamin (,VITAMIN B-12,) 1000 MCG/ML injection INJECT 1 ML (1000 MCG) INTO THE MUSCLE EVERY 30 DAYS. 1 mL 11 07-29-14    Assessment: 33 YOF with h/o Afib. Pharmacy consulted to resume Coumadin for AFib. INR on admission is therapeutic at 2.25 >2.16>2.23. Per med rec, patient's last dose of Coumadin was on 1/3. H/H and Plt wnl.  Home Coumadin Dose: 3 mg daily except 2 mg on MWF.   *Note -Coumadin was held 1/5 pending possible intervention. For now, no intervention planned. Discussed case with Dr. Waldron Labs and stated to go ahead and resume Coumadin. If plans change, Dr. Waldron Labs will address.   Goal of Therapy:  INR 2-3 Monitor platelets by anticoagulation protocol: Yes   Plan:  Coumadin 3 mg tonight per  home regimen Likely ok to resume home regimen if to continue Coumadin therapy Monitor daily PT/INR, CBC, cultures and patient's clinical progress   Sloan Leiter, PharmD, BCPS Clinical Pharmacist [phone] 09/07/2014, 8:41 AM

## 2014-09-07 NOTE — Progress Notes (Signed)
Patient ID:Nillie A Enerson      DOB: 11/16/1920      MRN:[id number]   Palliative Medicine Team at [facility] Progress Note    Subjective: Sleeping comfortably this afternoon. Family notes episodic confusion which is typically worsened by her being in hospital.    Filed Vitals:   09/07/14 0512  BP: 151/86  Pulse: 87  Temp: 98.2 F (36.8 C)  Resp: 18   Physical exam: GEN: resting comfortably, NAD   CBC    Component Value Date/Time   WBC 5.3 09/06/2014 1147   RBC 4.55 09/06/2014 1147   HGB 12.5 09/06/2014 1147   HCT 39.5 09/06/2014 1147   PLT 183 09/06/2014 1147   MCV 86.8 09/06/2014 1147   MCH 27.5 09/06/2014 1147   MCHC 31.6 09/06/2014 1147   RDW 15.5 09/06/2014 1147   LYMPHSABS 0.8 09/05/2014 0406   MONOABS 0.5 09/05/2014 0406   EOSABS 0.1 09/05/2014 0406   BASOSABS 0.0 09/05/2014 0406    CMP     Component Value Date/Time   NA 135 09/07/2014 0600   K 4.0 09/07/2014 0600   CL 104 09/07/2014 0600   CO2 26 09/07/2014 0600   GLUCOSE 87 09/07/2014 0600   BUN 20 09/07/2014 0600   CREATININE 1.11* 09/07/2014 0600   CALCIUM 9.1 09/07/2014 0600   PROT 5.4* 09/05/2014 0406   ALBUMIN 3.2* 09/05/2014 0406   AST 23 09/05/2014 0406   ALT 22 09/05/2014 0406   ALKPHOS 59 09/05/2014 0406   BILITOT 0.4 09/05/2014 0406   GFRNONAA 42* 09/07/2014 0600   GFRAA 49* 09/07/2014 0600      Assessment and plan: 81 yo female with dementia, afib, who presented with left flank pain. Found to have colon mass. Family electing not to pursue further work-up. Palliative consulted for goals of care.   1. Code Status: Full. This was brought up in June by PCP, family still thinking about this. I think with other discussion today this is not current priority. They will continue to think about.   2. Goals of Care: Met with all 5 children today. Discussed overall understanding of situation with all of them.  They wish to get Haniah back home where she is cared for by her daughters  Bonnie and Sharon.  They [facility] prior to this hospitalization and a good relationship with nurse and therapist through them.  We talked about the potential to continue this vs getting home hospice care involved.  I discussed at length potential benefits of hospice care and answered all questions family had about this.  We also talked about potential risks/benefits of continued medications such as warfarin, statin, etc.  They wish to discuss these issues further as a family. I sense at this time, some family members are leaning towards hospice care while others are more aligned with continued home health and hospice care at a later date (they would setup through PCP Dr John).  They will let us know what their plans are after discussion today and possibly into tomorrow when discharge is potentially planned. If they elect to continue home health, I have recommended referral for outpatient palliative care to family which can often be arranged through local hospice agencies.  They understand that resources through palliative care will be much more limited than hospice care at home.  Ultimately, family agrees that they would like to try and avoid future needs for hospitalization and maximize her time at home.   3. Symptom Management:    1. Weight Loss- Family feels that this is more recent and may be related to infection. Will need to be monitored as outpatient.  Reportedly normally has good appetite but typical decrease in appetite and even weight when she gets hospitalized/UTI 2. Recurrent Flank Pain/UTI- prophylactic abx have been used in past and worked well. Concerns related to coumadin levels. Can be further discussed at discharge and may depend on what family decided about above goals.   4. Psychosocial/Spiritual: lives in [place]. 5 children. 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   Total time: 50 minutes  >50% of time spent in counseling and coordination  of care regarding above  Doran Clay D.O. Palliative Medicine Team at [facility]  Pager: [phone] Team Phone: [phone]

## 2014-09-07 NOTE — Progress Notes (Signed)
Foley catheter removed per order. Patient is now resting in bed.

## 2014-09-07 NOTE — Progress Notes (Signed)
MD paged regarding patient's blood pressure. 215/86 after PRN labetalol given. MD paged x2. Patient is resting quietly in room. Report given to night shift RN.

## 2014-09-07 NOTE — Clinical Documentation Improvement (Signed)
   Per 1/5 eval by Registered Dietician patient meets criteria for "underweight" with BMI 18.14 and 85% of ideal body weight. If you agree please add to documentation to clarify severity of illness and risk of mortality.  Thank You,  Barrie Dunker RN [facility] HIM department

## 2014-09-07 NOTE — Progress Notes (Signed)
Patient had an I&O cath per dr order after bladder scan reading 75 mL. 50 mL drained from bladder.

## 2014-09-07 NOTE — Progress Notes (Signed)
Chaplain initiated visit with pt and family. Pt ready to get home to her usual surroundings. Pt daughter, Ivin Booty, at bedside and expressed that this experience with her mom has "hit [place] in the face." Chaplain offered emotional support and offered to keep family in prayers. Pt needed to answer phone call. Chaplain will continue to follow. Should family need support before follow up page chaplain as needed.    09/07/14 1600  Clinical Encounter Type  Visited With Patient and family together  Visit Type Initial;Spiritual support  Recommendations Follow Up  Spiritual Encounters  Spiritual Needs Emotional;Prayer  Stress Factors  Family Stress Factors Loss  StameyBarbette Hair, Chaplain 09/07/2014 4:35 PM

## 2014-09-07 NOTE — Evaluation (Signed)
Physical Therapy Evaluation Patient Details Name: Lindsay Sheppard MRN: [id number] DOB: Oct 26, 1920 Today's Date: 09/07/2014   History of Present Illness  81 year old female history of chronic atrial fibrillation on warfarin, type 2 diabetes, chronic kidney disease that was admitted for left flank plain , was found to have UTI, started on IV Zosyn ,and found to have a right colonic mass as well as renal mass on CT of the abdomen and pelvis. Family not considering surgery or intervention regarding colon mass.   Clinical Impression  Pt adm due to above. Family provides 24/7 (A) at home and has recently been consulted by palliative. Per family, the goal is "to keep our mother as independent as prior to this admission". PTA pt was ambulating household distances with supervision to min guard while using RW. At this time pt is requiring mod (A) due to generalized weakness. Discussed with caregiver/daughter pt using SPT with (A) to wheelchair for mobility upon D/C home, to reduce risk of falls and daughter was very receptive. Pt to benefit from skilled acute PT to address deficits and maximize functional mobility prior to D/C home. Family hopes to take mother home and reduce hospitalizations in future. Educated caregiver on importance of mobility at home.     Follow Up Recommendations Home health PT;Supervision/Assistance - 24 hour    Equipment Recommendations  None recommended by PT    Recommendations for Other Services OT consult     Precautions / Restrictions Precautions Precautions: Fall Precaution Comments: frail skin Restrictions Weight Bearing Restrictions: No      Mobility  Bed Mobility Overal bed mobility: Needs Assistance Bed Mobility: Supine to Sit     Supine to sit: Min assist;HOB elevated     General bed mobility comments: handheld (A) to elevate trunk to sitting position due to generalized deconditioning  Transfers Overall transfer level: Needs assistance Equipment used:  Rolling walker (2 wheeled) Transfers: Sit to/from [facility] Sit to Stand: Mod assist Stand pivot transfers: Mod assist       General transfer comment: pt shaky and unsteady with standing due to overall weakness; use of gt belt to help pt balance and pivot to [facility]; daughter present and (A)   Ambulation/Gait Ambulation/Gait assistance: Mod assist Ambulation Distance (Feet): 8 Feet Assistive device: Rolling walker (2 wheeled) Gait Pattern/deviations: Step-through pattern;Decreased stride length;Shuffle;Narrow base of support Gait velocity: decreased Gait velocity interpretation: Below normal speed for age/gender General Gait Details: pt unsteady; daughter (A) with session; use of gt belt to balance and hand over hand (A) to manage RW   Stairs            Wheelchair Mobility    Modified Rankin (Stroke Patients Only)       Balance Overall balance assessment: Needs assistance Sitting-balance support: Feet supported;No upper extremity supported Sitting balance-Leahy Scale: Fair     Standing balance support: During functional activity;Bilateral upper extremity supported Standing balance-Leahy Scale: Poor Standing balance comment: unsteady and shaky with standing; requiring mod (A) to balance with 2nd person helpful to position chair                              Pertinent Vitals/Pain Pain Assessment: No/denies pain    Home Living Family/patient expects to be discharged to:: Private residence Living Arrangements: Children Available Help at Discharge: Family;Available 24 hours/day Type of Home: House Home Access: Stairs to enter Entrance Stairs-Rails: Right;Left;Can reach both Entrance Stairs-Number of Steps: 2 Home Layout:  Two level;Able to live on main level with bedroom/bathroom Home Equipment: Gilford Rile - 2 wheels;Wheelchair - [employer];Shower seat;Grab bars - tub/shower;Adaptive equipment      Prior Function Level of  Independence: Needs assistance   Gait / Transfers Assistance Needed: was ambulating supervision to min guard from bedroom to kitchen PTA with RW   ADL's / Homemaking Assistance Needed: assisted for bathing, dressing, but participates, family performs housekeeping, pt was helping with some cooking prior to last admission        Hand Dominance   Dominant Hand: Right    Extremity/Trunk Assessment   Upper Extremity Assessment: Defer to OT evaluation           Lower Extremity Assessment: Generalized weakness      Cervical / Trunk Assessment: Kyphotic  Communication   Communication: HOH  Cognition Arousal/Alertness: Awake/alert Behavior During Therapy: WFL for tasks assessed/performed Overall Cognitive Status: Impaired/Different from baseline Area of Impairment: Attention;Safety/judgement   Current Attention Level: Sustained     Safety/Judgement: Decreased awareness of safety     General Comments: follows commands with incr time; has been confused this admission but during session was alert and oriented     General Comments General comments (skin integrity, edema, etc.): discussed D/C recommendations with daughter/caregiver    Exercises General Exercises - Lower Extremity Ankle Circles/Pumps: AROM;Both;10 reps;Supine Long Arc Quad: AROM;Both;5 reps;Strengthening;Seated Heel Slides: AAROM;Both;Strengthening;5 reps;Supine      Assessment/Plan    PT Assessment Patient needs continued PT services  PT Diagnosis Difficulty walking;Generalized weakness;Altered mental status   PT Problem List Decreased strength;Decreased balance;Decreased mobility;Decreased cognition;Decreased safety awareness;Impaired sensation;Decreased activity tolerance  PT Treatment Interventions DME instruction;Gait training;Functional mobility training;Therapeutic activities;Stair training;Balance training;Therapeutic exercise;Neuromuscular re-education;Patient/family education   PT Goals (Current  goals can be found in the Care Plan section) Acute Rehab PT Goals Patient Stated Goal: per daughter "i just want to keep my mom as independent as i can"  PT Goal Formulation: With patient/family Time For Goal Achievement: 09/14/14 Potential to Achieve Goals: Fair    Frequency Min 3X/week   Barriers to discharge        Co-evaluation               End of Session Equipment Utilized During Treatment: Gait belt Activity Tolerance: Patient limited by fatigue Patient left: in chair;with call bell/phone within reach;with family/visitor present Nurse Communication: Mobility status;Precautions         Time: 9518-8416 PT Time Calculation (min) (ACUTE ONLY): 21 min   Charges:   PT Evaluation $Initial PT Evaluation Tier I: 1 Procedure PT Treatments $Gait Training: 8-22 mins   PT G CodesGustavus Bryant, [place]  [phone] 09/07/2014, 4:54 PM

## 2014-09-08 DIAGNOSIS — T8351XS Infection and inflammatory reaction due to indwelling urinary catheter, sequela: Secondary | ICD-10-CM

## 2014-09-08 LAB — GLUCOSE, CAPILLARY
GLUCOSE-CAPILLARY: 99 mg/dL (ref 70–99)
GLUCOSE-CAPILLARY: 164 mg/dL — AB (ref 70–99)

## 2014-09-08 LAB — PROTIME-INR
INR: 2.33 — ABNORMAL HIGH (ref 0.00–1.49)
PROTHROMBIN TIME: 25.8 s — AB (ref 11.6–15.2)

## 2014-09-08 MED ORDER — HYDRALAZINE HCL 20 MG/ML IJ SOLN
10.0000 mg | Freq: Once | INTRAMUSCULAR | Status: AC
  Administered 2014-09-08: 10 mg via INTRAVENOUS
  Filled 2014-09-08: qty 1

## 2014-09-08 MED ORDER — WARFARIN SODIUM 2 MG PO TABS
2.0000 mg | ORAL_TABLET | ORAL | Status: DC
  Filled 2014-09-08: qty 1

## 2014-09-08 MED ORDER — HYDRALAZINE HCL 50 MG PO TABS
50.0000 mg | ORAL_TABLET | Freq: Three times a day (TID) | ORAL | Status: DC
  Administered 2014-09-08 (×2): 50 mg via ORAL
  Filled 2014-09-08 (×2): qty 1

## 2014-09-08 MED ORDER — TAMSULOSIN HCL 0.4 MG PO CAPS: 0.4000 mg | ORAL_CAPSULE | Freq: Every day | ORAL | Status: DC

## 2014-09-08 MED ORDER — AMOXICILLIN-POT CLAVULANATE 875-125 MG PO TABS: 1.0000 | ORAL_TABLET | Freq: Two times a day (BID) | ORAL | Status: AC

## 2014-09-08 MED ORDER — URELLE 81 MG PO TABS
1.0000 | ORAL_TABLET | Freq: Once | ORAL | Status: AC
  Administered 2014-09-08: 81 mg via ORAL
  Filled 2014-09-08: qty 1

## 2014-09-08 MED ORDER — MORPHINE SULFATE 2 MG/ML IJ SOLN: 2.0000 mg | Freq: Once | INTRAMUSCULAR | Status: DC

## 2014-09-08 MED ORDER — HYDRALAZINE HCL 25 MG PO TABS: 25.0000 mg | ORAL_TABLET | Freq: Three times a day (TID) | ORAL | Status: DC

## 2014-09-08 MED ORDER — WARFARIN SODIUM 3 MG PO TABS: 3.0000 mg | ORAL_TABLET | ORAL | Status: DC

## 2014-09-08 MED ORDER — MORPHINE SULFATE 2 MG/ML IJ SOLN
2.0000 mg | Freq: Once | INTRAMUSCULAR | Status: AC
Start: 2014-09-08 — End: 2014-09-08
  Administered 2014-09-08: 2 mg via INTRAVENOUS
  Filled 2014-09-08: qty 1

## 2014-09-08 NOTE — Progress Notes (Signed)
Patient d/c home, d/c instruction given to caregiver, caregiver verbalized understanding. Condition stable.

## 2014-09-08 NOTE — Progress Notes (Signed)
ANTICOAGULATION CONSULT NOTE - Follow Up Consult  Pharmacy Consult for Coumadin Indication: atrial fibrillation  Allergies  Allergen Reactions  . Amiodarone Other (See Comments)    REACTION: f? fluid retention  . Deltasone [Prednisone] Other (See Comments)    unknown  . Diltiazem Hcl Other (See Comments)    unknown  . Levofloxacin Nausea And Vomiting    REACTION: nausea  . Pregabalin Other (See Comments)    REACTION: hallucinations  . Spironolactone Other (See Comments)    REACTION: elevated K at lowest dose  . Tequin [Gatifloxacin] Other (See Comments)    Hypoglycemia  . Diazepam Anxiety and Other (See Comments)    REACTION: agitation    Patient Measurements: Height: 5' 6.5" (168.9 cm) Weight: 114 lb 1.6 oz (51.755 kg) IBW/kg (Calculated) : 60.45 Heparin Dosing Weight:  Vital Signs: Temp: 97.9 F (36.6 C) (01/08 0605) Temp Source: Oral (01/08 0605) BP: 163/71 mmHg (01/08 0605) Pulse Rate: 96 (01/08 0605)  Labs:  Recent Labs  09/06/14 0507 09/06/14 1147 09/07/14 0600 09/08/14 0452  HGB  --  12.5  --   --   HCT  --  39.5  --   --   PLT  --  183  --   --   LABPROT 24.3*  --  24.9* 25.8*  INR 2.16*  --  2.23* 2.33*  CREATININE  --   --  1.11*  --     Estimated Creatinine Clearance: 27 mL/min (by C-G formula based on Cr of 1.11).   Assessment: 6 YOF with left sided flank pain and h/o Afib. Pharmacy consulted to start Zosyn for UTI and resume Coumadin for AFib.   AC: Coumadin for Afib-admit INR 2.25 on 3mg  daily except 2mg  MWF. INR 2.33 today.  ID: CTX for r/o UTI (Omnicef as outpatient). Hx of Ecoli/Kleb UTIs- all have been sens to cipro/CTX in past. FQ intolerance of N/V. Afeb, wbc wnl. Symptoms- frequent urination.   CTX 1/4 x1 dose Zosyn 1/4 >>1/5 CTX 1/6 >>  1/4 Urine cx - neg  CV: BP elevated (163/71), HR 80s. Clonidine patch, hydralazine, labetalol, pravastatin Endo: DM. CBG ok on SSI + Lantus 15 units daily (23 PTA) GI / Nutr: LFTs wnl, po  PPI Neuro: donepezil Nephro: CKD. SCr 1.11/est CrCl ~ 25-4mL/min. Lytes ok.  Pulm: RA Hematology / Oncology: CBC wnl, po iron. found to have a right colonic mass as well as renal mass on CT of the abdomen and pelvis. Family are considering no colonoscopy or surgical intervention for further workup regarding her colon mass. PTA Medication Issues: addressed Best Practices: SCD, PPI, Warfarin   Goal of Therapy:  INR 2-3 Monitor platelets by anticoagulation protocol: Yes   Plan:  Resume home Coumadin 3mg  daily except 2mg  MWF.   Sharyl Panchal S. Alford Highland, PharmD, [facility] Clinical Staff Pharmacist Pager [phone]  Eilene Ghazi Stillinger 09/08/2014,10:10 AM

## 2014-09-08 NOTE — Progress Notes (Signed)
Physical Therapy Treatment Patient Details Name: Lindsay Sheppard MRN: [id number] DOB: 30-Oct-1920 Today's Date: 09/08/2014    History of Present Illness 81 year old female history of chronic atrial fibrillation on warfarin, type 2 diabetes, chronic kidney disease that was admitted for left flank plain , was found to have UTI, started on IV Zosyn ,and found to have a right colonic mass as well as renal mass on CT of the abdomen and pelvis. Family not considering surgery or intervention regarding colon mass.     PT Comments    Patient more drowsy/lethargic today. Daughter, [name], present and participated fully in session (assisting pt OOB to chair). Multiple options for getting pt home today discussed. Daughter feels strongly that herself and extended family can transport pt via car and assist her up the steps into home. (Denied need for ambulance or medical transport. Denied need for hospital bed). All questions answered. All education completed. Anticipate d/c home today.    Follow Up Recommendations  Home health PT;Supervision/Assistance - 24 hour     Equipment Recommendations  None recommended by PT    Recommendations for Other Services OT consult     Precautions / Restrictions Precautions Precautions: Fall Precaution Comments: frail skin Restrictions Weight Bearing Restrictions: No    Mobility  Bed Mobility Overal bed mobility: Needs Assistance Bed Mobility: Supine to Sit     Supine to sit: HOB elevated;Mod assist     General bed mobility comments: daughter provided handheld (A) to elevate trunk to sitting position and to scoot to EOB due to generalized deconditioning  Transfers Overall transfer level: Needs assistance Equipment used: Rolling walker (2 wheeled) Transfers: Sit to/from Stand Sit to Stand: Min assist;+2 safety/equipment         General transfer comment: pt shaky and unsteady with standing due to overall weakness; daughter led session and required  second person for safety due to pt's weakness and lethargy  Ambulation/Gait Ambulation/Gait assistance: Mod assist;+2 safety/equipment Ambulation Distance (Feet): 3 Feet Assistive device: Rolling walker (2 wheeled) Gait Pattern/deviations: Step-to pattern;Decreased stride length;Leaning posteriorly Gait velocity: decreased   General Gait Details: pt unsteady; daughter (A) with session;   Stairs Stairs:  (see general comments)          Information systems manager mobility:  (educated on up/down stairs in w/c)  Modified Rankin (Stroke Patients Only)       Balance     Sitting balance-Leahy Scale: Poor       Standing balance-Leahy Scale: Poor                      Cognition Arousal/Alertness: Lethargic Behavior During Therapy: Flat affect Overall Cognitive Status: Difficult to assess Area of Impairment: Attention;Problem solving   Current Attention Level: Sustained         Problem Solving: Slow processing;Decreased initiation;Difficulty sequencing;Requires verbal cues;Requires tactile cues General Comments: follows commands with incr time; easily falls asleep when not active/engaged    Exercises      General Comments General comments (skin integrity, edema, etc.): Lengthy education re: options for getting pt home (daughter reports she has 4 siblings and multiple in-laws that will assist); denied need for ambulance; ultimately open to education re: use of w/c (or transfer chair, which they have both) for up steps at home. Daughter denied need for hospital bed. Educated her on benefits and she understood this may be an option to consider. Education re: role of HHPT and daughter anticipates they will have same HHPT they have  used several times and will help them determine if pt can "rehab" and regain some independence and quality of life vs time to involve Hospice      Pertinent Vitals/Pain Pain Assessment: Faces Faces Pain Scale:  Hurts even more Pain Location: Lt foot dorsum Pain Intervention(s): Limited activity within patient's tolerance;Monitored during session;Repositioned (daughter reports this has been ongoing PTA)    Home Living                      Prior Function            PT Goals (current goals can now be found in the care plan section) Acute Rehab PT Goals Patient Stated Goal: per daughter "i just want to keep my mom as independent as i can"  Progress towards PT goals: Not progressing toward goals - comment (less alert; family education completed)    Frequency  Min 3X/week    PT Plan Current plan remains appropriate    Co-evaluation             End of Session   Activity Tolerance: Patient limited by fatigue;Patient limited by lethargy Patient left: in chair;with call bell/phone within reach;with family/visitor present     Time: 8875-7972 (time adjusted to account for RN procedure) PT Time Calculation (min) (ACUTE ONLY): 45 min  Charges:  $Therapeutic Activity: 8-22 mins $Self Care/Home Management: 23-37                    G Codes:      Alaijah Gibler October 01, 2014, 1:11 PM Pager [phone]

## 2014-09-08 NOTE — Discharge Summary (Addendum)
Lindsay Sheppard, 81 y.o., DOB 01-Jul-1921, MRN [id number]. Admission date: 09/04/2014 Discharge Date 09/08/2014 Primary MD [name], MD Admitting Physician Rise Patience, MD  Admission Diagnosis  Pain [R52] Chest rales [R09.89] Urinary tract infection without hematuria, site unspecified [N39.0]  Discharge Diagnosis   Principal Problem:   Abdominal pain Active Problems:   CKD (chronic kidney disease) 3-4   Accelerated hypertension   Chronic atrial fibrillation   Diabetes mellitus type 2, controlled   Urinary tract infection   Colonic mass   UTI (lower urinary tract infection)   Palliative care encounter   Loss of weight   Flank pain   Urinary tract infectious disease     PCP please follow: - Recheck patient's labs including CBC, BMP,INR during next visit, and adjust warfarin dose if needed. - Patient developed urinary retention during the hospital stay, will be discharged with Foley catheter, please evaluate as an outpatient for voiding trials, patient will be started on Flomax on discharge.  Past Medical History  Diagnosis Date  . POSTHERPETIC NEURALGIA 11/02/2009  . B12 DEFICIENCY 05/23/2007  . HYPERLIPIDEMIA 05/23/2007  . HYPERKALEMIA 05/30/2010  . ANEMIA-IRON DEFICIENCY 01/26/2007  . BLEPHARITIS, BILATERAL 02/23/2008  . Other specified forms of hearing loss 05/30/2010  . HYPERTENSION 01/26/2007  . Atrial fibrillation 05/23/2007  . DIASTOLIC HEART FAILURE, CHRONIC 12/14/2009  . POSTURAL HYPOTENSION 02/07/2008  . ALLERGIC RHINITIS 11/02/2009  . GERD 01/26/2007  . CONSTIPATION 01/25/2008  . SKIN LESION 05/30/2010  . BACK PAIN 08/02/2007  . OSTEOPOROSIS 05/23/2007  . SYNCOPE 02/07/2008  . FATIGUE 09/06/2007  . Dysuria 09/05/2008  . Abdominal pain, epigastric 09/06/2007  . EPIGASTRIC TENDERNESS 10/12/2007  . PULMONARY EMBOLISM, HX OF 05/23/2007  . SHINGLES, HX OF 05/23/2007  . 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 Left foot pain   . Diabetic neuropathy   . RENAL INSUFFICIENCY 07/02/2009  .  RENAL CYST, LEFT 09/06/2007  . Kidney stones     "never had OR"  . Family history of anesthesia complication     "daughter has PONV; another daughter a little anesthesia lasts way too long"  . CHF (congestive heart failure) 2011  . Myocardial infarction 2011    "mild"  . DVT (deep venous thrombosis) ~ 1964    "think it was in the left"  . Aspiration pneumonia ~ 2006    "due to aspiration"  . DIABETES MELLITUS, TYPE II 09/06/2007  . DEPRESSIVE DISORDER 01/25/2008    "@ times; never treated for it"  . Melanoma of nose   . Compression fracture of lumbar vertebra     hx  . Compression fracture of thoracic vertebra 02/07/2014    "fell this am" (02/07/2014  . Arthritis     "hands" (03/15/2014)    Past Surgical History  Procedure Laterality Date  . Appendectomy  1943  . Abdominal hysterectomy  1964  . Cardiac electrophysiology mapping and ablation  1999  . Cataract extraction w/ intraocular lens  implant, bilateral  2003-2005  . Carpal tunnel release Right 2004  . Mohs surgery  2011    "removed off her nose"  . Cardiac catheterization  10/2009  . Esophageal dilation  X 2     Hospital Course See H&P, Labs, Consult and Test reports for all details in brief, patient was admitted for   Principal Problem:   Abdominal pain Active Problems:   CKD (chronic kidney disease) 3-4   Accelerated hypertension   Chronic atrial fibrillation   Diabetes mellitus type 2, controlled  Urinary tract infection   Colonic mass   UTI (lower urinary tract infection)   Palliative care encounter   Loss of weight   Flank pain   Urinary tract infectious disease  Lindsay Sheppard is a 81 y.o. female with history of chronic atrial fibrillation, diabetes mellitus type 2, chronic kidney disease who was recently admitted for accelerated hypertension presents to the ER because of increasing left flank pain since yesterday. Patient's family also noted that patient has not been urinating for the last few hours before  being to the ER. In the ER patient had a CT abdomen and pelvis which show left renal mass and also a right colon mass. It also shows mild hydronephrosis. UA shows features consistent with UTI. In addition patient found to be significantly elevated with systolic blood pressure more than 200. Patient has been admitted for further management. Patient denies any chest pain or shortness of breath. After patient came to the ER patient was able to void urine spontaneously and at 1700 mL voided following which patient's pain has largely reduced. Patient had voiding trial, which she did not pass, and Foley was required to be reinserted on day of discharge. Patient CT abdomen and pelvis was significant for colon mass, and renal mass, discussed with the family goals of care, palliative care were involved, and family does not wish for any aggressive workup including colonoscopy or surgical intervention, so patient will be discharged home with home care, and the family offered the option of home hospice, but they would rather pursue this option with their primary care as he wanted to continue physical therapy for now. Regarding the renal mass patient was seen by Dr. Alexis Frock, given the mass is very slow-growing, would not favor any biopsy or further dedicated imaging during this admission, the plan is for yearly ultrasound as an outpatient.  Abdominal pain/left flank pain with left renal mass/UTI -Patient currently being treated initially for UTI with Zosyn was transitioned to Rocephin 1/6, will be discharged on 2 days of oral Augmentin to finish total of 5 days. -Patient has some urinary retention, Foley catheter inserted, failed voiding trial.. -Urology consult appreciated, no further workup for renal mass side yearly ultrasound.  Urinary retention - We'll start on Flomax on discharge, will discharge home with Foley catheter, to follow with urology next week for another voiding trial. Thus with Dr.  Gaynelle Arabian.  Right colonic mass -General surgery and GI consults appreciated - Palliative care consult appreciated - At this point family does not wish to pursue any further workup  Chronic atrial fibrillation -We'll resume warfarin on discharge, INR is therapeutic today at 2.33  Hypertension - Very labile, unclear if elevated secondary to discomfort from urinary retention, improved on oral hydralazine, will add hydralazine 25 mg oral 3 times a day to her home regimen including labetalol and clonidine patch.    Procedures None   Consults  Urology Gastroenterology Gen. surgery Palliative care  Medications  Significant Tests:  See full reports for all details    Dg Chest 2 View  09/04/2014   CLINICAL DATA:  Chest rales.  EXAM: CHEST  2 VIEW  COMPARISON:  Radiograph 08/27/2014  FINDINGS: Patient is rotated to the right. Diminished hyperinflation from prior. Cardiomediastinal contours are normal and unchanged. Atherosclerosis noted of the aortic arch. Pulmonary vasculature is normal. Minimal atelectasis or scarring in the left midlung zone. Decreased right basilar atelectasis. No confluent airspace disease. No pleural effusion or pneumothorax. The bones appear under mineralized. Compression  deformities again noted in the thoracic spine  IMPRESSION: No acute pulmonary process.   Electronically Signed   By: Jeb Levering M.D.   On: 09/04/2014 19:02   Ct Head Wo Contrast  08/27/2014   CLINICAL DATA:  Hypertension.  EXAM: CT HEAD WITHOUT CONTRAST  TECHNIQUE: Contiguous axial images were obtained from the base of the skull through the vertex without intravenous contrast.  COMPARISON:  Head CT scan 03/19/2014.  Brain MRI 03/21/2014.  FINDINGS: The brain is atrophic with mild appearing chronic microvascular ischemic change. No evidence of acute intracranial abnormality including infarct, hemorrhage, mass lesion, mass effect, midline shift or abnormal extra-axial fluid collection. No  hydrocephalus or pneumocephalus. The calvarium is intact. Imaged paranasal sinuses and mastoid air cells are clear.  IMPRESSION: No acute abnormality.  Atrophy and mild chronic microvascular ischemic change.   Electronically Signed   By: Inge Rise M.D.   On: 08/27/2014 19:15   Ct Abdomen Pelvis W Contrast  09/04/2014   CLINICAL DATA:  Lower abdomen and left flank pain. Recently treated for a urinary tract infection. Previous appendectomy and hysterectomy.  EXAM: CT ABDOMEN AND PELVIS WITH CONTRAST  TECHNIQUE: Multidetector CT imaging of the abdomen and pelvis was performed using the standard protocol following bolus administration of intravenous contrast.  CONTRAST:  150mL OMNIPAQUE IOHEXOL 300 MG/ML  SOLN  COMPARISON:  Pelvis CT dated 06/23/2008 and abdomen CT dated 01/28/2008.  FINDINGS: The previously demonstrated 2.9 cm fat containing left adrenal mass currently measures 2.8 x 1.6 cm on image 19. There is a large mass in the right colon extending superior and inferior to the ileocecal valve. On coronal image number 58, this measures 5.8 x 4.6 cm. On axial image number 41, this measures 4.2 cm in AP diameter.  Increased prominence of bilateral extrarenal pelves with interval distention of the urinary bladder. Mild inferior bladder wall thickening as well as partial vaginal prolapse of the bladder. No urinary tract calculi are seen.  Mild diffuse low density of the liver relative to the spleen. 1.2 x 0.7 cm polyp or non calcified gallstone in the gallbladder fundus on image number 29. Unremarkable spleen, pancreas and right adrenal gland. The previously demonstrated 1.2 x 1.0 cm mid left renal mass suspicious for a primary renal neoplasm is slightly larger, currently measuring 1.6 x 1.3 cm on image number 27. This measures 72 Hounsfield units in density.  Old, healed bilateral pubic fractures. Diffuse osteopenia. Multiple old lumbar and lower thoracic vertebral compression fractures. Some of these are  associated with bony retropulsion. This is most pronounced at the upper L2 level, with moderate canal stenosis. No acute fracture lines are seen. Atheromatous arterial calcifications. Clear lung bases.  IMPRESSION: 1. 5.8 x 4.6 x 4.2 cm polypoid right colon mass extending superior and inferior to the ileocecal valve. This is concerning for a primary colon neoplasm. 2. Mild increase in size of the previously demonstrated left renal mass suspicious for a renal cell carcinoma on a previous MR. 3. Prolapse of the urinary bladder into the upper vagina. 4. Mild inferior bladder wall thickening. This could be due to plaque-like tumor or reactive changes due to chronic prolapse into the vagina. 5. Distention of the urinary bladder with associated mild bilateral hydronephrosis. 6. 1.2 cm arm polyp or noncalcified gallstone in the gallbladder without evidence of cholecystitis. 7. Stable left adrenal myelolipoma. 8. Diffuse osteopenia with old vertebral compression fractures and old, healed bilateral pubic fractures. 9. Mild diffuse hepatic steatosis.   Electronically Signed  By: Enrique Sack M.D.   On: 09/04/2014 21:36   US Renal  09/05/2014   CLINICAL DATA:  Elevated BUN and creatinine in, concern for hydronephrosis, history of renal stones and diabetes  EXAM: RENAL/URINARY TRACT ULTRASOUND COMPLETE  COMPARISON:  09/04/2014 CT scan  FINDINGS: Right Kidney:  Length: 8.6 cm. Normal echogenicity. Cortical thinning. 11 mm upper pole cyst. No hydronephrosis.  Left Kidney:  Length: 8.9 cm. Normal echogenicity with cortical thinning. As seen on CT scan, there is an oval lesion upper pole to midpole left kidney measuring 16 x 12 x 14 mm. It is hypoechoic with evidence of some vascularity in the wall.  Bladder:  Appears normal for degree of bladder distention.  IMPRESSION: Bilateral atrophy.  No hydronephrosis.  Indeterminate lesion upper pole left kidney measuring 16 mm. Solid mass not excluded. This has been seen on prior CT scan  and MRI and has been considered suspicious for renal cell carcinoma.   Electronically Signed   By: Skipper Cliche M.D.   On: 09/05/2014 08:36   Dg Chest Port 1 View  08/27/2014   CLINICAL DATA:  Weakness, hypertension  EXAM: PORTABLE CHEST - 1 VIEW  COMPARISON:  03/19/2014  FINDINGS: Cardiomediastinal silhouette is stable. Hyperinflation again noted. Atherosclerotic calcifications of thoracic aorta. No acute infiltrate or pleural effusion. No pulmonary edema.  IMPRESSION: No active disease.  Hyperinflation again noted.   Electronically Signed   By: Lahoma Crocker M.D.   On: 08/27/2014 16:34     Today   Subjective:   Nejla Reasor today has no headache,no chest abdominal pain,no new weakness tingling or numbness, feels much better wants to go home today. Patient had urinary retention overnight, so Foley catheter was reinserted.  Objective:   Blood pressure 125/45, pulse 91, temperature 98.4 F (36.9 C), temperature source Oral, resp. rate 20, height 5' 6.5" (1.689 m), weight 51.755 kg (114 lb 1.6 oz), SpO2 97 %.  Intake/Output Summary (Last 24 hours) at 09/08/14 1106 Last data filed at 09/08/14 0208  Gross per 24 hour  Intake      0 ml  Output    350 ml  Net   -350 ml    Exam  Awake Alert, Oriented , cachectic, chronically ill-appearing. No new F.N deficits, Normal affect [place].AT,PERRAL Supple Neck,No JVD, No cervical lymphadenopathy appriciated.  Symmetrical Chest wall movement, Good air movement bilaterally, CTAB RRR,No Gallops,Rubs or new Murmurs, No Parasternal Heave +ve B.Sounds, Abd Soft, No tenderness, No organomegaly appriciated, No rebound - guarding or rigidity. No Cyanosis, Clubbing or edema, No new Rash or bruise    Data Review   Cultures -  Results for orders placed or performed during the hospital encounter of 09/04/14  Urine culture     Status: None   Collection Time: 09/04/14  6:17 PM  Result Value Ref Range Status   Specimen Description URINE, CLEAN CATCH   Final   Special Requests Immunocompromised  Final   Colony Count NO GROWTH Performed at [facility]   Final   Culture NO GROWTH Performed at [facility]   Final   Report Status 09/06/2014 FINAL  Final     CBC w Diff:  Lab Results  Component Value Date   WBC 5.3 09/06/2014   HGB 12.5 09/06/2014   HCT 39.5 09/06/2014   PLT 183 09/06/2014   LYMPHOPCT 17 09/05/2014   MONOPCT 9 09/05/2014   EOSPCT 2 09/05/2014   BASOPCT 0 09/05/2014   CMP:  Lab Results  Component Value Date   NA 135 09/07/2014   K 4.0 09/07/2014   CL 104 09/07/2014   CO2 26 09/07/2014   BUN 20 09/07/2014   CREATININE 1.11* 09/07/2014   PROT 5.4* 09/05/2014   ALBUMIN 3.2* 09/05/2014   BILITOT 0.4 09/05/2014   ALKPHOS 59 09/05/2014   AST 23 09/05/2014   ALT 22 09/05/2014  .  Micro Results Recent Results (from the past 240 hour(s))  Urine culture     Status: None   Collection Time: 09/04/14  6:17 PM  Result Value Ref Range Status   Specimen Description URINE, CLEAN CATCH  Final   Special Requests Immunocompromised  Final   Colony Count NO GROWTH Performed at [facility]   Final   Culture NO GROWTH Performed at [facility]   Final   Report Status 09/06/2014 FINAL  Final     Discharge Instructions         Follow-up Information    Follow up with Carolan Clines I, MD. Schedule an appointment as soon as possible for a visit in 1 week.   Specialty:  Urology   Why:  Please colonic an appointment within 1 week with Dr. Gaynelle Arabian or one of his associates for voiding trial.   Contact information:   [place] [address] [phone]       Discharge Medications     Medication List    STOP taking these medications        cefdinir 300 MG capsule  Commonly known as:  OMNICEF      TAKE these medications        acetaminophen 325 MG tablet  Commonly known as:  TYLENOL  Take 2 tablets (650 mg total) by mouth 3 (three) times daily.      amoxicillin-clavulanate 875-125 MG per tablet  Commonly known as:  AUGMENTIN  Take 1 tablet by mouth 2 (two) times daily.  Start taking on:  09/09/2014     ARTIFICIAL TEARS OP  Place 1 drop into both eyes daily.     cloNIDine 0.3 mg/24hr patch  Commonly known as:  CATAPRES - Dosed in mg/24 hr  Place 0.3 mg onto the skin once a week.     CRANBERRY PO  Take 500 mg by mouth daily.     cyanocobalamin 1000 MCG/ML injection  Commonly known as:  (VITAMIN B-12)  INJECT 1 ML (1000 MCG) INTO THE MUSCLE EVERY 30 DAYS.     donepezil 10 MG tablet  Commonly known as:  ARICEPT  Take 1 tablet (10 mg total) by mouth at bedtime.     feeding supplement (GLUCERNA SHAKE) Liqd  Take 237 mLs by mouth 2 (two) times daily between meals.     ferrous sulfate 325 (65 FE) MG tablet  Take 1 tablet (325 mg total) by mouth 2 (two) times daily with a meal.     fluticasone 50 MCG/ACT nasal spray  Commonly known as:  FLONASE  Place 2 sprays into the nose daily.     hydrALAZINE 25 MG tablet  Commonly known as:  APRESOLINE  Take 1 tablet (25 mg total) by mouth 3 (three) times daily.     insulin glargine 100 UNIT/ML injection  Commonly known as:  LANTUS  Inject 23 Units into the skin daily.     labetalol 200 MG tablet  Commonly known as:  NORMODYNE  Take 400 mg by mouth 2 (two) times daily.     pantoprazole 40 MG tablet  Commonly known as:  PROTONIX  Take 1 tablet (40 mg total) by mouth daily.     polyethylene glycol packet  Commonly known as:  MIRALAX / GLYCOLAX  Take 17 g by mouth 2 (two) times daily.     pravastatin 40 MG tablet  Commonly known as:  PRAVACHOL  Take 20 mg by mouth every evening.     tamsulosin 0.4 MG Caps capsule  Commonly known as:  FLOMAX  Take 1 capsule (0.4 mg total) by mouth daily after supper.     warfarin 2 MG tablet  Commonly known as:  COUMADIN  Follow instructions per coumadin clinic         Total Time in preparing paper work, data evaluation and todays  exam - 35 minutes  Tashari Schoenfelder M.D on 09/08/2014 at 11:06 AM  [facility] Office  [phone]

## 2014-09-08 NOTE — Discharge Instructions (Signed)
Follow with Primary MD [name], MD in 7 days   Get CBC, CMP, 2 view Chest X ray checked  by Primary MD next visit.  CONTINUE WITH [facility] TILL YOU ARE SEEN BY UROLOGY.   Activity: As tolerated with Full fall precautions use walker/cane & assistance as needed   Disposition Home WITH HOME CARE   Diet: Heart Healthy  , with feeding assistance and aspiration precautions as needed.  For Heart failure patients - Check your Weight same time everyday, if you gain over 2 pounds, or you develop in leg swelling, experience more shortness of breath or chest pain, call your Primary MD immediately. Follow Cardiac Low Salt Diet and 1.8 lit/day fluid restriction.   On your next visit with your primary care physician please Get Medicines reviewed and adjusted.   Please request your Prim.MD to go over all Hospital Tests and Procedure/Radiological results at the follow up, please get all Hospital records sent to your Prim MD by signing hospital release before you go home.   If you experience worsening of your admission symptoms, develop shortness of breath, life threatening emergency, suicidal or homicidal thoughts you must seek medical attention immediately by calling 911 or calling your MD immediately  if symptoms less severe.  You Must read complete instructions/literature along with all the possible adverse reactions/side effects for all the Medicines you take and that have been prescribed to you. Take any new Medicines after you have completely understood and accpet all the possible adverse reactions/side effects.   Do not drive, operating heavy machinery, perform activities at heights, swimming or participation in water activities or provide baby sitting services if your were admitted for syncope or siezures until you have seen by Primary MD or a Neurologist and advised to do so again.  Do not drive when taking Pain medications.    Do not take more than prescribed Pain, Sleep and  Anxiety Medications  Special Instructions: If you have smoked or chewed Tobacco  in the last 2 yrs please stop smoking, stop any regular Alcohol  and or any Recreational drug use.  Wear Seat belts while driving.   Please note  You were cared for by a hospitalist during your hospital stay. If you have any questions about your discharge medications or the care you received while you were in the hospital after you are discharged, you can call the unit and asked to speak with the hospitalist on call if the hospitalist that took care of you is not available. Once you are discharged, your primary care physician will handle any further medical issues. Please note that NO REFILLS for any discharge medications will be authorized once you are discharged, as it is imperative that you return to your primary care physician (or establish a relationship with a primary care physician if you do not have one) for your aftercare needs so that they can reassess your need for medications and monitor your lab values.   Information on my medicine - Coumadin   (Warfarin)  This medication education was reviewed with me or my healthcare representative as part of my discharge preparation.  The pharmacist that spoke with me during my hospital stay was:  Wayland Salinas, [facility]  Why was Coumadin prescribed for you? Coumadin was prescribed for you because you have a blood clot or a medical condition that can cause an increased risk of forming blood clots. Blood clots can cause serious health problems by blocking the flow of blood to the heart,  lung, or brain. Coumadin can prevent harmful blood clots from forming. As a reminder your indication for Coumadin is:   Stroke Prevention Because Of Atrial Fibrillation  What test will check on my response to Coumadin? While on Coumadin (warfarin) you will need to have an INR test regularly to ensure that your dose is keeping you in the desired range. The INR (international  normalized ratio) number is calculated from the result of the laboratory test called prothrombin time (PT).  If an INR APPOINTMENT HAS NOT ALREADY BEEN MADE FOR YOU please schedule an appointment to have this lab work done by your health care provider within 7 days. Your INR goal is usually a number between:  2 to 3 or your provider may give you a more narrow range like 2-2.5.  Ask your health care provider during an office visit what your goal INR is.  What  do you need to  know  About  COUMADIN? Take Coumadin (warfarin) exactly as prescribed by your healthcare provider about the same time each day.  DO NOT stop taking without talking to the doctor who prescribed the medication.  Stopping without other blood clot prevention medication to take the place of Coumadin may increase your risk of developing a new clot or stroke.  Get refills before you run out.  What do you do if you miss a dose? If you miss a dose, take it as soon as you remember on the same day then continue your regularly scheduled regimen the next day.  Do not take two doses of Coumadin at the same time.  Important Safety Information A possible side effect of Coumadin (Warfarin) is an increased risk of bleeding. You should call your healthcare provider right away if you experience any of the following: ? Bleeding from an injury or your nose that does not stop. ? Unusual colored urine (red or dark Reier) or unusual colored stools (red or black). ? Unusual bruising for unknown reasons. ? A serious fall or if you hit your head (even if there is no bleeding).  Some foods or medicines interact with Coumadin (warfarin) and might alter your response to warfarin. To help avoid this: ? Eat a balanced diet, maintaining a consistent amount of Vitamin K. ? Notify your provider about major diet changes you plan to make. ? Avoid alcohol or limit your intake to 1 drink for women and 2 drinks for men per day. (1 drink is 5 oz. wine, 12 oz.  beer, or 1.5 oz. liquor.)  Make sure that ANY health care provider who prescribes medication for you knows that you are taking Coumadin (warfarin).  Also make sure the healthcare provider who is monitoring your Coumadin knows when you have started a new medication including herbals and non-prescription products.  Coumadin (Warfarin)  Major Drug Interactions  Increased Warfarin Effect Decreased Warfarin Effect  Alcohol (large quantities) Antibiotics (esp. Septra/Bactrim, Flagyl, Cipro) Amiodarone (Cordarone) Aspirin (ASA) Cimetidine (Tagamet) Megestrol (Megace) NSAIDs (ibuprofen, naproxen, etc.) Piroxicam (Feldene) Propafenone (Rythmol SR) Propranolol (Inderal) Isoniazid (INH) Posaconazole (Noxafil) Barbiturates (Phenobarbital) Carbamazepine (Tegretol) Chlordiazepoxide (Librium) Cholestyramine (Questran) Griseofulvin Oral Contraceptives Rifampin Sucralfate (Carafate) Vitamin K   Coumadin (Warfarin) Major Herbal Interactions  Increased Warfarin Effect Decreased Warfarin Effect  Garlic Ginseng Ginkgo biloba Coenzyme Q10 Green tea St. Johns wort    Coumadin (Warfarin) FOOD Interactions  Eat a consistent number of servings per week of foods HIGH in Vitamin K (1 serving =  cup)  Collards (cooked, or boiled & drained) Kale (cooked,  or boiled & drained) Mustard greens (cooked, or boiled & drained) Parsley *serving size only =  cup Spinach (cooked, or boiled & drained) Swiss chard (cooked, or boiled & drained) Turnip greens (cooked, or boiled & drained)  Eat a consistent number of servings per week of foods MEDIUM-HIGH in Vitamin K (1 serving = 1 cup)  Asparagus (cooked, or boiled & drained) Broccoli (cooked, boiled & drained, or raw & chopped) Brussel sprouts (cooked, or boiled & drained) *serving size only =  cup Lettuce, raw (green leaf, endive, romaine) Spinach, raw Turnip greens, raw & chopped   These websites have more information on Coumadin (warfarin):   FailFactory.se; VeganReport.com.au;

## 2014-09-08 NOTE — Progress Notes (Signed)
TCT [name] with [facility] to draw INR on 09/11/2014 and have results called to the PCP; B [place] RN,BSN,MHA [phone]

## 2014-09-08 NOTE — Progress Notes (Signed)
Met briefly with daughter this morning.  They have decided to take Chanay home today and pursue their continued home health services.  They want to see how this goes and if not doing well (ie declining, not able to do any rehab), then they will call PCP to engage home hospice services.  They also have my contact information should any questions or concerns arise even after they leave hospital.    Doran Clay D.O. Palliative Medicine Team at [facility]  Pager: [phone] Team Phone: [phone]

## 2014-09-08 NOTE — Progress Notes (Signed)
Talked to patient's daughter about DCP; she has decided for patient to return home with [facility] as prior to hospitalization and to try home hospice at a later time; [name] with [facility] made aware of discharge home todayAneta Mins [phone]

## 2014-09-11 ENCOUNTER — Ambulatory Visit: Payer: Medicare Other

## 2014-09-11 ENCOUNTER — Telehealth: Payer: Self-pay | Admitting: Internal Medicine

## 2014-09-11 DIAGNOSIS — Z5181 Encounter for therapeutic drug level monitoring: Secondary | ICD-10-CM

## 2014-09-11 LAB — POCT INR: INR: 3.4

## 2014-09-11 LAB — CEA: CEA: 4.4 ng/mL (ref 0.0–4.7)

## 2014-09-11 NOTE — Progress Notes (Signed)
The following was reported:   INR 3.4 PT 40.8 Just finished antibiotic yesterday

## 2014-09-11 NOTE — Telephone Encounter (Signed)
INR 3.4 PT 40.8 Just finished antibiotic yesterday

## 2014-09-11 NOTE — Telephone Encounter (Signed)
Done.  See anti-coag visit note for details.

## 2014-09-12 ENCOUNTER — Other Ambulatory Visit: Payer: Self-pay | Admitting: Internal Medicine

## 2014-09-18 ENCOUNTER — Encounter: Payer: Self-pay | Admitting: Internal Medicine

## 2014-09-18 ENCOUNTER — Telehealth: Payer: Self-pay | Admitting: Internal Medicine

## 2014-09-18 LAB — POCT INR
INR: 4.4 — AB (ref 0.9–1.1)
INR: 4.6 — AB (ref 0.9–1.1)

## 2014-09-18 NOTE — Telephone Encounter (Signed)
INR results from 09/18/2014 4.6  Finished antibiotic on 09/11/2014. Needs a call back to confirm dose and when to check

## 2014-09-19 ENCOUNTER — Ambulatory Visit (INDEPENDENT_AMBULATORY_CARE_PROVIDER_SITE_OTHER): Payer: Medicare Other | Admitting: Internal Medicine

## 2014-09-19 ENCOUNTER — Ambulatory Visit (INDEPENDENT_AMBULATORY_CARE_PROVIDER_SITE_OTHER): Payer: Medicare Other

## 2014-09-19 ENCOUNTER — Encounter: Payer: Self-pay | Admitting: Internal Medicine

## 2014-09-19 VITALS — BP 148/50 | HR 81 | Temp 97.8°F | Ht 66.5 in | Wt 124.2 lb

## 2014-09-19 DIAGNOSIS — E785 Hyperlipidemia, unspecified: Secondary | ICD-10-CM

## 2014-09-19 DIAGNOSIS — F0390 Unspecified dementia without behavioral disturbance: Secondary | ICD-10-CM

## 2014-09-19 DIAGNOSIS — E119 Type 2 diabetes mellitus without complications: Secondary | ICD-10-CM

## 2014-09-19 DIAGNOSIS — Z5181 Encounter for therapeutic drug level monitoring: Secondary | ICD-10-CM

## 2014-09-19 DIAGNOSIS — I10 Essential (primary) hypertension: Secondary | ICD-10-CM

## 2014-09-19 LAB — POCT INR: INR: 4.6

## 2014-09-19 MED ORDER — HYDRALAZINE HCL 25 MG PO TABS: 25.0000 mg | ORAL_TABLET | Freq: Three times a day (TID) | ORAL | Status: DC

## 2014-09-19 NOTE — Patient Instructions (Addendum)
OK to stay off the aricept  Please continue all other medications as before, and refills have been done if requested.  Please have the pharmacy call with any other refills you may need.  Please keep your appointments with your specialists as you may have planned  No further lab work needed today  Please return in 3 months, or sooner if needed

## 2014-09-19 NOTE — Addendum Note (Signed)
Addended by: Biagio Borg on: 09/19/2014 09:47 AM   Modules accepted: Orders

## 2014-09-19 NOTE — Progress Notes (Signed)
Pre visit review using our clinic review tool, if applicable. No additional management support is needed unless otherwise documented below in the visit note. 

## 2014-09-19 NOTE — Telephone Encounter (Signed)
New dose called to [facility] @ [phone].  Please see Anti-coag visit encounter for details.

## 2014-09-19 NOTE — Assessment & Plan Note (Signed)
Mild elev today, but goal only to be < 150, stable overall by history and exam, recent data reviewed with pt, and pt to continue medical treatment as before,  to f/u any worsening symptoms or concerns BP Readings from Last 3 Encounters:  09/19/14 148/50  09/08/14 106/44  08/29/14 130/50

## 2014-09-19 NOTE — Progress Notes (Signed)
Subjective:    Patient ID: Lindsay Sheppard, female    DOB: 1921/04/26, 81 y.o.   MRN: [id number]  HPI  Here after recent hospn with UTI, incidentally with new finding of right colon mass. See per urology - Dr Gaynelle Arabian.  Flomax stopped recently., might consider myrbetrix.   Recent CT with chronic no change mass to left kidney - not a current issue per urology. Also family asks about an adrenal incidentaloma, wondering if has something to do with her hot flashes.  Has recent colon mass, colonoscopy deferred as she and family decided she is nonsurgical candidate. Not offered for oncology as no treatment to be done.  Home now with [facility] only for now, family realistic regarding need for Hospice at some point.   For chronic foley per urology.  Family also took her off aricept due to naausea last 2 days, dont plan to re-start. Past Medical History  Diagnosis Date  . POSTHERPETIC NEURALGIA 11/02/2009  . B12 DEFICIENCY 05/23/2007  . HYPERLIPIDEMIA 05/23/2007  . HYPERKALEMIA 05/30/2010  . ANEMIA-IRON DEFICIENCY 01/26/2007  . BLEPHARITIS, BILATERAL 02/23/2008  . Other specified forms of hearing loss 05/30/2010  . HYPERTENSION 01/26/2007  . Atrial fibrillation 05/23/2007  . DIASTOLIC HEART FAILURE, CHRONIC 12/14/2009  . POSTURAL HYPOTENSION 02/07/2008  . ALLERGIC RHINITIS 11/02/2009  . GERD 01/26/2007  . CONSTIPATION 01/25/2008  . SKIN LESION 05/30/2010  . BACK PAIN 08/02/2007  . OSTEOPOROSIS 05/23/2007  . SYNCOPE 02/07/2008  . FATIGUE 09/06/2007  . Dysuria 09/05/2008  . Abdominal pain, epigastric 09/06/2007  . EPIGASTRIC TENDERNESS 10/12/2007  . PULMONARY EMBOLISM, HX OF 05/23/2007  . SHINGLES, HX OF 05/23/2007  . 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 Left foot pain   . Diabetic neuropathy   . RENAL INSUFFICIENCY 07/02/2009  . RENAL CYST, LEFT 09/06/2007  . Kidney stones     "never had OR"  . Family history of anesthesia complication     "daughter has PONV; another daughter a little anesthesia lasts way too long"  . CHF  (congestive heart failure) 2011  . Myocardial infarction 2011    "mild"  . DVT (deep venous thrombosis) ~ 1964    "think it was in the left"  . Aspiration pneumonia ~ 2006    "due to aspiration"  . DIABETES MELLITUS, TYPE II 09/06/2007  . DEPRESSIVE DISORDER 01/25/2008    "@ times; never treated for it"  . Melanoma of nose   . Compression fracture of lumbar vertebra     hx  . Compression fracture of thoracic vertebra 02/07/2014    "fell this am" (02/07/2014  . Arthritis     "hands" (03/15/2014)   Past Surgical History  Procedure Laterality Date  . Appendectomy  1943  . Abdominal hysterectomy  1964  . Cardiac electrophysiology mapping and ablation  1999  . Cataract extraction w/ intraocular lens  implant, bilateral  2003-2005  . Carpal tunnel release Right 2004  . Mohs surgery  2011    "removed off her nose"  . Cardiac catheterization  10/2009  . Esophageal dilation  X 2    reports that she has never smoked. She has never used smokeless tobacco. She reports that she does not drink alcohol or use illicit drugs. family history includes Breast cancer in her mother; Colon cancer in her father; Diabetes in her sister; Hypertension in her other; Stroke in her father. Allergies  Allergen Reactions  . Amiodarone Other (See Comments)    REACTION: f? fluid retention  . Deltasone [Prednisone]  Other (See Comments)    unknown  . Diltiazem Hcl Other (See Comments)    unknown  . Levofloxacin Nausea And Vomiting    REACTION: nausea  . Pregabalin Other (See Comments)    REACTION: hallucinations  . Spironolactone Other (See Comments)    REACTION: elevated K at lowest dose  . Tequin [Gatifloxacin] Other (See Comments)    Hypoglycemia  . Diazepam Anxiety and Other (See Comments)    REACTION: agitation   Current Outpatient Prescriptions on File Prior to Visit  Medication Sig Dispense Refill  . acetaminophen (TYLENOL) 325 MG tablet Take 2 tablets (650 mg total) by mouth 3 (three) times daily.      . cloNIDine (CATAPRES - DOSED IN MG/24 HR) 0.3 mg/24hr patch Place 0.3 mg onto the skin once a week.    Marland Kitchen CRANBERRY PO Take 500 mg by mouth daily.     . cyanocobalamin (,VITAMIN B-12,) 1000 MCG/ML injection INJECT 1 ML (1000 MCG) INTO THE MUSCLE EVERY 30 DAYS. 1 mL 11  . donepezil (ARICEPT) 10 MG tablet Take 1 tablet (10 mg total) by mouth at bedtime. 90 tablet 3  . feeding supplement, GLUCERNA SHAKE, (GLUCERNA SHAKE) LIQD Take 237 mLs by mouth 2 (two) times daily between meals. (Patient taking differently: Take 237 mLs by mouth daily. )  0  . ferrous sulfate 325 (65 FE) MG tablet Take 1 tablet (325 mg total) by mouth 2 (two) times daily with a meal. 60 tablet 1  . fluticasone (FLONASE) 50 MCG/ACT nasal spray Place 2 sprays into the nose daily. 16 g 2  . hydrALAZINE (APRESOLINE) 25 MG tablet Take 1 tablet (25 mg total) by mouth 3 (three) times daily. 90 tablet 0  . Hypromellose (ARTIFICIAL TEARS OP) Place 1 drop into both eyes daily.    . insulin glargine (LANTUS) 100 UNIT/ML injection Inject 23 Units into the skin daily.     Marland Kitchen labetalol (NORMODYNE) 200 MG tablet Take 400 mg by mouth 2 (two) times daily.    . pantoprazole (PROTONIX) 40 MG tablet TAKE 1 BY MOUTH DAILY 90 tablet 3  . polyethylene glycol (MIRALAX / GLYCOLAX) packet Take 17 g by mouth 2 (two) times daily. 14 each 0  . warfarin (COUMADIN) 2 MG tablet Follow instructions per coumadin clinic (Patient taking differently: Take 2-3 mg by mouth daily. Take 3 mg daily EXCEPT 2mg  on MWF) 90 tablet 0  . tamsulosin (FLOMAX) 0.4 MG CAPS capsule Take 1 capsule (0.4 mg total) by mouth daily after supper. (Patient not taking: Reported on 09/19/2014) 30 capsule 0   No current facility-administered medications on file prior to visit.    Review of Systems  Constitutional: Negative for unusual diaphoresis or other sweats  HENT: Negative for ringing in ear Eyes: Negative for double vision or worsening visual disturbance.  Respiratory: Negative for  choking and stridor.   Gastrointestinal: Negative for vomiting or other signifcant bowel change Genitourinary: Negative for hematuria or decreased urine volume.  Musculoskeletal: Negative for other MSK pain or swelling Skin: Negative for color change and worsening wound.  Neurological: Negative for tremors and numbness other than noted  Psychiatric/Behavioral: Negative for decreased concentration or agitation other than above       Objective:   Physical Exam BP 148/50 mmHg  Pulse 81  Temp(Src) 97.8 F (36.6 C) (Oral)  Ht 5' 6.5" (1.689 m)  Wt 124 lb 4 oz (56.359 kg)  BMI 19.76 kg/m2  SpO2 97% VS noted,  Constitutional: Pt appears well-developed, well-nourished.  HENT: Head: NCAT.  Right Ear: External ear normal.  Left Ear: External ear normal.  Eyes: . Pupils are equal, round, and reactive to light. Conjunctivae and EOM are normal Neck: Normal range of motion. Neck supple.  Cardiovascular: Normal rate and regular rhythm.   Pulmonary/Chest: Effort normal and breath sounds without rales or wheezing.  Abd:  Soft, NT, ND, + BS Neurological: Pt is alert. Not confused , motor grossly intact Skin: Skin is warm. No rash Psychiatric: Pt behavior is normal. No agitation.      Assessment & Plan:

## 2014-09-19 NOTE — Progress Notes (Signed)
Lindsay Sheppard at 09/18/2014 1:01 PM     Status: Signed       Expand All Collapse All   INR results from 09/18/2014 4.6  Finished antibiotic on 09/11/2014. Needs a call back to confirm dose and when to check

## 2014-09-19 NOTE — Assessment & Plan Note (Signed)
To cont same dose for now with cbg's in am in mid to lower 100;s, but family warned to be ready to decrease lantus if develps consistent low sugars in AM if has wt loss related to [employer]

## 2014-09-19 NOTE — Assessment & Plan Note (Signed)
Agree with family, ok to stay off the aricept for now,  to f/u any worsening symptoms or concerns

## 2014-09-19 NOTE — Assessment & Plan Note (Signed)
D/w family - ok to cont statin, as liely affords some reduction in CV risk even at her age

## 2014-09-20 ENCOUNTER — Telehealth: Payer: Self-pay | Admitting: Internal Medicine

## 2014-09-20 NOTE — Telephone Encounter (Signed)
emmi emailed °

## 2014-09-25 ENCOUNTER — Telehealth: Payer: Self-pay | Admitting: Internal Medicine

## 2014-09-25 NOTE — Telephone Encounter (Signed)
[name] with [facility] called to let us know patient's INR is 2.8. Current instructions for coumadin are: Take 3 mg daily EXCEPT 2mg  on MWF.

## 2014-09-26 NOTE — Telephone Encounter (Signed)
Called left [facility] a detailed message of all information.

## 2014-09-26 NOTE — Telephone Encounter (Signed)
Ok to cont current dose, re-check should be planned for 3 wks, and robin to help make sure pt is scheduled in future for coumadin clinic (ok to arrange with current coumadin clinic provider, as referral has already been done in past)

## 2014-09-26 NOTE — Telephone Encounter (Signed)
Not sure if action is required on this  Is the reason for the call to get a verbal on followup coumadin check?  I would like to get her back into coumadin clinic if possible

## 2014-09-26 NOTE — Telephone Encounter (Signed)
HHRN needs to know if ok to continue current dosing, when to re-check and she usually does call coumadin clinic, but they left msg to call PCP as coumadin clinic not available

## 2014-09-28 ENCOUNTER — Other Ambulatory Visit: Payer: Self-pay | Admitting: Internal Medicine

## 2014-09-28 DIAGNOSIS — Z7901 Long term (current) use of anticoagulants: Secondary | ICD-10-CM

## 2014-09-29 ENCOUNTER — Telehealth: Payer: Self-pay | Admitting: Internal Medicine

## 2014-09-29 NOTE — Telephone Encounter (Signed)
Lindsay Sheppard [phone]   Nedra Hai orders for  3 x a week for 2  weeks beginning Jan 31st.

## 2014-09-29 NOTE — Telephone Encounter (Signed)
HHRN informed 

## 2014-09-29 NOTE — Telephone Encounter (Signed)
Ok for verbal 

## 2014-10-03 ENCOUNTER — Telehealth: Payer: Self-pay

## 2014-10-03 NOTE — Telephone Encounter (Signed)
Called HHRN left a detailed message to return call regarding msg. Left on patient swelling

## 2014-10-04 ENCOUNTER — Telehealth: Payer: Self-pay | Admitting: Internal Medicine

## 2014-10-04 NOTE — Telephone Encounter (Signed)
May not be needed if does not bother the patient; so unless she complains, this can be observed for now

## 2014-10-04 NOTE — Telephone Encounter (Signed)
Spoke to the daughter and she said the swelling comes and goes (socks and shoes tight). Significant in the last few weeks and not going down as it did in the past.

## 2014-10-04 NOTE — Telephone Encounter (Signed)
Lindsay [facility]pp[facility].    Pt has new swelling in feet, what to know if they could get something for PRN just when she has swelling in her feet.

## 2014-10-05 DIAGNOSIS — D510 Vitamin B12 deficiency anemia due to intrinsic factor deficiency: Secondary | ICD-10-CM | POA: Diagnosis not present

## 2014-10-09 ENCOUNTER — Telehealth: Payer: Self-pay | Admitting: Internal Medicine

## 2014-10-09 ENCOUNTER — Ambulatory Visit (INDEPENDENT_AMBULATORY_CARE_PROVIDER_SITE_OTHER): Payer: Medicare Other | Admitting: General Practice

## 2014-10-09 ENCOUNTER — Telehealth: Payer: Self-pay | Admitting: General Practice

## 2014-10-09 DIAGNOSIS — Z5181 Encounter for therapeutic drug level monitoring: Secondary | ICD-10-CM

## 2014-10-09 LAB — POCT INR: INR: 3.7

## 2014-10-09 NOTE — Telephone Encounter (Signed)
Calling to report INR- 3.7 PT- 44.6 C/b # [phone]

## 2014-10-09 NOTE — Progress Notes (Signed)
Pre visit review using our clinic review tool, if applicable. No additional management support is needed unless otherwise documented below in the visit note. 

## 2014-10-09 NOTE — Telephone Encounter (Signed)
Left message for [name], patient's daughter to call coumadin clinic in regards to coumadin dosage.

## 2014-10-09 NOTE — Telephone Encounter (Signed)
Forwarding msg to coumadin clinic...lmb 

## 2014-10-17 ENCOUNTER — Encounter: Payer: Self-pay | Admitting: Internal Medicine

## 2014-10-18 MED ORDER — HYDRALAZINE HCL 25 MG PO TABS: 25.0000 mg | ORAL_TABLET | Freq: Three times a day (TID) | ORAL | Status: DC

## 2014-10-19 ENCOUNTER — Ambulatory Visit (INDEPENDENT_AMBULATORY_CARE_PROVIDER_SITE_OTHER): Payer: Medicare Other | Admitting: General Practice

## 2014-10-19 DIAGNOSIS — Z5181 Encounter for therapeutic drug level monitoring: Secondary | ICD-10-CM

## 2014-10-19 LAB — POCT INR: INR: 2.1

## 2014-10-19 NOTE — Progress Notes (Signed)
Pre visit review using our clinic review tool, if applicable. No additional management support is needed unless otherwise documented below in the visit note. 

## 2014-10-26 ENCOUNTER — Ambulatory Visit (INDEPENDENT_AMBULATORY_CARE_PROVIDER_SITE_OTHER): Payer: Medicare Other | Admitting: General Practice

## 2014-10-26 DIAGNOSIS — Z5181 Encounter for therapeutic drug level monitoring: Secondary | ICD-10-CM

## 2014-10-26 LAB — POCT INR: INR: 1.8

## 2014-10-26 NOTE — Progress Notes (Signed)
Pre visit review using our clinic review tool, if applicable. No additional management support is needed unless otherwise documented below in the visit note. 

## 2014-11-01 DIAGNOSIS — I129 Hypertensive chronic kidney disease with stage 1 through stage 4 chronic kidney disease, or unspecified chronic kidney disease: Secondary | ICD-10-CM

## 2014-11-02 ENCOUNTER — Ambulatory Visit (INDEPENDENT_AMBULATORY_CARE_PROVIDER_SITE_OTHER): Payer: Medicare Other | Admitting: General Practice

## 2014-11-02 DIAGNOSIS — Z5181 Encounter for therapeutic drug level monitoring: Secondary | ICD-10-CM

## 2014-11-02 LAB — POCT INR: INR: 2.1

## 2014-11-02 NOTE — Progress Notes (Signed)
Pre visit review using our clinic review tool, if applicable. No additional management support is needed unless otherwise documented below in the visit note. 

## 2014-11-16 ENCOUNTER — Ambulatory Visit (INDEPENDENT_AMBULATORY_CARE_PROVIDER_SITE_OTHER): Payer: Medicare Other | Admitting: General Practice

## 2014-11-16 DIAGNOSIS — Z5181 Encounter for therapeutic drug level monitoring: Secondary | ICD-10-CM

## 2014-11-16 LAB — POCT INR: INR: 1.8

## 2014-11-16 NOTE — Progress Notes (Signed)
Pre visit review using our clinic review tool, if applicable. No additional management support is needed unless otherwise documented below in the visit note. 

## 2014-11-30 ENCOUNTER — Ambulatory Visit (INDEPENDENT_AMBULATORY_CARE_PROVIDER_SITE_OTHER): Payer: Medicare Other | Admitting: General Practice

## 2014-11-30 DIAGNOSIS — Z5181 Encounter for therapeutic drug level monitoring: Secondary | ICD-10-CM

## 2014-11-30 LAB — POCT INR: INR: 1.5

## 2014-11-30 NOTE — Progress Notes (Signed)
Pre visit review using our clinic review tool, if applicable. No additional management support is needed unless otherwise documented below in the visit note. 

## 2014-12-05 ENCOUNTER — Ambulatory Visit (INDEPENDENT_AMBULATORY_CARE_PROVIDER_SITE_OTHER): Payer: Medicare Other | Admitting: Internal Medicine

## 2014-12-05 ENCOUNTER — Encounter: Payer: Self-pay | Admitting: Internal Medicine

## 2014-12-05 VITALS — BP 108/64 | HR 75 | Temp 98.5°F | Resp 16 | Ht 67.0 in | Wt 126.4 lb

## 2014-12-05 DIAGNOSIS — I10 Essential (primary) hypertension: Secondary | ICD-10-CM

## 2014-12-05 DIAGNOSIS — L989 Disorder of the skin and subcutaneous tissue, unspecified: Secondary | ICD-10-CM | POA: Diagnosis not present

## 2014-12-05 DIAGNOSIS — E785 Hyperlipidemia, unspecified: Secondary | ICD-10-CM

## 2014-12-05 DIAGNOSIS — R609 Edema, unspecified: Secondary | ICD-10-CM | POA: Insufficient documentation

## 2014-12-05 DIAGNOSIS — E119 Type 2 diabetes mellitus without complications: Secondary | ICD-10-CM

## 2014-12-05 MED ORDER — FUROSEMIDE 20 MG PO TABS: ORAL_TABLET | ORAL | Status: DC

## 2014-12-05 NOTE — Progress Notes (Signed)
Pre visit review using our clinic review tool, if applicable. No additional management support is needed unless otherwise documented below in the visit note. 

## 2014-12-05 NOTE — Progress Notes (Signed)
Subjective:    Patient ID: Lindsay Sheppard, female    DOB: [birth year], 81 y.o.   MRN: [id number]  HPI  Here to f/u; overall doing ok,  Pt denies chest pain, increasing sob or doe, wheezing, orthopnea, PND, palpitations, dizziness or syncope, except for mild pedal edema, has been using lasix 20 mg prn, usually only needs 1-2 times per wk..  Pt denies new neurological symptoms such as new headache, or facial or extremity weakness or numbness though does have periph neruopathy. .  Pt denies polydipsia, polyuria, or low sugar episode.   Pt denies new neurological symptoms such as new headache, or facial or extremity weakness or numbness.   Pt states overall good compliance with meds, mostly trying to follow appropriate diet, with wt overall up several lbs with supplements and ensure.INR has been more labile recently - ? Diet related Wt Readings from Last 3 Encounters:  12/05/14 126 lb 6.4 oz (57.335 kg)  09/19/14 124 lb 4 oz (56.359 kg)  09/05/14 114 lb 1.6 oz (51.755 kg)  Does have a skin lesion with rapid growth to right nasal area - asks for removal as interferes with wearin glasses and vision Past Medical History  Diagnosis Date  . POSTHERPETIC NEURALGIA 11/02/2009  . B12 DEFICIENCY 05/23/2007  . HYPERLIPIDEMIA 05/23/2007  . HYPERKALEMIA 05/30/2010  . ANEMIA-IRON DEFICIENCY 01/26/2007  . BLEPHARITIS, BILATERAL 02/23/2008  . Other specified forms of hearing loss 05/30/2010  . HYPERTENSION 01/26/2007  . Atrial fibrillation 05/23/2007  . DIASTOLIC HEART FAILURE, CHRONIC 12/14/2009  . POSTURAL HYPOTENSION 02/07/2008  . ALLERGIC RHINITIS 11/02/2009  . GERD 01/26/2007  . CONSTIPATION 01/25/2008  . SKIN LESION 05/30/2010  . BACK PAIN 08/02/2007  . OSTEOPOROSIS 05/23/2007  . SYNCOPE 02/07/2008  . FATIGUE 09/06/2007  . Dysuria 09/05/2008  . Abdominal pain, epigastric 09/06/2007  . EPIGASTRIC TENDERNESS 10/12/2007  . PULMONARY EMBOLISM, HX OF 05/23/2007  . SHINGLES, HX OF 05/23/2007  . 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 Left foot pain   . Diabetic neuropathy   . RENAL INSUFFICIENCY 07/02/2009  . RENAL CYST, LEFT 09/06/2007  . Kidney stones     "never had OR"  . Family history of anesthesia complication     "daughter has PONV; another daughter a little anesthesia lasts way too long"  . CHF (congestive heart failure) 2011  . Myocardial infarction 2011    "mild"  . DVT (deep venous thrombosis) ~ 1964    "think it was in the left"  . Aspiration pneumonia ~ 2006    "due to aspiration"  . DIABETES MELLITUS, TYPE II 09/06/2007  . DEPRESSIVE DISORDER 01/25/2008    "@ times; never treated for it"  . Melanoma of nose   . Compression fracture of lumbar vertebra     hx  . Compression fracture of thoracic vertebra 02/07/2014    "fell this am" (02/07/2014  . Arthritis     "hands" (03/15/2014)   Past Surgical History  Procedure Laterality Date  . Appendectomy  1943  . Abdominal hysterectomy  1964  . Cardiac electrophysiology mapping and ablation  1999  . Cataract extraction w/ intraocular lens  implant, bilateral  2003-2005  . Carpal tunnel release Right 2004  . Mohs surgery  2011    "removed off her nose"  . Cardiac catheterization  10/2009  . Esophageal dilation  X 2    reports that she has never smoked. She has never used smokeless tobacco. She reports that she does not drink alcohol  or use illicit drugs. family history includes Breast cancer in her mother; Colon cancer in her father; Diabetes in her sister; Hypertension in her other; Stroke in her father. Allergies  Allergen Reactions  . Amiodarone Other (See Comments)    REACTION: f? fluid retention  . Deltasone [Prednisone] Other (See Comments)    unknown  . Diltiazem Hcl Other (See Comments)    unknown  . Levofloxacin Nausea And Vomiting    REACTION: nausea  . Pregabalin Other (See Comments)    REACTION: hallucinations  . Spironolactone Other (See Comments)    REACTION: elevated K at lowest dose  . Tequin [Gatifloxacin] Other (See Comments)     Hypoglycemia  . Diazepam Anxiety and Other (See Comments)    REACTION: agitation   Current Outpatient Prescriptions on File Prior to Visit  Medication Sig Dispense Refill  . acetaminophen (TYLENOL) 325 MG tablet Take 2 tablets (650 mg total) by mouth 3 (three) times daily.    . cloNIDine (CATAPRES - DOSED IN MG/24 HR) 0.3 mg/24hr patch Place 0.3 mg onto the skin once a week.    Marland Kitchen CRANBERRY PO Take 500 mg by mouth daily.     . cyanocobalamin (,VITAMIN B-12,) 1000 MCG/ML injection INJECT 1 ML (1000 MCG) INTO THE MUSCLE EVERY 30 DAYS. 1 mL 11  . feeding supplement, GLUCERNA SHAKE, (GLUCERNA SHAKE) LIQD Take 237 mLs by mouth 2 (two) times daily between meals. (Patient taking differently: Take 237 mLs by mouth daily. )  0  . ferrous sulfate 325 (65 FE) MG tablet Take 1 tablet (325 mg total) by mouth 2 (two) times daily with a meal. 60 tablet 1  . fluticasone (FLONASE) 50 MCG/ACT nasal spray Place 2 sprays into the nose daily. 16 g 2  . hydrALAZINE (APRESOLINE) 25 MG tablet Take 1 tablet (25 mg total) by mouth 3 (three) times daily. 90 tablet 3  . Hypromellose (ARTIFICIAL TEARS OP) Place 1 drop into both eyes daily.    . insulin glargine (LANTUS) 100 UNIT/ML injection Inject 23 Units into the skin daily.     Marland Kitchen labetalol (NORMODYNE) 200 MG tablet Take 400 mg by mouth 2 (two) times daily.    . pantoprazole (PROTONIX) 40 MG tablet TAKE 1 BY MOUTH DAILY 90 tablet 3  . polyethylene glycol (MIRALAX / GLYCOLAX) packet Take 17 g by mouth 2 (two) times daily. 14 each 0  . warfarin (COUMADIN) 2 MG tablet FOLLOW INSTRUCTION PER COUMADIN CLINIC. 120 tablet 0   No current facility-administered medications on file prior to visit.   Review of Systems  Constitutional: Negative for unusual diaphoresis or night sweats HENT: Negative for ringing in ear or discharge Eyes: Negative for double vision or worsening visual disturbance.  Respiratory: Negative for choking and stridor.   Gastrointestinal: Negative for  vomiting or other signifcant bowel change Genitourinary: Negative for hematuria or change in urine volume.  Musculoskeletal: Negative for other MSK pain or swelling Skin: Negative for color change and worsening wound.  Neurological: Negative for tremors and numbness other than noted  Psychiatric/Behavioral: Negative for decreased concentration or agitation other than above       Objective:   Physical Exam BP 108/64 mmHg  Pulse 75  Temp(Src) 98.5 F (36.9 C) (Oral)  Resp 16  Ht 5\' 7"  (1.702 m)  Wt 126 lb 6.4 oz (57.335 kg)  BMI 19.79 kg/m2  SpO2 97% VS noted,  Constitutional: Pt appears in no significant distress HENT: Head: NCAT.  Right Ear: External ear normal.  Left Ear: External ear normal.  Eyes: . Pupils are equal, round, and reactive to light. Conjunctivae and EOM are normal Neck: Normal range of motion. Neck supple.  Cardiovascular: Normal rate and regular rhythm.   Pulmonary/Chest: Effort normal and breath sounds without rales or wheezing.  Abd:  Soft, NT, ND, + BS Neurological: Pt is alert. Not confused , motor grossly intact Skin: Skin is warm. No rash, trace pedal edema, has 8 mm raised ? Warty like lesion right nose, no induration or ulceration Psychiatric: Pt behavior is normal. No agitation.      Assessment & Plan:

## 2014-12-05 NOTE — Patient Instructions (Signed)
3OK for lasix 20 mg per day as needed for the swelling (to use sparingly)  Please continue all other medications as before, and refills have been done if requested.  Please have the pharmacy call with any other refills you may need.  Please continue your efforts at being more active, low cholesterol diet, and weight control.  Please keep your appointments with your specialists as you may have planned  You will be contacted regarding the referral for: dermatology ([facility])  We can hold on lab work today  Please return in 3 months, or sooner if needed

## 2014-12-05 NOTE — Assessment & Plan Note (Signed)
stable overall by history and exam, recent data reviewed with pt, and pt to continue medical treatment as before,  to f/u any worsening symptoms or concerns Lab Results  Component Value Date   LDLCALC 70 08/08/2014

## 2014-12-05 NOTE — Assessment & Plan Note (Signed)
stable overall by history and exam, recent data reviewed with pt, and pt to continue medical treatment as before,  to f/u any worsening symptoms or concerns Lab Results  Component Value Date   HGBA1C 7.0* 08/08/2014   Ok to hold on further labs today

## 2014-12-05 NOTE — Assessment & Plan Note (Signed)
?   Venous insiff, echo June 2015 low normal EF, ok for lasix 20 qd prn,  to f/u any worsening symptoms or concerns

## 2014-12-05 NOTE — Assessment & Plan Note (Signed)
stable overall by history and exam, recent data reviewed with pt, and pt to continue medical treatment as before,  to f/u any worsening symptoms or concerns BP Readings from Last 3 Encounters:  12/05/14 108/64  09/19/14 148/50  09/08/14 106/44

## 2014-12-05 NOTE — Assessment & Plan Note (Signed)
Not obvious skin ca, but will refer skin ca ctr

## 2014-12-07 ENCOUNTER — Ambulatory Visit (INDEPENDENT_AMBULATORY_CARE_PROVIDER_SITE_OTHER): Payer: Medicare Other | Admitting: General Practice

## 2014-12-07 ENCOUNTER — Telehealth: Payer: Self-pay | Admitting: Internal Medicine

## 2014-12-07 DIAGNOSIS — Z5181 Encounter for therapeutic drug level monitoring: Secondary | ICD-10-CM

## 2014-12-07 LAB — POCT INR: INR: 2.7

## 2014-12-07 NOTE — Telephone Encounter (Signed)
Lindsay Sheppard [phone] [facility]  Need to know Hemoglobin and A1c

## 2014-12-07 NOTE — Telephone Encounter (Signed)
No thanks, since it was checked in Dec 2015 at 7.0

## 2014-12-07 NOTE — Telephone Encounter (Signed)
Spoke with [name] about Pt's lab results. She wants a verbal order to check the patient's hemoglobin and a1c.

## 2014-12-07 NOTE — Progress Notes (Signed)
Pre visit review using our clinic review tool, if applicable. No additional management support is needed unless otherwise documented below in the visit note. 

## 2014-12-07 NOTE — Telephone Encounter (Signed)
Notified [name] with md response...Lindsay Sheppard

## 2014-12-13 ENCOUNTER — Encounter: Payer: Self-pay | Admitting: Internal Medicine

## 2014-12-13 MED ORDER — HYDRALAZINE HCL 25 MG PO TABS: 25.0000 mg | ORAL_TABLET | Freq: Three times a day (TID) | ORAL | Status: DC

## 2014-12-13 NOTE — Telephone Encounter (Signed)
PCC's to see pt daughter concern about a referral

## 2014-12-14 ENCOUNTER — Ambulatory Visit (INDEPENDENT_AMBULATORY_CARE_PROVIDER_SITE_OTHER): Payer: Medicare Other | Admitting: General Practice

## 2014-12-14 ENCOUNTER — Telehealth: Payer: Self-pay | Admitting: *Deleted

## 2014-12-14 DIAGNOSIS — Z5181 Encounter for therapeutic drug level monitoring: Secondary | ICD-10-CM

## 2014-12-14 LAB — POCT INR: INR: 2.4

## 2014-12-14 NOTE — Telephone Encounter (Signed)
Ok for verbal 

## 2014-12-14 NOTE — Telephone Encounter (Signed)
Notified [name] with md response...Johny Chess

## 2014-12-14 NOTE — Progress Notes (Signed)
Pre visit review using our clinic review tool, if applicable. No additional management support is needed unless otherwise documented below in the visit note. 

## 2014-12-14 NOTE — Telephone Encounter (Signed)
Left msg on triage requesting verbal order to recert home health. See pt for catheter changes, aiding, and PT check...Lindsay Sheppard

## 2014-12-26 DIAGNOSIS — Z466 Encounter for fitting and adjustment of urinary device: Secondary | ICD-10-CM

## 2014-12-28 ENCOUNTER — Ambulatory Visit (INDEPENDENT_AMBULATORY_CARE_PROVIDER_SITE_OTHER): Payer: Medicare Other | Admitting: General Practice

## 2014-12-28 DIAGNOSIS — Z5181 Encounter for therapeutic drug level monitoring: Secondary | ICD-10-CM

## 2014-12-28 LAB — POCT INR: INR: 2.3

## 2014-12-28 NOTE — Progress Notes (Signed)
Pre visit review using our clinic review tool, if applicable. No additional management support is needed unless otherwise documented below in the visit note. 

## 2015-01-03 ENCOUNTER — Other Ambulatory Visit: Payer: Self-pay | Admitting: Internal Medicine

## 2015-01-03 ENCOUNTER — Encounter: Payer: Self-pay | Admitting: Internal Medicine

## 2015-01-03 MED ORDER — DONEPEZIL HCL 5 MG PO TABS: 5.0000 mg | ORAL_TABLET | Freq: Every day | ORAL | Status: DC

## 2015-01-11 ENCOUNTER — Ambulatory Visit (INDEPENDENT_AMBULATORY_CARE_PROVIDER_SITE_OTHER): Payer: Medicare Other | Admitting: General Practice

## 2015-01-11 DIAGNOSIS — Z5181 Encounter for therapeutic drug level monitoring: Secondary | ICD-10-CM

## 2015-01-11 DIAGNOSIS — I4891 Unspecified atrial fibrillation: Secondary | ICD-10-CM

## 2015-01-11 LAB — POCT INR: INR: 2

## 2015-01-11 NOTE — Progress Notes (Signed)
Pre visit review using our clinic review tool, if applicable. No additional management support is needed unless otherwise documented below in the visit note. 

## 2015-01-18 ENCOUNTER — Other Ambulatory Visit: Payer: Self-pay | Admitting: General Practice

## 2015-01-18 DIAGNOSIS — Z7901 Long term (current) use of anticoagulants: Secondary | ICD-10-CM

## 2015-01-18 MED ORDER — WARFARIN SODIUM 2 MG PO TABS: ORAL_TABLET | ORAL | Status: DC

## 2015-01-24 ENCOUNTER — Telehealth: Payer: Self-pay | Admitting: General Practice

## 2015-01-24 ENCOUNTER — Ambulatory Visit (INDEPENDENT_AMBULATORY_CARE_PROVIDER_SITE_OTHER): Payer: Medicare Other | Admitting: General Practice

## 2015-01-24 DIAGNOSIS — Z5181 Encounter for therapeutic drug level monitoring: Secondary | ICD-10-CM

## 2015-01-24 DIAGNOSIS — I4891 Unspecified atrial fibrillation: Secondary | ICD-10-CM

## 2015-01-24 LAB — POCT INR: INR: 2.6

## 2015-01-24 LAB — HM DIABETES EYE EXAM

## 2015-01-24 NOTE — Telephone Encounter (Signed)
LMOVM

## 2015-01-24 NOTE — Progress Notes (Signed)
I have reviewed and agree with the plan. 

## 2015-01-24 NOTE — Progress Notes (Signed)
Pre visit review using our clinic review tool, if applicable. No additional management support is needed unless otherwise documented below in the visit note. 

## 2015-02-01 ENCOUNTER — Encounter: Payer: Self-pay | Admitting: Internal Medicine

## 2015-02-01 LAB — HM DIABETES EYE EXAM

## 2015-02-08 ENCOUNTER — Ambulatory Visit (INDEPENDENT_AMBULATORY_CARE_PROVIDER_SITE_OTHER): Payer: Medicare Other | Admitting: General Practice

## 2015-02-08 DIAGNOSIS — Z5181 Encounter for therapeutic drug level monitoring: Secondary | ICD-10-CM

## 2015-02-08 DIAGNOSIS — I4891 Unspecified atrial fibrillation: Secondary | ICD-10-CM

## 2015-02-08 LAB — POCT INR: INR: 1.9

## 2015-02-08 NOTE — Progress Notes (Signed)
Pre visit review using our clinic review tool, if applicable. No additional management support is needed unless otherwise documented below in the visit note. 

## 2015-02-14 ENCOUNTER — Telehealth: Payer: Self-pay | Admitting: Internal Medicine

## 2015-02-14 NOTE — Telephone Encounter (Signed)
Verbal given to [facility]

## 2015-02-14 NOTE — Telephone Encounter (Signed)
Ok with me 

## 2015-02-14 NOTE — Telephone Encounter (Signed)
Lindsay Sheppard from [place] called regarding patient hit her arm and has a skin tear that's about 3 cm x 1 cm and she is wondering if she can treat with zero form. Lindsay Sheppard's # is [phone]

## 2015-02-22 ENCOUNTER — Ambulatory Visit (INDEPENDENT_AMBULATORY_CARE_PROVIDER_SITE_OTHER): Payer: Medicare Other | Admitting: General Practice

## 2015-02-22 DIAGNOSIS — I4891 Unspecified atrial fibrillation: Secondary | ICD-10-CM

## 2015-02-22 DIAGNOSIS — Z5181 Encounter for therapeutic drug level monitoring: Secondary | ICD-10-CM

## 2015-02-22 LAB — POCT INR: INR: 2.2

## 2015-02-22 NOTE — Progress Notes (Signed)
Pre visit review using our clinic review tool, if applicable. No additional management support is needed unless otherwise documented below in the visit note. 

## 2015-02-26 ENCOUNTER — Other Ambulatory Visit: Payer: Self-pay

## 2015-02-26 NOTE — Patient Outreach (Signed)
[facility]) Care Management  02/26/2015  Lindsay Sheppard 04/14/1921 [id number]   Referral from [place] [facility], assigned Mariann Laster, RN to outreach.  [name]. [place], [place] Management [place] Assistant Phone: [phone] Fax: [phone]

## 2015-03-02 ENCOUNTER — Other Ambulatory Visit: Payer: Self-pay

## 2015-03-02 NOTE — Patient Outreach (Signed)
[facility]) Care Management  03/02/2015  Lindsay Sheppard 08-Dec-1920 [id number]   Telephonic Care Management Note  Referral Date:  02/26/15 Referral Source: MD Referral Issue:  CHF and h/o 4 admissions Triage Screening Date: pending  Triage outreach call #1.  Patient not reached.  RN CM left HIPAA compliant voice message with name and contact information.  RN CM will reschedule for next outreach attempt within 1-2 weeks.   Mariann Laster, RN, BSN, [facility], CCM  [employer] Management Coordinator [phone] Office [phone] Direct [phone] Cell

## 2015-03-06 ENCOUNTER — Ambulatory Visit (INDEPENDENT_AMBULATORY_CARE_PROVIDER_SITE_OTHER): Payer: Medicare Other | Admitting: Internal Medicine

## 2015-03-06 ENCOUNTER — Encounter: Payer: Self-pay | Admitting: Internal Medicine

## 2015-03-06 ENCOUNTER — Other Ambulatory Visit (INDEPENDENT_AMBULATORY_CARE_PROVIDER_SITE_OTHER): Payer: Medicare Other

## 2015-03-06 ENCOUNTER — Other Ambulatory Visit: Payer: Self-pay | Admitting: Internal Medicine

## 2015-03-06 VITALS — BP 98/60 | HR 69 | Temp 97.7°F | Wt 121.0 lb

## 2015-03-06 DIAGNOSIS — E119 Type 2 diabetes mellitus without complications: Secondary | ICD-10-CM

## 2015-03-06 DIAGNOSIS — I10 Essential (primary) hypertension: Secondary | ICD-10-CM

## 2015-03-06 DIAGNOSIS — E785 Hyperlipidemia, unspecified: Secondary | ICD-10-CM

## 2015-03-06 DIAGNOSIS — F0390 Unspecified dementia without behavioral disturbance: Secondary | ICD-10-CM | POA: Diagnosis not present

## 2015-03-06 LAB — CBC WITH DIFFERENTIAL/PLATELET
BASOS PCT: 0.2 % (ref 0.0–3.0)
Basophils Absolute: 0 10*3/uL (ref 0.0–0.1)
Eosinophils Absolute: 0.1 10*3/uL (ref 0.0–0.7)
Eosinophils Relative: 2.7 % (ref 0.0–5.0)
HEMATOCRIT: 36.1 % (ref 36.0–46.0)
HEMOGLOBIN: 11.9 g/dL — AB (ref 12.0–15.0)
LYMPHS PCT: 14.6 % (ref 12.0–46.0)
Lymphs Abs: 0.6 10*3/uL — ABNORMAL LOW (ref 0.7–4.0)
MCHC: 32.9 g/dL (ref 30.0–36.0)
MCV: 82.3 fl (ref 78.0–100.0)
MONO ABS: 0.4 10*3/uL (ref 0.1–1.0)
MONOS PCT: 8.1 % (ref 3.0–12.0)
NEUTROS ABS: 3.3 10*3/uL (ref 1.4–7.7)
Neutrophils Relative %: 74.4 % (ref 43.0–77.0)
Platelets: 181 10*3/uL (ref 150.0–400.0)
RBC: 4.38 Mil/uL (ref 3.87–5.11)
RDW: 19.3 % — ABNORMAL HIGH (ref 11.5–15.5)
WBC: 4.4 10*3/uL (ref 4.0–10.5)

## 2015-03-06 LAB — HEMOGLOBIN A1C: HEMOGLOBIN A1C: 6 % (ref 4.6–6.5)

## 2015-03-06 LAB — LIPID PANEL
CHOLESTEROL: 115 mg/dL (ref 0–200)
HDL: 35.3 mg/dL — ABNORMAL LOW (ref >39.00)
LDL CALC: 60 mg/dL (ref 0–99)
NonHDL: 79.7
Total CHOL/HDL Ratio: 3
Triglycerides: 97 mg/dL (ref 0.0–149.0)
VLDL: 19.4 mg/dL (ref 0.0–40.0)

## 2015-03-06 LAB — URINALYSIS, ROUTINE W REFLEX MICROSCOPIC
BILIRUBIN URINE: NEGATIVE
Hgb urine dipstick: NEGATIVE
Ketones, ur: NEGATIVE
Nitrite: POSITIVE — AB
RBC / HPF: NONE SEEN (ref 0–?)
Specific Gravity, Urine: 1.025 (ref 1.000–1.030)
Total Protein, Urine: 30 — AB
UROBILINOGEN UA: 0.2 (ref 0.0–1.0)
Urine Glucose: NEGATIVE
pH: 5.5 (ref 5.0–8.0)

## 2015-03-06 LAB — BASIC METABOLIC PANEL
BUN: 36 mg/dL — ABNORMAL HIGH (ref 6–23)
CHLORIDE: 106 meq/L (ref 96–112)
CO2: 26 meq/L (ref 19–32)
Calcium: 9.6 mg/dL (ref 8.4–10.5)
Creatinine, Ser: 1.17 mg/dL (ref 0.40–1.20)
GFR: 46.01 mL/min — ABNORMAL LOW (ref >60.00)
GLUCOSE: 206 mg/dL — AB (ref 70–99)
Potassium: 4.4 mEq/L (ref 3.5–5.1)
SODIUM: 139 meq/L (ref 135–145)

## 2015-03-06 LAB — HEPATIC FUNCTION PANEL
ALBUMIN: 3.5 g/dL (ref 3.5–5.2)
ALT: 14 U/L (ref 0–35)
AST: 16 U/L (ref 0–37)
Alkaline Phosphatase: 51 U/L (ref 39–117)
BILIRUBIN TOTAL: 0.4 mg/dL (ref 0.2–1.2)
Bilirubin, Direct: 0.1 mg/dL (ref 0.0–0.3)
Total Protein: 5.9 g/dL — ABNORMAL LOW (ref 6.0–8.3)

## 2015-03-06 LAB — MICROALBUMIN / CREATININE URINE RATIO
CREATININE, U: 96.1 mg/dL
Microalb Creat Ratio: 15 mg/g (ref 0.0–30.0)
Microalb, Ur: 14.4 mg/dL — ABNORMAL HIGH (ref 0.0–1.9)

## 2015-03-06 LAB — TSH: TSH: 1.51 u[IU]/mL (ref 0.35–4.50)

## 2015-03-06 MED ORDER — CEPHALEXIN 500 MG PO CAPS: 500.0000 mg | ORAL_CAPSULE | Freq: Four times a day (QID) | ORAL | Status: DC

## 2015-03-06 MED ORDER — INSULIN GLARGINE 100 UNIT/ML ~~LOC~~ SOLN: 20.0000 [IU] | Freq: Every day | SUBCUTANEOUS | Status: DC

## 2015-03-06 NOTE — Assessment & Plan Note (Signed)
stable overall by history and exam, recent data reviewed with pt, and pt to continue medical treatment as before,  to f/u any worsening symptoms or concerns BP Readings from Last 3 Encounters:  03/06/15 98/60  12/05/14 108/64  09/19/14 148/50

## 2015-03-06 NOTE — Progress Notes (Signed)
Subjective:    Patient ID: Lindsay Sheppard, female    DOB: 1921/08/20, 81 y.o.   MRN: [id number]  HPI  Here for yearly  f/u;  Overall doing ok;  Pt denies Chest pain, worsening SOB, DOE, wheezing, orthopnea, PND, worsening LE edema, palpitations, dizziness or syncope.  Pt denies neurological change such as new headache, facial or extremity weakness.  Pt denies polydipsia, polyuria, or low sugar symptoms. Pt states overall good compliance with treatment and medications, good tolerability, and has been trying to follow appropriate diet.  Pt denies worsening depressive symptoms, suicidal ideation or panic. No fever, night sweats, wt loss, loss of appetite, or other constitutional symptoms.  Pt states good ability with ADL's, has low fall risk, home safety reviewed and adequate, no other significant changes in hearing or vision. Denies worsening reflux, abd pain, dysphagia, n/v, bowel change or blood, despite known right sided colon mass.  Pt continues to have recurring left LBP without change in severity, bowel or bladder change, fever, wt loss,  worsening LE pain/weakness, gait change or falls.   Gets b12 shots monthly at home.  Last INR 2.2 on June 23.  Has some allergic conjunctival symptoms worse in past few weeks - has not tried OTC, doesnot want more rx meds if possible, plans to try otc zaditor.  Dementia overall stable symptomatically, and not assoc with behavioral changes such as hallucinations, paranoia, or agitation.  Uses walker at home all the time, can walk room to room.  Lives at hoem with 24/7 supervision -= 2 daughters.  Past Medical History  Diagnosis Date  . POSTHERPETIC NEURALGIA 11/02/2009  . B12 DEFICIENCY 05/23/2007  . HYPERLIPIDEMIA 05/23/2007  . HYPERKALEMIA 05/30/2010  . ANEMIA-IRON DEFICIENCY 01/26/2007  . BLEPHARITIS, BILATERAL 02/23/2008  . Other specified forms of hearing loss 05/30/2010  . HYPERTENSION 01/26/2007  . Atrial fibrillation 05/23/2007  . DIASTOLIC HEART FAILURE,  CHRONIC 12/14/2009  . POSTURAL HYPOTENSION 02/07/2008  . ALLERGIC RHINITIS 11/02/2009  . GERD 01/26/2007  . CONSTIPATION 01/25/2008  . SKIN LESION 05/30/2010  . BACK PAIN 08/02/2007  . OSTEOPOROSIS 05/23/2007  . SYNCOPE 02/07/2008  . FATIGUE 09/06/2007  . Dysuria 09/05/2008  . Abdominal pain, epigastric 09/06/2007  . EPIGASTRIC TENDERNESS 10/12/2007  . PULMONARY EMBOLISM, HX OF 05/23/2007  . SHINGLES, HX OF 05/23/2007  . 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 Left foot pain   . Diabetic neuropathy   . RENAL INSUFFICIENCY 07/02/2009  . RENAL CYST, LEFT 09/06/2007  . Kidney stones     "never had OR"  . Family history of anesthesia complication     "daughter has PONV; another daughter a little anesthesia lasts way too long"  . CHF (congestive heart failure) 2011  . Myocardial infarction 2011    "mild"  . DVT (deep venous thrombosis) ~ 1964    "think it was in the left"  . Aspiration pneumonia ~ 2006    "due to aspiration"  . DIABETES MELLITUS, TYPE II 09/06/2007  . DEPRESSIVE DISORDER 01/25/2008    "@ times; never treated for it"  . Melanoma of nose   . Compression fracture of lumbar vertebra     hx  . Compression fracture of thoracic vertebra 02/07/2014    "fell this am" (02/07/2014  . Arthritis     "hands" (03/15/2014)   Past Surgical History  Procedure Laterality Date  . Appendectomy  1943  . Abdominal hysterectomy  1964  . Cardiac electrophysiology mapping and ablation  1999  . Cataract extraction w/  intraocular lens  implant, bilateral  2003-2005  . Carpal tunnel release Right 2004  . Mohs surgery  2011    "removed off her nose"  . Cardiac catheterization  10/2009  . Esophageal dilation  X 2    reports that she has never smoked. She has never used smokeless tobacco. She reports that she does not drink alcohol or use illicit drugs. family history includes Breast cancer in her mother; Colon cancer in her father; Diabetes in her sister; Hypertension in her other; Stroke in her father. Allergies    Allergen Reactions  . Amiodarone Other (See Comments)    REACTION: f? fluid retention  . Deltasone [Prednisone] Other (See Comments)    unknown  . Diltiazem Hcl Other (See Comments)    unknown  . Levofloxacin Nausea And Vomiting    REACTION: nausea  . Pregabalin Other (See Comments)    REACTION: hallucinations  . Spironolactone Other (See Comments)    REACTION: elevated K at lowest dose  . Tequin [Gatifloxacin] Other (See Comments)    Hypoglycemia  . Diazepam Anxiety and Other (See Comments)    REACTION: agitation   Current Outpatient Prescriptions on File Prior to Visit  Medication Sig Dispense Refill  . acetaminophen (TYLENOL) 325 MG tablet Take 2 tablets (650 mg total) by mouth 3 (three) times daily.    . cloNIDine (CATAPRES - DOSED IN MG/24 HR) 0.3 mg/24hr patch Place 0.3 mg onto the skin once a week.    . cloNIDine (CATAPRES - DOSED IN MG/24 HR) 0.3 mg/24hr patch APPLY 1 PATCH ONTO THE SKIN EVERY 7 DAYS 12 patch 3  . CRANBERRY PO Take 500 mg by mouth daily.     . cyanocobalamin (,VITAMIN B-12,) 1000 MCG/ML injection INJECT 1 ML (1000 MCG) INTO THE MUSCLE EVERY 30 DAYS. 1 mL 11  . donepezil (ARICEPT) 5 MG tablet Take 1 tablet (5 mg total) by mouth at bedtime. 90 tablet 3  . feeding supplement, GLUCERNA SHAKE, (GLUCERNA SHAKE) LIQD Take 237 mLs by mouth 2 (two) times daily between meals. (Patient taking differently: Take 237 mLs by mouth daily. )  0  . ferrous sulfate 325 (65 FE) MG tablet Take 1 tablet (325 mg total) by mouth 2 (two) times daily with a meal. 60 tablet 1  . fluticasone (FLONASE) 50 MCG/ACT nasal spray Place 2 sprays into the nose daily. 16 g 2  . furosemide (LASIX) 20 MG tablet 1 tab by mouth per day as needed for swelling 90 tablet 3  . hydrALAZINE (APRESOLINE) 25 MG tablet Take 1 tablet (25 mg total) by mouth 3 (three) times daily. 270 tablet 3  . Hypromellose (ARTIFICIAL TEARS OP) Place 1 drop into both eyes daily.    Marland Kitchen labetalol (NORMODYNE) 200 MG tablet Take  400 mg by mouth 2 (two) times daily.    . pantoprazole (PROTONIX) 40 MG tablet TAKE 1 BY MOUTH DAILY 90 tablet 3  . polyethylene glycol (MIRALAX / GLYCOLAX) packet Take 17 g by mouth 2 (two) times daily. 14 each 0  . pravastatin (PRAVACHOL) 40 MG tablet TAKE 1/2 BY MOUTH DAILY 45 tablet 3  . warfarin (COUMADIN) 2 MG tablet Take as directed by anticoagulation clinic 120 tablet 3   No current facility-administered medications on file prior to visit.    Review of Systems Constitutional: Negative for increased diaphoresis, other activity, appetite or siginficant weight change other than noted HENT: Negative for worsening hearing loss, ear pain, facial swelling, mouth sores and neck stiffness.  Eyes: Negative for other worsening pain, redness or visual disturbance.  Respiratory: Negative for shortness of breath and wheezing  Cardiovascular: Negative for chest pain and palpitations.  Gastrointestinal: Negative for diarrhea, blood in stool, abdominal distention or other pain Genitourinary: Negative for hematuria, flank pain or change in urine volume.  Musculoskeletal: Negative for myalgias or other joint complaints.  Skin: Negative for color change and wound or drainage.  Neurological: Negative for syncope and numbness. other than noted Hematological: Negative for adenopathy. or other swelling Psychiatric/Behavioral: Negative for hallucinations, SI, self-injury, decreased concentration or other worsening agitation.      Objective:   Physical Exam BP 98/60 mmHg  Pulse 69  Temp(Src) 97.7 F (36.5 C) (Oral)  Wt 121 lb (54.885 kg)  SpO2 97% VS noted,  Constitutional: Pt is oriented to person, place, and time. Appears well-developed and well-nourished, in no significant distress Head: Normocephalic and atraumatic.  Right Ear: External ear normal.  Left Ear: External ear normal.  Nose: Nose normal.  Mouth/Throat: Oropharynx is clear and moist.  Eyes: Conjunctivae and EOM are normal. Pupils  are equal, round, and reactive to light.  Neck: Normal range of motion. Neck supple. No JVD present. No tracheal deviation present or significant neck LA or mass Cardiovascular: Normal rate, regular rhythm, normal heart sounds and intact distal pulses.   Pulmonary/Chest: Effort normal and breath sounds without rales or wheezing  Abdominal: Soft. Bowel sounds are normal. NT. No HSM  Musculoskeletal: Normal range of motion. Exhibits no edema.  Lymphadenopathy:  Has no cervical adenopathy.  Neurological: Pt is alert and oriented to person, place, and time. Pt has normal reflexes. No cranial nerve deficit. Motor grossly intact Skin: Skin is warm and dry. No rash noted.  Psychiatric:  Has normal mood and affect. Behavior is normal.     Assessment & Plan:

## 2015-03-06 NOTE — Assessment & Plan Note (Signed)
stable overall by history and exam, recent data reviewed with pt, and pt to continue medical treatment as before,  to f/u any worsening symptoms or concerns Lab Results  Component Value Date   WBC 5.3 09/06/2014   HGB 12.5 09/06/2014   HCT 39.5 09/06/2014   PLT 183 09/06/2014   GLUCOSE 87 09/07/2014   CHOL 123 08/08/2014   TRIG 68.0 08/08/2014   HDL 39.70 08/08/2014   LDLCALC 70 08/08/2014   ALT 22 09/05/2014   AST 23 09/05/2014   NA 135 09/07/2014   K 4.0 09/07/2014   CL 104 09/07/2014   CREATININE 1.11* 09/07/2014   BUN 20 09/07/2014   CO2 26 09/07/2014   TSH 1.82 08/08/2014   INR 2.2 02/22/2015   HGBA1C 7.0* 08/08/2014   MICROALBUR 2.5* 06/03/2011

## 2015-03-06 NOTE — Progress Notes (Signed)
Pre visit review using our clinic review tool, if applicable. No additional management support is needed unless otherwise documented below in the visit note. 

## 2015-03-06 NOTE — Assessment & Plan Note (Signed)
stable overall by history and exam, recent data reviewed with pt, and pt to continue medical treatment as before,  to f/u any worsening symptoms or concerns Lab Results  Component Value Date   HGBA1C 7.0* 08/08/2014

## 2015-03-06 NOTE — Patient Instructions (Signed)
Please continue all other medications as before, and refills have been done if requested.  Please have the pharmacy call with any other refills you may need.  Please continue your efforts at being more active, low cholesterol diet, and weight control.  You are otherwise up to date with prevention measures today.  Please keep your appointments with your specialists as you may have planned  Please go to the LAB in the Basement (turn left off the elevator) for the tests to be done today  You will be contacted by phone if any changes need to be made immediately.  Otherwise, you will receive a letter about your results with an explanation, but please check with MyChart first.  Please remember to sign up for MyChart if you have not done so, as this will be important to you in the future with finding out test results, communicating by private email, and scheduling acute appointments online when needed.  You will be contacted by phone if any changes need to be made immediately.  Otherwise, you will receive a letter about your results with an explanation, but please check with MyChart first.  Please remember to sign up for MyChart if you have not done so, as this will be important to you in the future with finding out test results, communicating by private email, and scheduling acute appointments online when needed.  Please return in 6 months, or sooner if needed

## 2015-03-06 NOTE — Assessment & Plan Note (Signed)
stable overall by history and exam, recent data reviewed with pt, and pt to continue medical treatment as before,  to f/u any worsening symptoms or concerns Lab Results  Component Value Date   LDLCALC 70 08/08/2014   For f/u labs today

## 2015-03-08 ENCOUNTER — Ambulatory Visit (INDEPENDENT_AMBULATORY_CARE_PROVIDER_SITE_OTHER): Payer: Medicare Other | Admitting: General Practice

## 2015-03-08 DIAGNOSIS — I4891 Unspecified atrial fibrillation: Secondary | ICD-10-CM

## 2015-03-08 DIAGNOSIS — Z5181 Encounter for therapeutic drug level monitoring: Secondary | ICD-10-CM

## 2015-03-08 LAB — POCT INR: INR: 2

## 2015-03-08 NOTE — Progress Notes (Signed)
Pre visit review using our clinic review tool, if applicable. No additional management support is needed unless otherwise documented below in the visit note. 

## 2015-03-15 ENCOUNTER — Other Ambulatory Visit: Payer: Self-pay

## 2015-03-15 NOTE — Patient Outreach (Signed)
[facility]) Care Management  03/15/2015  Lindsay Sheppard May 28, 1921 [id number]   Telephonic Care Management Note  Referral Date: 02/26/15 Referral Source:MD Referral Issue: CHF and H/O 4 admissions Triage Screening Date: pending [facility] Provider:  [facility]   Triage outreach call #2.  RN CM received voice message from daughter, Lindsay Sheppard, [phone].   RN CM left HIPAA compliant voice message with name and contact information.  RN CM will reschedule for next outreach attempt within 1-2 weeks.   Lindsay Laster, RN, BSN, [facility], CCM  [employer] Management Coordinator [phone] Office [phone] Direct [phone] Cell

## 2015-03-22 ENCOUNTER — Ambulatory Visit (INDEPENDENT_AMBULATORY_CARE_PROVIDER_SITE_OTHER): Payer: Medicare Other | Admitting: General Practice

## 2015-03-22 ENCOUNTER — Telehealth: Payer: Self-pay

## 2015-03-22 ENCOUNTER — Other Ambulatory Visit: Payer: Self-pay

## 2015-03-22 ENCOUNTER — Ambulatory Visit: Payer: Self-pay

## 2015-03-22 ENCOUNTER — Telehealth: Payer: Self-pay | Admitting: Internal Medicine

## 2015-03-22 DIAGNOSIS — Z5181 Encounter for therapeutic drug level monitoring: Secondary | ICD-10-CM

## 2015-03-22 DIAGNOSIS — I4891 Unspecified atrial fibrillation: Secondary | ICD-10-CM

## 2015-03-22 LAB — POCT INR: INR: 2.1

## 2015-03-22 NOTE — Patient Outreach (Signed)
[facility]) Care Management  03/22/2015  Lindsay Sheppard [birth year] [id number]   RN CM notified [place] Assistant of case status update:  Active effective date 03/22/15 level [place], RN, BSN, [facility], [place] Management Care Management Coordinator [phone] Office [phone] Direct [phone] Cell

## 2015-03-22 NOTE — Patient Outreach (Signed)
[facility]) Care Management  03/22/2015  TACARRA JUSTO 30-Jan-1921 [id number]  RN CM contacted Dr. Gwynn Burly office contact to notify of Epic in-basked message update.  RN CM advised to please contact primary caregiver via telephone rather than My Chart as daughter states she is not having time to get on the computer.     Hi Dr. Jenny Reichmann,  Spoke with patient's daughter, [name] this morning.   Question:  Daughter reported patient was treated for a UTI on last office appt 01/2015 Is it possible this event "blacking out" could be related to a possible unresolved UTI?    Patient is active with [facility] who could recheck U/A if you feel it may be warranted.    Falls:  Yes (1) on 03/21/15 while ambulating with walker and daughter's assistance to bathroom.  Patient was in the bathroom and "blacked out."    Daughter was able to catch patient and [facility] CNA was arriving to the home to provide care and was able to assist with event.  Daughter states this has never happened to patient before.  States she feels may have occurred due to possible hydration issues due to patient does not drink enough fluids.  BP check 138/"something" and BS 138 at time of fall.  BP dropped to 99/50 after getting patient back to the chair and calmed following the event.  States normal BP range of 140/70--75.  States patient was feeling very tired and was pale.  CNA notified [place] SN who spoke with patient via telephone assessment and advised family of no apparent signs or symptoms of stroke.  Daughter reports most recent MD office weight 122 lbs.  Daughter logs BP and BS readings daily.  BS checks every morning or as needed.  States last MD office weight: 30 Daughter confirmed patient is not taking any Lasix at this time. Patient's next appt with you is not until 09/2015 per daughter.  RN CM provided education -hydration tips -management of orthostatic hypotension -fall prevention  Thanks,  Lance Morin, RN, BSN, [facility], CCM  [employer] Management Coordinator [phone] Office [phone] Direct [phone] Cell

## 2015-03-22 NOTE — Patient Outreach (Signed)
[facility]) Care Management  03/22/2015  Lindsay Sheppard [birth year] [id number]  Telephonic Care Management Note   Triage / Screening completed with Lindsay Sheppard, (patient's daughter)  [phone]. (Per Epic MR:  Patient signed a Environmental consultant to allow her daughters, Lindsay Sheppard and Lindsay Sheppard to have access to her medical records/information).  Referral Date: 02/26/15 Referral Source:MD Referral Issue: CHF and H/O 4 admissions Triage Screening Date: 03/22/15 PCP:  Dr. Biagio Borg, [phone] - last appt 01/2015 and next appt 09/2015.  Optometrist:  03/2015 regular appt.  Retina Specialist: Dr. Zadie Rhine  [facility] Provider: [name] (Active) SN, CNA services  Social Patient lives in her home and needs 24/7 assistance with care and ambulation.  Patient ambulates with walker.   Caregivers:  Daughter, Lindsay Sheppard and Daughter, Lindsay Sheppard Falls:  Yes (1) on 03/21/15 while ambulating with walker and daughter's assistance to bathroom.  Patient was in the bathroom and "blacked out."    Daughter was able to catch patient and [facility] CNA was arriving to the home to provide care and was able to assist with event.  Daughter states this has never happened to patient before.  States she feels Sheppard have occurred due to possible hydration issues due to patient does not drink enough fluids.  BP check 138/"something" and BS 138 at time of fall.  BP dropped to 99/50 after getting patient back to the chair and calmed following the event.  States normal BP range of 140/70--75.  States patient was feeling very tired and was pale.  CNA notified [place] SN who spoke with patient via telephone assessment and advised family of no apparent signs or symptoms of stroke.  Daughter reports most recent MD office weight 122 lbs.  Daughter logs BP and BS readings daily.  BS checks every morning or as needed.  States last MD office weight: 2 Daughter confirmed patient is not taking any Lasix at  this time.  Transportation:  Yes provided by daughters.  Advanced Directives:  Lindsay Sheppard, Daughter states they need to review all documents again and has been a while since they have done so.  RN CM advised available for assistance and will discuss again on initial assessment.   Admissions: H/O 4 hospital admissions over the past year.  Last admission date 09/2013 for UTI.  States H/O several admissions associated to UTI.    Urology Chronic Foley  Daughter reports UTI + and treated on Primary MD office appt 01/2015.  Medications 10 + medications managed and administered via caregivers, (Patients daughters). Daughter confirmed patient is not taking any Lasix at this time.  Patient compliant with all prescribed medications.   Consent (Per Epic MR:  Patient signed a Designated Party Release to allow her daughters, Lindsay Sheppard and Lindsay Sheppard to have access to her medical records/information). Daughter agreed and consents to [facility] telephonic services.   RN CM provided introduction to services.  RN CM instructed daughter that RN CM will mail introductory package to patient and caregivers.  RN CM instructed in completion of consent form and return to [place] encouraged patient to contact RN CM for any assistance needed with care coordination needs.   RN CM advised to please notify MD of any changes in her condition prior to scheduled appt's.   RN CM provided contact name and #, 24-hour nurse line # [phone].   RN CM confirmed patient is aware of 911 services for urgent emergency needs.  Plan:   RN CM  will reschedule for Initial Assessment within 30 days.  RN CM advised daughter that RN CM will notify PCP of this event.   Lindsay Laster, RN, BSN, [facility], CCM  [employer] Management Coordinator [phone] Office [phone] Direct [phone] Cell

## 2015-03-22 NOTE — Telephone Encounter (Signed)
Lindsay Sheppard from [place] care [phone]) is calling just to let you know she put a message into the provider case mgmt services. Also, daughter is not able to use Mychart so she will not be able to get messages from there.

## 2015-03-22 NOTE — Patient Outreach (Signed)
[facility]) Care Management  03/22/2015  Lindsay Sheppard 06/09/1921 [id number]   [facility] Introductory letter sent to patient and caregivers.   Mariann Laster, RN, BSN, [facility], CCM  [employer] Management Coordinator [phone] Office [phone] Direct [phone] Cell

## 2015-03-22 NOTE — Progress Notes (Signed)
Pre visit review using our clinic review tool, if applicable. No additional management support is needed unless otherwise documented below in the visit note. 

## 2015-04-05 ENCOUNTER — Other Ambulatory Visit: Payer: Self-pay

## 2015-04-05 DIAGNOSIS — I5032 Chronic diastolic (congestive) heart failure: Secondary | ICD-10-CM

## 2015-04-05 NOTE — Patient Outreach (Signed)
[facility]) Care Management  04/05/2015  Lindsay Sheppard 05-29-1921 [id number]   Telephonic Care Management Note   Referral Date: 02/26/15 Referral Source:MD Referral Issue: CHF and H/O 4 admissions Triage Screening Date: 03/22/15 PCP: Dr. Biagio Sheppard, [phone] - last appt 01/2015 and next appt 09/2015.  Optometrist: 03/2015 regular appt.  Retina Specialist: Dr. Zadie Sheppard  [facility] Provider: [place] (Active) SN, CNA services  Contact call completed with Lindsay Sheppard, (patient's daughter) [phone]. (Per Epic MR: Patient signed a Environmental consultant to allow her daughters, Lindsay Sheppard and Lindsay Sheppard to have access to her medical records/information).  Social Patient lives in her home and needs 24/7 assistance with care and ambulation. Patient ambulates with walker.  Caregivers: Daughter, Lindsay Sheppard and Daughter, Lindsay Sheppard Falls: Yes (1) on 03/21/15 while ambulating with walker and daughter's assistance to bathroom. Patient was in the bathroom and "blacked out." Daughter was able to catch patient and [facility] CNA was arriving to the home to provide care and was able to assist with event. Daughter states this has never happened to patient before. States she feels Sheppard have occurred due to possible hydration issues due to patient does not drink enough fluids. BP check 138/"something" and BS 138 at time of fall. BP dropped to 99/50 after getting patient back to the chair and calmed following the event. States normal BP range of 140/70--75. Daughter reported that Patient was feeling very tired and was pale. CNA was notified [facility] SN who spoke with patient via telephone assessment and advised family of no apparent signs or symptoms of stroke.   (RN CM notified Dr. Jenny Sheppard of this event via Epic in-basket on 03/22/15).  Daughter states no new recommendations received from Dr. Jenny Sheppard and no more events noted. (RESOLVED) Transportation: Yes provided by  daughters.  Advanced Directives: Lindsay Sheppard, Daughter states they need to review all documents again and has been a while since they have done so.   CHF Daughter reports no new events since last contact call on 03/03/15 Daughter reports most recent MD office weight 122 lbs. States unable to weigh patient at home due to patient is too shaky and high risk for fall while attempting to weight.  [place] nurse is also not able to weigh patient for the say reason.  Daughter logs BP readings. Daughter confirmed patient is not taking any Lasix at this time and issues with keeping patient hydrating.   Diabetes Type II  BS checks every morning or as needed. Daughter logs BS readings daily.  Urology Chronic Foley  Daughter reports UTI + and treated on Primary MD office appt 01/2015.  H/O 4 hospital admissions over the past year. Last admission date 09/2013 for UTI.  States H/O several admissions associated to UTI.   Medications 10 + medications managed and administered via caregivers, (Patients daughters). Daughter confirmed patient is not taking any Lasix at this time.  Patient compliant with all prescribed medications.   Consent (Per Epic MR: Patient signed a Designated Party Release to allow her daughters, Lindsay Sheppard and Lindsay Sheppard to have access to her medical records/information). Daughter agreed and consents to [facility] telephonic services.  RN CM provided introduction to services.  RN CM instructed daughter that RN CM will mail introductory package to patient and caregivers (03/22/15).  RN CM instructed in completion of consent form and return to [place] encouraged patient to contact RN CM for any assistance needed with care coordination needs.  RN CM advised  to please notify MD of any changes in her condition prior to scheduled appt's.  RN CM provided contact name and #, 24-hour nurse line # [phone].  RN CM confirmed patient is aware of 911 services for urgent  emergency needs.  Plan:  RN identifies no further needs for Level 3 Care Coordination services and patient more appropriate for Disease Management services.    RN CM referred to [facility] Disease Management Program for CHF education.   Lindsay Laster, RN, BSN, [facility], CCM  [employer] Management Coordinator [phone] Direct [phone] Cell [phone] Office [phone] Fax

## 2015-04-05 NOTE — Patient Outreach (Signed)
[facility]) Care Management  04/05/2015  Lindsay Sheppard [birth year] [id number]   Request from Mariann Laster, RN to assign Powers, Jon Billings, Rn assigned.  [name]. [place], [place] Management [place] Assistant Phone: [phone] Fax: [phone]

## 2015-04-09 ENCOUNTER — Ambulatory Visit (INDEPENDENT_AMBULATORY_CARE_PROVIDER_SITE_OTHER): Payer: Medicare Other | Admitting: General Practice

## 2015-04-09 DIAGNOSIS — Z5181 Encounter for therapeutic drug level monitoring: Secondary | ICD-10-CM

## 2015-04-09 DIAGNOSIS — I4891 Unspecified atrial fibrillation: Secondary | ICD-10-CM

## 2015-04-09 LAB — POCT INR: INR: 2.2

## 2015-04-09 NOTE — Progress Notes (Signed)
Agree with recommendations.  

## 2015-04-09 NOTE — Progress Notes (Signed)
Pre visit review using our clinic review tool, if applicable. No additional management support is needed unless otherwise documented below in the visit note. 

## 2015-04-10 ENCOUNTER — Other Ambulatory Visit: Payer: Self-pay

## 2015-04-10 NOTE — Patient Outreach (Addendum)
Lindsay Sheppard) Care Management  [place]  04/10/2015   Lindsay Sheppard 1921/02/20 [id number]  Telephone call for  Initial health coach call.  Spoke with daughter Luanna Salk, she states that patient is doing about the same.  She states that she and her sister Ivin Booty are caregivers for their mother.  She reports that patient is weaker than she was last year.  She states that they are trying to keep mother at home as long as possible. She shares that she is also caring for her husband who has parkinson's and needs assistance as well.  Discussed being a caregiver and knowing when to rest in order to care for others.  Advised on [place] assistance if needed if placement is needed.  She verbalized understanding.  She reports that patient does not have issues presently with swelling or shortness of breath.  Patient unable to weigh due to not being able to stand on scale independently.    Discussed consent and return of consent with daughter Provided information on health coach contact number and 24 hour nurse line.  Current Medications:  Current Outpatient Prescriptions  Medication Sig Dispense Refill  . ACCU-CHEK COMPACT PLUS test strip   1  . acetaminophen (TYLENOL) 325 MG tablet Take 2 tablets (650 mg total) by mouth 3 (three) times daily.    . B-D INS SYRINGE 0.5CC/31GX5/16 31G X 5/16" 0.5 ML MISC   1  . cloNIDine (CATAPRES - DOSED IN MG/24 HR) 0.3 mg/24hr patch Place 0.3 mg onto the skin once a week.    . cloNIDine (CATAPRES - DOSED IN MG/24 HR) 0.3 mg/24hr patch APPLY 1 PATCH ONTO THE SKIN EVERY 7 DAYS 12 patch 3  . CRANBERRY PO Take 500 mg by mouth daily.     . cyanocobalamin (,VITAMIN B-12,) 1000 MCG/ML injection INJECT 1 ML (1000 MCG) INTO THE MUSCLE EVERY 30 DAYS. 1 mL 11  . donepezil (ARICEPT) 5 MG tablet Take 1 tablet (5 mg total) by mouth at bedtime. 90 tablet 3  . feeding supplement, GLUCERNA SHAKE, (GLUCERNA SHAKE) LIQD Take 237 mLs by  mouth 2 (two) times daily between meals. (Patient taking differently: Take 237 mLs by mouth daily. )  0  . ferrous sulfate 325 (65 FE) MG tablet Take 1 tablet (325 mg total) by mouth 2 (two) times daily with a meal. 60 tablet 1  . fluticasone (FLONASE) 50 MCG/ACT nasal spray Place 2 sprays into the nose daily. 16 g 2  . furosemide (LASIX) 20 MG tablet 1 tab by mouth per day as needed for swelling 90 tablet 3  . hydrALAZINE (APRESOLINE) 25 MG tablet Take 1 tablet (25 mg total) by mouth 3 (three) times daily. 270 tablet 3  . Hypromellose (ARTIFICIAL TEARS OP) Place 1 drop into both eyes daily.    . insulin glargine (LANTUS) 100 UNIT/ML injection Inject 0.2 mLs (20 Units total) into the skin daily. 30 mL 3  . labetalol (NORMODYNE) 200 MG tablet Take 400 mg by mouth 2 (two) times daily.    . pantoprazole (PROTONIX) 40 MG tablet TAKE 1 BY MOUTH DAILY 90 tablet 3  . polyethylene glycol (MIRALAX / GLYCOLAX) packet Take 17 g by mouth 2 (two) times daily. 14 each 0  . pravastatin (PRAVACHOL) 40 MG tablet TAKE 1/2 BY MOUTH DAILY 45 tablet 3  . cephALEXin (KEFLEX) 500 MG capsule Take 1 capsule (500 mg total) by mouth 4 (four) times daily. (Patient not taking: Reported on 04/10/2015)  40 capsule 0  . warfarin (COUMADIN) 2 MG tablet Take as directed by anticoagulation clinic 120 tablet 3   No current facility-administered medications for this visit.    Functional Status:  In your present state of health, do you have any difficulty performing the following activities: 04/10/2015 03/22/2015  Hearing? N N  Vision? N N  Difficulty concentrating or making decisions? Tempie Donning  Walking or climbing stairs? Y Y  Dressing or bathing? Y Y  Doing errands, shopping? Tempie Donning  Preparing Food and eating ? Y Y  Using the Toilet? Y Y  In the past six months, have you accidently leaked urine? Y Y  Do you have problems with loss of bowel control? N N  Managing your Medications? Y Y  Managing your Finances? Tempie Donning  Housekeeping or  managing your Housekeeping? Tempie Donning    Fall/Depression Screening: PHQ 2/9 Scores 04/10/2015 03/22/2015 03/06/2015 09/27/2013 03/22/2013  PHQ - 2 Score 0 0 0 0 0  Exception Documentation - Other- indicate reason in comment box - - -  Not completed - patient unable to answer; assessment completed with daughter Marland Kitchen   [place] Problem One        Patient Outreach Telephone from 04/10/2015 in [place] Problem One  High Risk for falls   Care Plan for Problem One  Active   THN Long Term Goal (31-90 days)  Patient will have no falls, fractures, injuries associated to falls within 90 days.    THN Long Term Goal Start Date  04/10/15   THN Long Term Goal Met Date  -- [Goal continued]   Interventions for Problem One Long Term Goal  RN Health coach will send EMMI information on fall prevention. RN Health coach discussed fall prevention with caregiver.    THN CM Short Term Goal #1 (0-30 days)  Patient will have no new falls within 30 days.    THN CM Short Term Goal #1 Start Date  03/22/15   THN CM Short Term Goal #1 Met Date  -- [Goal continued]   Interventions for Short Term Goal #1  discussed fall prevention with caregiver.     [facility] CM Care Plan Problem Two        Patient Outreach Telephone from 04/10/2015 in [place] Problem Two  Heart Failure Management   Care Plan for Problem Two  Active   Interventions for Problem Two Long Term Goal   Discussed heart failure zone chart and purpose.    THN Long Term Goal (31-90) days  Patient/caregivers will be able to identify heart failure zones within 90 days.    THN Long Term Goal Start Date  04/10/15   THN CM Short Term Goal #1 (0-30 days)  Patient will report stable Blood Sugar readings within 30 days.    THN CM Short Term Goal #1 Start Date  03/22/15   [facility] CM Short Term Goal #1 Met Date   04/10/15   Interventions for Short Term Goal #2   RN CM will provide education on importance of MD appt compliance to  monitor Diabetes Medication Management associated to blood sugar readings and weigh loss.     [place] [facility] CM Care Plan Problem Three        Patient Outreach Telephone from 04/10/2015 in [place] Problem Three  Advance Directives (review for updates needed)   Care Plan for Problem Three  Active   THN CM Short Term Goal #1 (0-30 days)  Patient and caregiver's will review Advanced Directives for any needed updates within 30 days.    THN CM Short Term Goal #1 Start Date  04/10/15   THN CM Short Term Goal #1 Met Date  -- [Goal continued]   Interventions for Short Term Goal #1  RN health coach offered support in updating advanced directives.   THN CM Short Term Goal #2 (0-30 days)  Patient and caregiver will provide copy of Advanced Directives to MD with 30 - 60 days.    THN CM Short Term Goal #2 Start Date  04/10/15   THN CM Short Term Goal #2 Met Date  -- [Goal continued]   Interventions for Short Term Goal #2  Discussed giving copy of updated advanced directive to doctors and other health care providers.        Assessment: Patient and family will benefit from health coach outreach for disease management.  Plan: RN Health Coach will provide ongoing education for patient on heart failure through phone calls and sending printed information to patient for further discussion. RN Health Coach will send welcome packet with consent to patient as well as printed information on health failure. RN Health Coach will send initial barriers letter, assessment, and care plan to primary care physician. RN Health Coach will contact patient within one month and patient agrees to next contact.   Jone Baseman, RN, MSN [place] [phone]

## 2015-04-19 ENCOUNTER — Encounter: Payer: Self-pay | Admitting: Internal Medicine

## 2015-04-19 ENCOUNTER — Ambulatory Visit (INDEPENDENT_AMBULATORY_CARE_PROVIDER_SITE_OTHER): Payer: Medicare Other | Admitting: General Practice

## 2015-04-19 ENCOUNTER — Ambulatory Visit (INDEPENDENT_AMBULATORY_CARE_PROVIDER_SITE_OTHER): Payer: Medicare Other | Admitting: Internal Medicine

## 2015-04-19 ENCOUNTER — Telehealth: Payer: Self-pay | Admitting: Internal Medicine

## 2015-04-19 VITALS — BP 144/74 | HR 71 | Temp 97.8°F

## 2015-04-19 DIAGNOSIS — R8290 Unspecified abnormal findings in urine: Secondary | ICD-10-CM | POA: Insufficient documentation

## 2015-04-19 DIAGNOSIS — R8299 Other abnormal findings in urine: Secondary | ICD-10-CM

## 2015-04-19 DIAGNOSIS — R0989 Other specified symptoms and signs involving the circulatory and respiratory systems: Secondary | ICD-10-CM | POA: Diagnosis not present

## 2015-04-19 DIAGNOSIS — R0689 Other abnormalities of breathing: Secondary | ICD-10-CM

## 2015-04-19 DIAGNOSIS — I4891 Unspecified atrial fibrillation: Secondary | ICD-10-CM

## 2015-04-19 DIAGNOSIS — Z5181 Encounter for therapeutic drug level monitoring: Secondary | ICD-10-CM

## 2015-04-19 DIAGNOSIS — I10 Essential (primary) hypertension: Secondary | ICD-10-CM | POA: Diagnosis not present

## 2015-04-19 MED ORDER — AMOXICILLIN-POT CLAVULANATE 875-125 MG PO TABS: 1.0000 | ORAL_TABLET | Freq: Two times a day (BID) | ORAL | Status: DC

## 2015-04-19 NOTE — Telephone Encounter (Signed)
Pt's daughter advised.  

## 2015-04-19 NOTE — Patient Instructions (Signed)
Please take all new medication as prescribed  - the antibiotic  Please continue all other medications as before, and refills have been done if requested.  Please have the pharmacy call with any other refills you may need.  Please keep your appointments with your specialists as you may have planned  Please go to the XRAY Department in the Basement (go straight as you get off the elevator) for the x-ray testing as you can  You will be contacted by phone if any changes need to be made immediately.  Otherwise, you will receive a letter about your results with an explanation, but please check with MyChart first.  Please remember to sign up for MyChart if you have not done so, as this will be important to you in the future with finding out test results, communicating by private email, and scheduling acute appointments online when needed.

## 2015-04-19 NOTE — Progress Notes (Signed)
Subjective:    Patient ID: Lindsay Sheppard, female    DOB: 1920/11/26, 81 y.o.   MRN: [id number]  HPI  Here with family, overall doing poorly in the last 2 days with weakness, lower BP at home,a nd [facility] reporting possible rales.  Pt is alert, severe HOH, appears frail and not helpful with hx, except for denies pain or worsening dyspnea, cough, fever, ST, CP.  Has chronic indwelling foley, and has been cloudy today. Denies urinary symptoms such as dysuria, frequency, urgency, flank pain, hematuria or n/v. Past Medical History  Diagnosis Date  . POSTHERPETIC NEURALGIA 11/02/2009  . B12 DEFICIENCY 05/23/2007  . HYPERLIPIDEMIA 05/23/2007  . HYPERKALEMIA 05/30/2010  . ANEMIA-IRON DEFICIENCY 01/26/2007  . BLEPHARITIS, BILATERAL 02/23/2008  . Other specified forms of hearing loss 05/30/2010  . HYPERTENSION 01/26/2007  . Atrial fibrillation 05/23/2007  . DIASTOLIC HEART FAILURE, CHRONIC 12/14/2009  . POSTURAL HYPOTENSION 02/07/2008  . ALLERGIC RHINITIS 11/02/2009  . GERD 01/26/2007  . CONSTIPATION 01/25/2008  . SKIN LESION 05/30/2010  . BACK PAIN 08/02/2007  . OSTEOPOROSIS 05/23/2007  . SYNCOPE 02/07/2008  . FATIGUE 09/06/2007  . Dysuria 09/05/2008  . Abdominal pain, epigastric 09/06/2007  . EPIGASTRIC TENDERNESS 10/12/2007  . PULMONARY EMBOLISM, HX OF 05/23/2007  . SHINGLES, HX OF 05/23/2007  . 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 Left foot pain   . Diabetic neuropathy   . RENAL INSUFFICIENCY 07/02/2009  . RENAL CYST, LEFT 09/06/2007  . Kidney stones     "never had OR"  . Family history of anesthesia complication     "daughter has PONV; another daughter a little anesthesia lasts way too long"  . CHF (congestive heart failure) 2011  . Myocardial infarction 2011    "mild"  . DVT (deep venous thrombosis) ~ 1964    "think it was in the left"  . Aspiration pneumonia ~ 2006    "due to aspiration"  . DIABETES MELLITUS, TYPE II 09/06/2007  . DEPRESSIVE DISORDER 01/25/2008    "@ times; never treated for it"  .  Melanoma of nose   . Compression fracture of lumbar vertebra     hx  . Compression fracture of thoracic vertebra 02/07/2014    "fell this am" (02/07/2014  . Arthritis     "hands" (03/15/2014)   Past Surgical History  Procedure Laterality Date  . Appendectomy  1943  . Abdominal hysterectomy  1964  . Cardiac electrophysiology mapping and ablation  1999  . Cataract extraction w/ intraocular lens  implant, bilateral  2003-2005  . Carpal tunnel release Right 2004  . Mohs surgery  2011    "removed off her nose"  . Cardiac catheterization  10/2009  . Esophageal dilation  X 2    reports that she has never smoked. She has never used smokeless tobacco. She reports that she does not drink alcohol or use illicit drugs. family history includes Breast cancer in her mother; Colon cancer in her father; Diabetes in her sister; Hypertension in her other; Stroke in her father. Allergies  Allergen Reactions  . Amiodarone Other (See Comments)    REACTION: f? fluid retention  . Deltasone [Prednisone] Other (See Comments)    unknown  . Diltiazem Hcl Other (See Comments)    unknown  . Levofloxacin Nausea And Vomiting    REACTION: nausea  . Pregabalin Other (See Comments)    REACTION: hallucinations  . Spironolactone Other (See Comments)    REACTION: elevated K at lowest dose  . Tequin [Gatifloxacin] Other (See  Comments)    Hypoglycemia  . Diazepam Anxiety and Other (See Comments)    REACTION: agitation   Current Outpatient Prescriptions on File Prior to Visit  Medication Sig Dispense Refill  . ACCU-CHEK COMPACT PLUS test strip   1  . acetaminophen (TYLENOL) 325 MG tablet Take 2 tablets (650 mg total) by mouth 3 (three) times daily.    . B-D INS SYRINGE 0.5CC/31GX5/16 31G X 5/16" 0.5 ML MISC   1  . cloNIDine (CATAPRES - DOSED IN MG/24 HR) 0.3 mg/24hr patch Place 0.3 mg onto the skin once a week.    Marland Kitchen CRANBERRY PO Take 500 mg by mouth daily.     . cyanocobalamin (,VITAMIN B-12,) 1000 MCG/ML injection  INJECT 1 ML (1000 MCG) INTO THE MUSCLE EVERY 30 DAYS. 1 mL 11  . donepezil (ARICEPT) 5 MG tablet Take 1 tablet (5 mg total) by mouth at bedtime. (Patient taking differently: Take 10 mg by mouth at bedtime. ) 90 tablet 3  . feeding supplement, GLUCERNA SHAKE, (GLUCERNA SHAKE) LIQD Take 237 mLs by mouth 2 (two) times daily between meals. (Patient taking differently: Take 237 mLs by mouth daily. )  0  . ferrous sulfate 325 (65 FE) MG tablet Take 1 tablet (325 mg total) by mouth 2 (two) times daily with a meal. 60 tablet 1  . fluticasone (FLONASE) 50 MCG/ACT nasal spray Place 2 sprays into the nose daily. 16 g 2  . furosemide (LASIX) 20 MG tablet 1 tab by mouth per day as needed for swelling 90 tablet 3  . hydrALAZINE (APRESOLINE) 25 MG tablet Take 1 tablet (25 mg total) by mouth 3 (three) times daily. 270 tablet 3  . Hypromellose (ARTIFICIAL TEARS OP) Place 1 drop into both eyes daily.    . insulin glargine (LANTUS) 100 UNIT/ML injection Inject 0.2 mLs (20 Units total) into the skin daily. 30 mL 3  . labetalol (NORMODYNE) 200 MG tablet Take 400 mg by mouth 2 (two) times daily.    . pantoprazole (PROTONIX) 40 MG tablet TAKE 1 BY MOUTH DAILY 90 tablet 3  . polyethylene glycol (MIRALAX / GLYCOLAX) packet Take 17 g by mouth 2 (two) times daily. 14 each 0  . pravastatin (PRAVACHOL) 40 MG tablet TAKE 1/2 BY MOUTH DAILY 45 tablet 3  . warfarin (COUMADIN) 2 MG tablet Take as directed by anticoagulation clinic 120 tablet 3   No current facility-administered medications on file prior to visit.   Review of Systems - per family, pt unable  Constitutional: Negative for unusual diaphoresis or night sweats HENT: Negative for ringing in ear or discharge Eyes: Negative for double vision or worsening visual disturbance.  Respiratory: Negative for choking and stridor.   Gastrointestinal: Negative for vomiting or other signifcant bowel change Genitourinary: Negative for hematuria or change in urine volume.    Musculoskeletal: Negative for other MSK pain or swelling Skin: Negative for color change and worsening wound.  Neurological: Negative for tremors and numbness other than noted  Psychiatric/Behavioral: Negative for decreased concentration or agitation other than above       Objective:   Physical Exam BP 144/74 mmHg  Pulse 71  Temp(Src) 97.8 F (36.6 C) (Oral)  SpO2 97% VS noted,  Constitutional: Pt appears in no significant distress HENT: Head: NCAT.  Right Ear: External ear normal.  Left Ear: External ear normal.  Eyes: . Pupils are equal, round, and reactive to light. Conjunctivae and EOM are normal Neck: Normal range of motion. Neck supple.  Cardiovascular: Normal  rate and regular rhythm.   Pulmonary/Chest: Effort normal and breath sounds without wheezing, but has few bibasilar rales.  Abd:  Soft, NT, ND, + BS, no flank tender Neurological: Pt is alert. Not confused , motor grossly intact Skin: Skin is warm. No rash, no LE edema Psychiatric: Pt behavior is normal. No agitation.     Assessment & Plan:

## 2015-04-19 NOTE — Telephone Encounter (Signed)
Spoke with Odis Hollingshead and advised patient to come in for an appointment.  I have scheduled patient for this afternoon.  Nurse would like patient to be called to see if she needs to hold off on her BP meds until her appointment.

## 2015-04-19 NOTE — Telephone Encounter (Signed)
Seeing patient now and caregiver is reporting patient is weak.  States they got her bp at 93/43 this morning.  While in home today got 104-52.  Patient having leg cramps at night and does have a pale color.  Has already taken meds this morning.  Patient does have crackle on her lower lung.  Would like to know if blood work needs to be run while in the home.

## 2015-04-19 NOTE — Telephone Encounter (Signed)
Please advise 

## 2015-04-19 NOTE — Telephone Encounter (Signed)
Ok to hold BP med

## 2015-04-19 NOTE — Progress Notes (Signed)
Pre visit review using our clinic review tool, if applicable. No additional management support is needed unless otherwise documented below in the visit note. 

## 2015-04-22 NOTE — Assessment & Plan Note (Signed)
Possible UTI, lab closed at this time, most prudent is to tx empiric for UTI - Mild to mod, for antibx course,  to f/u any worsening symptoms or concerns

## 2015-04-22 NOTE — Assessment & Plan Note (Signed)
Etiology unclear, ? pna vs volume overload (less likely), for cxr, augmentin as prescribed

## 2015-04-22 NOTE — Assessment & Plan Note (Signed)
BP Readings from Last 3 Encounters:  04/19/15 144/74  03/06/15 98/60  12/05/14 108/64   Mild increased today, o/w stable overall by history and exam, recent data reviewed with pt and family, and pt to continue medical treatment as before,  to f/u any worsening symptoms or concerns

## 2015-04-23 ENCOUNTER — Telehealth: Payer: Self-pay | Admitting: General Practice

## 2015-04-23 NOTE — Telephone Encounter (Signed)
Spoke with patient's daughter, [name] about interactions with antibiotics and warfarin.

## 2015-04-26 ENCOUNTER — Other Ambulatory Visit: Payer: Self-pay | Admitting: Internal Medicine

## 2015-05-02 DIAGNOSIS — Z466 Encounter for fitting and adjustment of urinary device: Secondary | ICD-10-CM | POA: Diagnosis not present

## 2015-05-03 ENCOUNTER — Ambulatory Visit (INDEPENDENT_AMBULATORY_CARE_PROVIDER_SITE_OTHER): Payer: Medicare Other | Admitting: General Practice

## 2015-05-03 DIAGNOSIS — Z5181 Encounter for therapeutic drug level monitoring: Secondary | ICD-10-CM

## 2015-05-03 DIAGNOSIS — I4891 Unspecified atrial fibrillation: Secondary | ICD-10-CM

## 2015-05-03 LAB — POCT INR: INR: 2.2

## 2015-05-03 NOTE — Progress Notes (Signed)
Pre visit review using our clinic review tool, if applicable. No additional management support is needed unless otherwise documented below in the visit note. 

## 2015-05-09 ENCOUNTER — Other Ambulatory Visit: Payer: Self-pay

## 2015-05-09 NOTE — Patient Outreach (Signed)
[place] [facility]) Care Management  [place]  05/09/2015   Lindsay Sheppard 1921-04-15 [id number]  Telephone call for monthly outreach. Spoke with daughter Luanna Salk.  She reports that patient is weaker and that they are having to offer fluids more often as patient is not taking the initiative to drink.  She reports that patient is complaining of some cramps to her legs.  Discussed reasons for cramps and encouraged to continue offering fluids.  She reports that patient has a great appetite.  Discussed ways to get fluids through diet.  Discussed with daughter feeding tube and she states they really do not want that for their mom.  Patient continues to have advanced home care for B12 injection and foley change and visit due to today.  Heart Failure: Patient having no swelling and no shortness of breath.  Discussed heart failure zone chart with daughter. She verbalized understanding.     Current Medications:  Current Outpatient Prescriptions  Medication Sig Dispense Refill  . ACCU-CHEK COMPACT PLUS test strip   1  . acetaminophen (TYLENOL) 325 MG tablet Take 2 tablets (650 mg total) by mouth 3 (three) times daily.    . B-D INS SYRINGE 0.5CC/31GX5/16 31G X 5/16" 0.5 ML MISC   1  . cloNIDine (CATAPRES - DOSED IN MG/24 HR) 0.3 mg/24hr patch Place 0.3 mg onto the skin once a week.    Marland Kitchen CRANBERRY PO Take 500 mg by mouth daily.     . cyanocobalamin (,VITAMIN B-12,) 1000 MCG/ML injection INJECT 1 ML INTO THE MUSCLE EVERY 30 DAYS 1 mL 11  . donepezil (ARICEPT) 5 MG tablet Take 1 tablet (5 mg total) by mouth at bedtime. (Patient taking differently: Take 10 mg by mouth at bedtime. ) 90 tablet 3  . feeding supplement, GLUCERNA SHAKE, (GLUCERNA SHAKE) LIQD Take 237 mLs by mouth 2 (two) times daily between meals. (Patient taking differently: Take 237 mLs by mouth daily. )  0  . ferrous sulfate 325 (65 FE) MG tablet Take 1 tablet (325 mg total) by mouth 2 (two) times daily with a meal. 60  tablet 1  . fluticasone (FLONASE) 50 MCG/ACT nasal spray Place 2 sprays into the nose daily. 16 g 2  . furosemide (LASIX) 20 MG tablet 1 tab by mouth per day as needed for swelling 90 tablet 3  . hydrALAZINE (APRESOLINE) 25 MG tablet Take 1 tablet (25 mg total) by mouth 3 (three) times daily. 270 tablet 3  . Hypromellose (ARTIFICIAL TEARS OP) Place 1 drop into both eyes daily.    . insulin glargine (LANTUS) 100 UNIT/ML injection Inject 0.2 mLs (20 Units total) into the skin daily. 30 mL 3  . labetalol (NORMODYNE) 200 MG tablet Take 400 mg by mouth 2 (two) times daily.    . pantoprazole (PROTONIX) 40 MG tablet TAKE 1 BY MOUTH DAILY 90 tablet 3  . polyethylene glycol (MIRALAX / GLYCOLAX) packet Take 17 g by mouth 2 (two) times daily. 14 each 0  . pravastatin (PRAVACHOL) 40 MG tablet TAKE 1/2 BY MOUTH DAILY 45 tablet 3  . warfarin (COUMADIN) 2 MG tablet Take as directed by anticoagulation clinic 120 tablet 3  . amoxicillin-clavulanate (AUGMENTIN) 875-125 MG per tablet Take 1 tablet by mouth 2 (two) times daily. (Patient not taking: Reported on 05/09/2015) 20 tablet 0   No current facility-administered medications for this visit.    Functional Status:  In your present state of health, do you have any difficulty performing the following  activities: 04/10/2015 03/22/2015  Hearing? N N  Vision? N N  Difficulty concentrating or making decisions? Tempie Donning  Walking or climbing stairs? Y Y  Dressing or bathing? Y Y  Doing errands, shopping? Tempie Donning  Preparing Food and eating ? Y Y  Using the Toilet? Y Y  In the past six months, have you accidently leaked urine? Y Y  Do you have problems with loss of bowel control? N N  Managing your Medications? Y Y  Managing your Finances? Tempie Donning  Housekeeping or managing your Housekeeping? Tempie Donning    Fall/Depression Screening: PHQ 2/9 Scores 04/10/2015 03/22/2015 03/06/2015 09/27/2013 03/22/2013  PHQ - 2 Score 0 0 0 0 0  Exception Documentation - Other- indicate reason in comment box  - - -  Not completed - patient unable to answer; assessment completed with daughter - - -   [facility] CM Care Plan Problem One        Most Recent Value   Care Plan Problem One  High Risk for falls   Role Documenting the Problem One  [place] for Problem One  Active   THN Long Term Goal (31-90 days)  Patient will have no falls, fractures, injuries associated to falls within 90 days.    THN Long Term Goal Start Date  04/10/15   THN Long Term Goal Met Date  -- [Goal continued]   Interventions for Problem One Long Term Goal  Reviewed EMMI fall prevention information   THN CM Short Term Goal #1 (0-30 days)  Patient will have no new falls within 30 days.    THN CM Short Term Goal #1 Start Date  05/09/15 [goal continued]   THN CM Short Term Goal #1 Met Date  -- [Goal continued]   Interventions for Short Term Goal #1  Reviewed fall prevention EMMI information    THN CM Care Plan Problem Two        Most Recent Value   Care Plan Problem Two  Heart Failure Management   Role Documenting the Problem Two  [place] for Problem Two  Active   Interventions for Problem Two Long Term Goal   Reviewed with daughter heart failure zone chart.    THN Long Term Goal (31-90) days  Patient/caregivers will be able to identify heart failure zones within 90 days.    THN Long Term Goal Start Date  04/10/15   THN CM Short Term Goal #1 (0-30 days)  Patient/Caregivers will be able to name heart failure green zone within 30 days   THN CM Short Term Goal #1 Start Date  05/09/15   Interventions for Short Term Goal #2   Discussed with daughter green zone of heart failure chart.     [facility] CM Care Plan Problem Three        Most Recent Value   Care Plan Problem Three  Advance Directives (review for updates needed)   Role Documenting the Problem [place] for Problem Three  Active   THN CM Short Term Goal #1 (0-30 days)  Patient and caregiver's will review Advanced Directives  for any needed updates within 30 days.    THN CM Short Term Goal #1 Start Date  05/09/15 [goal  continued]   THN CM Short Term Goal #1 Met Date  -- [Goal continued]   Interventions for Short Term Goal #1  Support offered for updating advanced directives.   THN  CM Short Term Goal #2 (0-30 days)  Patient and caregiver will provide copy of Advanced Directives to MD with 30 - 60 days.    THN CM Short Term Goal #2 Start Date  05/09/15 [goal continued]   THN CM Short Term Goal #2 Met Date  -- [Goal continued]   Interventions for Short Term Goal #2  Discussed giving updated copies of advanced directives to providers       Assessment: Patient/caregivers continue to benefit from health coach outreach for disease management  Plan: [facility] will contact patient/caregivers within one month and patient/caregivers agree to next outreach.    Jone Baseman, RN, MSN [place] [phone]

## 2015-05-17 ENCOUNTER — Ambulatory Visit (INDEPENDENT_AMBULATORY_CARE_PROVIDER_SITE_OTHER): Payer: Medicare Other | Admitting: General Practice

## 2015-05-17 DIAGNOSIS — Z5181 Encounter for therapeutic drug level monitoring: Secondary | ICD-10-CM

## 2015-05-17 LAB — POCT INR: INR: 1.6

## 2015-05-17 NOTE — Progress Notes (Signed)
Pre visit review using our clinic review tool, if applicable. No additional management support is needed unless otherwise documented below in the visit note. 

## 2015-05-31 LAB — POCT INR: INR: 2

## 2015-06-04 ENCOUNTER — Ambulatory Visit (INDEPENDENT_AMBULATORY_CARE_PROVIDER_SITE_OTHER): Payer: Medicare Other | Admitting: General Practice

## 2015-06-04 DIAGNOSIS — Z5181 Encounter for therapeutic drug level monitoring: Secondary | ICD-10-CM

## 2015-06-04 NOTE — Progress Notes (Signed)
Pre visit review using our clinic review tool, if applicable. No additional management support is needed unless otherwise documented below in the visit note. 

## 2015-06-06 ENCOUNTER — Ambulatory Visit: Payer: Self-pay

## 2015-06-06 NOTE — Progress Notes (Signed)
I have reviewed and agree with the plan. 

## 2015-06-06 NOTE — Patient Outreach (Signed)
[place] [facility]) Care Management  06/06/2015  Lindsay Sheppard [birth year] [id number]   Call to daughter Luanna Salk.  She requests call another day.  Advised that I would reschedule appointment.    Plan: RN Health Coach will reschedule within 1-2 weeks.   Jone Baseman, RN, MSN [place] [phone]

## 2015-06-12 ENCOUNTER — Other Ambulatory Visit: Payer: Self-pay

## 2015-06-12 NOTE — Patient Outreach (Signed)
[place] [facility]) Care Management  Lindsay Sheppard  06/12/2015   Lindsay Sheppard Apr 28, 1921 [id number]  Subjective: Telephone call to caregiver Lindsay Sheppard for monthly call.  Daughter states patient doing about the same. She reports that patient fluid intake is still not the best but they continue to offer fluids.  However, daughter states patient appetite remains good.  Patient foley recently came out without bulb being inflated but put back in by home health nurse within hours. Daughter denies any falls or problems with pain.   Heart Failure: Daughter denies patient having any shortness of breath or swelling. No weights done as patient unable to stand on scale.  Patient continues to adhere to low salt diet.   Coached on heart failure zone chart.    Diabetes: Daughter reports blood sugar this am was 77 and no problems with increased blood sugar.   Objective:   Current Medications:  Current Outpatient Prescriptions  Medication Sig Dispense Refill  . ACCU-CHEK COMPACT PLUS test strip   1  . acetaminophen (TYLENOL) 325 MG tablet Take 2 tablets (650 mg total) by mouth 3 (three) times daily.    . B-D INS SYRINGE 0.5CC/31GX5/16 31G X 5/16" 0.5 ML MISC   1  . cloNIDine (CATAPRES - DOSED IN MG/24 HR) 0.3 mg/24hr patch Place 0.3 mg onto the skin once a week.    Marland Kitchen CRANBERRY PO Take 500 mg by mouth daily.     . cyanocobalamin (,VITAMIN B-12,) 1000 MCG/ML injection INJECT 1 ML INTO THE MUSCLE EVERY 30 DAYS 1 mL 11  . donepezil (ARICEPT) 5 MG tablet Take 1 tablet (5 mg total) by mouth at bedtime. (Patient taking differently: Take 10 mg by mouth at bedtime. ) 90 tablet 3  . feeding supplement, GLUCERNA SHAKE, (GLUCERNA SHAKE) LIQD Take 237 mLs by mouth 2 (two) times daily between meals. (Patient taking differently: Take 237 mLs by mouth daily. )  0  . ferrous sulfate 325 (65 FE) MG tablet Take 1 tablet (325 mg total) by mouth 2 (two) times daily with a meal. 60 tablet 1  . fluticasone  (FLONASE) 50 MCG/ACT nasal spray Place 2 sprays into the nose daily. 16 g 2  . furosemide (LASIX) 20 MG tablet 1 tab by mouth per day as needed for swelling 90 tablet 3  . hydrALAZINE (APRESOLINE) 25 MG tablet Take 1 tablet (25 mg total) by mouth 3 (three) times daily. 270 tablet 3  . Hypromellose (ARTIFICIAL TEARS OP) Place 1 drop into both eyes daily.    . insulin glargine (LANTUS) 100 UNIT/ML injection Inject 0.2 mLs (20 Units total) into the skin daily. 30 mL 3  . labetalol (NORMODYNE) 200 MG tablet Take 400 mg by mouth 2 (two) times daily.    . pantoprazole (PROTONIX) 40 MG tablet TAKE 1 BY MOUTH DAILY 90 tablet 3  . polyethylene glycol (MIRALAX / GLYCOLAX) packet Take 17 g by mouth 2 (two) times daily. 14 each 0  . pravastatin (PRAVACHOL) 40 MG tablet TAKE 1/2 BY MOUTH DAILY 45 tablet 3  . warfarin (COUMADIN) 2 MG tablet Take as directed by anticoagulation clinic 120 tablet 3  . amoxicillin-clavulanate (AUGMENTIN) 875-125 MG per tablet Take 1 tablet by mouth 2 (two) times daily. (Patient not taking: Reported on 05/09/2015) 20 tablet 0   No current facility-administered medications for this visit.    Functional Status:  In your present state of health, do you have any difficulty performing the following activities: 04/10/2015 03/22/2015  Hearing?  N N  Vision? N N  Difficulty concentrating or making decisions? Lindsay Sheppard  Walking or climbing stairs? Y Y  Dressing or bathing? Y Y  Doing errands, shopping? Lindsay Sheppard  Preparing Food and eating ? Y Y  Using the Toilet? Y Y  In the past six months, have you accidently leaked urine? Y Y  Do you have problems with loss of bowel control? N N  Managing your Medications? Y Y  Managing your Finances? Lindsay Sheppard  Housekeeping or managing your Housekeeping? Lindsay Sheppard    Fall/Depression Screening: PHQ 2/9 Scores 06/12/2015 04/10/2015 03/22/2015 03/06/2015 09/27/2013 03/22/2013  PHQ - 2 Score 0 0 0 0 0 0  Exception Documentation - - Other- indicate reason in comment box - - -   Not completed - - patient unable to answer; assessment completed with daughter - - -    Assessment: Patient Lindsay Sheppard continues to benefit from Customer service manager for disease management.  Plan:  [facility] CM Care Plan Problem One        Most Recent Value   Care Plan Problem One  High Risk for falls   Role Documenting the Problem One  [place] for Problem One  Active   THN Long Term Goal (31-90 days)  Patient will have no falls, fractures, injuries associated to falls within 90 days.    THN Long Term Goal Start Date  04/10/15   THN Long Term Goal Met Date  -- [Goal continued]   Interventions for Problem One Long Term Goal  Re-emphasized falls prevention.     THN CM Short Term Goal #1 (0-30 days)  Patient will have no new falls within 30 days.    THN CM Short Term Goal #1 Start Date  05/09/15 [goal continued]   THN CM Short Term Goal #1 Met Date  06/12/15 [Goal continued]   Interventions for Short Term Goal #1  Reviewed fall prevention EMMI information    THN CM Care Plan Problem Two        Most Recent Value   Care Plan Problem Two  Heart Failure Management   Role Documenting the Problem Two  [place] for Problem Two  Active   Interventions for Problem Two Long Term Goal   Reviewed with daughter heart failure zone chart.    THN Long Term Goal (31-90) days  Patient/caregivers will be able to identify heart failure zones within 90 days.    THN Long Term Goal Start Date  04/10/15   THN CM Short Term Goal #1 (0-30 days)  Patient/Caregivers will be able to name heart failure green zone within 30 days   THN CM Short Term Goal #1 Start Date  05/09/15   [facility] CM Short Term Goal #1 Met Date   06/12/15   Interventions for Short Term Goal #2   Reviewed with caregiver heart failure green zone.     THN CM Short Term Goal #2 (0-30 days)  Patient/caregiver will be able to verbalize yellow zone on heart failure action plan.     THN CM Short Term Goal #2 Start Date   06/12/15   Interventions for Short Term Goal #2  Discussed yellow zone of heart failure chart with daughter.      THN CM Care Plan Problem Three        Most Recent Value   Care Plan Problem Three  Advance Directives (review for updates needed)   Role Documenting the Problem  [place] for Problem Three  Not Active   THN CM Short Term Goal #1 (0-30 days)  Patient and caregiver's will review Advanced Directives for any needed updates within 30 days.    THN CM Short Term Goal #1 Start Date  05/09/15 [Caregiver/patient currently not working on goal. ]   THN CM Short Term Goal #1 Met Date  -- [Goal continued]   Interventions for Short Term Goal #1  Support offered for updating advanced directives when ready.   THN CM Short Term Goal #2 (0-30 days)  Patient and caregiver will provide copy of Advanced Directives to MD with 30 - 60 days.    THN CM Short Term Goal #2 Start Date  05/09/15 [Caregiver/patient no longer working on goal. ]   THN CM Short Term Goal #2 Met Date  -- [Goal continued]   Interventions for Short Term Goal #2  Offered support when ready to continue goal.       [facility]  will contact patient/caregiver within one month and patient/caregiver agrees to next outreach.    Jone Baseman, RN, MSN [place] [phone]

## 2015-06-14 ENCOUNTER — Ambulatory Visit (INDEPENDENT_AMBULATORY_CARE_PROVIDER_SITE_OTHER): Payer: Medicare Other | Admitting: General Practice

## 2015-06-14 DIAGNOSIS — Z5181 Encounter for therapeutic drug level monitoring: Secondary | ICD-10-CM

## 2015-06-14 LAB — POCT INR: INR: 2.2

## 2015-06-14 NOTE — Progress Notes (Signed)
Pre visit review using our clinic review tool, if applicable. No additional management support is needed unless otherwise documented below in the visit note. 

## 2015-06-28 ENCOUNTER — Ambulatory Visit (INDEPENDENT_AMBULATORY_CARE_PROVIDER_SITE_OTHER): Payer: Medicare Other | Admitting: General Practice

## 2015-06-28 DIAGNOSIS — Z5181 Encounter for therapeutic drug level monitoring: Secondary | ICD-10-CM

## 2015-06-28 LAB — POCT INR: INR: 2.1

## 2015-06-28 NOTE — Progress Notes (Signed)
Ok with this plan 

## 2015-06-28 NOTE — Progress Notes (Signed)
Pre visit review using our clinic review tool, if applicable. No additional management support is needed unless otherwise documented below in the visit note. 

## 2015-07-01 ENCOUNTER — Emergency Department (HOSPITAL_COMMUNITY)
Admission: EM | Admit: 2015-07-01 | Discharge: 2015-07-01 | Disposition: A | Discharge status: Home / Self Care | Payer: Medicare Other | Attending: Emergency Medicine | Admitting: Emergency Medicine

## 2015-07-01 ENCOUNTER — Encounter (HOSPITAL_COMMUNITY): Payer: Self-pay

## 2015-07-01 DIAGNOSIS — Z7951 Long term (current) use of inhaled steroids: Secondary | ICD-10-CM | POA: Insufficient documentation

## 2015-07-01 DIAGNOSIS — R55 Syncope and collapse: Secondary | ICD-10-CM | POA: Diagnosis not present

## 2015-07-01 DIAGNOSIS — Z862 Personal history of diseases of the blood and blood-forming organs and certain disorders involving the immune mechanism: Secondary | ICD-10-CM | POA: Diagnosis not present

## 2015-07-01 DIAGNOSIS — I509 Heart failure, unspecified: Secondary | ICD-10-CM | POA: Diagnosis not present

## 2015-07-01 DIAGNOSIS — Z86718 Personal history of other venous thrombosis and embolism: Secondary | ICD-10-CM | POA: Insufficient documentation

## 2015-07-01 DIAGNOSIS — Z87442 Personal history of urinary calculi: Secondary | ICD-10-CM | POA: Diagnosis not present

## 2015-07-01 DIAGNOSIS — N390 Urinary tract infection, site not specified: Secondary | ICD-10-CM | POA: Diagnosis not present

## 2015-07-01 DIAGNOSIS — E1140 Type 2 diabetes mellitus with diabetic neuropathy, unspecified: Secondary | ICD-10-CM | POA: Insufficient documentation

## 2015-07-01 DIAGNOSIS — I252 Old myocardial infarction: Secondary | ICD-10-CM | POA: Insufficient documentation

## 2015-07-01 DIAGNOSIS — K219 Gastro-esophageal reflux disease without esophagitis: Secondary | ICD-10-CM | POA: Diagnosis not present

## 2015-07-01 DIAGNOSIS — E785 Hyperlipidemia, unspecified: Secondary | ICD-10-CM | POA: Diagnosis not present

## 2015-07-01 DIAGNOSIS — E860 Dehydration: Secondary | ICD-10-CM | POA: Diagnosis not present

## 2015-07-01 DIAGNOSIS — F0390 Unspecified dementia without behavioral disturbance: Secondary | ICD-10-CM | POA: Insufficient documentation

## 2015-07-01 DIAGNOSIS — I10 Essential (primary) hypertension: Secondary | ICD-10-CM | POA: Diagnosis not present

## 2015-07-01 DIAGNOSIS — Z86711 Personal history of pulmonary embolism: Secondary | ICD-10-CM | POA: Insufficient documentation

## 2015-07-01 LAB — URINALYSIS, ROUTINE W REFLEX MICROSCOPIC
Glucose, UA: NEGATIVE mg/dL
Hgb urine dipstick: NEGATIVE
Ketones, ur: NEGATIVE mg/dL
NITRITE: POSITIVE — AB
PROTEIN: 30 mg/dL — AB
SPECIFIC GRAVITY, URINE: 1.023 (ref 1.005–1.030)
UROBILINOGEN UA: 1 mg/dL (ref 0.0–1.0)
pH: 6 (ref 5.0–8.0)

## 2015-07-01 LAB — BASIC METABOLIC PANEL
ANION GAP: 9 (ref 5–15)
BUN: 30 mg/dL — ABNORMAL HIGH (ref 6–20)
CALCIUM: 9.5 mg/dL (ref 8.9–10.3)
CO2: 23 mmol/L (ref 22–32)
Chloride: 108 mmol/L (ref 101–111)
Creatinine, Ser: 1.14 mg/dL — ABNORMAL HIGH (ref 0.44–1.00)
GFR, EST AFRICAN AMERICAN: 47 mL/min — AB (ref >60)
GFR, EST NON AFRICAN AMERICAN: 40 mL/min — AB (ref >60)
Glucose, Bld: 162 mg/dL — ABNORMAL HIGH (ref 65–99)
POTASSIUM: 4.6 mmol/L (ref 3.5–5.1)
SODIUM: 140 mmol/L (ref 135–145)

## 2015-07-01 LAB — CBC
HEMATOCRIT: 37.9 % (ref 36.0–46.0)
HEMOGLOBIN: 11.8 g/dL — AB (ref 12.0–15.0)
MCH: 27.7 pg (ref 26.0–34.0)
MCHC: 31.1 g/dL (ref 30.0–36.0)
MCV: 89 fL (ref 78.0–100.0)
Platelets: 161 10*3/uL (ref 150–400)
RBC: 4.26 MIL/uL (ref 3.87–5.11)
RDW: 15.1 % (ref 11.5–15.5)
WBC: 6 10*3/uL (ref 4.0–10.5)

## 2015-07-01 LAB — CBG MONITORING, ED: GLUCOSE-CAPILLARY: 145 mg/dL — AB (ref 65–99)

## 2015-07-01 LAB — URINE MICROSCOPIC-ADD ON

## 2015-07-01 MED ORDER — SODIUM CHLORIDE 0.9 % IV SOLN
INTRAVENOUS | Status: DC
  Administered 2015-07-01: 16:00:00 via INTRAVENOUS

## 2015-07-01 MED ORDER — SODIUM CHLORIDE 0.9 % IV BOLUS (SEPSIS)
500.0000 mL | Freq: Once | INTRAVENOUS | Status: AC
  Administered 2015-07-01: 500 mL via INTRAVENOUS

## 2015-07-01 MED ORDER — CEPHALEXIN 500 MG PO CAPS: 500.0000 mg | ORAL_CAPSULE | Freq: Four times a day (QID) | ORAL | Status: DC

## 2015-07-01 MED ORDER — DEXTROSE 5 % IV SOLN
1.0000 g | Freq: Once | INTRAVENOUS | Status: AC
  Administered 2015-07-01: 1 g via INTRAVENOUS
  Filled 2015-07-01: qty 10

## 2015-07-01 NOTE — ED Notes (Signed)
Per EMS - pt from home. Pt has urinary catheter draining cloudy urine. Pt on toilet, upon getting up pt became weak and "unresponsive" per family for 2-3 minutes. Pt hypertensive.

## 2015-07-01 NOTE — ED Provider Notes (Signed)
CSN: [id number]     Arrival date & time 07/01/15  1433 History   First MD Initiated Contact with Patient 07/01/15 1501     Chief Complaint  Patient presents with  . Urinary Tract Infection  . Near Syncope     (Consider location/radiation/quality/duration/timing/severity/associated sxs/prior Treatment) HPI   Lindsay Sheppard is a 81 y.o. female who presents for evaluation of syncope. She was sitting on a commode when she passed out. He had just had a bowel movement. Family members held her from falling for several minutes, and tell an ambulance arrived. She was then laid down on the floor and became alert. No other prior symptoms. She has recovered to her normal baseline. She is unable to give history.  Level 5 Caveat-dementia    Past Medical History  Diagnosis Date  . POSTHERPETIC NEURALGIA 11/02/2009  . B12 DEFICIENCY 05/23/2007  . HYPERLIPIDEMIA 05/23/2007  . HYPERKALEMIA 05/30/2010  . ANEMIA-IRON DEFICIENCY 01/26/2007  . BLEPHARITIS, BILATERAL 02/23/2008  . Other specified forms of hearing loss 05/30/2010  . HYPERTENSION 01/26/2007  . Atrial fibrillation ([place]) 05/23/2007  . DIASTOLIC HEART FAILURE, CHRONIC 12/14/2009  . POSTURAL HYPOTENSION 02/07/2008  . ALLERGIC RHINITIS 11/02/2009  . GERD 01/26/2007  . CONSTIPATION 01/25/2008  . SKIN LESION 05/30/2010  . BACK PAIN 08/02/2007  . OSTEOPOROSIS 05/23/2007  . SYNCOPE 02/07/2008  . FATIGUE 09/06/2007  . Dysuria 09/05/2008  . Abdominal pain, epigastric 09/06/2007  . EPIGASTRIC TENDERNESS 10/12/2007  . PULMONARY EMBOLISM, HX OF 05/23/2007  . SHINGLES, HX OF 05/23/2007  . 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 Left foot pain   . Diabetic neuropathy ([place])   . RENAL INSUFFICIENCY 07/02/2009  . RENAL CYST, LEFT 09/06/2007  . Kidney stones     "never had OR"  . Family history of anesthesia complication     "daughter has PONV; another daughter a little anesthesia lasts way too long"  . CHF (congestive heart failure) ([place]) 2011  . Myocardial infarction [facility])  2011    "mild"  . DVT (deep venous thrombosis) ([place]) ~ 1964    "think it was in the left"  . Aspiration pneumonia ([place]) ~ 2006    "due to aspiration"  . DIABETES MELLITUS, TYPE II 09/06/2007  . DEPRESSIVE DISORDER 01/25/2008    "@ times; never treated for it"  . Melanoma of nose ([place])   . Compression fracture of lumbar vertebra ([facility])     hx  . Compression fracture of thoracic vertebra ([place]) 02/07/2014    "fell this am" (02/07/2014  . Arthritis     "hands" (03/15/2014)   Past Surgical History  Procedure Laterality Date  . Appendectomy  1943  . Abdominal hysterectomy  1964  . Cardiac electrophysiology mapping and ablation  1999  . Cataract extraction w/ intraocular lens  implant, bilateral  2003-2005  . Carpal tunnel release Right 2004  . Mohs surgery  2011    "removed off her nose"  . Cardiac catheterization  10/2009  . Esophageal dilation  X 2   Family History  Problem Relation Age of Onset  . Breast cancer Mother   . Colon cancer Father   . Stroke Father     died with stroke postop  . Diabetes Sister     2 sisters  . Hypertension Other    Social History  Substance Use Topics  . Smoking status: Never Smoker   . Smokeless tobacco: Never Used  . Alcohol Use: No   OB History    No data available  Review of Systems  Unable to perform ROS: Dementia      Allergies  Amiodarone; Deltasone; Diltiazem hcl; Levofloxacin; Pregabalin; Spironolactone; Tequin; and Diazepam  Home Medications   Prior to Admission medications   Medication Sig Start Date End Date Taking? Authorizing Provider  ACCU-CHEK COMPACT PLUS test strip  12/19/14   Historical Provider, MD  acetaminophen (TYLENOL) 325 MG tablet Take 2 tablets (650 mg total) by mouth 3 (three) times daily. 02/09/14   Delfina Redwood, MD  amoxicillin-clavulanate (AUGMENTIN) 875-125 MG per tablet Take 1 tablet by mouth 2 (two) times daily. Patient not taking: Reported on 05/09/2015 04/19/15   Biagio Borg, MD  B-D INS SYRINGE  0.5CC/31GX5/16 31G X 5/16" 0.5 ML MISC  02/06/15   Historical Provider, MD  cloNIDine (CATAPRES - DOSED IN MG/24 HR) 0.3 mg/24hr patch Place 0.3 mg onto the skin once a week.    Historical Provider, MD  CRANBERRY PO Take 500 mg by mouth daily.     Historical Provider, MD  cyanocobalamin (,VITAMIN B-12,) 1000 MCG/ML injection INJECT 1 ML INTO THE MUSCLE EVERY 30 DAYS 04/26/15   Biagio Borg, MD  donepezil (ARICEPT) 5 MG tablet Take 1 tablet (5 mg total) by mouth at bedtime. Patient taking differently: Take 10 mg by mouth at bedtime.  01/03/15   Biagio Borg, MD  feeding supplement, GLUCERNA SHAKE, (GLUCERNA SHAKE) LIQD Take 237 mLs by mouth 2 (two) times daily between meals. Patient taking differently: Take 237 mLs by mouth daily.  08/29/14   Velvet Bathe, MD  ferrous sulfate 325 (65 FE) MG tablet Take 1 tablet (325 mg total) by mouth 2 (two) times daily with a meal. 02/15/14   Ivan Anchors Love, PA-C  fluticasone (FLONASE) 50 MCG/ACT nasal spray Place 2 sprays into the nose daily. 03/22/13   Biagio Borg, MD  furosemide (LASIX) 20 MG tablet 1 tab by mouth per day as needed for swelling 12/05/14   Biagio Borg, MD  hydrALAZINE (APRESOLINE) 25 MG tablet Take 1 tablet (25 mg total) by mouth 3 (three) times daily. 12/13/14   Biagio Borg, MD  Hypromellose (ARTIFICIAL TEARS OP) Place 1 drop into both eyes daily.    Historical Provider, MD  insulin glargine (LANTUS) 100 UNIT/ML injection Inject 0.2 mLs (20 Units total) into the skin daily. 03/06/15   Biagio Borg, MD  labetalol (NORMODYNE) 200 MG tablet Take 400 mg by mouth 2 (two) times daily.    Historical Provider, MD  pantoprazole (PROTONIX) 40 MG tablet TAKE 1 BY MOUTH DAILY 09/12/14   Biagio Borg, MD  polyethylene glycol [facility] Floria Raveling) packet Take 17 g by mouth 2 (two) times daily. 02/15/14   Bary Leriche, PA-C  pravastatin (PRAVACHOL) 40 MG tablet TAKE 1/2 BY MOUTH DAILY 01/04/15   Biagio Borg, MD  warfarin (COUMADIN) 2 MG tablet Take as directed by  anticoagulation clinic 01/18/15   Biagio Borg, MD   BP 207/80 mmHg  Pulse 78  Temp(Src) 97.8 F (36.6 C) (Oral)  Resp 16  SpO2 97% Physical Exam  Constitutional: She appears well-developed.  Elderly, frail  HENT:  Head: Normocephalic and atraumatic.  Right Ear: External ear normal.  Left Ear: External ear normal.  Mouth/Throat: Oropharynx is clear and moist.  Eyes: Conjunctivae and EOM are normal. Pupils are equal, round, and reactive to light.  Neck: Normal range of motion and phonation normal. Neck supple.  Cardiovascular: Normal rate, regular rhythm and normal heart sounds.  Pulmonary/Chest: Effort normal and breath sounds normal. She exhibits no bony tenderness.  Abdominal: Soft. She exhibits no distension. There is no tenderness.  Musculoskeletal: Normal range of motion.  Neurological: She is alert. No cranial nerve deficit or sensory deficit. She exhibits normal muscle tone. Coordination normal.  Skin: Skin is warm, dry and intact.  Psychiatric: She has a normal mood and affect. Her behavior is normal.  Nursing note and vitals reviewed.   ED Course  Procedures (including critical care time)  Medications  0.9 %  sodium chloride infusion ( Intravenous New Bag/Given 07/01/15 1543)  sodium chloride 0.9 % bolus 500 mL (0 mLs Intravenous Stopped 07/01/15 1649)  cefTRIAXone (ROCEPHIN) 1 g in dextrose 5 % 50 mL IVPB (0 g Intravenous Stopped 07/01/15 1841)    Patient Vitals for the past 24 hrs:  BP Temp Temp src Pulse Resp SpO2  07/01/15 1900 187/75 mmHg - - 91 18 98 %  07/01/15 1830 188/76 mmHg - - 93 19 98 %  07/01/15 1745 187/79 mmHg - - 94 19 98 %  07/01/15 1715 181/78 mmHg - - 89 16 98 %  07/01/15 1645 171/77 mmHg - - 90 20 98 %  07/01/15 1630 182/72 mmHg - - 91 18 98 %  07/01/15 1600 177/67 mmHg - - 88 19 98 %  07/01/15 1437 (!) 207/80 mmHg 97.8 F (36.6 C) Oral 78 16 97 %   Obstetric blood pressure and pulses were borderline positive.  7:14 PM Reevaluation with  update and discussion. After initial assessment and treatment, an updated evaluation reveals no change in clinical status. She is tolerating food and fluid in the ED. Findings discussed with family members, all questions were answered.. [place] Review Labs Reviewed  CBG MONITORING, ED - Abnormal; Notable for the following:    Glucose-Capillary 145 (*)    All other components within normal limits  URINE CULTURE  BASIC METABOLIC PANEL  CBC  URINALYSIS, ROUTINE W REFLEX MICROSCOPIC (NOT AT [facility])  URINALYSIS W MICROSCOPIC    Imaging Review No results found. I have personally reviewed and evaluated these images and lab results as part of my medical decision-making.   EKG Interpretation   Date/Time:  Sunday July 01 2015 14:54:34 EDT Ventricular Rate:  81 PR Interval:  191 QRS Duration: 143 QT Interval:  441 QTC Calculation: 512 R Axis:   -76 Text Interpretation:  Sinus rhythm RBBB and LAFB Probable left ventricular  hypertrophy ST depression in inferior leads not significantly changed from  prior  Confirmed by LIU MD, DANA ([id number]) on 07/01/2015 2:59:37 PM      MDM   Final diagnoses:  Syncope, unspecified syncope type  Urinary tract infection without hematuria, site unspecified  Dehydration    Syncope, long commode, likely vagal. Possible comorbidities of mild dehydration, and UTI. Patient is chronically catheterized. Urine culture has been sent. Doubt serious patella, infection, pelvic instability or impending vascular collapse.  Nursing Notes Reviewed/ Care Coordinated Applicable Imaging Reviewed Interpretation of Laboratory Data incorporated into ED treatment  The patient appears reasonably screened and/or stabilized for discharge and I doubt any other medical condition or other [facility] requiring further screening, evaluation, or treatment in the ED at this time prior to discharge.  Plan: Home Medications- Keflex; Home Treatments- increase oral fluids;  return here if the recommended treatment, does not improve the symptoms; Recommended follow up- PCP prn     Daleen Bo, MD 07/01/15 [phone]

## 2015-07-01 NOTE — Discharge Instructions (Signed)
Encourage her to drink plenty of fluids. Start the antibiotic prescription, tomorrow.   Dehydration, Adult Dehydration means your body does not have as much fluid or water as it needs. It happens when you take in less fluid than you lose. Your kidneys, brain, and heart will not work properly without the right amount of fluids.  Dehydration can range from mild to severe. It should be treated right away to help prevent it from becoming severe. HOME CARE  Drink enough fluid to keep your pee (urine) clear or pale yellow.  Drink water or fluid slowly by taking small sips. You can also try sucking on ice cubes.  Have food or drinks that contain electrolytes. Examples include bananas and sports drinks.  Take over-the-counter and prescription medicines only as told by your doctor.  Prepare oral rehydration solution (ORS) according to the instructions that came with it. Take sips of ORS every 5 minutes until your pee returns to normal.  If you are throwing up (vomiting) or have watery poop (diarrhea), keep trying to drink water, ORS, or both.  If you have watery poop, avoid:  Drinks with caffeine.  Fruit juice.  Milk.  Carbonated soft drinks.  Do not take salt tablets. This can lead to having too much sodium in your body (hypernatremia). GET HELP IF:  You cannot eat or drink without throwing up.  You have had mild watery poop for longer than 24 hours.  You have a fever. GET HELP RIGHT AWAY IF:   You have very strong thirst.  You have very bad watery poop.  You have not peed in 6-8 hours, or you have peed only a small amount of very dark pee.  You have shriveled skin.  You are dizzy, confused, or both.   This information is not intended to replace advice given to you by your health care provider. Make sure you discuss any questions you have with your health care provider.   Document Released: 06/14/2009 Document Revised: 05/09/2015 Document Reviewed: 01/03/2015 Elsevier  Interactive Patient Education 2016 [place].  Dehydration Dehydration is when you lose more fluids from the body than you take in. Vital organs such as the kidneys, brain, and heart cannot function without a proper amount of fluids and salt. Any loss of fluids from the body can cause dehydration.  Older adults are at a higher risk of dehydration than younger adults. As we age, our bodies are less able to conserve water and do not respond to temperature changes as well. Also, older adults do not become thirsty as easily or quickly. Because of this, older adults often do not realize they need to increase fluids to avoid dehydration.  CAUSES   Vomiting.  Diarrhea.  Excessive sweating.  Excessive urination.  Fever.  Certain medicines, such as blood pressure medicines called diuretics.  Poorly controlled blood sugars. SIGNS AND SYMPTOMS  Mild dehydration:  Thirst.  Dry lips.  Slightly dry mouth. Moderate dehydration:  Very dry mouth.  Sunken eyes.  Skin does not bounce back quickly when lightly pinched and released.  Dark urine and decreased urine production.  Decreased tear production.  Headache. Severe dehydration:  Very dry mouth.  Extreme thirst.  Rapid, weak pulse (more than 100 beats per minute at rest).  Cold hands and feet.  Not able to sweat in spite of heat.  Rapid breathing.  Blue lips.  Confusion and lethargy.  Difficulty being awakened.  Minimal urine production.  No tears. DIAGNOSIS  Your health care provider will  diagnose dehydration based on your symptoms and your exam. Blood and urine tests will help confirm the diagnosis. The diagnostic evaluation should also identify the cause of dehydration. TREATMENT  Treatment of mild or moderate dehydration can often be done at home by increasing the amount of fluids that you drink. It is best to drink small amounts of fluid more often. Drinking too much at one time can make vomiting worse.  Severe dehydration needs to be treated at the hospital. You may be given IV fluids that contain water and electrolytes. HOME CARE INSTRUCTIONS   Ask your health care provider about specific rehydration instructions.  Drink enough fluids to keep your urine clear or pale yellow.  Drink small amounts frequently if you have nausea and vomiting.  Eat as you normally do.  Avoid:  Foods or drinks high in sugar.  Carbonated drinks.  Juice.  Extremely hot or cold fluids.  Drinks with caffeine.  Fatty, greasy foods.  Alcohol.  Tobacco.  Overeating.  Gelatin desserts.  Wash your hands well to avoid spreading bacteria and viruses.  Only take over-the-counter or prescription medicines for pain, discomfort, or fever as directed by your health care provider.  Ask your health care provider if you should continue all prescribed and over-the-counter medicines.  Keep all follow-up appointments with your health care provider. SEEK MEDICAL CARE IF:  You have abdominal pain, and it increases or stays in one area (localizes).  You have a rash, stiff neck, or severe headache.  You are irritable, sleepy, or difficult to awaken.  You are weak, dizzy, or extremely thirsty.  You have a fever. SEEK IMMEDIATE MEDICAL CARE IF:   You are unable to keep fluids down, or you get worse despite treatment.  You have frequent episodes of vomiting or diarrhea.  You have blood or green matter (bile) in your vomit.  You have blood in your stool, or your stool looks black and tarry.  You have not urinated in 6-8 hours, or you have only urinated a small amount of very dark urine.  You faint. MAKE SURE YOU:   Understand these instructions.  Will watch your condition.  Will get help right away if you are not doing well or get worse.   This information is not intended to replace advice given to you by your health care provider. Make sure you discuss any questions you have with your health  care provider.   Document Released: 11/08/2003 Document Revised: 08/23/2013 Document Reviewed: 04/25/2013 Elsevier Interactive Patient Education 2016 [employer].  Syncope Syncope means a person passes out (faints). The person usually wakes up in less than 5 minutes. It is important to seek medical care for syncope. HOME CARE  Have someone stay with you until you feel normal.  Do not drive, use machines, or play sports until your doctor says it is okay.  Keep all doctor visits as told.  Lie down when you feel like you might pass out. Take deep breaths. Wait until you feel normal before standing up.  Drink enough fluids to keep your pee (urine) clear or pale yellow.  If you take blood pressure or heart medicine, get up slowly. Take several minutes to sit and then stand. GET HELP RIGHT AWAY IF:   You have a severe headache.  You have pain in the chest, belly (abdomen), or back.  You are bleeding from the mouth or butt (rectum).  You have black or tarry poop (stool).  You have an irregular or very fast  heartbeat.  You have pain with breathing.  You keep passing out, or you have shaking (seizures) when you pass out.  You pass out when sitting or lying down.  You feel confused.  You have trouble walking.  You have severe weakness.  You have vision problems. If you fainted, call for help (911 in [place] Do not drive yourself to the hospital.   This information is not intended to replace advice given to you by your health care provider. Make sure you discuss any questions you have with your health care provider.   Document Released: 02/04/2008 Document Revised: 01/02/2015 Document Reviewed: 10/17/2011 Elsevier Interactive Patient Education 2016 Elsevier Inc.  Urinary Tract Infection A urinary tract infection (UTI) can occur any place along the urinary tract. The tract includes the kidneys, ureters, bladder, and urethra. A type of germ called bacteria often causes a  UTI. UTIs are often helped with antibiotic medicine.  HOME CARE   If given, take antibiotics as told by your doctor. Finish them even if you start to feel better.  Drink enough fluids to keep your pee (urine) clear or pale yellow.  Avoid tea, drinks with caffeine, and bubbly (carbonated) drinks.  Pee often. Avoid holding your pee in for a long time.  Pee before and after having sex (intercourse).  Wipe from front to back after you poop (bowel movement) if you are a woman. Use each tissue only once. GET HELP RIGHT AWAY IF:   You have back pain.  You have lower belly (abdominal) pain.  You have chills.  You feel sick to your stomach (nauseous).  You throw up (vomit).  Your burning or discomfort with peeing does not go away.  You have a fever.  Your symptoms are not better in 3 days. MAKE SURE YOU:   Understand these instructions.  Will watch your condition.  Will get help right away if you are not doing well or get worse.   This information is not intended to replace advice given to you by your health care provider. Make sure you discuss any questions you have with your health care provider.   Document Released: 02/04/2008 Document Revised: 09/08/2014 Document Reviewed: 03/18/2012 Elsevier Interactive Patient Education [employer].

## 2015-07-01 NOTE — ED Notes (Signed)
Attempted report x1. 

## 2015-07-01 NOTE — ED Notes (Addendum)
Rochelle from lab called to report on 1st result of u/a the WBC said "too numerous to count".  Actual results are WBC 21-50 and RBC 3-6.  Advised Dr. Eulis Foster.

## 2015-07-02 LAB — URINE CULTURE

## 2015-07-06 ENCOUNTER — Emergency Department (HOSPITAL_COMMUNITY): Payer: Medicare Other

## 2015-07-06 ENCOUNTER — Telehealth: Payer: Self-pay | Admitting: Internal Medicine

## 2015-07-06 ENCOUNTER — Inpatient Hospital Stay (HOSPITAL_COMMUNITY): Payer: Medicare Other

## 2015-07-06 ENCOUNTER — Inpatient Hospital Stay (HOSPITAL_COMMUNITY)
Admission: EM | Admit: 2015-07-06 | Discharge: 2015-07-10 | DRG: 193 | Disposition: A | Discharge status: Skilled Nursing Facility | Payer: Medicare Other | Attending: Internal Medicine | Admitting: Internal Medicine

## 2015-07-06 ENCOUNTER — Encounter (HOSPITAL_COMMUNITY): Payer: Self-pay | Admitting: General Practice

## 2015-07-06 DIAGNOSIS — R110 Nausea: Secondary | ICD-10-CM | POA: Diagnosis not present

## 2015-07-06 DIAGNOSIS — I5032 Chronic diastolic (congestive) heart failure: Secondary | ICD-10-CM | POA: Diagnosis present

## 2015-07-06 DIAGNOSIS — J189 Pneumonia, unspecified organism: Principal | ICD-10-CM | POA: Diagnosis present

## 2015-07-06 DIAGNOSIS — E860 Dehydration: Secondary | ICD-10-CM | POA: Diagnosis present

## 2015-07-06 DIAGNOSIS — Z7901 Long term (current) use of anticoagulants: Secondary | ICD-10-CM

## 2015-07-06 DIAGNOSIS — I160 Hypertensive urgency: Secondary | ICD-10-CM | POA: Diagnosis present

## 2015-07-06 DIAGNOSIS — F0390 Unspecified dementia without behavioral disturbance: Secondary | ICD-10-CM | POA: Diagnosis present

## 2015-07-06 DIAGNOSIS — H9190 Unspecified hearing loss, unspecified ear: Secondary | ICD-10-CM | POA: Diagnosis present

## 2015-07-06 DIAGNOSIS — I161 Hypertensive emergency: Secondary | ICD-10-CM | POA: Diagnosis not present

## 2015-07-06 DIAGNOSIS — Z86718 Personal history of other venous thrombosis and embolism: Secondary | ICD-10-CM

## 2015-07-06 DIAGNOSIS — K5900 Constipation, unspecified: Secondary | ICD-10-CM | POA: Diagnosis present

## 2015-07-06 DIAGNOSIS — E1140 Type 2 diabetes mellitus with diabetic neuropathy, unspecified: Secondary | ICD-10-CM | POA: Diagnosis present

## 2015-07-06 DIAGNOSIS — Z794 Long term (current) use of insulin: Secondary | ICD-10-CM

## 2015-07-06 DIAGNOSIS — K219 Gastro-esophageal reflux disease without esophagitis: Secondary | ICD-10-CM | POA: Diagnosis present

## 2015-07-06 DIAGNOSIS — G9340 Encephalopathy, unspecified: Secondary | ICD-10-CM | POA: Diagnosis not present

## 2015-07-06 DIAGNOSIS — Z86711 Personal history of pulmonary embolism: Secondary | ICD-10-CM

## 2015-07-06 DIAGNOSIS — N390 Urinary tract infection, site not specified: Secondary | ICD-10-CM | POA: Diagnosis not present

## 2015-07-06 DIAGNOSIS — H9192 Unspecified hearing loss, left ear: Secondary | ICD-10-CM | POA: Diagnosis present

## 2015-07-06 DIAGNOSIS — R1031 Right lower quadrant pain: Secondary | ICD-10-CM | POA: Diagnosis not present

## 2015-07-06 DIAGNOSIS — R5383 Other fatigue: Secondary | ICD-10-CM | POA: Diagnosis present

## 2015-07-06 DIAGNOSIS — Z66 Do not resuscitate: Secondary | ICD-10-CM | POA: Diagnosis present

## 2015-07-06 DIAGNOSIS — Z23 Encounter for immunization: Secondary | ICD-10-CM | POA: Diagnosis not present

## 2015-07-06 DIAGNOSIS — I110 Hypertensive heart disease with heart failure: Secondary | ICD-10-CM | POA: Diagnosis present

## 2015-07-06 DIAGNOSIS — Z6820 Body mass index (BMI) 20.0-20.9, adult: Secondary | ICD-10-CM

## 2015-07-06 DIAGNOSIS — N179 Acute kidney failure, unspecified: Secondary | ICD-10-CM | POA: Diagnosis present

## 2015-07-06 DIAGNOSIS — R4182 Altered mental status, unspecified: Secondary | ICD-10-CM

## 2015-07-06 DIAGNOSIS — E43 Unspecified severe protein-calorie malnutrition: Secondary | ICD-10-CM | POA: Diagnosis present

## 2015-07-06 DIAGNOSIS — D509 Iron deficiency anemia, unspecified: Secondary | ICD-10-CM | POA: Diagnosis present

## 2015-07-06 DIAGNOSIS — Z79899 Other long term (current) drug therapy: Secondary | ICD-10-CM

## 2015-07-06 DIAGNOSIS — Z960 Presence of urogenital implants: Secondary | ICD-10-CM

## 2015-07-06 DIAGNOSIS — Z978 Presence of other specified devices: Secondary | ICD-10-CM

## 2015-07-06 HISTORY — DX: Presence of other specified devices: Z97.8

## 2015-07-06 HISTORY — DX: Presence of urogenital implants: Z96.0

## 2015-07-06 LAB — CBC WITH DIFFERENTIAL/PLATELET
BASOS ABS: 0 10*3/uL (ref 0.0–0.1)
BASOS PCT: 0 %
EOS ABS: 0.1 10*3/uL (ref 0.0–0.7)
Eosinophils Relative: 2 %
HCT: 35.6 % — ABNORMAL LOW (ref 36.0–46.0)
HEMOGLOBIN: 11.5 g/dL — AB (ref 12.0–15.0)
LYMPHS PCT: 13 %
Lymphs Abs: 0.6 10*3/uL — ABNORMAL LOW (ref 0.7–4.0)
MCH: 28.3 pg (ref 26.0–34.0)
MCHC: 32.3 g/dL (ref 30.0–36.0)
MCV: 87.5 fL (ref 78.0–100.0)
MONO ABS: 0.4 10*3/uL (ref 0.1–1.0)
Monocytes Relative: 9 %
NEUTROS PCT: 76 %
Neutro Abs: 3.7 10*3/uL (ref 1.7–7.7)
PLATELETS: ADEQUATE 10*3/uL (ref 150–400)
RBC: 4.07 MIL/uL (ref 3.87–5.11)
RDW: 15.1 % (ref 11.5–15.5)
SMEAR REVIEW: ADEQUATE
WBC: 4.8 10*3/uL (ref 4.0–10.5)

## 2015-07-06 LAB — COMPREHENSIVE METABOLIC PANEL
ALT: 20 U/L (ref 14–54)
ANION GAP: 8 (ref 5–15)
AST: 22 U/L (ref 15–41)
Albumin: 3.1 g/dL — ABNORMAL LOW (ref 3.5–5.0)
Alkaline Phosphatase: 55 U/L (ref 38–126)
BUN: 23 mg/dL — ABNORMAL HIGH (ref 6–20)
CALCIUM: 9.1 mg/dL (ref 8.9–10.3)
CHLORIDE: 109 mmol/L (ref 101–111)
CO2: 21 mmol/L — AB (ref 22–32)
Creatinine, Ser: 0.98 mg/dL (ref 0.44–1.00)
GFR, EST AFRICAN AMERICAN: 56 mL/min — AB (ref >60)
GFR, EST NON AFRICAN AMERICAN: 49 mL/min — AB (ref >60)
Glucose, Bld: 126 mg/dL — ABNORMAL HIGH (ref 65–99)
Potassium: 4.2 mmol/L (ref 3.5–5.1)
SODIUM: 138 mmol/L (ref 135–145)
Total Bilirubin: 0.6 mg/dL (ref 0.3–1.2)
Total Protein: 5 g/dL — ABNORMAL LOW (ref 6.5–8.1)

## 2015-07-06 LAB — URINALYSIS, ROUTINE W REFLEX MICROSCOPIC
Bilirubin Urine: NEGATIVE
GLUCOSE, UA: NEGATIVE mg/dL
Ketones, ur: NEGATIVE mg/dL
NITRITE: POSITIVE — AB
PH: 7 (ref 5.0–8.0)
PROTEIN: 30 mg/dL — AB
Specific Gravity, Urine: 1.008 (ref 1.005–1.030)
UROBILINOGEN UA: 0.2 mg/dL (ref 0.0–1.0)

## 2015-07-06 LAB — URINE MICROSCOPIC-ADD ON

## 2015-07-06 LAB — GLUCOSE, CAPILLARY
Glucose-Capillary: 151 mg/dL — ABNORMAL HIGH (ref 65–99)
Glucose-Capillary: 107 mg/dL — ABNORMAL HIGH (ref 65–99)

## 2015-07-06 LAB — PROTIME-INR
INR: 2.2 — AB (ref 0.00–1.49)
PROTHROMBIN TIME: 24.2 s — AB (ref 11.6–15.2)

## 2015-07-06 MED ORDER — PANTOPRAZOLE SODIUM 40 MG PO TBEC
40.0000 mg | DELAYED_RELEASE_TABLET | Freq: Every day | ORAL | Status: DC
  Administered 2015-07-07 – 2015-07-10 (×4): 40 mg via ORAL
  Filled 2015-07-06 (×4): qty 1

## 2015-07-06 MED ORDER — DEXTROSE 5 % IV SOLN
500.0000 mg | Freq: Once | INTRAVENOUS | Status: AC
  Administered 2015-07-06: 500 mg via INTRAVENOUS
  Filled 2015-07-06: qty 500

## 2015-07-06 MED ORDER — PROMETHAZINE HCL 25 MG/ML IJ SOLN
6.2500 mg | Freq: Four times a day (QID) | INTRAMUSCULAR | Status: DC | PRN
  Administered 2015-07-06: 6.25 mg via INTRAVENOUS
  Filled 2015-07-06: qty 1

## 2015-07-06 MED ORDER — WARFARIN SODIUM 2 MG PO TABS
2.0000 mg | ORAL_TABLET | ORAL | Status: DC
  Administered 2015-07-07: 2 mg via ORAL
  Filled 2015-07-06 (×3): qty 1

## 2015-07-06 MED ORDER — POLYETHYLENE GLYCOL 3350 17 G PO PACK: 17.0000 g | PACK | Freq: Every day | ORAL | Status: DC

## 2015-07-06 MED ORDER — CLONIDINE HCL 0.3 MG/24HR TD PTWK: 0.3000 mg | MEDICATED_PATCH | TRANSDERMAL | Status: DC

## 2015-07-06 MED ORDER — DEXTROSE 5 % IV SOLN
1.0000 g | Freq: Once | INTRAVENOUS | Status: AC
  Administered 2015-07-06: 1 g via INTRAVENOUS
  Filled 2015-07-06: qty 10

## 2015-07-06 MED ORDER — WARFARIN - PHARMACIST DOSING INPATIENT: Freq: Every day | Status: DC

## 2015-07-06 MED ORDER — DEXTROSE 5 % IV SOLN
500.0000 mg | INTRAVENOUS | Status: DC
  Administered 2015-07-07 – 2015-07-10 (×4): 500 mg via INTRAVENOUS
  Filled 2015-07-06 (×5): qty 500

## 2015-07-06 MED ORDER — ONDANSETRON HCL 4 MG/2ML IJ SOLN
4.0000 mg | Freq: Four times a day (QID) | INTRAMUSCULAR | Status: DC | PRN
  Administered 2015-07-06 – 2015-07-09 (×2): 4 mg via INTRAVENOUS
  Filled 2015-07-06 (×2): qty 2

## 2015-07-06 MED ORDER — ONDANSETRON HCL 4 MG PO TABS: 4.0000 mg | ORAL_TABLET | Freq: Four times a day (QID) | ORAL | Status: DC | PRN

## 2015-07-06 MED ORDER — SODIUM CHLORIDE 0.9 % IJ SOLN
3.0000 mL | Freq: Two times a day (BID) | INTRAMUSCULAR | Status: DC
  Administered 2015-07-07 – 2015-07-09 (×5): 3 mL via INTRAVENOUS

## 2015-07-06 MED ORDER — ONDANSETRON HCL 4 MG/2ML IJ SOLN
4.0000 mg | Freq: Once | INTRAMUSCULAR | Status: AC
Start: 2015-07-06 — End: 2015-07-06
  Administered 2015-07-06: 4 mg via INTRAVENOUS
  Filled 2015-07-06: qty 2

## 2015-07-06 MED ORDER — WARFARIN SODIUM 4 MG PO TABS: 4.0000 mg | ORAL_TABLET | ORAL | Status: DC

## 2015-07-06 MED ORDER — ALUM & MAG HYDROXIDE-SIMETH 200-200-20 MG/5ML PO SUSP
30.0000 mL | Freq: Four times a day (QID) | ORAL | Status: DC | PRN
  Administered 2015-07-09: 30 mL via ORAL
  Filled 2015-07-06: qty 30

## 2015-07-06 MED ORDER — PRAVASTATIN SODIUM 20 MG PO TABS
20.0000 mg | ORAL_TABLET | Freq: Every day | ORAL | Status: DC
  Administered 2015-07-07 – 2015-07-10 (×4): 20 mg via ORAL
  Filled 2015-07-06 (×4): qty 1

## 2015-07-06 MED ORDER — ACETAMINOPHEN 325 MG PO TABS
650.0000 mg | ORAL_TABLET | Freq: Four times a day (QID) | ORAL | Status: DC | PRN
  Administered 2015-07-08: 650 mg via ORAL
  Filled 2015-07-06: qty 2

## 2015-07-06 MED ORDER — ENSURE ENLIVE PO LIQD
237.0000 mL | Freq: Two times a day (BID) | ORAL | Status: DC
  Administered 2015-07-07 – 2015-07-10 (×5): 237 mL via ORAL

## 2015-07-06 MED ORDER — INSULIN GLARGINE 100 UNIT/ML ~~LOC~~ SOLN
15.0000 [IU] | Freq: Every day | SUBCUTANEOUS | Status: DC
  Administered 2015-07-08 – 2015-07-10 (×3): 15 [IU] via SUBCUTANEOUS
  Filled 2015-07-06 (×5): qty 0.15

## 2015-07-06 MED ORDER — HYDRALAZINE HCL 25 MG PO TABS
25.0000 mg | ORAL_TABLET | Freq: Three times a day (TID) | ORAL | Status: DC
  Administered 2015-07-07: 25 mg via ORAL
  Filled 2015-07-06: qty 1

## 2015-07-06 MED ORDER — LABETALOL HCL 200 MG PO TABS
400.0000 mg | ORAL_TABLET | Freq: Two times a day (BID) | ORAL | Status: DC
  Administered 2015-07-07 – 2015-07-10 (×7): 400 mg via ORAL
  Filled 2015-07-06 (×7): qty 2

## 2015-07-06 MED ORDER — SODIUM CHLORIDE 0.9 % IV SOLN
INTRAVENOUS | Status: AC
  Administered 2015-07-06: 18:00:00 via INTRAVENOUS

## 2015-07-06 MED ORDER — ARTIFICIAL TEARS OP OINT
1.0000 | TOPICAL_OINTMENT | Freq: Every day | OPHTHALMIC | Status: DC | PRN
Start: 2015-07-06 — End: 2015-07-10
  Filled 2015-07-06: qty 3.5

## 2015-07-06 MED ORDER — DEXTROSE 5 % IV SOLN
1.0000 g | INTRAVENOUS | Status: DC
  Administered 2015-07-07 – 2015-07-10 (×4): 1 g via INTRAVENOUS
  Filled 2015-07-06 (×5): qty 10

## 2015-07-06 MED ORDER — ACETAMINOPHEN 650 MG RE SUPP: 650.0000 mg | Freq: Four times a day (QID) | RECTAL | Status: DC | PRN

## 2015-07-06 MED ORDER — HYDRALAZINE HCL 20 MG/ML IJ SOLN
10.0000 mg | Freq: Four times a day (QID) | INTRAMUSCULAR | Status: DC | PRN
  Administered 2015-07-06 – 2015-07-07 (×3): 10 mg via INTRAVENOUS
  Filled 2015-07-06 (×3): qty 1

## 2015-07-06 MED ORDER — FUROSEMIDE 20 MG PO TABS: 20.0000 mg | ORAL_TABLET | Freq: Every day | ORAL | Status: DC

## 2015-07-06 NOTE — ED Notes (Signed)
Pt remains monitored by blood pressure, pulse ox, and 5 lead. pts family remains at bedside.  

## 2015-07-06 NOTE — ED Notes (Signed)
Pt placed on monitor upon return to room from radiology. Pt remains monitored by blood pressure, pulse ox, and 5 lead. pts family remains at bedside.  

## 2015-07-06 NOTE — ED Notes (Signed)
Pt placed on monitor. Pt monitored by blood pressure, pulse ox, and 5 lead. pts family remains at bedside.

## 2015-07-06 NOTE — Telephone Encounter (Signed)
PLEASE NOTE: All timestamps contained within this report are represented as [place] Standard Time. CONFIDENTIALTY NOTICE: This fax transmission is intended only for the addressee. It contains information that is legally privileged, confidential or otherwise protected from use or disclosure. If you are not the intended recipient, you are strictly prohibited from reviewing, disclosing, copying using or disseminating any of this information or taking any action in reliance on or regarding this information. If you have received this fax in error, please notify us immediately by telephone so that we can arrange for its return to [place]. Phone: [phone], Toll-Free: [phone], Fax: [phone] Page: 1 of 1 Call Id: [id number] [place] [facility] - Client [place] Patient Name: Lindsay Sheppard DOB: 10/12/1920 Initial Comment Caller states mother blacked out Sunday, transported to [facility]. Told no stroke, no heart attack, blood work okay. Dx UTI, rx abx. Nauseated Monday, ankles swollen, nauseated since. Having leg cramps and has hx of CHF. Nurse Assessment Nurse: Markus Daft, RN, Sherre Poot Date/Time (Eastern Time): 07/06/2015 9:06:31 AM Confirm and document reason for call. If symptomatic, describe symptoms. ---Caller states mother blacked out Sunday, call 911, transported to [facility] ER. Testing done and advised r/o CVA or MI and blood work okay. She has a cont. Foley catheter. Diagnosed with UTI and given antibiotics including IV Rocephin at hospital. She is taking Keflex orally. C/o nausea since Monday noticed after medications. She is taking food before Keflex, and did not notice every time. Breathing seems more rapid and shallow - but not barely breathing. She ankles swollen - always greater on the right than the left. Having leg cramps. BP this AM 175/89 and HR 78 laying down. Has the patient traveled out of the country within the last  30 days? ---Not Applicable Does the patient have any new or worsening symptoms? ---Yes Will a triage be completed? ---Yes Related visit to physician within the last 2 weeks? ---Yes Does the PT have any chronic conditions? (i.e. diabetes, asthma, etc.) ---Yes List chronic conditions. ---CHF; bladder problems - not working correctly; IDDM; HTN; Tumor on colon. Guidelines Guideline Title Affirmed Question Affirmed Notes Urinary Tract Infection on Antibiotic Follow-up Call - Female Diabetes mellitus or weak immune system (e.g., HIV positive, cancer chemotherapy, transplant patient) High Blood Pressure Difficult to awaken or acting confused (e.g., disoriented, slurred speech) --->periods of confusion when pt is sick; calling her dtr's by the wrong name; confused to time Final Disposition User Call EMS 911 Now Markus Daft, RN, [place] Disagree/Comply: Comply Disagree/Comply: Leta Baptist

## 2015-07-06 NOTE — ED Provider Notes (Signed)
CSN: [id number]     Arrival date & time 07/06/15  1056 History   First MD Initiated Contact with Patient 07/06/15 1100     Chief Complaint  Patient presents with  . Altered Mental Status     (Consider location/radiation/quality/duration/timing/severity/associated sxs/prior Treatment) Patient is a 81 y.o. female presenting with altered mental status. The history is provided by the patient and a relative.  Altered Mental Status Presenting symptoms: lethargy   Severity:  Moderate Most recent episode:  Today Timing:  Intermittent Progression:  Waxing and waning Context: dementia, recent illness and recent infection   Associated symptoms: nausea    History is limited due to the patient's mental status. Patient has underlying dementia. Per chart review, patient was seen in the ED 5 days ago for a syncopal event. She was found to have a UTI and started on keflex. Family deny any head trauma at that time. Patient has a history of chronic indwelling foley catheter. Family reports that she had been a little better over the past few days until this morning we she was very lethargic and had difficulty getting out of bed. Family also reports that she had severe leg cramps this morning. Family called the patient's PCP who recommended that the patient be brought to the ED for further evaluation. The family also reports that the patient has had nausea the past few days without emesis. She has had a normal appetite otherwise. No diarrhea.  No fevers or chills. No focal weakness. No rashes.  Family reports that she has a history of aspiration and they have noticed her having more shallow breathing over the past few days.   Past Medical History  Diagnosis Date  . POSTHERPETIC NEURALGIA 11/02/2009  . B12 DEFICIENCY 05/23/2007  . HYPERLIPIDEMIA 05/23/2007  . HYPERKALEMIA 05/30/2010  . ANEMIA-IRON DEFICIENCY 01/26/2007  . BLEPHARITIS, BILATERAL 02/23/2008  . Other specified forms of hearing loss 05/30/2010  .  HYPERTENSION 01/26/2007  . Atrial fibrillation ([place]) 05/23/2007  . DIASTOLIC HEART FAILURE, CHRONIC 12/14/2009  . POSTURAL HYPOTENSION 02/07/2008  . ALLERGIC RHINITIS 11/02/2009  . GERD 01/26/2007  . CONSTIPATION 01/25/2008  . SKIN LESION 05/30/2010  . BACK PAIN 08/02/2007  . OSTEOPOROSIS 05/23/2007  . SYNCOPE 02/07/2008  . FATIGUE 09/06/2007  . Dysuria 09/05/2008  . Abdominal pain, epigastric 09/06/2007  . EPIGASTRIC TENDERNESS 10/12/2007  . PULMONARY EMBOLISM, HX OF 05/23/2007  . SHINGLES, HX OF 05/23/2007  . 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 Left foot pain   . Diabetic neuropathy ([place])   . RENAL INSUFFICIENCY 07/02/2009  . RENAL CYST, LEFT 09/06/2007  . Kidney stones     "never had OR"  . Family history of anesthesia complication     "daughter has PONV; another daughter a little anesthesia lasts way too long"  . CHF (congestive heart failure) ([place]) 2011  . Myocardial infarction [facility]) 2011    "mild"  . DVT (deep venous thrombosis) ([place]) ~ 1964    "think it was in the left"  . Aspiration pneumonia ([place]) ~ 2006    "due to aspiration"  . DIABETES MELLITUS, TYPE II 09/06/2007  . DEPRESSIVE DISORDER 01/25/2008    "@ times; never treated for it"  . Melanoma of nose ([place])   . Compression fracture of lumbar vertebra ([facility])     hx  . Compression fracture of thoracic vertebra ([place]) 02/07/2014    "fell this am" (02/07/2014  . Arthritis     "hands" (03/15/2014)   Past Surgical History  Procedure Laterality Date  .  Appendectomy  1943  . Abdominal hysterectomy  1964  . Cardiac electrophysiology mapping and ablation  1999  . Cataract extraction w/ intraocular lens  implant, bilateral  2003-2005  . Carpal tunnel release Right 2004  . Mohs surgery  2011    "removed off her nose"  . Cardiac catheterization  10/2009  . Esophageal dilation  X 2   Family History  Problem Relation Age of Onset  . Breast cancer Mother   . Colon cancer Father   . Stroke Father     died with stroke postop  . Diabetes Sister      2 sisters  . Hypertension Other    Social History  Substance Use Topics  . Smoking status: Never Smoker   . Smokeless tobacco: Never Used  . Alcohol Use: No   OB History    No data available     Review of Systems  Constitutional: Negative.   HENT: Negative.   Eyes: Negative.   Respiratory: Negative.   Cardiovascular: Negative.   Gastrointestinal: Positive for nausea.  Endocrine: Positive for cold intolerance.  Genitourinary: Negative.   Musculoskeletal: Negative.   Skin: Negative.   Allergic/Immunologic: Negative.   Neurological: Negative.   Hematological: Negative.       Allergies  Amiodarone; Deltasone; Diltiazem hcl; Levofloxacin; Pregabalin; Spironolactone; Tequin; and Diazepam  Home Medications   Prior to Admission medications   Medication Sig Start Date End Date Taking? Authorizing Provider  acetaminophen (TYLENOL) 325 MG tablet Take 2 tablets (650 mg total) by mouth 3 (three) times daily. Patient taking differently: Take 650 mg by mouth at bedtime as needed for mild pain.  02/09/14  Yes Delfina Redwood, MD  cephALEXin (KEFLEX) 500 MG capsule Take 1 capsule (500 mg total) by mouth 4 (four) times daily. 07/01/15  Yes Daleen Bo, MD  cloNIDine (CATAPRES - DOSED IN MG/24 HR) 0.3 mg/24hr patch Place 0.3 mg onto the skin once a week.   Yes Historical Provider, MD  CRANBERRY PO Take 500 mg by mouth daily.    Yes Historical Provider, MD  cyanocobalamin (,VITAMIN B-12,) 1000 MCG/ML injection INJECT 1 ML INTO THE MUSCLE EVERY 30 DAYS 04/26/15  Yes Biagio Borg, MD  donepezil (ARICEPT) 5 MG tablet Take 1 tablet (5 mg total) by mouth at bedtime. Patient taking differently: Take 10 mg by mouth at bedtime.  01/03/15  Yes Biagio Borg, MD  ferrous sulfate 325 (65 FE) MG tablet Take 1 tablet (325 mg total) by mouth 2 (two) times daily with a meal. 02/15/14  Yes Ivan Anchors Love, PA-C  hydrALAZINE (APRESOLINE) 25 MG tablet Take 1 tablet (25 mg total) by mouth 3 (three) times  daily. 12/13/14  Yes Biagio Borg, MD  insulin glargine (LANTUS) 100 UNIT/ML injection Inject 0.2 mLs (20 Units total) into the skin daily. 03/06/15  Yes Biagio Borg, MD  labetalol (NORMODYNE) 200 MG tablet Take 400 mg by mouth 2 (two) times daily.   Yes Historical Provider, MD  pantoprazole (PROTONIX) 40 MG tablet TAKE 1 BY MOUTH DAILY Patient taking differently: TAKE 1 tablet BY MOUTH DAILY 09/12/14  Yes Biagio Borg, MD  polyethylene glycol [facility] Floria Raveling) packet Take 17 g by mouth 2 (two) times daily. Patient taking differently: Take 17 g by mouth daily.  02/15/14  Yes Ivan Anchors Love, PA-C  pravastatin (PRAVACHOL) 40 MG tablet TAKE 1/2 BY MOUTH DAILY Patient taking differently: TAKE 1/2 tablet BY MOUTH DAILY 01/04/15  Yes Biagio Borg, MD  warfarin (COUMADIN) 2 MG tablet Take as directed by anticoagulation clinic Patient taking differently: Take 2-4 mg by mouth daily at 6 PM. Take 1 tablet all days Except Thursday take 2 tablets 01/18/15  Yes Biagio Borg, MD  ACCU-CHEK COMPACT PLUS test strip  12/19/14   Historical Provider, MD  amoxicillin-clavulanate (AUGMENTIN) 875-125 MG per tablet Take 1 tablet by mouth 2 (two) times daily. Patient not taking: Reported on 05/09/2015 04/19/15   Biagio Borg, MD  B-D INS SYRINGE 0.5CC/31GX5/16 31G X 5/16" 0.5 ML MISC  02/06/15   Historical Provider, MD  feeding supplement, GLUCERNA SHAKE, (GLUCERNA SHAKE) LIQD Take 237 mLs by mouth 2 (two) times daily between meals. Patient not taking: Reported on 07/06/2015 08/29/14   Velvet Bathe, MD  fluticasone [facility]) 50 MCG/ACT nasal spray Place 2 sprays into the nose daily. Patient not taking: Reported on 07/06/2015 03/22/13   Biagio Borg, MD  furosemide (LASIX) 20 MG tablet 1 tab by mouth per day as needed for swelling 12/05/14   Biagio Borg, MD  Hypromellose (ARTIFICIAL TEARS OP) Place 1 drop into both eyes daily as needed. For dry eyes    Historical Provider, MD   BP 225/87 mmHg  Pulse 82  Resp 20  SpO2  97% Physical Exam  Constitutional: She is cooperative. She appears ill. No distress.  HENT:  Head: Normocephalic and atraumatic.  Eyes: EOM are normal. Pupils are equal, round, and reactive to light.  Neck: Normal range of motion. Neck supple.  Cardiovascular: An irregularly irregular rhythm present.  Pulmonary/Chest: Breath sounds normal. Tachypnea noted. No respiratory distress.  Abdominal: Soft. Bowel sounds are normal. She exhibits no distension.  Musculoskeletal: Normal range of motion.  Neurological: She is alert. She has normal strength. She is disoriented (not oriented to year. Oriented to person and place. ). No cranial nerve deficit or sensory deficit.  Nursing note and vitals reviewed.   ED Course  Procedures (including critical care time) Labs Review Labs Reviewed  CBC WITH DIFFERENTIAL/PLATELET - Abnormal; Notable for the following:    Hemoglobin 11.5 (*)    HCT 35.6 (*)    Lymphs Abs 0.6 (*)    All other components within normal limits  PROTIME-INR - Abnormal; Notable for the following:    Prothrombin Time 24.2 (*)    INR 2.20 (*)    All other components within normal limits  URINALYSIS, ROUTINE W REFLEX MICROSCOPIC (NOT AT [facility]) - Abnormal; Notable for the following:    Hgb urine dipstick SMALL (*)    Protein, ur 30 (*)    Nitrite POSITIVE (*)    Leukocytes, UA SMALL (*)    All other components within normal limits  COMPREHENSIVE METABOLIC PANEL - Abnormal; Notable for the following:    CO2 21 (*)    Glucose, Bld 126 (*)    BUN 23 (*)    Total Protein 5.0 (*)    Albumin 3.1 (*)    GFR calc non Af Amer 49 (*)    GFR calc Af Amer 56 (*)    All other components within normal limits  CULTURE, BLOOD (ROUTINE X 2)  CULTURE, BLOOD (ROUTINE X 2)  URINE CULTURE  URINE MICROSCOPIC-ADD ON    Imaging Review Dg Chest 2 View  07/06/2015  CLINICAL DATA:  Altered mental status EXAM: CHEST  2 VIEW COMPARISON:  September 04, 2014 FINDINGS: There is patchy airspace  consolidation in the left base with minimal left effusion. Lungs elsewhere clear. Heart size and  pulmonary vascularity are normal. No adenopathy. There is atherosclerotic change in aorta. There is anterior wedging of several thoracic vertebral bodies. IMPRESSION: Left base airspace consolidation. Followup PA and lateral chest radiographs recommended in 3-4 weeks following trial of antibiotic therapy to ensure resolution and exclude underlying malignancy. Electronically Signed   By: Lowella Grip III M.D.   On: 07/06/2015 12:57   Ct Head Wo Contrast  07/06/2015  CLINICAL DATA:  Increased fatigue, lethargy and confusion EXAM: CT HEAD WITHOUT CONTRAST TECHNIQUE: Contiguous axial images were obtained from the base of the skull through the vertex without contrast. COMPARISON:  08/27/2014 . FINDINGS: stable diffuse brain atrophy pattern and chronic periventricular white matter microvascular ischemic change. No acute intracranial hemorrhage, mass lesion, definite infarction, midline shift, herniation, hydrocephalus, or extra-axial fluid collection. No focal mass effect or edema. Mild cerebellar atrophy as well. Orbits are symmetric. Sinuses and mastoids remain clear. Skull appears intact IMPRESSION: Stable atrophy pattern and minor periventricular white matter microvascular ischemic change. No interval change or acute process by noncontrast CT. Electronically Signed   By: Jerilynn Mages.  Shick M.D.   On: 07/06/2015 12:56   I have personally reviewed and evaluated these images and lab results as part of my medical decision-making.   EKG Interpretation None      MDM   Final diagnoses:  Altered mental status  CAP (community acquired pneumonia)    69:35 AM 81 year old female with complex PMH, recently diagnosed with UTI and treated with keflex 5 days ago, who presents with lethargy and increased confusion per the patient's family. Will check CBC, BMP, UA, head CT, and CXR.   2:03 PM CXR with pneumonia. Will  obtain blood cultures, start CAP coverage abx, and admit to triad.   Algis Greenhouse. Jerline Pain, [place] Resident PGY-2    Vivi Barrack, MD 07/06/15 1404  Elnora Morrison, MD 07/06/15 [phone]

## 2015-07-06 NOTE — ED Notes (Signed)
Patient transported to CT 

## 2015-07-06 NOTE — ED Notes (Signed)
Patient to [facility] ED peer EMS with C/O increased fatigue, lethargy and confusion.   Also C/O nausea.  Patient was seen in ED Sunday and treated for a UTI with Keflex. 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

## 2015-07-06 NOTE — H&P (Signed)
[facility] History and Physical                                                                                    Lindsay Sheppard, is a 81 y.o. female  MRN: [id number]   DOB - May 27, 1921  Admit Date - 07/06/2015  Outpatient Primary MD for the patient is [name], MD  Referring Physician:  Dr. Jerline Pain, resident  Chief Complaint:   Chief Complaint  Patient presents with  . Altered Mental Status     HPI  Lindsay Sheppard  is a 81 y.o. 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 On Sunday, 10/30 the patient was seen in the emergency department for UTI. She was prescribed Keflex and discharged home. She returns today lethargic and confused unable to give her an history. History was collected from her daughter at bedside. Per her daughter for the last 48-72 hours the patient has had nausea and dry heaving, she has complained of right lower quadrant abdominal pain. Normally the patient gets up with the assistance of her daughters in the morning but this morning she was too lethargic to do so. Her lethargy and confusion prompted the daughters to bring her back to the hospital. The patient has a chronic Foley catheter that was changed last on October 7 (prior to the discovery of her urinary tract infection). Reportedly the patient's bowel movements have been normal. She has had a lot of dry heaving but no emesis. She has been complaining congestion in the back of her throat.  In the emergency department her blood pressure is 225/87, labs are otherwise unrevealing, CT head is negative for acute stroke or bleed, chest x-ray shows left base opacity.  Patient is lethargic and hard of hearing and unable to give review of systems.  Past Medical History  Past Medical History  Diagnosis Date  . POSTHERPETIC  NEURALGIA 11/02/2009  . B12 DEFICIENCY 05/23/2007  . HYPERLIPIDEMIA 05/23/2007  . HYPERKALEMIA 05/30/2010  . ANEMIA-IRON DEFICIENCY 01/26/2007  . BLEPHARITIS, BILATERAL 02/23/2008  . Other specified forms of hearing loss 05/30/2010  . HYPERTENSION 01/26/2007  . Atrial fibrillation ([place]) 05/23/2007  . DIASTOLIC HEART FAILURE, CHRONIC 12/14/2009  . POSTURAL HYPOTENSION 02/07/2008  . ALLERGIC RHINITIS 11/02/2009  . GERD 01/26/2007  . CONSTIPATION 01/25/2008  . SKIN LESION 05/30/2010  . BACK PAIN 08/02/2007  . OSTEOPOROSIS 05/23/2007  . SYNCOPE 02/07/2008  . FATIGUE 09/06/2007  . Dysuria 09/05/2008  . Abdominal pain, epigastric 09/06/2007  . EPIGASTRIC TENDERNESS 10/12/2007  . PULMONARY EMBOLISM, HX OF 05/23/2007  . SHINGLES, HX OF 05/23/2007  . 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 Left foot pain   . Diabetic neuropathy ([place])   . RENAL INSUFFICIENCY 07/02/2009  . RENAL CYST, LEFT 09/06/2007  . Kidney stones     "never had OR"  . Family history of anesthesia complication     "daughter has PONV; another daughter a little anesthesia lasts way too long"  . CHF (congestive heart failure) ([facility]) 2011  . Myocardial infarction (  [place]) 2011    "mild"  . DVT (deep venous thrombosis) ([place]) ~ 1964    "think it was in the left"  . Aspiration pneumonia ([place]) ~ 2006    "due to aspiration"  . DIABETES MELLITUS, TYPE II 09/06/2007  . DEPRESSIVE DISORDER 01/25/2008    "@ times; never treated for it"  . Melanoma of nose ([place])   . Compression fracture of lumbar vertebra ([facility])     hx  . Compression fracture of thoracic vertebra ([place]) 02/07/2014    "fell this am" (02/07/2014  . Arthritis     "hands" (03/15/2014)    Past Surgical History  Procedure Laterality Date  . Appendectomy  1943  . Abdominal hysterectomy  1964  . Cardiac electrophysiology mapping and ablation  1999  . Cataract extraction w/ intraocular lens  implant, bilateral  2003-2005  . Carpal tunnel release Right 2004  . Mohs surgery  2011    "removed off her nose"   . Cardiac catheterization  10/2009  . Esophageal dilation  X 2      Social History Social History  Substance Use Topics  . Smoking status: Never Smoker   . Smokeless tobacco: Never Used  . Alcohol Use: No   lives at home and is cared for 24 x 7 by Her 2 daughters.  Primarily sedentary sitting in a recliner chair. Able to bear weight  Family History Family History  Problem Relation Age of Onset  . Breast cancer Mother   . Colon cancer Father   . Stroke Father     died with stroke postop  . Diabetes Sister     2 sisters  . Hypertension Other     Prior to Admission medications   Medication Sig Start Date End Date Taking? Authorizing Provider  acetaminophen (TYLENOL) 325 MG tablet Take 2 tablets (650 mg total) by mouth 3 (three) times daily. Patient taking differently: Take 650 mg by mouth at bedtime as needed for mild pain.  02/09/14  Yes Delfina Redwood, MD  cephALEXin (KEFLEX) 500 MG capsule Take 1 capsule (500 mg total) by mouth 4 (four) times daily. 07/01/15  Yes Daleen Bo, MD  cloNIDine (CATAPRES - DOSED IN MG/24 HR) 0.3 mg/24hr patch Place 0.3 mg onto the skin once a week.   Yes Historical Provider, MD  CRANBERRY PO Take 500 mg by mouth daily.    Yes Historical Provider, MD  cyanocobalamin (,VITAMIN B-12,) 1000 MCG/ML injection INJECT 1 ML INTO THE MUSCLE EVERY 30 DAYS 04/26/15  Yes Biagio Borg, MD  donepezil (ARICEPT) 5 MG tablet Take 1 tablet (5 mg total) by mouth at bedtime. Patient taking differently: Take 10 mg by mouth at bedtime.  01/03/15  Yes Biagio Borg, MD  ferrous sulfate 325 (65 FE) MG tablet Take 1 tablet (325 mg total) by mouth 2 (two) times daily with a meal. 02/15/14  Yes Ivan Anchors Love, PA-C  hydrALAZINE (APRESOLINE) 25 MG tablet Take 1 tablet (25 mg total) by mouth 3 (three) times daily. 12/13/14  Yes Biagio Borg, MD  insulin glargine (LANTUS) 100 UNIT/ML injection Inject 0.2 mLs (20 Units total) into the skin daily. 03/06/15  Yes Biagio Borg, MD   labetalol (NORMODYNE) 200 MG tablet Take 400 mg by mouth 2 (two) times daily.   Yes Historical Provider, MD  pantoprazole (PROTONIX) 40 MG tablet TAKE 1 BY MOUTH DAILY Patient taking differently: TAKE 1 tablet BY MOUTH DAILY 09/12/14  Yes Biagio Borg, MD  polyethylene  glycol (MIRALAX / GLYCOLAX) packet Take 17 g by mouth 2 (two) times daily. Patient taking differently: Take 17 g by mouth daily.  02/15/14  Yes Ivan Anchors Love, PA-C  pravastatin (PRAVACHOL) 40 MG tablet TAKE 1/2 BY MOUTH DAILY Patient taking differently: TAKE 1/2 tablet BY MOUTH DAILY 01/04/15  Yes Biagio Borg, MD  warfarin (COUMADIN) 2 MG tablet Take as directed by anticoagulation clinic Patient taking differently: Take 2-4 mg by mouth daily at 6 PM. Take 1 tablet all days Except Thursday take 2 tablets 01/18/15  Yes Biagio Borg, MD  ACCU-CHEK COMPACT PLUS test strip  12/19/14   Historical Provider, MD  amoxicillin-clavulanate (AUGMENTIN) 875-125 MG per tablet Take 1 tablet by mouth 2 (two) times daily. Patient not taking: Reported on 05/09/2015 04/19/15   Biagio Borg, MD  B-D INS SYRINGE 0.5CC/31GX5/16 31G X 5/16" 0.5 ML MISC  02/06/15   Historical Provider, MD  feeding supplement, GLUCERNA SHAKE, (GLUCERNA SHAKE) LIQD Take 237 mLs by mouth 2 (two) times daily between meals. Patient not taking: Reported on 07/06/2015 08/29/14   Velvet Bathe, MD  fluticasone [facility]) 50 MCG/ACT nasal spray Place 2 sprays into the nose daily. Patient not taking: Reported on 07/06/2015 03/22/13   Biagio Borg, MD  furosemide (LASIX) 20 MG tablet 1 tab by mouth per day as needed for swelling 12/05/14   Biagio Borg, MD  Hypromellose (ARTIFICIAL TEARS OP) Place 1 drop into both eyes daily as needed. For dry eyes    Historical Provider, MD    Allergies  Allergen Reactions  . Amiodarone Other (See Comments)    REACTION: f? fluid retention  . Deltasone [Prednisone] Other (See Comments)    unknown  . Diltiazem Hcl Other (See Comments)    unknown  .  Levofloxacin Nausea And Vomiting    REACTION: nausea  . Pregabalin Other (See Comments)    REACTION: hallucinations  . Spironolactone Other (See Comments)    REACTION: elevated K at lowest dose  . Tequin [Gatifloxacin] Other (See Comments)    Hypoglycemia  . Diazepam Anxiety and Other (See Comments)    REACTION: agitation    Physical Exam  Vitals  Blood pressure 225/87, pulse 82, resp. rate 20, SpO2 97 %.   General: Elderly female, hard of hearing lying in bed in NAD, edentulous, daughter at bedside  Psych:  Easily confused. Alert to person. Mentions that she is uncertain of where she is.  Neuro:   No F.N deficits, ALL C.Nerves Intact, Strength 5/5 all 4 extremities, Sensation intact all 4 extremities.  ENT:  Ears and Eyes appear Normal, Conjunctivae clear, PER. Moist oral mucosa without erythema or exudates.  Neck:  Supple, No lymphadenopathy appreciated  Respiratory:  Symmetrical chest wall movement, Good air movement bilaterally, CTAB.  Cardiac:  RRR, No Murmurs, no LE edema noted, no JVD.    Abdomen:  Positive bowel sounds, Soft, Non tender, Non distended,  No masses appreciated  Skin:  No Cyanosis, Normal Skin Turgor, No Skin Rash or Bruise.  Extremities:  Able to move all 4. 5/5 strength in each,  no effusions.  Data Review  Wt Readings from Last 3 Encounters:  04/10/15 58.514 kg (129 lb)  03/06/15 54.885 kg (121 lb)  12/05/14 57.335 kg (126 lb 6.4 oz)    CBC  Recent Labs Lab 07/01/15 1455 07/06/15 1220  WBC 6.0 4.8  HGB 11.8* 11.5*  HCT 37.9 35.6*  PLT 161 PLATELETS APPEAR ADEQUATE  MCV 89.0 87.5  MCH  27.7 28.3  MCHC 31.1 32.3  RDW 15.1 15.1  LYMPHSABS  --  0.6*  MONOABS  --  0.4  EOSABS  --  0.1  BASOSABS  --  0.0    Chemistries   Recent Labs Lab 07/01/15 1455 07/06/15 1220  NA 140 138  K 4.6 4.2  CL 108 109  CO2 23 21*  GLUCOSE 162* 126*  BUN 30* 23*  CREATININE 1.14* 0.98  CALCIUM 9.5 9.1  AST  --  22  ALT  --  20  ALKPHOS   --  55  BILITOT  --  0.6      Lab Results  Component Value Date   HGBA1C 6.0 03/06/2015      Coagulation profile  Recent Labs Lab 07/06/15 1220  INR 2.20*    Urinalysis    Component Value Date/Time   COLORURINE YELLOW 07/06/2015 1300   APPEARANCEUR CLEAR 07/06/2015 1300   LABSPEC 1.008 07/06/2015 1300   PHURINE 7.0 07/06/2015 1300   GLUCOSEU NEGATIVE 07/06/2015 1300   GLUCOSEU NEGATIVE 03/06/2015 1028   HGBUR SMALL* 07/06/2015 1300   HGBUR trace-lysed 06/21/2008 1003   BILIRUBINUR NEGATIVE 07/06/2015 1300   KETONESUR NEGATIVE 07/06/2015 1300   PROTEINUR 30* 07/06/2015 1300   UROBILINOGEN 0.2 07/06/2015 1300   NITRITE POSITIVE* 07/06/2015 1300   LEUKOCYTESUR SMALL* 07/06/2015 1300    Imaging results:   Dg Chest 2 View  07/06/2015  CLINICAL DATA:  Altered mental status EXAM: CHEST  2 VIEW COMPARISON:  September 04, 2014 FINDINGS: There is patchy airspace consolidation in the left base with minimal left effusion. Lungs elsewhere clear. Heart size and pulmonary vascularity are normal. No adenopathy. There is atherosclerotic change in aorta. There is anterior wedging of several thoracic vertebral bodies. IMPRESSION: Left base airspace consolidation. Followup PA and lateral chest radiographs recommended in 3-4 weeks following trial of antibiotic therapy to ensure resolution and exclude underlying malignancy. Electronically Signed   By: Lowella Grip III M.D.   On: 07/06/2015 12:57   Ct Head Wo Contrast  07/06/2015  CLINICAL DATA:  Increased fatigue, lethargy and confusion EXAM: CT HEAD WITHOUT CONTRAST TECHNIQUE: Contiguous axial images were obtained from the base of the skull through the vertex without contrast. COMPARISON:  08/27/2014 . FINDINGS: stable diffuse brain atrophy pattern and chronic periventricular white matter microvascular ischemic change. No acute intracranial hemorrhage, mass lesion, definite infarction, midline shift, herniation, hydrocephalus, or  extra-axial fluid collection. No focal mass effect or edema. Mild cerebellar atrophy as well. Orbits are symmetric. Sinuses and mastoids remain clear. Skull appears intact IMPRESSION: Stable atrophy pattern and minor periventricular white matter microvascular ischemic change. No interval change or acute process by noncontrast CT. Electronically Signed   By: Jerilynn Mages.  Shick M.D.   On: 07/06/2015 12:56    My personal review of EKG: Pending   Assessment & Plan  Principal Problem:   Hypertensive emergency Active Problems:   Hearing loss   UTI (lower urinary tract infection)   CAP (community acquired pneumonia)   RLQ abdominal pain   Lethargy   Foley catheter in place on admission   Hypertensive emergency BP 225/87 in the ER with confusion and lethargy Will give when necessary hydralazine and resume the patient's home blood pressure medications including: Labetalol, Clonidine, Lasix.  Community acquired pneumonia versus aspiration pneumonia Left base opacity seen on chest x-ray. Patient with cough and congestion. She has a history of aspiration. Started on azithromycin and Rocephin in the emergency department will continue these. Aspiration precautions, raise  head of bed, speech therapy consultation requested to rule out aspiration.  Confusion and lethargy Could be multifactorial including excessive hypertension, pneumonia, UTI, progressive dementia CT head is negative. Neuro exam is nonfocal. We will treat underlying problems.  Right lower quadrant pain Uncertain etiology. Patient has a 5.8x4.6 cm mass in her right colon found on CT scan in 09/2014. Family has opted not to work this up.  We will check two-view abdominal x-ray for possible constipation or other sources of pain.  Urinary tract infection Initially diagnosed on 10/30 in the ER and treated with Keflex. UA still shows positive nitrites. Will reculture. Antibiotics started for community acquired pneumonia we will treat urinary  tract infection as well. Change Foley catheter. This catheter was last changed on 10/7.  Diabetes mellitus with neuropathy Controlled. We'll continue basal/bolus regimen while inpatient.  History of DVT and PE Continue Coumadin per pharmacy. INR is therapeutic.    Consultants Called:    None  Family Communication:     Daughter at bedside  Code Status:    DO NOT RESUSCITATE  Condition:    Guarded  Potential Disposition:   Home versus SNF in approximately 3-4 days  Time spent in minutes : [place],  [place] on 07/06/2015 at 2:36 PM Between 7am to 7pm - Pager - [phone] After 7pm go to [email or web address] - password [email or web address] And look for the night coverage person covering me after hours

## 2015-07-06 NOTE — ED Notes (Signed)
Patient states that she is feeling bad and does  Not know why she was brought to the ED.  Family states that the patient has been having leg cramps, nausea and orthopnea.

## 2015-07-06 NOTE — Progress Notes (Signed)
ANTICOAGULATION CONSULT NOTE - Initial Consult  Pharmacy Consult for warfarin Indication: History of DVT/PE  Allergies  Allergen Reactions  . Amiodarone Other (See Comments)    REACTION: f? fluid retention  . Deltasone [Prednisone] Other (See Comments)    unknown  . Diltiazem Hcl Other (See Comments)    unknown  . Levofloxacin Nausea And Vomiting    REACTION: nausea  . Pregabalin Other (See Comments)    REACTION: hallucinations  . Spironolactone Other (See Comments)    REACTION: elevated K at lowest dose  . Tequin [Gatifloxacin] Other (See Comments)    Hypoglycemia  . Diazepam Anxiety and Other (See Comments)    REACTION: agitation    Vital Signs: Temp: 97.9 F (36.6 C) (11/04 1446) Temp Source: Oral (11/04 1446) BP: 193/73 mmHg (11/04 1506) Pulse Rate: 80 (11/04 1446)  Labs:  Recent Labs  07/06/15 1220  HGB 11.5*  HCT 35.6*  PLT PLATELETS APPEAR ADEQUATE  LABPROT 24.2*  INR 2.20*  CREATININE 0.98    CrCl cannot be calculated (Unknown ideal weight.).   Medical History: Past Medical History  Diagnosis Date  . POSTHERPETIC NEURALGIA 11/02/2009  . B12 DEFICIENCY 05/23/2007  . HYPERLIPIDEMIA 05/23/2007  . HYPERKALEMIA 05/30/2010  . ANEMIA-IRON DEFICIENCY 01/26/2007  . BLEPHARITIS, BILATERAL 02/23/2008  . Other specified forms of hearing loss 05/30/2010  . HYPERTENSION 01/26/2007  . Atrial fibrillation ([employer]) 05/23/2007  . DIASTOLIC HEART FAILURE, CHRONIC 12/14/2009  . POSTURAL HYPOTENSION 02/07/2008  . ALLERGIC RHINITIS 11/02/2009  . GERD 01/26/2007  . CONSTIPATION 01/25/2008  . SKIN LESION 05/30/2010  . BACK PAIN 08/02/2007  . OSTEOPOROSIS 05/23/2007  . SYNCOPE 02/07/2008  . FATIGUE 09/06/2007  . Dysuria 09/05/2008  . Abdominal pain, epigastric 09/06/2007  . EPIGASTRIC TENDERNESS 10/12/2007  . PULMONARY EMBOLISM, HX OF 05/23/2007  . SHINGLES, HX OF 05/23/2007  . 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 Left foot pain   . Diabetic neuropathy ([place])   . RENAL INSUFFICIENCY  07/02/2009  . RENAL CYST, LEFT 09/06/2007  . Kidney stones     "never had OR"  . Family history of anesthesia complication     "daughter has PONV; another daughter a little anesthesia lasts way too long"  . CHF (congestive heart failure) ([place]) 2011  . Myocardial infarction [facility]) 2011    "mild"  . DVT (deep venous thrombosis) ([place]) ~ 1964    "think it was in the left"  . Aspiration pneumonia ([place]) ~ 2006    "due to aspiration"  . DIABETES MELLITUS, TYPE II 09/06/2007  . DEPRESSIVE DISORDER 01/25/2008    "@ times; never treated for it"  . Melanoma of nose ([place])   . Compression fracture of lumbar vertebra ([facility])     hx  . Compression fracture of thoracic vertebra ([place]) 02/07/2014    "fell this am" (02/07/2014  . Arthritis     "hands" (03/15/2014)    Assessment: 81 yo f presenting to the ED on 11/4 for confusion and lethargy. Patient is on chronic coumadin at home for history of a PE and DVT. PTA warfarin dose is 2 mg every day except 4 mg on Thursdays. Pharmacy is consulted to restart warfarin here. INR on presentation is therapeutic at 2.20. Will restart her home dose. CBC ok.   Goal of Therapy:  INR 2-3 Monitor platelets by anticoagulation protocol: Yes   Plan:  Restart home warfarin dose of 2 mg every day except 4 mg on Thursdays Daily INR, CBC  Cassie L. Nicole Kindred, PharmD Clinical Pharmacy Resident  Pager: [phone] 07/06/2015 3:28 PM

## 2015-07-07 DIAGNOSIS — R5383 Other fatigue: Secondary | ICD-10-CM

## 2015-07-07 DIAGNOSIS — R1031 Right lower quadrant pain: Secondary | ICD-10-CM

## 2015-07-07 DIAGNOSIS — J189 Pneumonia, unspecified organism: Principal | ICD-10-CM

## 2015-07-07 DIAGNOSIS — I10 Essential (primary) hypertension: Secondary | ICD-10-CM

## 2015-07-07 DIAGNOSIS — N390 Urinary tract infection, site not specified: Secondary | ICD-10-CM

## 2015-07-07 LAB — STREP PNEUMONIAE URINARY ANTIGEN: Strep Pneumo Urinary Antigen: NEGATIVE

## 2015-07-07 LAB — GLUCOSE, CAPILLARY
GLUCOSE-CAPILLARY: 76 mg/dL (ref 65–99)
GLUCOSE-CAPILLARY: 114 mg/dL — AB (ref 65–99)
GLUCOSE-CAPILLARY: 190 mg/dL — AB (ref 65–99)

## 2015-07-07 LAB — PROTIME-INR
INR: 2.57 — ABNORMAL HIGH (ref 0.00–1.49)
PROTHROMBIN TIME: 27.3 s — AB (ref 11.6–15.2)

## 2015-07-07 LAB — BASIC METABOLIC PANEL
Anion gap: 9 (ref 5–15)
BUN: 22 mg/dL — AB (ref 6–20)
CALCIUM: 9.3 mg/dL (ref 8.9–10.3)
CO2: 25 mmol/L (ref 22–32)
CREATININE: 1.24 mg/dL — AB (ref 0.44–1.00)
Chloride: 105 mmol/L (ref 101–111)
GFR calc Af Amer: 42 mL/min — ABNORMAL LOW (ref >60)
GFR calc non Af Amer: 37 mL/min — ABNORMAL LOW (ref >60)
GLUCOSE: 73 mg/dL (ref 65–99)
Potassium: 4.2 mmol/L (ref 3.5–5.1)
Sodium: 139 mmol/L (ref 135–145)

## 2015-07-07 LAB — CBC
HCT: 37 % (ref 36.0–46.0)
Hemoglobin: 11.8 g/dL — ABNORMAL LOW (ref 12.0–15.0)
MCH: 28.2 pg (ref 26.0–34.0)
MCHC: 31.9 g/dL (ref 30.0–36.0)
MCV: 88.3 fL (ref 78.0–100.0)
PLATELETS: 191 10*3/uL (ref 150–400)
RBC: 4.19 MIL/uL (ref 3.87–5.11)
RDW: 15.4 % (ref 11.5–15.5)
WBC: 6.7 10*3/uL (ref 4.0–10.5)

## 2015-07-07 MED ORDER — MORPHINE SULFATE (PF) 2 MG/ML IV SOLN
1.0000 mg | Freq: Once | INTRAVENOUS | Status: AC
  Administered 2015-07-07: 1 mg via INTRAVENOUS
  Filled 2015-07-07: qty 1

## 2015-07-07 MED ORDER — BISACODYL 10 MG RE SUPP
10.0000 mg | Freq: Once | RECTAL | Status: AC
  Administered 2015-07-07: 10 mg via RECTAL
  Filled 2015-07-07: qty 1

## 2015-07-07 MED ORDER — HYDRALAZINE HCL 50 MG PO TABS
50.0000 mg | ORAL_TABLET | Freq: Three times a day (TID) | ORAL | Status: DC
Start: 2015-07-07 — End: 2015-07-08
  Administered 2015-07-07 (×2): 50 mg via ORAL
  Filled 2015-07-07 (×2): qty 1

## 2015-07-07 MED ORDER — TRAMADOL HCL 50 MG PO TABS
50.0000 mg | ORAL_TABLET | Freq: Once | ORAL | Status: DC
  Filled 2015-07-07: qty 1

## 2015-07-07 MED ORDER — SODIUM CHLORIDE 0.9 % IV SOLN
INTRAVENOUS | Status: AC
  Administered 2015-07-07 – 2015-07-08 (×2): via INTRAVENOUS

## 2015-07-07 MED ORDER — RESOURCE THICKENUP CLEAR PO POWD
ORAL | Status: DC | PRN
  Filled 2015-07-07: qty 125

## 2015-07-07 MED ORDER — MORPHINE SULFATE (PF) 2 MG/ML IV SOLN
0.5000 mg | Freq: Once | INTRAVENOUS | Status: AC
  Administered 2015-07-07: 0.5 mg via INTRAVENOUS
  Filled 2015-07-07: qty 1

## 2015-07-07 MED ORDER — POLYETHYLENE GLYCOL 3350 17 G PO PACK
17.0000 g | PACK | Freq: Every day | ORAL | Status: DC
  Administered 2015-07-07 – 2015-07-10 (×4): 17 g via ORAL
  Filled 2015-07-07 (×5): qty 1

## 2015-07-07 NOTE — Progress Notes (Signed)
ANTICOAGULATION CONSULT NOTE - Initial Consult  Pharmacy Consult for warfarin Indication: History of DVT/PE  Allergies  Allergen Reactions  . Amiodarone Other (See Comments)    REACTION: f? fluid retention  . Deltasone [Prednisone] Other (See Comments)    unknown  . Diltiazem Hcl Other (See Comments)    unknown  . Levofloxacin Nausea And Vomiting    REACTION: nausea  . Pregabalin Other (See Comments)    REACTION: hallucinations  . Spironolactone Other (See Comments)    REACTION: elevated K at lowest dose  . Tequin [Gatifloxacin] Other (See Comments)    Hypoglycemia  . Diazepam Anxiety and Other (See Comments)    REACTION: agitation    Vital Signs: Temp: 97.9 F (36.6 C) (11/05 0957) Temp Source: Oral (11/05 0957) BP: 117/46 mmHg (11/05 1200) Pulse Rate: 78 (11/05 1200)  Labs:  Recent Labs  07/06/15 1220 07/07/15 0539  HGB 11.5* 11.8*  HCT 35.6* 37.0  PLT PLATELETS APPEAR ADEQUATE 191  LABPROT 24.2* 27.3*  INR 2.20* 2.57*  CREATININE 0.98 1.24*    Estimated Creatinine Clearance: 25.7 mL/min (by C-G formula based on Cr of 1.24).   Medical History: Past Medical History  Diagnosis Date  . POSTHERPETIC NEURALGIA 11/02/2009  . B12 DEFICIENCY 05/23/2007  . HYPERLIPIDEMIA 05/23/2007  . HYPERKALEMIA 05/30/2010  . ANEMIA-IRON DEFICIENCY 01/26/2007  . BLEPHARITIS, BILATERAL 02/23/2008  . Other specified forms of hearing loss 05/30/2010  . HYPERTENSION 01/26/2007  . Atrial fibrillation ([place]) 05/23/2007  . DIASTOLIC HEART FAILURE, CHRONIC 12/14/2009  . POSTURAL HYPOTENSION 02/07/2008  . ALLERGIC RHINITIS 11/02/2009  . GERD 01/26/2007  . CONSTIPATION 01/25/2008  . SKIN LESION 05/30/2010  . BACK PAIN 08/02/2007  . OSTEOPOROSIS 05/23/2007  . SYNCOPE 02/07/2008  . FATIGUE 09/06/2007  . Dysuria 09/05/2008  . Abdominal pain, epigastric 09/06/2007  . EPIGASTRIC TENDERNESS 10/12/2007  . PULMONARY EMBOLISM, HX OF 05/23/2007  . SHINGLES, HX OF 05/23/2007  . 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  Left foot pain   . Diabetic neuropathy ([place])   . RENAL INSUFFICIENCY 07/02/2009  . RENAL CYST, LEFT 09/06/2007  . Kidney stones     "never had OR"  . Family history of anesthesia complication     "daughter has PONV; another daughter a little anesthesia lasts way too long"  . CHF (congestive heart failure) ([place]) 2011  . Myocardial infarction [facility]) 2011    "mild"  . DVT (deep venous thrombosis) ([place]) ~ 1964    "think it was in the left"  . Aspiration pneumonia ([place]) ~ 2006    "due to aspiration"  . DIABETES MELLITUS, TYPE II 09/06/2007  . DEPRESSIVE DISORDER 01/25/2008    "@ times; never treated for it"  . Melanoma of nose ([place])   . Compression fracture of lumbar vertebra ([facility])     hx  . Compression fracture of thoracic vertebra ([place]) 02/07/2014    "fell this am" (02/07/2014  . Arthritis     "hands" (03/15/2014)  . Foley catheter in place on admission     Assessment: 81 yo f presenting to the ED on 11/4 for confusion and lethargy. Patient is on chronic coumadin at home for history of a PE and DVT. On coumadin 2mg  daily exc 4mg  on Thurs. Will continue PTA coumadin dose as patient came in with therapeutic INR. Patient did not receive yesterday's dose due to nausea. INR remains therapeutic at 2.57. Hgb low at 11.8, plts wnl. No s/s of bleed  Goal of Therapy:  INR 2-3 Monitor platelets by  anticoagulation protocol: Yes   Plan:  Continue PTA Coumadin 2mg  daily exc 4mg  on Thurs Monitor daily INR, CBC, s/s of bleed  Elenor Quinones, PharmD Clinical Pharmacist Pager [phone] 07/07/2015 2:05 PM

## 2015-07-07 NOTE — Clinical Social Work Note (Signed)
Clinical Social Work Assessment  Patient Details  Name: Lindsay Sheppard MRN: [id number] Date of Birth: [birth year]  Date of referral:  07/07/15               Reason for consult:  Facility Placement, Discharge Planning                Permission sought to share information with:  Family Supports, Customer service manager, Case Optician, dispensing granted to share information::  Yes, Verbal Permission Granted  Name::      Luanna Salk )  Agency::   (SNF's )  Relationship::   (Daughter )  Contact Information:   [phone])  Housing/Transportation Living arrangements for the past 2 months:  Single Family Home Source of Information:  Adult Children Patient Interpreter Needed:  None Criminal Activity/Legal Involvement Pertinent to Current Situation/Hospitalization:  No - Comment as needed Significant Relationships:  Adult Children, Other Family Members Lives with:  Adult Children Do you feel safe going back to the place where you live?  No Need for family participation in patient care:  No (Coment)  Care giving concerns:  Patient requiring short-term rehab placement at skilled level.    Social Worker assessment / plan:  Holiday representative spoke with patient's daughter, [name] in regards to post-acute placement for SNF. CSW introduced CSW role and SNF process. Patient's dtr reported family has been providing 24 hour care at home however believes that patient can benefit from SNF before returning home. Pt's dtr does not believe she is able to manage patient at home at discharge. Pt's dtr and family are able to short-term placement before pt returns home with family care. No further concerns reported at this time. CSW remains available as needed.   Employment status:  Retired Forensic scientist:  Medicare PT Recommendations:  [place] / Referral to community resources:  [place]  Patient/Family's Response to care:  Pt disoriented.  Pt's dtr and family agreeable to short-term placement only and wishes for pt to return home right away. Pt's dtr and family strongly involved in pt's care. Pt's family supportive, pleasant and appreciated social work intervention.   Patient/Family's Understanding of and Emotional Response to Diagnosis, Current Treatment, and Prognosis:  Pt's family involved/knowledgeable of medical intervention and continued treatment.   Emotional Assessment Appearance:  Appears stated age Attitude/Demeanor/Rapport:  Unable to Assess Affect (typically observed):  Unable to Assess Orientation:  Oriented to Self Alcohol / Substance use:  Not Applicable Psych involvement (Current and /or in the community):  No (Comment)  Discharge Needs  Concerns to be addressed:  Care Coordination Readmission within the last 30 days:  No Current discharge risk:  Dependent with Mobility Barriers to Discharge:  Continued Medical Work up   [employer], MSW, LCSWA [phone] 07/07/2015 5:01 PM

## 2015-07-07 NOTE — Progress Notes (Signed)
Pt c/o abd pain,leg pain and back pain,Karen Kirby-Graham NP notified see physican orders ,will cont to monitor

## 2015-07-07 NOTE — Progress Notes (Signed)
Family concerned about pain for patient- MD paged

## 2015-07-07 NOTE — Clinical Social Work Placement (Signed)
   CLINICAL SOCIAL WORK PLACEMENT  NOTE  Date:  07/07/2015  Patient Details  Name: Lindsay Sheppard MRN: [id number] Date of Birth: 23-May-1921  Clinical Social Work is seeking post-discharge placement for this patient at the [place] level of care (*CSW will initial, date and re-position this form in  chart as items are completed):  Yes   Patient/family provided with [place] Work Department's list of facilities offering this level of care within the geographic area requested by the patient (or if unable, by the patient's family).  Yes   Patient/family informed of their freedom to choose among providers that offer the needed level of care, that participate in Medicare, Medicaid or managed care program needed by the patient, have an available bed and are willing to accept the patient.  Yes   Patient/family informed of [facility]'s ownership interest in [facility] and [facility], as well as of the fact that they are under no obligation to receive care at these facilities.  PASRR submitted to EDS on       PASRR number received on       Existing PASRR number confirmed on       FL2 transmitted to all facilities in geographic area requested by pt/family on       FL2 transmitted to all facilities within larger geographic area on       Patient informed that his/her managed care company has contracts with or will negotiate with certain facilities, including the following:            Patient/family informed of bed offers received.  Patient chooses bed at       Physician recommends and patient chooses bed at      Patient to be transferred to   on  .  Patient to be transferred to facility by       Patient family notified on   of transfer.  Name of family member notified:        PHYSICIAN Please sign FL2     Additional Comment:    _______________________________________________ Glendon Axe, MSW, [place] [phone] 07/07/2015 5:01  PM

## 2015-07-07 NOTE — Evaluation (Signed)
Physical Therapy Evaluation Patient Details Name: MORRIGAN WICKENS MRN: [id number] DOB: 04-25-1921 Today's Date: 07/07/2015   History of Present Illness  Pt. admitted 07/06/15 with acute encephalopathy, nausea, dry heaves, abdominal pain.  Found to have hypertensive emergency, PNA (CAP vs aspiration).  Please see H&P for extensive past history  Clinical Impression  Pt. Presents to PT with limitations in bed mobility due to weakness deconditioning.  Full mobility assessment could not be completed as pt. Had just received a suppository.  Pt. Will benefit from further mobility testing for full picture, however I anticipate pt. Will need SNF level services post acute.  Daughter [name] states that she and her sister have been providing 24 hour in home care for their mom.  [name] states "it may be time for some additional help".  She also relays that their mom's mobility has recently been decreasing.      Follow Up Recommendations SNF;Supervision/Assistance - 24 hour    Equipment Recommendations  None recommended by PT    Recommendations for Other Services       Precautions / Restrictions Precautions Precautions: Fall Restrictions Weight Bearing Restrictions: No      Mobility  Bed Mobility Overal bed mobility: Needs Assistance Bed Mobility: Rolling Rolling: Min assist         General bed mobility comments: Pt. able to roll to either side in bed with min and min guard assist, verbal cueing.  Needed to use bed rails to assist self.  RN had just given pt. a supository so did not attempt further mobility assessment  Transfers                 General transfer comment: TBA  Ambulation/Gait                Stairs            Wheelchair Mobility    Modified Rankin (Stroke Patients Only)       Balance                                             Pertinent Vitals/Pain Pain Assessment: Faces Faces Pain Scale: No hurt    Home Living  Family/patient expects to be discharged to:: Private residence Living Arrangements: Children;Other (Comment) (2 daughters provide 24 hour  care in pt's home) Available Help at Discharge: Family;Available 24 hours/day Type of Home: House Home Access: Stairs to enter Entrance Stairs-Rails: Right;Left;Can reach both Entrance Stairs-Number of Steps: 2 Home Layout: Two level;Able to live on main level with bedroom/bathroom Home Equipment: Gilford Rile - 2 wheels;Wheelchair - [employer];Shower seat;Grab bars - tub/shower;Adaptive equipment      Prior Function Level of Independence: Needs assistance   Gait / Transfers Assistance Needed: Pt. needed assist to rise to stand then ambulated in the home with min guard assist           Hand Dominance   Dominant Hand: Right    Extremity/Trunk Assessment   Upper Extremity Assessment: Generalized weakness           Lower Extremity Assessment: Generalized weakness;RLE deficits/detail;LLE deficits/detail RLE Deficits / Details: able to lift leg off bed against gravity; tight hamstrings L>R; passive dorsiflexion limitations (~-20) LLE Deficits / Details: able to lift LE off bed against gravity; hamstring tightness L>R; passive dorsiflexion limitation to ~ - 20     Communication   Communication: [facility]  Cognition Arousal/Alertness: Awake/alert Behavior During Therapy: WFL for tasks assessed/performed Overall Cognitive Status: Impaired/Different from baseline Area of Impairment: Memory;Orientation;Awareness;Problem solving;Safety/judgement;Attention;Following commands Orientation Level: Place;Time;Situation Current Attention Level: Focused Memory: Decreased short-term memory Following Commands: Follows one step commands consistently Safety/Judgement: Decreased awareness of deficits Awareness: Intellectual Problem Solving: Slow processing;Decreased initiation;Difficulty sequencing;Requires tactile cues;Requires verbal cues       General Comments      Exercises        Assessment/Plan    PT Assessment Patient needs continued PT services  PT Diagnosis Generalized weakness;Other (comment);Altered mental status (likley will demonstrate difficulty in walking)   PT Problem List Decreased strength;Decreased range of motion;Decreased mobility;Decreased safety awareness;Other (comment) (further problems may be identified on further testing)  PT Treatment Interventions DME instruction;Gait training;Functional mobility training;Therapeutic activities;Therapeutic exercise;Balance training;Patient/family education;Other (comment) (preseumed interventions based on further testing)   PT Goals (Current goals can be found in the Care Plan section) Acute Rehab PT Goals Patient Stated Goal: daughter stated home vs SNF depending on pt's progress PT Goal Formulation: With patient/family Time For Goal Achievement: 07/21/15 Potential to Achieve Goals: Fair    Frequency Min 3X/week   Barriers to discharge Other (comment) Lengthy conversation with daughter [name].  She and her sister have been providing 24 hour care for their mom in pt's home for the last year and a half.   [name] states they need to consider if they can continue to do so. [name] states they are open to SNF for rehab but want to see how pt. does first.      Co-evaluation               End of Session   Activity Tolerance: Patient tolerated treatment well;Other (comment) (further assessment limited due to recent suppository given) Patient left: in bed;with call bell/phone within reach;with bed alarm set;with family/visitor present (daughter [name] present) Nurse Communication: Mobility status         Time: 8472-0721 PT Time Calculation (min) (ACUTE ONLY): 27 min   Charges:   PT Evaluation $Initial PT Evaluation Tier I: 1 Procedure PT Treatments $Therapeutic Activity: 8-22 mins   PT G CodesLadona Ridgel 07/07/2015, 12:06  PM Gerlean Ren PT Acute Rehab Services [place] [phone]

## 2015-07-07 NOTE — Evaluation (Signed)
Clinical/Bedside Swallow Evaluation Patient Details  Name: Lindsay Sheppard MRN: [id number] Date of Birth: 1921-08-16  Today's Date: 07/07/2015 Time: SLP Start Time (ACUTE ONLY): 0825 SLP Stop Time (ACUTE ONLY): 0900 SLP Time Calculation (min) (ACUTE ONLY): 35 min  Past Medical History:  Past Medical History  Diagnosis Date  . POSTHERPETIC NEURALGIA 11/02/2009  . B12 DEFICIENCY 05/23/2007  . HYPERLIPIDEMIA 05/23/2007  . HYPERKALEMIA 05/30/2010  . ANEMIA-IRON DEFICIENCY 01/26/2007  . BLEPHARITIS, BILATERAL 02/23/2008  . Other specified forms of hearing loss 05/30/2010  . HYPERTENSION 01/26/2007  . Atrial fibrillation ([place]) 05/23/2007  . DIASTOLIC HEART FAILURE, CHRONIC 12/14/2009  . POSTURAL HYPOTENSION 02/07/2008  . ALLERGIC RHINITIS 11/02/2009  . GERD 01/26/2007  . CONSTIPATION 01/25/2008  . SKIN LESION 05/30/2010  . BACK PAIN 08/02/2007  . OSTEOPOROSIS 05/23/2007  . SYNCOPE 02/07/2008  . FATIGUE 09/06/2007  . Dysuria 09/05/2008  . Abdominal pain, epigastric 09/06/2007  . EPIGASTRIC TENDERNESS 10/12/2007  . PULMONARY EMBOLISM, HX OF 05/23/2007  . SHINGLES, HX OF 05/23/2007  . 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 Left foot pain   . Diabetic neuropathy ([place])   . RENAL INSUFFICIENCY 07/02/2009  . RENAL CYST, LEFT 09/06/2007  . Kidney stones     "never had OR"  . Family history of anesthesia complication     "daughter has PONV; another daughter a little anesthesia lasts way too long"  . CHF (congestive heart failure) ([place]) 2011  . Myocardial infarction [facility]) 2011    "mild"  . DVT (deep venous thrombosis) ([place]) ~ 1964    "think it was in the left"  . Aspiration pneumonia ([place]) ~ 2006    "due to aspiration"  . DIABETES MELLITUS, TYPE II 09/06/2007  . DEPRESSIVE DISORDER 01/25/2008    "@ times; never treated for it"  . Melanoma of nose ([place])   . Compression fracture of lumbar vertebra ([facility])     hx  . Compression fracture of thoracic vertebra ([place]) 02/07/2014    "fell this am" (02/07/2014  . Arthritis      "hands" (03/15/2014)  . Foley catheter in place on admission    Past Surgical History:  Past Surgical History  Procedure Laterality Date  . Appendectomy  1943  . Abdominal hysterectomy  1964  . Cardiac electrophysiology mapping and ablation  1999  . Cataract extraction w/ intraocular lens  implant, bilateral  2003-2005  . Carpal tunnel release Right 2004  . Mohs surgery  2011    "removed off her nose"  . Cardiac catheterization  10/2009  . Esophageal dilation  X 2   HPI:  Pt is a 81 year old female with complex PMH including dementia, recently diagnosed with UTI and treated with keflex 5 days ago, who presents with lethargy and increased confusion per the patient's family. CT of head negative for acute intracranial abnormalities. CT of abdomen significant for patchy left lung base opacity.    Assessment / Plan / Recommendation Clinical Impression  Pt very weak and lethargic, with frequent reports of severe abdominal pain. Nursing aware of pts reported pain. Pt presenting with a moderate to severe oropharyngeal dysphagia likely impacted by reduced mentation and acute onset of generalized weakness. Oral motor exam revealed decreased strength and range of motion of lingual and labial musculature. Pt with very poor tolerance of thin, nectar thick, and honey thick consistencies via teaspoon administration with immediate coughing evidenced concerning for inadequate airway protection. Suspect moderate delay in swallow initiation through palpation. Pt with improved control and coordination with  puree consistencies. Pt unable to tolerate placement of upper and lower dentures secondary to loose and poor fit. Anticipate poor tolerance of solid PO given severe weakness and lethargy.   Educated family extensively regarding high aspiration risk. Family reports pt having diet temporarily modified in the past with acute illnesses. Recommend dysphagia 1 (puree) and pudding thick liquids. Medicines crushed with  puree and full supervision with all meals. Anticipate as mentation and medical condition improves pt will be able to tolerate upgraded PO trials. ST to follow up and closely monitor.      Aspiration Risk  Moderate    Diet Recommendation Dysphagia 1 (Puree);Pudding   Medication Administration: Crushed with puree Compensations: Slow rate;Small sips/bites    Other  Recommendations Oral Care Recommendations: Oral care BID Other Recommendations: Order thickener from pharmacy   Follow Up Recommendations       Frequency and Duration min 2x/week  2 weeks   Pertinent Vitals/Pain Severe pain, reported to RN     SLP Swallow Goals     Swallow Study Prior Functional Status       General Date of Onset: 07/06/15 Other Pertinent Information: Pt is a 81 year old female with complex PMH including dementia, recently diagnosed with UTI and treated with keflex 5 days ago, who presents with lethargy and increased confusion per the patient's family. CT of head negative for acute intracranial abnormalities. CT of abdomen significant for patchy left lung base opacity.  Type of Study: Bedside swallow evaluation Diet Prior to this Study: Thin liquids;Dysphagia 3 (soft) Temperature Spikes Noted: No Respiratory Status: Room air History of Recent Intubation: No Behavior/Cognition: Alert Oral Cavity - Dentition: Edentulous (has upper and lower dentures, that do not fit properly ) Self-Feeding Abilities: Needs assist Patient Positioning: Upright in bed Baseline Vocal Quality: Low vocal intensity Volitional Cough: Weak Volitional Swallow: Unable to elicit    Oral/Motor/Sensory Function Overall Oral Motor/Sensory Function: Impaired Labial ROM: Reduced right;Reduced left Labial Symmetry: Within Functional Limits Labial Strength: Reduced Lingual ROM: Reduced right;Reduced left Lingual Symmetry: Within Functional Limits Lingual Strength: Reduced Facial Symmetry: Within Functional Limits Facial  Strength: Reduced   Ice Chips Ice chips: Impaired Presentation: Spoon Oral Phase Impairments: Impaired anterior to posterior transit;Reduced lingual movement/coordination Oral Phase Functional Implications: Prolonged oral transit Pharyngeal Phase Impairments: Suspected delayed Swallow;Cough - Immediate   Thin Liquid Thin Liquid: Impaired Presentation: Spoon Oral Phase Impairments: Reduced lingual movement/coordination;Impaired anterior to posterior transit Oral Phase Functional Implications: Prolonged oral transit Pharyngeal  Phase Impairments: Suspected delayed Swallow;Cough - Immediate    Nectar Thick Nectar Thick Liquid: Impaired Presentation: Spoon Oral Phase Impairments: Reduced lingual movement/coordination Oral phase functional implications: Prolonged oral transit Pharyngeal Phase Impairments: Suspected delayed Swallow;Cough - Immediate   Honey Thick Honey Thick Liquid: Impaired Presentation: Spoon Oral Phase Impairments: Reduced lingual movement/coordination Oral Phase Functional Implications: Prolonged oral transit Pharyngeal Phase Impairments: Suspected delayed Swallow;Cough - Immediate   Puree Puree: Impaired Oral Phase Impairments: Reduced lingual movement/coordination Oral Phase Functional Implications: Prolonged oral transit Pharyngeal Phase Impairments: Suspected delayed Swallow   Solid   GO Arvil Chaco MA, CCC-SLP Acute Care Speech Language Pathologist       Solid: Not tested       Arvil Chaco E 07/07/2015,9:21 AM

## 2015-07-07 NOTE — Progress Notes (Signed)
Initial Nutrition Assessment  DOCUMENTATION CODES:   Not applicable  INTERVENTION:   Magic cup TID with meals, each supplement provides 290 kcal and 9 grams of protein  NUTRITION DIAGNOSIS:   Predicted suboptimal nutrient intake related to poor appetite (abdominal pain) as evidenced by  (chart review)   GOAL:   Patient will meet greater than or equal to 90% of their needs  MONITOR:   PO intake, Supplement acceptance, Labs, Weight trends, I & O's  REASON FOR ASSESSMENT:   Malnutrition Screening Tool  ASSESSMENT:   81 y.o. Female with history of diabetes mellitus, hypertension, recent UTI, known history of DVT, PE on chronic Coumadin presented with acute encephalopathy  RD unable to obtain nutrition hx or complete Nutrition Focused Physical Exam.  Per chart review, pt with nausea and abdominal pain.  Suspect poor appetite as well.  No % PO intake records available.  S/p bedside swallow evaluation this AM.  Pt presented with moderate to severe dysphagia.  RD to add oral nutrition supplements to meal trays.  Diet Order:  DIET - DYS 1 Room service appropriate?: Yes; Fluid consistency:: Pudding Thick  Skin:  Reviewed, no issues  Last BM:  N/A  Height:   Ht Readings from Last 1 Encounters:  07/06/15 5\' 5"  (1.651 m)    Weight:   Wt Readings from Last 1 Encounters:  07/06/15 123 lb 14.4 oz (56.2 kg)    Ideal Body Weight:  56.8 kg  BMI:  Body mass index is 20.62 kg/(m^2).  Estimated Nutritional Needs:   Kcal:  1200-1400  Protein:  60-70 gm  Fluid:  >/= 1.5 L  EDUCATION NEEDS:   No education needs identified at this time  Arthur Holms, RD, LDN Pager #: [phone] After-Hours Pager #: [phone]

## 2015-07-07 NOTE — Progress Notes (Addendum)
[facility] PROGRESS NOTE  Lindsay Sheppard ACZ:[id number] DOB: 08-16-1921 DOA: 07/06/2015 PCP: [name], MD  Brief narrative 81 year old female with history of hypertension, type 2 diabetes mellitus, history of DVT and PE on chronic Coumadin, chronic Foley catheter, recently diagnosed UTI for which she presented to the ED and discharged on Keflex return to the ED after having nausea and writing with right lower quadrant abdominal pain. She was also lethargic and encephalopathic. In the ED UA was positive for UTI and chest x-ray showed left base opacity. CT head was unremarkable. She also had hypertensive urgency. Patient placed on empiric antibiotics. Foley catheter was change in the ED. Cultures sent and admitted to medical floor.  Assessment/Plan: Acute encephalopathy Possibly associated with UTI, dehydration and currently acquired pneumonia. On empiric Rocephin and azithromycin. Patient more arousable this morning. gentle hydration. Follow cultures. Maintain aspiration precaution. Speech and swallow following.  Right lower quadrant abdominal pain This was a presenting complaint as well. She has tenderness on palpation. X-ray done during the night showed stool-filled colon with some gaseous distention which might be contributing to her symptom. Patient also has a 5.84.6 cm mass in right: Which was found on CT scan in 09/2014. Family had opted not to work this up. I resumed her home dose MiraLAX and ordered Dulcolax supposed 80. If symptoms do not improve despite bowel movement obtain a CT scan of her abdomen and pelvis to rule out any underlying pathology. Daughter informs that patient does complain of similar less intense abdominal pain when she does not have regular bowel movements.  UTI and community-acquired pneumonia As above. Has a chronic Foley which was changed in the ED.  accelerated  Hypertension/hypertensive urgency Likely contributed by primary symptoms. Home dose of  labetalol, hydralazine  and clonidine has been resumed. added when necessary hydralazine as well.  Type 2 diabetes mellitus with neuropathy Controlled. Continue with Lantus and sliding scale insulin.  Acute kidney injury Likely secondary to dehydration. 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  Iron deficiency anemia Continue supplement  GERD Continue PPI  Protein calorie malnutrition Continue supplement  DVT prophylaxis: On Coumadin  Diet: Dysphagia level I. Speech and swallow will reevaluate once mental status better.  Code Status: DO NOT RESUSCITATE Family Communication: Daughter is at bedside Disposition Plan: home once improved possibly in next 48 hrs   Consultants:  None  Procedures:  Head CT  Antibiotics:  IV Rocephin and azithromycin  HPI/Subjective: Seen and examined. Patient complaining of right lower cord and abdominal pain not resolving low-dose morphine given overnight. No nausea or vomiting. Still lethargic.  Objective: Filed Vitals:   07/07/15 0957  BP: 186/79  Pulse: 109  Temp: 97.9 F (36.6 C)  Resp: 20    Intake/Output Summary (Last 24 hours) at 07/07/15 1130 Last data filed at 07/07/15 1093  Gross per 24 hour  Intake    243 ml  Output    675 ml  Net   -432 ml   Filed Weights   07/06/15 1656  Weight: 56.2 kg (123 lb 14.4 oz)    Exam:   General:  Elderly female lying in bed lethargic and moaning with pain  HEENT: No pallor, dry oral mucosa, supple  Chest: Clear to auscultation bilaterally  CVS: Normal S1 and S2, no murmurs  GI: Soft, nondistended, right lower quadrant tenderness to palpation, bowel sounds present, has chronic foley  Musculoskeletal: Warm, no edema  CNS: Sleepy but arousable and answering a few  questions.   Data Reviewed: Basic Metabolic Panel:  Recent Labs Lab 07/01/15 1455 07/06/15 1220 07/07/15 0539  NA 140 138 139  K 4.6 4.2 4.2  CL 108 109 105  CO2 23  21* 25  GLUCOSE 162* 126* 73  BUN 30* 23* 22*  CREATININE 1.14* 0.98 1.24*  CALCIUM 9.5 9.1 9.3   Liver Function Tests:  Recent Labs Lab 07/06/15 1220  AST 22  ALT 20  ALKPHOS 55  BILITOT 0.6  PROT 5.0*  ALBUMIN 3.1*   No results for input(s): LIPASE, AMYLASE in the last 168 hours. No results for input(s): AMMONIA in the last 168 hours. CBC:  Recent Labs Lab 07/01/15 1455 07/06/15 1220 07/07/15 0539  WBC 6.0 4.8 6.7  NEUTROABS  --  3.7  --   HGB 11.8* 11.5* 11.8*  HCT 37.9 35.6* 37.0  MCV 89.0 87.5 88.3  PLT 161 PLATELETS APPEAR ADEQUATE 191   Cardiac Enzymes: No results for input(s): CKTOTAL, CKMB, CKMBINDEX, TROPONINI in the last 168 hours. BNP (last 3 results) No results for input(s): BNP in the last 8760 hours.  ProBNP (last 3 results) No results for input(s): PROBNP in the last 8760 hours.  CBG:  Recent Labs Lab 07/01/15 1459 07/06/15 1653 07/06/15 2058 07/07/15 0726  GLUCAP 145* 151* 107* 76    Recent Results (from the past 240 hour(s))  Urine culture     Status: None   Collection Time: 07/01/15  2:50 PM  Result Value Ref Range Status   Specimen Description URINE, RANDOM  Final   Special Requests NONE  Final   Culture MULTIPLE SPECIES PRESENT, SUGGEST RECOLLECTION  Final   Report Status 07/02/2015 FINAL  Final  Blood culture (routine x 2)     Status: None (Preliminary result)   Collection Time: 07/06/15  2:00 PM  Result Value Ref Range Status   Specimen Description BLOOD LEFT ARM  Final   Special Requests BOTTLES DRAWN AEROBIC AND ANAEROBIC 5CC  Final   Culture NO GROWTH < 24 HOURS  Final   Report Status PENDING  Incomplete  Blood culture (routine x 2)     Status: None (Preliminary result)   Collection Time: 07/06/15  2:05 PM  Result Value Ref Range Status   Specimen Description BLOOD RIGHT HAND  Final   Special Requests BOTTLES DRAWN AEROBIC AND ANAEROBIC 5CC  Final   Culture NO GROWTH < 24 HOURS  Final   Report Status PENDING   Incomplete     Studies: Dg Chest 2 View  07/06/2015  CLINICAL DATA:  Altered mental status EXAM: CHEST  2 VIEW COMPARISON:  September 04, 2014 FINDINGS: There is patchy airspace consolidation in the left base with minimal left effusion. Lungs elsewhere clear. Heart size and pulmonary vascularity are normal. No adenopathy. There is atherosclerotic change in aorta. There is anterior wedging of several thoracic vertebral bodies. IMPRESSION: Left base airspace consolidation. Followup PA and lateral chest radiographs recommended in 3-4 weeks following trial of antibiotic therapy to ensure resolution and exclude underlying malignancy. Electronically Signed   By: Lowella Grip III M.D.   On: 07/06/2015 12:57   Ct Head Wo Contrast  07/06/2015  CLINICAL DATA:  Increased fatigue, lethargy and confusion EXAM: CT HEAD WITHOUT CONTRAST TECHNIQUE: Contiguous axial images were obtained from the base of the skull through the vertex without contrast. COMPARISON:  08/27/2014 . FINDINGS: stable diffuse brain atrophy pattern and chronic periventricular white matter microvascular ischemic change. No acute intracranial hemorrhage, mass lesion, definite infarction, midline  shift, herniation, hydrocephalus, or extra-axial fluid collection. No focal mass effect or edema. Mild cerebellar atrophy as well. Orbits are symmetric. Sinuses and mastoids remain clear. Skull appears intact IMPRESSION: Stable atrophy pattern and minor periventricular white matter microvascular ischemic change. No interval change or acute process by noncontrast CT. Electronically Signed   By: Jerilynn Mages.  Shick M.D.   On: 07/06/2015 12:56   Dg Abd Portable 2v  07/06/2015  CLINICAL DATA:  Right lower quadrant abdominal pain and vomiting today EXAM: PORTABLE ABDOMEN - 2 VIEW COMPARISON:  09/04/2014 CT abdomen/pelvis FINDINGS: No disproportionately dilated small bowel loops or fluid levels to suggest small bowel obstruction. Mild diffuse gaseous distention of the stool  filled colon and rectum. No evidence of pneumatosis or pneumoperitoneum. Mild patchy opacity at the left lung base. Marked degenerative changes throughout the visualized thoracolumbar spine and atherosclerotic calcifications throughout the abdomen and pelvis. IMPRESSION: Nonobstructive bowel gas pattern.  No free air. Patchy left lung base opacity, correlate with chest radiograph. Electronically Signed   By: Ilona Sorrel M.D.   On: 07/06/2015 20:44    Scheduled Meds: . azithromycin  500 mg Intravenous Q24H  . cefTRIAXone (ROCEPHIN)  IV  1 g Intravenous Q24H  . [START ON 07/13/2015] cloNIDine  0.3 mg Transdermal Weekly  . feeding supplement (ENSURE ENLIVE)  237 mL Oral BID BM  . hydrALAZINE  25 mg Oral TID  . insulin glargine  15 Units Subcutaneous Daily  . labetalol  400 mg Oral BID  . pantoprazole  40 mg Oral Daily  . polyethylene glycol  17 g Oral Daily  . pravastatin  20 mg Oral q1800  . sodium chloride  3 mL Intravenous Q12H  . warfarin  2 mg Oral Once per day on Sun Mon Tue Wed Fri Sat  . [START ON 07/12/2015] warfarin  4 mg Oral Q Thu-1800  . Warfarin - Pharmacist Dosing Inpatient   Does not apply q1800   Continuous Infusions: . sodium chloride 75 mL/hr at 07/07/15 1057      Time spent: [place], [place] [facility] Pager [phone]. If 7PM-7AM, please contact night-coverage at [email or web address], password [facility] 07/07/2015, 11:30 AM  LOS: 1 day

## 2015-07-08 DIAGNOSIS — G9340 Encephalopathy, unspecified: Secondary | ICD-10-CM

## 2015-07-08 LAB — CBC
HEMATOCRIT: 34.3 % — AB (ref 36.0–46.0)
HEMOGLOBIN: 10.7 g/dL — AB (ref 12.0–15.0)
MCH: 27.9 pg (ref 26.0–34.0)
MCHC: 31.2 g/dL (ref 30.0–36.0)
MCV: 89.3 fL (ref 78.0–100.0)
Platelets: 163 10*3/uL (ref 150–400)
RBC: 3.84 MIL/uL — AB (ref 3.87–5.11)
RDW: 15.5 % (ref 11.5–15.5)
WBC: 5.7 10*3/uL (ref 4.0–10.5)

## 2015-07-08 LAB — BASIC METABOLIC PANEL
ANION GAP: 8 (ref 5–15)
BUN: 29 mg/dL — ABNORMAL HIGH (ref 6–20)
CALCIUM: 9 mg/dL (ref 8.9–10.3)
CO2: 25 mmol/L (ref 22–32)
Chloride: 105 mmol/L (ref 101–111)
Creatinine, Ser: 1.35 mg/dL — ABNORMAL HIGH (ref 0.44–1.00)
GFR, EST AFRICAN AMERICAN: 38 mL/min — AB (ref >60)
GFR, EST NON AFRICAN AMERICAN: 33 mL/min — AB (ref >60)
GLUCOSE: 223 mg/dL — AB (ref 65–99)
POTASSIUM: 4.2 mmol/L (ref 3.5–5.1)
Sodium: 138 mmol/L (ref 135–145)

## 2015-07-08 LAB — URINE CULTURE

## 2015-07-08 LAB — PROTIME-INR
INR: 3.78 — AB (ref 0.00–1.49)
Prothrombin Time: 36.4 seconds — ABNORMAL HIGH (ref 11.6–15.2)

## 2015-07-08 MED ORDER — HYDRALAZINE HCL 25 MG PO TABS
25.0000 mg | ORAL_TABLET | Freq: Three times a day (TID) | ORAL | Status: DC
  Administered 2015-07-08 – 2015-07-09 (×6): 25 mg via ORAL
  Filled 2015-07-08 (×7): qty 1

## 2015-07-08 NOTE — Progress Notes (Signed)
Speech Language Pathology Treatment: Dysphagia  Patient Details Name: Lindsay Sheppard MRN: [id number] DOB: [birth year] Today's Date: 07/08/2015 Time: 0459-9774 SLP Time Calculation (min) (ACUTE ONLY): 20 min  Assessment / Plan / Recommendation Clinical Impression  Skilled observation of intake of Dysphagia 1/pudding-thick liquids via tsp/cup/straw with minimal verbal cues for safety with cueing focusing on slow intake/small bites; progression to intake with straw with pudding-thick without s/s of aspiration; pt with continued weakness/intermittent lethargy indicating an upgrade at this time not warranted; MBS in next 1-2 days to see if diet/liquids can be upgraded? Pt having intermittent bouts of nausea with thickened liquids; family stated she had been on honey-thickened liquids in 2006, but "hasn't needed them in last 10 yrs" with one incidence of PNA noted during this time period.   HPI Other Pertinent Information: Pt is a 81 year old female with complex PMH including dementia, recently diagnosed with UTI and treated with keflex 5 days ago, who presents with lethargy and increased confusion per the patient's family. CT of head negative for acute intracranial abnormalities. CT of abdomen significant for patchy left lung base opacity.    Pertinent Vitals Pain Assessment: Faces Faces Pain Scale: No hurt  SLP Plan  Continue with current plan of care    Recommendations Diet recommendations: Dysphagia 1 (puree);Pudding-thick liquid Liquids provided via: Cup;Straw Medication Administration: Crushed with puree Compensations: Slow rate;Small sips/bites Postural Changes and/or Swallow Maneuvers: Seated upright 90 degrees              Oral Care Recommendations: Oral care BID Follow up Recommendations: Skilled Nursing facility Plan: Continue with current plan of care         ADAMS,PAT, M.S., CCC-SLP 07/08/2015, 1:44 PM

## 2015-07-08 NOTE — Progress Notes (Signed)
Utilization Review Completed.Ziare Orrick T11/01/2015  

## 2015-07-08 NOTE — Progress Notes (Signed)
[facility] PROGRESS NOTE  Lindsay Sheppard YHC:[id number] DOB: 18-May-1921 DOA: 07/06/2015 PCP: [name], MD  Brief narrative 81 year old female with history of hypertension, type 2 diabetes mellitus, history of DVT and PE on chronic Coumadin, chronic Foley catheter, recently diagnosed UTI for which she presented to the ED and discharged on Keflex return to the ED after having nausea and writing with right lower quadrant abdominal pain. She was also lethargic and encephalopathic. In the ED UA was positive for UTI and chest x-ray showed left base opacity. CT head was unremarkable. She also had hypertensive urgency. Patient placed on empiric antibiotics. Foley catheter was change in the ED. Cultures sent and admitted to medical floor.  Assessment/Plan: Acute encephalopathy Possibly associated with UTI, dehydration and currently acquired pneumonia. On empiric Rocephin and azithromycin. Patient much oriented today. gentle hydration. Follow cultures. Maintain aspiration precaution. Speech and swallow following.  Right lower quadrant abdominal pain Secondary to constipation which resolved after bowel movement. Patient also has a 5.84.6 cm mass in right: Which was found on CT scan in 09/2014. Family had opted not to work this up.   UTI and community-acquired pneumonia As above. Has a chronic Foley which was changed in the ED.  accelerated  Hypertension/hypertensive urgency Likely contributed by primary symptoms. Home dose of labetalol, hydralazine  and clonidine has been resumed. Low diastolic blood pressure this morning. Continue to monitor.  Type 2 diabetes mellitus with neuropathy Controlled. Continue with Lantus and sliding scale insulin.  Acute kidney injury Likely secondary to dehydration. 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  Iron deficiency anemia Continue supplement  GERD Continue PPI  Protein calorie malnutrition Continue  supplement  DVT prophylaxis: On Coumadin  Diet: Dysphagia level I. Speech and swallow will reevaluate  Code Status: DO NOT RESUSCITATE Family Communication: Spoke with daughter Ivin Booty on the phone Disposition Plan: skilled nursing facility as early as 11/7   Consultants:  None  Procedures:  Head CT  Antibiotics:  IV Rocephin and azithromycin  HPI/Subjective: Seen and examined. Abdominal pain resolved after bowel movement. Is more awake and oriented.  Objective: Filed Vitals:   07/08/15 0420  BP: 137/47  Pulse: 82  Temp: 98 F (36.7 C)  Resp: 18    Intake/Output Summary (Last 24 hours) at 07/08/15 1058 Last data filed at 07/08/15 0544  Gross per 24 hour  Intake 1888.75 ml  Output    300 ml  Net 1588.75 ml   Filed Weights   07/06/15 1656 07/07/15 2038  Weight: 56.2 kg (123 lb 14.4 oz) 58.2 kg (128 lb 4.9 oz)    Exam:   General:  Elderly female lying in bed in distress awake and communicating  HEENT: Mucosa, supple neck  Chest: Clear to auscultation bilaterally  CVS: Normal S1 and S2, no murmurs  GI: Soft, nondistended, and lower quadrant tenderness result, bowel sounds present, has chronic foley  Musculoskeletal: Warm, no edema  CNS: Awake and alert, answering to questions     Data Reviewed: Basic Metabolic Panel:  Recent Labs Lab 07/01/15 1455 07/06/15 1220 07/07/15 0539 07/08/15 0244  NA 140 138 139 138  K 4.6 4.2 4.2 4.2  CL 108 109 105 105  CO2 23 21* 25 25  GLUCOSE 162* 126* 73 223*  BUN 30* 23* 22* 29*  CREATININE 1.14* 0.98 1.24* 1.35*  CALCIUM 9.5 9.1 9.3 9.0   Liver Function Tests:  Recent Labs Lab 07/06/15 1220  AST 22  ALT 20  ALKPHOS  55  BILITOT 0.6  PROT 5.0*  ALBUMIN 3.1*   No results for input(s): LIPASE, AMYLASE in the last 168 hours. No results for input(s): AMMONIA in the last 168 hours. CBC:  Recent Labs Lab 07/01/15 1455 07/06/15 1220 07/07/15 0539 07/08/15 0244  WBC 6.0 4.8 6.7 5.7   NEUTROABS  --  3.7  --   --   HGB 11.8* 11.5* 11.8* 10.7*  HCT 37.9 35.6* 37.0 34.3*  MCV 89.0 87.5 88.3 89.3  PLT 161 PLATELETS APPEAR ADEQUATE 191 163   Cardiac Enzymes: No results for input(s): CKTOTAL, CKMB, CKMBINDEX, TROPONINI in the last 168 hours. BNP (last 3 results) No results for input(s): BNP in the last 8760 hours.  ProBNP (last 3 results) No results for input(s): PROBNP in the last 8760 hours.  CBG:  Recent Labs Lab 07/06/15 1653 07/06/15 2058 07/07/15 0726 07/07/15 1206 07/07/15 1703  GLUCAP 151* 107* 76 114* 190*    Recent Results (from the past 240 hour(s))  Urine culture     Status: None   Collection Time: 07/01/15  2:50 PM  Result Value Ref Range Status   Specimen Description URINE, RANDOM  Final   Special Requests NONE  Final   Culture MULTIPLE SPECIES PRESENT, SUGGEST RECOLLECTION  Final   Report Status 07/02/2015 FINAL  Final  Culture, Urine     Status: None   Collection Time: 07/06/15  1:00 PM  Result Value Ref Range Status   Specimen Description URINE, RANDOM  Final   Special Requests NONE  Final   Culture MULTIPLE SPECIES PRESENT, SUGGEST RECOLLECTION  Final   Report Status 07/08/2015 FINAL  Final  Blood culture (routine x 2)     Status: None (Preliminary result)   Collection Time: 07/06/15  2:00 PM  Result Value Ref Range Status   Specimen Description BLOOD LEFT ARM  Final   Special Requests BOTTLES DRAWN AEROBIC AND ANAEROBIC 5CC  Final   Culture NO GROWTH 2 DAYS  Final   Report Status PENDING  Incomplete  Blood culture (routine x 2)     Status: None (Preliminary result)   Collection Time: 07/06/15  2:05 PM  Result Value Ref Range Status   Specimen Description BLOOD RIGHT HAND  Final   Special Requests BOTTLES DRAWN AEROBIC AND ANAEROBIC 5CC  Final   Culture NO GROWTH 2 DAYS  Final   Report Status PENDING  Incomplete     Studies: Dg Chest 2 View  07/06/2015  CLINICAL DATA:  Altered mental status EXAM: CHEST  2 VIEW COMPARISON:   September 04, 2014 FINDINGS: There is patchy airspace consolidation in the left base with minimal left effusion. Lungs elsewhere clear. Heart size and pulmonary vascularity are normal. No adenopathy. There is atherosclerotic change in aorta. There is anterior wedging of several thoracic vertebral bodies. IMPRESSION: Left base airspace consolidation. Followup PA and lateral chest radiographs recommended in 3-4 weeks following trial of antibiotic therapy to ensure resolution and exclude underlying malignancy. Electronically Signed   By: Lowella Grip III M.D.   On: 07/06/2015 12:57   Ct Head Wo Contrast  07/06/2015  CLINICAL DATA:  Increased fatigue, lethargy and confusion EXAM: CT HEAD WITHOUT CONTRAST TECHNIQUE: Contiguous axial images were obtained from the base of the skull through the vertex without contrast. COMPARISON:  08/27/2014 . FINDINGS: stable diffuse brain atrophy pattern and chronic periventricular white matter microvascular ischemic change. No acute intracranial hemorrhage, mass lesion, definite infarction, midline shift, herniation, hydrocephalus, or extra-axial fluid collection. No focal mass  effect or edema. Mild cerebellar atrophy as well. Orbits are symmetric. Sinuses and mastoids remain clear. Skull appears intact IMPRESSION: Stable atrophy pattern and minor periventricular white matter microvascular ischemic change. No interval change or acute process by noncontrast CT. Electronically Signed   By: Jerilynn Mages.  Shick M.D.   On: 07/06/2015 12:56   Dg Abd Portable 2v  07/06/2015  CLINICAL DATA:  Right lower quadrant abdominal pain and vomiting today EXAM: PORTABLE ABDOMEN - 2 VIEW COMPARISON:  09/04/2014 CT abdomen/pelvis FINDINGS: No disproportionately dilated small bowel loops or fluid levels to suggest small bowel obstruction. Mild diffuse gaseous distention of the stool filled colon and rectum. No evidence of pneumatosis or pneumoperitoneum. Mild patchy opacity at the left lung base. Marked  degenerative changes throughout the visualized thoracolumbar spine and atherosclerotic calcifications throughout the abdomen and pelvis. IMPRESSION: Nonobstructive bowel gas pattern.  No free air. Patchy left lung base opacity, correlate with chest radiograph. Electronically Signed   By: Ilona Sorrel M.D.   On: 07/06/2015 20:44    Scheduled Meds: . azithromycin  500 mg Intravenous Q24H  . cefTRIAXone (ROCEPHIN)  IV  1 g Intravenous Q24H  . [START ON 07/13/2015] cloNIDine  0.3 mg Transdermal Weekly  . feeding supplement (ENSURE ENLIVE)  237 mL Oral BID BM  . hydrALAZINE  25 mg Oral TID  . insulin glargine  15 Units Subcutaneous Daily  . labetalol  400 mg Oral BID  . pantoprazole  40 mg Oral Daily  . polyethylene glycol  17 g Oral Daily  . pravastatin  20 mg Oral q1800  . sodium chloride  3 mL Intravenous Q12H  . warfarin  2 mg Oral Once per day on Sun Mon Tue Wed Fri Sat  . [START ON 07/12/2015] warfarin  4 mg Oral Q Thu-1800  . Warfarin - Pharmacist Dosing Inpatient   Does not apply q1800   Continuous Infusions:      Time spent: [place], [place] [facility] Pager [phone]. If 7PM-7AM, please contact night-coverage at [email or web address], password [facility] 07/08/2015, 10:58 AM  LOS: 2 days

## 2015-07-08 NOTE — Progress Notes (Signed)
ANTICOAGULATION CONSULT NOTE - [place] for warfarin Indication: History of DVT/PE  Allergies  Allergen Reactions  . Amiodarone Other (See Comments)    REACTION: f? fluid retention  . Deltasone [Prednisone] Other (See Comments)    unknown  . Diltiazem Hcl Other (See Comments)    unknown  . Levofloxacin Nausea And Vomiting    REACTION: nausea  . Pregabalin Other (See Comments)    REACTION: hallucinations  . Spironolactone Other (See Comments)    REACTION: elevated K at lowest dose  . Tequin [Gatifloxacin] Other (See Comments)    Hypoglycemia  . Diazepam Anxiety and Other (See Comments)    REACTION: agitation    Vital Signs: Temp: 98 F (36.7 C) (11/06 0420) Temp Source: Oral (11/06 0420) BP: 137/47 mmHg (11/06 0420) Pulse Rate: 82 (11/06 0420)  Labs:  Recent Labs  07/06/15 1220 07/07/15 0539 07/08/15 0244  HGB 11.5* 11.8* 10.7*  HCT 35.6* 37.0 34.3*  PLT PLATELETS APPEAR ADEQUATE 191 163  LABPROT 24.2* 27.3* 36.4*  INR 2.20* 2.57* 3.78*  CREATININE 0.98 1.24* 1.35*    Estimated Creatinine Clearance: 23.9 mL/min (by C-G formula based on Cr of 1.35).   Medical History: Past Medical History  Diagnosis Date  . POSTHERPETIC NEURALGIA 11/02/2009  . B12 DEFICIENCY 05/23/2007  . HYPERLIPIDEMIA 05/23/2007  . HYPERKALEMIA 05/30/2010  . ANEMIA-IRON DEFICIENCY 01/26/2007  . BLEPHARITIS, BILATERAL 02/23/2008  . Other specified forms of hearing loss 05/30/2010  . HYPERTENSION 01/26/2007  . Atrial fibrillation ([place]) 05/23/2007  . DIASTOLIC HEART FAILURE, CHRONIC 12/14/2009  . POSTURAL HYPOTENSION 02/07/2008  . ALLERGIC RHINITIS 11/02/2009  . GERD 01/26/2007  . CONSTIPATION 01/25/2008  . SKIN LESION 05/30/2010  . BACK PAIN 08/02/2007  . OSTEOPOROSIS 05/23/2007  . SYNCOPE 02/07/2008  . FATIGUE 09/06/2007  . Dysuria 09/05/2008  . Abdominal pain, epigastric 09/06/2007  . EPIGASTRIC TENDERNESS 10/12/2007  . PULMONARY EMBOLISM, HX OF 05/23/2007  . SHINGLES, HX OF  05/23/2007  . 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 Left foot pain   . Diabetic neuropathy ([place])   . RENAL INSUFFICIENCY 07/02/2009  . RENAL CYST, LEFT 09/06/2007  . Kidney stones     "never had OR"  . Family history of anesthesia complication     "daughter has PONV; another daughter a little anesthesia lasts way too long"  . CHF (congestive heart failure) ([place]) 2011  . Myocardial infarction [facility]) 2011    "mild"  . DVT (deep venous thrombosis) ([place]) ~ 1964    "think it was in the left"  . Aspiration pneumonia ([place]) ~ 2006    "due to aspiration"  . DIABETES MELLITUS, TYPE II 09/06/2007  . DEPRESSIVE DISORDER 01/25/2008    "@ times; never treated for it"  . Melanoma of nose ([place])   . Compression fracture of lumbar vertebra ([facility])     hx  . Compression fracture of thoracic vertebra ([place]) 02/07/2014    "fell this am" (02/07/2014  . Arthritis     "hands" (03/15/2014)  . Foley catheter in place on admission     Assessment: 81 yo F presenting to the ED on 11/4 for confusion and lethargy. Patient is on chronic coumadin at home for history of a PE and DVT. On coumadin 2mg  daily exc 4mg  on Thurs.   INR now elevated - question if related to nausea/poor PO intake vs DDI with antibiotics.  Of note, Coumadin dose missed 11/4 due to N/V. Given 11/5. Hold 11/6.  Goal of Therapy:  INR 2-3  Monitor platelets by anticoagulation protocol: Yes   Plan:  No Coumadin today. Monitor daily INR, CBC, s/s of bleed  [employer], Pharm.D., BCPS Clinical Pharmacist Pager [phone] 07/08/2015 11:26 AM

## 2015-07-09 ENCOUNTER — Other Ambulatory Visit: Payer: Self-pay

## 2015-07-09 ENCOUNTER — Inpatient Hospital Stay (HOSPITAL_COMMUNITY): Payer: Medicare Other

## 2015-07-09 DIAGNOSIS — R110 Nausea: Secondary | ICD-10-CM

## 2015-07-09 LAB — CBC
HCT: 34.6 % — ABNORMAL LOW (ref 36.0–46.0)
Hemoglobin: 10.7 g/dL — ABNORMAL LOW (ref 12.0–15.0)
MCH: 27.6 pg (ref 26.0–34.0)
MCHC: 30.9 g/dL (ref 30.0–36.0)
MCV: 89.4 fL (ref 78.0–100.0)
Platelets: 159 10*3/uL (ref 150–400)
RBC: 3.87 MIL/uL (ref 3.87–5.11)
RDW: 15.3 % (ref 11.5–15.5)
WBC: 5.7 10*3/uL (ref 4.0–10.5)

## 2015-07-09 LAB — PROTIME-INR
INR: 3.16 — ABNORMAL HIGH (ref 0.00–1.49)
Prothrombin Time: 31.8 seconds — ABNORMAL HIGH (ref 11.6–15.2)

## 2015-07-09 LAB — GLUCOSE, CAPILLARY
Glucose-Capillary: 236 mg/dL — ABNORMAL HIGH (ref 65–99)
Glucose-Capillary: 139 mg/dL — ABNORMAL HIGH (ref 65–99)

## 2015-07-09 LAB — BASIC METABOLIC PANEL
Anion gap: 8 (ref 5–15)
BUN: 25 mg/dL — ABNORMAL HIGH (ref 6–20)
CO2: 24 mmol/L (ref 22–32)
Calcium: 9 mg/dL (ref 8.9–10.3)
Chloride: 108 mmol/L (ref 101–111)
Creatinine, Ser: 1.21 mg/dL — ABNORMAL HIGH (ref 0.44–1.00)
GFR calc Af Amer: 44 mL/min — ABNORMAL LOW (ref >60)
GFR calc non Af Amer: 38 mL/min — ABNORMAL LOW (ref >60)
Glucose, Bld: 133 mg/dL — ABNORMAL HIGH (ref 65–99)
Potassium: 4.1 mmol/L (ref 3.5–5.1)
Sodium: 140 mmol/L (ref 135–145)

## 2015-07-09 LAB — LEGIONELLA PNEUMOPHILA SEROGP 1 UR AG: L. pneumophila Serogp 1 Ur Ag: NEGATIVE

## 2015-07-09 MED ORDER — DOCUSATE SODIUM 50 MG/5ML PO LIQD: 100.0000 mg | Freq: Every day | ORAL | Status: DC

## 2015-07-09 MED ORDER — INSULIN ASPART 100 UNIT/ML ~~LOC~~ SOLN
0.0000 [IU] | Freq: Every day | SUBCUTANEOUS | Status: DC
Start: 2015-07-09 — End: 2015-07-10
  Administered 2015-07-09: 2 [IU] via SUBCUTANEOUS

## 2015-07-09 MED ORDER — WARFARIN SODIUM 1 MG PO TABS
1.0000 mg | ORAL_TABLET | Freq: Once | ORAL | Status: AC
  Administered 2015-07-09: 1 mg via ORAL
  Filled 2015-07-09: qty 1

## 2015-07-09 MED ORDER — INSULIN ASPART 100 UNIT/ML ~~LOC~~ SOLN
0.0000 [IU] | Freq: Three times a day (TID) | SUBCUTANEOUS | Status: DC
  Administered 2015-07-09: 3 [IU] via SUBCUTANEOUS
  Administered 2015-07-10: 2 [IU] via SUBCUTANEOUS
  Administered 2015-07-10: 1 [IU] via SUBCUTANEOUS

## 2015-07-09 MED ORDER — SENNOSIDES 8.8 MG/5ML PO SYRP
5.0000 mL | ORAL_SOLUTION | Freq: Every day | ORAL | Status: DC
  Administered 2015-07-09 – 2015-07-10 (×2): 5 mL via ORAL
  Filled 2015-07-09 (×2): qty 5

## 2015-07-09 NOTE — Progress Notes (Signed)
[facility]  Patient Status: Active up until hospitalization  [facility] is providing the following services: SN  If patient discharges after hours, please call [phone].   Lindsay Sheppard 07/09/2015, 10:32 AM

## 2015-07-09 NOTE — Patient Outreach (Signed)
[facility]) Care Management  07/09/2015  Lindsay Sheppard August 10, 1921 [id number]   Patient noted to be admitted to the hospital on 07-06-15 for pneumonia.  Message sent to hospital liaison for hospital follow up.    Plan: RN Health Coach will close program and send update to PCP.  Jone Baseman, RN, MSN [place] [phone]

## 2015-07-09 NOTE — Care Management Important Message (Signed)
Important Message  Patient Details  Name: Lindsay Sheppard MRN: [id number] Date of Birth: Sep 13, 1920   Medicare Important Message Given:  Yes-second notification given    Loann Quill 07/09/2015, 1:44 PM

## 2015-07-09 NOTE — Progress Notes (Signed)
OT Cancellation Note  Patient Details Name: Lindsay Sheppard MRN: [id number] DOB: 1920/10/15   Cancelled Treatment:    Reason Eval/Treat Not Completed: Patient at procedure or test/ unavailable. Pt going to modified barium swallow procedure.  Lin Landsman 07/09/2015, 1:29 PM

## 2015-07-09 NOTE — NC FL2 (Signed)
[place] LEVEL OF CARE SCREENING TOOL     IDENTIFICATION  Patient Name: Lindsay Sheppard Birthdate: 11-13-1920 Sex: female Admission Date (Current Location): 07/06/2015  [facility] and [place] Number: Herbalist and Address:  [facility] [place], [place] [address] [place], [place] [address]      Provider Number: [id number]  Attending Physician Name and Address:  Louellen Molder, MD  Relative Name and Phone Number:  Luanna Salk - [phone]    Current Level of Care: Hospital Recommended Level of Care: [place] Prior Approval Number:    Date Approved/Denied:   PASRR Number: [id number] (Effective 02/08/14)  Discharge Plan: SNF    Current Diagnoses: Patient Active Problem List   Diagnosis Date Noted  . CAP (community acquired pneumonia) 07/06/2015  . RLQ abdominal pain 07/06/2015  . Lethargy 07/06/2015  . Foley catheter in place on admission 07/06/2015  . Hypertensive emergency 07/06/2015  . Cloudy urine 04/19/2015  . Abnormal breath sounds 04/19/2015  . Non-healing skin lesion of nose 12/05/2014  . Peripheral edema 12/05/2014  . Urinary tract infectious disease   . Palliative care encounter   . Loss of weight   . Flank pain   . Abdominal pain 09/05/2014  . Colonic mass 09/05/2014  . UTI (lower urinary tract infection) 09/05/2014  . Urinary tract infection 09/04/2014  . Accelerated hypertension 08/27/2014  . Chronic atrial fibrillation ([place]) 08/27/2014  . Diabetes mellitus type 2, controlled ([place]) 08/27/2014  . Dementia 08/08/2014  . Nocturia 08/08/2014  . Malignant hypertension 03/19/2014  . Protein-calorie malnutrition, severe ([place]) 03/16/2014  . Stroke-like episode ([place]) 03/15/2014  . 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 Supratherapeutic INR 02/09/2014  . Recurrent UTI 02/09/2014  . Fractures 02/09/2014  . Chronic constipation 02/08/2014  . Fall 02/07/2014  . Compression fracture 02/07/2014  .  Syncope 02/07/2014  . Encounter for therapeutic drug monitoring 09/27/2013  . Foot pain 09/27/2013  . Rectal pain 09/27/2013  . Hearing loss, right 09/27/2013  . Left foot pain 05/12/2013  . Left shoulder pain 03/22/2013  . Abdominal pain, other specified site 06/23/2012  . Fever 03/24/2012  . Lumbar back pain 03/10/2012  . Gait disorder 03/10/2012  . Fatigue 09/04/2011  . 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 Preventative health care 11/29/2010  . Pulmonary embolism ([place]) 10/14/2010  . Hearing loss 05/30/2010  . DIASTOLIC HEART FAILURE, CHRONIC 12/14/2009  . POSTHERPETIC NEURALGIA 11/02/2009  . ALLERGIC RHINITIS 11/02/2009  . RENAL INSUFFICIENCY 07/02/2009  . POSTURAL HYPOTENSION 02/07/2008  . SYNCOPE 02/07/2008  . DEPRESSIVE DISORDER 01/25/2008  . CONSTIPATION 01/25/2008  . Diabetes ([place]) 09/06/2007  . RENAL CYST, LEFT 09/06/2007  . B12 DEFICIENCY 05/23/2007  . Hyperlipidemia 05/23/2007  . Atrial fibrillation ([place]) 05/23/2007  . OSTEOPOROSIS 05/23/2007  . SHINGLES, HX OF 05/23/2007  . ANEMIA-IRON DEFICIENCY 01/26/2007  . Essential hypertension 01/26/2007  . GERD 01/26/2007    Orientation ACTIVITIES/SOCIAL BLADDER RESPIRATION    Self  Passive, Family supportive Continent (Urinary catheter placed 07/06/15) Normal  BEHAVIORAL SYMPTOMS/MOOD NEUROLOGICAL BOWEL NUTRITION STATUS      Continent Diet (DYS 1)  PHYSICIAN VISITS COMMUNICATION OF NEEDS Height & Weight Skin    Verbally   134 lbs. Normal          AMBULATORY STATUS RESPIRATION    Assist extensive (Full mobility assessment could not be completed as pt. had just received a suppository.) Normal      Personal Care Assistance Level of Assistance  Bathing, Feeding, Dressing  Bathing Assistance: Maximum assistance Feeding assistance: Independent Dressing Assistance: Maximum assistance      Functional Limitations Info  Hearing   Hearing Info: Impaired (Patient is deaf)         [place]   PT (By licensed PT), Speech therapy     PT Frequency: PT evaluation 07/07/15 - Minimum 3 times per week recommended       Speech Therapy Frequency: Speech evaluation 07/07/15     Additional Factors Info  Code Status, Allergies Code Status Info: DNR Allergies Info: Amiodarone, Deltasone, Diltiazem Hcl, Levofloxacin, Pregabalin, Spironolactone, Tequin, Diazepam           Current Medications (07/09/2015): Current Facility-Administered Medications  Medication Dose Route Frequency Provider Last Rate Last Dose  . acetaminophen (TYLENOL) tablet 650 mg  650 mg Oral Q6H PRN Melton Alar, PA-C   650 mg at 07/08/15 2046   Or  . acetaminophen (TYLENOL) suppository 650 mg  650 mg Rectal Q6H PRN Melton Alar, PA-C      . alum & mag hydroxide-simeth (MAALOX/MYLANTA) 200-200-20 MG/5ML suspension 30 mL  30 mL Oral Q6H PRN Melton Alar, PA-C      . artificial tears (LACRILUBE) ophthalmic ointment 1 application  1 application Both Eyes Daily PRN Melton Alar, PA-C      . azithromycin (ZITHROMAX) 500 mg in dextrose 5 % 250 mL IVPB  500 mg Intravenous Q24H Melton Alar, PA-C   500 mg at 07/08/15 1625  . cefTRIAXone (ROCEPHIN) 1 g in dextrose 5 % 50 mL IVPB  1 g Intravenous Q24H Melton Alar, PA-C   1 g at 07/08/15 1843  . [START ON 07/13/2015] cloNIDine (CATAPRES - Dosed in mg/24 hr) patch 0.3 mg  0.3 mg Transdermal Weekly Marianne L York, PA-C      . feeding supplement (ENSURE ENLIVE) (ENSURE ENLIVE) liquid 237 mL  237 mL Oral BID BM Ripudeep K Rai, MD   237 mL at 07/09/15 1000  . hydrALAZINE (APRESOLINE) injection 10 mg  10 mg Intravenous Q6H PRN Melton Alar, PA-C   10 mg at 07/07/15 0351  . hydrALAZINE (APRESOLINE) tablet 25 mg  25 mg Oral TID Nishant Dhungel, MD   25 mg at 07/09/15 1009  . insulin aspart (novoLOG) injection 0-5 Units  0-5 Units Subcutaneous QHS Nishant Dhungel, MD      . insulin aspart (novoLOG) injection 0-9 Units  0-9 Units Subcutaneous TID WC Nishant Dhungel,  MD      . insulin glargine (LANTUS) injection 15 Units  15 Units Subcutaneous Daily Melton Alar, PA-C   15 Units at 07/09/15 1010  . labetalol (NORMODYNE) tablet 400 mg  400 mg Oral BID Melton Alar, PA-C   400 mg at 07/09/15 1009  . ondansetron (ZOFRAN) tablet 4 mg  4 mg Oral Q6H PRN Melton Alar, PA-C       Or  . ondansetron (ZOFRAN) injection 4 mg  4 mg Intravenous Q6H PRN Melton Alar, PA-C   4 mg at 07/09/15 1019  . pantoprazole (PROTONIX) EC tablet 40 mg  40 mg Oral Daily Melton Alar, PA-C   40 mg at 07/09/15 1009  . polyethylene glycol (MIRALAX / GLYCOLAX) packet 17 g  17 g Oral Daily Nishant Dhungel, MD   17 g at 07/09/15 1019  . pravastatin (PRAVACHOL) tablet 20 mg  20 mg Oral q1800 Melton Alar, PA-C   20 mg at 07/08/15 1636  . promethazine (PHENERGAN)  injection 6.25 mg  6.25 mg Intravenous Q6H PRN Gardiner Barefoot, NP   6.25 mg at 07/06/15 2354  . RESOURCE THICKENUP CLEAR   Oral PRN Nishant Dhungel, MD      . sennosides (SENOKOT) 8.8 MG/5ML syrup 5 mL  5 mL Oral Daily Wynell Balloon, RPH      . sodium chloride 0.9 % injection 3 mL  3 mL Intravenous Q12H Marianne L York, PA-C   3 mL at 07/09/15 1000  . warfarin (COUMADIN) tablet 1 mg  1 mg Oral ONCE-1800 Wendee Beavers, [facility]      . Warfarin - Pharmacist Dosing Inpatient   Does not apply [place], [place] at 07/08/15 1800   Do not use this list as official medication orders. Please verify with discharge summary.  Discharge Medications:   Medication List    ASK your doctor about these medications        ACCU-CHEK COMPACT PLUS test strip  Generic drug:  glucose blood     acetaminophen 325 MG tablet  Commonly known as:  TYLENOL  Take 2 tablets (650 mg total) by mouth 3 (three) times daily.     amoxicillin-clavulanate 875-125 MG tablet  Commonly known as:  AUGMENTIN  Take 1 tablet by mouth 2 (two) times daily.     ARTIFICIAL TEARS OP  Place 1 drop into both eyes daily as needed. For  dry eyes     B-D INS SYRINGE 0.5CC/31GX5/16 31G X 5/16" 0.5 ML Misc  Generic drug:  Insulin Syringe-Needle U-100     cloNIDine 0.3 mg/24hr patch  Commonly known as:  CATAPRES - Dosed in mg/24 hr  Place 0.3 mg onto the skin once a week.     CRANBERRY PO  Take 500 mg by mouth daily.     cyanocobalamin 1000 MCG/ML injection  Commonly known as:  (VITAMIN B-12)  INJECT 1 ML INTO THE MUSCLE EVERY 30 DAYS     donepezil 5 MG tablet  Commonly known as:  ARICEPT  Take 1 tablet (5 mg total) by mouth at bedtime.     ferrous sulfate 325 (65 FE) MG tablet  Take 1 tablet (325 mg total) by mouth 2 (two) times daily with a meal.     fluticasone 50 MCG/ACT nasal spray  Commonly known as:  FLONASE  Place 2 sprays into the nose daily.     furosemide 20 MG tablet  Commonly known as:  LASIX  1 tab by mouth per day as needed for swelling     hydrALAZINE 25 MG tablet  Commonly known as:  APRESOLINE  Take 1 tablet (25 mg total) by mouth 3 (three) times daily.     insulin glargine 100 UNIT/ML injection  Commonly known as:  LANTUS  Inject 0.2 mLs (20 Units total) into the skin daily.     labetalol 200 MG tablet  Commonly known as:  NORMODYNE  Take 400 mg by mouth 2 (two) times daily.     pantoprazole 40 MG tablet  Commonly known as:  PROTONIX  TAKE 1 BY MOUTH DAILY     polyethylene glycol packet  Commonly known as:  MIRALAX / GLYCOLAX  Take 17 g by mouth 2 (two) times daily.     pravastatin 40 MG tablet  Commonly known as:  PRAVACHOL  TAKE 1/2 BY MOUTH DAILY     warfarin 2 MG tablet  Commonly known as:  COUMADIN  Take as directed by anticoagulation clinic  Relevant Imaging Results:  Relevant Lab Results:  Recent Labs    Additional Information Patient should be ready for discharge Tuesday, 11/8  Sable Feil, LCSW

## 2015-07-09 NOTE — Progress Notes (Signed)
PT Cancellation Note  Patient Details Name: CLIO GERHART MRN: [id number] DOB: Aug 25, 1921   Cancelled Treatment:    Reason Eval/Treat Not Completed: Patient at procedure or test/unavailable (radiology).  Will try later as able.   Ramond Dial 07/09/2015, 11:55 AM  Mee Hives, PT MS Acute Rehab Dept. Number: [facility] [id number] and [place] [phone]

## 2015-07-09 NOTE — Progress Notes (Addendum)
Speech Language Pathology Treatment: Dysphagia  Patient Details Name: ARANZA GEDDES MRN: [id number] DOB: October 09, 1920 Today's Date: 07/09/2015 Time: 9643-8381 SLP Time Calculation (min) (ACUTE ONLY): 20 min  Assessment / Plan / Recommendation Clinical Impression  Daughter present assisting pt with Murelax mixed with water thickened to a honey consistency versus recommended pudding. Pt provided total assist for feeding due to tremors with honey thick for potential upgrade. Discoordinated respiratory swallow pattern present (? inhalation post swallow). MBS is recommended to fully assess oral and pharyngeal phases and recommend safest po's prior to discharge. MBS scheduled for 1330 today.    HPI Other Pertinent Information: Pt is a 81 year old female with complex PMH including dementia, recently diagnosed with UTI and treated with keflex 5 days ago, who presents with lethargy and increased confusion per the patient's family. CT of head negative for acute intracranial abnormalities. CT of abdomen significant for patchy left lung base opacity.    Pertinent Vitals Pain Assessment:  (left leg pain) Pain Intervention(s):  (dtr rubbed leg)  SLP Plan  Continue with current plan of care;MBS    Recommendations Diet recommendations: Dysphagia 1 (puree);Pudding-thick liquid Liquids provided via: Cup;Teaspoon Medication Administration: Crushed with puree Supervision: Trained caregiver to feed patient;Full supervision/cueing for compensatory strategies Compensations: Slow rate;Small sips/bites Postural Changes and/or Swallow Maneuvers: Seated upright 90 degrees              Oral Care Recommendations: Oral care BID Follow up Recommendations: Skilled Nursing facility Plan: Continue with current plan of care;MBS    GO     Houston Siren 07/09/2015, 10:58 AM   Orbie Pyo Colvin Caroli.Ed [employer] [phone]

## 2015-07-09 NOTE — Progress Notes (Signed)
[facility] PROGRESS NOTE  EMMANUELA GHAZI IWP:[id number] DOB: 1921-06-14 DOA: 07/06/2015 PCP: [name], MD  Brief narrative 81 year old female with history of hypertension, type 2 diabetes mellitus, history of DVT and PE on chronic Coumadin, chronic Foley catheter, recently diagnosed UTI for which she presented to the ED and discharged on Keflex return to the ED after having nausea and writing with right lower quadrant abdominal pain. She was also lethargic and encephalopathic. In the ED UA was positive for UTI and chest x-ray showed left base opacity. CT head was unremarkable. She also had hypertensive urgency. Patient placed on empiric antibiotics. Foley catheter was change in the ED. Cultures sent and admitted to medical floor.  Assessment/Plan: Acute encephalopathy Possibly associated with UTI, dehydration and currently acquired pneumonia. On empiric Rocephin and azithromycin. Patient much oriented today. gentle hydration. Blood cultures negative. Urine cultures with multiple species. Maintain aspiration precaution. Speech and swallow following.  Right lower quadrant abdominal pain with nausea Secondary to constipation which improved with bowel movement..  continue with stool softeners and when necessary antiemetics.. Patient also has a 5.84.6 cm mass in right: Which was found on CT scan in 09/2014. Family had opted not to work this up.   UTI and community-acquired pneumonia As above. Has a chronic Foley which was changed in the ED. blood cultures negative. Urine cultures with mixed species.  accelerated  Hypertension/hypertensive urgency Likely contributed by primary symptoms. Home dose of labetalol, hydralazine  and clonidine has been resumed. Continue to monitor.  Type 2 diabetes mellitus with neuropathy Controlled. Continue with Lantus and sliding scale insulin.  Acute kidney injury Likely secondary to dehydration.  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  Iron deficiency anemia Continue supplement  GERD Continue PPI  Protein calorie malnutrition Continue supplement  DVT prophylaxis: On Coumadin  Diet: Dysphagia level I. Speech and swallow following  Code Status: DO NOT RESUSCITATE Family Communication: Daughter at bedside Disposition Plan: skilled nursing facility possibly  tomorrow   Consultants:  None  Procedures:  Head CT  Antibiotics:  IV Rocephin and azithromycin  HPI/Subjective: Seen and examined. Complains of gaseous abdominal pain and nausea  Objective: Filed Vitals:   07/09/15 0907  BP: 195/76  Pulse: 96  Temp: 97 F (36.1 C)  Resp: 16    Intake/Output Summary (Last 24 hours) at 07/09/15 1232 Last data filed at 07/09/15 5053  Gross per 24 hour  Intake    540 ml  Output    250 ml  Net    290 ml   Filed Weights   07/06/15 1656 07/07/15 2038 07/08/15 2119  Weight: 56.2 kg (123 lb 14.4 oz) 58.2 kg (128 lb 4.9 oz) 60.8 kg (134 lb 0.6 oz)    Exam:   General:  Elderly female not in distress  HEENT: Mucosa, supple neck  Chest: Clear to auscultation bilaterally  CVS: Normal S1 and S2, no murmurs  GI: Soft, nondistended, nontender bowel sounds present, has chronic foley  Musculoskeletal: Warm, no edema  CNS: Awake and alert, oriented at baseline     Data Reviewed: Basic Metabolic Panel:  Recent Labs Lab 07/06/15 1220 07/07/15 0539 07/08/15 0244 07/09/15 0406  NA 138 139 138 140  K 4.2 4.2 4.2 4.1  CL 109 105 105 108  CO2 21* 25 25 24   GLUCOSE 126* 73 223* 133*  BUN 23* 22* 29* 25*  CREATININE 0.98 1.24* 1.35* 1.21*  CALCIUM 9.1 9.3 9.0 9.0   Liver Function Tests:  Recent Labs Lab 07/06/15 1220  AST 22  ALT 20  ALKPHOS 55  BILITOT 0.6  PROT 5.0*  ALBUMIN 3.1*   No results for input(s): LIPASE, AMYLASE in the last 168 hours. No results for input(s): AMMONIA in the last 168 hours. CBC:  Recent Labs Lab 07/06/15 1220 07/07/15 0539 07/08/15 0244  07/09/15 0406  WBC 4.8 6.7 5.7 5.7  NEUTROABS 3.7  --   --   --   HGB 11.5* 11.8* 10.7* 10.7*  HCT 35.6* 37.0 34.3* 34.6*  MCV 87.5 88.3 89.3 89.4  PLT PLATELETS APPEAR ADEQUATE 191 163 159   Cardiac Enzymes: No results for input(s): CKTOTAL, CKMB, CKMBINDEX, TROPONINI in the last 168 hours. BNP (last 3 results) No results for input(s): BNP in the last 8760 hours.  ProBNP (last 3 results) No results for input(s): PROBNP in the last 8760 hours.  CBG:  Recent Labs Lab 07/06/15 1653 07/06/15 2058 07/07/15 0726 07/07/15 1206 07/07/15 1703  GLUCAP 151* 107* 76 114* 190*    Recent Results (from the past 240 hour(s))  Urine culture     Status: None   Collection Time: 07/01/15  2:50 PM  Result Value Ref Range Status   Specimen Description URINE, RANDOM  Final   Special Requests NONE  Final   Culture MULTIPLE SPECIES PRESENT, SUGGEST RECOLLECTION  Final   Report Status 07/02/2015 FINAL  Final  Culture, Urine     Status: None   Collection Time: 07/06/15  1:00 PM  Result Value Ref Range Status   Specimen Description URINE, RANDOM  Final   Special Requests NONE  Final   Culture MULTIPLE SPECIES PRESENT, SUGGEST RECOLLECTION  Final   Report Status 07/08/2015 FINAL  Final  Blood culture (routine x 2)     Status: None (Preliminary result)   Collection Time: 07/06/15  2:00 PM  Result Value Ref Range Status   Specimen Description BLOOD LEFT ARM  Final   Special Requests BOTTLES DRAWN AEROBIC AND ANAEROBIC 5CC  Final   Culture NO GROWTH 3 DAYS  Final   Report Status PENDING  Incomplete  Blood culture (routine x 2)     Status: None (Preliminary result)   Collection Time: 07/06/15  2:05 PM  Result Value Ref Range Status   Specimen Description BLOOD RIGHT HAND  Final   Special Requests BOTTLES DRAWN AEROBIC AND ANAEROBIC 5CC  Final   Culture NO GROWTH 3 DAYS  Final   Report Status PENDING  Incomplete     Studies: No results found.  Scheduled Meds: . azithromycin  500 mg  Intravenous Q24H  . cefTRIAXone (ROCEPHIN)  IV  1 g Intravenous Q24H  . [START ON 07/13/2015] cloNIDine  0.3 mg Transdermal Weekly  . docusate  100 mg Oral Daily  . feeding supplement (ENSURE ENLIVE)  237 mL Oral BID BM  . hydrALAZINE  25 mg Oral TID  . insulin aspart  0-5 Units Subcutaneous QHS  . insulin aspart  0-9 Units Subcutaneous TID WC  . insulin glargine  15 Units Subcutaneous Daily  . labetalol  400 mg Oral BID  . pantoprazole  40 mg Oral Daily  . polyethylene glycol  17 g Oral Daily  . pravastatin  20 mg Oral q1800  . sodium chloride  3 mL Intravenous Q12H  . warfarin  1 mg Oral ONCE-1800  . Warfarin - Pharmacist Dosing Inpatient   Does not apply q1800   Continuous Infusions:      Time spent: 25 MINUTES  Louellen Molder  [facility] Pager [phone]. If 7PM-7AM, please contact night-coverage at [email or web address], password [facility] 07/09/2015, 12:32 PM  LOS: 3 days

## 2015-07-09 NOTE — Progress Notes (Signed)
Speech Pathology  MBSS complete. Full report located under chart review in imaging section. Recommend: Dys 2, nectar thick, pills whole in applesauce.    Lindsay Sheppard.Ed [employer] [phone]

## 2015-07-09 NOTE — Progress Notes (Signed)
ANTICOAGULATION CONSULT NOTE - [place] for warfarin Indication: History of DVT/PE  Allergies  Allergen Reactions  . Amiodarone Other (See Comments)    REACTION: f? fluid retention  . Deltasone [Prednisone] Other (See Comments)    unknown  . Diltiazem Hcl Other (See Comments)    unknown  . Levofloxacin Nausea And Vomiting    REACTION: nausea  . Pregabalin Other (See Comments)    REACTION: hallucinations  . Spironolactone Other (See Comments)    REACTION: elevated K at lowest dose  . Tequin [Gatifloxacin] Other (See Comments)    Hypoglycemia  . Diazepam Anxiety and Other (See Comments)    REACTION: agitation    Vital Signs: Temp: 97 F (36.1 C) (11/07 0907) Temp Source: Oral (11/07 0907) BP: 195/76 mmHg (11/07 0907) Pulse Rate: 96 (11/07 0907)  Labs:  Recent Labs  07/07/15 0539 07/08/15 0244 07/09/15 0406  HGB 11.8* 10.7* 10.7*  HCT 37.0 34.3* 34.6*  PLT 191 163 159  LABPROT 27.3* 36.4* 31.8*  INR 2.57* 3.78* 3.16*  CREATININE 1.24* 1.35* 1.21*    Estimated Creatinine Clearance: 26.7 mL/min (by C-G formula based on Cr of 1.21).   Medical History: Past Medical History  Diagnosis Date  . POSTHERPETIC NEURALGIA 11/02/2009  . B12 DEFICIENCY 05/23/2007  . HYPERLIPIDEMIA 05/23/2007  . HYPERKALEMIA 05/30/2010  . ANEMIA-IRON DEFICIENCY 01/26/2007  . BLEPHARITIS, BILATERAL 02/23/2008  . Other specified forms of hearing loss 05/30/2010  . HYPERTENSION 01/26/2007  . Atrial fibrillation ([place]) 05/23/2007  . DIASTOLIC HEART FAILURE, CHRONIC 12/14/2009  . POSTURAL HYPOTENSION 02/07/2008  . ALLERGIC RHINITIS 11/02/2009  . GERD 01/26/2007  . CONSTIPATION 01/25/2008  . SKIN LESION 05/30/2010  . BACK PAIN 08/02/2007  . OSTEOPOROSIS 05/23/2007  . SYNCOPE 02/07/2008  . FATIGUE 09/06/2007  . Dysuria 09/05/2008  . Abdominal pain, epigastric 09/06/2007  . EPIGASTRIC TENDERNESS 10/12/2007  . PULMONARY EMBOLISM, HX OF 05/23/2007  . SHINGLES, HX OF 05/23/2007  . 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 Left foot pain   . Diabetic neuropathy ([place])   . RENAL INSUFFICIENCY 07/02/2009  . RENAL CYST, LEFT 09/06/2007  . Kidney stones     "never had OR"  . Family history of anesthesia complication     "daughter has PONV; another daughter a little anesthesia lasts way too long"  . CHF (congestive heart failure) ([place]) 2011  . Myocardial infarction [facility]) 2011    "mild"  . DVT (deep venous thrombosis) ([place]) ~ 1964    "think it was in the left"  . Aspiration pneumonia ([place]) ~ 2006    "due to aspiration"  . DIABETES MELLITUS, TYPE II 09/06/2007  . DEPRESSIVE DISORDER 01/25/2008    "@ times; never treated for it"  . Melanoma of nose ([place])   . Compression fracture of lumbar vertebra ([facility])     hx  . Compression fracture of thoracic vertebra ([place]) 02/07/2014    "fell this am" (02/07/2014  . Arthritis     "hands" (03/15/2014)  . Foley catheter in place on admission     Assessment: 81 yo F presented to the ED on 11/4 for confusion and lethargy. Patient is on chronic coumadin at home for history of a PE and DVT. On coumadin 2mg  daily exc 4mg  on Thurs.   On admission 11/4 the INR was therapeutic at 2.52. Yesterday INR was increased to 3.78, coumadin dose held and today INR = 3.16. Likely related to drug interaction with azithromycin and decreased PO intake due to nausea.  Of note, Coumadin dose missed 11/4 due to N/V. Given 11/5. Held 11/6.  Goal of Therapy:  INR 2-3 Monitor platelets by anticoagulation protocol: Yes   Plan:  Give coumadin 1mg  today. Monitor daily INR, CBC, s/s of bleed   Nicole Cella, RPh Clinical Pharmacist Pager: [phone] 07/09/2015 11:43 AM

## 2015-07-10 ENCOUNTER — Ambulatory Visit: Payer: Self-pay

## 2015-07-10 DIAGNOSIS — Z978 Presence of other specified devices: Secondary | ICD-10-CM

## 2015-07-10 DIAGNOSIS — Z960 Presence of urogenital implants: Secondary | ICD-10-CM

## 2015-07-10 DIAGNOSIS — N179 Acute kidney failure, unspecified: Secondary | ICD-10-CM

## 2015-07-10 DIAGNOSIS — G9340 Encephalopathy, unspecified: Secondary | ICD-10-CM | POA: Insufficient documentation

## 2015-07-10 DIAGNOSIS — Z7901 Long term (current) use of anticoagulants: Secondary | ICD-10-CM

## 2015-07-10 DIAGNOSIS — E43 Unspecified severe protein-calorie malnutrition: Secondary | ICD-10-CM

## 2015-07-10 DIAGNOSIS — J189 Pneumonia, unspecified organism: Secondary | ICD-10-CM | POA: Diagnosis not present

## 2015-07-10 DIAGNOSIS — I161 Hypertensive emergency: Secondary | ICD-10-CM

## 2015-07-10 LAB — PROTIME-INR
INR: 2.39 — AB (ref 0.00–1.49)
Prothrombin Time: 25.8 seconds — ABNORMAL HIGH (ref 11.6–15.2)

## 2015-07-10 LAB — GLUCOSE, CAPILLARY
GLUCOSE-CAPILLARY: 142 mg/dL — AB (ref 65–99)
GLUCOSE-CAPILLARY: 176 mg/dL — AB (ref 65–99)
Glucose-Capillary: 108 mg/dL — ABNORMAL HIGH (ref 65–99)

## 2015-07-10 LAB — CBC
HEMATOCRIT: 34.3 % — AB (ref 36.0–46.0)
HEMOGLOBIN: 10.6 g/dL — AB (ref 12.0–15.0)
MCH: 27.5 pg (ref 26.0–34.0)
MCHC: 30.9 g/dL (ref 30.0–36.0)
MCV: 88.9 fL (ref 78.0–100.0)
Platelets: 168 10*3/uL (ref 150–400)
RBC: 3.86 MIL/uL — ABNORMAL LOW (ref 3.87–5.11)
RDW: 15.4 % (ref 11.5–15.5)
WBC: 5.7 10*3/uL (ref 4.0–10.5)

## 2015-07-10 MED ORDER — WARFARIN SODIUM 2 MG PO TABS
2.0000 mg | ORAL_TABLET | Freq: Once | ORAL | Status: AC
  Administered 2015-07-10: 2 mg via ORAL
  Filled 2015-07-10: qty 1

## 2015-07-10 MED ORDER — CLONIDINE HCL 0.3 MG/24HR TD PTWK
0.3000 mg | MEDICATED_PATCH | TRANSDERMAL | Status: DC
  Administered 2015-07-10: 0.3 mg via TRANSDERMAL
  Filled 2015-07-10: qty 1

## 2015-07-10 MED ORDER — ENSURE ENLIVE PO LIQD
237.0000 mL | Freq: Two times a day (BID) | ORAL | Status: DC
Start: 2015-07-10 — End: 2015-07-11

## 2015-07-10 MED ORDER — HYDRALAZINE HCL 50 MG PO TABS
50.0000 mg | ORAL_TABLET | Freq: Three times a day (TID) | ORAL | Status: DC
  Administered 2015-07-10 (×2): 50 mg via ORAL
  Filled 2015-07-10 (×2): qty 1

## 2015-07-10 MED ORDER — AMOXICILLIN-POT CLAVULANATE 875-125 MG PO TABS: 1.0000 | ORAL_TABLET | Freq: Two times a day (BID) | ORAL | Status: AC

## 2015-07-10 MED ORDER — RESOURCE THICKENUP CLEAR PO POWD
1.0000 | ORAL | Status: DC | PRN
Start: 2015-07-10 — End: 2015-07-11

## 2015-07-10 MED ORDER — INFLUENZA VAC SPLIT QUAD 0.5 ML IM SUSY
0.5000 mL | PREFILLED_SYRINGE | Freq: Once | INTRAMUSCULAR | Status: AC
  Administered 2015-07-10: 0.5 mL via INTRAMUSCULAR

## 2015-07-10 MED ORDER — INFLUENZA VAC SPLIT QUAD 0.5 ML IM SUSY
0.5000 mL | PREFILLED_SYRINGE | INTRAMUSCULAR | Status: DC
Start: 2015-07-11 — End: 2015-07-10

## 2015-07-10 NOTE — Progress Notes (Signed)
Physical Therapy Treatment Patient Details Name: Lindsay Sheppard MRN: [id number] DOB: 12-Mar-1921 Today's Date: 07/10/2015    History of Present Illness Pt. admitted 07/06/15 with acute encephalopathy, nausea, dry heaves, abdominal pain.  Found to have hypertensive emergency, PNA (CAP vs aspiration).  Please see H&P for extensive past history    PT Comments    Good effort and progress towards functional goals today. Tolerated transfer and gait training well. Easily fatigued but motivated, ambulating up to 15 feet for the first time since admission. SpO2 98% on room air, BP 162/66.  Patient will continue to benefit from skilled physical therapy services to further improve independence with functional mobility.   Follow Up Recommendations  SNF;Supervision/Assistance - 24 hour     Equipment Recommendations  None recommended by PT    Recommendations for Other Services       Precautions / Restrictions Precautions Precautions: Fall Restrictions Weight Bearing Restrictions: No    Mobility  Bed Mobility Overal bed mobility: Needs Assistance Bed Mobility: Supine to Sit;Sit to Supine     Supine to sit: Min guard Sit to supine: Min assist   General bed mobility comments: Min guard for safety to transition to seated position. Requires extra time. Cues to use hand rails as needed. Able to scoot to EOB without physical assist. Min assist for LE support back into bed.  Transfers Overall transfer level: Needs assistance Equipment used: Rolling walker (2 wheeled) Transfers: Sit to/from Stand Sit to Stand: Mod assist         General transfer comment: Mod assist for boost and balance leaning towards posterior. Performed x3 progressing to min assist to stand from bed. VC for hand placement and to lean anteriorly for improved balance.  Ambulation/Gait Ambulation/Gait assistance: Min assist Ambulation Distance (Feet): 15 Feet Assistive device: Rolling walker (2 wheeled) Gait  Pattern/deviations: Step-through pattern;Decreased stride length;Trunk flexed Gait velocity: decreased Gait velocity interpretation: <1.8 ft/sec, indicative of risk for recurrent falls General Gait Details: Min assist for walker control and balance due to LOB to posterior. VC for upright posture and walker placement for proximity. No buckling noted. Pt fatigues quickly with moderate dyspnea but VSS.   Stairs            Wheelchair Mobility    Modified Rankin (Stroke Patients Only)       Balance Overall balance assessment: Needs assistance Sitting-balance support: No upper extremity supported;Feet supported Sitting balance-Leahy Scale: Fair     Standing balance support: Bilateral upper extremity supported Standing balance-Leahy Scale: Poor                      Cognition Arousal/Alertness: Awake/alert Behavior During Therapy: WFL for tasks assessed/performed Overall Cognitive Status: Within Functional Limits for tasks assessed                      Exercises General Exercises - Lower Extremity Ankle Circles/Pumps: AROM;Both;10 reps;Seated Long Arc Quad: Strengthening;Both;10 reps;Seated Hip Flexion/Marching: Strengthening;Both;10 reps;Seated    General Comments General comments (skin integrity, edema, etc.): Vitals sitting EOB: BP 162/66, HR 88, SpO2 98% on room air.      Pertinent Vitals/Pain Pain Assessment: No/denies pain    Home Living                      Prior Function            PT Goals (current goals can now be found in the care plan section) Acute Rehab  PT Goals Patient Stated Goal: Daughter states snf PT Goal Formulation: With patient/family Time For Goal Achievement: 07/21/15 Potential to Achieve Goals: Fair Progress towards PT goals: Progressing toward goals    Frequency  Min 3X/week    PT Plan Current plan remains appropriate    Co-evaluation             End of Session   Activity Tolerance: Patient  tolerated treatment well Patient left: in bed;with call bell/phone within reach;with bed alarm set;with family/visitor present (daughter [name] present)     Time: 9147-8295 PT Time Calculation (min) (ACUTE ONLY): 28 min  Charges:  $Therapeutic Exercise: 8-22 mins $Therapeutic Activity: 8-22 mins                    G Codes:      Ellouise Newer Jul 13, 2015, 3:44 PM  Camille Bal [place], [place]

## 2015-07-10 NOTE — Clinical Social Work Placement (Addendum)
   CLINICAL SOCIAL WORK PLACEMENT  NOTE 07/10/15 - DISCHARGED TO [facility]  Date:  07/10/2015  Patient Details  Name: Lindsay Sheppard MRN: [id number] Date of Birth: 09/30/1920  Clinical Social Work is seeking post-discharge placement for this patient at the [place] level of care (*CSW will initial, date and re-position this form in  chart as items are completed):  Yes   Patient/family provided with [facility] Work Department's list of facilities offering this level of care within the geographic area requested by the patient (or if unable, by the patient's family).  Yes   Patient/family informed of their freedom to choose among providers that offer the needed level of care, that participate in Medicare, Medicaid or managed care program needed by the patient, have an available bed and are willing to accept the patient.  Yes   Patient/family informed of [facility]'s ownership interest in [facility] and [facility], as well as of the fact that they are under no obligation to receive care at these facilities.  PASRR submitted to EDS on       PASRR number received on       Existing PASRR number confirmed on  07/09/15     FL2 transmitted to all facilities in geographic area requested by pt/family on  07/09/15     FL2 transmitted to all facilities within larger geographic area on       Patient informed that his/her managed care company has contracts with or will negotiate with certain facilities, including the following:         07/09/15 - Patient/family informed of bed offers received.  Patient [facility] bed at  [facility]     Physician recommends and patient chooses bed at      Patient to be transferred to  [facility] on  07/10/15.  Patient to be transferred to facility by  ambulance     Patient family notified on  07/10/15 of transfer.  Name of family member notified:   Daughter, Ivin Booty May at the bedside    PHYSICIAN Please sign FL2      Additional Comment:    _______________________________________________ Sable Feil, LCSW 07/10/2015, 3:50 PM

## 2015-07-10 NOTE — Progress Notes (Signed)
ANTICOAGULATION CONSULT NOTE - [facility] for warfarin Indication: History of DVT/PE  Allergies  Allergen Reactions  . Amiodarone Other (See Comments)    REACTION: f? fluid retention  . Deltasone [Prednisone] Other (See Comments)    unknown  . Diltiazem Hcl Other (See Comments)    unknown  . Levofloxacin Nausea And Vomiting    REACTION: nausea  . Pregabalin Other (See Comments)    REACTION: hallucinations  . Spironolactone Other (See Comments)    REACTION: elevated K at lowest dose  . Tequin [Gatifloxacin] Other (See Comments)    Hypoglycemia  . Diazepam Anxiety and Other (See Comments)    REACTION: agitation    Vital Signs: Temp: 97.8 F (36.6 C) (11/08 0828) Temp Source: Oral (11/08 0828) BP: 165/57 mmHg (11/08 0828) Pulse Rate: 80 (11/08 0828)  Labs:  Recent Labs  07/08/15 0244 07/09/15 0406 07/10/15 0524  HGB 10.7* 10.7* 10.6*  HCT 34.3* 34.6* 34.3*  PLT 163 159 168  LABPROT 36.4* 31.8* 25.8*  INR 3.78* 3.16* 2.39*  CREATININE 1.35* 1.21*  --     Estimated Creatinine Clearance: 26.7 mL/min (by C-G formula based on Cr of 1.21).   Medical History: Past Medical History  Diagnosis Date  . POSTHERPETIC NEURALGIA 11/02/2009  . B12 DEFICIENCY 05/23/2007  . HYPERLIPIDEMIA 05/23/2007  . HYPERKALEMIA 05/30/2010  . ANEMIA-IRON DEFICIENCY 01/26/2007  . BLEPHARITIS, BILATERAL 02/23/2008  . Other specified forms of hearing loss 05/30/2010  . HYPERTENSION 01/26/2007  . Atrial fibrillation ([place]) 05/23/2007  . DIASTOLIC HEART FAILURE, CHRONIC 12/14/2009  . POSTURAL HYPOTENSION 02/07/2008  . ALLERGIC RHINITIS 11/02/2009  . GERD 01/26/2007  . CONSTIPATION 01/25/2008  . SKIN LESION 05/30/2010  . BACK PAIN 08/02/2007  . OSTEOPOROSIS 05/23/2007  . SYNCOPE 02/07/2008  . FATIGUE 09/06/2007  . Dysuria 09/05/2008  . Abdominal pain, epigastric 09/06/2007  . EPIGASTRIC TENDERNESS 10/12/2007  . PULMONARY EMBOLISM, HX OF 05/23/2007  . SHINGLES, HX OF 05/23/2007  . 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 Left foot pain   . Diabetic neuropathy ([place])   . RENAL INSUFFICIENCY 07/02/2009  . RENAL CYST, LEFT 09/06/2007  . Kidney stones     "never had OR"  . Family history of anesthesia complication     "daughter has PONV; another daughter a little anesthesia lasts way too long"  . CHF (congestive heart failure) ([place]) 2011  . Myocardial infarction [facility]) 2011    "mild"  . DVT (deep venous thrombosis) ([place]) ~ 1964    "think it was in the left"  . Aspiration pneumonia ([place]) ~ 2006    "due to aspiration"  . DIABETES MELLITUS, TYPE II 09/06/2007  . DEPRESSIVE DISORDER 01/25/2008    "@ times; never treated for it"  . Melanoma of nose ([place])   . Compression fracture of lumbar vertebra ([facility])     hx  . Compression fracture of thoracic vertebra ([place]) 02/07/2014    "fell this am" (02/07/2014  . Arthritis     "hands" (03/15/2014)  . Foley catheter in place on admission     Assessment: 81 yo F presented to the ED on 11/4 for confusion and lethargy. Patient is on chronic coumadin at home for history of a PE and DVT. On coumadin 2mg  daily exc 4mg  on Thurs.   On admission 11/4 the INR was therapeutic at 2.52.  INR then  increased to 3.78 then > 3.16. Today INR is 2.39. INR increase was likely related to drug interaction with azithromycin and decreased PO  intake due to nausea.Coumadin dose was held on 11/6 then decreased to 1mg  on 11/7. CBC stable. No bleeding noted.   Of note, Coumadin dose missed 11/4 due to N/V. Given 11/5. Held 11/6.  PTA Coumadin 2mg  daily exc 4mg  on Thurs.  Goal of Therapy:  INR 2-3 Monitor platelets by anticoagulation protocol: Yes   Plan:  Give coumadin 2mg  today. Monitor daily INR, CBC, s/s of bleed   Nicole Cella, RPh Clinical Pharmacist Pager: [phone] 07/10/2015 4:30 PM

## 2015-07-10 NOTE — Discharge Summary (Signed)
Physician Discharge Summary  Lindsay Sheppard:[id number] DOB: Apr 22, 1921 DOA: 07/06/2015  PCP: [name], MD  Admit date: 07/06/2015 Discharge date: 07/10/2015  Time spent: 35 minutes  Recommendations for Outpatient Follow-up:  #1.Discharged to skilled nursing facility. Patient will complete a total 7 days of antibiotics (Augmentin) on 11/10 #2.  Please add stool softner if not having regular BM on bid miralax.( patient gets abdominal discomfort and nausea when not having regular BMs)  Discharge Diagnoses:  Principal Problem: Acute encephaloapthy   Active Problems:  Hypertensive urgency   Protein-calorie malnutrition, severe (HCC)   UTI (lower urinary tract infection)   CAP (community acquired pneumonia)   RLQ abdominal pain   Lethargy   Chronic indwelling Foley catheter   Chronic anticoagulation   Acute kidney injury ([place])  CODE STATUS: DO NOT RESUSCITATE  Discharge Condition: Fair  Diet recommendation: Dysphagia level II (fine chopped) with nectar thick liquid  Filed Weights   07/07/15 2038 07/08/15 2119 07/09/15 2100  Weight: 58.2 kg (128 lb 4.9 oz) 60.8 kg (134 lb 0.6 oz) 61 kg (134 lb 7.7 oz)    History of present illness:  Please refer to admission H&P for details, in brief, 81 year old female with history of hypertension, type 2 diabetes mellitus, history of DVT and PE on chronic Coumadin, chronic Foley catheter, recently diagnosed UTI for which she presented to the ED and discharged on Keflex return to the ED after having nausea and writing with right lower quadrant abdominal pain. She was also lethargic and encephalopathic. In the ED UA was positive for UTI and chest x-ray showed left base opacity. CT head was unremarkable. She also had hypertensive urgency. Patient placed on empiric antibiotics. Foley catheter was change in the ED. Cultures sent and admitted to medical floor.  Hospital Course:  Acute encephalopathy Possibly associated with UTI, dehydration  and community acquired pneumonia. On empiric Rocephin and azithromycin. . Encephalopathy resolved and patient now oriented to baseline.. Blood cultures negative. Urine cultures with multiple species. Seen by speech and swallow and recommended dysphagia level II diet with nectar thick liquid. She will be discharged on oral Augmentin to complete a total 7 day course of antibiotic.  Right lower quadrant abdominal pain with nausea Secondary to constipation which improved with bowel movement.. continue with stool softeners and when necessary antiemetics.. Patient also has a 5.84.6 cm mass in right: Which was found on CT scan in 09/2014. Family had opted not to work this up.  I will resume her home dose of twice a day MiraLAX. If patient is not having daily bowel movement please add a stool softener.  UTI and community-acquired pneumonia As above. Has a chronic Foley which was changed in the ED. blood cultures negative. Urine cultures with mixed species.  accelerated Hypertension/hypertensive urgency Likely contributed by primary symptoms. Home dose of labetalol, hydralazine and clonidine has been resumed. Blood pressure stable.   Type 2 diabetes mellitus with neuropathy Controlled. Continue home dose insulin.  Acute kidney injury Likely secondary to dehydration. Received IV hydration.   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  Iron deficiency anemia Continue supplement  GERD Continue PPI  Protein calorie malnutrition Added supplement     Family Communication: Daughter at bedside Disposition Plan: skilled nursing facility   Consultants:  None  Procedures:  Head CT  CT abdomen and pelvis  Antibiotics:  IV Rocephin and azithromycin    Discharge Exam: Filed Vitals:   07/10/15 0828  BP: 165/57  Pulse: 80  Temp: 97.8 F (36.6  C)  Resp: 16    General: Elderly female not in distress  HEENT: Moist Mucosa, supple neck  Chest: Clear to auscultation  bilaterally  CVS: Normal S1 and S2, no murmurs  GI: Soft, nondistended, nontender bowel sounds present, has chronic foley  Musculoskeletal: Warm, no edema  CNS: Awake and alert, oriented at baseline  Discharge Instructions    Current Discharge Medication List    START taking these medications   Details  feeding supplement, ENSURE ENLIVE, (ENSURE ENLIVE) LIQD Take 237 mLs by mouth 2 (two) times daily between meals. Qty: 237 mL, Refills: 12    Maltodextrin-Xanthan Gum (RESOURCE THICKENUP CLEAR) POWD Take 120 g by mouth as needed. Qty: 30 Can, Refills: 0      CONTINUE these medications which have CHANGED   Details  amoxicillin-clavulanate (AUGMENTIN) 875-125 MG tablet Take 1 tablet by mouth 2 (two) times daily. Qty: 6 tablet, Refills: 0      CONTINUE these medications which have NOT CHANGED   Details  acetaminophen (TYLENOL) 325 MG tablet Take 2 tablets (650 mg total) by mouth 3 (three) times daily.    cloNIDine (CATAPRES - DOSED IN MG/24 HR) 0.3 mg/24hr patch Place 0.3 mg onto the skin once a week.    CRANBERRY PO Take 500 mg by mouth daily.     cyanocobalamin (,VITAMIN B-12,) 1000 MCG/ML injection INJECT 1 ML INTO THE MUSCLE EVERY 30 DAYS Qty: 1 mL, Refills: 11    donepezil (ARICEPT) 5 MG tablet Take 1 tablet (5 mg total) by mouth at bedtime. Qty: 90 tablet, Refills: 3    ferrous sulfate 325 (65 FE) MG tablet Take 1 tablet (325 mg total) by mouth 2 (two) times daily with a meal. Qty: 60 tablet, Refills: 1    hydrALAZINE (APRESOLINE) 25 MG tablet Take 1 tablet (25 mg total) by mouth 3 (three) times daily. Qty: 270 tablet, Refills: 3    insulin glargine (LANTUS) 100 UNIT/ML injection Inject 0.2 mLs (20 Units total) into the skin daily. Qty: 30 mL, Refills: 3    labetalol (NORMODYNE) 200 MG tablet Take 400 mg by mouth 2 (two) times daily.    pantoprazole (PROTONIX) 40 MG tablet TAKE 1 BY MOUTH DAILY Qty: 90 tablet, Refills: 3    polyethylene glycol (MIRALAX /  GLYCOLAX) packet Take 17 g by mouth 2 (two) times daily. Qty: 14 each, Refills: 0    pravastatin (PRAVACHOL) 40 MG tablet TAKE 1/2 BY MOUTH DAILY Qty: 45 tablet, Refills: 3    warfarin (COUMADIN) 2 MG tablet Take as directed by anticoagulation clinic Qty: 120 tablet, Refills: 3   Associated Diagnoses: Anticoagulated on Coumadin    ACCU-CHEK COMPACT PLUS test strip Refills: 1    B-D INS SYRINGE 0.5CC/31GX5/16 31G X 5/16" 0.5 ML MISC Refills: 1    furosemide (LASIX) 20 MG tablet 1 tab by mouth per day as needed for swelling Qty: 90 tablet, Refills: 3    Hypromellose (ARTIFICIAL TEARS OP) Place 1 drop into both eyes daily as needed. For dry eyes      STOP taking these medications     fluticasone (FLONASE) 50 MCG/ACT nasal spray        Allergies  Allergen Reactions  . Amiodarone Other (See Comments)    REACTION: f? fluid retention  . Deltasone [Prednisone] Other (See Comments)    unknown  . Diltiazem Hcl Other (See Comments)    unknown  . Levofloxacin Nausea And Vomiting    REACTION: nausea  . Pregabalin Other (See  Comments)    REACTION: hallucinations  . Spironolactone Other (See Comments)    REACTION: elevated K at lowest dose  . Tequin [Gatifloxacin] Other (See Comments)    Hypoglycemia  . Diazepam Anxiety and Other (See Comments)    REACTION: agitation   Follow-up Information    Please follow up.   Why:  MD at SNF       The results of significant diagnostics from this hospitalization (including imaging, microbiology, ancillary and laboratory) are listed below for reference.    Significant Diagnostic Studies: Dg Chest 2 View  07/06/2015  CLINICAL DATA:  Altered mental status EXAM: CHEST  2 VIEW COMPARISON:  September 04, 2014 FINDINGS: There is patchy airspace consolidation in the left base with minimal left effusion. Lungs elsewhere clear. Heart size and pulmonary vascularity are normal. No adenopathy. There is atherosclerotic change in aorta. There is anterior  wedging of several thoracic vertebral bodies. IMPRESSION: Left base airspace consolidation. Followup PA and lateral chest radiographs recommended in 3-4 weeks following trial of antibiotic therapy to ensure resolution and exclude underlying malignancy. Electronically Signed   By: Lowella Grip III M.D.   On: 07/06/2015 12:57   Ct Head Wo Contrast  07/06/2015  CLINICAL DATA:  Increased fatigue, lethargy and confusion EXAM: CT HEAD WITHOUT CONTRAST TECHNIQUE: Contiguous axial images were obtained from the base of the skull through the vertex without contrast. COMPARISON:  08/27/2014 . FINDINGS: stable diffuse brain atrophy pattern and chronic periventricular white matter microvascular ischemic change. No acute intracranial hemorrhage, mass lesion, definite infarction, midline shift, herniation, hydrocephalus, or extra-axial fluid collection. No focal mass effect or edema. Mild cerebellar atrophy as well. Orbits are symmetric. Sinuses and mastoids remain clear. Skull appears intact IMPRESSION: Stable atrophy pattern and minor periventricular white matter microvascular ischemic change. No interval change or acute process by noncontrast CT. Electronically Signed   By: Jerilynn Mages.  Shick M.D.   On: 07/06/2015 12:56   Dg Abd Portable 2v  07/06/2015  CLINICAL DATA:  Right lower quadrant abdominal pain and vomiting today EXAM: PORTABLE ABDOMEN - 2 VIEW COMPARISON:  09/04/2014 CT abdomen/pelvis FINDINGS: No disproportionately dilated small bowel loops or fluid levels to suggest small bowel obstruction. Mild diffuse gaseous distention of the stool filled colon and rectum. No evidence of pneumatosis or pneumoperitoneum. Mild patchy opacity at the left lung base. Marked degenerative changes throughout the visualized thoracolumbar spine and atherosclerotic calcifications throughout the abdomen and pelvis. IMPRESSION: Nonobstructive bowel gas pattern.  No free air. Patchy left lung base opacity, correlate with chest radiograph.  Electronically Signed   By: Ilona Sorrel M.D.   On: 07/06/2015 20:44   Dg Swallowing Func-speech Pathology  07/09/2015  Objective Swallowing Evaluation:   MBS Patient Details Name: ARVELLA MASSINGALE MRN: [id number] Date of Birth: 04-16-1921 Today's Date: 07/09/2015 Time: SLP Start Time (ACUTE ONLY): 1350-SLP Stop Time (ACUTE ONLY): 1410 SLP Time Calculation (min) (ACUTE ONLY): 20 min Past Medical History: Past Medical History Diagnosis Date . POSTHERPETIC NEURALGIA 11/02/2009 . B12 DEFICIENCY 05/23/2007 . HYPERLIPIDEMIA 05/23/2007 . HYPERKALEMIA 05/30/2010 . ANEMIA-IRON DEFICIENCY 01/26/2007 . BLEPHARITIS, BILATERAL 02/23/2008 . Other specified forms of hearing loss 05/30/2010 . HYPERTENSION 01/26/2007 . Atrial fibrillation ([place]) 05/23/2007 . DIASTOLIC HEART FAILURE, CHRONIC 12/14/2009 . POSTURAL HYPOTENSION 02/07/2008 . ALLERGIC RHINITIS 11/02/2009 . GERD 01/26/2007 . CONSTIPATION 01/25/2008 . SKIN LESION 05/30/2010 . BACK PAIN 08/02/2007 . OSTEOPOROSIS 05/23/2007 . SYNCOPE 02/07/2008 . FATIGUE 09/06/2007 . Dysuria 09/05/2008 . Abdominal pain, epigastric 09/06/2007 . EPIGASTRIC TENDERNESS 10/12/2007 . PULMONARY EMBOLISM, HX OF  05/23/2007 . SHINGLES, HX OF 05/23/2007 . 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 Left foot pain  . Diabetic neuropathy ([place])  . RENAL INSUFFICIENCY 07/02/2009 . RENAL CYST, LEFT 09/06/2007 . Kidney stones    "never had OR" . Family history of anesthesia complication    "daughter has PONV; another daughter a little anesthesia lasts way too long" . CHF (congestive heart failure) ([place]) 2011 . Myocardial infarction [facility]) 2011   "mild" . DVT (deep venous thrombosis) ([place]) ~ 1964   "think it was in the left" . Aspiration pneumonia ([place]) ~ 2006   "due to aspiration" . DIABETES MELLITUS, TYPE II 09/06/2007 . DEPRESSIVE DISORDER 01/25/2008   "@ times; never treated for it" . Melanoma of nose ([place])  . Compression fracture of lumbar vertebra (HCC)    hx . Compression fracture of thoracic vertebra ([place]) 02/07/2014   "fell this am" (02/07/2014 .  Arthritis    "hands" (03/15/2014) . Foley catheter in place on admission  Past Surgical History: Past Surgical History Procedure Laterality Date . Appendectomy  1943 . Abdominal hysterectomy  1964 . Cardiac electrophysiology mapping and ablation  1999 . Cataract extraction w/ intraocular lens  implant, bilateral  2003-2005 . Carpal tunnel release Right 2004 . Mohs surgery  2011   "removed off her nose" . Cardiac catheterization  10/2009 . Esophageal dilation  X 2 HPI: Other Pertinent Information: Pt is a 81 year old female with complex PMH including dementia, recently diagnosed with UTI and treated with keflex 5 days ago, who presents with lethargy and increased confusion per the patient's family. CT of head negative for acute intracranial abnormalities. CT of abdomen significant for patchy left lung base opacity.  No Data Recorded Assessment / Plan / Recommendation CHL IP CLINICAL IMPRESSIONS 07/09/2015 Therapy Diagnosis (None) Clinical Impression Decreased labial seal and control resulted in right anterior spillage; prolonged manipulation and transit with cracker (no upper dentition).  Mild motor based pharyngeal dysphagia, decreased laryngeal closure with silent penetration with nectar x 1 and silent aspiration with thin (chin tuck strategy ineffective). Mild vallecular and pyriform sinus residue reduced with verbal cues for second swallow. Decreased oral control if larger sip consumed increases aspiration risk. Recommend Dys 2 diet and nectar thick liquids, no straws and sit upright.      CHL IP TREATMENT RECOMMENDATION 07/07/2015 Treatment Recommendations Therapy as outlined in treatment plan below   CHL IP DIET RECOMMENDATION 07/09/2015 SLP Diet Recommendations (None) Liquid Administration via (None) Medication Administration Crushed with puree Compensations Slow rate;Small sips/bites Postural Changes and/or Swallow Maneuvers Seated upright 90 degrees       CHL IP FREQUENCY AND DURATION 07/07/2015 Speech Therapy  Frequency (ACUTE ONLY) min 2x/week Treatment Duration 2 weeks   Pertinent Vitals/Pain none  SLP Swallow Goals No flowsheet data found. No flowsheet data found.   CHL IP REASON FOR REFERRAL 07/09/2015 Reason for Referral Objectively evaluate swallowing function       No flowsheet data found.   Houston Siren 07/09/2015, 3:17 PM Orbie Pyo Colvin Caroli.Ed [employer] [phone]    Microbiology: Recent Results (from the past 240 hour(s))  Urine culture     Status: None   Collection Time: 07/01/15  2:50 PM  Result Value Ref Range Status   Specimen Description URINE, RANDOM  Final   Special Requests NONE  Final   Culture MULTIPLE SPECIES PRESENT, SUGGEST RECOLLECTION  Final   Report Status 07/02/2015 FINAL  Final  Culture, Urine     Status: None   Collection Time:  07/06/15  1:00 PM  Result Value Ref Range Status   Specimen Description URINE, RANDOM  Final   Special Requests NONE  Final   Culture MULTIPLE SPECIES PRESENT, SUGGEST RECOLLECTION  Final   Report Status 07/08/2015 FINAL  Final  Blood culture (routine x 2)     Status: None (Preliminary result)   Collection Time: 07/06/15  2:00 PM  Result Value Ref Range Status   Specimen Description BLOOD LEFT ARM  Final   Special Requests BOTTLES DRAWN AEROBIC AND ANAEROBIC 5CC  Final   Culture NO GROWTH 3 DAYS  Final   Report Status PENDING  Incomplete  Blood culture (routine x 2)     Status: None (Preliminary result)   Collection Time: 07/06/15  2:05 PM  Result Value Ref Range Status   Specimen Description BLOOD RIGHT HAND  Final   Special Requests BOTTLES DRAWN AEROBIC AND ANAEROBIC 5CC  Final   Culture NO GROWTH 3 DAYS  Final   Report Status PENDING  Incomplete     Labs: Basic Metabolic Panel:  Recent Labs Lab 07/06/15 1220 07/07/15 0539 07/08/15 0244 07/09/15 0406  NA 138 139 138 140  K 4.2 4.2 4.2 4.1  CL 109 105 105 108  CO2 21* 25 25 24   GLUCOSE 126* 73 223* 133*  BUN 23* 22* 29* 25*  CREATININE 0.98 1.24* 1.35* 1.21*   CALCIUM 9.1 9.3 9.0 9.0   Liver Function Tests:  Recent Labs Lab 07/06/15 1220  AST 22  ALT 20  ALKPHOS 55  BILITOT 0.6  PROT 5.0*  ALBUMIN 3.1*   No results for input(s): LIPASE, AMYLASE in the last 168 hours. No results for input(s): AMMONIA in the last 168 hours. CBC:  Recent Labs Lab 07/06/15 1220 07/07/15 0539 07/08/15 0244 07/09/15 0406 07/10/15 0524  WBC 4.8 6.7 5.7 5.7 5.7  NEUTROABS 3.7  --   --   --   --   HGB 11.5* 11.8* 10.7* 10.7* 10.6*  HCT 35.6* 37.0 34.3* 34.6* 34.3*  MCV 87.5 88.3 89.3 89.4 88.9  PLT PLATELETS APPEAR ADEQUATE 191 163 159 168   Cardiac Enzymes: No results for input(s): CKTOTAL, CKMB, CKMBINDEX, TROPONINI in the last 168 hours. BNP: BNP (last 3 results) No results for input(s): BNP in the last 8760 hours.  ProBNP (last 3 results) No results for input(s): PROBNP in the last 8760 hours.  CBG:  Recent Labs Lab 07/07/15 1206 07/07/15 1703 07/09/15 1720 07/09/15 2154 07/10/15 0827  GLUCAP 114* 190* 236* 139* 108*       Signed:  Modest Draeger  [facility] 07/10/2015, 11:29 AM

## 2015-07-10 NOTE — Progress Notes (Signed)
Speech Language Pathology Treatment: Dysphagia  Patient Details Name: Lindsay Sheppard MRN: [id number] DOB: June 24, 1921 Today's Date: 07/10/2015 Time: 0539-7673 SLP Time Calculation (min) (ACUTE ONLY): 10 min  Assessment / Plan / Recommendation Clinical Impression  SLP reviewed results of MBS with pt's other daughter (with pt's permission) and provided education for thickening liquids. Family familiar with pt's CVA 10 years ago. No indications of oral or pharyngeal difficulty with nectar thick liquid. Min verbal reminders for small sips. Possible discharge today to SNF. Continue ST, Dys 2 (minced/ground) texture and nectar thick liquids.   HPI Other Pertinent Information: Pt is a 81 year old female with complex PMH including dementia, recently diagnosed with UTI and treated with keflex 5 days ago, who presents with lethargy and increased confusion per the patient's family. CT of head negative for acute intracranial abnormalities. CT of abdomen significant for patchy left lung base opacity.    Pertinent Vitals Pain Assessment: No/denies pain  SLP Plan  Continue with current plan of care    Recommendations Diet recommendations: Dysphagia 2 (fine chop);Nectar-thick liquid Liquids provided via: Cup;No straw Medication Administration: Crushed with puree Supervision: Patient able to self feed;Full supervision/cueing for compensatory strategies Compensations: Slow rate;Small sips/bites;Multiple dry swallows after each bite/sip Postural Changes and/or Swallow Maneuvers: Seated upright 90 degrees              Oral Care Recommendations: Oral care BID Follow up Recommendations: Skilled Nursing facility Plan: Continue with current plan of care    GO     Houston Siren 07/10/2015, 11:50 AM  Orbie Pyo Colvin Caroli[employer]Ed [employer] [phone]

## 2015-07-10 NOTE — Consult Note (Signed)
   [facility] CM Inpatient Consult   07/10/2015  MIKELE SIFUENTES December 09, 1920 [id number] Patient is currently active with [facility] for chronic disease management services.  Patient has been engaged by a Research scientist (medical).  Our community based plan of care has focused on disease management and community resource support.  Patient will receive a post discharge transition of care call and will be evaluated for monthly home visits for assessments and disease process education. Met with patient and daughter Ivin Booty at bedside. Patient is hopeful to going to a skilled nursing facility for short term rehab.  Consent already in [facility].  [facility] will transition patient from Health Coach to our transition program to follow up for plans to return home with family.  Made Inpatient Case Manager aware that [facility] following. Of note, [facility] services does not replace or interfere with any services that are arranged by inpatient case management or social work.  For additional questions or referrals please contact: Natividad Brood, RN BSN [facility] Liaison  [phone] business mobile phone

## 2015-07-10 NOTE — Progress Notes (Signed)
OT Defer Note  Patient Details Name: Lindsay Sheppard MRN: [id number] DOB: 01-18-1921   Cancelled Treatment:    Reason Eval/Treat Not Completed: Other (comment) (defer SNF) Pt with pending d/c to snf today per notes. OT to defer all further treatment to SNF level .   Vonita Moss   OTR/L Pager: [phone] Office: [phone] .  07/10/2015, 11:35 AM

## 2015-07-11 ENCOUNTER — Non-Acute Institutional Stay (SKILLED_NURSING_FACILITY): Payer: Medicare Other | Admitting: Internal Medicine

## 2015-07-11 ENCOUNTER — Telehealth: Payer: Self-pay | Admitting: *Deleted

## 2015-07-11 ENCOUNTER — Encounter: Payer: Self-pay | Admitting: Internal Medicine

## 2015-07-11 DIAGNOSIS — F0390 Unspecified dementia without behavioral disturbance: Secondary | ICD-10-CM | POA: Diagnosis not present

## 2015-07-11 DIAGNOSIS — R0982 Postnasal drip: Secondary | ICD-10-CM | POA: Diagnosis not present

## 2015-07-11 DIAGNOSIS — E43 Unspecified severe protein-calorie malnutrition: Secondary | ICD-10-CM | POA: Diagnosis not present

## 2015-07-11 DIAGNOSIS — K219 Gastro-esophageal reflux disease without esophagitis: Secondary | ICD-10-CM

## 2015-07-11 DIAGNOSIS — I10 Essential (primary) hypertension: Secondary | ICD-10-CM | POA: Diagnosis not present

## 2015-07-11 DIAGNOSIS — I2699 Other pulmonary embolism without acute cor pulmonale: Secondary | ICD-10-CM | POA: Diagnosis not present

## 2015-07-11 DIAGNOSIS — N179 Acute kidney failure, unspecified: Secondary | ICD-10-CM

## 2015-07-11 DIAGNOSIS — K5900 Constipation, unspecified: Secondary | ICD-10-CM

## 2015-07-11 DIAGNOSIS — E119 Type 2 diabetes mellitus without complications: Secondary | ICD-10-CM

## 2015-07-11 DIAGNOSIS — Z466 Encounter for fitting and adjustment of urinary device: Secondary | ICD-10-CM

## 2015-07-11 DIAGNOSIS — N189 Chronic kidney disease, unspecified: Secondary | ICD-10-CM

## 2015-07-11 DIAGNOSIS — R5381 Other malaise: Secondary | ICD-10-CM | POA: Diagnosis not present

## 2015-07-11 DIAGNOSIS — R1310 Dysphagia, unspecified: Secondary | ICD-10-CM | POA: Diagnosis not present

## 2015-07-11 DIAGNOSIS — J189 Pneumonia, unspecified organism: Secondary | ICD-10-CM

## 2015-07-11 DIAGNOSIS — Z794 Long term (current) use of insulin: Secondary | ICD-10-CM

## 2015-07-11 DIAGNOSIS — N390 Urinary tract infection, site not specified: Secondary | ICD-10-CM

## 2015-07-11 LAB — CULTURE, BLOOD (ROUTINE X 2)
CULTURE: NO GROWTH
CULTURE: NO GROWTH

## 2015-07-11 NOTE — Patient Outreach (Signed)
[facility]) Care Management  07/11/2015  ZURII HEWES 1920-10-08 [id number]   Referral from Natividad Brood, RN to assign SW and [employer], assigned Eula Fried, LCSW and Kathie Rhodes, RN.  Thanks, [name]. Rachel, [place] Assistant Phone: [phone] Fax: [phone]

## 2015-07-11 NOTE — Telephone Encounter (Signed)
Pt was on TCM list d/c 07/10/15 sent to SNF...Lindsay Sheppard

## 2015-07-11 NOTE — Progress Notes (Signed)
Patient ID: Lindsay Sheppard, female   DOB: 12/04/1920, 82 y.o.   MRN: [id number]       [facility]  PCP: [name], MD    Code Status: Full Code  Allergies  Allergen Reactions  . Amiodarone Other (See Comments)    REACTION: f? fluid retention  . Deltasone [Prednisone] Other (See Comments)    unknown  . Diltiazem Hcl Other (See Comments)    unknown  . Levofloxacin Nausea And Vomiting    REACTION: nausea  . Pregabalin Other (See Comments)    REACTION: hallucinations  . Spironolactone Other (See Comments)    REACTION: elevated K at lowest dose  . Tequin [Gatifloxacin] Other (See Comments)    Hypoglycemia  . Diazepam Anxiety and Other (See Comments)    REACTION: agitation    Chief Complaint  Patient presents with  . New Admit To SNF    New Admission     HPI:  81 y.o. patient is here for short term rehabilitation post hospital admission from 07/06/15-07/10/15 with acute encephalopathy, hypertensive urgency, CAP and UTI along with acute kidney injury. She was started on iv fluids and antibiotics and later switched to po augmentin. She was seen by SLP team and started on dysphagia diet. She was noted to be constipated and started on her laxatives. She has PMH of HTN, DM, PE/DVT, Chronic foley catheter, gerd among others. She is seen in her room today with her daughter present. She is hard of hearing. She feels weak and tired and has not been out of bed today. Her daughter would like her home regimen flonase resumed.  Review of Systems:  Constitutional: Negative for fever, chills, diaphoresis.  HENT: Negative for headache. Positive for nasal congestion and post nasal drip. Eyes: Negative for eye pain, blurred vision, double vision and discharge.  Respiratory: Negative for cough, shortness of breath and wheezing.   Cardiovascular: Negative for chest pain, palpitations, leg swelling.  Gastrointestinal: Negative for heartburn, vomiting, abdominal pain. Positive for  occasional nausea and is nauseous at present. Ate her breakfast this am Genitourinary: has indwelling foley ctaheter Musculoskeletal: Negative for back pain, falls. Skin: Negative for itching, rash.  Neurological: Negative for tingling. Has occasional dizziness.  Psychiatric/Behavioral: Negative for depression. Has memory loss.   Past Medical History  Diagnosis Date  . POSTHERPETIC NEURALGIA 11/02/2009  . B12 DEFICIENCY 05/23/2007  . HYPERLIPIDEMIA 05/23/2007  . HYPERKALEMIA 05/30/2010  . ANEMIA-IRON DEFICIENCY 01/26/2007  . BLEPHARITIS, BILATERAL 02/23/2008  . Other specified forms of hearing loss 05/30/2010  . HYPERTENSION 01/26/2007  . Atrial fibrillation ([place]) 05/23/2007  . DIASTOLIC HEART FAILURE, CHRONIC 12/14/2009  . POSTURAL HYPOTENSION 02/07/2008  . ALLERGIC RHINITIS 11/02/2009  . GERD 01/26/2007  . CONSTIPATION 01/25/2008  . SKIN LESION 05/30/2010  . BACK PAIN 08/02/2007  . OSTEOPOROSIS 05/23/2007  . SYNCOPE 02/07/2008  . FATIGUE 09/06/2007  . Dysuria 09/05/2008  . Abdominal pain, epigastric 09/06/2007  . EPIGASTRIC TENDERNESS 10/12/2007  . PULMONARY EMBOLISM, HX OF 05/23/2007  . SHINGLES, HX OF 05/23/2007  . 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 Left foot pain   . Diabetic neuropathy ([place])   . RENAL INSUFFICIENCY 07/02/2009  . RENAL CYST, LEFT 09/06/2007  . Kidney stones     "never had OR"  . Family history of anesthesia complication     "daughter has PONV; another daughter a little anesthesia lasts way too long"  . CHF (congestive heart failure) ([place]) 2011  . Myocardial infarction [facility]) 2011    "mild"  .  DVT (deep venous thrombosis) ([place]) ~ 1964    "think it was in the left"  . Aspiration pneumonia ([place]) ~ 2006    "due to aspiration"  . DIABETES MELLITUS, TYPE II 09/06/2007  . DEPRESSIVE DISORDER 01/25/2008    "@ times; never treated for it"  . Melanoma of nose ([place])   . Compression fracture of lumbar vertebra ([facility])     hx  . Compression fracture of thoracic vertebra ([place]) 02/07/2014     "fell this am" (02/07/2014  . Arthritis     "hands" (03/15/2014)  . Foley catheter in place on admission    Past Surgical History  Procedure Laterality Date  . Appendectomy  1943  . Abdominal hysterectomy  1964  . Cardiac electrophysiology mapping and ablation  1999  . Cataract extraction w/ intraocular lens  implant, bilateral  2003-2005  . Carpal tunnel release Right 2004  . Mohs surgery  2011    "removed off her nose"  . Cardiac catheterization  10/2009  . Esophageal dilation  X 2   Social History:   reports that she has never smoked. She has never used smokeless tobacco. She reports that she does not drink alcohol or use illicit drugs.  Family History  Problem Relation Age of Onset  . Breast cancer Mother   . Colon cancer Father   . Stroke Father     died with stroke postop  . Diabetes Sister     2 sisters  . Hypertension Other     Medications:   Medication List       This list is accurate as of: 07/11/15  9:30 AM.  Always use your most recent med list.               ACCU-CHEK COMPACT PLUS test strip  Generic drug:  glucose blood     acetaminophen 325 MG tablet  Commonly known as:  TYLENOL  Take 2 tablets (650 mg total) by mouth 3 (three) times daily.     amoxicillin-clavulanate 875-125 MG tablet  Commonly known as:  AUGMENTIN  Take 1 tablet by mouth 2 (two) times daily.     ARTIFICIAL TEARS OP  Place 1 drop into both eyes daily as needed. For dry eyes     B-D INS SYRINGE 0.5CC/31GX5/16 31G X 5/16" 0.5 ML Misc  Generic drug:  Insulin Syringe-Needle U-100     cloNIDine 0.3 mg/24hr patch  Commonly known as:  CATAPRES - Dosed in mg/24 hr  Place 0.3 mg onto the skin once a week.     CRANBERRY PO  Take 500 mg by mouth daily.     cyanocobalamin 1000 MCG/ML injection  Commonly known as:  (VITAMIN B-12)  INJECT 1 ML INTO THE MUSCLE EVERY 30 DAYS     donepezil 5 MG tablet  Commonly known as:  ARICEPT  Take 1 tablet (5 mg total) by mouth at bedtime.      ferrous sulfate 325 (65 FE) MG tablet  Take 1 tablet (325 mg total) by mouth 2 (two) times daily with a meal.     fluticasone 50 MCG/ACT nasal spray  Commonly known as:  FLONASE  Place 2 sprays into the nose daily for allergies     furosemide 20 MG tablet  Commonly known as:  LASIX  1 tab by mouth per day as needed for swelling     hydrALAZINE 25 MG tablet  Commonly known as:  APRESOLINE  Take 1 tablet (25 mg total) by mouth 3 (  three) times daily.     insulin glargine 100 UNIT/ML injection  Commonly known as:  LANTUS  Inject 0.2 mLs (20 Units total) into the skin daily.     labetalol 200 MG tablet  Commonly known as:  NORMODYNE  Take 400 mg by mouth 2 (two) times daily.     pantoprazole 40 MG tablet  Commonly known as:  PROTONIX  TAKE 1 BY MOUTH DAILY     polyethylene glycol packet  Commonly known as:  MIRALAX / GLYCOLAX  Take 17 g by mouth 2 (two) times daily.     pravastatin 40 MG tablet  Commonly known as:  PRAVACHOL  TAKE 1/2 BY MOUTH DAILY     warfarin 2 MG tablet  Commonly known as:  COUMADIN  Take as directed by anticoagulation clinic         Physical Exam: Filed Vitals:   07/11/15 0856  BP: 155/70  Pulse: 94  Temp: 98.7 F (37.1 C)  TempSrc: Oral  Resp: 18  Height: 5\' 5"  (1.651 m)  Weight: 134 lb (60.782 kg)  SpO2: 95%    General- elderly female, thin built, frail, in no acute distress Head- normocephalic, atraumatic Nose- normal nasal mucosa, no maxillary or frontal sinus tenderness, no nasal discharge Throat- moist mucus membrane Eyes- PERRLA, EOMI, no pallor, no icterus, no discharge, normal conjunctiva, normal sclera Neck- no cervical lymphadenopathy Cardiovascular- normal s1,s2, no murmurs, palpable dorsalis pedis and radial pulses, no leg edema Respiratory- bilateral clear to auscultation, no wheeze, no rhonchi, no crackles, no use of accessory muscles Abdomen- bowel sounds present, soft, non tender Musculoskeletal- able to move all 4  extremities, generalized weakness  Neurological- no focal deficit, alert and oriented to person Skin- warm and dry   Labs reviewed: Basic Metabolic Panel:  Recent Labs  07/07/15 0539 07/08/15 0244 07/09/15 0406  NA 139 138 140  K 4.2 4.2 4.1  CL 105 105 108  CO2 25 25 24   GLUCOSE 73 223* 133*  BUN 22* 29* 25*  CREATININE 1.24* 1.35* 1.21*  CALCIUM 9.3 9.0 9.0   Liver Function Tests:  Recent Labs  09/05/14 0406 03/06/15 1028 07/06/15 1220  AST 23 16 22   ALT 22 14 20   ALKPHOS 59 51 55  BILITOT 0.4 0.4 0.6  PROT 5.4* 5.9* 5.0*  ALBUMIN 3.2* 3.5 3.1*   No results for input(s): LIPASE, AMYLASE in the last 8760 hours. No results for input(s): AMMONIA in the last 8760 hours. CBC:  Recent Labs  09/05/14 0406  03/06/15 1028  07/06/15 1220  07/08/15 0244 07/09/15 0406 07/10/15 0524  WBC 4.9  < > 4.4  < > 4.8  < > 5.7 5.7 5.7  NEUTROABS 3.5  --  3.3  --  3.7  --   --   --   --   HGB 12.5  < > 11.9*  < > 11.5*  < > 10.7* 10.7* 10.6*  HCT 38.4  < > 36.1  < > 35.6*  < > 34.3* 34.6* 34.3*  MCV 89.1  < > 82.3  < > 87.5  < > 89.3 89.4 88.9  PLT 172  < > 181.0  < > PLATELETS APPEAR ADEQUATE  < > 163 159 168  < > = values in this interval not displayed. Cardiac Enzymes:  Recent Labs  09/04/14 1832  TROPONINI <0.03   BNP: Invalid input(s): POCBNP CBG:  Recent Labs  07/10/15 0827 07/10/15 1209 07/10/15 1743  GLUCAP 108* 142* 176*  Radiological Exams: Dg Chest 2 View  07/06/2015  CLINICAL DATA:  Altered mental status EXAM: CHEST  2 VIEW COMPARISON:  September 04, 2014 FINDINGS: There is patchy airspace consolidation in the left base with minimal left effusion. Lungs elsewhere clear. Heart size and pulmonary vascularity are normal. No adenopathy. There is atherosclerotic change in aorta. There is anterior wedging of several thoracic vertebral bodies. IMPRESSION: Left base airspace consolidation. Followup PA and lateral chest radiographs recommended in 3-4 weeks  following trial of antibiotic therapy to ensure resolution and exclude underlying malignancy. Electronically Signed   By: Lowella Grip III M.D.   On: 07/06/2015 12:57   Ct Head Wo Contrast  07/06/2015  CLINICAL DATA:  Increased fatigue, lethargy and confusion EXAM: CT HEAD WITHOUT CONTRAST TECHNIQUE: Contiguous axial images were obtained from the base of the skull through the vertex without contrast. COMPARISON:  08/27/2014 . FINDINGS: stable diffuse brain atrophy pattern and chronic periventricular white matter microvascular ischemic change. No acute intracranial hemorrhage, mass lesion, definite infarction, midline shift, herniation, hydrocephalus, or extra-axial fluid collection. No focal mass effect or edema. Mild cerebellar atrophy as well. Orbits are symmetric. Sinuses and mastoids remain clear. Skull appears intact IMPRESSION: Stable atrophy pattern and minor periventricular white matter microvascular ischemic change. No interval change or acute process by noncontrast CT. Electronically Signed   By: Jerilynn Mages.  Shick M.D.   On: 07/06/2015 12:56   Dg Abd Portable 2v  07/06/2015  CLINICAL DATA:  Right lower quadrant abdominal pain and vomiting today EXAM: PORTABLE ABDOMEN - 2 VIEW COMPARISON:  09/04/2014 CT abdomen/pelvis FINDINGS: No disproportionately dilated small bowel loops or fluid levels to suggest small bowel obstruction. Mild diffuse gaseous distention of the stool filled colon and rectum. No evidence of pneumatosis or pneumoperitoneum. Mild patchy opacity at the left lung base. Marked degenerative changes throughout the visualized thoracolumbar spine and atherosclerotic calcifications throughout the abdomen and pelvis. IMPRESSION: Nonobstructive bowel gas pattern.  No free air. Patchy left lung base opacity, correlate with chest radiograph. Electronically Signed   By: Ilona Sorrel M.D.   On: 07/06/2015 20:44     Assessment/Plan  Physical deconditioning With generalized weakness. Will have  her work with physical therapy and occupational therapy team to help with gait training and muscle strengthening exercises.fall precautions. Skin care. Encourage to be out of bed.   CAP  Breathing currently stable. Continue and complete course of po augmentin on 07/12/15. Add incentive spirometer and encourage to use  UTI Continue and complete course of augmentin. Continue foley care and encourage hydration  Post nasal drip  With nasal congestion. Add flonase daily for now  Protein calorie malnutrition Monitor weight. Get dietary consult. Pressure ulcer prophylaxis  Anemia of chronic disease Continue ferrous sulfate 325 mg bid, monitor cbc   Acute on chronic renal failure Thought to be from hypovolemia. Monitor bmp. Encourage hydration  HTN Elevated SBP, per patient her normal SBP is in 150s. Asymptomatic. Monitor bp q shift. Continue hydralazine 25 mg tid with labetalol 400 mg bid and clonidine 0.3 mg weekly patch. Avoid overcorrection of BP given her dizziness.   Dementia No behavioral disturbance. Monitor clinically. Continue aricpet 5 mg daily. Assistance with ADLs.  Dysphagia Stable, continue dysphagia diet with aspiration precautions, SLP consult  PE and DVT/ afib Continue coumadin 2 mg daily, inr today 2.2, continue current regimen and check inr 07/16/15  DM Continue lantus 20 u daily and monitor cbg  gerd Continue protonix daily for now, monitor symptoms  Chronic constipation Continue  miralax bid for now   Goals of care: short term rehabilitation   Labs/tests ordered: cbc with diff, cmp  Family/ staff Communication: reviewed care plan with patient and nursing supervisor    Blanchie Serve, MD  [place] [phone] (Monday-Friday 8 am - 5 pm) [phone] (afterhours)

## 2015-07-12 ENCOUNTER — Other Ambulatory Visit: Payer: Self-pay | Admitting: Licensed Clinical Social Worker

## 2015-07-12 NOTE — Patient Outreach (Signed)
[facility]) Care Management  07/12/2015  CHUMY MOMENT 07-Jun-1921 [id number]   Assessment-CSW completed outreach to patient's daughter Luanna Salk on 07/12/15 after receiving referral. [name] answered and provided HIPPA verifications. CSW introduced self, reason for call and of [place]. Patient has been active with [facility] and is not currently at a [facility]. [name] agreeable to scheduling a joint visit at [facility] but does not know what her schedule can accommodate at this moment. [name] wishes for CSW to contact her on 07/13/15 to schedule SNF visit.  Plan-CSW will contact patient's daughter on 07/13/15.  Eula Fried, BSW, MSW, [email or web address] Phone: [phone] Fax: [phone]

## 2015-07-16 ENCOUNTER — Other Ambulatory Visit: Payer: Self-pay | Admitting: Licensed Clinical Social Worker

## 2015-07-16 ENCOUNTER — Non-Acute Institutional Stay (SKILLED_NURSING_FACILITY): Payer: Medicare Other | Admitting: Internal Medicine

## 2015-07-16 DIAGNOSIS — J189 Pneumonia, unspecified organism: Secondary | ICD-10-CM

## 2015-07-16 DIAGNOSIS — M25512 Pain in left shoulder: Secondary | ICD-10-CM

## 2015-07-16 DIAGNOSIS — R233 Spontaneous ecchymoses: Secondary | ICD-10-CM

## 2015-07-16 DIAGNOSIS — G4761 Periodic limb movement disorder: Secondary | ICD-10-CM

## 2015-07-16 DIAGNOSIS — E538 Deficiency of other specified B group vitamins: Secondary | ICD-10-CM | POA: Diagnosis not present

## 2015-07-16 DIAGNOSIS — I10 Essential (primary) hypertension: Secondary | ICD-10-CM

## 2015-07-16 DIAGNOSIS — N390 Urinary tract infection, site not specified: Secondary | ICD-10-CM

## 2015-07-16 DIAGNOSIS — R410 Disorientation, unspecified: Secondary | ICD-10-CM | POA: Diagnosis not present

## 2015-07-16 LAB — CBC AND DIFFERENTIAL
HCT: 34 % — AB (ref 36–46)
Hemoglobin: 10.9 g/dL — AB (ref 12.0–16.0)
Platelets: 207 10*3/uL (ref 150–399)
WBC: 5.3 10^3/mL

## 2015-07-16 LAB — BASIC METABOLIC PANEL
BUN: 24 mg/dL — AB (ref 4–21)
CREATININE: 1 mg/dL (ref <=1.1)
Glucose: 102 mg/dL
POTASSIUM: 3.3 mmol/L — AB (ref 3.4–5.3)
SODIUM: 146 mmol/L (ref 137–147)

## 2015-07-16 NOTE — Progress Notes (Signed)
Patient ID: Lindsay Sheppard, female   DOB: 11/23/1920, 81 y.o.   MRN: [id number]    Facility: [facility]   Allergies  Allergen Reactions  . Amiodarone Other (See Comments)    REACTION: f? fluid retention  . Deltasone [Prednisone] Other (See Comments)    unknown  . Diltiazem Hcl Other (See Comments)    unknown  . Levofloxacin Nausea And Vomiting    REACTION: nausea  . Pregabalin Other (See Comments)    REACTION: hallucinations  . Spironolactone Other (See Comments)    REACTION: elevated K at lowest dose  . Tequin [Gatifloxacin] Other (See Comments)    Hypoglycemia  . Diazepam Anxiety and Other (See Comments)    REACTION: agitation   Chief Complaint  Patient presents with  . Acute Visit    rash on face, pneumonia and family concerns   HPI:  81 y.o. patient is seen for acute visit. Daughter present at bedside.   Rash- family and staff have noticed new petechiae to her face- mainly cheek area. No rash noted elsewhere. Patient denies itching. On review of chart, aricept is the new medication along with her augmentin  Pneumonia- noted on chest xray. Of note patient is here for rehab post hospital admission with CAP. The infiltrate remains same as of hospital xray. She was on po augmentin at time of discharge from hospital.   Involuntary movement Of her limbs per daughter which is new. No seizure like activity noted by family or staff.  Of note, she is here for short term rehabilitation post hospital admission from 07/06/15-07/10/15 with acute encephalopathy, hypertensive urgency, CAP and UTI along with acute kidney injury.    Review of Systems:  Constitutional: Negative for fever  HENT: Negative for headache.  Eyes: Negative for blurred vision, double vision and discharge.  Respiratory: Negative for cough, shortness of breath and wheezing.   Cardiovascular: Negative for chest pain, palpitations, leg swelling.  Gastrointestinal: Negative for  heartburn, vomiting, abdominal pain.  Genitourinary: has indwelling foley ctaheter Musculoskeletal: Negative for fall in the facility Skin: Negative for itching  Neurological: Negative for acute behavior changes Psychiatric/Behavioral: Negative for depression. Has memory loss.  Past Medical History  Diagnosis Date  . POSTHERPETIC NEURALGIA 11/02/2009  . B12 DEFICIENCY 05/23/2007  . HYPERLIPIDEMIA 05/23/2007  . HYPERKALEMIA 05/30/2010  . ANEMIA-IRON DEFICIENCY 01/26/2007  . BLEPHARITIS, BILATERAL 02/23/2008  . Other specified forms of hearing loss 05/30/2010  . HYPERTENSION 01/26/2007  . Atrial fibrillation ([place]) 05/23/2007  . DIASTOLIC HEART FAILURE, CHRONIC 12/14/2009  . POSTURAL HYPOTENSION 02/07/2008  . ALLERGIC RHINITIS 11/02/2009  . GERD 01/26/2007  . CONSTIPATION 01/25/2008  . SKIN LESION 05/30/2010  . BACK PAIN 08/02/2007  . OSTEOPOROSIS 05/23/2007  . SYNCOPE 02/07/2008  . FATIGUE 09/06/2007  . Dysuria 09/05/2008  . Abdominal pain, epigastric 09/06/2007  . EPIGASTRIC TENDERNESS 10/12/2007  . PULMONARY EMBOLISM, HX OF 05/23/2007  . SHINGLES, HX OF 05/23/2007  . 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 Left foot pain   . Diabetic neuropathy ([place])   . RENAL INSUFFICIENCY 07/02/2009  . RENAL CYST, LEFT 09/06/2007  . Kidney stones     "never had OR"  . Family history of anesthesia complication     "daughter has PONV; another daughter a little anesthesia lasts way too long"  . CHF (congestive heart failure) ([place]) 2011  . Myocardial infarction [facility]) 2011    "mild"  . DVT (deep venous thrombosis) ([place]) ~ 1964    "think it was in the  left"  . Aspiration pneumonia ([place]) ~ 2006    "due to aspiration"  . DIABETES MELLITUS, TYPE II 09/06/2007  . DEPRESSIVE DISORDER 01/25/2008    "@ times; never treated for it"  . Melanoma of nose ([place])   . Compression fracture of lumbar vertebra ([facility])     hx  . Compression fracture of thoracic vertebra ([place]) 02/07/2014    "fell this am" (02/07/2014  . Arthritis     "hands"  (03/15/2014)  . Foley catheter in place on admission    Medication reviewed. See [facility]  Physical exam BP 170/84 mmHg  Pulse 96  Temp(Src) 97.2 F (36.2 C)  Resp 18  General- elderly female, thin built, frail, in no acute distress Head- normocephalic, atraumatic Nose- no maxillary or frontal sinus tenderness, no nasal discharge Throat- moist mucus membrane Eyes- PERRLA, EOMI, no pallor, no icterus, no discharge, normal conjunctiva, normal sclera Neck- no cervical lymphadenopathy Cardiovascular- normal s1,s2, no murmurs, palpable dorsalis pedis and radial pulses, no leg edema Respiratory- bilateral clear to auscultation, no wheeze, no rhonchi, no crackles, no use of accessory muscles Abdomen- bowel sounds present, soft, non tender Musculoskeletal- able to move all 4 extremities, generalized weakness, left shoulder limited ROM Neurological- no focal deficit, alert and oriented to person, pleasantly confused Skin- warm and dry, left cheek area has ecchymoses and some petechiae to her face noted, bruise to left shoulder area, skin tear to left shin with ecchymoses   Labs- 07/16/15 wbc 5.1, hb 10.4, hct 33.3, mcv 88.3, plt 194, na 147, k 3.7, co2 27.5, glu 180, bun 29, cr 1.27, gfr 41.82, inr 2.1 07/14/15 wbc 5.4, hb 11, plt 199, na 136, k 3.6, bun 28, cr 1.38, u/a cloudy, many wbc and + leukocyte esterase   Assessment/plan  Petechiae and ecchymoses Scattered pattern. Get coagulation profile checked. Get platelet checked Will discontinue her augmentin for now as was to be completed on 07/12/15. Stop aricept. INR reviewed with her on coumadin and 2.7.   Altered mental state Has increased confusion compared to last visit. Discontinue her aricept and augmentin. Has hx of dementia and is deconditioned which could contribute to this.  Get cbc with diff, cmp checked. Discontinue lasix to avoid hypovolemia. Reviewed recent u/a report. Send u/a with c/s and start ceftriaxone 1 g im daily x 5  days. Hydration to be maintained. Aspiration precautions  UTI See above. Continue foley care for now  Pneumonia Seen on repeat cxr but I feel this was done too early and does not reflect complete resolution of recent pneumonia. No worsening cough, afebrile. Discontinue augmentin for now and monitor her labs   Periodic limb movement Start cogentin 0.5 mg daily for tremor and involuntary jerk. Check cmp to check for co2 retention and lyte abnormality  b12 def Give 1 dose of b12 1000 mcg/ml im per family request  Left shoulder pain With bruise on exam. Xray of left shoulder to evaluate further  HTN Elevated bp today, monitor bp q shift for now, continue current regimen of antihypertensive   Blanchie Serve, MD  [facility] [phone] (Monday-Friday 8 am - 5 pm) [phone] (afterhours)

## 2015-07-16 NOTE — Patient Outreach (Signed)
[facility]) Care Management  [facility] Social Work  07/16/2015  JARI CAROLLO 04-20-1921 [id number]   Current Medications:  Current Outpatient Prescriptions  Medication Sig Dispense Refill  . ACCU-CHEK COMPACT PLUS test strip   1  . acetaminophen (TYLENOL) 325 MG tablet Take 2 tablets (650 mg total) by mouth 3 (three) times daily. (Patient taking differently: Take two tablets by mouth every 6 hours as needed for pain)    . B-D INS SYRINGE 0.5CC/31GX5/16 31G X 5/16" 0.5 ML MISC   1  . cloNIDine (CATAPRES - DOSED IN MG/24 HR) 0.3 mg/24hr patch Place 0.3 mg onto the skin once a week.    Marland Kitchen CRANBERRY PO Take 500 mg by mouth daily.     . cyanocobalamin (,VITAMIN B-12,) 1000 MCG/ML injection INJECT 1 ML INTO THE MUSCLE EVERY 30 DAYS 1 mL 11  . donepezil (ARICEPT) 5 MG tablet Take 1 tablet (5 mg total) by mouth at bedtime. (Patient taking differently: Take 10 mg by mouth at bedtime. ) 90 tablet 3  . ferrous sulfate 325 (65 FE) MG tablet Take 1 tablet (325 mg total) by mouth 2 (two) times daily with a meal. 60 tablet 1  . fluticasone (FLONASE) 50 MCG/ACT nasal spray Place 2 sprays into the nose daily for allergies    . furosemide (LASIX) 20 MG tablet 1 tab by mouth per day as needed for swelling 90 tablet 3  . hydrALAZINE (APRESOLINE) 25 MG tablet Take 1 tablet (25 mg total) by mouth 3 (three) times daily. 270 tablet 3  . Hypromellose (ARTIFICIAL TEARS OP) Place 1 drop into both eyes daily as needed. For dry eyes    . insulin glargine (LANTUS) 100 UNIT/ML injection Inject 0.2 mLs (20 Units total) into the skin daily. 30 mL 3  . labetalol (NORMODYNE) 200 MG tablet Take 400 mg by mouth 2 (two) times daily.    . pantoprazole (PROTONIX) 40 MG tablet TAKE 1 BY MOUTH DAILY (Patient taking differently: Take one tablet by mouth once daily for stomach) 90 tablet 3  . polyethylene glycol (MIRALAX / GLYCOLAX) packet Take 17 g by mouth 2 (two) times daily. (Patient taking differently: Take 17  g by mouth daily. ) 14 each 0  . pravastatin (PRAVACHOL) 40 MG tablet TAKE 1/2 BY MOUTH DAILY (Patient taking differently: TAKE 1/2 tablet BY MOUTH DAILY) 45 tablet 3  . warfarin (COUMADIN) 2 MG tablet Take as directed by anticoagulation clinic (Patient taking differently: Take 2-4 mg by mouth daily at 6 PM. Take 1 tablet all days Except Thursday take 2 tablets) 120 tablet 3   No current facility-administered medications for this visit.    Functional Status:  In your present state of health, do you have any difficulty performing the following activities: 07/06/2015 04/10/2015  Hearing? Y N  Vision? N N  Difficulty concentrating or making decisions? Tempie Donning  Walking or climbing stairs? Y Y  Dressing or bathing? Y Y  Doing errands, shopping? Tempie Donning  Preparing Food and eating ? - Y  Using the Toilet? - Y  In the past six months, have you accidently leaked urine? - Y  Do you have problems with loss of bowel control? - N  Managing your Medications? - Y  Managing your Finances? - Y  Housekeeping or managing your Housekeeping? - Y    Fall/Depression Screening:  PHQ 2/9 Scores 06/12/2015 04/10/2015 03/22/2015 03/06/2015 09/27/2013 03/22/2013  PHQ - 2 Score 0 0 0 0 0 0  Exception  Documentation - - Other- indicate reason in comment box - - -  Not completed - - patient unable to answer; assessment completed with daughter - - -    Assessment: CSW completed SNF visit at [facility] on 07/16/15 with patient's daughter [name]. Patient's nurse was taking patient's vitals and giving her medications when CSW arrived. Patient's nurse shared that her assessment is "she has had increased confusion since she has arrived." [name] communicates to [place] that she was talking to the wall the other day which is new. Patient also has had recent blood vessels that have ruptured in her eye. Patient's daughter shares that the first 3-4 days were going great when patient arrived and that she was very active in physical therapy. [name]  reports this started to decline and therapy was discontinued. Patient's daughter reports that she has always been forgetful but has not been lucid and confused until recently. Patient had an X ray completed on 07/14/15 and pneumonia has stayed the same according to patient's MD who came into the room at this time. Patient's MD Blanchie Serve completed exam with patient while CSW was in the room. Patient's daughter provides permission for CSW to stay. Patient's mental state has seemed to decrease as she presents with some signs of dementia but has no diagnosis. Patient's mother was fearful that patient had a fall which caused an abrasion on her leg, bruise on her shoulder and suspected bruise on the side of patient's face which were not there the day before. Patient denies having a recent fall. Patient's daughters both provide daily care to patient. Before SNF admission, [name] provided care from 7:30-4:30 and her sister would provide care in the evening. Patient's retired son lives with her. [name] reports that it has been very hard on her with patient's recent health decline as she provides care to her husband who has alzheimer's. CSW completed assessment. Patient had a difficult time answering CSW and [name] assisted. [name] shared that before patient's hospitalization, she liked to be around family and great grand children, liked to participate in snapping peas and cooking and that she was active within the community. [name] reports no feelings of depression. [name] reports that she has found patient discussing being in a different time period and of a passing of a baby. [name] denies patient being this lucid before. [name] reports that she plans to stay with daughter one night this week. Patient went to discharge planner's office and met patient's social worker Yehuda Budd. CSW introduced self and reason for visit. Vinnie Level reports no updates at this time other than the ones I had received during the visit.  Vinnie Level shares that she will contact CSW with any further updates.   Plan: CSW will continue to follow up with patient to ensure a safe and stable discharge back home. CSW will be available to patient and family for all social work needs.  Eula Fried, BSW, MSW, [email or web address] Phone: [phone] Fax: [phone]

## 2015-07-20 ENCOUNTER — Non-Acute Institutional Stay (SKILLED_NURSING_FACILITY): Payer: Medicare Other | Admitting: Nurse Practitioner

## 2015-07-20 DIAGNOSIS — N390 Urinary tract infection, site not specified: Secondary | ICD-10-CM | POA: Diagnosis not present

## 2015-07-20 DIAGNOSIS — R233 Spontaneous ecchymoses: Secondary | ICD-10-CM

## 2015-07-20 DIAGNOSIS — Z7901 Long term (current) use of anticoagulants: Secondary | ICD-10-CM

## 2015-07-20 DIAGNOSIS — I482 Chronic atrial fibrillation, unspecified: Secondary | ICD-10-CM

## 2015-07-20 DIAGNOSIS — E876 Hypokalemia: Secondary | ICD-10-CM | POA: Diagnosis not present

## 2015-07-20 DIAGNOSIS — Z5181 Encounter for therapeutic drug level monitoring: Secondary | ICD-10-CM | POA: Diagnosis not present

## 2015-07-20 MED ORDER — WARFARIN SODIUM 1 MG PO TABS: ORAL_TABLET | ORAL | Status: DC

## 2015-07-20 NOTE — Progress Notes (Signed)
Patient ID: Lindsay Sheppard, female   DOB: 02/03/1921, 81 y.o.   MRN: [id number]    Nursing Home Location:  [place] of Service: SNF (31)  PCP: [name], MD  Allergies  Allergen Reactions  . Amiodarone Other (See Comments)    REACTION: f? fluid retention  . Deltasone [Prednisone] Other (See Comments)    unknown  . Diltiazem Hcl Other (See Comments)    unknown  . Levofloxacin Nausea And Vomiting    REACTION: nausea  . Pregabalin Other (See Comments)    REACTION: hallucinations  . Spironolactone Other (See Comments)    REACTION: elevated K at lowest dose  . Tequin [Gatifloxacin] Other (See Comments)    Hypoglycemia  . Diazepam Anxiety and Other (See Comments)    REACTION: agitation    Chief Complaint  Patient presents with  . Acute Visit    HPI:  Patient is a 81 y.o. female seen today at [facility] to follow up due to increase lethargy and petechiae. Pt with hx of HTN, DM, PE/DVT, Chronic foley catheter, gerd, and memory loss. Pt has been doing a lot better in the last week. Getting out of bed and more alert. No complaints on pain today. Overall petechiae has improved without new spots. Staff has no concerns at this time.  Review of Systems: limited due to memory loss Review of Systems  Constitutional: Negative for fever, activity change, appetite change and fatigue.  HENT: Negative for congestion and hearing loss.   Eyes: Negative.   Respiratory: Negative for cough and shortness of breath.   Cardiovascular: Negative for chest pain, palpitations and leg swelling.  Gastrointestinal: Negative for abdominal pain, diarrhea and constipation.  Genitourinary:       Chronic foley catheter  Musculoskeletal: Negative for myalgias and arthralgias.  Skin: Positive for color change (stable). Negative for wound.  Neurological: Negative for dizziness and weakness.  Psychiatric/Behavioral: Negative for behavioral problems and agitation.         Memory loss    Past Medical History  Diagnosis Date  . POSTHERPETIC NEURALGIA 11/02/2009  . B12 DEFICIENCY 05/23/2007  . HYPERLIPIDEMIA 05/23/2007  . HYPERKALEMIA 05/30/2010  . ANEMIA-IRON DEFICIENCY 01/26/2007  . BLEPHARITIS, BILATERAL 02/23/2008  . Other specified forms of hearing loss 05/30/2010  . HYPERTENSION 01/26/2007  . Atrial fibrillation ([place]) 05/23/2007  . DIASTOLIC HEART FAILURE, CHRONIC 12/14/2009  . POSTURAL HYPOTENSION 02/07/2008  . ALLERGIC RHINITIS 11/02/2009  . GERD 01/26/2007  . CONSTIPATION 01/25/2008  . SKIN LESION 05/30/2010  . BACK PAIN 08/02/2007  . OSTEOPOROSIS 05/23/2007  . SYNCOPE 02/07/2008  . FATIGUE 09/06/2007  . Dysuria 09/05/2008  . Abdominal pain, epigastric 09/06/2007  . EPIGASTRIC TENDERNESS 10/12/2007  . PULMONARY EMBOLISM, HX OF 05/23/2007  . SHINGLES, HX OF 05/23/2007  . 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 Left foot pain   . Diabetic neuropathy ([place])   . RENAL INSUFFICIENCY 07/02/2009  . RENAL CYST, LEFT 09/06/2007  . Kidney stones     "never had OR"  . Family history of anesthesia complication     "daughter has PONV; another daughter a little anesthesia lasts way too long"  . CHF (congestive heart failure) ([place]) 2011  . Myocardial infarction [facility]) 2011    "mild"  . DVT (deep venous thrombosis) ([place]) ~ 1964    "think it was in the left"  . Aspiration pneumonia ([place]) ~ 2006    "due to aspiration"  . DIABETES MELLITUS, TYPE II  09/06/2007  . DEPRESSIVE DISORDER 01/25/2008    "@ times; never treated for it"  . Melanoma of nose ([place])   . Compression fracture of lumbar vertebra (H[facility])     hx  . Compression fracture of thoracic vertebra ([place]) 02/07/2014    "fell this am" (02/07/2014  . Arthritis     "hands" (03/15/2014)  . Foley catheter in place on admission    Past Surgical History  Procedure Laterality Date  . Appendectomy  1943  . Abdominal hysterectomy  1964  . Cardiac electrophysiology mapping and ablation  1999  . Cataract extraction w/ intraocular lens   implant, bilateral  2003-2005  . Carpal tunnel release Right 2004  . Mohs surgery  2011    "removed off her nose"  . Cardiac catheterization  10/2009  . Esophageal dilation  X 2   Social History:   reports that she has never smoked. She has never used smokeless tobacco. She reports that she does not drink alcohol or use illicit drugs.  Family History  Problem Relation Age of Onset  . Breast cancer Mother   . Colon cancer Father   . Stroke Father     died with stroke postop  . Diabetes Sister     2 sisters  . Hypertension Other     Medications: Patient's Medications  New Prescriptions   No medications on file  Previous Medications   ACCU-CHEK COMPACT PLUS TEST STRIP       ACETAMINOPHEN (TYLENOL) 325 MG TABLET    Take 2 tablets (650 mg total) by mouth 3 (three) times daily.   B-D INS SYRINGE 0.5CC/31GX5/16 31G X 5/16" 0.5 ML MISC       CLONIDINE (CATAPRES - DOSED IN MG/24 HR) 0.3 MG/24HR PATCH    Place 0.3 mg onto the skin once a week.   CRANBERRY PO    Take 500 mg by mouth daily.    CYANOCOBALAMIN (,VITAMIN B-12,) 1000 MCG/ML INJECTION    INJECT 1 ML INTO THE MUSCLE EVERY 30 DAYS   FERROUS SULFATE 325 (65 FE) MG TABLET    Take 1 tablet (325 mg total) by mouth 2 (two) times daily with a meal.   FLUTICASONE (FLONASE) 50 MCG/ACT NASAL SPRAY    Place 2 sprays into the nose daily for allergies   FUROSEMIDE (LASIX) 20 MG TABLET    1 tab by mouth per day as needed for swelling   HYDRALAZINE (APRESOLINE) 25 MG TABLET    Take 1 tablet (25 mg total) by mouth 3 (three) times daily.   HYPROMELLOSE (ARTIFICIAL TEARS OP)    Place 1 drop into both eyes daily as needed. For dry eyes   INSULIN GLARGINE (LANTUS) 100 UNIT/ML INJECTION    Inject 0.2 mLs (20 Units total) into the skin daily.   LABETALOL (NORMODYNE) 200 MG TABLET    Take 400 mg by mouth 2 (two) times daily.   PANTOPRAZOLE (PROTONIX) 40 MG TABLET    TAKE 1 BY MOUTH DAILY   POLYETHYLENE GLYCOL (MIRALAX / GLYCOLAX) PACKET    Take 17 g  by mouth 2 (two) times daily.   PRAVASTATIN (PRAVACHOL) 40 MG TABLET    TAKE 1/2 BY MOUTH DAILY  Modified Medications   Modified Medication Previous Medication   WARFARIN (COUMADIN) 1 MG TABLET warfarin (COUMADIN) 2 MG tablet      Take as directed by anticoagulation clinic    Take as directed by anticoagulation clinic  Discontinued Medications   DONEPEZIL (ARICEPT) 5 MG TABLET  Take 1 tablet (5 mg total) by mouth at bedtime.     Physical Exam: Filed Vitals:   07/20/15 1209  BP: 134/65  Pulse: 92  Temp: 97.2 F (36.2 C)  Resp: 20    Physical Exam  Constitutional: She appears well-developed and well-nourished. No distress.  HENT:  Head: Normocephalic and atraumatic.  Mouth/Throat: Oropharynx is clear and moist. No oropharyngeal exudate.  Eyes: Conjunctivae are normal. Pupils are equal, round, and reactive to light.  Neck: Normal range of motion. Neck supple.  Cardiovascular: Normal rate, regular rhythm and normal heart sounds.   Pulmonary/Chest: Effort normal and breath sounds normal.  Abdominal: Soft. Bowel sounds are normal.  Musculoskeletal: She exhibits no edema or tenderness.  Neurological: She is alert.  Skin: Skin is warm and dry. Purpura (noted bilateraly upper arms, chest and face) and rash noted. She is not diaphoretic.  Psychiatric: She has a normal mood and affect.    Labs reviewed: Basic Metabolic Panel:  Recent Labs  07/07/15 0539 07/08/15 0244 07/09/15 0406 07/16/15  NA 139 138 140 146  K 4.2 4.2 4.1 3.3*  CL 105 105 108  --   CO2 25 25 24   --   GLUCOSE 73 223* 133*  --   BUN 22* 29* 25* 24*  CREATININE 1.24* 1.35* 1.21* 1.0  CALCIUM 9.3 9.0 9.0  --    Liver Function Tests:  Recent Labs  09/05/14 0406 03/06/15 1028 07/06/15 1220  AST 23 16 22   ALT 22 14 20   ALKPHOS 59 51 55  BILITOT 0.4 0.4 0.6  PROT 5.4* 5.9* 5.0*  ALBUMIN 3.2* 3.5 3.1*   No results for input(s): LIPASE, AMYLASE in the last 8760 hours. No results for input(s):  AMMONIA in the last 8760 hours. CBC:  Recent Labs  09/05/14 0406  03/06/15 1028  07/06/15 1220  07/08/15 0244 07/09/15 0406 07/10/15 0524 07/16/15  WBC 4.9  < > 4.4  < > 4.8  < > 5.7 5.7 5.7 5.3  NEUTROABS 3.5  --  3.3  --  3.7  --   --   --   --   --   HGB 12.5  < > 11.9*  < > 11.5*  < > 10.7* 10.7* 10.6* 10.9*  HCT 38.4  < > 36.1  < > 35.6*  < > 34.3* 34.6* 34.3* 34*  MCV 89.1  < > 82.3  < > 87.5  < > 89.3 89.4 88.9  --   PLT 172  < > 181.0  < > PLATELETS APPEAR ADEQUATE  < > 163 159 168 207  < > = values in this interval not displayed. TSH:  Recent Labs  08/08/14 1129 03/06/15 1028  TSH 1.82 1.51   A1C: Lab Results  Component Value Date   HGBA1C 6.0 03/06/2015   Lipid Panel:  Recent Labs  08/08/14 1129 03/06/15 1028  CHOL 123 115  HDL 39.70 35.30*  LDLCALC 70 60  TRIG 68.0 97.0  CHOLHDL 3 3     Assessment/Plan 1. UTI (lower urinary tract infection) Currently being treated with rocephin for total of 5 days, last dose 07/21/15  2. Chronic atrial fibrillation (HCC) Rate controled on current regimen, cont anticoagulation on couamdin  3. Anticoagulated on Coumadin -INR currently at 1.6, coumadin was previously on hold for elevated INR. Will follow up INR on 07/23/15 - warfarin (COUMADIN) 1 MG tablet; Take as directed by anticoagulation clinic  4. Hypokalemia  Potassium 40 meq today Follow up BMP on 07/23/15  5. Petechiae To bilateral arms and chest improving, still with noted skin discoloration to face however this appears more like bruising and is actually improving as well   Lindsay Sheppard Battiest  [facility] [phone] 8 am - 5 pm) [phone] (after hours)

## 2015-07-23 ENCOUNTER — Other Ambulatory Visit: Payer: Self-pay | Admitting: Licensed Clinical Social Worker

## 2015-07-23 ENCOUNTER — Non-Acute Institutional Stay (SKILLED_NURSING_FACILITY): Payer: Medicare Other | Admitting: Nurse Practitioner

## 2015-07-23 ENCOUNTER — Encounter: Payer: Self-pay | Admitting: Nurse Practitioner

## 2015-07-23 DIAGNOSIS — I482 Chronic atrial fibrillation, unspecified: Secondary | ICD-10-CM

## 2015-07-23 DIAGNOSIS — F0390 Unspecified dementia without behavioral disturbance: Secondary | ICD-10-CM

## 2015-07-23 DIAGNOSIS — Z7901 Long term (current) use of anticoagulants: Secondary | ICD-10-CM

## 2015-07-23 DIAGNOSIS — E43 Unspecified severe protein-calorie malnutrition: Secondary | ICD-10-CM

## 2015-07-23 DIAGNOSIS — G4700 Insomnia, unspecified: Secondary | ICD-10-CM | POA: Diagnosis not present

## 2015-07-23 DIAGNOSIS — Z5181 Encounter for therapeutic drug level monitoring: Secondary | ICD-10-CM

## 2015-07-23 LAB — POCT INR: INR: 1.4 — AB (ref 0.9–1.1)

## 2015-07-23 MED ORDER — WARFARIN SODIUM 1 MG PO TABS: ORAL_TABLET | ORAL | Status: DC

## 2015-07-23 NOTE — Progress Notes (Signed)
Patient ID: Lindsay Sheppard, female   DOB: May 08, 1921, 81 y.o.   MRN: [id number]    Nursing Home Location:  [place] of Service: SNF (31)  PCP: [name], MD  Allergies  Allergen Reactions  . Amiodarone Other (See Comments)    REACTION: f? fluid retention  . Deltasone [Prednisone] Other (See Comments)    unknown  . Diltiazem Hcl Other (See Comments)    unknown  . Levofloxacin Nausea And Vomiting    REACTION: nausea  . Pregabalin Other (See Comments)    REACTION: hallucinations  . Spironolactone Other (See Comments)    REACTION: elevated K at lowest dose  . Tequin [Gatifloxacin] Other (See Comments)    Hypoglycemia  . Diazepam Anxiety and Other (See Comments)    REACTION: agitation    Chief Complaint  Patient presents with  . Acute Visit    Acute concerns     HPI:  Patient is a 81 y.o. female seen today at [facility] for acute visit at the request of family. Pt with a hx of HTN, DM, PE/DVT, Chronic foley catheter, gerd, and memory loss. Pt had been doing well last week and over the weekend has gotten worse. Family noted increased confusion and not sleeping at night. Family reports decreased appetite and PO intake over several weeks. Pt family with questions regarding memory loss and progression of dementia, pt was previously living at home with assistance with family members.   Review of Systems: by nursing, pt and family  Review of Systems  Constitutional: Negative for fever, activity change, appetite change and fatigue.  HENT: Negative for congestion and hearing loss.   Eyes: Negative.   Respiratory: Negative for cough and shortness of breath.   Cardiovascular: Negative for chest pain, palpitations and leg swelling.  Gastrointestinal: Negative for abdominal pain, diarrhea and constipation.  Genitourinary:       Chronic foley catheter  Musculoskeletal: Negative for myalgias and arthralgias.  Skin: Positive for color  change (stable). Negative for wound.  Neurological: Negative for dizziness and weakness.  Psychiatric/Behavioral: Negative for behavioral problems and agitation.       Memory loss    Past Medical History  Diagnosis Date  . POSTHERPETIC NEURALGIA 11/02/2009  . B12 DEFICIENCY 05/23/2007  . HYPERLIPIDEMIA 05/23/2007  . HYPERKALEMIA 05/30/2010  . ANEMIA-IRON DEFICIENCY 01/26/2007  . BLEPHARITIS, BILATERAL 02/23/2008  . Other specified forms of hearing loss 05/30/2010  . HYPERTENSION 01/26/2007  . Atrial fibrillation ([place]) 05/23/2007  . DIASTOLIC HEART FAILURE, CHRONIC 12/14/2009  . POSTURAL HYPOTENSION 02/07/2008  . ALLERGIC RHINITIS 11/02/2009  . GERD 01/26/2007  . CONSTIPATION 01/25/2008  . SKIN LESION 05/30/2010  . BACK PAIN 08/02/2007  . OSTEOPOROSIS 05/23/2007  . SYNCOPE 02/07/2008  . FATIGUE 09/06/2007  . Dysuria 09/05/2008  . Abdominal pain, epigastric 09/06/2007  . EPIGASTRIC TENDERNESS 10/12/2007  . PULMONARY EMBOLISM, HX OF 05/23/2007  . SHINGLES, HX OF 05/23/2007  . 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 Left foot pain   . Diabetic neuropathy ([place])   . RENAL INSUFFICIENCY 07/02/2009  . RENAL CYST, LEFT 09/06/2007  . Kidney stones     "never had OR"  . Family history of anesthesia complication     "daughter has PONV; another daughter a little anesthesia lasts way too long"  . CHF (congestive heart failure) ([place]) 2011  . Myocardial infarction [facility]) 2011    "mild"  . DVT (deep venous thrombosis) ([place]) ~ 1964    "  think it was in the left"  . Aspiration pneumonia ([place]) ~ 2006    "due to aspiration"  . DIABETES MELLITUS, TYPE II 09/06/2007  . DEPRESSIVE DISORDER 01/25/2008    "@ times; never treated for it"  . Melanoma of nose ([place])   . Compression fracture of lumbar vertebra (HCC)     hx  . Compression fracture of thoracic vertebra ([place]) 02/07/2014    "fell this am" (02/07/2014  . Arthritis     "hands" (03/15/2014)  . Foley catheter in place on admission    Past Surgical History  Procedure  Laterality Date  . Appendectomy  1943  . Abdominal hysterectomy  1964  . Cardiac electrophysiology mapping and ablation  1999  . Cataract extraction w/ intraocular lens  implant, bilateral  2003-2005  . Carpal tunnel release Right 2004  . Mohs surgery  2011    "removed off her nose"  . Cardiac catheterization  10/2009  . Esophageal dilation  X 2   Social History:   reports that she has never smoked. She has never used smokeless tobacco. She reports that she does not drink alcohol or use illicit drugs.  Family History  Problem Relation Age of Onset  . Breast cancer Mother   . Colon cancer Father   . Stroke Father     died with stroke postop  . Diabetes Sister     2 sisters  . Hypertension Other     Medications: Patient's Medications  New Prescriptions   No medications on file  Previous Medications   ACCU-CHEK COMPACT PLUS TEST STRIP       AMBULATORY NON FORMULARY MEDICATION    Medication Name: Med pass 120 cc by mouth two times daily in between meals   AMOXICILLIN-CLAVULANATE (AUGMENTIN) 875-125 MG TABLET    Take 1 tablet by mouth 2 (two) times daily. X 10 days, begin 07/14/15, end 07/2215   B-D INS SYRINGE 0.5CC/31GX5/16 31G X 5/16" 0.5 ML MISC       BENZTROPINE (COGENTIN) 0.5 MG TABLET    Take 0.5 mg by mouth daily. For involuntary jerks   CLONIDINE (CATAPRES - DOSED IN MG/24 HR) 0.3 MG/24HR PATCH    Place 0.3 mg onto the skin once a week.   CRANBERRY PO    Take 500 mg by mouth daily.    CYANOCOBALAMIN (,VITAMIN B-12,) 1000 MCG/ML INJECTION    INJECT 1 ML INTO THE MUSCLE EVERY 30 DAYS   FERROUS SULFATE 325 (65 FE) MG TABLET    Take 1 tablet (325 mg total) by mouth 2 (two) times daily with a meal.   FLUTICASONE (FLONASE) 50 MCG/ACT NASAL SPRAY    Place 2 sprays into the nose daily for allergies   FUROSEMIDE (LASIX) 20 MG TABLET    1 tab by mouth per day as needed for swelling   HYDRALAZINE (APRESOLINE) 25 MG TABLET    Take 1 tablet (25 mg total) by mouth 3 (three) times daily.    HYPROMELLOSE (ARTIFICIAL TEARS OP)    Place 1 drop into both eyes daily as needed. For dry eyes   INSULIN GLARGINE (LANTUS) 100 UNIT/ML INJECTION    Inject 0.2 mLs (20 Units total) into the skin daily.   LABETALOL (NORMODYNE) 200 MG TABLET    Take 400 mg by mouth 2 (two) times daily.   PANTOPRAZOLE (PROTONIX) 40 MG TABLET    TAKE 1 BY MOUTH DAILY   PRAVASTATIN (PRAVACHOL) 20 MG TABLET    Take 20 mg by mouth daily.  Modified Medications   Modified Medication Previous Medication   ACETAMINOPHEN (TYLENOL) 325 MG TABLET acetaminophen (TYLENOL) 325 MG tablet      Take 2 tablets (650 mg total) by mouth 3 (three) times daily.    Take 2 tablets (650 mg total) by mouth 3 (three) times daily.   POLYETHYLENE GLYCOL (MIRALAX / GLYCOLAX) PACKET polyethylene glycol (MIRALAX / GLYCOLAX) packet      Take 17 g by mouth 2 (two) times daily.    Take 17 g by mouth 2 (two) times daily.   WARFARIN (COUMADIN) 1 MG TABLET warfarin (COUMADIN) 1 MG tablet      1.5 mg daily recheck PT/INW 1 week on 07/30/15    Take as directed by anticoagulation clinic  Discontinued Medications   PRAVASTATIN (PRAVACHOL) 40 MG TABLET    TAKE 1/2 BY MOUTH DAILY     Physical Exam: Filed Vitals:   07/23/15 1048  BP: 135/85  Pulse: 99  Temp: 98.2 F (36.8 C)  TempSrc: Oral  Resp: 17  Height: 5\' 5"  (1.651 m)  Weight: 134 lb (60.782 kg)  SpO2: 94%    Physical Exam  Constitutional: No distress.  HENT:  Head: Normocephalic and atraumatic.  Mouth/Throat: Oropharynx is clear and moist. No oropharyngeal exudate.  Eyes: Conjunctivae are normal. Pupils are equal, round, and reactive to light.  Neck: Normal range of motion. Neck supple.  Cardiovascular: Normal rate, regular rhythm and normal heart sounds.   Pulmonary/Chest: Effort normal and breath sounds normal.  Abdominal: Soft. Bowel sounds are normal.  Musculoskeletal: She exhibits no edema or tenderness.  Neurological: She is alert.  Skin: Skin is warm and dry. Purpura  (noted bilateraly upper arms, chest and face) and rash noted. She is not diaphoretic.  stable  Psychiatric: She has a normal mood and affect.    Labs reviewed: Basic Metabolic Panel:  Recent Labs  07/07/15 0539 07/08/15 0244 07/09/15 0406 07/16/15  NA 139 138 140 146  K 4.2 4.2 4.1 3.3*  CL 105 105 108  --   CO2 25 25 24   --   GLUCOSE 73 223* 133*  --   BUN 22* 29* 25* 24*  CREATININE 1.24* 1.35* 1.21* 1.0  CALCIUM 9.3 9.0 9.0  --    Liver Function Tests:  Recent Labs  09/05/14 0406 03/06/15 1028 07/06/15 1220  AST 23 16 22   ALT 22 14 20   ALKPHOS 59 51 55  BILITOT 0.4 0.4 0.6  PROT 5.4* 5.9* 5.0*  ALBUMIN 3.2* 3.5 3.1*   No results for input(s): LIPASE, AMYLASE in the last 8760 hours. No results for input(s): AMMONIA in the last 8760 hours. CBC:  Recent Labs  09/05/14 0406  03/06/15 1028  07/06/15 1220  07/08/15 0244 07/09/15 0406 07/10/15 0524 07/16/15  WBC 4.9  < > 4.4  < > 4.8  < > 5.7 5.7 5.7 5.3  NEUTROABS 3.5  --  3.3  --  3.7  --   --   --   --   --   HGB 12.5  < > 11.9*  < > 11.5*  < > 10.7* 10.7* 10.6* 10.9*  HCT 38.4  < > 36.1  < > 35.6*  < > 34.3* 34.6* 34.3* 34*  MCV 89.1  < > 82.3  < > 87.5  < > 89.3 89.4 88.9  --   PLT 172  < > 181.0  < > PLATELETS APPEAR ADEQUATE  < > 163 159 168 207  < > = values in  this interval not displayed. TSH:  Recent Labs  08/08/14 1129 03/06/15 1028  TSH 1.82 1.51   A1C: Lab Results  Component Value Date   HGBA1C 6.0 03/06/2015   Lipid Panel:  Recent Labs  08/08/14 1129 03/06/15 1028  CHOL 123 115  HDL 39.70 35.30*  LDLCALC 70 60  TRIG 68.0 97.0  CHOLHDL 3 3    INR 1.4 07/23/15  Assessment/Plan 1. Anticoagulated on Coumadin - warfarin (COUMADIN) 1 MG tablet; INR of 1.4 today will increase to 1.5 mg daily recheck PT/INW 1 week on 07/30/15  2. Chronic atrial fibrillation (HCC) -rate controlled on current, conts on coumadin for anticoagulation   3. Dementia, without behavioral  disturbance -worsening dementia and confusion at times. Was doing better last week and now has been worse over the weekend. Also not sleeping at night.  - previously on aricept but taken off due to possible side effect -will start namenda XR titration to 28 mg daily -bmp pending -will get cbc and tsh  4. Protein-calorie malnutrition, severe (HCC) -no appetite and decrease intake. Will start pt on remeron 7.5 mg qhs  5. Insomnia -pt wake/sleep cycle off which could be contributing to increased confusion/dilrium.  -will start remeron 7.5 mg qhs and monitor       Nakeesha Bowler K. Harle Battiest  [facility] [phone] 8 am - 5 pm) [phone] (after hours)

## 2015-07-23 NOTE — Patient Outreach (Signed)
[facility] [facility]) Care Management  07/23/2015  Lindsay Sheppard 07-04-1921 [id number]   Assessment-CSW completed outreach to patient's daughter [name] on 07/23/15. [name] provided HIPPA verifications. [name] shares that "her health has declined again over this past weekend." [name] shares that both herself and sister have been switching staying over night at [facility] for the past few days. [name] reports that "She is more delirious now than she was before. She is jerking a lot more like how you show her the other day." [name] expresses concerns for "not knowing what the reasoning is for this recent decline." [name] shares SNF doctor will not be here today but that SNF nurse practitioner will be assessing patient shortly. [name] reports that they have completed blood work this morning. [name] and sister are unsure if this is a natural cause decline in health or if something else is contributing to it. [name] reports that "to my knowledge no discharge date has been set." [name] agreeable to [place] contacting her on 07/25/15 to gain further updates and discuss discharge planning.   CSW contacted SNF discharge planner Vinnie Level and left a HIPPA compliant voice message encouraging her to return call once available.  Plan-CSW will await call back from SNF discharge planner. CSW will make outreach to patient's daughter on 07/25/15.  Eula Fried, BSW, MSW, [email or web address] Phone: [phone] Fax: [phone]

## 2015-07-24 ENCOUNTER — Non-Acute Institutional Stay (SKILLED_NURSING_FACILITY): Payer: Medicare Other | Admitting: Internal Medicine

## 2015-07-24 DIAGNOSIS — B3741 Candidal cystitis and urethritis: Secondary | ICD-10-CM | POA: Diagnosis not present

## 2015-07-24 DIAGNOSIS — E870 Hyperosmolality and hypernatremia: Secondary | ICD-10-CM | POA: Diagnosis not present

## 2015-07-24 DIAGNOSIS — R5383 Other fatigue: Secondary | ICD-10-CM | POA: Diagnosis not present

## 2015-07-24 DIAGNOSIS — K5900 Constipation, unspecified: Secondary | ICD-10-CM | POA: Diagnosis not present

## 2015-07-24 NOTE — Progress Notes (Signed)
Patient ID: Lindsay Sheppard, female   DOB: 30-Jul-1921, 81 y.o.   MRN: [id number]      [facility]  PCP: [name], MD    Code Status: Full Code  Allergies  Allergen Reactions  . Amiodarone Other (See Comments)    REACTION: f? fluid retention  . Deltasone [Prednisone] Other (See Comments)    unknown  . Diltiazem Hcl Other (See Comments)    unknown  . Levofloxacin Nausea And Vomiting    REACTION: nausea  . Pregabalin Other (See Comments)    REACTION: hallucinations  . Spironolactone Other (See Comments)    REACTION: elevated K at lowest dose  . Tequin [Gatifloxacin] Other (See Comments)    Hypoglycemia  . Diazepam Anxiety and Other (See Comments)    REACTION: agitation    Chief Complaint  Patient presents with  . Acute Visit    hypernatremia, lethargy, confusion     HPI:  81 y.o. patient is seen for acute visit. She has been undergoing rehabilitation here. She has been diagnosed to have yeast growing in her urine culture. She was started on namenda and remeron yesterday. She has been sleeping for most part of the day. She ate her lunch with assistance today. Her lab result shows elevated sodium and chloride level. As per daughter who is at bedside, she has been asking for water frequently today. She has PMH of HTN, DM, PE/DVT, Chronic foley catheter, gerd among others.   Review of Systems:  Constitutional: Negative for fever, chills, diaphoresis.  HENT: Negative for headache.  Respiratory: Negative for cough, shortness of breath and wheezing.   Cardiovascular: Negative for chest pain, palpitations, leg swelling.  Gastrointestinal: Negative for heartburn, vomiting, abdominal pain. No bowel movement for 2-3 days Genitourinary: has indwelling foley ctaheter Musculoskeletal: Negative for back pain, falls. Skin: Negative for itching, rash.  Neurological: Negative for tingling. Has occasional dizziness.  Psychiatric/Behavioral: Negative for depression. Has memory  loss.   Past Medical History  Diagnosis Date  . POSTHERPETIC NEURALGIA 11/02/2009  . B12 DEFICIENCY 05/23/2007  . HYPERLIPIDEMIA 05/23/2007  . HYPERKALEMIA 05/30/2010  . ANEMIA-IRON DEFICIENCY 01/26/2007  . BLEPHARITIS, BILATERAL 02/23/2008  . Other specified forms of hearing loss 05/30/2010  . HYPERTENSION 01/26/2007  . Atrial fibrillation ([place]) 05/23/2007  . DIASTOLIC HEART FAILURE, CHRONIC 12/14/2009  . POSTURAL HYPOTENSION 02/07/2008  . ALLERGIC RHINITIS 11/02/2009  . GERD 01/26/2007  . CONSTIPATION 01/25/2008  . SKIN LESION 05/30/2010  . BACK PAIN 08/02/2007  . OSTEOPOROSIS 05/23/2007  . SYNCOPE 02/07/2008  . FATIGUE 09/06/2007  . Dysuria 09/05/2008  . Abdominal pain, epigastric 09/06/2007  . EPIGASTRIC TENDERNESS 10/12/2007  . PULMONARY EMBOLISM, HX OF 05/23/2007  . SHINGLES, HX OF 05/23/2007  . 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 Left foot pain   . Diabetic neuropathy ([place])   . RENAL INSUFFICIENCY 07/02/2009  . RENAL CYST, LEFT 09/06/2007  . Kidney stones     "never had OR"  . Family history of anesthesia complication     "daughter has PONV; another daughter a little anesthesia lasts way too long"  . CHF (congestive heart failure) ([place]) 2011  . Myocardial infarction [facility]) 2011    "mild"  . DVT (deep venous thrombosis) ([place]) ~ 1964    "think it was in the left"  . Aspiration pneumonia ([place]) ~ 2006    "due to aspiration"  . DIABETES MELLITUS, TYPE II 09/06/2007  . DEPRESSIVE DISORDER 01/25/2008    "@ times; never treated for it"  .  Melanoma of nose ([place])   . Compression fracture of lumbar vertebra (HCC)     hx  . Compression fracture of thoracic vertebra ([place]) 02/07/2014    "fell this am" (02/07/2014  . Arthritis     "hands" (03/15/2014)  . Foley catheter in place on admission    Past Surgical History  Procedure Laterality Date  . Appendectomy  1943  . Abdominal hysterectomy  1964  . Cardiac electrophysiology mapping and ablation  1999  . Cataract extraction w/ intraocular lens  implant,  bilateral  2003-2005  . Carpal tunnel release Right 2004  . Mohs surgery  2011    "removed off her nose"  . Cardiac catheterization  10/2009  . Esophageal dilation  X 2   Medication reviewed. See MAR  Physical Exam: Filed Vitals:   07/24/15 1815  BP: 135/85  Pulse: 88  Temp: 97.9 F (36.6 C)  Resp: 18  SpO2: 97%    General- elderly female, thin built, frail, in no acute distress Head- normocephalic, atraumatic Nose- normal nasal mucosa, no maxillary or frontal sinus tenderness, no nasal discharge Throat- moist mucus membrane Eyes- PERRLA, EOMI, no pallor, no icterus, no discharge, normal conjunctiva, normal sclera Neck- no cervical lymphadenopathy Cardiovascular- normal s1,s2, no murmurs, palpable dorsalis pedis and radial pulses, no leg edema Respiratory- bilateral clear to auscultation, no wheeze, no rhonchi, no crackles, no use of accessory muscles Abdomen- bowel sounds present, soft, non tender Musculoskeletal- able to move all 4 extremities, generalized weakness  Neurological- no focal deficit, alert and oriented to person Skin- warm and dry, ecchymoses on her face, skin tear to right lower leg   Labs reviewed: Basic Metabolic Panel:  Recent Labs  07/07/15 0539 07/08/15 0244 07/09/15 0406 07/16/15  NA 139 138 140 146  K 4.2 4.2 4.1 3.3*  CL 105 105 108  --   CO2 25 25 24   --   GLUCOSE 73 223* 133*  --   BUN 22* 29* 25* 24*  CREATININE 1.24* 1.35* 1.21* 1.0  CALCIUM 9.3 9.0 9.0  --    Liver Function Tests:  Recent Labs  09/05/14 0406 03/06/15 1028 07/06/15 1220  AST 23 16 22   ALT 22 14 20   ALKPHOS 59 51 55  BILITOT 0.4 0.4 0.6  PROT 5.4* 5.9* 5.0*  ALBUMIN 3.2* 3.5 3.1*   No results for input(s): LIPASE, AMYLASE in the last 8760 hours. No results for input(s): AMMONIA in the last 8760 hours. CBC:  Recent Labs  09/05/14 0406  03/06/15 1028  07/06/15 1220  07/08/15 0244 07/09/15 0406 07/10/15 0524 07/16/15  WBC 4.9  < > 4.4  < > 4.8  < >  5.7 5.7 5.7 5.3  NEUTROABS 3.5  --  3.3  --  3.7  --   --   --   --   --   HGB 12.5  < > 11.9*  < > 11.5*  < > 10.7* 10.7* 10.6* 10.9*  HCT 38.4  < > 36.1  < > 35.6*  < > 34.3* 34.6* 34.3* 34*  MCV 89.1  < > 82.3  < > 87.5  < > 89.3 89.4 88.9  --   PLT 172  < > 181.0  < > PLATELETS APPEAR ADEQUATE  < > 163 159 168 207  < > = values in this interval not displayed.  07/23/15 na 153, k 3.5, bun 25, cr 1.09, cl 114, co2 29.2, gfr 49.89   Radiological Exams: Dg Chest 2 View  07/06/2015  CLINICAL DATA:  Altered mental status EXAM: CHEST  2 VIEW COMPARISON:  September 04, 2014 FINDINGS: There is patchy airspace consolidation in the left base with minimal left effusion. Lungs elsewhere clear. Heart size and pulmonary vascularity are normal. No adenopathy. There is atherosclerotic change in aorta. There is anterior wedging of several thoracic vertebral bodies. IMPRESSION: Left base airspace consolidation. Followup PA and lateral chest radiographs recommended in 3-4 weeks following trial of antibiotic therapy to ensure resolution and exclude underlying malignancy. Electronically Signed   By: Lowella Grip III M.D.   On: 07/06/2015 12:57   Ct Head Wo Contrast  07/06/2015  CLINICAL DATA:  Increased fatigue, lethargy and confusion EXAM: CT HEAD WITHOUT CONTRAST TECHNIQUE: Contiguous axial images were obtained from the base of the skull through the vertex without contrast. COMPARISON:  08/27/2014 . FINDINGS: stable diffuse brain atrophy pattern and chronic periventricular white matter microvascular ischemic change. No acute intracranial hemorrhage, mass lesion, definite infarction, midline shift, herniation, hydrocephalus, or extra-axial fluid collection. No focal mass effect or edema. Mild cerebellar atrophy as well. Orbits are symmetric. Sinuses and mastoids remain clear. Skull appears intact IMPRESSION: Stable atrophy pattern and minor periventricular white matter microvascular ischemic change. No interval  change or acute process by noncontrast CT. Electronically Signed   By: Jerilynn Mages.  Shick M.D.   On: 07/06/2015 12:56   Dg Abd Portable 2v  07/06/2015  CLINICAL DATA:  Right lower quadrant abdominal pain and vomiting today EXAM: PORTABLE ABDOMEN - 2 VIEW COMPARISON:  09/04/2014 CT abdomen/pelvis FINDINGS: No disproportionately dilated small bowel loops or fluid levels to suggest small bowel obstruction. Mild diffuse gaseous distention of the stool filled colon and rectum. No evidence of pneumatosis or pneumoperitoneum. Mild patchy opacity at the left lung base. Marked degenerative changes throughout the visualized thoracolumbar spine and atherosclerotic calcifications throughout the abdomen and pelvis. IMPRESSION: Nonobstructive bowel gas pattern.  No free air. Patchy left lung base opacity, correlate with chest radiograph. Electronically Signed   By: Ilona Sorrel M.D.   On: 07/06/2015 20:44     Assessment/Plan  Hypernatremia Likely from poor fluid intake which has been a problem recently per daughter. She has been asking for some fluid today but only taking small sips. Encourage fluid intake for now. Check repeat bmp on 07/30/15. If worsens, will get D5W started and do further workup to assess for ADH insufficiency.  Yeast cystitis Unclear if has real infection vs colonization. Start fluconazole 100 mg daily x 3 days empirically and monitor clinically. Hydration to be enocuraged. Foley care  Lethargy Had been making improvement until this weekend where she started having confusion episodes. Urine culture + for yeast. This could be contributing some along with her hypernatremia. Staring antibiotics and fluid as above should help. Discontinue namenda and remeron for now. Avoid any CNS depressant agent for now.   Constipation Currently on miralax bid, continue this for now. No signs of bowel obstruction at present.    Goals of care: short term rehabilitation   Labs/tests ordered: cbc with diff, cmp  07/30/15  Family/ staff Communication: reviewed care plan with patient's daughter and nursing supervisor    Blanchie Serve, MD  [place] [phone] (Monday-Friday 8 am - 5 pm) [phone] (afterhours)

## 2015-07-25 ENCOUNTER — Other Ambulatory Visit: Payer: Self-pay | Admitting: Licensed Clinical Social Worker

## 2015-07-25 NOTE — Patient Outreach (Signed)
[facility]) Care Management  07/25/2015  Lindsay Sheppard May 03, 1921 [id number]   Assessment-CSW completed outreach to patient's daughter Lindsay Sheppard. Lindsay Sheppard answered and provided HIPPA verifications. Daughter shares that patient has "had a turn around for the better." She reports that they received blood work test results and that her sodium and chloride levels were "sky high" which indicted severe dehydration. Patient is no receiving water to drink instead of other supplement drinks. Patient's symptoms have decreased since treating dehydration. Patient had a "great morning after eating a big breakfast, getting a bath and dressed and I rolled her hair." Daughter reports that she is hoping to be able to take patient out of SNF for Thanksgiving day. Lindsay Sheppard appreciative of CSW calling for updates. CSW has not heard back from SNF discharge planner.  Plan-CSW will contact patient's family within one week for further updates.  Eula Fried, BSW, MSW, [email or web address] Phone: [phone] Fax: [phone]

## 2015-07-31 ENCOUNTER — Encounter (HOSPITAL_COMMUNITY): Payer: Self-pay

## 2015-07-31 ENCOUNTER — Inpatient Hospital Stay (HOSPITAL_COMMUNITY): Payer: Medicare Other

## 2015-07-31 ENCOUNTER — Emergency Department (HOSPITAL_COMMUNITY): Payer: Medicare Other

## 2015-07-31 ENCOUNTER — Inpatient Hospital Stay (HOSPITAL_COMMUNITY)
Admission: EM | Admit: 2015-07-31 | Discharge: 2015-08-07 | DRG: 871 | Disposition: A | Discharge status: Hospice | Payer: Medicare Other | Attending: Internal Medicine | Admitting: Internal Medicine

## 2015-07-31 ENCOUNTER — Non-Acute Institutional Stay (SKILLED_NURSING_FACILITY): Payer: Medicare Other | Admitting: Internal Medicine

## 2015-07-31 DIAGNOSIS — Z7901 Long term (current) use of anticoagulants: Secondary | ICD-10-CM | POA: Diagnosis present

## 2015-07-31 DIAGNOSIS — Z8249 Family history of ischemic heart disease and other diseases of the circulatory system: Secondary | ICD-10-CM

## 2015-07-31 DIAGNOSIS — Z66 Do not resuscitate: Secondary | ICD-10-CM | POA: Diagnosis present

## 2015-07-31 DIAGNOSIS — E538 Deficiency of other specified B group vitamins: Secondary | ICD-10-CM | POA: Diagnosis present

## 2015-07-31 DIAGNOSIS — E1140 Type 2 diabetes mellitus with diabetic neuropathy, unspecified: Secondary | ICD-10-CM | POA: Diagnosis present

## 2015-07-31 DIAGNOSIS — I482 Chronic atrial fibrillation: Secondary | ICD-10-CM | POA: Diagnosis present

## 2015-07-31 DIAGNOSIS — Z833 Family history of diabetes mellitus: Secondary | ICD-10-CM

## 2015-07-31 DIAGNOSIS — Z881 Allergy status to other antibiotic agents status: Secondary | ICD-10-CM | POA: Diagnosis present

## 2015-07-31 DIAGNOSIS — I5032 Chronic diastolic (congestive) heart failure: Secondary | ICD-10-CM | POA: Diagnosis present

## 2015-07-31 DIAGNOSIS — I130 Hypertensive heart and chronic kidney disease with heart failure and stage 1 through stage 4 chronic kidney disease, or unspecified chronic kidney disease: Secondary | ICD-10-CM | POA: Diagnosis present

## 2015-07-31 DIAGNOSIS — G4700 Insomnia, unspecified: Secondary | ICD-10-CM | POA: Diagnosis present

## 2015-07-31 DIAGNOSIS — G253 Myoclonus: Secondary | ICD-10-CM | POA: Diagnosis present

## 2015-07-31 DIAGNOSIS — D72829 Elevated white blood cell count, unspecified: Secondary | ICD-10-CM | POA: Diagnosis present

## 2015-07-31 DIAGNOSIS — G9340 Encephalopathy, unspecified: Secondary | ICD-10-CM | POA: Diagnosis not present

## 2015-07-31 DIAGNOSIS — G92 Toxic encephalopathy: Secondary | ICD-10-CM | POA: Diagnosis present

## 2015-07-31 DIAGNOSIS — N390 Urinary tract infection, site not specified: Secondary | ICD-10-CM | POA: Diagnosis present

## 2015-07-31 DIAGNOSIS — I252 Old myocardial infarction: Secondary | ICD-10-CM

## 2015-07-31 DIAGNOSIS — R258 Other abnormal involuntary movements: Secondary | ICD-10-CM | POA: Diagnosis not present

## 2015-07-31 DIAGNOSIS — R8281 Pyuria: Secondary | ICD-10-CM

## 2015-07-31 DIAGNOSIS — R400 Somnolence: Secondary | ICD-10-CM | POA: Diagnosis not present

## 2015-07-31 DIAGNOSIS — R791 Abnormal coagulation profile: Secondary | ICD-10-CM | POA: Diagnosis present

## 2015-07-31 DIAGNOSIS — E119 Type 2 diabetes mellitus without complications: Secondary | ICD-10-CM

## 2015-07-31 DIAGNOSIS — Z86718 Personal history of other venous thrombosis and embolism: Secondary | ICD-10-CM

## 2015-07-31 DIAGNOSIS — Z888 Allergy status to other drugs, medicaments and biological substances status: Secondary | ICD-10-CM

## 2015-07-31 DIAGNOSIS — I481 Persistent atrial fibrillation: Secondary | ICD-10-CM | POA: Diagnosis present

## 2015-07-31 DIAGNOSIS — I6502 Occlusion and stenosis of left vertebral artery: Secondary | ICD-10-CM | POA: Diagnosis present

## 2015-07-31 DIAGNOSIS — F0390 Unspecified dementia without behavioral disturbance: Secondary | ICD-10-CM | POA: Diagnosis present

## 2015-07-31 DIAGNOSIS — A419 Sepsis, unspecified organism: Secondary | ICD-10-CM | POA: Diagnosis present

## 2015-07-31 DIAGNOSIS — Z515 Encounter for palliative care: Secondary | ICD-10-CM

## 2015-07-31 DIAGNOSIS — I2782 Chronic pulmonary embolism: Secondary | ICD-10-CM | POA: Diagnosis present

## 2015-07-31 DIAGNOSIS — B0229 Other postherpetic nervous system involvement: Secondary | ICD-10-CM | POA: Diagnosis present

## 2015-07-31 DIAGNOSIS — J690 Pneumonitis due to inhalation of food and vomit: Secondary | ICD-10-CM | POA: Diagnosis present

## 2015-07-31 DIAGNOSIS — I4891 Unspecified atrial fibrillation: Secondary | ICD-10-CM | POA: Diagnosis not present

## 2015-07-31 DIAGNOSIS — I471 Supraventricular tachycardia: Secondary | ICD-10-CM | POA: Diagnosis present

## 2015-07-31 DIAGNOSIS — E1122 Type 2 diabetes mellitus with diabetic chronic kidney disease: Secondary | ICD-10-CM | POA: Diagnosis present

## 2015-07-31 DIAGNOSIS — E118 Type 2 diabetes mellitus with unspecified complications: Secondary | ICD-10-CM

## 2015-07-31 DIAGNOSIS — N183 Chronic kidney disease, stage 3 (moderate): Secondary | ICD-10-CM | POA: Diagnosis present

## 2015-07-31 DIAGNOSIS — I161 Hypertensive emergency: Secondary | ICD-10-CM | POA: Diagnosis present

## 2015-07-31 DIAGNOSIS — E785 Hyperlipidemia, unspecified: Secondary | ICD-10-CM | POA: Diagnosis present

## 2015-07-31 DIAGNOSIS — H9190 Unspecified hearing loss, unspecified ear: Secondary | ICD-10-CM | POA: Diagnosis present

## 2015-07-31 DIAGNOSIS — I2699 Other pulmonary embolism without acute cor pulmonale: Secondary | ICD-10-CM | POA: Diagnosis present

## 2015-07-31 DIAGNOSIS — I10 Essential (primary) hypertension: Secondary | ICD-10-CM

## 2015-07-31 DIAGNOSIS — Z79899 Other long term (current) drug therapy: Secondary | ICD-10-CM

## 2015-07-31 DIAGNOSIS — R29810 Facial weakness: Secondary | ICD-10-CM

## 2015-07-31 DIAGNOSIS — G8929 Other chronic pain: Secondary | ICD-10-CM | POA: Diagnosis present

## 2015-07-31 DIAGNOSIS — R1031 Right lower quadrant pain: Secondary | ICD-10-CM | POA: Diagnosis present

## 2015-07-31 DIAGNOSIS — J189 Pneumonia, unspecified organism: Secondary | ICD-10-CM

## 2015-07-31 DIAGNOSIS — R000 Tachycardia, unspecified: Secondary | ICD-10-CM

## 2015-07-31 DIAGNOSIS — Z794 Long term (current) use of insulin: Secondary | ICD-10-CM

## 2015-07-31 DIAGNOSIS — A481 Legionnaires' disease: Secondary | ICD-10-CM | POA: Diagnosis present

## 2015-07-31 DIAGNOSIS — E43 Unspecified severe protein-calorie malnutrition: Secondary | ICD-10-CM

## 2015-07-31 LAB — COMPREHENSIVE METABOLIC PANEL
ALT: 29 U/L (ref 14–54)
ANION GAP: 8 (ref 5–15)
AST: 22 U/L (ref 15–41)
Albumin: 3.3 g/dL — ABNORMAL LOW (ref 3.5–5.0)
Alkaline Phosphatase: 74 U/L (ref 38–126)
BUN: 27 mg/dL — ABNORMAL HIGH (ref 6–20)
CALCIUM: 9.6 mg/dL (ref 8.9–10.3)
CHLORIDE: 103 mmol/L (ref 101–111)
CO2: 25 mmol/L (ref 22–32)
CREATININE: 1.35 mg/dL — AB (ref 0.44–1.00)
GFR, EST AFRICAN AMERICAN: 38 mL/min — AB (ref >60)
GFR, EST NON AFRICAN AMERICAN: 33 mL/min — AB (ref >60)
Glucose, Bld: 307 mg/dL — ABNORMAL HIGH (ref 65–99)
Potassium: 4.4 mmol/L (ref 3.5–5.1)
SODIUM: 136 mmol/L (ref 135–145)
Total Bilirubin: 0.7 mg/dL (ref 0.3–1.2)
Total Protein: 5.9 g/dL — ABNORMAL LOW (ref 6.5–8.1)

## 2015-07-31 LAB — CBC WITH DIFFERENTIAL/PLATELET
Basophils Absolute: 0 10*3/uL (ref 0.0–0.1)
Basophils Relative: 0 %
EOS ABS: 0.1 10*3/uL (ref 0.0–0.7)
Eosinophils Relative: 1 %
HCT: 35.6 % — ABNORMAL LOW (ref 36.0–46.0)
HEMOGLOBIN: 11.1 g/dL — AB (ref 12.0–15.0)
LYMPHS ABS: 0.6 10*3/uL — AB (ref 0.7–4.0)
Lymphocytes Relative: 6 %
MCH: 27.5 pg (ref 26.0–34.0)
MCHC: 31.2 g/dL (ref 30.0–36.0)
MCV: 88.3 fL (ref 78.0–100.0)
MONOS PCT: 10 %
Monocytes Absolute: 1 10*3/uL (ref 0.1–1.0)
NEUTROS ABS: 8.9 10*3/uL — AB (ref 1.7–7.7)
NEUTROS PCT: 83 %
PLATELETS: 169 10*3/uL (ref 150–400)
RBC: 4.03 MIL/uL (ref 3.87–5.11)
RDW: 16 % — ABNORMAL HIGH (ref 11.5–15.5)
WBC: 10.6 10*3/uL — AB (ref 4.0–10.5)

## 2015-07-31 LAB — PROTIME-INR
INR: 1.8 — ABNORMAL HIGH (ref 0.00–1.49)
INR: 1.94 — ABNORMAL HIGH (ref 0.00–1.49)
PROTHROMBIN TIME: 20.8 s — AB (ref 11.6–15.2)
Prothrombin Time: 22 seconds — ABNORMAL HIGH (ref 11.6–15.2)

## 2015-07-31 LAB — I-STAT CG4 LACTIC ACID, ED: LACTIC ACID, VENOUS: 1.13 mmol/L (ref 0.5–2.0)

## 2015-07-31 LAB — URINALYSIS, ROUTINE W REFLEX MICROSCOPIC
GLUCOSE, UA: 500 mg/dL — AB
Ketones, ur: NEGATIVE mg/dL
Nitrite: NEGATIVE
Protein, ur: 100 mg/dL — AB
SPECIFIC GRAVITY, URINE: 1.025 (ref 1.005–1.030)
pH: 5.5 (ref 5.0–8.0)

## 2015-07-31 LAB — GLUCOSE, CAPILLARY
GLUCOSE-CAPILLARY: 150 mg/dL — AB (ref 65–99)
GLUCOSE-CAPILLARY: 112 mg/dL — AB (ref 65–99)

## 2015-07-31 LAB — PROCALCITONIN: Procalcitonin: 0.36 ng/mL

## 2015-07-31 LAB — AMMONIA: AMMONIA: 11 umol/L (ref 9–35)

## 2015-07-31 LAB — APTT
aPTT: 48 seconds — ABNORMAL HIGH (ref 24–37)
aPTT: 46 seconds — ABNORMAL HIGH (ref 24–37)

## 2015-07-31 LAB — MRSA PCR SCREENING: MRSA BY PCR: NEGATIVE

## 2015-07-31 LAB — URINE MICROSCOPIC-ADD ON

## 2015-07-31 MED ORDER — POLYETHYLENE GLYCOL 3350 17 G PO PACK: 17.0000 g | PACK | Freq: Two times a day (BID) | ORAL | Status: DC

## 2015-07-31 MED ORDER — HYDRALAZINE HCL 20 MG/ML IJ SOLN
10.0000 mg | Freq: Once | INTRAMUSCULAR | Status: AC
  Administered 2015-07-31: 10 mg via INTRAVENOUS
  Filled 2015-07-31: qty 1

## 2015-07-31 MED ORDER — HEPARIN SODIUM (PORCINE) 5000 UNIT/ML IJ SOLN
5000.0000 [IU] | Freq: Three times a day (TID) | INTRAMUSCULAR | Status: DC
  Administered 2015-07-31 – 2015-08-01 (×3): 5000 [IU] via SUBCUTANEOUS
  Filled 2015-07-31 (×3): qty 1

## 2015-07-31 MED ORDER — PIPERACILLIN-TAZOBACTAM 3.375 G IVPB 30 MIN: 3.3750 g | Freq: Three times a day (TID) | INTRAVENOUS | Status: DC

## 2015-07-31 MED ORDER — CLONIDINE HCL 0.3 MG/24HR TD PTWK: 0.3000 mg | MEDICATED_PATCH | TRANSDERMAL | Status: DC

## 2015-07-31 MED ORDER — LABETALOL HCL 200 MG PO TABS: 400.0000 mg | ORAL_TABLET | Freq: Two times a day (BID) | ORAL | Status: DC

## 2015-07-31 MED ORDER — INSULIN ASPART 100 UNIT/ML ~~LOC~~ SOLN
0.0000 [IU] | Freq: Four times a day (QID) | SUBCUTANEOUS | Status: DC
  Administered 2015-08-02: 11 [IU] via SUBCUTANEOUS

## 2015-07-31 MED ORDER — PANTOPRAZOLE SODIUM 40 MG PO TBEC
40.0000 mg | DELAYED_RELEASE_TABLET | Freq: Every day | ORAL | Status: DC
  Administered 2015-08-02 – 2015-08-07 (×6): 40 mg via ORAL
  Filled 2015-07-31 (×5): qty 1

## 2015-07-31 MED ORDER — ACETAMINOPHEN 325 MG PO TABS
650.0000 mg | ORAL_TABLET | ORAL | Status: DC | PRN
  Filled 2015-07-31 (×2): qty 2

## 2015-07-31 MED ORDER — PIPERACILLIN-TAZOBACTAM 3.375 G IVPB
3.3750 g | Freq: Three times a day (TID) | INTRAVENOUS | Status: DC
  Administered 2015-07-31 – 2015-08-02 (×5): 3.375 g via INTRAVENOUS
  Filled 2015-07-31 (×7): qty 50

## 2015-07-31 MED ORDER — ASPIRIN 325 MG PO TABS: 325.0000 mg | ORAL_TABLET | Freq: Every day | ORAL | Status: DC

## 2015-07-31 MED ORDER — VANCOMYCIN HCL 10 G IV SOLR
1250.0000 mg | Freq: Once | INTRAVENOUS | Status: DC
  Filled 2015-07-31: qty 1250

## 2015-07-31 MED ORDER — INSULIN ASPART 100 UNIT/ML ~~LOC~~ SOLN: 0.0000 [IU] | Freq: Four times a day (QID) | SUBCUTANEOUS | Status: DC

## 2015-07-31 MED ORDER — SODIUM CHLORIDE 0.9 % IV BOLUS (SEPSIS)
500.0000 mL | Freq: Once | INTRAVENOUS | Status: AC
  Administered 2015-07-31: 500 mL via INTRAVENOUS

## 2015-07-31 MED ORDER — WARFARIN - PHARMACIST DOSING INPATIENT: Freq: Every day | Status: DC

## 2015-07-31 MED ORDER — WARFARIN SODIUM 3 MG PO TABS
3.0000 mg | ORAL_TABLET | Freq: Once | ORAL | Status: DC
  Filled 2015-07-31: qty 1

## 2015-07-31 MED ORDER — LABETALOL HCL 200 MG PO TABS
400.0000 mg | ORAL_TABLET | Freq: Two times a day (BID) | ORAL | Status: DC
  Administered 2015-08-02: 400 mg via ORAL
  Filled 2015-07-31: qty 2

## 2015-07-31 MED ORDER — ASPIRIN 300 MG RE SUPP: 300.0000 mg | Freq: Every day | RECTAL | Status: DC

## 2015-07-31 MED ORDER — PIPERACILLIN-TAZOBACTAM 3.375 G IVPB 30 MIN
3.3750 g | Freq: Once | INTRAVENOUS | Status: AC
  Administered 2015-07-31: 3.375 g via INTRAVENOUS
  Filled 2015-07-31: qty 50

## 2015-07-31 MED ORDER — POLYETHYLENE GLYCOL 3350 17 G PO PACK
17.0000 g | PACK | Freq: Two times a day (BID) | ORAL | Status: DC
  Administered 2015-08-02 – 2015-08-06 (×6): 17 g via ORAL
  Filled 2015-07-31 (×6): qty 1

## 2015-07-31 MED ORDER — SODIUM CHLORIDE 0.9 % IV BOLUS (SEPSIS): 1000.0000 mL | Freq: Once | INTRAVENOUS | Status: DC

## 2015-07-31 MED ORDER — STROKE: EARLY STAGES OF RECOVERY BOOK
Freq: Once | Status: DC
  Filled 2015-07-31: qty 1

## 2015-07-31 MED ORDER — ACETAMINOPHEN 650 MG RE SUPP
650.0000 mg | RECTAL | Status: DC | PRN
  Administered 2015-07-31: 650 mg via RECTAL
  Filled 2015-07-31: qty 1

## 2015-07-31 MED ORDER — BENZTROPINE MESYLATE 0.5 MG PO TABS
0.5000 mg | ORAL_TABLET | Freq: Every day | ORAL | Status: DC
  Administered 2015-08-02 – 2015-08-07 (×5): 0.5 mg via ORAL
  Filled 2015-07-31 (×7): qty 1

## 2015-07-31 MED ORDER — PRAVASTATIN SODIUM 20 MG PO TABS: 20.0000 mg | ORAL_TABLET | Freq: Every day | ORAL | Status: DC

## 2015-07-31 MED ORDER — CLONIDINE HCL 0.3 MG/24HR TD PTWK
0.3000 mg | MEDICATED_PATCH | TRANSDERMAL | Status: DC
  Administered 2015-08-01: 0.3 mg via TRANSDERMAL
  Filled 2015-07-31: qty 1

## 2015-07-31 MED ORDER — PRAVASTATIN SODIUM 20 MG PO TABS
20.0000 mg | ORAL_TABLET | Freq: Every day | ORAL | Status: DC
  Administered 2015-08-02 – 2015-08-04 (×3): 20 mg via ORAL
  Filled 2015-07-31 (×3): qty 1

## 2015-07-31 NOTE — H&P (Signed)
[facility] History and Physical  Patient: Lindsay Sheppard  MRN: [id number]  DOB: 1920/11/02  DOS: the patient was seen and examined on 07/31/2015 PCP: [name], MD  Referring physician: Dr. Laneta Simmers Chief Complaint: Confusion  HPI: Lindsay Sheppard is a 81 y.o. 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 The patient presented with complains of worsening of confusion starting yesterday. The patient is a resident at [facility] placed skilled nursing facility. Patient was recently hospitalized for acute encephalopathy which was thought to be secondary to pneumonia and UTI and was discharged on Augmentin to a skilled nursing facility. She was switched to ceftriaxone at the facility. In the facility the patient was found to be having insomnia and was started on Remeron. She was also started on fluconazole due to fungal infection for 3 days. She was found to be hypernatremic with a sodium of 150 which improved with treatment. Patient was progressing well until yesterday. As per the family yesterday the patient started having complaints of confusion she was also having hallucination and was trying to pick things from the air.  A limited in the morning the patient was not responding to verbal stimuli and was having jerking body movements. There was a question of possible seizure-like event as well. There was no nausea vomiting or diarrhea reported. Other than above no other change in medications reported. Patient did not have any fever or chills. She has a chronic Foley catheter. She has been on dysphagia diet. At her baseline when she is stable she is able to feed herself, take her medications herself, assist in clothing as well as bathing and is generally awake throughout the day.  The patient is coming from SNF.  At her baseline ambulates with  support And is dependent for most of her ADL; does not manages her medication on her own.  Review of Systems: as mentioned in the history of present illness.  A comprehensive review of the other systems is negative.  Past Medical History  Diagnosis Date  . POSTHERPETIC NEURALGIA 11/02/2009  . B12 DEFICIENCY 05/23/2007  . HYPERLIPIDEMIA 05/23/2007  . HYPERKALEMIA 05/30/2010  . ANEMIA-IRON DEFICIENCY 01/26/2007  . BLEPHARITIS, BILATERAL 02/23/2008  . Other specified forms of hearing loss 05/30/2010  . HYPERTENSION 01/26/2007  . Atrial fibrillation ([place]) 05/23/2007  . DIASTOLIC HEART FAILURE, CHRONIC 12/14/2009  . POSTURAL HYPOTENSION 02/07/2008  . ALLERGIC RHINITIS 11/02/2009  . GERD 01/26/2007  . CONSTIPATION 01/25/2008  . SKIN LESION 05/30/2010  . BACK PAIN 08/02/2007  . OSTEOPOROSIS 05/23/2007  . SYNCOPE 02/07/2008  . FATIGUE 09/06/2007  . Dysuria 09/05/2008  . Abdominal pain, epigastric 09/06/2007  . EPIGASTRIC TENDERNESS 10/12/2007  . PULMONARY EMBOLISM, HX OF 05/23/2007  . SHINGLES, HX OF 05/23/2007  . 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 Left foot pain   . Diabetic neuropathy ([place])   . RENAL INSUFFICIENCY 07/02/2009  . RENAL CYST, LEFT 09/06/2007  . Kidney stones     "never had OR"  . Family history of anesthesia complication     "daughter has PONV; another daughter a little anesthesia lasts way too long"  . CHF (congestive heart failure) ([place]) 2011  . Myocardial infarction [facility]) 2011    "mild"  . DVT (deep venous thrombosis) ([place]) ~ 1964    "think it was in the left"  . Aspiration pneumonia ([place]) ~ 2006    "due to aspiration"  .  DIABETES MELLITUS, TYPE II 09/06/2007  . DEPRESSIVE DISORDER 01/25/2008    "@ times; never treated for it"  . Melanoma of nose ([place])   . Compression fracture of lumbar vertebra ([facility])     hx  . Compression fracture of thoracic vertebra ([place]) 02/07/2014    "fell this am" (02/07/2014  . Arthritis     "hands" (03/15/2014)  . Foley catheter in place on admission    Past  Surgical History  Procedure Laterality Date  . Appendectomy  1943  . Abdominal hysterectomy  1964  . Cardiac electrophysiology mapping and ablation  1999  . Cataract extraction w/ intraocular lens  implant, bilateral  2003-2005  . Carpal tunnel release Right 2004  . Mohs surgery  2011    "removed off her nose"  . Cardiac catheterization  10/2009  . Esophageal dilation  X 2   Social History:  reports that she has never smoked. She has never used smokeless tobacco. She reports that she does not drink alcohol or use illicit drugs.  Allergies  Allergen Reactions  . Amiodarone Other (See Comments)    REACTION: f? fluid retention  . Deltasone [Prednisone] Other (See Comments)    unknown  . Diltiazem Hcl Other (See Comments)    unknown  . Levofloxacin Nausea And Vomiting    REACTION: nausea  . Pregabalin Other (See Comments)    REACTION: hallucinations  . Spironolactone Other (See Comments)    REACTION: elevated K at lowest dose  . Tequin [Gatifloxacin] Other (See Comments)    Hypoglycemia  . Diazepam Anxiety and Other (See Comments)    REACTION: agitation    Family History  Problem Relation Age of Onset  . Breast cancer Mother   . Colon cancer Father   . Stroke Father     died with stroke postop  . Diabetes Sister     2 sisters  . Hypertension Other     Prior to Admission medications   Medication Sig Start Date End Date Taking? Authorizing Provider  acetaminophen (TYLENOL) 325 MG tablet Take 2 tablets (650 mg total) by mouth 3 (three) times daily. 07/23/15  Yes Lauree Chandler, NP  AMBULATORY NON FORMULARY MEDICATION Medication Name: Med pass 120 cc by mouth two times daily in between meals   Yes Historical Provider, MD  benztropine (COGENTIN) 0.5 MG tablet Take 0.5 mg by mouth daily. For involuntary jerks   Yes Historical Provider, MD  cloNIDine (CATAPRES - DOSED IN MG/24 HR) 0.3 mg/24hr patch Place 0.3 mg onto the skin once a week. Wednesdays   Yes Historical Provider,  MD  CRANBERRY PO Take 500 mg by mouth daily.    Yes Historical Provider, MD  cyanocobalamin (,VITAMIN B-12,) 1000 MCG/ML injection INJECT 1 ML INTO THE MUSCLE EVERY 30 DAYS 04/26/15  Yes Biagio Borg, MD  ferrous sulfate 325 (65 FE) MG tablet Take 1 tablet (325 mg total) by mouth 2 (two) times daily with a meal. 02/15/14  Yes Ivan Anchors Love, PA-C  furosemide (LASIX) 20 MG tablet 1 tab by mouth per day as needed for swelling Patient taking differently: Take 20 mg by mouth daily as needed for fluid or edema.  12/05/14  Yes Biagio Borg, MD  hydrALAZINE (APRESOLINE) 25 MG tablet Take 1 tablet (25 mg total) by mouth 3 (three) times daily. 12/13/14  Yes Biagio Borg, MD  hydroxypropyl methylcellulose / hypromellose (ISOPTO TEARS / GONIOVISC) 2.5 % ophthalmic solution Place 1 drop into both eyes 3 (three) times  daily as needed for dry eyes.   Yes Historical Provider, MD  insulin glargine (LANTUS) 100 UNIT/ML injection Inject 0.2 mLs (20 Units total) into the skin daily. 03/06/15  Yes Biagio Borg, MD  labetalol (NORMODYNE) 200 MG tablet Take 400 mg by mouth 2 (two) times daily.   Yes Historical Provider, MD  pantoprazole (PROTONIX) 40 MG tablet TAKE 1 BY MOUTH DAILY Patient taking differently: Take one tablet by mouth once daily for stomach 09/12/14  Yes Biagio Borg, MD  polyethylene glycol [facility] Floria Raveling) packet Take 17 g by mouth 2 (two) times daily. 07/23/15  Yes Lauree Chandler, NP  pravastatin (PRAVACHOL) 20 MG tablet Take 20 mg by mouth daily at 6 PM.    Yes Historical Provider, MD  tuberculin (TUBERSOL) 5 UNIT/0.1ML injection Inject 5 Units into the skin once.   Yes Historical Provider, MD  warfarin (COUMADIN) 2 MG tablet Take 2 mg by mouth daily at 6 PM.   Yes Historical Provider, MD  warfarin (COUMADIN) 1 MG tablet 1.5 mg daily recheck PT/INW 1 week on 07/30/15 Patient not taking: Reported on 07/31/2015 07/23/15   Lauree Chandler, NP    Physical Exam: Filed Vitals:   07/31/15 1300 07/31/15  1330 07/31/15 1430 07/31/15 1600  BP: 181/95 180/84 172/94   Pulse: 92 93 95   Temp:    99.5 F (37.5 C)  TempSrc:    Rectal  Resp: 21 19 20    Height:      Weight:      SpO2: 98% 100% 98%     General: Alert, Awake and not oriented. Appear in moderate distress Not following command Eyes: PERRL ENT: Oral Mucosa clear dry. Neck: No JVD Cardiovascular: S1 and S2 Present, aortic systolic Murmur, Peripheral Pulses Present Respiratory: Bilateral Air entry equal and Decreased,  Clear to Auscultation, no Crackles, no wheezes Abdomen: Bowel Sound present, Soft and non tenderness Skin: Diffuse ecchymosis present on admission gradually healing Extremities: no Pedal edema, no calf tenderness Neurologic: Mental status AA not oriented, incoherent speech, attention limited, Cranial Nerves PERRL, EOM normal and present,  Motor strength bilateral equal strength 3/5 Sensation present to painful stimuli Reflexes present knee and biceps, babinski equivocal,  Cerebellar test unable to perform  Labs on Admission:  CBC:  Recent Labs Lab 07/31/15 1248  WBC 10.6*  NEUTROABS 8.9*  HGB 11.1*  HCT 35.6*  MCV 88.3  PLT 169    CMP     Component Value Date/Time   NA 136 07/31/2015 1248   NA 146 07/16/2015   K 4.4 07/31/2015 1248   CL 103 07/31/2015 1248   CO2 25 07/31/2015 1248   GLUCOSE 307* 07/31/2015 1248   BUN 27* 07/31/2015 1248   BUN 24* 07/16/2015   CREATININE 1.35* 07/31/2015 1248   CREATININE 1.0 07/16/2015   CALCIUM 9.6 07/31/2015 1248   PROT 5.9* 07/31/2015 1248   ALBUMIN 3.3* 07/31/2015 1248   AST 22 07/31/2015 1248   ALT 29 07/31/2015 1248   ALKPHOS 74 07/31/2015 1248   BILITOT 0.7 07/31/2015 1248   GFRNONAA 33* 07/31/2015 1248   GFRAA 38* 07/31/2015 1248    No results for input(s): CKTOTAL, CKMB, CKMBINDEX, TROPONINI in the last 168 hours. BNP (last 3 results) No results for input(s): BNP in the last 8760 hours.  ProBNP (last 3 results) No results for input(s):  PROBNP in the last 8760 hours.   Radiological Exams on Admission: Ct Head Wo Contrast  07/31/2015  CLINICAL DATA:  Altered mental status; lethargic EXAM: CT HEAD WITHOUT CONTRAST TECHNIQUE: Contiguous axial images were obtained from the base of the skull through the vertex without intravenous contrast. COMPARISON:  July 06, 2015 FINDINGS: Mild diffuse atrophy is stable. There is no intracranial mass, hemorrhage, extra-axial fluid collection, or midline shift. There is rather minimal small vessel disease in the centra semiovale bilaterally. Elsewhere gray-white compartments appear normal. No acute infarct is evident. Bony calvarium appears intact. The mastoid air cells are clear. There are no intraorbital lesions appreciable. IMPRESSION: Mild atrophy with rather minimal periventricular small vessel disease, stable. No acute infarct evident. No hemorrhage or mass effect. Electronically Signed   By: Lowella Grip III M.D.   On: 07/31/2015 14:04   Dg Chest Port 1 View  07/31/2015  CLINICAL DATA:  81 year old presenting with acute mental status changes and lethargy. EXAM: PORTABLE CHEST 1 VIEW COMPARISON:  07/06/2015 and earlier. FINDINGS: Airspace consolidation in the left lower lobe with silhouetting the left hemidiaphragm, similar in appearance to the most recent examination 3+ weeks ago. No new pulmonary parenchymal abnormalities elsewhere. Cardiac silhouette mildly to moderately enlarged, unchanged. Mild pulmonary venous hypertension without overt edema. IMPRESSION: 1. Left lower lobe atelectasis and/or pneumonia, not significantly changed since 07/06/2015. 2. No new abnormalities. Electronically Signed   By: Evangeline Dakin M.D.   On: 07/31/2015 13:20   Assessment/Plan 1. Sepsis secondary to UTI [facility]) The pt meets SIRS criteria Tachycardia heart rate ranging from 90 to 130 Tachypnea Mild Leucocytosis. With too numerous to count WBC in the urine suggesting possible UTI and presenting with  acute encephalopathy. The pt has evidence of source of infection in urine She is on room air, Platelet count 169, Bilirubin 0.7, MAP 110, GCS 8, S.Creatinine 1.3 Suggesting SOFA score of 4. With this the patient will be admitted to the hospital. She'll be treated with IV Zosyn. We will monitor her in stepdown unit. We'll hydrate her with IV fluids. Follow the cultures  2. Atrial fibrillation, RVR,. The patient has altered ranging from 9230. At present continue to closely monitor. Next and continue clonidine patch. Currently on heparin for DVT prophylaxis and the continue with warfarin after speech therapy evaluation in the morning.  3. Chronic diastolic heart failure. Essential hypertension. Next and blood pressure elevated. We will continue clonidine patch.  4. Acute encephalopathy. Discussed with the neurology on the phone and they will consult on the patient. CT scan of the head is negative. Neck supple check MRI of the brain as well as EEG. Check pro calcitonin level as well as prolactin level.  5. Chronic Foley catheter. We'll continue close monitoring. Change the Foley catheter.  6. Diabetes mellitus type 2.  holding home medications and placing the patient on sliding scale insulin.  7. Myoclonic jerking. Neurologic currently unclear. Probably medication side effect versus intracranial pathology. Neck supple versus infection. We will continue close monitoring.  Nutrition: Nothing by mouth DVT Prophylaxis: subcutaneous Heparin  Advance goals of care discussion: DNR/DNI as per my discussion with family   Consults: Neurology  Family Communication: family was present at bedside, opportunity was given to ask question and all questions were answered satisfactorily at the time of interview. Disposition: Admitted as inpatient, step-down unit.  Author: Berle Mull, MD [facility] Pager: [phone] 07/31/2015  If 7PM-7AM, please contact  night-coverage [email or web address] Password [email or web address]

## 2015-07-31 NOTE — ED Provider Notes (Signed)
CSN: [id number]     Arrival date & time 07/31/15  1214 History   First MD Initiated Contact with Patient 07/31/15 1217     Chief Complaint  Patient presents with  . Altered Mental Status     (Consider location/radiation/quality/duration/timing/severity/associated sxs/prior Treatment) Patient is a 81 y.o. female presenting with altered mental status. The history is provided by a relative.  Altered Mental Status Presenting symptoms: confusion, disorientation and lethargy   Severity:  Severe Most recent episode:  Today Episode history:  Continuous Duration:  1 day Timing:  Constant Progression:  Worsening Chronicity:  New Context: nursing home resident   Associated symptoms: abnormal movement, decreased appetite, hallucinations, slurred speech and weakness   Associated symptoms: no fever     Past Medical History  Diagnosis Date  . POSTHERPETIC NEURALGIA 11/02/2009  . B12 DEFICIENCY 05/23/2007  . HYPERLIPIDEMIA 05/23/2007  . HYPERKALEMIA 05/30/2010  . ANEMIA-IRON DEFICIENCY 01/26/2007  . BLEPHARITIS, BILATERAL 02/23/2008  . Other specified forms of hearing loss 05/30/2010  . HYPERTENSION 01/26/2007  . Atrial fibrillation ([place]) 05/23/2007  . DIASTOLIC HEART FAILURE, CHRONIC 12/14/2009  . POSTURAL HYPOTENSION 02/07/2008  . ALLERGIC RHINITIS 11/02/2009  . GERD 01/26/2007  . CONSTIPATION 01/25/2008  . SKIN LESION 05/30/2010  . BACK PAIN 08/02/2007  . OSTEOPOROSIS 05/23/2007  . SYNCOPE 02/07/2008  . FATIGUE 09/06/2007  . Dysuria 09/05/2008  . Abdominal pain, epigastric 09/06/2007  . EPIGASTRIC TENDERNESS 10/12/2007  . PULMONARY EMBOLISM, HX OF 05/23/2007  . SHINGLES, HX OF 05/23/2007  . 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 Left foot pain   . Diabetic neuropathy ([place])   . RENAL INSUFFICIENCY 07/02/2009  . RENAL CYST, LEFT 09/06/2007  . Kidney stones     "never had OR"  . Family history of anesthesia complication     "daughter has PONV; another daughter a little anesthesia lasts way too long"  . CHF  (congestive heart failure) ([place]) 2011  . Myocardial infarction [facility]) 2011    "mild"  . DVT (deep venous thrombosis) ([place]) ~ 1964    "think it was in the left"  . Aspiration pneumonia ([place]) ~ 2006    "due to aspiration"  . DIABETES MELLITUS, TYPE II 09/06/2007  . DEPRESSIVE DISORDER 01/25/2008    "@ times; never treated for it"  . Melanoma of nose ([place])   . Compression fracture of lumbar vertebra ([facility])     hx  . Compression fracture of thoracic vertebra ([place]) 02/07/2014    "fell this am" (02/07/2014  . Arthritis     "hands" (03/15/2014)  . Foley catheter in place on admission    Past Surgical History  Procedure Laterality Date  . Appendectomy  1943  . Abdominal hysterectomy  1964  . Cardiac electrophysiology mapping and ablation  1999  . Cataract extraction w/ intraocular lens  implant, bilateral  2003-2005  . Carpal tunnel release Right 2004  . Mohs surgery  2011    "removed off her nose"  . Cardiac catheterization  10/2009  . Esophageal dilation  X 2   Family History  Problem Relation Age of Onset  . Breast cancer Mother   . Colon cancer Father   . Stroke Father     died with stroke postop  . Diabetes Sister     2 sisters  . Hypertension Other    Social History  Substance Use Topics  . Smoking status: Never Smoker   . Smokeless tobacco: Never Used  . Alcohol Use: No   OB History  No data available     Review of Systems  Constitutional: Positive for decreased appetite. Negative for fever.  Neurological: Positive for weakness.  Psychiatric/Behavioral: Positive for hallucinations and confusion.  All other systems reviewed and are negative.     Allergies  Amiodarone; Deltasone; Diltiazem hcl; Levofloxacin; Pregabalin; Spironolactone; Tequin; and Diazepam  Home Medications   Prior to Admission medications   Medication Sig Start Date End Date Taking? Authorizing Provider  ACCU-CHEK COMPACT PLUS test strip  12/19/14   Historical Provider, MD  acetaminophen  (TYLENOL) 325 MG tablet Take 2 tablets (650 mg total) by mouth 3 (three) times daily. 07/23/15   Lauree Chandler, NP  AMBULATORY NON FORMULARY MEDICATION Medication Name: Med pass 120 cc by mouth two times daily in between meals    Historical Provider, MD  amoxicillin-clavulanate (AUGMENTIN) 875-125 MG tablet Take 1 tablet by mouth 2 (two) times daily. X 10 days, begin 07/14/15, end 07/2215    Historical Provider, MD  B-D INS SYRINGE 0.5CC/31GX5/16 31G X 5/16" 0.5 ML MISC  02/06/15   Historical Provider, MD  benztropine (COGENTIN) 0.5 MG tablet Take 0.5 mg by mouth daily. For involuntary jerks    Historical Provider, MD  cloNIDine (CATAPRES - DOSED IN MG/24 HR) 0.3 mg/24hr patch Place 0.3 mg onto the skin once a week.    Historical Provider, MD  CRANBERRY PO Take 500 mg by mouth daily.     Historical Provider, MD  cyanocobalamin (,VITAMIN B-12,) 1000 MCG/ML injection INJECT 1 ML INTO THE MUSCLE EVERY 30 DAYS 04/26/15   Biagio Borg, MD  ferrous sulfate 325 (65 FE) MG tablet Take 1 tablet (325 mg total) by mouth 2 (two) times daily with a meal. 02/15/14   Ivan Anchors Love, PA-C  fluticasone (FLONASE) 50 MCG/ACT nasal spray Place 2 sprays into the nose daily for allergies    Historical Provider, MD  furosemide (LASIX) 20 MG tablet 1 tab by mouth per day as needed for swelling 12/05/14   Biagio Borg, MD  hydrALAZINE (APRESOLINE) 25 MG tablet Take 1 tablet (25 mg total) by mouth 3 (three) times daily. 12/13/14   Biagio Borg, MD  Hypromellose (ARTIFICIAL TEARS OP) Place 1 drop into both eyes daily as needed. For dry eyes    Historical Provider, MD  insulin glargine (LANTUS) 100 UNIT/ML injection Inject 0.2 mLs (20 Units total) into the skin daily. 03/06/15   Biagio Borg, MD  labetalol (NORMODYNE) 200 MG tablet Take 400 mg by mouth 2 (two) times daily.    Historical Provider, MD  pantoprazole (PROTONIX) 40 MG tablet TAKE 1 BY MOUTH DAILY Patient taking differently: Take one tablet by mouth once daily for stomach  09/12/14   Biagio Borg, MD  polyethylene glycol [facility] Raveling) packet Take 17 g by mouth 2 (two) times daily. 07/23/15   Lauree Chandler, NP  pravastatin (PRAVACHOL) 20 MG tablet Take 20 mg by mouth daily.    Historical Provider, MD  warfarin (COUMADIN) 1 MG tablet 1.5 mg daily recheck PT/INW 1 week on 07/30/15 07/23/15   Lauree Chandler, NP   BP 172/94 mmHg  Pulse 95  Temp(Src) 98.2 F (36.8 C) (Oral)  Resp 20  Ht 5\' 5"  (1.651 m)  Wt 140 lb (63.504 kg)  BMI 23.30 kg/m2  SpO2 98% Physical Exam  Constitutional: She appears lethargic. She appears ill.  HENT:  Head: Normocephalic and atraumatic.  Eyes: Conjunctivae are normal.  Neck: Neck supple. No tracheal deviation present.  Cardiovascular: An irregularly irregular rhythm present. Tachycardia present.   Pulmonary/Chest: Effort normal. No respiratory distress.  Abdominal: Soft. She exhibits no distension.  Neurological: She appears lethargic. She is disoriented. No cranial nerve deficit. GCS eye subscore is 2. GCS verbal subscore is 4. GCS motor subscore is 6.  Skin: Skin is warm and dry.    ED Course  Procedures (including critical care time) Labs Review Labs Reviewed  COMPREHENSIVE METABOLIC PANEL - Abnormal; Notable for the following:    Glucose, Bld 307 (*)    BUN 27 (*)    Creatinine, Ser 1.35 (*)    Total Protein 5.9 (*)    Albumin 3.3 (*)    GFR calc non Af Amer 33 (*)    GFR calc Af Amer 38 (*)    All other components within normal limits  CBC WITH DIFFERENTIAL/PLATELET - Abnormal; Notable for the following:    WBC 10.6 (*)    Hemoglobin 11.1 (*)    HCT 35.6 (*)    RDW 16.0 (*)    Neutro Abs 8.9 (*)    Lymphs Abs 0.6 (*)    All other components within normal limits  URINALYSIS, ROUTINE W REFLEX MICROSCOPIC (NOT AT [facility]) - Abnormal; Notable for the following:    Color, Urine AMBER (*)    APPearance CLOUDY (*)    Glucose, UA 500 (*)    Hgb urine dipstick MODERATE (*)    Bilirubin Urine SMALL (*)     Protein, ur 100 (*)    Leukocytes, UA LARGE (*)    All other components within normal limits  PROTIME-INR - Abnormal; Notable for the following:    Prothrombin Time 20.8 (*)    INR 1.80 (*)    All other components within normal limits  APTT - Abnormal; Notable for the following:    aPTT 48 (*)    All other components within normal limits  URINE MICROSCOPIC-ADD ON - Abnormal; Notable for the following:    Squamous Epithelial / LPF 0-5 (*)    Bacteria, UA MANY (*)    All other components within normal limits  PROTIME-INR - Abnormal; Notable for the following:    Prothrombin Time 22.0 (*)    INR 1.94 (*)    All other components within normal limits  APTT - Abnormal; Notable for the following:    aPTT 46 (*)    All other components within normal limits  CULTURE, BLOOD (ROUTINE X 2)  CULTURE, BLOOD (ROUTINE X 2)  URINE CULTURE  AMMONIA  PROCALCITONIN  HEMOGLOBIN A1C  LIPID PANEL  PROTIME-INR  CBC  BASIC METABOLIC PANEL  PROLACTIN  CBG MONITORING, ED  I-STAT CG4 LACTIC ACID, ED    Imaging Review Ct Head Wo Contrast  07/31/2015  CLINICAL DATA:  Altered mental status; lethargic EXAM: CT HEAD WITHOUT CONTRAST TECHNIQUE: Contiguous axial images were obtained from the base of the skull through the vertex without intravenous contrast. COMPARISON:  July 06, 2015 FINDINGS: Mild diffuse atrophy is stable. There is no intracranial mass, hemorrhage, extra-axial fluid collection, or midline shift. There is rather minimal small vessel disease in the centra semiovale bilaterally. Elsewhere gray-white compartments appear normal. No acute infarct is evident. Bony calvarium appears intact. The mastoid air cells are clear. There are no intraorbital lesions appreciable. IMPRESSION: Mild atrophy with rather minimal periventricular small vessel disease, stable. No acute infarct evident. No hemorrhage or mass effect. Electronically Signed   By: Lowella Grip III M.D.   On: 07/31/2015 14:04  Dg  Chest Port 1 View  07/31/2015  CLINICAL DATA:  81 year old presenting with acute mental status changes and lethargy. EXAM: PORTABLE CHEST 1 VIEW COMPARISON:  07/06/2015 and earlier. FINDINGS: Airspace consolidation in the left lower lobe with silhouetting the left hemidiaphragm, similar in appearance to the most recent examination 3+ weeks ago. No new pulmonary parenchymal abnormalities elsewhere. Cardiac silhouette mildly to moderately enlarged, unchanged. Mild pulmonary venous hypertension without overt edema. IMPRESSION: 1. Left lower lobe atelectasis and/or pneumonia, not significantly changed since 07/06/2015. 2. No new abnormalities. Electronically Signed   By: Evangeline Dakin M.D.   On: 07/31/2015 13:20   I have personally reviewed and evaluated these images and lab results as part of my medical decision-making.   EKG Interpretation None      MDM   Final diagnoses:  Tachycardia  Leukocytosis  HCAP (healthcare-associated pneumonia)  Sepsis, due to unspecified organism [facility])  Pyuria    81 y.o. female presents with increased confusion from baseline recurrently, last seen normal yesterday. Patient was recently admitted to the hospital with similar symptoms. Today on arrival patient is minimally responsive, intermittently tachycardic with persistent atrial fibrillation. She has a subtherapeutic Coumadin level and per the family had elevated sodium levels noted on recent blood draws. Neurologic exam is nonfocal but generally slowed, patient continues to be disoriented. Today she has an elevation in her white blood cell count, persistent pyuria concerning for potential source of infection as well as persistent sign of possible pneumonia which could represent sepsis in this elderly patient. Discussed CODE STATUS with family who agree with making patient DO NOT RESUSCITATE during this hospitalization due to likely poor outcome of cardiac arrest. Hospitalist was consulted for admission and will  see the patient in the emergency department.     Leo Grosser, MD 07/31/15 [phone]

## 2015-07-31 NOTE — Progress Notes (Signed)
Patient ID: Lindsay Sheppard, female   DOB: 15-Nov-1920, 81 y.o.   MRN: [id number]      [facility]  PCP: [name], MD    Code Status: Full Code  Allergies  Allergen Reactions  . Amiodarone Other (See Comments)    REACTION: f? fluid retention  . Deltasone [Prednisone] Other (See Comments)    unknown  . Diltiazem Hcl Other (See Comments)    unknown  . Levofloxacin Nausea And Vomiting    REACTION: nausea  . Pregabalin Other (See Comments)    REACTION: hallucinations  . Spironolactone Other (See Comments)    REACTION: elevated K at lowest dose  . Tequin [Gatifloxacin] Other (See Comments)    Hypoglycemia  . Diazepam Anxiety and Other (See Comments)    REACTION: agitation    Chief Complaint  Patient presents with  . Acute Visit    change in mental status     HPI:  81 y.o. patient is seen for acute visit. Nursing staff noted her to be difficult to wake up this morning. Daughter and grand daughter at bedside and mention that she appears very weak and tired today. She was feeding herself yesterday and participating in conversation. She is sitting up in wheelchair and is unable to participate in HPI and ROS at present. she is here for rehabilitation for her deconditioning. She is currently completed treatment for yeast cystitis. Her po intake has been poor today. Family noted her to have jerking movement with repetition few minutes back. Denies any frothing or loss of consciousness. She has PMH of HTN, DM, PE/DVT, Chronic foley catheter, gerd among others.   Review of Systems:  Unable to obtain   Past Medical History  Diagnosis Date  . POSTHERPETIC NEURALGIA 11/02/2009  . B12 DEFICIENCY 05/23/2007  . HYPERLIPIDEMIA 05/23/2007  . HYPERKALEMIA 05/30/2010  . ANEMIA-IRON DEFICIENCY 01/26/2007  . BLEPHARITIS, BILATERAL 02/23/2008  . Other specified forms of hearing loss 05/30/2010  . HYPERTENSION 01/26/2007  . Atrial fibrillation ([place]) 05/23/2007  . DIASTOLIC HEART FAILURE,  CHRONIC 12/14/2009  . POSTURAL HYPOTENSION 02/07/2008  . ALLERGIC RHINITIS 11/02/2009  . GERD 01/26/2007  . CONSTIPATION 01/25/2008  . SKIN LESION 05/30/2010  . BACK PAIN 08/02/2007  . OSTEOPOROSIS 05/23/2007  . SYNCOPE 02/07/2008  . FATIGUE 09/06/2007  . Dysuria 09/05/2008  . Abdominal pain, epigastric 09/06/2007  . EPIGASTRIC TENDERNESS 10/12/2007  . PULMONARY EMBOLISM, HX OF 05/23/2007  . SHINGLES, HX OF 05/23/2007  . 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 Left foot pain   . Diabetic neuropathy ([place])   . RENAL INSUFFICIENCY 07/02/2009  . RENAL CYST, LEFT 09/06/2007  . Kidney stones     "never had OR"  . Family history of anesthesia complication     "daughter has PONV; another daughter a little anesthesia lasts way too long"  . CHF (congestive heart failure) ([place]) 2011  . Myocardial infarction [facility]) 2011    "mild"  . DVT (deep venous thrombosis) ([place]) ~ 1964    "think it was in the left"  . Aspiration pneumonia ([place]) ~ 2006    "due to aspiration"  . DIABETES MELLITUS, TYPE II 09/06/2007  . DEPRESSIVE DISORDER 01/25/2008    "@ times; never treated for it"  . Melanoma of nose ([place])   . Compression fracture of lumbar vertebra ([facility])     hx  . Compression fracture of thoracic vertebra ([place]) 02/07/2014    "fell this am" (02/07/2014  . Arthritis     "hands" (03/15/2014)  .  Foley catheter in place on admission    Past Surgical History  Procedure Laterality Date  . Appendectomy  1943  . Abdominal hysterectomy  1964  . Cardiac electrophysiology mapping and ablation  1999  . Cataract extraction w/ intraocular lens  implant, bilateral  2003-2005  . Carpal tunnel release Right 2004  . Mohs surgery  2011    "removed off her nose"  . Cardiac catheterization  10/2009  . Esophageal dilation  X 2   Medication reviewed. See MAR  Physical Exam: Filed Vitals:   07/31/15 1252  BP: 114/62  Pulse: 82  Temp: 98.4 F (36.9 C)  Resp: 16  SpO2: 95%    General- 81-year-old female, thin built, frail Head-  normocephalic, atraumatic Nose- normal nasal mucosa, no maxillary or frontal sinus tenderness, no nasal discharge Throat- dry mucus membrane Eyes- PERRLA, EOMI, no pallor, no icterus, no discharge, normal conjunctiva, normal sclera Neck- no cervical lymphadenopathy Cardiovascular- normal s1,s2, no murmurs, no leg edema Respiratory- bilateral clear to auscultation, no wheeze, no rhonchi, no crackles, no use of accessory muscles Abdomen- bowel sounds present, soft, non tender Musculoskeletal- involuntary jerking of her upper and lower extremity noted, not following commands to assess for strength in her extremities, on wheelchair, high fall risk Neurological- prominent left mouth droop noted which is new per family, not following commands Skin- warm and dry   Labs reviewed: Basic Metabolic Panel:  Recent Labs  07/07/15 0539 07/08/15 0244 07/09/15 0406 07/16/15  NA 139 138 140 146  K 4.2 4.2 4.1 3.3*  CL 105 105 108  --   CO2 25 25 24   --   GLUCOSE 73 223* 133*  --   BUN 22* 29* 25* 24*  CREATININE 1.24* 1.35* 1.21* 1.0  CALCIUM 9.3 9.0 9.0  --    Liver Function Tests:  Recent Labs  09/05/14 0406 03/06/15 1028 07/06/15 1220  AST 23 16 22   ALT 22 14 20   ALKPHOS 59 51 55  BILITOT 0.4 0.4 0.6  PROT 5.4* 5.9* 5.0*  ALBUMIN 3.2* 3.5 3.1*   No results for input(s): LIPASE, AMYLASE in the last 8760 hours. No results for input(s): AMMONIA in the last 8760 hours. CBC:  Recent Labs  09/05/14 0406  03/06/15 1028  07/06/15 1220  07/08/15 0244 07/09/15 0406 07/10/15 0524 07/16/15  WBC 4.9  < > 4.4  < > 4.8  < > 5.7 5.7 5.7 5.3  NEUTROABS 3.5  --  3.3  --  3.7  --   --   --   --   --   HGB 12.5  < > 11.9*  < > 11.5*  < > 10.7* 10.7* 10.6* 10.9*  HCT 38.4  < > 36.1  < > 35.6*  < > 34.3* 34.6* 34.3* 34*  MCV 89.1  < > 82.3  < > 87.5  < > 89.3 89.4 88.9  --   PLT 172  < > 181.0  < > PLATELETS APPEAR ADEQUATE  < > 163 159 168 207  < > = values in this interval not  displayed.  07/23/15 na 153, k 3.5, bun 25, cr 1.09, cl 114, co2 29.2, gfr 49.89   Radiological Exams: Dg Chest 2 View  07/06/2015  CLINICAL DATA:  Altered mental status EXAM: CHEST  2 VIEW COMPARISON:  September 04, 2014 FINDINGS: There is patchy airspace consolidation in the left base with minimal left effusion. Lungs elsewhere clear. Heart size and pulmonary vascularity are normal. No adenopathy. There is  atherosclerotic change in aorta. There is anterior wedging of several thoracic vertebral bodies. IMPRESSION: Left base airspace consolidation. Followup PA and lateral chest radiographs recommended in 3-4 weeks following trial of antibiotic therapy to ensure resolution and exclude underlying malignancy. Electronically Signed   By: Lowella Grip III M.D.   On: 07/06/2015 12:57   Ct Head Wo Contrast  07/06/2015  CLINICAL DATA:  Increased fatigue, lethargy and confusion EXAM: CT HEAD WITHOUT CONTRAST TECHNIQUE: Contiguous axial images were obtained from the base of the skull through the vertex without contrast. COMPARISON:  08/27/2014 . FINDINGS: stable diffuse brain atrophy pattern and chronic periventricular white matter microvascular ischemic change. No acute intracranial hemorrhage, mass lesion, definite infarction, midline shift, herniation, hydrocephalus, or extra-axial fluid collection. No focal mass effect or edema. Mild cerebellar atrophy as well. Orbits are symmetric. Sinuses and mastoids remain clear. Skull appears intact IMPRESSION: Stable atrophy pattern and minor periventricular white matter microvascular ischemic change. No interval change or acute process by noncontrast CT. Electronically Signed   By: Jerilynn Mages.  Shick M.D.   On: 07/06/2015 12:56   Dg Abd Portable 2v  07/06/2015  CLINICAL DATA:  Right lower quadrant abdominal pain and vomiting today EXAM: PORTABLE ABDOMEN - 2 VIEW COMPARISON:  09/04/2014 CT abdomen/pelvis FINDINGS: No disproportionately dilated small bowel loops or fluid levels  to suggest small bowel obstruction. Mild diffuse gaseous distention of the stool filled colon and rectum. No evidence of pneumatosis or pneumoperitoneum. Mild patchy opacity at the left lung base. Marked degenerative changes throughout the visualized thoracolumbar spine and atherosclerotic calcifications throughout the abdomen and pelvis. IMPRESSION: Nonobstructive bowel gas pattern.  No free air. Patchy left lung base opacity, correlate with chest radiograph. Electronically Signed   By: Ilona Sorrel M.D.   On: 07/06/2015 20:44     Assessment/Plan  Altered mental state New compared to my last visit with her. Not following commands today. Confused.With new left sided droop of her mouth and involuntary jerking that she had for a minute or so per family, concern for TIA vs seizure. She had hypernatremia the last time I saw her. If this has worsened it could increase risk of seizure as well. Will send patient to ED for further evaluation on intracranial abnormality vs metabolic encephalopathy. EMS called and patient to be transferred to the ED  Jerking movement Lasting for a minute or more per family. Possible seizure disorder. None witnessed at present. Sending to ED for further evaluation with prolactin test and possible EEG  Mouth droop New with facial weakness noted. Will need to rule out TIA vs stroke, send patient to ED for further evaluation   Family/ staff Communication: reviewed care plan with patient's daughter and nursing supervisor    Blanchie Serve, MD  [place] [phone] (Monday-Friday 8 am - 5 pm) [phone] (afterhours)

## 2015-07-31 NOTE — Consult Note (Signed)
NEURO HOSPITALIST CONSULT NOTE   Referring physician: Dr Posey Pronto Reason for Consult: altered mental status  HPI:                                                                                                                                          Lindsay Sheppard is an 81 y.o. 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 The patient is a resident at [facility] skilled nursing facility. Patient was recently hospitalized for acute encephalopathy which was thought to be secondary to pneumonia and UTI and was discharged on Augmentin to a skilled nursing facility. She has a chronic Foley catheter.She was switched to ceftriaxone at the facility. In the facility the patient was found to be having insomnia and was started on Remeron. She was also started on fluconazole due to fungal infection for 3 days. She was found to be hypernatremic with a sodium of 150 which improved with treatment. Patient was progressing well until yesterday when she started having complaints of confusion she was also having hallucination and was trying to pick things from the air. Her daughter is at the bedside and said that Lindsay Sheppard was functioning reasonably well since discharge from rehab, but this morning she became poorly responsive, no able to interact with family or the staff at the facility where she resides, and hasn't been able to ambulate with support as she usually does. At her baseline when she is stable she is able to feed herself, take her medications herself, assist in clothing as well as bathing and is generally awake throughout the day. She said that she was noted to have couple of episodes of upper body stiffening and  twitching. No recent fever, chills, or falls. Presently, she open her eyes to verbal commands and follows simple steps commands. CT/MRI/MRA brain were personally reviewed and showed no acute abnormality. Serologies: normal ammonia, UA cloudy with large amount of leukocytes, many bacteria , but negative nitrites. Cr 1.35. WBC 10.6  Past Medical History  Diagnosis Date  . POSTHERPETIC NEURALGIA 11/02/2009  . B12 DEFICIENCY 05/23/2007  . HYPERLIPIDEMIA 05/23/2007  . HYPERKALEMIA 05/30/2010  . ANEMIA-IRON DEFICIENCY 01/26/2007  . BLEPHARITIS, BILATERAL 02/23/2008  . Other specified forms of hearing loss 05/30/2010  . HYPERTENSION 01/26/2007  . Atrial fibrillation ([place]) 05/23/2007  . DIASTOLIC HEART FAILURE, CHRONIC 12/14/2009  . POSTURAL HYPOTENSION 02/07/2008  . ALLERGIC RHINITIS 11/02/2009  . GERD 01/26/2007  . CONSTIPATION 01/25/2008  . SKIN LESION 05/30/2010  . BACK PAIN 08/02/2007  . OSTEOPOROSIS 05/23/2007  . SYNCOPE 02/07/2008  .  FATIGUE 09/06/2007  . Dysuria 09/05/2008  . Abdominal pain, epigastric 09/06/2007  . EPIGASTRIC TENDERNESS 10/12/2007  . PULMONARY EMBOLISM, HX OF 05/23/2007  . SHINGLES, HX OF 05/23/2007  . 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 Left foot pain   . Diabetic neuropathy ([place])   . RENAL INSUFFICIENCY 07/02/2009  . RENAL CYST, LEFT 09/06/2007  . Kidney stones     "never had OR"  . Family history of anesthesia complication     "daughter has PONV; another daughter a little anesthesia lasts way too long"  . CHF (congestive heart failure) ([place]) 2011  . Myocardial infarction [facility]) 2011    "mild"  . DVT (deep venous thrombosis) ([place]) ~ 1964    "think it was in the left"  . Aspiration pneumonia ([place]) ~ 2006    "due to aspiration"  . DIABETES MELLITUS, TYPE II 09/06/2007  . DEPRESSIVE DISORDER 01/25/2008    "@ times; never treated for it"  . Melanoma of nose ([place])   . Compression fracture of lumbar vertebra ([facility])     hx  . Compression fracture of thoracic vertebra ([place]) 02/07/2014     "fell this am" (02/07/2014  . Arthritis     "hands" (03/15/2014)  . Foley catheter in place on admission     Past Surgical History  Procedure Laterality Date  . Appendectomy  1943  . Abdominal hysterectomy  1964  . Cardiac electrophysiology mapping and ablation  1999  . Cataract extraction w/ intraocular lens  implant, bilateral  2003-2005  . Carpal tunnel release Right 2004  . Mohs surgery  2011    "removed off her nose"  . Cardiac catheterization  10/2009  . Esophageal dilation  X 2    Family History  Problem Relation Age of Onset  . Breast cancer Mother   . Colon cancer Father   . Stroke Father     died with stroke postop  . Diabetes Sister     2 sisters  . Hypertension Other     Family History: no MS, epilepsy, or brain tumor   Social History:  reports that she has never smoked. She has never used smokeless tobacco. She reports that she does not drink alcohol or use illicit drugs.  Allergies  Allergen Reactions  . Amiodarone Other (See Comments)    REACTION: f? fluid retention  . Deltasone [Prednisone] Other (See Comments)    unknown  . Diltiazem Hcl Other (See Comments)    unknown  . Levofloxacin Nausea And Vomiting    REACTION: nausea  . Pregabalin Other (See Comments)    REACTION: hallucinations  . Spironolactone Other (See Comments)    REACTION: elevated K at lowest dose  . Tequin [Gatifloxacin] Other (See Comments)    Hypoglycemia  . Diazepam Anxiety and Other (See Comments)    REACTION: agitation    MEDICATIONS:  I have reviewed the patient's current medications.   ROS: unable to obtain due to mental status                                                                                                                                      History obtained from daughter and chart review   Physical exam:  Constitutional: elderly female  female in no apparent distress. Blood pressure 161/82, pulse 109, temperature 99.5 F (37.5 C), temperature source Rectal, resp. rate 22, height $RemoveBe'5\' 5"'OaUvFonqA$  (1.651 m), weight 63.504 kg (140 lb), SpO2 96 %. Eyes: no jaundice or exophthalmos.  Head: normocephalic. Neck: supple, no bruits, no JVD. Cardiac: no murmurs. Lungs: clear. Abdomen: soft, no tender, no mass. Extremities: no edema, clubbing, or cyanosis.  Skin: no rash  Neurologic Examination:                                                                                                      General: NAD Mental Status: Open eyes to verbal commands. Speech fluent without evidence of aphasia.  Able to follow simple step commands without difficulty. Cranial Nerves: II: Discs flat bilaterally; Visual fields grossly normal, pupils equal, round, reactive to light III,IV, VI: ptosis not present, extra-ocular motions intact bilaterally V,VII: smile symmetric, facial light touch sensation normal bilaterally VIII: hearing normal bilaterally IX,X: uvula rises symmetrically XI: bilateral shoulder shrug XII: midline tongue extension without atrophy or fasciculations Motor: Moves all limbs spontaneously and symmetrically Tone and bulk:normal tone throughout; no atrophy noted Sensory:  Reacts to noxious stimuli Deep Tendon Reflexes:  1 all over Plantars: Right: downgoing   Left: downgoing Cerebellar: Unable to test due to mental status Gait:  Unable to test due to mental status No meningeal signs    Lab Results  Component Value Date/Time   CHOL 115 03/06/2015 10:28 AM    Results for orders placed or performed during the hospital encounter of 07/31/15 (from the past 48 hour(s))  Comprehensive metabolic panel     Status: Abnormal   Collection Time: 07/31/15 12:48 PM  Result Value Ref Range   Sodium 136 135 - 145 mmol/L   Potassium 4.4 3.5 - 5.1 mmol/L   Chloride 103 101 - 111 mmol/L   CO2 25 22 - 32 mmol/L   Glucose, Bld 307 (H) 65 -  99 mg/dL   BUN 27 (H) 6 - 20 mg/dL   Creatinine, Ser 1.35 (H) 0.44 - 1.00 mg/dL   Calcium 9.6 8.9 - 10.3 mg/dL  Total Protein 5.9 (L) 6.5 - 8.1 g/dL   Albumin 3.3 (L) 3.5 - 5.0 g/dL   AST 22 15 - 41 U/L   ALT 29 14 - 54 U/L   Alkaline Phosphatase 74 38 - 126 U/L   Total Bilirubin 0.7 0.3 - 1.2 mg/dL   GFR calc non Af Amer 33 (L) >60 mL/min   GFR calc Af Amer 38 (L) >60 mL/min    Comment: (NOTE) The eGFR has been calculated using the CKD EPI equation. This calculation has not been validated in all clinical situations. eGFR's persistently <60 mL/min signify possible Chronic Kidney Disease.    Anion gap 8 5 - 15  CBC with Differential/Platelet     Status: Abnormal   Collection Time: 07/31/15 12:48 PM  Result Value Ref Range   WBC 10.6 (H) 4.0 - 10.5 K/uL   RBC 4.03 3.87 - 5.11 MIL/uL   Hemoglobin 11.1 (L) 12.0 - 15.0 g/dL   HCT 35.6 (L) 36.0 - 46.0 %   MCV 88.3 78.0 - 100.0 fL   MCH 27.5 26.0 - 34.0 pg   MCHC 31.2 30.0 - 36.0 g/dL   RDW 16.0 (H) 11.5 - 15.5 %   Platelets 169 150 - 400 K/uL   Neutrophils Relative % 83 %   Neutro Abs 8.9 (H) 1.7 - 7.7 K/uL   Lymphocytes Relative 6 %   Lymphs Abs 0.6 (L) 0.7 - 4.0 K/uL   Monocytes Relative 10 %   Monocytes Absolute 1.0 0.1 - 1.0 K/uL   Eosinophils Relative 1 %   Eosinophils Absolute 0.1 0.0 - 0.7 K/uL   Basophils Relative 0 %   Basophils Absolute 0.0 0.0 - 0.1 K/uL  Urinalysis, Routine w reflex microscopic (not at [facility])     Status: Abnormal   Collection Time: 07/31/15  3:17 PM  Result Value Ref Range   Color, Urine AMBER (A) YELLOW    Comment: BIOCHEMICALS MAY BE AFFECTED BY COLOR   APPearance CLOUDY (A) CLEAR   Specific Gravity, Urine 1.025 1.005 - 1.030   pH 5.5 5.0 - 8.0   Glucose, UA 500 (A) NEGATIVE mg/dL   Hgb urine dipstick MODERATE (A) NEGATIVE   Bilirubin Urine SMALL (A) NEGATIVE   Ketones, ur NEGATIVE NEGATIVE mg/dL   Protein, ur 100 (A) NEGATIVE mg/dL   Nitrite NEGATIVE NEGATIVE   Leukocytes, UA LARGE (A)  NEGATIVE  Urine microscopic-add on     Status: Abnormal   Collection Time: 07/31/15  3:17 PM  Result Value Ref Range   Squamous Epithelial / LPF 0-5 (A) NONE SEEN   WBC, UA TOO NUMEROUS TO COUNT 0 - 5 WBC/hpf   RBC / HPF 6-30 0 - 5 RBC/hpf   Bacteria, UA MANY (A) NONE SEEN   Urine-Other YEAST PRESENT   Protime-INR     Status: Abnormal   Collection Time: 07/31/15  3:36 PM  Result Value Ref Range   Prothrombin Time 20.8 (H) 11.6 - 15.2 seconds   INR 1.80 (H) 0.00 - 1.49  APTT     Status: Abnormal   Collection Time: 07/31/15  3:36 PM  Result Value Ref Range   aPTT 48 (H) 24 - 37 seconds    Comment:        IF BASELINE aPTT IS ELEVATED, SUGGEST PATIENT RISK ASSESSMENT BE USED TO DETERMINE APPROPRIATE ANTICOAGULANT THERAPY.   Ammonia     Status: None   Collection Time: 07/31/15  3:37 PM  Result Value Ref Range   Ammonia 11  9 - 35 umol/L  I-Stat CG4 Lactic Acid, ED     Status: None   Collection Time: 07/31/15  3:57 PM  Result Value Ref Range   Lactic Acid, Venous 1.13 0.5 - 2.0 mmol/L  Procalcitonin     Status: None   Collection Time: 07/31/15  5:19 PM  Result Value Ref Range   Procalcitonin 0.36 ng/mL    Comment:        Interpretation: PCT (Procalcitonin) <= 0.5 ng/mL: Systemic infection (sepsis) is not likely. Local bacterial infection is possible. (NOTE)         ICU PCT Algorithm               Non ICU PCT Algorithm    ----------------------------     ------------------------------         PCT < 0.25 ng/mL                 PCT < 0.1 ng/mL     Stopping of antibiotics            Stopping of antibiotics       strongly encouraged.               strongly encouraged.    ----------------------------     ------------------------------       PCT level decrease by               PCT < 0.25 ng/mL       >= 80% from peak PCT       OR PCT 0.25 - 0.5 ng/mL          Stopping of antibiotics                                             encouraged.     Stopping of antibiotics            encouraged.    ----------------------------     ------------------------------       PCT level decrease by              PCT >= 0.25 ng/mL       < 80% from peak PCT        AND PCT >= 0.5 ng/mL            Continuin g antibiotics                                              encouraged.       Continuing antibiotics            encouraged.    ----------------------------     ------------------------------     PCT level increase compared          PCT > 0.5 ng/mL         with peak PCT AND          PCT >= 0.5 ng/mL             Escalation of antibiotics                                          strongly encouraged.  Escalation of antibiotics        strongly encouraged.   Protime-INR     Status: Abnormal   Collection Time: 07/31/15  5:19 PM  Result Value Ref Range   Prothrombin Time 22.0 (H) 11.6 - 15.2 seconds   INR 1.94 (H) 0.00 - 1.49  APTT     Status: Abnormal   Collection Time: 07/31/15  5:19 PM  Result Value Ref Range   aPTT 46 (H) 24 - 37 seconds    Comment:        IF BASELINE aPTT IS ELEVATED, SUGGEST PATIENT RISK ASSESSMENT BE USED TO DETERMINE APPROPRIATE ANTICOAGULANT THERAPY.     Ct Head Wo Contrast  07/31/2015  CLINICAL DATA:  Altered mental status; lethargic EXAM: CT HEAD WITHOUT CONTRAST TECHNIQUE: Contiguous axial images were obtained from the base of the skull through the vertex without intravenous contrast. COMPARISON:  July 06, 2015 FINDINGS: Mild diffuse atrophy is stable. There is no intracranial mass, hemorrhage, extra-axial fluid collection, or midline shift. There is rather minimal small vessel disease in the centra semiovale bilaterally. Elsewhere gray-white compartments appear normal. No acute infarct is evident. Bony calvarium appears intact. The mastoid air cells are clear. There are no intraorbital lesions appreciable. IMPRESSION: Mild atrophy with rather minimal periventricular small vessel disease, stable. No acute infarct evident. No hemorrhage or mass  effect. Electronically Signed   By: Lowella Grip III M.D.   On: 07/31/2015 14:04   Mr Brain Wo Contrast  07/31/2015  CLINICAL DATA:  81 year old female with worsening confusion since yesterday. Acute encephalopathy. Myoclonic jerking. Initial encounter. EXAM: MRI HEAD WITHOUT CONTRAST MRA HEAD WITHOUT CONTRAST TECHNIQUE: Multiplanar, multiecho pulse sequences of the brain and surrounding structures were obtained without intravenous contrast. Angiographic images of the head were obtained using MRA technique without contrast. COMPARISON:  Head CT without contrast 1354 hours today and earlier. Brain MRI 03/21/2014, and 11/15/2004. FINDINGS: MRI HEAD FINDINGS Cerebral volume is stable. Major intracranial vascular flow voids are stable, with chronic absence of the distal left vertebral artery flow void (see also 11/15/2004 series 3, image 4). No restricted diffusion to suggest acute infarction. No midline shift, mass effect, evidence of mass lesion, ventriculomegaly, extra-axial collection or acute intracranial hemorrhage. Cervicomedullary junction and pituitary are within normal limits. [place] Cables and white matter signal throughout the brain appears stable and normal for age. Axial T1 weighted images are severely degraded by motion despite repeated attempts. Trace mastoid fluid is unchanged. Grossly negative visualized internal auditory structures. Trace paranasal sinus mucosal thickening is stable. Orbit and scalp soft tissues are stable. Negative visualized cervical spine. Normal bone marrow signal. MRA HEAD FINDINGS Loss of flow signal in the distal left vertebral artery. Antegrade flow in the distal right vertebral artery. Right PICA appears patent. Patent basilar artery with up to mild stenosis distally. Bilateral AICA and SCA origins are patent. Fetal type bilateral PCA origins. Bilateral PCA branches are within normal limits for age. Antegrade flow in both ICA siphons. Mild to moderate siphon dolichoectasia  with no siphon stenosis. Ophthalmic and posterior communicating artery origins appear normal. Patent carotid termini. Normal MCA and ACA origins. Anterior communicating artery and visualized bilateral ACA branches are within normal limits. The left MCA bifurcation is a ectatic without discrete saccular aneurysm. Otherwise the visualized bilateral MCA branches are within normal limits. IMPRESSION: 1. Intermittently motion degraded despite repeated imaging attempts. 2.  No acute infarct or acute intracranial abnormality identified. 3. Chronic distal left vertebral artery occlusion. Otherwise negative for age intracranial MRA.  Electronically Signed   By: Odessa Fleming M.D.   On: 07/31/2015 19:24   Dg Chest Port 1 View  07/31/2015  CLINICAL DATA:  81 year old presenting with acute mental status changes and lethargy. EXAM: PORTABLE CHEST 1 VIEW COMPARISON:  07/06/2015 and earlier. FINDINGS: Airspace consolidation in the left lower lobe with silhouetting the left hemidiaphragm, similar in appearance to the most recent examination 3+ weeks ago. No new pulmonary parenchymal abnormalities elsewhere. Cardiac silhouette mildly to moderately enlarged, unchanged. Mild pulmonary venous hypertension without overt edema. IMPRESSION: 1. Left lower lobe atelectasis and/or pneumonia, not significantly changed since 07/06/2015. 2. No new abnormalities. Electronically Signed   By: Hulan Saas M.D.   On: 07/31/2015 13:20   Mr Maxine Glenn Head/brain Wo Cm  07/31/2015  CLINICAL DATA:  81 year old female with worsening confusion since yesterday. Acute encephalopathy. Myoclonic jerking. Initial encounter. EXAM: MRI HEAD WITHOUT CONTRAST MRA HEAD WITHOUT CONTRAST TECHNIQUE: Multiplanar, multiecho pulse sequences of the brain and surrounding structures were obtained without intravenous contrast. Angiographic images of the head were obtained using MRA technique without contrast. COMPARISON:  Head CT without contrast 1354 hours today and earlier.  Brain MRI 03/21/2014, and 11/15/2004. FINDINGS: MRI HEAD FINDINGS Cerebral volume is stable. Major intracranial vascular flow voids are stable, with chronic absence of the distal left vertebral artery flow void (see also 11/15/2004 series 3, image 4). No restricted diffusion to suggest acute infarction. No midline shift, mass effect, evidence of mass lesion, ventriculomegaly, extra-axial collection or acute intracranial hemorrhage. Cervicomedullary junction and pituitary are within normal limits. Wallace Cullens and white matter signal throughout the brain appears stable and normal for age. Axial T1 weighted images are severely degraded by motion despite repeated attempts. Trace mastoid fluid is unchanged. Grossly negative visualized internal auditory structures. Trace paranasal sinus mucosal thickening is stable. Orbit and scalp soft tissues are stable. Negative visualized cervical spine. Normal bone marrow signal. MRA HEAD FINDINGS Loss of flow signal in the distal left vertebral artery. Antegrade flow in the distal right vertebral artery. Right PICA appears patent. Patent basilar artery with up to mild stenosis distally. Bilateral AICA and SCA origins are patent. Fetal type bilateral PCA origins. Bilateral PCA branches are within normal limits for age. Antegrade flow in both ICA siphons. Mild to moderate siphon dolichoectasia with no siphon stenosis. Ophthalmic and posterior communicating artery origins appear normal. Patent carotid termini. Normal MCA and ACA origins. Anterior communicating artery and visualized bilateral ACA branches are within normal limits. The left MCA bifurcation is a ectatic without discrete saccular aneurysm. Otherwise the visualized bilateral MCA branches are within normal limits. IMPRESSION: 1. Intermittently motion degraded despite repeated imaging attempts. 2.  No acute infarct or acute intracranial abnormality identified. 3. Chronic distal left vertebral artery occlusion. Otherwise negative for  age intracranial MRA. Electronically Signed   By: Odessa Fleming M.D.   On: 07/31/2015 19:24   Assessment/Plan: 81 y/o with multiple medical problems, recently discharged from the hospital due to acute toxic metabolic encephalopathy, brought back due to worsening confusion, inability to function, and reported episodes of paroxysmal hyperkinetic movements as described above. Non focal neuro-exam, neuro-imaging without acute abnormality. Suspect underlying cognitive disorder making patient susceptible to superimposed encephalopathy that is this case could still be toxic-metabolic. She was recently started on mirtazapine which could also be a contributing factor, hence will suggest holding off. Partial complex status unlikely but agree with EEG. Will continue to follow.  Wyatt Portela, MD 07/31/2015, 7:42 PM  [facility]

## 2015-07-31 NOTE — ED Notes (Signed)
Inpatient RN unable to take report at this time.  Will call back. 

## 2015-07-31 NOTE — ED Notes (Signed)
Pt brought in EMS from [facility] for AMS/lethargy.  Pt daughter reports she last saw pt last night around dinner time and when she visited her this morning she noticed she had "thick speech" and was more lethargic than normal.  Per daughter, pt is usually able to carry on conversation and is oriented.  Pt has hx of frequent UTI's and has an indwelling FC upon arrival.

## 2015-07-31 NOTE — Progress Notes (Deleted)
Pass word = [place]

## 2015-07-31 NOTE — Progress Notes (Signed)
ANTIBIOTIC and ANTICOAGULATION CONSULT NOTE - INITIAL  Pharmacy Consult for Zosyn and Warfarin Indication: rule out sepsis and Afib  Allergies  Allergen Reactions  . Amiodarone Other (See Comments)    REACTION: f? fluid retention  . Deltasone [Prednisone] Other (See Comments)    unknown  . Diltiazem Hcl Other (See Comments)    unknown  . Levofloxacin Nausea And Vomiting    REACTION: nausea  . Pregabalin Other (See Comments)    REACTION: hallucinations  . Spironolactone Other (See Comments)    REACTION: elevated K at lowest dose  . Tequin [Gatifloxacin] Other (See Comments)    Hypoglycemia  . Diazepam Anxiety and Other (See Comments)    REACTION: agitation    Patient Measurements: Height: 5\' 5"  (165.1 cm) Weight: 140 lb (63.504 kg) IBW/kg (Calculated) : 57 Adjusted Body Weight:   Vital Signs: Temp: 99.5 F (37.5 C) (11/29 1600) Temp Source: Rectal (11/29 1600) BP: 172/94 mmHg (11/29 1430) Pulse Rate: 95 (11/29 1430) Intake/Output from previous day:   Intake/Output from this shift:    Labs:  Recent Labs  07/31/15 1248  WBC 10.6*  HGB 11.1*  PLT 169  CREATININE 1.35*   Estimated Creatinine Clearance: 23.9 mL/min (by C-G formula based on Cr of 1.35). No results for input(s): VANCOTROUGH, VANCOPEAK, VANCORANDOM, GENTTROUGH, GENTPEAK, GENTRANDOM, TOBRATROUGH, TOBRAPEAK, TOBRARND, AMIKACINPEAK, AMIKACINTROU, AMIKACIN in the last 72 hours.   Microbiology: Recent Results (from the past 720 hour(s))  Culture, Urine     Status: None   Collection Time: 07/06/15  1:00 PM  Result Value Ref Range Status   Specimen Description URINE, RANDOM  Final   Special Requests NONE  Final   Culture MULTIPLE SPECIES PRESENT, SUGGEST RECOLLECTION  Final   Report Status 07/08/2015 FINAL  Final  Blood culture (routine x 2)     Status: None   Collection Time: 07/06/15  2:00 PM  Result Value Ref Range Status   Specimen Description BLOOD LEFT ARM  Final   Special Requests BOTTLES  DRAWN AEROBIC AND ANAEROBIC 5CC  Final   Culture NO GROWTH 5 DAYS  Final   Report Status 07/11/2015 FINAL  Final  Blood culture (routine x 2)     Status: None   Collection Time: 07/06/15  2:05 PM  Result Value Ref Range Status   Specimen Description BLOOD RIGHT HAND  Final   Special Requests BOTTLES DRAWN AEROBIC AND ANAEROBIC 5CC  Final   Culture NO GROWTH 5 DAYS  Final   Report Status 07/11/2015 FINAL  Final    Medical History: Past Medical History  Diagnosis Date  . POSTHERPETIC NEURALGIA 11/02/2009  . B12 DEFICIENCY 05/23/2007  . HYPERLIPIDEMIA 05/23/2007  . HYPERKALEMIA 05/30/2010  . ANEMIA-IRON DEFICIENCY 01/26/2007  . BLEPHARITIS, BILATERAL 02/23/2008  . Other specified forms of hearing loss 05/30/2010  . HYPERTENSION 01/26/2007  . Atrial fibrillation ([place]) 05/23/2007  . DIASTOLIC HEART FAILURE, CHRONIC 12/14/2009  . POSTURAL HYPOTENSION 02/07/2008  . ALLERGIC RHINITIS 11/02/2009  . GERD 01/26/2007  . CONSTIPATION 01/25/2008  . SKIN LESION 05/30/2010  . BACK PAIN 08/02/2007  . OSTEOPOROSIS 05/23/2007  . SYNCOPE 02/07/2008  . FATIGUE 09/06/2007  . Dysuria 09/05/2008  . Abdominal pain, epigastric 09/06/2007  . EPIGASTRIC TENDERNESS 10/12/2007  . PULMONARY EMBOLISM, HX OF 05/23/2007  . SHINGLES, HX OF 05/23/2007  . 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 Left foot pain   . Diabetic neuropathy ([place])   . RENAL INSUFFICIENCY 07/02/2009  . RENAL CYST, LEFT 09/06/2007  . Kidney stones     "  never had OR"  . Family history of anesthesia complication     "daughter has PONV; another daughter a little anesthesia lasts way too long"  . CHF (congestive heart failure) ([place]) 2011  . Myocardial infarction [facility]) 2011    "mild"  . DVT (deep venous thrombosis) ([place]) ~ 1964    "think it was in the left"  . Aspiration pneumonia ([place]) ~ 2006    "due to aspiration"  . DIABETES MELLITUS, TYPE II 09/06/2007  . DEPRESSIVE DISORDER 01/25/2008    "@ times; never treated for it"  . Melanoma of nose ([place])   . Compression  fracture of lumbar vertebra ([facility])     hx  . Compression fracture of thoracic vertebra ([place]) 02/07/2014    "fell this am" (02/07/2014  . Arthritis     "hands" (03/15/2014)  . Foley catheter in place on admission     Medications:   (Not in a hospital admission) Scheduled:  .  stroke: mapping our early stages of recovery book   Does not apply Once  . aspirin  300 mg Rectal Daily   Or  . aspirin  325 mg Oral Daily  . [START ON 08/01/2015] benztropine  0.5 mg Oral Daily  . cloNIDine  0.3 mg Transdermal Weekly  . heparin  5,000 Units Subcutaneous 3 times per day  . insulin aspart  0-15 Units Subcutaneous Q6H  . [START ON 08/01/2015] labetalol  400 mg Oral BID  . [START ON 08/01/2015] pantoprazole  40 mg Oral Daily  . [START ON 08/01/2015] polyethylene glycol  17 g Oral BID  . [START ON 08/01/2015] pravastatin  20 mg Oral q1800   Infusions:  . piperacillin-tazobactam     Assessment: 81yo female with history of Afib on warfarin presents with AMS. Pharmacy is consulted to dose zosyn for suspected sepsis and warfarin for Afib. Tmax 99.5, WBC 10.6, sCr 1.35, LA 1.13, INR 1.8, Hgb 11.1, Plt 169.  PTA Warfarin Dose:  2mg /day with last dose 11/28  Goal of Therapy:  Eradication of infection  Plan:  Zosyn 3.375g IV q8h Follow up culture results, renal function and clinical course Warfarin 3mg  tonight x1 Daily INR/CBC  Andrey Cota. Diona Foley, PharmD, [place] Clinical Pharmacist Pager [phone] 07/31/2015,5:05 PM

## 2015-08-01 ENCOUNTER — Other Ambulatory Visit: Payer: Self-pay | Admitting: Licensed Clinical Social Worker

## 2015-08-01 ENCOUNTER — Inpatient Hospital Stay (HOSPITAL_COMMUNITY): Payer: Medicare Other

## 2015-08-01 LAB — GLUCOSE, CAPILLARY
GLUCOSE-CAPILLARY: 251 mg/dL — AB (ref 65–99)
GLUCOSE-CAPILLARY: 64 mg/dL — AB (ref 65–99)
GLUCOSE-CAPILLARY: 72 mg/dL (ref 65–99)
GLUCOSE-CAPILLARY: 80 mg/dL (ref 65–99)
GLUCOSE-CAPILLARY: 81 mg/dL (ref 65–99)
Glucose-Capillary: 82 mg/dL (ref 65–99)

## 2015-08-01 LAB — LIPID PANEL
Cholesterol: 115 mg/dL (ref 0–200)
HDL: 40 mg/dL — ABNORMAL LOW (ref >40)
LDL CALC: 58 mg/dL (ref 0–99)
Total CHOL/HDL Ratio: 2.9 RATIO
Triglycerides: 86 mg/dL (ref <150)
VLDL: 17 mg/dL (ref 0–40)

## 2015-08-01 LAB — BASIC METABOLIC PANEL
Anion gap: 7 (ref 5–15)
BUN: 22 mg/dL — AB (ref 6–20)
CHLORIDE: 105 mmol/L (ref 101–111)
CO2: 27 mmol/L (ref 22–32)
CREATININE: 1.2 mg/dL — AB (ref 0.44–1.00)
Calcium: 9.4 mg/dL (ref 8.9–10.3)
GFR calc Af Amer: 44 mL/min — ABNORMAL LOW (ref >60)
GFR calc non Af Amer: 38 mL/min — ABNORMAL LOW (ref >60)
GLUCOSE: 83 mg/dL (ref 65–99)
POTASSIUM: 3.9 mmol/L (ref 3.5–5.1)
SODIUM: 139 mmol/L (ref 135–145)

## 2015-08-01 LAB — CBC
HCT: 35.8 % — ABNORMAL LOW (ref 36.0–46.0)
HEMOGLOBIN: 11.5 g/dL — AB (ref 12.0–15.0)
MCH: 27.9 pg (ref 26.0–34.0)
MCHC: 32.1 g/dL (ref 30.0–36.0)
MCV: 86.9 fL (ref 78.0–100.0)
PLATELETS: 200 10*3/uL (ref 150–400)
RBC: 4.12 MIL/uL (ref 3.87–5.11)
RDW: 16 % — ABNORMAL HIGH (ref 11.5–15.5)
WBC: 14.3 10*3/uL — ABNORMAL HIGH (ref 4.0–10.5)

## 2015-08-01 LAB — PROTIME-INR
INR: 2.12 — ABNORMAL HIGH (ref 0.00–1.49)
PROTHROMBIN TIME: 23.6 s — AB (ref 11.6–15.2)

## 2015-08-01 MED ORDER — DEXTROSE 50 % IV SOLN
INTRAVENOUS | Status: AC
  Administered 2015-08-01: 50 mL
  Filled 2015-08-01: qty 50

## 2015-08-01 NOTE — Procedures (Signed)
ELECTROENCEPHALOGRAM REPORT   Patient: Lindsay Sheppard      Room #: 3S-03 Age: 81 y.o.        Sex: female Referring Physician: Dr Allyson Sabal [facility] Report Date:  08/01/2015        Interpreting Physician: Hulen Luster  History: Lindsay Sheppard is an 81 y.o. female with altered mental status  Medications:  Scheduled: .  stroke: mapping our early stages of recovery book   Does not apply Once  . benztropine  0.5 mg Oral Daily  . cloNIDine  0.3 mg Transdermal Weekly  . heparin  5,000 Units Subcutaneous 3 times per day  . insulin aspart  0-15 Units Subcutaneous Q6H  . labetalol  400 mg Oral BID  . pantoprazole  40 mg Oral Daily  . piperacillin-tazobactam (ZOSYN)  IV  3.375 g Intravenous Q8H  . polyethylene glycol  17 g Oral BID  . pravastatin  20 mg Oral q1800  . warfarin  3 mg Oral ONCE-1800  . Warfarin - Pharmacist Dosing Inpatient   Does not apply q1800    Conditions of Recording:  This is a 16 channel EEG carried out with the patient in the drowsy state.  Description:  The waking background activity consists of a low voltage, symmetrical, fairly well organized, mix of theta and occasional delta activity. No posterior dominant alpha rhythm is noted. No focal slowing or epileptiform activity is noted.   Hyperventilation was not performed. Intermittent photic stimulation was not performed.   IMPRESSION: This is an abnormal EEG secondary to general background slowing.  This finding may be seen with a diffuse disturbance that is etiologically nonspecific, but may include a metabolic encephalopathy, among other possibilities.  No epileptiform activity was noted.      Jim Like, DO [facility] [phone]  If 7pm- 7am, please page neurology on call as listed in [place]. 08/01/2015, 11:36 AM

## 2015-08-01 NOTE — Progress Notes (Signed)
[facility] PROGRESS NOTE  ALECHIA COUSINEAU [id number] DOB: December 03, 1920 DOA: 07/31/2015 PCP: [name], MD  Length of stay: 1   Assessment/Plan: Principal Problem:   Sepsis secondary to UTI [facility]) Active Problems:   B12 deficiency   Hyperlipidemia   Hearing loss   Essential hypertension   Atrial fibrillation ([place])   DIASTOLIC HEART FAILURE, CHRONIC   Protein-calorie malnutrition, severe ([place])   Dementia   Diabetes mellitus type 2, controlled ([place])   Acute encephalopathy   Myoclonic jerking   HPI 81 y.o. 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   Patient was recently hospitalized for acute encephalopathy which was thought to be secondary to pneumonia and UTI and was discharged on Augmentin to a skilled nursing facility. She has a chronic Foley catheter.She was switched to ceftriaxone at the facility. In the facility the patient was found to be having insomnia and was started on Remeron. She was also started on fluconazole due to fungal infection for 3 days. She was found to be hypernatremic with a sodium of 150 which improved with treatment. Patient was progressing well until yesterday when she started having complaints of confusion she was also having hallucination and was trying to pick things from the air. Her daughter is at the bedside and said that Mrs. Lesso was functioning reasonably well since discharge from rehab, but this morning she became poorly responsive, no able to interact with family or the staff at the facility where she resides, and hasn't been able to ambulate with support as she usually does. At her baseline when she is stable she is able to feed herself, take her medications herself,  assist in clothing as well as bathing and is generally awake throughout the day. She said that she was noted to have couple of episodes of upper body stiffening and twitching. Presently, she open her eyes to verbal commands and follows simple steps commands. CT/MRI/MRA brain were personally reviewed and showed no acute abnormality. Serologies: normal ammonia, UA cloudy with large amount of leukocytes, many bacteria , but negative nitrites. Cr 1.35. WBC 10.6      Assessment and plan  1. Sepsis secondary to UTI , possible pyelonephritis SIRS on admission, urine too numerous to count WBC in the urine suggesting possible UTI and presenting with acute encephalopathy.   on room air,cont  Step down as still tachy Cont  IV Zosyn. Cont IV fluids. Follow the cultures  2. Atrial fibrillation, RVR,. In the 110's -120's . On coumadin , start heparin gtt when INR < 2.0 until can start coumadin Add metoprolol  Iv , on labetalol at home  , speech therapy recommends patient to be nothing by mouth    3. Chronic diastolic heart failure. Essential hypertension. Next and blood pressure elevated. We will continue clonidine patch. Hold lasix   4. Acute encephalopathy. EEG secondary to general background slowing. This finding may be seen with a diffuse disturbance that is etiologically nonspecific, but may include a metabolic encephalopathy, among other possibilities. No epileptiform activity was noted.  CT scan of the head is negative. Currently showing a non-focal exam other than postural tremor with superimposed asterixis. As noted prior MRI shows no acute stroke   pro calcitonin level 0.36  5. Chronic  Foley catheter. Will need to replace in setting of UTI   6. Diabetes mellitus type 2.  holding home medications and placing the patient on sliding scale insulin.  7. Myoclonic jerking. Neurologic currently unclear. Probably medication side effect versus intracranial pathology. Neck supple  versus infection. Restart cogentin when able to take PO   Nutrition: Nothing by mouth DVT Prophylaxis: subcutaneous Heparin     DVT prophylaxsis heaprin ,coumadin  Code Status:      Code Status Orders        Start     Ordered   07/31/15 1657  Do not attempt resuscitation (DNR)   Continuous    Question Answer Comment  In the event of cardiac or respiratory ARREST Do not call a "code blue"   In the event of cardiac or respiratory ARREST Do not perform Intubation, CPR, defibrillation or ACLS   In the event of cardiac or respiratory ARREST Use medication by any route, position, wound care, and other measures to relive pain and suffering. May use oxygen, suction and manual treatment of airway obstruction as needed for comfort.      07/31/15 1700    Advance Directive Documentation        Most Recent Value   Type of Advance Directive  Healthcare Power of [place], Living will [Bonnie B Baity daughter,Sharon B May Daughter]   Pre-existing out of facility DNR order (yellow form or pink MOST form)     "MOST" Form in Place?       Family Communication: family updated about patient's clinical progress Disposition Plan:  As above       Consultants:  Neurology  Procedures:  EEG    Antibiotics: Anti-infectives    Start     Dose/Rate Route Frequency Ordered Stop   07/31/15 2200  piperacillin-tazobactam (ZOSYN) IVPB 3.375 g     3.375 g 12.5 mL/hr over 240 Minutes Intravenous Every 8 hours 07/31/15 2048     07/31/15 1715  piperacillin-tazobactam (ZOSYN) IVPB 3.375 g  Status:  Discontinued     3.375 g 100 mL/hr over 30 Minutes Intravenous 3 times per day 07/31/15 1700 07/31/15 1714   07/31/15 1615  vancomycin (VANCOCIN) 1,250 mg in sodium chloride 0.9 % 250 mL IVPB  Status:  Discontinued     1,250 mg 166.7 mL/hr over 90 Minutes Intravenous  Once 07/31/15 1534 07/31/15 1700   07/31/15 1545  piperacillin-tazobactam (ZOSYN) IVPB 3.375 g     3.375 g 100 mL/hr over 30 Minutes  Intravenous  Once 07/31/15 1534 07/31/15 1646         HPI/Subjective: Patient somnolent but arousable, hypertensive overnight but blood pressure has improved today Patient's daughter is by the bedside  Objective: Filed Vitals:   08/01/15 0800 08/01/15 0900 08/01/15 1000 08/01/15 1100  BP: 175/75 150/86 145/62 132/77  Pulse: 127 101 93 90  Temp:      TempSrc:      Resp: 25 21 31 15   Height:      Weight:      SpO2: 97% 98% 98% 98%    Intake/Output Summary (Last 24 hours) at 08/01/15 1222 Last data filed at 08/01/15 0700  Gross per 24 hour  Intake    100 ml  Output    500 ml  Net   -400 ml    Exam:  General: Somnolent but arousable Lungs: Clear to auscultation bilaterally without wheezes or crackles Cardiovascular: Regular rate and rhythm without murmur gallop or rub normal S1 and S2 Abdomen: Nontender,  nondistended, soft, bowel sounds positive, no rebound, no ascites, no appreciable mass Extremities: No significant cyanosis, clubbing, or edema bilateral lower extremities     Data Review   Micro Results Recent Results (from the past 240 hour(s))  Urine culture     Status: None (Preliminary result)   Collection Time: 07/31/15  3:15 PM  Result Value Ref Range Status   Specimen Description URINE, RANDOM  Final   Special Requests NONE  Final   Culture >=100,000 COLONIES/mL GRAM NEGATIVE RODS  Final   Report Status PENDING  Incomplete  MRSA PCR Screening     Status: None   Collection Time: 07/31/15  8:52 PM  Result Value Ref Range Status   MRSA by PCR NEGATIVE NEGATIVE Final    Comment:        The GeneXpert MRSA Assay (FDA approved for NASAL specimens only), is one component of a comprehensive MRSA colonization surveillance program. It is not intended to diagnose MRSA infection nor to guide or monitor treatment for MRSA infections.     Radiology Reports Dg Chest 2 View  07/06/2015  CLINICAL DATA:  Altered mental status EXAM: CHEST  2 VIEW COMPARISON:   September 04, 2014 FINDINGS: There is patchy airspace consolidation in the left base with minimal left effusion. Lungs elsewhere clear. Heart size and pulmonary vascularity are normal. No adenopathy. There is atherosclerotic change in aorta. There is anterior wedging of several thoracic vertebral bodies. IMPRESSION: Left base airspace consolidation. Followup PA and lateral chest radiographs recommended in 3-4 weeks following trial of antibiotic therapy to ensure resolution and exclude underlying malignancy. Electronically Signed   By: Lowella Grip III M.D.   On: 07/06/2015 12:57   Ct Head Wo Contrast  07/31/2015  CLINICAL DATA:  Altered mental status; lethargic EXAM: CT HEAD WITHOUT CONTRAST TECHNIQUE: Contiguous axial images were obtained from the base of the skull through the vertex without intravenous contrast. COMPARISON:  July 06, 2015 FINDINGS: Mild diffuse atrophy is stable. There is no intracranial mass, hemorrhage, extra-axial fluid collection, or midline shift. There is rather minimal small vessel disease in the centra semiovale bilaterally. Elsewhere gray-white compartments appear normal. No acute infarct is evident. Bony calvarium appears intact. The mastoid air cells are clear. There are no intraorbital lesions appreciable. IMPRESSION: Mild atrophy with rather minimal periventricular small vessel disease, stable. No acute infarct evident. No hemorrhage or mass effect. Electronically Signed   By: Lowella Grip III M.D.   On: 07/31/2015 14:04   Ct Head Wo Contrast  07/06/2015  CLINICAL DATA:  Increased fatigue, lethargy and confusion EXAM: CT HEAD WITHOUT CONTRAST TECHNIQUE: Contiguous axial images were obtained from the base of the skull through the vertex without contrast. COMPARISON:  08/27/2014 . FINDINGS: stable diffuse brain atrophy pattern and chronic periventricular white matter microvascular ischemic change. No acute intracranial hemorrhage, mass lesion, definite infarction,  midline shift, herniation, hydrocephalus, or extra-axial fluid collection. No focal mass effect or edema. Mild cerebellar atrophy as well. Orbits are symmetric. Sinuses and mastoids remain clear. Skull appears intact IMPRESSION: Stable atrophy pattern and minor periventricular white matter microvascular ischemic change. No interval change or acute process by noncontrast CT. Electronically Signed   By: Jerilynn Mages.  Shick M.D.   On: 07/06/2015 12:56   Mr Brain Wo Contrast  07/31/2015  CLINICAL DATA:  81 year old female with worsening confusion since yesterday. Acute encephalopathy. Myoclonic jerking. Initial encounter. EXAM: MRI HEAD WITHOUT CONTRAST MRA HEAD WITHOUT CONTRAST TECHNIQUE: Multiplanar, multiecho pulse sequences of the brain and surrounding structures  were obtained without intravenous contrast. Angiographic images of the head were obtained using MRA technique without contrast. COMPARISON:  Head CT without contrast 1354 hours today and earlier. Brain MRI 03/21/2014, and 11/15/2004. FINDINGS: MRI HEAD FINDINGS Cerebral volume is stable. Major intracranial vascular flow voids are stable, with chronic absence of the distal left vertebral artery flow void (see also 11/15/2004 series 3, image 4). No restricted diffusion to suggest acute infarction. No midline shift, mass effect, evidence of mass lesion, ventriculomegaly, extra-axial collection or acute intracranial hemorrhage. Cervicomedullary junction and pituitary are within normal limits. [place] Cables and white matter signal throughout the brain appears stable and normal for age. Axial T1 weighted images are severely degraded by motion despite repeated attempts. Trace mastoid fluid is unchanged. Grossly negative visualized internal auditory structures. Trace paranasal sinus mucosal thickening is stable. Orbit and scalp soft tissues are stable. Negative visualized cervical spine. Normal bone marrow signal. MRA HEAD FINDINGS Loss of flow signal in the distal left vertebral  artery. Antegrade flow in the distal right vertebral artery. Right PICA appears patent. Patent basilar artery with up to mild stenosis distally. Bilateral AICA and SCA origins are patent. Fetal type bilateral PCA origins. Bilateral PCA branches are within normal limits for age. Antegrade flow in both ICA siphons. Mild to moderate siphon dolichoectasia with no siphon stenosis. Ophthalmic and posterior communicating artery origins appear normal. Patent carotid termini. Normal MCA and ACA origins. Anterior communicating artery and visualized bilateral ACA branches are within normal limits. The left MCA bifurcation is a ectatic without discrete saccular aneurysm. Otherwise the visualized bilateral MCA branches are within normal limits. IMPRESSION: 1. Intermittently motion degraded despite repeated imaging attempts. 2.  No acute infarct or acute intracranial abnormality identified. 3. Chronic distal left vertebral artery occlusion. Otherwise negative for age intracranial MRA. Electronically Signed   By: Genevie Ann M.D.   On: 07/31/2015 19:24   Dg Chest Port 1 View  07/31/2015  CLINICAL DATA:  81 year old presenting with acute mental status changes and lethargy. EXAM: PORTABLE CHEST 1 VIEW COMPARISON:  07/06/2015 and earlier. FINDINGS: Airspace consolidation in the left lower lobe with silhouetting the left hemidiaphragm, similar in appearance to the most recent examination 3+ weeks ago. No new pulmonary parenchymal abnormalities elsewhere. Cardiac silhouette mildly to moderately enlarged, unchanged. Mild pulmonary venous hypertension without overt edema. IMPRESSION: 1. Left lower lobe atelectasis and/or pneumonia, not significantly changed since 07/06/2015. 2. No new abnormalities. Electronically Signed   By: Evangeline Dakin M.D.   On: 07/31/2015 13:20   Dg Abd Portable 2v  07/06/2015  CLINICAL DATA:  Right lower quadrant abdominal pain and vomiting today EXAM: PORTABLE ABDOMEN - 2 VIEW COMPARISON:  09/04/2014 CT  abdomen/pelvis FINDINGS: No disproportionately dilated small bowel loops or fluid levels to suggest small bowel obstruction. Mild diffuse gaseous distention of the stool filled colon and rectum. No evidence of pneumatosis or pneumoperitoneum. Mild patchy opacity at the left lung base. Marked degenerative changes throughout the visualized thoracolumbar spine and atherosclerotic calcifications throughout the abdomen and pelvis. IMPRESSION: Nonobstructive bowel gas pattern.  No free air. Patchy left lung base opacity, correlate with chest radiograph. Electronically Signed   By: Ilona Sorrel M.D.   On: 07/06/2015 20:44   Dg Swallowing Func-speech Pathology  07/09/2015  Objective Swallowing Evaluation:   MBS Patient Details Name: BYANCA YOUNGBLOOD MRN: [id number] Date of Birth: March 11, 1921 Today's Date: 07/09/2015 Time: SLP Start Time (ACUTE ONLY): 1350-SLP Stop Time (ACUTE ONLY): 1410 SLP Time Calculation (min) (ACUTE ONLY): 20 min Past  Medical History: Past Medical History Diagnosis Date . POSTHERPETIC NEURALGIA 11/02/2009 . B12 DEFICIENCY 05/23/2007 . HYPERLIPIDEMIA 05/23/2007 . HYPERKALEMIA 05/30/2010 . ANEMIA-IRON DEFICIENCY 01/26/2007 . BLEPHARITIS, BILATERAL 02/23/2008 . Other specified forms of hearing loss 05/30/2010 . HYPERTENSION 01/26/2007 . Atrial fibrillation ([place]) 05/23/2007 . DIASTOLIC HEART FAILURE, CHRONIC 12/14/2009 . POSTURAL HYPOTENSION 02/07/2008 . ALLERGIC RHINITIS 11/02/2009 . GERD 01/26/2007 . CONSTIPATION 01/25/2008 . SKIN LESION 05/30/2010 . BACK PAIN 08/02/2007 . OSTEOPOROSIS 05/23/2007 . SYNCOPE 02/07/2008 . FATIGUE 09/06/2007 . Dysuria 09/05/2008 . Abdominal pain, epigastric 09/06/2007 . EPIGASTRIC TENDERNESS 10/12/2007 . PULMONARY EMBOLISM, HX OF 05/23/2007 . SHINGLES, HX OF 05/23/2007 . 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 Left foot pain  . Diabetic neuropathy ([place])  . RENAL INSUFFICIENCY 07/02/2009 . RENAL CYST, LEFT 09/06/2007 . Kidney stones    "never had OR" . Family history of anesthesia complication    "daughter has PONV;  another daughter a little anesthesia lasts way too long" . CHF (congestive heart failure) ([place]) 2011 . Myocardial infarction [facility]) 2011   "mild" . DVT (deep venous thrombosis) ([place]) ~ 1964   "think it was in the left" . Aspiration pneumonia ([place]) ~ 2006   "due to aspiration" . DIABETES MELLITUS, TYPE II 09/06/2007 . DEPRESSIVE DISORDER 01/25/2008   "@ times; never treated for it" . Melanoma of nose ([place])  . Compression fracture of lumbar vertebra ([facility])    hx . Compression fracture of thoracic vertebra ([place]) 02/07/2014   "fell this am" (02/07/2014 . Arthritis    "hands" (03/15/2014) . Foley catheter in place on admission  Past Surgical History: Past Surgical History Procedure Laterality Date . Appendectomy  1943 . Abdominal hysterectomy  1964 . Cardiac electrophysiology mapping and ablation  1999 . Cataract extraction w/ intraocular lens  implant, bilateral  2003-2005 . Carpal tunnel release Right 2004 . Mohs surgery  2011   "removed off her nose" . Cardiac catheterization  10/2009 . Esophageal dilation  X 2 HPI: Other Pertinent Information: Pt is a 81 year old female with complex PMH including dementia, recently diagnosed with UTI and treated with keflex 5 days ago, who presents with lethargy and increased confusion per the patient's family. CT of head negative for acute intracranial abnormalities. CT of abdomen significant for patchy left lung base opacity.  No Data Recorded Assessment / Plan / Recommendation CHL IP CLINICAL IMPRESSIONS 07/09/2015 Therapy Diagnosis (None) Clinical Impression Decreased labial seal and control resulted in right anterior spillage; prolonged manipulation and transit with cracker (no upper dentition).  Mild motor based pharyngeal dysphagia, decreased laryngeal closure with silent penetration with nectar x 1 and silent aspiration with thin (chin tuck strategy ineffective). Mild vallecular and pyriform sinus residue reduced with verbal cues for second swallow. Decreased oral control if larger sip  consumed increases aspiration risk. Recommend Dys 2 diet and nectar thick liquids, no straws and sit upright.      CHL IP TREATMENT RECOMMENDATION 07/07/2015 Treatment Recommendations Therapy as outlined in treatment plan below   CHL IP DIET RECOMMENDATION 07/09/2015 SLP Diet Recommendations (None) Liquid Administration via (None) Medication Administration Crushed with puree Compensations Slow rate;Small sips/bites Postural Changes and/or Swallow Maneuvers Seated upright 90 degrees       CHL IP FREQUENCY AND DURATION 07/07/2015 Speech Therapy Frequency (ACUTE ONLY) min 2x/week Treatment Duration 2 weeks   Pertinent Vitals/Pain none  SLP Swallow Goals No flowsheet data found. No flowsheet data found.   CHL IP REASON FOR REFERRAL 07/09/2015 Reason for Referral Objectively evaluate swallowing function  No flowsheet data found.   Houston Siren 07/09/2015, 3:17 PM Orbie Pyo Colvin Caroli.Ed CCC-SLP Pager [phone]   Mr Jodene Nam Head/brain Wo Cm  07/31/2015  CLINICAL DATA:  81 year old female with worsening confusion since yesterday. Acute encephalopathy. Myoclonic jerking. Initial encounter. EXAM: MRI HEAD WITHOUT CONTRAST MRA HEAD WITHOUT CONTRAST TECHNIQUE: Multiplanar, multiecho pulse sequences of the brain and surrounding structures were obtained without intravenous contrast. Angiographic images of the head were obtained using MRA technique without contrast. COMPARISON:  Head CT without contrast 1354 hours today and earlier. Brain MRI 03/21/2014, and 11/15/2004. FINDINGS: MRI HEAD FINDINGS Cerebral volume is stable. Major intracranial vascular flow voids are stable, with chronic absence of the distal left vertebral artery flow void (see also 11/15/2004 series 3, image 4). No restricted diffusion to suggest acute infarction. No midline shift, mass effect, evidence of mass lesion, ventriculomegaly, extra-axial collection or acute intracranial hemorrhage. Cervicomedullary junction and pituitary are within normal  limits. [place] Cables and white matter signal throughout the brain appears stable and normal for age. Axial T1 weighted images are severely degraded by motion despite repeated attempts. Trace mastoid fluid is unchanged. Grossly negative visualized internal auditory structures. Trace paranasal sinus mucosal thickening is stable. Orbit and scalp soft tissues are stable. Negative visualized cervical spine. Normal bone marrow signal. MRA HEAD FINDINGS Loss of flow signal in the distal left vertebral artery. Antegrade flow in the distal right vertebral artery. Right PICA appears patent. Patent basilar artery with up to mild stenosis distally. Bilateral AICA and SCA origins are patent. Fetal type bilateral PCA origins. Bilateral PCA branches are within normal limits for age. Antegrade flow in both ICA siphons. Mild to moderate siphon dolichoectasia with no siphon stenosis. Ophthalmic and posterior communicating artery origins appear normal. Patent carotid termini. Normal MCA and ACA origins. Anterior communicating artery and visualized bilateral ACA branches are within normal limits. The left MCA bifurcation is a ectatic without discrete saccular aneurysm. Otherwise the visualized bilateral MCA branches are within normal limits. IMPRESSION: 1. Intermittently motion degraded despite repeated imaging attempts. 2.  No acute infarct or acute intracranial abnormality identified. 3. Chronic distal left vertebral artery occlusion. Otherwise negative for age intracranial MRA. Electronically Signed   By: Genevie Ann M.D.   On: 07/31/2015 19:24     CBC  Recent Labs Lab 07/31/15 1248 08/01/15 0507  WBC 10.6* 14.3*  HGB 11.1* 11.5*  HCT 35.6* 35.8*  PLT 169 200  MCV 88.3 86.9  MCH 27.5 27.9  MCHC 31.2 32.1  RDW 16.0* 16.0*  LYMPHSABS 0.6*  --   MONOABS 1.0  --   EOSABS 0.1  --   BASOSABS 0.0  --     Chemistries   Recent Labs Lab 07/31/15 1248 08/01/15 0507  NA 136 139  K 4.4 3.9  CL 103 105  CO2 25 27  GLUCOSE  307* 83  BUN 27* 22*  CREATININE 1.35* 1.20*  CALCIUM 9.6 9.4  AST 22  --   ALT 29  --   ALKPHOS 74  --   BILITOT 0.7  --    ------------------------------------------------------------------------------------------------------------------ estimated creatinine clearance is 26.9 mL/min (by C-G formula based on Cr of 1.2). ------------------------------------------------------------------------------------------------------------------ No results for input(s): HGBA1C in the last 72 hours. ------------------------------------------------------------------------------------------------------------------  Recent Labs  08/01/15 0507  CHOL 115  HDL 40*  LDLCALC 58  TRIG 86  CHOLHDL 2.9   ------------------------------------------------------------------------------------------------------------------ No results for input(s): TSH, T4TOTAL, T3FREE, THYROIDAB in the last 72 hours.  Invalid input(s): FREET3 ------------------------------------------------------------------------------------------------------------------ No results for  input(s): VITAMINB12, FOLATE, FERRITIN, TIBC, IRON, RETICCTPCT in the last 72 hours.  Coagulation profile  Recent Labs Lab 07/31/15 1536 07/31/15 1719 08/01/15 0507  INR 1.80* 1.94* 2.12*    No results for input(s): DDIMER in the last 72 hours.  Cardiac Enzymes No results for input(s): CKMB, TROPONINI, MYOGLOBIN in the last 168 hours.  Invalid input(s): CK ------------------------------------------------------------------------------------------------------------------ Invalid input(s): POCBNP   CBG:  Recent Labs Lab 07/31/15 2057 07/31/15 2300 08/01/15 0510  GLUCAP 150* 112* 80       Studies: Ct Head Wo Contrast  07/31/2015  CLINICAL DATA:  Altered mental status; lethargic EXAM: CT HEAD WITHOUT CONTRAST TECHNIQUE: Contiguous axial images were obtained from the base of the skull through the vertex without intravenous contrast.  COMPARISON:  July 06, 2015 FINDINGS: Mild diffuse atrophy is stable. There is no intracranial mass, hemorrhage, extra-axial fluid collection, or midline shift. There is rather minimal small vessel disease in the centra semiovale bilaterally. Elsewhere gray-white compartments appear normal. No acute infarct is evident. Bony calvarium appears intact. The mastoid air cells are clear. There are no intraorbital lesions appreciable. IMPRESSION: Mild atrophy with rather minimal periventricular small vessel disease, stable. No acute infarct evident. No hemorrhage or mass effect. Electronically Signed   By: Lowella Grip III M.D.   On: 07/31/2015 14:04   Mr Brain Wo Contrast  07/31/2015  CLINICAL DATA:  81 year old female with worsening confusion since yesterday. Acute encephalopathy. Myoclonic jerking. Initial encounter. EXAM: MRI HEAD WITHOUT CONTRAST MRA HEAD WITHOUT CONTRAST TECHNIQUE: Multiplanar, multiecho pulse sequences of the brain and surrounding structures were obtained without intravenous contrast. Angiographic images of the head were obtained using MRA technique without contrast. COMPARISON:  Head CT without contrast 1354 hours today and earlier. Brain MRI 03/21/2014, and 11/15/2004. FINDINGS: MRI HEAD FINDINGS Cerebral volume is stable. Major intracranial vascular flow voids are stable, with chronic absence of the distal left vertebral artery flow void (see also 11/15/2004 series 3, image 4). No restricted diffusion to suggest acute infarction. No midline shift, mass effect, evidence of mass lesion, ventriculomegaly, extra-axial collection or acute intracranial hemorrhage. Cervicomedullary junction and pituitary are within normal limits. [place] Cables and white matter signal throughout the brain appears stable and normal for age. Axial T1 weighted images are severely degraded by motion despite repeated attempts. Trace mastoid fluid is unchanged. Grossly negative visualized internal auditory structures. Trace  paranasal sinus mucosal thickening is stable. Orbit and scalp soft tissues are stable. Negative visualized cervical spine. Normal bone marrow signal. MRA HEAD FINDINGS Loss of flow signal in the distal left vertebral artery. Antegrade flow in the distal right vertebral artery. Right PICA appears patent. Patent basilar artery with up to mild stenosis distally. Bilateral AICA and SCA origins are patent. Fetal type bilateral PCA origins. Bilateral PCA branches are within normal limits for age. Antegrade flow in both ICA siphons. Mild to moderate siphon dolichoectasia with no siphon stenosis. Ophthalmic and posterior communicating artery origins appear normal. Patent carotid termini. Normal MCA and ACA origins. Anterior communicating artery and visualized bilateral ACA branches are within normal limits. The left MCA bifurcation is a ectatic without discrete saccular aneurysm. Otherwise the visualized bilateral MCA branches are within normal limits. IMPRESSION: 1. Intermittently motion degraded despite repeated imaging attempts. 2.  No acute infarct or acute intracranial abnormality identified. 3. Chronic distal left vertebral artery occlusion. Otherwise negative for age intracranial MRA. Electronically Signed   By: Genevie Ann M.D.   On: 07/31/2015 19:24   Dg Chest [facility]  07/31/2015  CLINICAL DATA:  81 year old presenting with acute mental status changes and lethargy. EXAM: PORTABLE CHEST 1 VIEW COMPARISON:  07/06/2015 and earlier. FINDINGS: Airspace consolidation in the left lower lobe with silhouetting the left hemidiaphragm, similar in appearance to the most recent examination 3+ weeks ago. No new pulmonary parenchymal abnormalities elsewhere. Cardiac silhouette mildly to moderately enlarged, unchanged. Mild pulmonary venous hypertension without overt edema. IMPRESSION: 1. Left lower lobe atelectasis and/or pneumonia, not significantly changed since 07/06/2015. 2. No new abnormalities. Electronically Signed    By: Evangeline Dakin M.D.   On: 07/31/2015 13:20   Mr Jodene Nam Head/brain Wo Cm  07/31/2015  CLINICAL DATA:  81 year old female with worsening confusion since yesterday. Acute encephalopathy. Myoclonic jerking. Initial encounter. EXAM: MRI HEAD WITHOUT CONTRAST MRA HEAD WITHOUT CONTRAST TECHNIQUE: Multiplanar, multiecho pulse sequences of the brain and surrounding structures were obtained without intravenous contrast. Angiographic images of the head were obtained using MRA technique without contrast. COMPARISON:  Head CT without contrast 1354 hours today and earlier. Brain MRI 03/21/2014, and 11/15/2004. FINDINGS: MRI HEAD FINDINGS Cerebral volume is stable. Major intracranial vascular flow voids are stable, with chronic absence of the distal left vertebral artery flow void (see also 11/15/2004 series 3, image 4). No restricted diffusion to suggest acute infarction. No midline shift, mass effect, evidence of mass lesion, ventriculomegaly, extra-axial collection or acute intracranial hemorrhage. Cervicomedullary junction and pituitary are within normal limits. Pear[place] Cables and white matter signal throughout the brain appears stable and normal for age. Axial T1 weighted images are severely degraded by motion despite repeated attempts. Trace mastoid fluid is unchanged. Grossly negative visualized internal auditory structures. Trace paranasal sinus mucosal thickening is stable. Orbit and scalp soft tissues are stable. Negative visualized cervical spine. Normal bone marrow signal. MRA HEAD FINDINGS Loss of flow signal in the distal left vertebral artery. Antegrade flow in the distal right vertebral artery. Right PICA appears patent. Patent basilar artery with up to mild stenosis distally. Bilateral AICA and SCA origins are patent. Fetal type bilateral PCA origins. Bilateral PCA branches are within normal limits for age. Antegrade flow in both ICA siphons. Mild to moderate siphon dolichoectasia with no siphon stenosis. Ophthalmic  and posterior communicating artery origins appear normal. Patent carotid termini. Normal MCA and ACA origins. Anterior communicating artery and visualized bilateral ACA branches are within normal limits. The left MCA bifurcation is a ectatic without discrete saccular aneurysm. Otherwise the visualized bilateral MCA branches are within normal limits. IMPRESSION: 1. Intermittently motion degraded despite repeated imaging attempts. 2.  No acute infarct or acute intracranial abnormality identified. 3. Chronic distal left vertebral artery occlusion. Otherwise negative for age intracranial MRA. Electronically Signed   By: Genevie Ann M.D.   On: 07/31/2015 19:24      Lab Results  Component Value Date   HGBA1C 6.0 03/06/2015   HGBA1C 7.0* 08/08/2014   HGBA1C 7.5* 05/30/2014   Lab Results  Component Value Date   MICROALBUR 14.4* 03/06/2015   LDLCALC 58 08/01/2015   CREATININE 1.20* 08/01/2015       Scheduled Meds: .  stroke: mapping our early stages of recovery book   Does not apply Once  . benztropine  0.5 mg Oral Daily  . cloNIDine  0.3 mg Transdermal Weekly  . heparin  5,000 Units Subcutaneous 3 times per day  . insulin aspart  0-15 Units Subcutaneous Q6H  . labetalol  400 mg Oral BID  . pantoprazole  40 mg Oral Daily  . piperacillin-tazobactam (ZOSYN)  IV  3.375 g Intravenous Q8H  . polyethylene glycol  17 g Oral BID  . pravastatin  20 mg Oral q1800  . warfarin  3 mg Oral ONCE-1800  . Warfarin - Pharmacist Dosing Inpatient   Does not apply q1800   Continuous Infusions:   Principal Problem:   Sepsis secondary to UTI [facility]) Active Problems:   B12 deficiency   Hyperlipidemia   Hearing loss   Essential hypertension   Atrial fibrillation (HCC)   DIASTOLIC HEART FAILURE, CHRONIC   Protein-calorie malnutrition, severe (HCC)   Dementia   Diabetes mellitus type 2, controlled ([place])   Acute encephalopathy   Myoclonic jerking    Time spent: 45 minutes   [facility] Pager [phone]. If 7PM-7AM, please contact night-coverage at [email or web address], password [facility] 08/01/2015, 12:22 PM  LOS: 1 day

## 2015-08-01 NOTE — Patient Outreach (Signed)
[facility]) Care Management  08/01/2015  Lindsay Sheppard 1921/05/28 [id number]  Assessment-CSW made aware that patient has been admitted into the hospital on 07/31/15. CSW will contact patient's family post discharge.  Eula Fried, BSW, MSW, [email or web address] Phone: [phone] Fax: [phone]

## 2015-08-01 NOTE — Progress Notes (Signed)
SLP Cancellation Note  Patient Details Name: LEMPI FALSETTI MRN: [id number] DOB: 03-24-1921   Cancelled treatment:       Reason Eval/Treat Not Completed: Fatigue/lethargy limiting ability to participate;Patient at procedure or test/unavailable (EEG). Will attempt evaluation later this afternoon.  Lanier Ensign, Student-SLP  Lanier Ensign 08/01/2015, 9:48 AM

## 2015-08-01 NOTE — Progress Notes (Signed)
UR COMPLETED  

## 2015-08-01 NOTE — Progress Notes (Signed)
Subjective: Resting comfortably and awakens easily. Knows she is in the hospital but not which one.  Able to follow simple commands. No complaints.   Objective: Current vital signs: BP 150/86 mmHg  Pulse 101  Temp(Src) 98.5 F (36.9 C) (Oral)  Resp 21  Ht 5\' 5"  (1.651 m)  Wt 63.504 kg (140 lb)  BMI 23.30 kg/m2  SpO2 98% Vital signs in last 24 hours: Temp:  [98.1 F (36.7 C)-99.8 F (37.7 C)] 98.5 F (36.9 C) (11/30 0700) Pulse Rate:  [27-135] 101 (11/30 0900) Resp:  [16-31] 21 (11/30 0900) BP: (114-216)/(62-116) 150/86 mmHg (11/30 0900) SpO2:  [95 %-100 %] 98 % (11/30 0900) Weight:  [63.504 kg (140 lb)] 63.504 kg (140 lb) (11/29 1224)  Intake/Output from previous day: 11/29 0701 - 11/30 0700 In: 100 [IV Piggyback:100] Out: 500 [Urine:500] Intake/Output this shift:   Nutritional status: Diet NPO time specified  Neurologic Exam: General: NAD Mental Status: Alert, not oriented.  Speech fluent without evidence of aphasia.  Able to follow simple commands without difficulty. Cranial Nerves: II:  Visual fields grossly normal, pupils equal, round, reactive to light and accommodation III,IV, VI: ptosis not present, extra-ocular motions intact bilaterally V,VII: smile symmetric, facial light touch sensation normal bilaterally VIII: hearing normal bilaterally IX,X: uvula rises symmetrically XI: bilateral shoulder shrug XII: midline tongue extension without atrophy or fasciculations  Motor: Moving all extremities antigravity with postural tremor of bilateral UE and asterixis.  Sensory: withdraws to pain throughout Deep Tendon Reflexes:  1+ throughout  Plantars: Right: downgoing   Left: downgoing   Lab Results: Basic Metabolic Panel:  Recent Labs Lab 07/31/15 1248 08/01/15 0507  NA 136 139  K 4.4 3.9  CL 103 105  CO2 25 27  GLUCOSE 307* 83  BUN 27* 22*  CREATININE 1.35* 1.20*  CALCIUM 9.6 9.4    Liver Function Tests:  Recent Labs Lab 07/31/15 1248  AST  22  ALT 29  ALKPHOS 74  BILITOT 0.7  PROT 5.9*  ALBUMIN 3.3*   No results for input(s): LIPASE, AMYLASE in the last 168 hours.  Recent Labs Lab 07/31/15 1537  AMMONIA 11    CBC:  Recent Labs Lab 07/31/15 1248 08/01/15 0507  WBC 10.6* 14.3*  NEUTROABS 8.9*  --   HGB 11.1* 11.5*  HCT 35.6* 35.8*  MCV 88.3 86.9  PLT 169 200    Cardiac Enzymes: No results for input(s): CKTOTAL, CKMB, CKMBINDEX, TROPONINI in the last 168 hours.  Lipid Panel:  Recent Labs Lab 08/01/15 0507  CHOL 115  TRIG 86  HDL 40*  CHOLHDL 2.9  VLDL 17  LDLCALC 58    CBG:  Recent Labs Lab 07/31/15 2057 07/31/15 2300 08/01/15 0510  GLUCAP 150* 112* 80    Microbiology: Results for orders placed or performed during the hospital encounter of 07/31/15  Urine culture     Status: None (Preliminary result)   Collection Time: 07/31/15  3:15 PM  Result Value Ref Range Status   Specimen Description URINE, RANDOM  Final   Special Requests NONE  Final   Culture >=100,000 COLONIES/mL GRAM NEGATIVE RODS  Final   Report Status PENDING  Incomplete  MRSA PCR Screening     Status: None   Collection Time: 07/31/15  8:52 PM  Result Value Ref Range Status   MRSA by PCR NEGATIVE NEGATIVE Final    Comment:        The GeneXpert MRSA Assay (FDA approved for NASAL specimens only), is one component of a  comprehensive MRSA colonization surveillance program. It is not intended to diagnose MRSA infection nor to guide or monitor treatment for MRSA infections.     Coagulation Studies:  Recent Labs  07/31/15 1536 07/31/15 1719 08/01/15 0507  LABPROT 20.8* 22.0* 23.6*  INR 1.80* 1.94* 2.12*    Imaging: Ct Head Wo Contrast  07/31/2015  CLINICAL DATA:  Altered mental status; lethargic EXAM: CT HEAD WITHOUT CONTRAST TECHNIQUE: Contiguous axial images were obtained from the base of the skull through the vertex without intravenous contrast. COMPARISON:  July 06, 2015 FINDINGS: Mild diffuse  atrophy is stable. There is no intracranial mass, hemorrhage, extra-axial fluid collection, or midline shift. There is rather minimal small vessel disease in the centra semiovale bilaterally. Elsewhere gray-white compartments appear normal. No acute infarct is evident. Bony calvarium appears intact. The mastoid air cells are clear. There are no intraorbital lesions appreciable. IMPRESSION: Mild atrophy with rather minimal periventricular small vessel disease, stable. No acute infarct evident. No hemorrhage or mass effect. Electronically Signed   By: Lowella Grip III M.D.   On: 07/31/2015 14:04   Mr Brain Wo Contrast  07/31/2015  CLINICAL DATA:  81 year old female with worsening confusion since yesterday. Acute encephalopathy. Myoclonic jerking. Initial encounter. EXAM: MRI HEAD WITHOUT CONTRAST MRA HEAD WITHOUT CONTRAST TECHNIQUE: Multiplanar, multiecho pulse sequences of the brain and surrounding structures were obtained without intravenous contrast. Angiographic images of the head were obtained using MRA technique without contrast. COMPARISON:  Head CT without contrast 1354 hours today and earlier. Brain MRI 03/21/2014, and 11/15/2004. FINDINGS: MRI HEAD FINDINGS Cerebral volume is stable. Major intracranial vascular flow voids are stable, with chronic absence of the distal left vertebral artery flow void (see also 11/15/2004 series 3, image 4). No restricted diffusion to suggest acute infarction. No midline shift, mass effect, evidence of mass lesion, ventriculomegaly, extra-axial collection or acute intracranial hemorrhage. Cervicomedullary junction and pituitary are within normal limits. [place] Cables and white matter signal throughout the brain appears stable and normal for age. Axial T1 weighted images are severely degraded by motion despite repeated attempts. Trace mastoid fluid is unchanged. Grossly negative visualized internal auditory structures. Trace paranasal sinus mucosal thickening is stable. Orbit  and scalp soft tissues are stable. Negative visualized cervical spine. Normal bone marrow signal. MRA HEAD FINDINGS Loss of flow signal in the distal left vertebral artery. Antegrade flow in the distal right vertebral artery. Right PICA appears patent. Patent basilar artery with up to mild stenosis distally. Bilateral AICA and SCA origins are patent. Fetal type bilateral PCA origins. Bilateral PCA branches are within normal limits for age. Antegrade flow in both ICA siphons. Mild to moderate siphon dolichoectasia with no siphon stenosis. Ophthalmic and posterior communicating artery origins appear normal. Patent carotid termini. Normal MCA and ACA origins. Anterior communicating artery and visualized bilateral ACA branches are within normal limits. The left MCA bifurcation is a ectatic without discrete saccular aneurysm. Otherwise the visualized bilateral MCA branches are within normal limits. IMPRESSION: 1. Intermittently motion degraded despite repeated imaging attempts. 2.  No acute infarct or acute intracranial abnormality identified. 3. Chronic distal left vertebral artery occlusion. Otherwise negative for age intracranial MRA. Electronically Signed   By: Genevie Ann M.D.   On: 07/31/2015 19:24   Dg Chest Port 1 View  07/31/2015  CLINICAL DATA:  81 year old presenting with acute mental status changes and lethargy. EXAM: PORTABLE CHEST 1 VIEW COMPARISON:  07/06/2015 and earlier. FINDINGS: Airspace consolidation in the left lower lobe with silhouetting the left hemidiaphragm, similar in  appearance to the most recent examination 3+ weeks ago. No new pulmonary parenchymal abnormalities elsewhere. Cardiac silhouette mildly to moderately enlarged, unchanged. Mild pulmonary venous hypertension without overt edema. IMPRESSION: 1. Left lower lobe atelectasis and/or pneumonia, not significantly changed since 07/06/2015. 2. No new abnormalities. Electronically Signed   By: Evangeline Dakin M.D.   On: 07/31/2015 13:20    Mr Jodene Nam Head/brain Wo Cm  07/31/2015  CLINICAL DATA:  81 year old female with worsening confusion since yesterday. Acute encephalopathy. Myoclonic jerking. Initial encounter. EXAM: MRI HEAD WITHOUT CONTRAST MRA HEAD WITHOUT CONTRAST TECHNIQUE: Multiplanar, multiecho pulse sequences of the brain and surrounding structures were obtained without intravenous contrast. Angiographic images of the head were obtained using MRA technique without contrast. COMPARISON:  Head CT without contrast 1354 hours today and earlier. Brain MRI 03/21/2014, and 11/15/2004. FINDINGS: MRI HEAD FINDINGS Cerebral volume is stable. Major intracranial vascular flow voids are stable, with chronic absence of the distal left vertebral artery flow void (see also 11/15/2004 series 3, image 4). No restricted diffusion to suggest acute infarction. No midline shift, mass effect, evidence of mass lesion, ventriculomegaly, extra-axial collection or acute intracranial hemorrhage. Cervicomedullary junction and pituitary are within normal limits. [place] Cables and white matter signal throughout the brain appears stable and normal for age. Axial T1 weighted images are severely degraded by motion despite repeated attempts. Trace mastoid fluid is unchanged. Grossly negative visualized internal auditory structures. Trace paranasal sinus mucosal thickening is stable. Orbit and scalp soft tissues are stable. Negative visualized cervical spine. Normal bone marrow signal. MRA HEAD FINDINGS Loss of flow signal in the distal left vertebral artery. Antegrade flow in the distal right vertebral artery. Right PICA appears patent. Patent basilar artery with up to mild stenosis distally. Bilateral AICA and SCA origins are patent. Fetal type bilateral PCA origins. Bilateral PCA branches are within normal limits for age. Antegrade flow in both ICA siphons. Mild to moderate siphon dolichoectasia with no siphon stenosis. Ophthalmic and posterior communicating artery origins appear  normal. Patent carotid termini. Normal MCA and ACA origins. Anterior communicating artery and visualized bilateral ACA branches are within normal limits. The left MCA bifurcation is a ectatic without discrete saccular aneurysm. Otherwise the visualized bilateral MCA branches are within normal limits. IMPRESSION: 1. Intermittently motion degraded despite repeated imaging attempts. 2.  No acute infarct or acute intracranial abnormality identified. 3. Chronic distal left vertebral artery occlusion. Otherwise negative for age intracranial MRA. Electronically Signed   By: Genevie Ann M.D.   On: 07/31/2015 19:24    Medications:  Scheduled: .  stroke: mapping our early stages of recovery book   Does not apply Once  . benztropine  0.5 mg Oral Daily  . cloNIDine  0.3 mg Transdermal Weekly  . heparin  5,000 Units Subcutaneous 3 times per day  . insulin aspart  0-15 Units Subcutaneous Q6H  . labetalol  400 mg Oral BID  . pantoprazole  40 mg Oral Daily  . piperacillin-tazobactam (ZOSYN)  IV  3.375 g Intravenous Q8H  . polyethylene glycol  17 g Oral BID  . pravastatin  20 mg Oral q1800  . warfarin  3 mg Oral ONCE-1800  . Warfarin - Pharmacist Dosing Inpatient   Does not apply q1800    Assessment/Plan: 81 y/o with multiple medical problems, recently discharged from the hospital due to acute toxic metabolic encephalopathy. UA shows multiple bacteria, leukocytes. WBC increased to 14 over night. Currently showing a non-focal exam other than postural tremor with superimposed asterixis. As noted prior MRI  shows no acute stroke. EEG pending.   Recommend: 1) awaiting EEG 2) If no epileptiform activity noted. Recommend treat underlying infection. Hold Mirtazapine.  And neurology will S/O       Etta Quill PA-C [facility] [phone]  08/01/2015, 11:16 AM

## 2015-08-01 NOTE — Progress Notes (Addendum)
Clinical/Bedside Swallow Evaluation Patient Details  Name: Lindsay Sheppard MRN: [id number] Date of Birth: [birth year]  Today's Date: 08/01/2015 Time: SLP Start Time (ACUTE ONLY): 1255 SLP Stop Time (ACUTE ONLY): 1320 SLP Time Calculation (min) (ACUTE ONLY): 25 min  Past Medical History:  Past Medical History  Diagnosis Date  . POSTHERPETIC NEURALGIA 11/02/2009  . B12 DEFICIENCY 05/23/2007  . HYPERLIPIDEMIA 05/23/2007  . HYPERKALEMIA 05/30/2010  . ANEMIA-IRON DEFICIENCY 01/26/2007  . BLEPHARITIS, BILATERAL 02/23/2008  . Other specified forms of hearing loss 05/30/2010  . HYPERTENSION 01/26/2007  . Atrial fibrillation ([place]) 05/23/2007  . DIASTOLIC HEART FAILURE, CHRONIC 12/14/2009  . POSTURAL HYPOTENSION 02/07/2008  . ALLERGIC RHINITIS 11/02/2009  . GERD 01/26/2007  . CONSTIPATION 01/25/2008  . SKIN LESION 05/30/2010  . BACK PAIN 08/02/2007  . OSTEOPOROSIS 05/23/2007  . SYNCOPE 02/07/2008  . FATIGUE 09/06/2007  . Dysuria 09/05/2008  . Abdominal pain, epigastric 09/06/2007  . EPIGASTRIC TENDERNESS 10/12/2007  . PULMONARY EMBOLISM, HX OF 05/23/2007  . SHINGLES, HX OF 05/23/2007  . 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 Left foot pain   . Diabetic neuropathy ([place])   . RENAL INSUFFICIENCY 07/02/2009  . RENAL CYST, LEFT 09/06/2007  . Kidney stones     "never had OR"  . Family history of anesthesia complication     "daughter has PONV; another daughter a little anesthesia lasts way too long"  . CHF (congestive heart failure) ([place]) 2011  . Myocardial infarction [facility]) 2011    "mild"  . DVT (deep venous thrombosis) ([place]) ~ 1964    "think it was in the left"  . Aspiration pneumonia ([place]) ~ 2006    "due to aspiration"  . DIABETES MELLITUS, TYPE II 09/06/2007  . DEPRESSIVE DISORDER 01/25/2008    "@ times; never treated for it"  . Melanoma of nose ([place])   . Compression fracture of lumbar vertebra ([facility])     hx  . Compression fracture of thoracic vertebra ([place]) 02/07/2014    "fell this am" (02/07/2014  . Arthritis      "hands" (03/15/2014)  . Foley catheter in place on admission    Past Surgical History:  Past Surgical History  Procedure Laterality Date  . Appendectomy  1943  . Abdominal hysterectomy  1964  . Cardiac electrophysiology mapping and ablation  1999  . Cataract extraction w/ intraocular lens  implant, bilateral  2003-2005  . Carpal tunnel release Right 2004  . Mohs surgery  2011    "removed off her nose"  . Cardiac catheterization  10/2009  . Esophageal dilation  X 2   HPI:  Lindsay Sheppard is a 81 y.o. 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 The patient presented with complains of worsening of confusion starting 07/30/15, suspect sepsis due to UTI. Pt has a chronic foley catheter. Pt was recently hospitalized for acute encephalopathy which whas thought to be secondary to pneumonia and UTI. MBS 07/09/15 recommended dysphagia 2, nectar-thick liquids due to decreased laryngeal closure with silent penetration of nectar x1 and silent aspiration with thin, pt was discharged on dysphagia diet. Current CXR, showed left lower lobe atelectasis and/or pneumonia, not significantly changed since 07/06/2015. Current MRI showed no acute finding.    Assessment / Plan / Recommendation Clinical Impression  Patient demonstrates fatigue and lethargy which limited her ability to particiapte in BSE safely. Patient required constant verbal and tactile cues to  maintain arousal for ~20-30 seconds at a time despite Max attempts to arouse (oral care, cold cloth, etc). Patient consumed minimal ice chips and demonstrated reduced labial seal with impaired AP transit and a suspected delayed swallow initiation. Patient did not demonstrate any overt s/s of aspiration, however, patient was a h/o of silent aspiration with thin liquids. Recommend patient remain NPO until  mentation and overall arousal improves. Patient's daughter present and verbalized understanding and agreement.     Aspiration Risk  Severe aspiration risk    Diet Recommendation NPO       Other  Recommendations Oral Care Recommendations: Oral care QID   Follow up Recommendations  Skilled Nursing facility    Frequency and Duration min 2x/week  2 weeks       Prognosis Prognosis for Safe Diet Advancement: Fair Barriers to Reach Goals: Severity of deficits      Swallow Study   General Date of Onset: 07/31/15 HPI: Lindsay Sheppard is a 81 y.o. 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 The patient presented with complains of worsening of confusion starting 07/30/15, suspect sepsis due to UTI. Pt has a chronic foley catheter. Pt was recently hospitalized for acute encephalopathy which whas thought to be secondary to pneumonia and UTI. MBS 07/09/15 recommended dysphagia 2, nectar-thick liquids due to decreased laryngeal closure with silent penetration of nectar x1 and silent aspiration with thin, pt was discharged on dysphagia diet. Current CXR, showed left lower lobe atelectasis and/or pneumonia, not significantly changed since 07/06/2015. Current MRI showed no acute finding.  Type of Study: Bedside Swallow Evaluation Previous Swallow Assessment: Per family, patient had FEES while at SNF and was recently upgraded to thin liquids ~1 week ago  Diet Prior to this Study: NPO Temperature Spikes Noted: No Respiratory Status: Room air History of Recent Intubation: No Behavior/Cognition: Confused;Lethargic/Drowsy;Requires cueing Oral Cavity Assessment: Dry Oral Care Completed by SLP: Yes Oral Cavity - Dentition: Edentulous (unable to wear dentures at this time ) Self-Feeding Abilities: Total assist Patient Positioning: Upright in  bed Volitional Cough: Cognitively unable to elicit Volitional Swallow: Unable to elicit    Oral/Motor/Sensory Function Overall Oral Motor/Sensory Function: Generalized oral weakness (limited due to fatigue and lethargy )   Ice Chips Ice chips: Impaired Presentation: Spoon Oral Phase Impairments: Reduced labial seal;Reduced lingual movement/coordination Oral Phase Functional Implications: Prolonged oral transit Pharyngeal Phase Impairments: Suspected delayed Swallow   Thin Liquid Thin Liquid: Not tested    Nectar Thick Nectar Thick Liquid: Not tested   Honey Thick Honey Thick Liquid: Not tested   Puree Puree: Not tested   Solid Solid: Not tested       Bartholomew Ramesh 08/01/2015,1:40 PM

## 2015-08-01 NOTE — Progress Notes (Signed)
ANTICOAGULATION CONSULT NOTE - Follow Up Consult  Pharmacy Consult for Heparin when INR <2 Indication: hx atrial fibrillation, pulmonary embolus and DVT  Allergies  Allergen Reactions  . Amiodarone Other (See Comments)    REACTION: f? fluid retention  . Deltasone [Prednisone] Other (See Comments)    unknown  . Diltiazem Hcl Other (See Comments)    unknown  . Levofloxacin Nausea And Vomiting    REACTION: nausea  . Pregabalin Other (See Comments)    REACTION: hallucinations  . Spironolactone Other (See Comments)    REACTION: elevated K at lowest dose  . Tequin [Gatifloxacin] Other (See Comments)    Hypoglycemia  . Diazepam Anxiety and Other (See Comments)    REACTION: agitation    Patient Measurements: Height: 5\' 5"  (165.1 cm) Weight: 140 lb (63.504 kg) IBW/kg (Calculated) : 57 Heparin Dosing Weight: 63 kg  Vital Signs: Temp: 98.8 F (37.1 C) (11/30 1237) Temp Source: Axillary (11/30 1237) BP: 132/77 mmHg (11/30 1100) Pulse Rate: 90 (11/30 1100)  Labs:  Recent Labs  07/31/15 1248 07/31/15 1536 07/31/15 1719 08/01/15 0507  HGB 11.1*  --   --  11.5*  HCT 35.6*  --   --  35.8*  PLT 169  --   --  200  APTT  --  48* 46*  --   LABPROT  --  20.8* 22.0* 23.6*  INR  --  1.80* 1.94* 2.12*  CREATININE 1.35*  --   --  1.20*    Estimated Creatinine Clearance: 26.9 mL/min (by C-G formula based on Cr of 1.2).   Medications:  Prescriptions prior to admission  Medication Sig Dispense Refill Last Dose  . acetaminophen (TYLENOL) 325 MG tablet Take 2 tablets (650 mg total) by mouth 3 (three) times daily.   07/31/2015 at 0600  . AMBULATORY NON FORMULARY MEDICATION Medication Name: Med pass 120 cc by mouth two times daily in between meals   07/30/2015 at Unknown time  . benztropine (COGENTIN) 0.5 MG tablet Take 0.5 mg by mouth daily. For involuntary jerks   07/30/2015 at Unknown time  . cloNIDine (CATAPRES - DOSED IN MG/24 HR) 0.3 mg/24hr patch Place 0.3 mg onto the skin once  a week. Wednesdays   07/25/2015  . CRANBERRY PO Take 500 mg by mouth daily.    07/30/2015 at Unknown time  . cyanocobalamin (,VITAMIN B-12,) 1000 MCG/ML injection INJECT 1 ML INTO THE MUSCLE EVERY 30 DAYS 1 mL 11 over 30 days  . ferrous sulfate 325 (65 FE) MG tablet Take 1 tablet (325 mg total) by mouth 2 (two) times daily with a meal. 60 tablet 1 07/30/2015 at Unknown time  . furosemide (LASIX) 20 MG tablet 1 tab by mouth per day as needed for swelling (Patient taking differently: Take 20 mg by mouth daily as needed for fluid or edema. ) 90 tablet 3 over 30 days  . hydrALAZINE (APRESOLINE) 25 MG tablet Take 1 tablet (25 mg total) by mouth 3 (three) times daily. 270 tablet 3 07/31/2015 at Unknown time  . hydroxypropyl methylcellulose / hypromellose (ISOPTO TEARS / GONIOVISC) 2.5 % ophthalmic solution Place 1 drop into both eyes 3 (three) times daily as needed for dry eyes.   over 30 days  . insulin glargine (LANTUS) 100 UNIT/ML injection Inject 0.2 mLs (20 Units total) into the skin daily. 30 mL 3 07/30/2015 at Unknown time  . labetalol (NORMODYNE) 200 MG tablet Take 400 mg by mouth 2 (two) times daily.   07/30/2015 at 2100  .  pantoprazole (PROTONIX) 40 MG tablet TAKE 1 BY MOUTH DAILY (Patient taking differently: Take one tablet by mouth once daily for stomach) 90 tablet 3 07/31/2015 at Unknown time  . polyethylene glycol (MIRALAX / GLYCOLAX) packet Take 17 g by mouth 2 (two) times daily.   07/30/2015 at Unknown time  . pravastatin (PRAVACHOL) 20 MG tablet Take 20 mg by mouth daily at 6 PM.    07/30/2015 at Unknown time  . tuberculin (TUBERSOL) 5 UNIT/0.1ML injection Inject 5 Units into the skin once.   07/30/2015 at Unknown time  . warfarin (COUMADIN) 2 MG tablet Take 2 mg by mouth daily at 6 PM.   07/30/2015 at 2100  . warfarin (COUMADIN) 1 MG tablet 1.5 mg daily recheck PT/INW 1 week on 07/30/15 (Patient not taking: Reported on 07/31/2015)   Not Taking at Unknown time    Assessment: 81 yo F on  Coumadin PTA for hx afib, PE, DVT was admitted with AMS and urosepsis.  Pt currently NPO pending swallow evaluation.  Per MD, will initiation heparin if/when INR <2 while Coumadin on hold due to NPO status.   Goal of Therapy:  INR 2-3 Heparin level 0.3-0.7 units/ml   Plan:  No heparin for now. Daily INR. Follow-up swallow eval for plans to restart Coumadin D/C sq heparin for VTE prophylaxis.  Not needed with therapeutic INR.  [employer], Pharm.D., BCPS Clinical Pharmacist Pager [phone] 08/01/2015 3:20 PM

## 2015-08-01 NOTE — Progress Notes (Signed)
Bedside EEG completed, results pending. 

## 2015-08-01 NOTE — Evaluation (Signed)
Physical Therapy Evaluation Patient Details Name: Lindsay Sheppard MRN: [id number] DOB: December 15, 1920 Today's Date: 08/01/2015   History of Present Illness  81 y/o with multiple medical problems, recently discharged from the hospital due to acute toxic metabolic encephalopathy thought to be 2/2 PNA and UTI d/c to [facility]. Pt admitted 2/2 AMS and hallucinations w/ episodes fo UE stiffening and twitching. CT/MRI/MRA of brain shows no acute abnormality. PMH includes MI, chronic AF, recurrent DVT and PE, chronic diastolic heart failure, syncope.  Clinical Impression  Pt admitted with above diagnosis. Pt currently with functional limitations due to the deficits listed below (see PT Problem List). Lindsay Sheppard was very lethargic and did not open eyes until sitting initiated, requiring max A for supine<>sit.  Min A at times to maintain upright while sitting EOB.  Pt is from [facility] and per pt's daughter, Lindsay Sheppard, plan is to go SNF at d/c, unsure at this time if this will be [facility] or elsewhere, will need SW consult. Per pt's daughter, PTA pt ambulating w/ RW and A at [facility] ~50 ft. Pt will benefit from skilled PT to increase their independence and safety with mobility to allow discharge to the venue listed below.      Follow Up Recommendations SNF;Supervision/Assistance - 24 hour    Equipment Recommendations  Other (comment) (TBD at next venue of care)    Recommendations for Other Services       Precautions / Restrictions Precautions Precautions: Fall Restrictions Weight Bearing Restrictions: No      Mobility  Bed Mobility Overal bed mobility: Needs Assistance Bed Mobility: Supine to Sit;Sit to Supine     Supine to sit: Max assist Sit to supine: Max assist   General bed mobility comments: A to pull up to sitting and to scoot to EOB using bed pad.  Pt participating once awakens once sitting is initiated.  Transfers                 General transfer comment: did  not attempt for patient/therapist safety  Ambulation/Gait                Stairs            Wheelchair Mobility    Modified Rankin (Stroke Patients Only)       Balance Overall balance assessment: Needs assistance Sitting-balance support: Bilateral upper extremity supported;Feet supported Sitting balance-Leahy Scale: Poor Sitting balance - Comments: Requires min A at times to maintain upright as pt demonstrates posterior w/ difficulty maintaining Bil feet on floor for stability.  Pt sits EOB for ~ 10 mins w/ close min guard-min A Postural control: Posterior lean                                   Pertinent Vitals/Pain Pain Assessment: Faces Faces Pain Scale: Hurts even more Pain Location: Lt ant thigh while sitting EOB (suspect 2/2 activiation of hip flexors to prevent post lean) Pain Descriptors / Indicators: Grimacing;Moaning Pain Intervention(s): Limited activity within patient's tolerance;Monitored during session;Repositioned    Home Living Family/patient expects to be discharged to:: Skilled nursing facility                 Additional Comments: Pt is from [facility]  Daughter in room reports that assist she has available is from her children who are elderly and are unable to assist physically.    Prior Function Level of Independence:  Needs assistance   Gait / Transfers Assistance Needed: A at [facility] to ambulate ~50 ft using RW  ADL's / Homemaking Assistance Needed: A for all ADLs at [place]: Right    Extremity/Trunk Assessment   Upper Extremity Assessment: Generalized weakness           Lower Extremity Assessment: Generalized weakness RLE Deficits / Details: able to perform Rt LAQ, limited by tight hamstrings L>R.  Able to initiate bringing Bil LEs back into bed. LLE Deficits / Details: able to perform Rt LAQ, limited by tight hamstrings L>R.  Able to initiate bringing Bil  LEs back into bed.     Communication   Communication: HOH (deaf in [place] ear per pt's daugther)  Cognition Arousal/Alertness: Lethargic Behavior During Therapy: Flat affect Overall Cognitive Status: Impaired/Different from baseline Area of Impairment: Orientation;Attention;Memory;Following commands;Safety/judgement;Awareness;Problem solving Orientation Level: Disoriented to;Time;Situation Current Attention Level: Focused Memory: Decreased short-term memory Following Commands: Follows one step commands consistently Safety/Judgement: Decreased awareness of safety;Decreased awareness of deficits Awareness: Intellectual Problem Solving: Slow processing;Decreased initiation;Difficulty sequencing;Requires verbal cues;Requires tactile cues General Comments: Pt very lethargic and difficult to arouse inititally.  However, opens her eyes and is able to answer/follow simple questions/commands once sitting is initiated by therapist.      General Comments General comments (skin integrity, edema, etc.): VSS.  Daughter in room during evaluation and discussed d/c plan w/ her and her brother in law at end of session.    Exercises General Exercises - Lower Extremity Ankle Circles/Pumps: PROM;Both;10 reps;Supine Long Arc Quad: AROM;Right;10 reps;Seated;Left;Limitations Long [employer] Limitations: Pt unable to perform Lt LAQ 2/2 hamstring tightness, pt's daughter reports this is pt's baseline      Assessment/Plan    PT Assessment Patient needs continued PT services  PT Diagnosis Difficulty walking;Generalized weakness;Altered mental status   PT Problem List Decreased strength;Decreased range of motion;Decreased activity tolerance;Decreased balance;Decreased mobility;Decreased cognition;Decreased knowledge of use of DME;Decreased safety awareness;Decreased knowledge of precautions;Pain  PT Treatment Interventions DME instruction;Gait training;Functional mobility training;Therapeutic activities;Therapeutic  exercise;Balance training;Neuromuscular re-education;Cognitive remediation;Patient/family education;Wheelchair mobility training   PT Goals (Current goals can be found in the Care Plan section) Acute Rehab PT Goals Patient Stated Goal: Daughter states snf PT Goal Formulation: With patient/family Time For Goal Achievement: 08/22/15 Potential to Achieve Goals: Fair    Frequency Min 2X/week   Barriers to discharge   Lindsay Sheppard, pt daughter, reports herself and her siblings are unable to provide appropriate assist for pt as they are all physically unable.  Lindsay Sheppard is open to SNF at d/c for rehab.    Co-evaluation               End of Session   Activity Tolerance: Patient limited by lethargy;Patient limited by fatigue Patient left: in bed;with call bell/phone within reach;with bed alarm set;with family/visitor present Nurse Communication: Mobility status;Need for lift equipment;Precautions         Time: [id number] PT Time Calculation (min) (ACUTE ONLY): 20 min   Charges:   PT Evaluation $Initial PT Evaluation Tier I: 1 Procedure     PT G Codes:       Joslyn Hy PT, DPT [phone] Pager: [phone] 08/01/2015, 12:26 PM

## 2015-08-02 DIAGNOSIS — I82409 Acute embolism and thrombosis of unspecified deep veins of unspecified lower extremity: Secondary | ICD-10-CM

## 2015-08-02 DIAGNOSIS — N390 Urinary tract infection, site not specified: Secondary | ICD-10-CM | POA: Diagnosis present

## 2015-08-02 DIAGNOSIS — J690 Pneumonitis due to inhalation of food and vomit: Secondary | ICD-10-CM | POA: Diagnosis present

## 2015-08-02 DIAGNOSIS — I2699 Other pulmonary embolism without acute cor pulmonale: Secondary | ICD-10-CM | POA: Diagnosis present

## 2015-08-02 DIAGNOSIS — I2782 Chronic pulmonary embolism: Secondary | ICD-10-CM

## 2015-08-02 DIAGNOSIS — E1140 Type 2 diabetes mellitus with diabetic neuropathy, unspecified: Secondary | ICD-10-CM | POA: Diagnosis present

## 2015-08-02 DIAGNOSIS — I482 Chronic atrial fibrillation: Secondary | ICD-10-CM

## 2015-08-02 DIAGNOSIS — J189 Pneumonia, unspecified organism: Secondary | ICD-10-CM

## 2015-08-02 LAB — COMPREHENSIVE METABOLIC PANEL
ALBUMIN: 2.4 g/dL — AB (ref 3.5–5.0)
ALK PHOS: 156 U/L — AB (ref 38–126)
ALT: 81 U/L — ABNORMAL HIGH (ref 14–54)
ANION GAP: 9 (ref 5–15)
AST: 99 U/L — ABNORMAL HIGH (ref 15–41)
BILIRUBIN TOTAL: 0.5 mg/dL (ref 0.3–1.2)
BUN: 25 mg/dL — ABNORMAL HIGH (ref 6–20)
CALCIUM: 9 mg/dL (ref 8.9–10.3)
CO2: 25 mmol/L (ref 22–32)
Chloride: 107 mmol/L (ref 101–111)
Creatinine, Ser: 1.14 mg/dL — ABNORMAL HIGH (ref 0.44–1.00)
GFR calc Af Amer: 47 mL/min — ABNORMAL LOW (ref >60)
GFR, EST NON AFRICAN AMERICAN: 40 mL/min — AB (ref >60)
GLUCOSE: 136 mg/dL — AB (ref 65–99)
POTASSIUM: 3.7 mmol/L (ref 3.5–5.1)
Sodium: 141 mmol/L (ref 135–145)
TOTAL PROTEIN: 5.2 g/dL — AB (ref 6.5–8.1)

## 2015-08-02 LAB — CBC
HEMATOCRIT: 32.7 % — AB (ref 36.0–46.0)
HEMOGLOBIN: 10.3 g/dL — AB (ref 12.0–15.0)
MCH: 27.6 pg (ref 26.0–34.0)
MCHC: 31.5 g/dL (ref 30.0–36.0)
MCV: 87.7 fL (ref 78.0–100.0)
Platelets: 185 10*3/uL (ref 150–400)
RBC: 3.73 MIL/uL — ABNORMAL LOW (ref 3.87–5.11)
RDW: 16.1 % — AB (ref 11.5–15.5)
WBC: 4.8 10*3/uL (ref 4.0–10.5)

## 2015-08-02 LAB — GLUCOSE, CAPILLARY
GLUCOSE-CAPILLARY: 123 mg/dL — AB (ref 65–99)
Glucose-Capillary: 170 mg/dL — ABNORMAL HIGH (ref 65–99)
Glucose-Capillary: 308 mg/dL — ABNORMAL HIGH (ref 65–99)
Glucose-Capillary: 267 mg/dL — ABNORMAL HIGH (ref 65–99)

## 2015-08-02 LAB — INFLUENZA PANEL BY PCR (TYPE A & B)
H1N1 flu by pcr: NOT DETECTED
INFLAPCR: NEGATIVE
Influenza B By PCR: NEGATIVE

## 2015-08-02 LAB — PROTIME-INR
INR: 1.99 — ABNORMAL HIGH (ref 0.00–1.49)
Prothrombin Time: 22.5 seconds — ABNORMAL HIGH (ref 11.6–15.2)

## 2015-08-02 LAB — URINE CULTURE

## 2015-08-02 LAB — HEPARIN LEVEL (UNFRACTIONATED)
HEPARIN UNFRACTIONATED: 0.35 [IU]/mL (ref 0.30–0.70)
HEPARIN UNFRACTIONATED: 0.46 [IU]/mL (ref 0.30–0.70)

## 2015-08-02 LAB — HEMOGLOBIN A1C
HEMOGLOBIN A1C: 7.2 % — AB (ref 4.8–5.6)
Mean Plasma Glucose: 160 mg/dL

## 2015-08-02 LAB — PROLACTIN: Prolactin: 12.7 ng/mL (ref 4.8–23.3)

## 2015-08-02 MED ORDER — HEPARIN (PORCINE) IN NACL 100-0.45 UNIT/ML-% IJ SOLN
950.0000 [IU]/h | INTRAMUSCULAR | Status: DC
  Administered 2015-08-02 – 2015-08-03 (×2): 950 [IU]/h via INTRAVENOUS
  Administered 2015-08-05: 900 [IU]/h via INTRAVENOUS
  Filled 2015-08-02 (×4): qty 250

## 2015-08-02 MED ORDER — LABETALOL HCL 300 MG PO TABS
300.0000 mg | ORAL_TABLET | Freq: Two times a day (BID) | ORAL | Status: DC
  Administered 2015-08-02 – 2015-08-07 (×10): 300 mg via ORAL
  Filled 2015-08-02 (×10): qty 1

## 2015-08-02 MED ORDER — SODIUM CHLORIDE 0.9 % IV SOLN
250.0000 mg | Freq: Three times a day (TID) | INTRAVENOUS | Status: DC
  Administered 2015-08-02 – 2015-08-05 (×8): 250 mg via INTRAVENOUS
  Filled 2015-08-02 (×11): qty 250

## 2015-08-02 MED ORDER — RESOURCE THICKENUP CLEAR PO POWD
ORAL | Status: DC | PRN
  Filled 2015-08-02: qty 125

## 2015-08-02 MED ORDER — DEXTROSE-NACL 5-0.45 % IV SOLN
INTRAVENOUS | Status: DC
  Administered 2015-08-02 – 2015-08-03 (×2): via INTRAVENOUS

## 2015-08-02 MED ORDER — METHYLPREDNISOLONE SODIUM SUCC 125 MG IJ SOLR
60.0000 mg | INTRAMUSCULAR | Status: DC
  Administered 2015-08-02 – 2015-08-03 (×2): 60 mg via INTRAVENOUS
  Filled 2015-08-02 (×2): qty 2

## 2015-08-02 MED ORDER — INSULIN ASPART 100 UNIT/ML ~~LOC~~ SOLN
0.0000 [IU] | SUBCUTANEOUS | Status: DC
  Administered 2015-08-02: 11 [IU] via SUBCUTANEOUS
  Administered 2015-08-03: 7 [IU] via SUBCUTANEOUS
  Administered 2015-08-03: 15 [IU] via SUBCUTANEOUS
  Administered 2015-08-03: 7 [IU] via SUBCUTANEOUS
  Administered 2015-08-03: 4 [IU] via SUBCUTANEOUS
  Administered 2015-08-03: 11 [IU] via SUBCUTANEOUS
  Administered 2015-08-04: 7 [IU] via SUBCUTANEOUS
  Administered 2015-08-04 (×4): 4 [IU] via SUBCUTANEOUS
  Administered 2015-08-05 – 2015-08-06 (×4): 3 [IU] via SUBCUTANEOUS

## 2015-08-02 MED ORDER — IPRATROPIUM-ALBUTEROL 0.5-2.5 (3) MG/3ML IN SOLN
3.0000 mL | Freq: Four times a day (QID) | RESPIRATORY_TRACT | Status: DC
  Administered 2015-08-02 – 2015-08-03 (×6): 3 mL via RESPIRATORY_TRACT
  Filled 2015-08-02 (×6): qty 3

## 2015-08-02 MED ORDER — HYDRALAZINE HCL 20 MG/ML IJ SOLN
10.0000 mg | Freq: Once | INTRAMUSCULAR | Status: AC
  Administered 2015-08-02: 10 mg via INTRAVENOUS
  Filled 2015-08-02: qty 1

## 2015-08-02 MED ORDER — DM-GUAIFENESIN ER 30-600 MG PO TB12
1.0000 | ORAL_TABLET | Freq: Two times a day (BID) | ORAL | Status: DC
  Administered 2015-08-02 – 2015-08-06 (×9): 1 via ORAL
  Filled 2015-08-02 (×9): qty 1

## 2015-08-02 NOTE — Clinical Documentation Improvement (Signed)
Internal Medicine  A cause and effect relationship may not be assumed and must be documented by a provider.  Please clarify the relationship, if any, between UTI and foley cath in progress notes and discharge summary.  Are the conditions:   Due to or associated with each other  Unrelated to each other  Other  Clinically Undetermined   Supporting Information (risk factors, sign and symptoms, diagnostics, treatment):  Admitted with Sepsis secondary to UTI  Has Chronic foley catheter  ED Provider note: Today she has an elevation in her white blood cell count, persistent pyuria concerning for potential source of infection as well as persistent sign of possible pneumonia which could represent sepsis in this elderly patient  H&P: Assessment/Plan 1. Sepsis secondary to UTI [facility]) The pt meets SIRS criteria Tachycardia heart rate ranging from 90 to 130 Tachypnea Mild Leucocytosis. With too numerous to count WBC in the urine suggesting possible UTI and presenting with acute encephalopathy. The pt has evidence of source of infection in urine She is on room air, Platelet count 169, Bilirubin 0.7, MAP 110, GCS 8, S.Creatinine 1.3 Suggesting SOFA score of 4. With this the patient will be admitted to the hospital. She'll be treated with IV Zosyn. We will monitor her in stepdown unit. We'll hydrate her with IV fluids. Follow the cultures  Component     Latest Ref Rng 07/31/2015  Color, Urine     YELLOW AMBER (A)  Appearance     CLEAR CLOUDY (A)  Specific Gravity, Urine     1.005 - 1.030 1.025  pH     5.0 - 8.0 5.5  Glucose     NEGATIVE mg/dL 500 (A)  Hgb urine dipstick     NEGATIVE MODERATE (A)  Bilirubin Urine     NEGATIVE SMALL (A)  Ketones, ur     NEGATIVE mg/dL NEGATIVE  Protein     NEGATIVE mg/dL 100 (A)  Nitrite     NEGATIVE NEGATIVE  Leukocytes, UA     NEGATIVE LARGE (A)   Component     Latest Ref Rng 07/31/2015  Squamous Epithelial / LPF     NONE SEEN 0-5  (A)  WBC, UA     0 - 5 WBC/hpf TOO NUMEROUS TO COUNT  RBC / HPF     0 - 5 RBC/hpf 6-30  Bacteria, UA     NONE SEEN MANY (A)  Urine-Other      YEAST PRESENT    Please exercise your independent, professional judgment when responding. A specific answer is not anticipated or expected.   Thank You,  [place] [phone]

## 2015-08-02 NOTE — Progress Notes (Signed)
[place] TEAM 1 - Stepdown/ICU TEAM Progress Note  Lindsay Sheppard [id number] DOB: [birth year] DOA: 07/31/2015 PCP: [name], MD  Admit HPI / Brief Narrative: 81 y.o. WF PMHx Depressive DO, DM Type 2 with neuropathy, HTN, Chronic Atrial Fibrillation, Chronic Diastolic CHF, Syncope, DVT/PE on chronic Coumadin, Compression Fx L-spine/T-spine, Renal Insufficiency, Lt Renal Cyst, Nephrolithiasis, recent urinary tract infection,  Presents to the emergency department for the second time this week with confusion and lethargy. On Sunday, 10/30 the patient was seen in the emergency department for UTI. She was prescribed Keflex and discharged home. She returns today lethargic and confused unable to give her an history. History was collected from her daughter at bedside. Per her daughter for the last 48-72 hours the patient has had nausea and dry heaving, she has complained of right lower quadrant abdominal pain. Normally the patient gets up with the assistance of her daughters in the morning but this morning she was too lethargic to do so. Her lethargy and confusion prompted the daughters to bring her back to the hospital. The patient has a chronic Foley catheter that was changed last on October 7 (prior to the discovery of her urinary tract infection). Reportedly the patient's bowel movements have been normal. She has had a lot of dry heaving but no emesis. She has been complaining congestion in the back of her throat.  In the emergency department her blood pressure is 225/87, labs are otherwise unrevealing, CT head is negative for acute stroke or bleed, chest x-ray shows left base opacity.  Patient is lethargic and hard of hearing and unable to give review of systems.  HPI/Subjective: 12/1 A/O 3 (did not know where), negative CP, negative SOB, negative N/V  Assessment/Plan:  Hypertensive emergency -BP 225/87 in the ER with confusion and lethargy -Will give when necessary hydralazine and resume  the patient's home blood pressure medications including: -Decrease home dose labetalol to 300 mg  BID -Clonidine, 0.3 mg transdermal -Lasix and other BP medication   Chronic atrial fibrillation -Will start patient on heparin drip. Goals of care need to be clarified would not send patient home on any anticoagulation  Community acquired pneumonia versus Aspiration pneumonia -Left base opacity seen on chest x-ray. Has a history of Aspiration. -Started on azithromycin and Rocephin in the emergency department will continue these. -Imipenem per pharmacy; will cover both aspiration pneumonia and her Enterobacter Aerogenes UTI  -Speech therapy; recommends NPO  -Mucinex DM BID  -Flutter valve -Physiotherapy vest BID -Solu-Medrol 60 mg daily -DuoNeb QID -Influenza panel pending -Strep pneumo urine antigen pending -Legionella urine antigen pending -Sputum culture pending  Confusion and lethargy -Most likely multifactorial to include HTN, pneumonia, UTI, dementia  CT head is negative. Neuro exam is nonfocal. We will treat underlying problems.  Right lower quadrant pain -Uncertain etiology.  -Patient has a 5.8x4.6 cm mass in her right colon found on CT scan in 09/2014. Family has opted not to work this up.  -We will check two-view abdominal x-ray for possible constipation or other sources of pain. -Palliative care consult placed  Urinary tract infection positive ENTEROBACTER AEROGENES -Resistant to Zosyn.  -Start Imipenem per pharmacy  -Change Foley catheter. This catheter was last changed on 10/7.  Diabetes mellitus with neuropathy -Controlled.  -Increase to resistant SSI  History of DVT and PE -Continue Coumadin per pharmacy. INR is therapeutic.  Goals of care -81 year old with multiple medical problems clarify short-term versus long-term goals of care -NOTE patient most likely has malignancy in her:  Along with other system failures.    Code Status: DO NOT RESUSCITATE Family  Communication: no family present at time of exam Disposition Plan: Await palliative care recommendations    Consultants:   Procedure/Significant Events: 11/29 MRI brain;I No acute infarct or acute intracranial abnormality -Chronic distal left vertebral artery occlusion.     Culture 11/29 urine positive ENTEROBACTER AEROGENES 12 /1 Influenza panel pending 12/1Strep pneumo urine antigen pending 12/1Legionella urine antigen pending 12/1Sputum culture pending   Antibiotics: Zosyn 11/29>> 12/1 Imipenem 12/1>>   DVT prophylaxis: Heparin infusion   Devices    LINES / TUBES:      Continuous Infusions: . dextrose 5 % and 0.45% NaCl 75 mL/hr at 08/02/15 0037  . heparin 950 Units/hr (08/02/15 0925)    Objective: VITAL SIGNS: Temp: 97.8 F (36.6 C) (12/01 1500) Temp Source: Oral (12/01 1500) BP: 112/99 mmHg (12/01 1400) Pulse Rate: 81 (12/01 1400) SPO2; FIO2:   Intake/Output Summary (Last 24 hours) at 08/02/15 2000 Last data filed at 08/02/15 1500  Gross per 24 hour  Intake    525 ml  Output    990 ml  Net   -465 ml     Exam: General:  A/O 3 (did not know where), sleepy but arousable No acute respiratory distress Eyes: Negative headache, eye pain, double vision,negative scleral hemorrhage ENT: Negative Runny nose, negative ear pain, negative gingival bleeding, Neck:  Negative scars, masses, torticollis, lymphadenopathy, JVD Lungs: Clear to auscultation bilaterally without wheezes or crackles Cardiovascular: Irregular irregular rhythm and rate, without murmur gallop or rub normal S1 and S2 Abdomen:negative abdominal pain, nondistended, positive soft, bowel sounds, no rebound, no ascites, no appreciable mass Extremities: No significant cyanosis, clubbing, or edema bilateral lower extremities Psychiatric:  Negative depression, negative anxiety, negative fatigue, negative mania  Neurologic:  Cranial nerves II through XII intact, tongue/uvula midline, all  extremities muscle strength 5/5, sensation intact throughout,  negative dysarthria, negative expressive aphasia, negative receptive aphasia.   Data Reviewed: Basic Metabolic Panel:  Recent Labs Lab 07/31/15 1248 08/01/15 0507 08/02/15 0600  NA 136 139 141  K 4.4 3.9 3.7  CL 103 105 107  CO2 25 27 25   GLUCOSE 307* 83 136*  BUN 27* 22* 25*  CREATININE 1.35* 1.20* 1.14*  CALCIUM 9.6 9.4 9.0   Liver Function Tests:  Recent Labs Lab 07/31/15 1248 08/02/15 0600  AST 22 99*  ALT 29 81*  ALKPHOS 74 156*  BILITOT 0.7 0.5  PROT 5.9* 5.2*  ALBUMIN 3.3* 2.4*   No results for input(s): LIPASE, AMYLASE in the last 168 hours.  Recent Labs Lab 07/31/15 1537  AMMONIA 11   CBC:  Recent Labs Lab 07/31/15 1248 08/01/15 0507 08/02/15 0600  WBC 10.6* 14.3* 4.8  NEUTROABS 8.9*  --   --   HGB 11.1* 11.5* 10.3*  HCT 35.6* 35.8* 32.7*  MCV 88.3 86.9 87.7  PLT 169 200 185   Cardiac Enzymes: No results for input(s): CKTOTAL, CKMB, CKMBINDEX, TROPONINI in the last 168 hours. BNP (last 3 results) No results for input(s): BNP in the last 8760 hours.  ProBNP (last 3 results) No results for input(s): PROBNP in the last 8760 hours.  CBG:  Recent Labs Lab 08/01/15 2309 08/01/15 2345 08/02/15 0501 08/02/15 1135 08/02/15 1626  GLUCAP 64* 251* 123* 170* 308*    Recent Results (from the past 240 hour(s))  Urine culture     Status: None   Collection Time: 07/31/15  3:15 PM  Result Value Ref Range Status  Specimen Description URINE, RANDOM  Final   Special Requests NONE  Final   Culture >=100,000 COLONIES/mL ENTEROBACTER AEROGENES  Final   Report Status 08/02/2015 FINAL  Final   Organism ID, Bacteria ENTEROBACTER AEROGENES  Final      Susceptibility   Enterobacter aerogenes - MIC*    CEFAZOLIN >=64 RESISTANT Resistant     CEFTRIAXONE 16 INTERMEDIATE Intermediate     CIPROFLOXACIN <=0.25 SENSITIVE Sensitive     GENTAMICIN <=1 SENSITIVE Sensitive     IMIPENEM 1  SENSITIVE Sensitive     NITROFURANTOIN <=16 SENSITIVE Sensitive     TRIMETH/SULFA <=20 SENSITIVE Sensitive     PIP/TAZO >=128 RESISTANT Resistant     * >=100,000 COLONIES/mL ENTEROBACTER AEROGENES  Blood culture (routine x 2)     Status: None (Preliminary result)   Collection Time: 07/31/15  3:50 PM  Result Value Ref Range Status   Specimen Description BLOOD RIGHT ARM  Final   Special Requests BOTTLES DRAWN AEROBIC AND ANAEROBIC 5CC  Final   Culture NO GROWTH 2 DAYS  Final   Report Status PENDING  Incomplete  Blood culture (routine x 2)     Status: None (Preliminary result)   Collection Time: 07/31/15  3:55 PM  Result Value Ref Range Status   Specimen Description BLOOD RIGHT HAND  Final   Special Requests BOTTLES DRAWN AEROBIC ONLY [place]  Final   Culture NO GROWTH 2 DAYS  Final   Report Status PENDING  Incomplete  MRSA PCR Screening     Status: None   Collection Time: 07/31/15  8:52 PM  Result Value Ref Range Status   MRSA by PCR NEGATIVE NEGATIVE Final    Comment:        The GeneXpert MRSA Assay (FDA approved for NASAL specimens only), is one component of a comprehensive MRSA colonization surveillance program. It is not intended to diagnose MRSA infection nor to guide or monitor treatment for MRSA infections.      Studies:  Recent x-ray studies have been reviewed in detail by the Attending Physician  Scheduled Meds:  Scheduled Meds: .  stroke: mapping our early stages of recovery book   Does not apply Once  . benztropine  0.5 mg Oral Daily  . cloNIDine  0.3 mg Transdermal Weekly  . dextromethorphan-guaiFENesin  1 tablet Oral BID  . imipenem-cilastatin  250 mg Intravenous 3 times per day  . insulin aspart  0-20 Units Subcutaneous 6 times per day  . ipratropium-albuterol  3 mL Nebulization Q6H  . labetalol  300 mg Oral BID  . methylPREDNISolone (SOLU-MEDROL) injection  60 mg Intravenous Q24H  . pantoprazole  40 mg Oral Daily  . polyethylene glycol  17 g Oral BID  .  pravastatin  20 mg Oral q1800    Time spent on care of this patient: 40 mins   WOODS, Geraldo Docker , MD  [facility] Office  [phone] Pager - [phone]  On-Call/Text Page:      [email or web address]      password [email or web address]  If 7PM-7AM, please contact night-coverage [email or web address] Password [email or web address] 08/02/2015, 8:00 PM   LOS: 2 days   Care during the described time interval was provided by me .  I have reviewed this patient's available data, including medical history, events of note, physical examination, and all test results as part of my evaluation. I have personally reviewed and interpreted all radiology studies.   Dia Crawford, MD [phone] Pager

## 2015-08-02 NOTE — Progress Notes (Addendum)
Speech Language Pathology Treatment: Dysphagia  Patient Details Name: Lindsay Sheppard MRN: [id number] DOB: December 22, 1920 Today's Date: 08/02/2015 Time: 1010-1033 SLP Time Calculation (min) (ACUTE ONLY): 23 min  Assessment / Plan / Recommendation Clinical Impression  Pt demonstrates ability to sustain arousal and attention PO intake throughout session. With max assist fading to independence the pt fed herself puree with no signs of aspiration. Coughing indicative of aspiration observed with thin liquids with delayed and discoordinated swallow attempts. Timing and management improved with nectar thick liquids. Second swallow likely necessary to fully clear bolus. Will initiate puree/nectar thick liquids given improvement. Expect eventual upgrade with possible MBS if needed as pt continues to improve to prior baseline function. Will defer SLE as pts mentation is improving and CT is negative.    HPI HPI: Lindsay Sheppard is a 81 y.o. 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 The patient presented with complains of worsening of confusion starting 07/30/15, suspect sepsis due to UTI. Pt has a chronic foley catheter. Pt was recently hospitalized for acute encephalopathy which whas thought to be secondary to pneumonia and UTI. MBS 07/09/15 recommended dysphagia 2, nectar-thick liquids due to decreased laryngeal closure with silent penetration of nectar x1 and silent aspiration with thin, pt was discharged on dysphagia diet. Current CXR, showed left lower lobe atelectasis and/or pneumonia, not significantly changed since 07/06/2015. Current MRI showed no acute finding.       SLP Plan  Continue with current plan of care     Recommendations  Diet recommendations: Dysphagia 1 (puree);Nectar-thick liquid Liquids provided via: Cup;Straw Medication  Administration: Whole meds with puree Supervision: Staff to assist with self feeding;Trained caregiver to feed patient;Full supervision/cueing for compensatory strategies Compensations: Slow rate;Small sips/bites;Multiple dry swallows after each bite/sip Postural Changes and/or Swallow Maneuvers: Seated upright 90 degrees              Oral Care Recommendations: Oral care BID Follow up Recommendations: Skilled Nursing facility Plan: Continue with current plan of care   Lindsay Sheppard, Lindsay Sheppard 08/02/2015, 11:42 AM

## 2015-08-02 NOTE — Progress Notes (Signed)
ANTICOAGULATION CONSULT NOTE - Follow Up Consult  Pharmacy Consult for heparin Indication: hx of afib, PE, and DVT  Allergies  Allergen Reactions  . Amiodarone Other (See Comments)    REACTION: f? fluid retention  . Deltasone [Prednisone] Other (See Comments)    unknown  . Diltiazem Hcl Other (See Comments)    unknown  . Levofloxacin Nausea And Vomiting    REACTION: nausea  . Pregabalin Other (See Comments)    REACTION: hallucinations  . Spironolactone Other (See Comments)    REACTION: elevated K at lowest dose  . Tequin [Gatifloxacin] Other (See Comments)    Hypoglycemia  . Diazepam Anxiety and Other (See Comments)    REACTION: agitation    Patient Measurements: Height: 5\' 5"  (165.1 cm) Weight: 140 lb (63.504 kg) IBW/kg (Calculated) : 57 Heparin Dosing Weight: 63 kg  Vital Signs: Temp: 98.7 F (37.1 C) (12/01 1200) Temp Source: Axillary (12/01 1200)  Labs:  Recent Labs  07/31/15 1248  07/31/15 1536 07/31/15 1719 08/01/15 0507 08/02/15 0600 08/02/15 1634  HGB 11.1*  --   --   --  11.5* 10.3*  --   HCT 35.6*  --   --   --  35.8* 32.7*  --   PLT 169  --   --   --  200 185  --   APTT  --   --  48* 46*  --   --   --   LABPROT  --   < > 20.8* 22.0* 23.6* 22.5*  --   INR  --   < > 1.80* 1.94* 2.12* 1.99*  --   HEPARINUNFRC  --   --   --   --   --   --  0.35  CREATININE 1.35*  --   --   --  1.20* 1.14*  --   < > = values in this interval not displayed.  Estimated Creatinine Clearance: 28.3 mL/min (by C-G formula based on Cr of 1.14).   Medications:  Scheduled:  .  stroke: mapping our early stages of recovery book   Does not apply Once  . benztropine  0.5 mg Oral Daily  . cloNIDine  0.3 mg Transdermal Weekly  . dextromethorphan-guaiFENesin  1 tablet Oral BID  . imipenem-cilastatin  250 mg Intravenous 3 times per day  . insulin aspart  0-15 Units Subcutaneous Q6H  . ipratropium-albuterol  3 mL Nebulization Q6H  . labetalol  400 mg Oral BID  .  methylPREDNISolone (SOLU-MEDROL) injection  60 mg Intravenous Q24H  . pantoprazole  40 mg Oral Daily  . polyethylene glycol  17 g Oral BID  . pravastatin  20 mg Oral q1800   Infusions:  . dextrose 5 % and 0.45% NaCl 75 mL/hr at 08/02/15 0037  . heparin 950 Units/hr (08/02/15 0925)    Assessment: 81 yo female with afib and DVT/PE is currently on therapeutic heparin.  Heparin level is 0.35.   Goal of Therapy:  Heparin level 0.3-0.7 units/ml Monitor platelets by anticoagulation protocol: Yes   Plan:  - continue heparin at 950 units/hr - 8hr level to confirm  Ean Gettel, Tsz-Yin 08/02/2015,5:01 PM

## 2015-08-02 NOTE — Evaluation (Signed)
Occupational Therapy Evaluation Patient Details Name: Lindsay Sheppard MRN: [id number] DOB: 05-Jul-1921 Today's Date: 08/02/2015    History of Present Illness 81 y/o with multiple medical problems, recently discharged from the hospital due to acute toxic metabolic encephalopathy thought to be 2/2 PNA and UTI d/c to [facility]. Pt admitted 2/2 AMS and hallucinations w/ episodes fo UE stiffening and twitching. CT/MRI/MRA of brain shows no acute abnormality. PMH includes MI, chronic AF, recurrent DVT and PE, chronic diastolic heart failure, syncope.   Clinical Impression   Pt was dependent in bathing, dressing and toileting with waxing and waning of ability to ambulate while at [facility]. Pt could self feed.  She presents with lethargy, but easily aroused.  She requires max assist for bed mobility and total care.  Plan is for return to SNF upon medical stabilization. Will defer further OT to SNF.   Follow Up Recommendations  SNF;Supervision/Assistance - 24 hour    Equipment Recommendations       Recommendations for Other Services       Precautions / Restrictions Precautions Precautions: Fall Restrictions Weight Bearing Restrictions: No      Mobility Bed Mobility Overal bed mobility: Needs Assistance Bed Mobility: Rolling Rolling: Max assist            Transfers                 General transfer comment: did not attempt for patient/therapist safety    Balance                                            ADL                                         General ADL Comments: Pt requiring total care today.  Assisted to eat, to place on bed pan and perform pericare.     Vision     Perception     Praxis      Pertinent Vitals/Pain Pain Assessment: Faces Faces Pain Scale: No hurt     Hand Dominance Right   Extremity/Trunk Assessment Upper Extremity Assessment Upper Extremity Assessment: Generalized weakness (3-/5  shoulders, 3+/5 elbow to hands)   Lower Extremity Assessment Lower Extremity Assessment: Defer to PT evaluation       Communication Communication Communication: HOH   Cognition Arousal/Alertness: Lethargic Behavior During Therapy: Flat affect Overall Cognitive Status: Impaired/Different from baseline Area of Impairment: Orientation;Attention;Memory;Following commands;Safety/judgement;Awareness;Problem solving Orientation Level: Disoriented to;Time;Situation;Place (chose place from choices) Current Attention Level: Sustained Memory: Decreased short-term memory Following Commands: Follows one step commands with increased time   Awareness: Intellectual Problem Solving: Slow processing;Decreased initiation;Difficulty sequencing;Requires verbal cues;Requires tactile cues General Comments: Easily aroused and remains awake while stimulated, at times closing her eyes   General Comments       Exercises       Shoulder Instructions      Home Living Family/patient expects to be discharged to:: Skilled nursing facility                                 Additional Comments: Pt is from [facility].  Daughter in room reports that assist she has available is from her children who are elderly  and are unable to assist physically.      Prior Functioning/Environment Level of Independence: Needs assistance  Gait / Transfers Assistance Needed: A at [facility] to ambulate ~50 ft using RW ADL's / Homemaking Assistance Needed: pt was able to self feed and perform some grooming, otherwise dependent  Communication / Swallowing Assistance Needed: HOH      OT Diagnosis: Generalized weakness;Cognitive deficits   OT Problem List: Decreased strength;Decreased activity tolerance;Impaired balance (sitting and/or standing);Decreased coordination;Impaired UE functional use;Decreased knowledge of use of DME or AE   OT Treatment/Interventions:      OT Goals(Current goals can be found in  the care plan section)    OT Frequency:     Barriers to D/C:            Co-evaluation              End of Session    Activity Tolerance: Patient limited by fatigue Patient left: in bed;with call bell/phone within reach;with bed alarm set;with nursing/sitter in room   Time: 1355-1430 OT Time Calculation (min): 35 min Charges:  OT General Charges $OT Visit: 1 Procedure OT Evaluation $Initial OT Evaluation Tier I: 1 Procedure OT Treatments $Self Care/Home Management : 8-22 mins G-Codes:    Malka So 08/02/2015, 2:58 PM  [phone]

## 2015-08-02 NOTE — Clinical Documentation Improvement (Signed)
Internal Medicine  Please clarify renal status in progress notes and discharge summary   Acute renal failure  Acute on Chronic renal failure  CKD Stage I - GFR greater than or equal to 90  CKD Stage II - GFR 60-89  CKD Stage III - GFR 30-59  CKD Stage IV - GFR 15-29  CKD Stage V - GFR < 15  ESRD (End Stage Renal Disease)  Other condition  Unable to clinically determine   Supporting Information:  81 yo female with history of renal insufficiency  Component     Latest Ref Rng 07/31/2015 08/01/2015 08/02/2015            BUN     6 - 20 mg/dL 27 (H) 22 (H) 25 (H)  Creatinine     0.44 - 1.00 mg/dL 1.35 (H) 1.20 (H) 1.14 (H)       EGFR (Non-African Amer.)     >60 mL/min 33 (L) 38 (L) 40 (L)   Treatment: Monitoring renal function Monitoring I&O  Please exercise your independent, professional judgment when responding. A specific answer is not anticipated or expected.   Thank You, [place] [phone]

## 2015-08-02 NOTE — Progress Notes (Signed)
ANTIBIOTIC CONSULT NOTE - INITIAL  Pharmacy Consult for Imipenem  Indication: UTI and aspiration PNA  Allergies  Allergen Reactions  . Amiodarone Other (See Comments)    REACTION: f? fluid retention  . Deltasone [Prednisone] Other (See Comments)    unknown  . Diltiazem Hcl Other (See Comments)    unknown  . Levofloxacin Nausea And Vomiting    REACTION: nausea  . Pregabalin Other (See Comments)    REACTION: hallucinations  . Spironolactone Other (See Comments)    REACTION: elevated K at lowest dose  . Tequin [Gatifloxacin] Other (See Comments)    Hypoglycemia  . Diazepam Anxiety and Other (See Comments)    REACTION: agitation    Patient Measurements: Height: 5\' 5"  (165.1 cm) Weight: 140 lb (63.504 kg) IBW/kg (Calculated) : 57  Vital Signs: Temp: 98.4 F (36.9 C) (12/01 0700) Temp Source: Oral (12/01 0700) BP: 185/94 mmHg (12/01 0423) Pulse Rate: 31 (12/01 0345) Intake/Output from previous day: 11/30 0701 - 12/01 0700 In: 575 [I.V.:525; IV Piggyback:50] Out: 600 [Urine:600] Intake/Output from this shift:    Labs:  Recent Labs  07/31/15 1248 08/01/15 0507 08/02/15 0600  WBC 10.6* 14.3* 4.8  HGB 11.1* 11.5* 10.3*  PLT 169 200 185  CREATININE 1.35* 1.20* 1.14*   Estimated Creatinine Clearance: 28.3 mL/min (by C-G formula based on Cr of 1.14). No results for input(s): VANCOTROUGH, VANCOPEAK, VANCORANDOM, GENTTROUGH, GENTPEAK, GENTRANDOM, TOBRATROUGH, TOBRAPEAK, TOBRARND, AMIKACINPEAK, AMIKACINTROU, AMIKACIN in the last 72 hours.   Microbiology: Recent Results (from the past 720 hour(s))  Culture, Urine     Status: None   Collection Time: 07/06/15  1:00 PM  Result Value Ref Range Status   Specimen Description URINE, RANDOM  Final   Special Requests NONE  Final   Culture MULTIPLE SPECIES PRESENT, SUGGEST RECOLLECTION  Final   Report Status 07/08/2015 FINAL  Final  Blood culture (routine x 2)     Status: None   Collection Time: 07/06/15  2:00 PM  Result  Value Ref Range Status   Specimen Description BLOOD LEFT ARM  Final   Special Requests BOTTLES DRAWN AEROBIC AND ANAEROBIC 5CC  Final   Culture NO GROWTH 5 DAYS  Final   Report Status 07/11/2015 FINAL  Final  Blood culture (routine x 2)     Status: None   Collection Time: 07/06/15  2:05 PM  Result Value Ref Range Status   Specimen Description BLOOD RIGHT HAND  Final   Special Requests BOTTLES DRAWN AEROBIC AND ANAEROBIC 5CC  Final   Culture NO GROWTH 5 DAYS  Final   Report Status 07/11/2015 FINAL  Final  Urine culture     Status: None   Collection Time: 07/31/15  3:15 PM  Result Value Ref Range Status   Specimen Description URINE, RANDOM  Final   Special Requests NONE  Final   Culture >=100,000 COLONIES/mL ENTEROBACTER AEROGENES  Final   Report Status 08/02/2015 FINAL  Final   Organism ID, Bacteria ENTEROBACTER AEROGENES  Final      Susceptibility   Enterobacter aerogenes - MIC*    CEFAZOLIN >=64 RESISTANT Resistant     CEFTRIAXONE 16 INTERMEDIATE Intermediate     CIPROFLOXACIN <=0.25 SENSITIVE Sensitive     GENTAMICIN <=1 SENSITIVE Sensitive     IMIPENEM 1 SENSITIVE Sensitive     NITROFURANTOIN <=16 SENSITIVE Sensitive     TRIMETH/SULFA <=20 SENSITIVE Sensitive     PIP/TAZO >=128 RESISTANT Resistant     * >=100,000 COLONIES/mL ENTEROBACTER AEROGENES  Blood culture (routine x  2)     Status: None (Preliminary result)   Collection Time: 07/31/15  3:50 PM  Result Value Ref Range Status   Specimen Description BLOOD RIGHT ARM  Final   Special Requests BOTTLES DRAWN AEROBIC AND ANAEROBIC 5CC  Final   Culture NO GROWTH < 24 HOURS  Final   Report Status PENDING  Incomplete  Blood culture (routine x 2)     Status: None (Preliminary result)   Collection Time: 07/31/15  3:55 PM  Result Value Ref Range Status   Specimen Description BLOOD RIGHT HAND  Final   Special Requests BOTTLES DRAWN AEROBIC ONLY [place]  Final   Culture NO GROWTH < 24 HOURS  Final   Report Status PENDING  Incomplete   MRSA PCR Screening     Status: None   Collection Time: 07/31/15  8:52 PM  Result Value Ref Range Status   MRSA by PCR NEGATIVE NEGATIVE Final    Comment:        The GeneXpert MRSA Assay (FDA approved for NASAL specimens only), is one component of a comprehensive MRSA colonization surveillance program. It is not intended to diagnose MRSA infection nor to guide or monitor treatment for MRSA infections.     Medical History: Past Medical History  Diagnosis Date  . POSTHERPETIC NEURALGIA 11/02/2009  . B12 DEFICIENCY 05/23/2007  . HYPERLIPIDEMIA 05/23/2007  . HYPERKALEMIA 05/30/2010  . ANEMIA-IRON DEFICIENCY 01/26/2007  . BLEPHARITIS, BILATERAL 02/23/2008  . Other specified forms of hearing loss 05/30/2010  . HYPERTENSION 01/26/2007  . Atrial fibrillation ([place]) 05/23/2007  . DIASTOLIC HEART FAILURE, CHRONIC 12/14/2009  . POSTURAL HYPOTENSION 02/07/2008  . ALLERGIC RHINITIS 11/02/2009  . GERD 01/26/2007  . CONSTIPATION 01/25/2008  . SKIN LESION 05/30/2010  . BACK PAIN 08/02/2007  . OSTEOPOROSIS 05/23/2007  . SYNCOPE 02/07/2008  . FATIGUE 09/06/2007  . Dysuria 09/05/2008  . Abdominal pain, epigastric 09/06/2007  . EPIGASTRIC TENDERNESS 10/12/2007  . PULMONARY EMBOLISM, HX OF 05/23/2007  . SHINGLES, HX OF 05/23/2007  . 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 Left foot pain   . Diabetic neuropathy ([place])   . RENAL INSUFFICIENCY 07/02/2009  . RENAL CYST, LEFT 09/06/2007  . Kidney stones     "never had OR"  . Family history of anesthesia complication     "daughter has PONV; another daughter a little anesthesia lasts way too long"  . CHF (congestive heart failure) ([place]) 2011  . Myocardial infarction [facility]) 2011    "mild"  . DVT (deep venous thrombosis) ([place]) ~ 1964    "think it was in the left"  . Aspiration pneumonia ([place]) ~ 2006    "due to aspiration"  . DIABETES MELLITUS, TYPE II 09/06/2007  . DEPRESSIVE DISORDER 01/25/2008    "@ times; never treated for it"  . Melanoma of nose ([place])   . Compression  fracture of lumbar vertebra ([facility])     hx  . Compression fracture of thoracic vertebra ([place]) 02/07/2014    "fell this am" (02/07/2014  . Arthritis     "hands" (03/15/2014)  . Foley catheter in place on admission     Assessment: 81yo female with history of Afib on warfarin presents with AMS. Now day #3 of abx for urosepsis. Had been on Zosyn. CXR shows Left base opacity and pt has history of aspiration. Blood cx pending. Urine cx shows enterobacter sensitive to imipenem but resistant to zosyn. Pharmacy consulted to start imipenem to cover enterobacter UTI and possible aspiration PNA. SCr 1.14, CrCl ~38ml/min.  Afebrile, WBC  wnl.  Goal of Therapy:  Resolution of infection  Plan:  Start imipenem 250mg  IV Q8 Monitor clinical picture, renal function F/U C&S, abx deescalation / LOT

## 2015-08-02 NOTE — Care Management Note (Addendum)
Case Management Note  Patient Details  Name: SHANETTA GALANT MRN: [id number] Date of Birth: 30-Sep-1920  Subjective/Objective:                Admitted with sepsis/UTI, from SNF.    Action/Plan:  PT/OT evaluations pending...Marland KitchenMarland Kitchenreturn to snf vs home with hh when medically stable. CM to f/u with disposition needs.  Expected Discharge Date:                  Expected Discharge Plan:  [place] (Pt from [facility])  In-House Referral:  Clinical Social Work  Discharge planning Services  CM Consult  Post Acute Care Choice:    Choice offered to:   daughter  DME Arranged:    DME Agency:     HH Arranged:    [place] Agency:   [facility]  Status of Service:  In process, will continue to follow  Medicare Important Message Given:    Date Medicare IM Given:    Medicare IM give by:    Date Additional Medicare IM Given:    Additional Medicare Important Message give by:     If discussed at [place] of Stay Meetings, dates discussed:    Additional Comments:   Gibson Ramp (Daughter)[phone], [facility] May (Daughter)  [phone]  Whitman Hero [place], [place] [phone] 08/02/2015, 11:07 AM

## 2015-08-02 NOTE — Progress Notes (Signed)
ANTICOAGULATION CONSULT NOTE - Follow Up Consult  Pharmacy Consult for Heparin when INR <2 Indication: hx atrial fibrillation, pulmonary embolus and DVT  Allergies  Allergen Reactions  . Amiodarone Other (See Comments)    REACTION: f? fluid retention  . Deltasone [Prednisone] Other (See Comments)    unknown  . Diltiazem Hcl Other (See Comments)    unknown  . Levofloxacin Nausea And Vomiting    REACTION: nausea  . Pregabalin Other (See Comments)    REACTION: hallucinations  . Spironolactone Other (See Comments)    REACTION: elevated K at lowest dose  . Tequin [Gatifloxacin] Other (See Comments)    Hypoglycemia  . Diazepam Anxiety and Other (See Comments)    REACTION: agitation    Patient Measurements: Height: 5\' 5"  (165.1 cm) Weight: 140 lb (63.504 kg) IBW/kg (Calculated) : 57 Heparin Dosing Weight: 63 kg  Vital Signs: Temp: 97.9 F (36.6 C) (12/01 0339) Temp Source: Axillary (12/01 0339) BP: 185/94 mmHg (12/01 0423) Pulse Rate: 31 (12/01 0345)  Labs:  Recent Labs  07/31/15 1248  07/31/15 1536 07/31/15 1719 08/01/15 0507 08/02/15 0600  HGB 11.1*  --   --   --  11.5* 10.3*  HCT 35.6*  --   --   --  35.8* 32.7*  PLT 169  --   --   --  200 185  APTT  --   --  48* 46*  --   --   LABPROT  --   < > 20.8* 22.0* 23.6* 22.5*  INR  --   < > 1.80* 1.94* 2.12* 1.99*  CREATININE 1.35*  --   --   --  1.20* 1.14*  < > = values in this interval not displayed.  Estimated Creatinine Clearance: 28.3 mL/min (by C-G formula based on Cr of 1.14).   Medications:  Prescriptions prior to admission  Medication Sig Dispense Refill Last Dose  . acetaminophen (TYLENOL) 325 MG tablet Take 2 tablets (650 mg total) by mouth 3 (three) times daily.   07/31/2015 at 0600  . AMBULATORY NON FORMULARY MEDICATION Medication Name: Med pass 120 cc by mouth two times daily in between meals   07/30/2015 at Unknown time  . benztropine (COGENTIN) 0.5 MG tablet Take 0.5 mg by mouth daily. For  involuntary jerks   07/30/2015 at Unknown time  . cloNIDine (CATAPRES - DOSED IN MG/24 HR) 0.3 mg/24hr patch Place 0.3 mg onto the skin once a week. Wednesdays   07/25/2015  . CRANBERRY PO Take 500 mg by mouth daily.    07/30/2015 at Unknown time  . cyanocobalamin (,VITAMIN B-12,) 1000 MCG/ML injection INJECT 1 ML INTO THE MUSCLE EVERY 30 DAYS 1 mL 11 over 30 days  . ferrous sulfate 325 (65 FE) MG tablet Take 1 tablet (325 mg total) by mouth 2 (two) times daily with a meal. 60 tablet 1 07/30/2015 at Unknown time  . furosemide (LASIX) 20 MG tablet 1 tab by mouth per day as needed for swelling (Patient taking differently: Take 20 mg by mouth daily as needed for fluid or edema. ) 90 tablet 3 over 30 days  . hydrALAZINE (APRESOLINE) 25 MG tablet Take 1 tablet (25 mg total) by mouth 3 (three) times daily. 270 tablet 3 07/31/2015 at Unknown time  . hydroxypropyl methylcellulose / hypromellose (ISOPTO TEARS / GONIOVISC) 2.5 % ophthalmic solution Place 1 drop into both eyes 3 (three) times daily as needed for dry eyes.   over 30 days  . insulin glargine (LANTUS) 100 UNIT/ML  injection Inject 0.2 mLs (20 Units total) into the skin daily. 30 mL 3 07/30/2015 at Unknown time  . labetalol (NORMODYNE) 200 MG tablet Take 400 mg by mouth 2 (two) times daily.   07/30/2015 at 2100  . pantoprazole (PROTONIX) 40 MG tablet TAKE 1 BY MOUTH DAILY (Patient taking differently: Take one tablet by mouth once daily for stomach) 90 tablet 3 07/31/2015 at Unknown time  . polyethylene glycol (MIRALAX / GLYCOLAX) packet Take 17 g by mouth 2 (two) times daily.   07/30/2015 at Unknown time  . pravastatin (PRAVACHOL) 20 MG tablet Take 20 mg by mouth daily at 6 PM.    07/30/2015 at Unknown time  . tuberculin (TUBERSOL) 5 UNIT/0.1ML injection Inject 5 Units into the skin once.   07/30/2015 at Unknown time  . warfarin (COUMADIN) 2 MG tablet Take 2 mg by mouth daily at 6 PM.   07/30/2015 at 2100  . warfarin (COUMADIN) 1 MG tablet 1.5 mg  daily recheck PT/INW 1 week on 07/30/15 (Patient not taking: Reported on 07/31/2015)   Not Taking at Unknown time    Assessment: 81 yo F on Coumadin PTA for hx afib, PE, DVT was admitted with AMS and urosepsis.  Pt currently NPO pending swallow evaluation.  Per MD, will initiation heparin if/when INR <2 while Coumadin on hold due to NPO status. INR this am is now down to 1.99. Hgb 10.3, plts wnl. No s/s of bleed.  Goal of Therapy:  INR 2-3 Heparin level 0.3-0.7 units/ml   Plan:  No heparin BOLUS Start heparin gtt at 950 units/hr Check 8 hr HL Monitor daily HL, CBC, s/s of bleed F/U restart of Coumadin once able to take [place], PharmD, [facility] Clinical Pharmacist Pager [phone] 08/02/2015 8:03 AM

## 2015-08-03 DIAGNOSIS — G8929 Other chronic pain: Secondary | ICD-10-CM

## 2015-08-03 DIAGNOSIS — R1031 Right lower quadrant pain: Secondary | ICD-10-CM

## 2015-08-03 LAB — COMPREHENSIVE METABOLIC PANEL
ALBUMIN: 2.4 g/dL — AB (ref 3.5–5.0)
ALT: 82 U/L — ABNORMAL HIGH (ref 14–54)
AST: 79 U/L — AB (ref 15–41)
Alkaline Phosphatase: 143 U/L — ABNORMAL HIGH (ref 38–126)
Anion gap: 10 (ref 5–15)
BUN: 30 mg/dL — AB (ref 6–20)
CHLORIDE: 107 mmol/L (ref 101–111)
CO2: 23 mmol/L (ref 22–32)
Calcium: 9.1 mg/dL (ref 8.9–10.3)
Creatinine, Ser: 1.15 mg/dL — ABNORMAL HIGH (ref 0.44–1.00)
GFR calc Af Amer: 46 mL/min — ABNORMAL LOW (ref >60)
GFR, EST NON AFRICAN AMERICAN: 40 mL/min — AB (ref >60)
Glucose, Bld: 217 mg/dL — ABNORMAL HIGH (ref 65–99)
POTASSIUM: 3.8 mmol/L (ref 3.5–5.1)
Sodium: 140 mmol/L (ref 135–145)
Total Bilirubin: 0.7 mg/dL (ref 0.3–1.2)
Total Protein: 5.3 g/dL — ABNORMAL LOW (ref 6.5–8.1)

## 2015-08-03 LAB — GLUCOSE, CAPILLARY
GLUCOSE-CAPILLARY: 232 mg/dL — AB (ref 65–99)
GLUCOSE-CAPILLARY: 257 mg/dL — AB (ref 65–99)
GLUCOSE-CAPILLARY: 97 mg/dL (ref 65–99)
Glucose-Capillary: 225 mg/dL — ABNORMAL HIGH (ref 65–99)
Glucose-Capillary: 192 mg/dL — ABNORMAL HIGH (ref 65–99)
Glucose-Capillary: 322 mg/dL — ABNORMAL HIGH (ref 65–99)

## 2015-08-03 LAB — CBC
HCT: 32.6 % — ABNORMAL LOW (ref 36.0–46.0)
Hemoglobin: 10.2 g/dL — ABNORMAL LOW (ref 12.0–15.0)
MCH: 27.6 pg (ref 26.0–34.0)
MCHC: 31.3 g/dL (ref 30.0–36.0)
MCV: 88.3 fL (ref 78.0–100.0)
PLATELETS: 194 10*3/uL (ref 150–400)
RBC: 3.69 MIL/uL — ABNORMAL LOW (ref 3.87–5.11)
RDW: 15.8 % — AB (ref 11.5–15.5)
WBC: 3.7 10*3/uL — AB (ref 4.0–10.5)

## 2015-08-03 LAB — HEPARIN LEVEL (UNFRACTIONATED): HEPARIN UNFRACTIONATED: 0.46 [IU]/mL (ref 0.30–0.70)

## 2015-08-03 LAB — STREP PNEUMONIAE URINARY ANTIGEN: STREP PNEUMO URINARY ANTIGEN: NEGATIVE

## 2015-08-03 MED ORDER — HYDRALAZINE HCL 20 MG/ML IJ SOLN
5.0000 mg | INTRAMUSCULAR | Status: DC | PRN
  Administered 2015-08-04 – 2015-08-07 (×9): 5 mg via INTRAVENOUS
  Filled 2015-08-03 (×9): qty 1

## 2015-08-03 MED ORDER — METHYLPREDNISOLONE SODIUM SUCC 40 MG IJ SOLR
40.0000 mg | INTRAMUSCULAR | Status: DC
  Administered 2015-08-04: 40 mg via INTRAVENOUS
  Filled 2015-08-03: qty 1

## 2015-08-03 MED ORDER — CETYLPYRIDINIUM CHLORIDE 0.05 % MT LIQD
7.0000 mL | Freq: Two times a day (BID) | OROMUCOSAL | Status: DC
  Administered 2015-08-04 – 2015-08-07 (×7): 7 mL via OROMUCOSAL

## 2015-08-03 MED ORDER — SODIUM CHLORIDE 0.45 % IV SOLN
INTRAVENOUS | Status: DC
  Administered 2015-08-03 – 2015-08-04 (×2): via INTRAVENOUS

## 2015-08-03 MED ORDER — IPRATROPIUM-ALBUTEROL 0.5-2.5 (3) MG/3ML IN SOLN
3.0000 mL | Freq: Three times a day (TID) | RESPIRATORY_TRACT | Status: DC
  Administered 2015-08-04: 3 mL via RESPIRATORY_TRACT
  Filled 2015-08-03: qty 3

## 2015-08-03 NOTE — Progress Notes (Signed)
ANTICOAGULATION CONSULT NOTE - Follow Up Consult  Pharmacy Consult for Heparin while Coumadin on hold Indication: hx atrial fibrillation, pulmonary embolus and DVT  Allergies  Allergen Reactions  . Amiodarone Other (See Comments)    REACTION: f? fluid retention  . Deltasone [Prednisone] Other (See Comments)    unknown  . Diltiazem Hcl Other (See Comments)    unknown  . Levofloxacin Nausea And Vomiting    REACTION: nausea  . Pregabalin Other (See Comments)    REACTION: hallucinations  . Spironolactone Other (See Comments)    REACTION: elevated K at lowest dose  . Tequin [Gatifloxacin] Other (See Comments)    Hypoglycemia  . Diazepam Anxiety and Other (See Comments)    REACTION: agitation    Patient Measurements: Height: 5\' 5"  (165.1 cm) Weight: 140 lb (63.504 kg) IBW/kg (Calculated) : 57 Heparin Dosing Weight: 63 kg  Vital Signs: Temp: 97.6 F (36.4 C) (12/02 0821) Temp Source: Oral (12/02 0821) BP: 176/91 mmHg (12/02 0800) Pulse Rate: 120 (12/02 0800)  Labs:  Recent Labs  07/31/15 1536 07/31/15 1719 08/01/15 0507 08/02/15 0600 08/02/15 1634 08/02/15 2321 08/03/15 0343  HGB  --   --  11.5* 10.3*  --   --  10.2*  HCT  --   --  35.8* 32.7*  --   --  32.6*  PLT  --   --  200 185  --   --  194  APTT 48* 46*  --   --   --   --   --   LABPROT 20.8* 22.0* 23.6* 22.5*  --   --   --   INR 1.80* 1.94* 2.12* 1.99*  --   --   --   HEPARINUNFRC  --   --   --   --  0.35 0.46 0.46  CREATININE  --   --  1.20* 1.14*  --   --  1.15*    Estimated Creatinine Clearance: 28.1 mL/min (by C-G formula based on Cr of 1.15).   Medications:  Prescriptions prior to admission  Medication Sig Dispense Refill Last Dose  . acetaminophen (TYLENOL) 325 MG tablet Take 2 tablets (650 mg total) by mouth 3 (three) times daily.   07/31/2015 at 0600  . AMBULATORY NON FORMULARY MEDICATION Medication Name: Med pass 120 cc by mouth two times daily in between meals   07/30/2015 at Unknown time   . benztropine (COGENTIN) 0.5 MG tablet Take 0.5 mg by mouth daily. For involuntary jerks   07/30/2015 at Unknown time  . cloNIDine (CATAPRES - DOSED IN MG/24 HR) 0.3 mg/24hr patch Place 0.3 mg onto the skin once a week. Wednesdays   07/25/2015  . CRANBERRY PO Take 500 mg by mouth daily.    07/30/2015 at Unknown time  . cyanocobalamin (,VITAMIN B-12,) 1000 MCG/ML injection INJECT 1 ML INTO THE MUSCLE EVERY 30 DAYS 1 mL 11 over 30 days  . ferrous sulfate 325 (65 FE) MG tablet Take 1 tablet (325 mg total) by mouth 2 (two) times daily with a meal. 60 tablet 1 07/30/2015 at Unknown time  . furosemide (LASIX) 20 MG tablet 1 tab by mouth per day as needed for swelling (Patient taking differently: Take 20 mg by mouth daily as needed for fluid or edema. ) 90 tablet 3 over 30 days  . hydrALAZINE (APRESOLINE) 25 MG tablet Take 1 tablet (25 mg total) by mouth 3 (three) times daily. 270 tablet 3 07/31/2015 at Unknown time  . hydroxypropyl methylcellulose / hypromellose (ISOPTO  TEARS / GONIOVISC) 2.5 % ophthalmic solution Place 1 drop into both eyes 3 (three) times daily as needed for dry eyes.   over 30 days  . insulin glargine (LANTUS) 100 UNIT/ML injection Inject 0.2 mLs (20 Units total) into the skin daily. 30 mL 3 07/30/2015 at Unknown time  . labetalol (NORMODYNE) 200 MG tablet Take 400 mg by mouth 2 (two) times daily.   07/30/2015 at 2100  . pantoprazole (PROTONIX) 40 MG tablet TAKE 1 BY MOUTH DAILY (Patient taking differently: Take one tablet by mouth once daily for stomach) 90 tablet 3 07/31/2015 at Unknown time  . polyethylene glycol (MIRALAX / GLYCOLAX) packet Take 17 g by mouth 2 (two) times daily.   07/30/2015 at Unknown time  . pravastatin (PRAVACHOL) 20 MG tablet Take 20 mg by mouth daily at 6 PM.    07/30/2015 at Unknown time  . tuberculin (TUBERSOL) 5 UNIT/0.1ML injection Inject 5 Units into the skin once.   07/30/2015 at Unknown time  . warfarin (COUMADIN) 2 MG tablet Take 2 mg by mouth daily at  6 PM.   07/30/2015 at 2100  . warfarin (COUMADIN) 1 MG tablet 1.5 mg daily recheck PT/INW 1 week on 07/30/15 (Patient not taking: Reported on 07/31/2015)   Not Taking at Unknown time    Assessment: 81 yo F on Coumadin PTA for hx afib, PE, DVT was admitted with AMS and urosepsis.  Pt has been seen by SLP who recommends Dys 1 diet and whole meds with puree.  Initiated on heparin 12/1 and heparin level remains therapeutic on 950 units/hr.  Per Dr. Sherral Hammers, need to discuss [place] to determine if patient should continue Coumadin at discharge.  Goal of Therapy:  INR 2-3 Heparin level 0.3-0.7 units/ml   Plan:  Continue heparin gtt at 950 units/hr Monitor daily HL, CBC, s/s of bleed F/U restart of Coumadin once able to take PO vs discontinue permanently  [employer], Pharm.D., BCPS Clinical Pharmacist Pager [phone] 08/03/2015 11:06 AM

## 2015-08-03 NOTE — Progress Notes (Signed)
ANTICOAGULATION CONSULT NOTE - Follow Up Consult  Pharmacy Consult for heparin Indication: h/o Afib/PE/DVT   Labs:  Recent Labs  07/31/15 1248  07/31/15 1536 07/31/15 1719 08/01/15 0507 08/02/15 0600 08/02/15 1634 08/02/15 2321  HGB 11.1*  --   --   --  11.5* 10.3*  --   --   HCT 35.6*  --   --   --  35.8* 32.7*  --   --   PLT 169  --   --   --  200 185  --   --   APTT  --   --  48* 46*  --   --   --   --   LABPROT  --   < > 20.8* 22.0* 23.6* 22.5*  --   --   INR  --   < > 1.80* 1.94* 2.12* 1.99*  --   --   HEPARINUNFRC  --   --   --   --   --   --  0.35 0.46  CREATININE 1.35*  --   --   --  1.20* 1.14*  --   --   < > = values in this interval not displayed.    Assessment/Plan:  81yo female remains therapeutic on heparin. Will continue gtt at current rate and confirm stable with am labs.   Wynona Neat, PharmD, BCPS  08/03/2015,12:44 AM

## 2015-08-03 NOTE — Care Management Important Message (Signed)
Important Message  Patient Details  Name: Lindsay Sheppard MRN: [id number] Date of Birth: May 18, 1921   Medicare Important Message Given:  Yes    [name] 08/03/2015, 2:04 PM

## 2015-08-03 NOTE — Progress Notes (Signed)
Physical Therapy Treatment Patient Details Name: Lindsay Sheppard MRN: [id number] DOB: [birth year] Today's Date: 08/03/2015    History of Present Illness 81 y/o with multiple medical problems, recently discharged from the hospital due to acute toxic metabolic encephalopathy thought to be 2/2 PNA and UTI d/c to [facility]. Pt admitted 2/2 AMS and hallucinations w/ episodes fo UE stiffening and twitching. CT/MRI/MRA of brain shows no acute abnormality. PMH includes MI, chronic AF, recurrent DVT and PE, chronic diastolic heart failure, syncope.    PT Comments    Mrs. Binkowski made modest progress today, performing sit<>stand w/ min +2 A and stand pivot w/ mod +2 A.  Hr up to 120's w/ mobility but down to 80-90s at rest.  Pt will benefit from continued skilled PT services to increase functional independence and safety.   Follow Up Recommendations  SNF;Supervision/Assistance - 24 hour     Equipment Recommendations  Other (comment) (TBD at next venue of care)    Recommendations for Other Services       Precautions / Restrictions Precautions Precautions: Fall Restrictions Weight Bearing Restrictions: No    Mobility  Bed Mobility Overal bed mobility: Needs Assistance;+2 for physical assistance Bed Mobility: Rolling;Sidelying to Sit Rolling: Min assist;+2 for physical assistance Sidelying to sit: Mod assist;+2 for physical assistance;HOB elevated       General bed mobility comments: A using bed pad to roll and A provided to trunk and managing Bil LEs to push up to sitting.  Use of bed rails and increased time w/ cues provided.  Transfers Overall transfer level: Needs assistance Equipment used: Rolling walker (2 wheeled) Transfers: Sit to/from [facility] Sit to Stand: Min assist;+2 physical assistance Stand pivot transfers: +2 physical assistance;Mod assist       General transfer comment: A to boost up to standing and to manage RW during pivot.  Cues for  technique and correct hand placement, pt shuffles to pivot.  Ambulation/Gait                 Stairs            Wheelchair Mobility    Modified Rankin (Stroke Patients Only)       Balance Overall balance assessment: Needs assistance Sitting-balance support: Bilateral upper extremity supported;Feet supported Sitting balance-Leahy Scale: Fair Sitting balance - Comments: Close min guard assist for pt's safety.  Posterior lean at times but improves w/ cues to keep Bil feet on floor. Postural control: Posterior lean Standing balance support: Bilateral upper extremity supported;During functional activity Standing balance-Leahy Scale: Poor Standing balance comment: Relies on RW and A for support                    Cognition Arousal/Alertness: Lethargic Behavior During Therapy: Flat affect (although somewhat more animated today) Overall Cognitive Status: Impaired/Different from baseline Area of Impairment: Orientation;Attention;Memory;Following commands;Safety/judgement;Awareness;Problem solving Orientation Level: Disoriented to;Time;Situation;Person;Place (says she was born in 1959, says it is also 1959 right now) Current Attention Level: Sustained Memory: Decreased short-term memory Following Commands: Follows one step commands consistently;Follows one step commands with increased time Safety/Judgement: Decreased awareness of safety;Decreased awareness of deficits Awareness: Intellectual Problem Solving: Slow processing;Decreased initiation;Difficulty sequencing;Requires verbal cues;Requires tactile cues General Comments: Pt more alert today but continues to not be oriented, believing she is at home.    Exercises General Exercises - Lower Extremity Ankle Circles/Pumps: Both;10 reps;Supine;AROM Heel Slides: AROM;Both;10 reps;Supine    General Comments General comments (skin integrity, edema, etc.): HR up to 120's w/  bed mobility but returns to 80-90s while at  rest.  Communicated w/ pt's daughter when to expect to see PT next.        Pertinent Vitals/Pain Pain Assessment: No/denies pain Faces Pain Scale: No hurt Pain Intervention(s): Limited activity within patient's tolerance;Monitored during session    Home Living                      Prior Function            PT Goals (current goals can now be found in the care plan section) Acute Rehab PT Goals PT Goal Formulation: With patient/family Time For Goal Achievement: 08/22/15 Potential to Achieve Goals: Fair Progress towards PT goals: Progressing toward goals    Frequency  Min 2X/week    PT Plan Current plan remains appropriate    Co-evaluation             End of Session Equipment Utilized During Treatment: Gait belt Activity Tolerance: Patient limited by fatigue Patient left: with call bell/phone within reach;with family/visitor present;in chair;with chair alarm set     Time: [id number] PT Time Calculation (min) (ACUTE ONLY): 24 min  Charges:  $Therapeutic Exercise: 8-22 mins $Therapeutic Activity: 8-22 mins                    G Codes:      Joslyn Hy PT, DPT [phone] Pager: [phone] 08/03/2015, 4:51 PM

## 2015-08-03 NOTE — Progress Notes (Signed)
[facility] TEAM 1 - Stepdown/ICU TEAM Progress Note  Lindsay Sheppard [id number] DOB: Jun 08, 1921 DOA: 07/31/2015 PCP: [name], MD  Admit HPI / Brief Narrative: 81 y.o. WF PMHx Depressive DO, DM Type 2 with neuropathy, HTN, Chronic Atrial Fibrillation, Chronic Diastolic CHF, Syncope, DVT/PE on chronic Coumadin, Compression Fx L-spine/T-spine, Renal Insufficiency, Lt Renal Cyst, Nephrolithiasis, recent urinary tract infection,  Presents to the emergency department for the second time this week with confusion and lethargy. On Sunday, 10/30 the patient was seen in the emergency department for UTI. She was prescribed Keflex and discharged home. She returns today lethargic and confused unable to give her an history. History was collected from her daughter at bedside. Per her daughter for the last 48-72 hours the patient has had nausea and dry heaving, she has complained of right lower quadrant abdominal pain. Normally the patient gets up with the assistance of her daughters in the morning but this morning she was too lethargic to do so. Her lethargy and confusion prompted the daughters to bring her back to the hospital. The patient has a chronic Foley catheter that was changed last on October 7 (prior to the discovery of her urinary tract infection). Reportedly the patient's bowel movements have been normal. She has had a lot of dry heaving but no emesis. She has been complaining congestion in the back of her throat.  In the emergency department her blood pressure is 225/87, labs are otherwise unrevealing, CT head is negative for acute stroke or bleed, chest x-ray shows left base opacity.  Patient is lethargic and hard of hearing and unable to give review of systems.  HPI/Subjective: 12/2 A/O 3 (did not know where), negative CP, negative SOB, negative N/V  Assessment/Plan:  Hypertensive emergency -BP 225/87 in the ER with confusion and lethargy -Decrease home dose labetalol to 300 mg   BID -Clonidine, 0.3 mg transdermal -Hydralazine IV 5 mg PRN SBP>160 or DBP> 100 -Lasix and other BP medication   Chronic atrial fibrillation -Will start patient on heparin drip. Goals of care need to be clarified would not send patient home on any anticoagulation -Will address at goals of care meeting with family on Sunday  Community acquired pneumonia versus Aspiration pneumonia -Left base opacity seen on chest x-ray. Has a history of Aspiration. -Imipenem per pharmacy; will cover both aspiration pneumonia and her Enterobacter Aerogenes UTI  -Speech therapy; recommends NPO  -Mucinex DM BID  -Flutter valve -Physiotherapy vest BID -Decrease Solu-Medrol 40 mg daily continue taper -DuoNeb QID -Influenza panel and fluent A/B/H1N1 negative -Strep pneumo urine antigen negative -Legionella urine antigen pending -Sputum culture pending -Continue hydration 0.45% saline 62ml/hr  Confusion and lethargy -Most likely multifactorial to include HTN, pneumonia, UTI, dementia  CT head is negative. Neuro exam is nonfocal. We will treat underlying problems.  Abdominal pain Right lower quadrant pain -Uncertain etiology.  -Patient has a 5.8x4.6 cm mass in her right colon found on CT scan in 09/2014. Family has opted not to work this up most likely cancerous.  -Palliative care consult placed  Urinary tract infection positive ENTEROBACTER AEROGENES -Resistant to Zosyn.  -Continue Imipenem per pharmacy  -Change Foley catheter. This catheter was last changed on 10/7.  Diabetes mellitus with neuropathy -Controlled.  -Increase to resistant SSI  History of DVT and PE -Continue Coumadin per pharmacy. INR is therapeutic.  Goals of care -81 year old with multiple medical problems clarify short-term versus long-term goals of care -NOTE patient most likely has malignancy in her:: Along with other system  failures. -12/2 met with daughters and NPSarah Hughes Better, they agree to DO NOT RESUSCITATE  however requested additional meeting on $RemoveBe'Sunday with other siblings to discuss palliative care vs hospice -PT/OT; recommends SNF    Code Status: DO NOT RESUSCITATE Family Communication: no family present at time of exam Disposition Plan: Await palliative care recommendations    Consultants:   Procedure/Significant Events: 11/29 MRI brain;I No acute infarct or acute intracranial abnormality -Chronic distal left vertebral artery occlusion.     Culture 11/29 urine positive ENTEROBACTER AEROGENES 12 /1 Influenza A/B/H1 N1 negative 12/1Strep pneumo urine antigen negative 12/1Legionella urine antigen pending 12/1Sputum culture pending   Antibiotics: Azithromycin Ceftriaxone Zosyn 11/29>> 12/1 Imipenem 12/1>>   DVT prophylaxis: Heparin infusion   Devices    LINES / TUBES:      Continuous Infusions: . dextrose 5 % and 0.45% NaCl 75 mL/hr at 08/03/15 0019  . heparin 950 Units/hr (08/03/15 0600)    Objective: VITAL SIGNS: Temp: 97.9 F (36.6 C) (12/02 0400) Temp Source: Oral (12/02 0400) BP: 152/78 mmHg (12/02 0400) Pulse Rate: 123 (12/02 0400) SPO2; FIO2:   Intake/Output Summary (Last 24 hours) at 08/03/15 0806 Last data filed at 08/03/15 0640  Gross per 24 hour  Intake   1214 ml  Output   1141 ml  Net     73'clTkxzryL$  ml     Exam: General:  A/O 3 (did not know where), sleepy but arousable No acute respiratory distress Eyes: Negative headache, eye pain, double vision,negative scleral hemorrhage ENT: Negative Runny nose, negative ear pain, negative gingival bleeding, Neck:  Negative scars, masses, torticollis, lymphadenopathy, JVD Lungs: Clear to auscultation bilaterally without wheezes or crackles Cardiovascular: Irregular irregular rhythm and rate, without murmur gallop or rub normal S1 and S2 Abdomen:negative abdominal pain, nondistended, positive soft, bowel sounds, no rebound, no ascites, no appreciable mass Extremities: No significant cyanosis,  clubbing, or edema bilateral lower extremities Psychiatric:  Negative depression, negative anxiety, negative fatigue, negative mania  Neurologic:  Cranial nerves II through XII intact, tongue/uvula midline, all extremities muscle strength 5/5, sensation intact throughout,  negative dysarthria, negative expressive aphasia, negative receptive aphasia.   Data Reviewed: Basic Metabolic Panel:  Recent Labs Lab 07/31/15 1248 08/01/15 0507 08/02/15 0600 08/03/15 0343  NA 136 139 141 140  K 4.4 3.9 3.7 3.8  CL 103 105 107 107  CO2 $Re'25 27 25 23  'ErW$ GLUCOSE 307* 83 136* 217*  BUN 27* 22* 25* 30*  CREATININE 1.35* 1.20* 1.14* 1.15*  CALCIUM 9.6 9.4 9.0 9.1   Liver Function Tests:  Recent Labs Lab 07/31/15 1248 08/02/15 0600 08/03/15 0343  AST 22 99* 79*  ALT 29 81* 82*  ALKPHOS 74 156* 143*  BILITOT 0.7 0.5 0.7  PROT 5.9* 5.2* 5.3*  ALBUMIN 3.3* 2.4* 2.4*   No results for input(s): LIPASE, AMYLASE in the last 168 hours.  Recent Labs Lab 07/31/15 1537  AMMONIA 11   CBC:  Recent Labs Lab 07/31/15 1248 08/01/15 0507 08/02/15 0600 08/03/15 0343  WBC 10.6* 14.3* 4.8 3.7*  NEUTROABS 8.9*  --   --   --   HGB 11.1* 11.5* 10.3* 10.2*  HCT 35.6* 35.8* 32.7* 32.6*  MCV 88.3 86.9 87.7 88.3  PLT 169 200 185 194   Cardiac Enzymes: No results for input(s): CKTOTAL, CKMB, CKMBINDEX, TROPONINI in the last 168 hours. BNP (last 3 results) No results for input(s): BNP in the last 8760 hours.  ProBNP (last 3 results) No results for input(s): PROBNP in the  last 8760 hours.  CBG:  Recent Labs Lab 08/02/15 1626 08/02/15 1958 08/03/15 0009 08/03/15 0432 08/03/15 0748  GLUCAP 308* 267* 97 225* 192*    Recent Results (from the past 240 hour(s))  Urine culture     Status: None   Collection Time: 07/31/15  3:15 PM  Result Value Ref Range Status   Specimen Description URINE, RANDOM  Final   Special Requests NONE  Final   Culture >=100,000 COLONIES/mL ENTEROBACTER AEROGENES   Final   Report Status 08/02/2015 FINAL  Final   Organism ID, Bacteria ENTEROBACTER AEROGENES  Final      Susceptibility   Enterobacter aerogenes - MIC*    CEFAZOLIN >=64 RESISTANT Resistant     CEFTRIAXONE 16 INTERMEDIATE Intermediate     CIPROFLOXACIN <=0.25 SENSITIVE Sensitive     GENTAMICIN <=1 SENSITIVE Sensitive     IMIPENEM 1 SENSITIVE Sensitive     NITROFURANTOIN <=16 SENSITIVE Sensitive     TRIMETH/SULFA <=20 SENSITIVE Sensitive     PIP/TAZO >=128 RESISTANT Resistant     * >=100,000 COLONIES/mL ENTEROBACTER AEROGENES  Blood culture (routine x 2)     Status: None (Preliminary result)   Collection Time: 07/31/15  3:50 PM  Result Value Ref Range Status   Specimen Description BLOOD RIGHT ARM  Final   Special Requests BOTTLES DRAWN AEROBIC AND ANAEROBIC 5CC  Final   Culture NO GROWTH 2 DAYS  Final   Report Status PENDING  Incomplete  Blood culture (routine x 2)     Status: None (Preliminary result)   Collection Time: 07/31/15  3:55 PM  Result Value Ref Range Status   Specimen Description BLOOD RIGHT HAND  Final   Special Requests BOTTLES DRAWN AEROBIC ONLY [place]  Final   Culture NO GROWTH 2 DAYS  Final   Report Status PENDING  Incomplete  MRSA PCR Screening     Status: None   Collection Time: 07/31/15  8:52 PM  Result Value Ref Range Status   MRSA by PCR NEGATIVE NEGATIVE Final    Comment:        The GeneXpert MRSA Assay (FDA approved for NASAL specimens only), is one component of a comprehensive MRSA colonization surveillance program. It is not intended to diagnose MRSA infection nor to guide or monitor treatment for MRSA infections.      Studies:  Recent x-ray studies have been reviewed in detail by the Attending Physician  Scheduled Meds:  Scheduled Meds: .  stroke: mapping our early stages of recovery book   Does not apply Once  . benztropine  0.5 mg Oral Daily  . cloNIDine  0.3 mg Transdermal Weekly  . dextromethorphan-guaiFENesin  1 tablet Oral BID  .  imipenem-cilastatin  250 mg Intravenous 3 times per day  . insulin aspart  0-20 Units Subcutaneous 6 times per day  . ipratropium-albuterol  3 mL Nebulization Q6H  . labetalol  300 mg Oral BID  . methylPREDNISolone (SOLU-MEDROL) injection  60 mg Intravenous Q24H  . pantoprazole  40 mg Oral Daily  . polyethylene glycol  17 g Oral BID  . pravastatin  20 mg Oral q1800    Time spent on care of this patient: 40 mins   WOODS, Geraldo Docker , MD  [facility] Office  [phone] Pager - [phone]  On-Call/Text Page:      [email or web address]      password [email or web address]  If 7PM-7AM, please contact night-coverage [email or web address] Password [email or web address] 08/03/2015, 8:06 AM   LOS: 3 days   Care during the  described time interval was provided by me .  I have reviewed this patient's available data, including medical history, events of note, physical examination, and all test results as part of my evaluation. I have personally reviewed and interpreted all radiology studies.   Dia Crawford, MD [phone] Pager

## 2015-08-03 NOTE — Care Management Note (Addendum)
Case Management Note  Patient Details  Name: Lindsay Sheppard MRN: [id number] Date of Birth: [birth year]  Subjective/Objective:                 Admitted with Acute encephalopathy/UTI from SNF. Pt has a chronic indwelling foley,  dCHF, occluded vertebral artery, a-fib, recurrent DVT/PE , colon mass.  Action/Plan: Discharge planning. Palliative team following. CM to f/u with disposition needs.  Expected Discharge Date:                  Expected Discharge Plan:  [place] (Pt from [facility])  In-House Referral:  Clinical Social Work  Discharge planning Services  CM Consult  Post Acute Care Choice:    Choice offered to:     DME Arranged:    DME Agency:     HH Arranged:    [place] Agency:     Status of Service:  In process, will continue to follow  Medicare Important Message Given:    Date Medicare IM Given:    Medicare IM give by:    Date Additional Medicare IM Given:    Additional Medicare Important Message give by:     If discussed at [place] of Stay Meetings, dates discussed:    Additional Comments:Bonnie B Garnette Gunner (Daughter) [phone], Georgie Chard (Daughter)  [phone]      Sharin Mons, RN, Durel Salts [phone] 08/03/2015, 1:26 PM

## 2015-08-03 NOTE — Progress Notes (Signed)
Speech Language Pathology Treatment: Dysphagia  Patient Details Name: Lindsay Sheppard MRN: [id number] DOB: September 23, 1920 Today's Date: 08/03/2015 Time: [id number] SLP Time Calculation (min) (ACUTE ONLY): 22 min  Assessment / Plan / Recommendation Clinical Impression  Pt sustains arousal for POs, has tolerated breakfast per her daughter. SLP provided sips of nectar thick liquids; pt able to follow second swallow strategy x3 after initial verbal cue. One delayed cough observed after laying down after trials. Also attempted placement of dentures for trials of solids; dentures would not adhere to gums/palate. Apparently pt often struggles with this. For now continue current diet of puree and nectar with precautions. SLP will f/u next week for potential upgrade.    HPI HPI: Lindsay Sheppard is a 81 y.o. 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 The patient presented with complains of worsening of confusion starting 07/30/15, suspect sepsis due to UTI. Pt has a chronic foley catheter. Pt was recently hospitalized for acute encephalopathy which whas thought to be secondary to pneumonia and UTI. MBS 07/09/15 recommended dysphagia 2, nectar-thick liquids due to decreased laryngeal closure with silent penetration of nectar x1 and silent aspiration with thin, pt was discharged on dysphagia diet. Current CXR, showed left lower lobe atelectasis and/or pneumonia, not significantly changed since 07/06/2015. Current MRI showed no acute finding.       SLP Plan  Continue with current plan of care     Recommendations  Diet recommendations: Dysphagia 1 (puree);Nectar-thick liquid Liquids provided via: Cup Medication Administration: Whole meds with puree Supervision: Staff to assist with self feeding;Trained caregiver to feed patient;Full supervision/cueing for  compensatory strategies Compensations: Slow rate;Small sips/bites;Multiple dry swallows after each bite/sip Postural Changes and/or Swallow Maneuvers: Seated upright 90 degrees              Oral Care Recommendations: Oral care BID Follow up Recommendations: Skilled Nursing facility Plan: Continue with current plan of care  [facility], MA CCC-SLP [phone]  Lynann Beaver 08/03/2015, 11:37 AM

## 2015-08-03 NOTE — Progress Notes (Signed)
Inpatient Diabetes Program Recommendations  AACE/ADA: New Consensus Statement on Inpatient Glycemic Control (2015)  Target Ranges:  Prepandial:   less than 140 mg/dL      Peak postprandial:   less than 180 mg/dL (1-2 hours)      Critically ill patients:  140 - 180 mg/dL  Results for MRY, URIAS (MRN [id number]) as of 08/03/2015 09:57  Ref. Range 08/02/2015 05:01 08/02/2015 11:35 08/02/2015 16:26 08/02/2015 19:58 08/03/2015 00:09 08/03/2015 04:32 08/03/2015 07:48  Glucose-Capillary Latest Ref Range: 65-99 mg/dL 123 (H) 170 (H) 308 (H)  11 units 267 (H)  11 units 97 225 (H)  7 units 192 (H)  4 units   Review of Glycemic Control  Current orders for Inpatient glycemic control: Novolog 0-20 units Q4H  Inpatient Diabetes Program Recommendations: Insulin - Basal: Patient has been started on Solumedrol which is contributing to hyperglycemia. Please consider ordering low dose basal insulin; recommend starting with Lantus 6 units Q24H (based on 63 kg x 0.1 units).  Thanks, Barnie Alderman, RN, MSN, CDE Diabetes Coordinator Inpatient Diabetes Program [phone] (Team Pager from [place] to [place]) [phone] ([facility] office) [phone] [facility] office) [phone] [facility] office)

## 2015-08-04 DIAGNOSIS — I2782 Chronic pulmonary embolism: Secondary | ICD-10-CM | POA: Diagnosis present

## 2015-08-04 DIAGNOSIS — I2690 Septic pulmonary embolism without acute cor pulmonale: Secondary | ICD-10-CM

## 2015-08-04 DIAGNOSIS — G8929 Other chronic pain: Secondary | ICD-10-CM | POA: Diagnosis present

## 2015-08-04 DIAGNOSIS — R1031 Right lower quadrant pain: Secondary | ICD-10-CM

## 2015-08-04 LAB — COMPREHENSIVE METABOLIC PANEL
ALBUMIN: 2.6 g/dL — AB (ref 3.5–5.0)
ALT: 94 U/L — ABNORMAL HIGH (ref 14–54)
ANION GAP: 8 (ref 5–15)
AST: 88 U/L — AB (ref 15–41)
Alkaline Phosphatase: 118 U/L (ref 38–126)
BUN: 33 mg/dL — AB (ref 6–20)
CHLORIDE: 109 mmol/L (ref 101–111)
CO2: 21 mmol/L — ABNORMAL LOW (ref 22–32)
Calcium: 9.4 mg/dL (ref 8.9–10.3)
Creatinine, Ser: 1.18 mg/dL — ABNORMAL HIGH (ref 0.44–1.00)
GFR calc Af Amer: 45 mL/min — ABNORMAL LOW (ref >60)
GFR, EST NON AFRICAN AMERICAN: 39 mL/min — AB (ref >60)
Glucose, Bld: 163 mg/dL — ABNORMAL HIGH (ref 65–99)
POTASSIUM: 3.7 mmol/L (ref 3.5–5.1)
Sodium: 138 mmol/L (ref 135–145)
Total Bilirubin: 0.5 mg/dL (ref 0.3–1.2)
Total Protein: 5.1 g/dL — ABNORMAL LOW (ref 6.5–8.1)

## 2015-08-04 LAB — GLUCOSE, CAPILLARY
GLUCOSE-CAPILLARY: 108 mg/dL — AB (ref 65–99)
GLUCOSE-CAPILLARY: 155 mg/dL — AB (ref 65–99)
GLUCOSE-CAPILLARY: 180 mg/dL — AB (ref 65–99)
GLUCOSE-CAPILLARY: 230 mg/dL — AB (ref 65–99)
Glucose-Capillary: 182 mg/dL — ABNORMAL HIGH (ref 65–99)
Glucose-Capillary: 174 mg/dL — ABNORMAL HIGH (ref 65–99)

## 2015-08-04 LAB — CBC
HCT: 31.3 % — ABNORMAL LOW (ref 36.0–46.0)
HEMOGLOBIN: 9.6 g/dL — AB (ref 12.0–15.0)
MCH: 27 pg (ref 26.0–34.0)
MCHC: 30.7 g/dL (ref 30.0–36.0)
MCV: 88.2 fL (ref 78.0–100.0)
Platelets: 224 10*3/uL (ref 150–400)
RBC: 3.55 MIL/uL — AB (ref 3.87–5.11)
RDW: 15.7 % — ABNORMAL HIGH (ref 11.5–15.5)
WBC: 4.1 10*3/uL (ref 4.0–10.5)

## 2015-08-04 LAB — PROTIME-INR
INR: 2.11 — ABNORMAL HIGH (ref 0.00–1.49)
PROTHROMBIN TIME: 23.5 s — AB (ref 11.6–15.2)

## 2015-08-04 LAB — MAGNESIUM: MAGNESIUM: 2 mg/dL (ref 1.7–2.4)

## 2015-08-04 LAB — HEPARIN LEVEL (UNFRACTIONATED): Heparin Unfractionated: 0.62 IU/mL (ref 0.30–0.70)

## 2015-08-04 MED ORDER — IPRATROPIUM-ALBUTEROL 0.5-2.5 (3) MG/3ML IN SOLN: 3.0000 mL | Freq: Four times a day (QID) | RESPIRATORY_TRACT | Status: DC | PRN

## 2015-08-04 MED ORDER — INSULIN GLARGINE 100 UNIT/ML ~~LOC~~ SOLN
10.0000 [IU] | Freq: Every day | SUBCUTANEOUS | Status: DC
  Administered 2015-08-04 – 2015-08-07 (×3): 10 [IU] via SUBCUTANEOUS
  Filled 2015-08-04 (×7): qty 0.1

## 2015-08-04 MED ORDER — IPRATROPIUM-ALBUTEROL 0.5-2.5 (3) MG/3ML IN SOLN: 3.0000 mL | Freq: Four times a day (QID) | RESPIRATORY_TRACT | Status: DC

## 2015-08-04 NOTE — Consult Note (Signed)
Consultation Note Date: 08/04/2015   Patient Name: Lindsay Sheppard  DOB: [birth year]  MRN: [id number]  Age / Sex: 81 y.o., female  PCP: Biagio Borg, MD Referring Physician: Allie Bossier, MD  Reason for Consultation: Establishing goals of care    Clinical Assessment/Narrative: Pt is a 81 yo female seen by PMT before in Jan 2016 for [place]. She is admitted again with urosepsis. Pt has a chronic indwelling foley. She also has medical co morbidities of dCHF, occluded vertebral artery, a-fib, recurrent DVT/PE as well as colon mass discovered in Jan which is suspicious for malignancy. Met with daughters, Ivin Booty and [name] last night. Pt has 3 sons as well. Dr. Sherral Hammers was also present for part of the meeting. Daughters struggling between comfort care and taking her home with hospice and paid caregivers vs. giving her " another chance" with rehab. Had a lengthy discussion about likely cause of death will be from another infection, likely UTI. This was discussed not as if this happens but when. Daughters tearful, but verbalized understanding. 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 We also addressed very limited options moving forward in terms of abx. Her current urinary infection only sensitive to 1 IV abx and ! PO. Pt not a candidate for suppression because of her CKD 3, also Cipro only po option.  Contacts/Participants in Discussion: Daughters Ivin Booty and Set designer: daughters but they do make decisions as a family   Relationship to Patient children HCPOA: no  Pt has 2 daughters and 3 sons. Dr. Sherral Hammers also present for [place] discussion  SUMMARY OF RECOMMENDATIONS Continue antibiotics and supportive care Continue DNR/DNI Will have another National discussion  08/05/15 at 3pm with all children to help them process comfort care vs. skilled care, as well as further antibiotics, bipap  Code Status/Advance Care Planning: DNR    Code Status Orders        Start     Ordered   07/31/15 1657  Do not attempt resuscitation (DNR)   Continuous    Question Answer Comment  In the event of cardiac or respiratory ARREST Do not call a "code blue"   In the event of cardiac or respiratory ARREST Do not perform Intubation, CPR, defibrillation or ACLS   In the event of cardiac or respiratory ARREST Use medication by any route, position, wound care, and other measures to relive pain and suffering. May use oxygen, suction and manual treatment of airway obstruction as needed for comfort.      07/31/15 1700    Advance Directive Documentation        Most Recent Value   Type of Advance Directive  Healthcare Power of [place], Living will [Bonnie B Baity daughter,Sharon B May Daughter]   Pre-existing out of facility DNR order (yellow form or pink MOST form)     "MOST" Form in Place?        Other Directives:None  Symptom Management:   Pain: Pt denying pain at this point. Cont with tylenol. Monitor for need for agents to address moderate to severe pain  Dyspnea: Improving with targeted pulmonary tx. Cont with 02, steroids, nebs. No opioids at this point   Palliative Prophylaxis:   Aspiration, Frequent Pain Assessment, Oral Care and Turn Reposition  Additional Recommendations (Limitations, Scope, Preferences):  No Chemotherapy, No  Hemodialysis, No Radiation, No Surgical Procedures and No Tracheostomy  Psycho-social/Spiritual:  Support System: Strong Desire for further Chaplaincy support:no   Prognosis: Less than 6 months. Would qualify for in-home hospice  Discharge Planning: TBD. Family struggling between taking her home with hospice vs. SNF, rehab   Chief Complaint/ Primary Diagnoses: Present on Admission:  . Sepsis secondary to UTI ([place]) .  Myoclonic jerking . B12 deficiency . Hyperlipidemia . Essential hypertension . Atrial fibrillation ([place]) . DIASTOLIC HEART FAILURE, CHRONIC . Protein-calorie malnutrition, severe ([place]) . Dementia . Acute encephalopathy . Aspiration pneumonia of right lower lobe due to vomit ([place]) . Urinary tract infection, site not specified . Type 2 diabetes mellitus with diabetic neuropathy, without long-term current use of insulin ([place]) . Pulmonary embolism without acute cor pulmonale ([place]) . Abdominal pain, chronic, right lower quadrant . Chronic pulmonary embolism ([place])  I have reviewed the medical record, interviewed the patient and family, and examined the patient. The following aspects are pertinent.  Past Medical History  Diagnosis Date  . POSTHERPETIC NEURALGIA 11/02/2009  . B12 DEFICIENCY 05/23/2007  . HYPERLIPIDEMIA 05/23/2007  . HYPERKALEMIA 05/30/2010  . ANEMIA-IRON DEFICIENCY 01/26/2007  . BLEPHARITIS, BILATERAL 02/23/2008  . Other specified forms of hearing loss 05/30/2010  . HYPERTENSION 01/26/2007  . Atrial fibrillation ([place]) 05/23/2007  . DIASTOLIC HEART FAILURE, CHRONIC 12/14/2009  . POSTURAL HYPOTENSION 02/07/2008  . ALLERGIC RHINITIS 11/02/2009  . GERD 01/26/2007  . CONSTIPATION 01/25/2008  . SKIN LESION 05/30/2010  . BACK PAIN 08/02/2007  . OSTEOPOROSIS 05/23/2007  . SYNCOPE 02/07/2008  . FATIGUE 09/06/2007  . Dysuria 09/05/2008  . Abdominal pain, epigastric 09/06/2007  . EPIGASTRIC TENDERNESS 10/12/2007  . PULMONARY EMBOLISM, HX OF 05/23/2007  . SHINGLES, HX OF 05/23/2007  . 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 Left foot pain   . Diabetic neuropathy ([place])   . RENAL INSUFFICIENCY 07/02/2009  . RENAL CYST, LEFT 09/06/2007  . Kidney stones     "never had OR"  . Family history of anesthesia complication     "daughter has PONV; another daughter a little anesthesia lasts way too long"  . CHF (congestive heart failure) ([place]) 2011  . Myocardial infarction [facility]) 2011    "mild"  . DVT (deep venous  thrombosis) ([place]) ~ 1964    "think it was in the left"  . Aspiration pneumonia ([place]) ~ 2006    "due to aspiration"  . DIABETES MELLITUS, TYPE II 09/06/2007  . DEPRESSIVE DISORDER 01/25/2008    "@ times; never treated for it"  . Melanoma of nose ([place])   . Compression fracture of lumbar vertebra ([facility])     hx  . Compression fracture of thoracic vertebra ([place]) 02/07/2014    "fell this am" (02/07/2014  . Arthritis     "hands" (03/15/2014)  . Foley catheter in place on admission    Social History   Social History  . Marital Status: Widowed    Spouse Name: N/A  . Number of Children: 5  . Years of Education: N/A   Occupational History  . Retired    Social History Main Topics  . Smoking status: Never Smoker   . Smokeless tobacco: Never Used  . Alcohol Use: No  . Drug Use: No  . Sexual Activity: No   Other Topics Concern  . None   Social History Narrative   Family History  Problem Relation Age of Onset  . Breast cancer Mother   . Colon cancer Father   . Stroke Father  died with stroke postop  . Diabetes Sister     2 sisters  . Hypertension Other    Scheduled Meds: .  stroke: mapping our early stages of recovery book   Does not apply Once  . antiseptic oral rinse  7 mL Mouth Rinse BID  . benztropine  0.5 mg Oral Daily  . cloNIDine  0.3 mg Transdermal Weekly  . dextromethorphan-guaiFENesin  1 tablet Oral BID  . imipenem-cilastatin  250 mg Intravenous 3 times per day  . insulin aspart  0-20 Units Subcutaneous 6 times per day  . ipratropium-albuterol  3 mL Nebulization TID  . labetalol  300 mg Oral BID  . methylPREDNISolone (SOLU-MEDROL) injection  40 mg Intravenous Q24H  . pantoprazole  40 mg Oral Daily  . polyethylene glycol  17 g Oral BID  . pravastatin  20 mg Oral q1800   Continuous Infusions: . sodium chloride 75 mL/hr at 08/03/15 2123  . heparin 900 Units/hr (08/04/15 1032)   PRN Meds:.acetaminophen **OR** acetaminophen, hydrALAZINE, RESOURCE THICKENUP  CLEAR Medications Prior to Admission:  Prior to Admission medications   Medication Sig Start Date End Date Taking? Authorizing Provider  acetaminophen (TYLENOL) 325 MG tablet Take 2 tablets (650 mg total) by mouth 3 (three) times daily. 07/23/15  Yes Lauree Chandler, NP  AMBULATORY NON FORMULARY MEDICATION Medication Name: Med pass 120 cc by mouth two times daily in between meals   Yes Historical Provider, MD  benztropine (COGENTIN) 0.5 MG tablet Take 0.5 mg by mouth daily. For involuntary jerks   Yes Historical Provider, MD  cloNIDine (CATAPRES - DOSED IN MG/24 HR) 0.3 mg/24hr patch Place 0.3 mg onto the skin once a week. Wednesdays   Yes Historical Provider, MD  CRANBERRY PO Take 500 mg by mouth daily.    Yes Historical Provider, MD  cyanocobalamin (,VITAMIN B-12,) 1000 MCG/ML injection INJECT 1 ML INTO THE MUSCLE EVERY 30 DAYS 04/26/15  Yes Biagio Borg, MD  ferrous sulfate 325 (65 FE) MG tablet Take 1 tablet (325 mg total) by mouth 2 (two) times daily with a meal. 02/15/14  Yes Ivan Anchors Love, PA-C  furosemide (LASIX) 20 MG tablet 1 tab by mouth per day as needed for swelling Patient taking differently: Take 20 mg by mouth daily as needed for fluid or edema.  12/05/14  Yes Biagio Borg, MD  hydrALAZINE (APRESOLINE) 25 MG tablet Take 1 tablet (25 mg total) by mouth 3 (three) times daily. 12/13/14  Yes Biagio Borg, MD  hydroxypropyl methylcellulose / hypromellose (ISOPTO TEARS / GONIOVISC) 2.5 % ophthalmic solution Place 1 drop into both eyes 3 (three) times daily as needed for dry eyes.   Yes Historical Provider, MD  insulin glargine (LANTUS) 100 UNIT/ML injection Inject 0.2 mLs (20 Units total) into the skin daily. 03/06/15  Yes Biagio Borg, MD  labetalol (NORMODYNE) 200 MG tablet Take 400 mg by mouth 2 (two) times daily.   Yes Historical Provider, MD  pantoprazole (PROTONIX) 40 MG tablet TAKE 1 BY MOUTH DAILY Patient taking differently: Take one tablet by mouth once daily for stomach 09/12/14  Yes  Biagio Borg, MD  polyethylene glycol [facility] / Floria Raveling) packet Take 17 g by mouth 2 (two) times daily. 07/23/15  Yes Lauree Chandler, NP  pravastatin (PRAVACHOL) 20 MG tablet Take 20 mg by mouth daily at 6 PM.    Yes Historical Provider, MD  tuberculin (TUBERSOL) 5 UNIT/0.1ML injection Inject 5 Units into the skin once.   Yes  Historical Provider, MD  warfarin (COUMADIN) 2 MG tablet Take 2 mg by mouth daily at 6 PM.   Yes Historical Provider, MD  warfarin (COUMADIN) 1 MG tablet 1.5 mg daily recheck PT/INW 1 week on 07/30/15 Patient not taking: Reported on 07/31/2015 07/23/15   Lauree Chandler, NP   Allergies  Allergen Reactions  . Amiodarone Other (See Comments)    REACTION: f? fluid retention  . Deltasone [Prednisone] Other (See Comments)    unknown  . Diltiazem Hcl Other (See Comments)    unknown  . Levofloxacin Nausea And Vomiting    REACTION: nausea  . Pregabalin Other (See Comments)    REACTION: hallucinations  . Spironolactone Other (See Comments)    REACTION: elevated K at lowest dose  . Tequin [Gatifloxacin] Other (See Comments)    Hypoglycemia  . Diazepam Anxiety and Other (See Comments)    REACTION: agitation    Review of Systems  Unable to perform ROS: Mental status change    Physical Exam  Constitutional: She appears well-developed.  HENT:  Head: Normocephalic.  Cardiovascular:  Irrg, tachy  Respiratory:  Mild increased resp effort  Genitourinary:  foley  Musculoskeletal:  Stiff, very weak. Was a complete 2-person left back to bed  Neurological:  Very HOH; confused  Skin: Skin is warm and dry.  facial bruising  Psychiatric:  Affect constricted    Vital Signs: BP 146/81 mmHg  Pulse 121  Temp(Src) 97.9 F (36.6 C) (Oral)  Resp 23  Ht $R'5\' 5"'WL$  (1.651 m)  Wt 63.504 kg (140 lb)  BMI 23.30 kg/m2  SpO2 97%  SpO2: SpO2: 97 % O2 Device:SpO2: 97 % O2 Flow Rate: .   IO: Intake/output summary:  Intake/Output Summary (Last 24 hours) at 08/04/15  1037 Last data filed at 08/04/15 9417  Gross per 24 hour  Intake   2399 ml  Output    200 ml  Net   2199 ml    LBM: Last BM Date: 08/02/15 Baseline Weight: Weight: 63.504 kg (140 lb) Most recent weight: Weight: 63.504 kg (140 lb)      Palliative Assessment/Data:  Flowsheet Rows        Most Recent Value   Intake Tab    Referral Department  Hospitalist   Unit at Time of Referral  Intermediate Care Unit   Palliative Care Primary Diagnosis  Sepsis/Infectious Disease   Date Notified  08/02/15   Palliative Care Type  Return patient Palliative Care   Reason for referral  Clarify Goals of Care, Counsel Regarding Hospice   Date of Admission  07/31/15   Date first seen by Palliative Care  08/03/15   # of days Palliative referral response time  1 Day(s)   # of days IP prior to Palliative referral  2   Clinical Assessment    Palliative Performance Scale Score  30%   Pain Max last 24 hours  Other (Comment)   Pain Min Last 24 hours  Other (Comment)   Dyspnea Max Last 24 Hours  Other (Comment)   Dyspnea Min Last 24 hours  Other (Comment)   Nausea Max Last 24 Hours  Other (Comment)   Nausea Min Last 24 Hours  Other (Comment)   Anxiety Max Last 24 Hours  Other (Comment)   Anxiety Min Last 24 Hours  Other (Comment)   Other Max Last 24 Hours  Other (Comment)   Psychosocial & Spiritual Assessment    Social Work Plan of Care  Counseling   Palliative Care Outcomes  Patient/Family meeting held?  Yes   Who was at the meeting?  2 daughters   Palliative Care Outcomes  Counseled regarding hospice, Provided psychosocial or spiritual support   Palliative Care follow-up planned  Yes, Facility      Additional Data Reviewed:  CBC:    Component Value Date/Time   WBC 4.1 08/04/2015 0449   WBC 5.3 07/16/2015   HGB 9.6* 08/04/2015 0449   HCT 31.3* 08/04/2015 0449   PLT 224 08/04/2015 0449   MCV 88.2 08/04/2015 0449   NEUTROABS 8.9* 07/31/2015 1248   LYMPHSABS 0.6* 07/31/2015 1248   MONOABS  1.0 07/31/2015 1248   EOSABS 0.1 07/31/2015 1248   BASOSABS 0.0 07/31/2015 1248   Comprehensive Metabolic Panel:    Component Value Date/Time   NA 140 08/03/2015 0343   NA 146 07/16/2015   K 3.8 08/03/2015 0343   CL 107 08/03/2015 0343   CO2 23 08/03/2015 0343   BUN 30* 08/03/2015 0343   BUN 24* 07/16/2015   CREATININE 1.15* 08/03/2015 0343   CREATININE 1.0 07/16/2015   GLUCOSE 217* 08/03/2015 0343   CALCIUM 9.1 08/03/2015 0343   AST 79* 08/03/2015 0343   ALT 82* 08/03/2015 0343   ALKPHOS 143* 08/03/2015 0343   BILITOT 0.7 08/03/2015 0343   PROT 5.3* 08/03/2015 0343   ALBUMIN 2.4* 08/03/2015 0343     Time In: 1600 Time Out: 1815 Time Total: 135 min Greater than 50%  of this time was spent counseling and coordinating care related to the above assessment and plan. Staffed with Dr. Sherral Hammers  Signed by: Dory Horn, NP  Dory Horn, NP  08/04/2015, 10:37 AM  Please contact Palliative Medicine Team phone at [phone] for questions and concerns.

## 2015-08-04 NOTE — Progress Notes (Signed)
ANTICOAGULATION CONSULT NOTE - Follow Up Consult  Pharmacy Consult for Heparin while Coumadin on hold Indication: hx atrial fibrillation, pulmonary embolus and DVT  Allergies  Allergen Reactions  . Amiodarone Other (See Comments)    REACTION: f? fluid retention  . Deltasone [Prednisone] Other (See Comments)    unknown  . Diltiazem Hcl Other (See Comments)    unknown  . Levofloxacin Nausea And Vomiting    REACTION: nausea  . Pregabalin Other (See Comments)    REACTION: hallucinations  . Spironolactone Other (See Comments)    REACTION: elevated K at lowest dose  . Tequin [Gatifloxacin] Other (See Comments)    Hypoglycemia  . Diazepam Anxiety and Other (See Comments)    REACTION: agitation    Patient Measurements: Height: 5\' 5"  (165.1 cm) Weight: 140 lb (63.504 kg) IBW/kg (Calculated) : 57 Heparin Dosing Weight: 63 kg  Vital Signs: Temp: 97.9 F (36.6 C) (12/03 0754) Temp Source: Oral (12/03 0754) BP: 146/81 mmHg (12/03 0754) Pulse Rate: 121 (12/03 0754)  Labs:  Recent Labs  08/02/15 0600  08/02/15 2321 08/03/15 0343 08/04/15 0449  HGB 10.3*  --   --  10.2* 9.6*  HCT 32.7*  --   --  32.6* 31.3*  PLT 185  --   --  194 224  LABPROT 22.5*  --   --   --  23.5*  INR 1.99*  --   --   --  2.11*  HEPARINUNFRC  --   < > 0.46 0.46 0.62  CREATININE 1.14*  --   --  1.15*  --   < > = values in this interval not displayed.  Estimated Creatinine Clearance: 28.1 mL/min (by C-G formula based on Cr of 1.15).   Medications:  Prescriptions prior to admission  Medication Sig Dispense Refill Last Dose  . acetaminophen (TYLENOL) 325 MG tablet Take 2 tablets (650 mg total) by mouth 3 (three) times daily.   07/31/2015 at 0600  . AMBULATORY NON FORMULARY MEDICATION Medication Name: Med pass 120 cc by mouth two times daily in between meals   07/30/2015 at Unknown time  . benztropine (COGENTIN) 0.5 MG tablet Take 0.5 mg by mouth daily. For involuntary jerks   07/30/2015 at Unknown  time  . cloNIDine (CATAPRES - DOSED IN MG/24 HR) 0.3 mg/24hr patch Place 0.3 mg onto the skin once a week. Wednesdays   07/25/2015  . CRANBERRY PO Take 500 mg by mouth daily.    07/30/2015 at Unknown time  . cyanocobalamin (,VITAMIN B-12,) 1000 MCG/ML injection INJECT 1 ML INTO THE MUSCLE EVERY 30 DAYS 1 mL 11 over 30 days  . ferrous sulfate 325 (65 FE) MG tablet Take 1 tablet (325 mg total) by mouth 2 (two) times daily with a meal. 60 tablet 1 07/30/2015 at Unknown time  . furosemide (LASIX) 20 MG tablet 1 tab by mouth per day as needed for swelling (Patient taking differently: Take 20 mg by mouth daily as needed for fluid or edema. ) 90 tablet 3 over 30 days  . hydrALAZINE (APRESOLINE) 25 MG tablet Take 1 tablet (25 mg total) by mouth 3 (three) times daily. 270 tablet 3 07/31/2015 at Unknown time  . hydroxypropyl methylcellulose / hypromellose (ISOPTO TEARS / GONIOVISC) 2.5 % ophthalmic solution Place 1 drop into both eyes 3 (three) times daily as needed for dry eyes.   over 30 days  . insulin glargine (LANTUS) 100 UNIT/ML injection Inject 0.2 mLs (20 Units total) into the skin daily. 30 mL 3  07/30/2015 at Unknown time  . labetalol (NORMODYNE) 200 MG tablet Take 400 mg by mouth 2 (two) times daily.   07/30/2015 at 2100  . pantoprazole (PROTONIX) 40 MG tablet TAKE 1 BY MOUTH DAILY (Patient taking differently: Take one tablet by mouth once daily for stomach) 90 tablet 3 07/31/2015 at Unknown time  . polyethylene glycol (MIRALAX / GLYCOLAX) packet Take 17 g by mouth 2 (two) times daily.   07/30/2015 at Unknown time  . pravastatin (PRAVACHOL) 20 MG tablet Take 20 mg by mouth daily at 6 PM.    07/30/2015 at Unknown time  . tuberculin (TUBERSOL) 5 UNIT/0.1ML injection Inject 5 Units into the skin once.   07/30/2015 at Unknown time  . warfarin (COUMADIN) 2 MG tablet Take 2 mg by mouth daily at 6 PM.   07/30/2015 at 2100  . warfarin (COUMADIN) 1 MG tablet 1.5 mg daily recheck PT/INW 1 week on 07/30/15  (Patient not taking: Reported on 07/31/2015)   Not Taking at Unknown time    Assessment: 81 yo F on Coumadin PTA for hx afib, PE, DVT was admitted with AMS and urosepsis.  Initially held on admission when patient was NPO due to concerns of aspiration.  Pt has been seen by SLP who recommends Dys 1 diet and whole meds with puree.  Heparin started 12/1 and heparin level remains therapeutic on 950 units/hr.  Heparin level has trended up over the last 24 hours and now noted that INR remains >2.  Will decrease heparin infusion rate.  Per Dr. Sherral Hammers, need to discuss [place] to determine if patient should continue Coumadin at discharge.  Goal of Therapy:  INR 2-3 Heparin level 0.3-0.7 units/ml   Plan:  Change heparin gtt to 900 units/hr Monitor daily HL, CBC, s/s of bleed F/U restart of Coumadin once able to take PO vs discontinue permanently  [employer], Pharm.D., BCPS Clinical Pharmacist Pager [phone] 08/04/2015 8:59 AM

## 2015-08-04 NOTE — Progress Notes (Signed)
Patient in SVT 160-170's. MD notified, no orders given. EKG complete. Pt complaining of abdominal pain.  1128: pt converted back to afib, 70's to 80s, with BP 112/70.

## 2015-08-04 NOTE — Progress Notes (Signed)
[facility] TEAM 1 - Stepdown/ICU TEAM Progress Note  Lindsay Sheppard:[id number] DOB: 1920/10/27 DOA: 07/31/2015 PCP: [name], MD  Admit HPI / Brief Narrative: 81 y.o. WF PMHx Depressive DO, DM Type 2 with neuropathy, HTN, Chronic Atrial Fibrillation, Chronic Diastolic CHF, Syncope, DVT/PE on chronic Coumadin, Compression Fx L-spine/T-spine, Renal Insufficiency, Lt Renal Cyst, Nephrolithiasis, recent urinary tract infection,  Presents to the emergency department for the second time this week with confusion and lethargy. On Sunday, 10/30 the patient was seen in the emergency department for UTI. She was prescribed Keflex and discharged home. She returns today lethargic and confused unable to give her an history. History was collected from her daughter at bedside. Per her daughter for the last 48-72 hours the patient has had nausea and dry heaving, she has complained of right lower quadrant abdominal pain. Normally the patient gets up with the assistance of her daughters in the morning but this morning she was too lethargic to do so. Her lethargy and confusion prompted the daughters to bring her back to the hospital. The patient has a chronic Foley catheter that was changed last on October 7 (prior to the discovery of her urinary tract infection). Reportedly the patient's bowel movements have been normal. She has had a lot of dry heaving but no emesis. She has been complaining congestion in the back of her throat.  In the emergency department her blood pressure is 225/87, labs are otherwise unrevealing, CT head is negative for acute stroke or bleed, chest x-ray shows left base opacity.  Patient is lethargic and hard of hearing and unable to give review of systems.  HPI/Subjective: 12/3 A/O 2 (did not know where, why), negative CP, negative SOB, negative N/V  Assessment/Plan:  Hypertensive emergency -BP 225/87 in the ER with confusion and lethargy -Increase home dose labetalol back to 400 mg   BID; patient becoming HTN and SVT -Clonidine, 0.3 mg transdermal -Hydralazine IV 5 mg PRN SBP>160 or DBP> 100  Chronic atrial fibrillation -Will start patient on heparin drip. Goals of care need to be clarified would not send patient home on any anticoagulation -Will address at goals of care meeting with family on Sunday  Community acquired pneumonia versus Aspiration pneumonia -Left base opacity seen on chest x-ray. Has a history of Aspiration. -Imipenem per pharmacy; will cover both aspiration pneumonia and her Enterobacter Aerogenes UTI  -Speech therapy; recommends NPO  -Mucinex DM BID  -Flutter valve -Physiotherapy vest BID -Decrease Solu-Medrol 40 mg daily continue taper -DuoNeb QID -Influenza panel and fluent A/B/H1N1 negative -Strep pneumo urine antigen negative -Legionella urine antigen pending -Sputum culture pending - 0.45% saline KVO   Confusion and lethargy -Most likely multifactorial to include HTN, pneumonia, UTI, dementia  CT head is negative. Neuro exam is nonfocal. We will treat underlying problems.  Abdominal pain Right lower quadrant pain -Uncertain etiology.  -Patient has a 5.8x4.6 cm mass in her right colon found on CT scan in 09/2014. Family has opted not to work this up most likely cancerous.  -Palliative care consult placed  Urinary tract infection positive ENTEROBACTER AEROGENES -Resistant to Zosyn.  -Continue Imipenem per pharmacy  -Change Foley catheter. This catheter was last changed on 10/7.  Diabetes mellitus with neuropathy CBG climbing patient not eating more.  -Increase to resistant SSI -Start Lantus 10 units QHS  History of DVT and PE -Continue Coumadin per pharmacy. INR is therapeutic.  Goals of care -81 year old with multiple medical problems clarify short-term versus long-term goals of care -NOTE  patient most likely has malignancy in her:: Along with other system failures. -12/2 met with daughters and NPSarah Hughes Better, they  agree to DO NOT RESUSCITATE however requested additional meeting on Sunday with other siblings to discuss palliative care vs hospice -PT/OT; recommends SNF    Code Status: DO NOT RESUSCITATE Family Communication: no family present at time of exam Disposition Plan: Await palliative care recommendations    Consultants:   Procedure/Significant Events: 11/29 MRI brain;I No acute infarct or acute intracranial abnormality -Chronic distal left vertebral artery occlusion.     Culture 11/29 urine positive ENTEROBACTER AEROGENES 12 /1 Influenza A/B/H1 N1 negative 12/1Strep pneumo urine antigen negative 12/1Legionella urine antigen pending 12/1Sputum culture pending   Antibiotics: Azithromycin Ceftriaxone Zosyn 11/29>> 12/1 Imipenem 12/1>>   DVT prophylaxis: Heparin infusion   Devices    LINES / TUBES:      Continuous Infusions: . sodium chloride 75 mL/hr at 08/03/15 2123  . heparin 950 Units/hr (08/04/15 0400)    Objective: VITAL SIGNS: Temp: 98.2 F (36.8 C) (12/03 0315) Temp Source: Oral (12/03 0315) BP: 146/81 mmHg (12/03 0754) Pulse Rate: 121 (12/03 0754) SPO2; FIO2:   Intake/Output Summary (Last 24 hours) at 08/04/15 8315 Last data filed at 08/04/15 1761  Gross per 24 hour  Intake   2399 ml  Output    201 ml  Net   2198 ml     Exam: General: A/O 2 (did not know where, why),, awake visiting with family, No acute respiratory distress Eyes: Negative headache, eye pain, double vision,negative scleral hemorrhage ENT: Negative Runny nose, negative ear pain, negative gingival bleeding, Neck:  Negative scars, masses, torticollis, lymphadenopathy, JVD Lungs: Clear to auscultation bilaterally without wheezes or crackles Cardiovascular: Irregular irregular rhythm and rate, without murmur gallop or rub normal S1 and S2 Abdomen:negative abdominal pain, nondistended, positive soft, bowel sounds, no rebound, no ascites, no appreciable mass Extremities: No  significant cyanosis, clubbing, or edema bilateral lower extremities Psychiatric:  Negative depression, negative anxiety, negative fatigue, negative mania  Neurologic:  Cranial nerves II through XII intact, tongue/uvula midline, all extremities muscle strength 5/5, sensation intact throughout,  negative dysarthria, negative expressive aphasia, negative receptive aphasia.   Data Reviewed: Basic Metabolic Panel:  Recent Labs Lab 07/31/15 1248 08/01/15 0507 08/02/15 0600 08/03/15 0343  NA 136 139 141 140  K 4.4 3.9 3.7 3.8  CL 103 105 107 107  CO2 _0 GLUCOSE 307* 83 136* 217*  BUN 27* 22* 25* 30*  CREATININE 1.35* 1.20* 1.14* 1.15*  CALCIUM 9.6 9.4 9.0 9.1   Liver Function Tests:  Recent Labs Lab 07/31/15 1248 08/02/15 0600 08/03/15 0343  AST 22 99* 79*  ALT 29 81* 82*  ALKPHOS 74 156* 143*  BILITOT 0.7 0.5 0.7  PROT 5.9* 5.2* 5.3*  ALBUMIN 3.3* 2.4* 2.4*   No results for input(s): LIPASE, AMYLASE in the last 168 hours.  Recent Labs Lab 07/31/15 1537  AMMONIA 11   CBC:  Recent Labs Lab 07/31/15 1248 08/01/15 0507 08/02/15 0600 08/03/15 0343 08/04/15 0449  WBC 10.6* 14.3* 4.8 3.7* 4.1  NEUTROABS 8.9*  --   --   --   --   HGB 11.1* 11.5* 10.3* 10.2* 9.6*  HCT 35.6* 35.8* 32.7* 32.6* 31.3*  MCV 88.3 86.9 87.7 88.3 88.2  PLT 169 200 185 194 224   Cardiac Enzymes: No results for input(s): CKTOTAL, CKMB, CKMBINDEX, TROPONINI in the last 168 hours. BNP (last 3 results) No results for input(s): BNP  in the last 8760 hours.  ProBNP (last 3 results) No results for input(s): PROBNP in the last 8760 hours.  CBG:  Recent Labs Lab 08/03/15 1532 08/03/15 1944 08/04/15 0012 08/04/15 0316 08/04/15 0727  GLUCAP 232* 322* 155* 108* 180*    Recent Results (from the past 240 hour(s))  Urine culture     Status: None   Collection Time: 07/31/15  3:15 PM  Result Value Ref Range Status   Specimen Description URINE, RANDOM  Final   Special Requests  NONE  Final   Culture >=100,000 COLONIES/mL ENTEROBACTER AEROGENES  Final   Report Status 08/02/2015 FINAL  Final   Organism ID, Bacteria ENTEROBACTER AEROGENES  Final      Susceptibility   Enterobacter aerogenes - MIC*    CEFAZOLIN >=64 RESISTANT Resistant     CEFTRIAXONE 16 INTERMEDIATE Intermediate     CIPROFLOXACIN <=0.25 SENSITIVE Sensitive     GENTAMICIN <=1 SENSITIVE Sensitive     IMIPENEM 1 SENSITIVE Sensitive     NITROFURANTOIN <=16 SENSITIVE Sensitive     TRIMETH/SULFA <=20 SENSITIVE Sensitive     PIP/TAZO >=128 RESISTANT Resistant     * >=100,000 COLONIES/mL ENTEROBACTER AEROGENES  Blood culture (routine x 2)     Status: None (Preliminary result)   Collection Time: 07/31/15  3:50 PM  Result Value Ref Range Status   Specimen Description BLOOD RIGHT ARM  Final   Special Requests BOTTLES DRAWN AEROBIC AND ANAEROBIC 5CC  Final   Culture NO GROWTH 3 DAYS  Final   Report Status PENDING  Incomplete  Blood culture (routine x 2)     Status: None (Preliminary result)   Collection Time: 07/31/15  3:55 PM  Result Value Ref Range Status   Specimen Description BLOOD RIGHT HAND  Final   Special Requests BOTTLES DRAWN AEROBIC ONLY [place]  Final   Culture NO GROWTH 3 DAYS  Final   Report Status PENDING  Incomplete  MRSA PCR Screening     Status: None   Collection Time: 07/31/15  8:52 PM  Result Value Ref Range Status   MRSA by PCR NEGATIVE NEGATIVE Final    Comment:        The GeneXpert MRSA Assay (FDA approved for NASAL specimens only), is one component of a comprehensive MRSA colonization surveillance program. It is not intended to diagnose MRSA infection nor to guide or monitor treatment for MRSA infections.      Studies:  Recent x-ray studies have been reviewed in detail by the Attending Physician  Scheduled Meds:  Scheduled Meds: .  stroke: mapping our early stages of recovery book   Does not apply Once  . antiseptic oral rinse  7 mL Mouth Rinse BID  . benztropine   0.5 mg Oral Daily  . cloNIDine  0.3 mg Transdermal Weekly  . dextromethorphan-guaiFENesin  1 tablet Oral BID  . imipenem-cilastatin  250 mg Intravenous 3 times per day  . insulin aspart  0-20 Units Subcutaneous 6 times per day  . ipratropium-albuterol  3 mL Nebulization TID  . labetalol  300 mg Oral BID  . methylPREDNISolone (SOLU-MEDROL) injection  40 mg Intravenous Q24H  . pantoprazole  40 mg Oral Daily  . polyethylene glycol  17 g Oral BID  . pravastatin  20 mg Oral q1800    Time spent on care of this patient: 40 mins   WOODS, Geraldo Docker , MD  [facility] Office  [phone] Pager - [phone]  On-Call/Text Page:      [email or web address]  password [email or web address]  If 7PM-7AM, please contact night-coverage [email or web address] Password [email or web address] 08/04/2015, 8:24 AM   LOS: 4 days   Care during the described time interval was provided by me .  I have reviewed this patient's available data, including medical history, events of note, physical examination, and all test results as part of my evaluation. I have personally reviewed and interpreted all radiology studies.   Dia Crawford, MD [phone] Pager

## 2015-08-05 DIAGNOSIS — I2699 Other pulmonary embolism without acute cor pulmonale: Secondary | ICD-10-CM

## 2015-08-05 DIAGNOSIS — J189 Pneumonia, unspecified organism: Secondary | ICD-10-CM | POA: Diagnosis present

## 2015-08-05 DIAGNOSIS — A419 Sepsis, unspecified organism: Secondary | ICD-10-CM | POA: Diagnosis present

## 2015-08-05 DIAGNOSIS — I481 Persistent atrial fibrillation: Secondary | ICD-10-CM

## 2015-08-05 LAB — GLUCOSE, CAPILLARY
GLUCOSE-CAPILLARY: 137 mg/dL — AB (ref 65–99)
GLUCOSE-CAPILLARY: 130 mg/dL — AB (ref 65–99)
Glucose-Capillary: 115 mg/dL — ABNORMAL HIGH (ref 65–99)
Glucose-Capillary: 118 mg/dL — ABNORMAL HIGH (ref 65–99)
Glucose-Capillary: 144 mg/dL — ABNORMAL HIGH (ref 65–99)
Glucose-Capillary: 150 mg/dL — ABNORMAL HIGH (ref 65–99)
Glucose-Capillary: 170 mg/dL — ABNORMAL HIGH (ref 65–99)

## 2015-08-05 LAB — PROTIME-INR
INR: 1.66 — ABNORMAL HIGH (ref 0.00–1.49)
PROTHROMBIN TIME: 19.6 s — AB (ref 11.6–15.2)

## 2015-08-05 LAB — COMPREHENSIVE METABOLIC PANEL
ALBUMIN: 2.5 g/dL — AB (ref 3.5–5.0)
ALT: 80 U/L — AB (ref 14–54)
ANION GAP: 9 (ref 5–15)
AST: 65 U/L — ABNORMAL HIGH (ref 15–41)
Alkaline Phosphatase: 98 U/L (ref 38–126)
BUN: 43 mg/dL — ABNORMAL HIGH (ref 6–20)
CHLORIDE: 109 mmol/L (ref 101–111)
CO2: 19 mmol/L — AB (ref 22–32)
Calcium: 9.1 mg/dL (ref 8.9–10.3)
Creatinine, Ser: 1.23 mg/dL — ABNORMAL HIGH (ref 0.44–1.00)
GFR calc non Af Amer: 37 mL/min — ABNORMAL LOW (ref >60)
GFR, EST AFRICAN AMERICAN: 43 mL/min — AB (ref >60)
Glucose, Bld: 130 mg/dL — ABNORMAL HIGH (ref 65–99)
Potassium: 4.7 mmol/L (ref 3.5–5.1)
SODIUM: 137 mmol/L (ref 135–145)
Total Bilirubin: 0.9 mg/dL (ref 0.3–1.2)
Total Protein: 4.6 g/dL — ABNORMAL LOW (ref 6.5–8.1)

## 2015-08-05 LAB — CULTURE, BLOOD (ROUTINE X 2)
CULTURE: NO GROWTH
CULTURE: NO GROWTH

## 2015-08-05 LAB — CBC
HCT: 30.2 % — ABNORMAL LOW (ref 36.0–46.0)
Hemoglobin: 9.3 g/dL — ABNORMAL LOW (ref 12.0–15.0)
MCH: 27 pg (ref 26.0–34.0)
MCHC: 30.8 g/dL (ref 30.0–36.0)
MCV: 87.8 fL (ref 78.0–100.0)
PLATELETS: 217 10*3/uL (ref 150–400)
RBC: 3.44 MIL/uL — ABNORMAL LOW (ref 3.87–5.11)
RDW: 15.8 % — AB (ref 11.5–15.5)
WBC: 4 10*3/uL (ref 4.0–10.5)

## 2015-08-05 LAB — HEPARIN LEVEL (UNFRACTIONATED): Heparin Unfractionated: 0.42 IU/mL (ref 0.30–0.70)

## 2015-08-05 LAB — MAGNESIUM: Magnesium: 1.8 mg/dL (ref 1.7–2.4)

## 2015-08-05 MED ORDER — SODIUM CHLORIDE 0.9 % IV SOLN
250.0000 mg | Freq: Three times a day (TID) | INTRAVENOUS | Status: DC
  Administered 2015-08-05 – 2015-08-06 (×3): 250 mg via INTRAVENOUS
  Filled 2015-08-05 (×6): qty 250

## 2015-08-05 MED ORDER — METOPROLOL TARTRATE 1 MG/ML IV SOLN
7.5000 mg | Freq: Once | INTRAVENOUS | Status: AC
  Administered 2015-08-05: 7.5 mg via INTRAVENOUS
  Filled 2015-08-05: qty 10

## 2015-08-05 MED ORDER — METHYLPREDNISOLONE SODIUM SUCC 40 MG IJ SOLR
20.0000 mg | INTRAMUSCULAR | Status: DC
  Administered 2015-08-06: 20 mg via INTRAVENOUS
  Filled 2015-08-05 (×2): qty 1

## 2015-08-05 NOTE — Progress Notes (Signed)
[facility] TEAM 1 - Stepdown/ICU TEAM Progress Note  Lindsay Sheppard UQJ:[id number] DOB: [birth year] DOA: 07/31/2015 PCP: [name], MD  Admit HPI / Brief Narrative: 81 y.o. WF PMHx Depressive DO, DM Type 2 with neuropathy, HTN, Chronic Atrial Fibrillation, Chronic Diastolic CHF, Syncope, DVT/PE on chronic Coumadin, Compression Fx L-spine/T-spine, Renal Insufficiency, Lt Renal Cyst, Nephrolithiasis, recent urinary tract infection,  Presents to the emergency department for the second time this week with confusion and lethargy. On Sunday, 10/30 the patient was seen in the emergency department for UTI. She was prescribed Keflex and discharged home. She returns today lethargic and confused unable to give her an history. History was collected from her daughter at bedside. Per her daughter for the last 48-72 hours the patient has had nausea and dry heaving, she has complained of right lower quadrant abdominal pain. Normally the patient gets up with the assistance of her daughters in the morning but this morning she was too lethargic to do so. Her lethargy and confusion prompted the daughters to bring her back to the hospital. The patient has a chronic Foley catheter that was changed last on October 7 (prior to the discovery of her urinary tract infection). Reportedly the patient's bowel movements have been normal. She has had a lot of dry heaving but no emesis. She has been complaining congestion in the back of her throat.  In the emergency department her blood pressure is 225/87, labs are otherwise unrevealing, CT head is negative for acute stroke or bleed, chest x-ray shows left base opacity.  Patient is lethargic and hard of hearing and unable to give review of systems.  HPI/Subjective: 12/4 A/O 1 (did not know where, when, why), negative CP, negative SOB, negative N/V  Assessment/Plan:  Hypertensive emergency -BP 225/87 in the ER with confusion and lethargy -Increase home dose labetalol back to  400 mg  BID; patient becoming HTN and SVT -Clonidine, 0.3 mg transdermal -Hydralazine IV 5 mg PRN SBP>160 or DBP> 100  Chronic atrial fibrillation -Will start patient on heparin drip. Goals of care need to be clarified would not send patient home on any anticoagulation -Will address at goals of care meeting with family on Sunday  Community acquired pneumonia versus Aspiration pneumonia -Left base opacity seen on chest x-ray. Has a history of Aspiration. -Imipenem per pharmacy; will cover both aspiration pneumonia and her Enterobacter Aerogenes UTI ; completed 7 days of treatment. -Speech therapy; recommends dysphagia 1 nectar thick liquid  -Mucinex DM BID  -Flutter valve -Decrease Solu-Medrol 20 mg daily continue taper, continue to titrate LSLHTDS@@@@ -DuoNeb QID -Influenza panel and fluent A/B/H1N1 negative -Strep pneumo urine antigen negative -Sputum culture pending - 0.45% saline KVO   Confusion and lethargy -Most likely multifactorial to include HTN, pneumonia, UTI, Dementia  -CT head is negative. Neuro exam is nonfocal. Will treat underlying problems.  Abdominal pain Right lower quadrant pain -Uncertain etiology.  -Patient has a 5.8x4.6 cm mass in her right colon found on CT scan in 09/2014. Family has opted not to work this up most likely cancerous.  -Palliative care consult placed  Urinary tract infection positive ENTEROBACTER AEROGENES -Resistant to Zosyn.  -Continue Imipenem per pharmacy  -Change Foley catheter. This catheter was last changed on 10/7.  Diabetes mellitus with neuropathy CBG climbing patient eating more.  -Increase to resistant SSI -Continue Lantus 10 units QHS  History of DVT and PE -Continue Coumadin per pharmacy. INR is therapeutic.  Goals of care -81 year old with multiple medical problems clarify short-term versus  long-term goals of care -NOTE patient most likely has malignancy in her:: Along with other system failures. -12/2 met with  daughters and NPSarah Hughes Sheppard, they agree to DO NOT RESUSCITATE however requested additional meeting on Sunday with other siblings to discuss palliative care vs hospice -PT/OT; recommends SNF -12/4 NPSarah Hughes Sheppard met with family and from note appears the plan is In Patient Hospice vs SNF with Rehabilitation and Palliative services    Code Status: DO NOT RESUSCITATE Family Communication: no family present at time of exam Disposition Plan: Await palliative care recommendations    Consultants:  NPSarah Hughes Sheppard palliative care  Procedure/Significant Events: 11/29 MRI brain;I No acute infarct or acute intracranial abnormality -Chronic distal left vertebral artery occlusion.     Culture 11/29 urine positive ENTEROBACTER AEROGENES 11/29 blood right arm/hand NGTD 12 /1 Influenza A/B/H1 N1 negative 12/1Strep pneumo urine antigen negative 12/1Legionella urine antigen not drawn 12/1Sputum culture not drawn   Antibiotics: Azithromycin Ceftriaxone Zosyn 11/29>> 12/1 Imipenem 12/1>>   DVT prophylaxis: Heparin infusion   Devices    LINES / TUBES:      Continuous Infusions: . sodium chloride 10 mL (08/04/15 2027)  . heparin 900 Units/hr (08/04/15 2000)    Objective: VITAL SIGNS: Temp: 97.4 F (36.3 C) (12/04 0801) Temp Source: Oral (12/04 0801) BP: 175/98 mmHg (12/04 0755) Pulse Rate: 84 (12/04 0419) SPO2; FIO2:   Intake/Output Summary (Last 24 hours) at 08/05/15 3559 Last data filed at 08/05/15 0600  Gross per 24 hour  Intake 1377.25 ml  Output    650 ml  Net 727.25 ml     Exam: General: A/O 1 (did not know where, when why),, awake visiting with family, No acute respiratory distress Eyes: Negative headache, eye pain, double vision,negative scleral hemorrhage ENT: Negative Runny nose, negative ear pain, negative gingival bleeding, Neck:  Negative scars, masses, torticollis, lymphadenopathy, JVD Lungs: Clear to auscultation bilaterally  without wheezes or crackles Cardiovascular: Irregular irregular rhythm and rate, without murmur gallop or rub normal S1 and S2 Abdomen:negative abdominal pain, nondistended, positive soft, bowel sounds, no rebound, no ascites, no appreciable mass Extremities: No significant cyanosis, clubbing, or edema bilateral lower extremities Psychiatric:  Negative depression, negative anxiety, negative fatigue, negative mania  Neurologic:  Cranial nerves II through XII intact, tongue/uvula midline, all extremities muscle strength 5/5, sensation intact throughout,  negative dysarthria, negative expressive aphasia, negative receptive aphasia.   Data Reviewed: Basic Metabolic Panel:  Recent Labs Lab 07/31/15 1248 08/01/15 0507 08/02/15 0600 08/03/15 0343 08/04/15 0930  NA 136 139 141 140 138  K 4.4 3.9 3.7 3.8 3.7  CL 103 105 107 107 109  CO2 $Re'25 27 25 23 'gyD$ 21*  GLUCOSE 307* 83 136* 217* 163*  BUN 27* 22* 25* 30* 33*  CREATININE 1.35* 1.20* 1.14* 1.15* 1.18*  CALCIUM 9.6 9.4 9.0 9.1 9.4  MG  --   --   --   --  2.0   Liver Function Tests:  Recent Labs Lab 07/31/15 1248 08/02/15 0600 08/03/15 0343 08/04/15 0930  AST 22 99* 79* 88*  ALT 29 81* 82* 94*  ALKPHOS 74 156* 143* 118  BILITOT 0.7 0.5 0.7 0.5  PROT 5.9* 5.2* 5.3* 5.1*  ALBUMIN 3.3* 2.4* 2.4* 2.6*   No results for input(s): LIPASE, AMYLASE in the last 168 hours.  Recent Labs Lab 07/31/15 1537  AMMONIA 11   CBC:  Recent Labs Lab 07/31/15 1248 08/01/15 0507 08/02/15 0600 08/03/15 0343 08/04/15 0449 08/05/15 0410  WBC 10.6* 14.3*  4.8 3.7* 4.1 4.0  NEUTROABS 8.9*  --   --   --   --   --   HGB 11.1* 11.5* 10.3* 10.2* 9.6* 9.3*  HCT 35.6* 35.8* 32.7* 32.6* 31.3* 30.2*  MCV 88.3 86.9 87.7 88.3 88.2 87.8  PLT 169 200 185 194 224 217   Cardiac Enzymes: No results for input(s): CKTOTAL, CKMB, CKMBINDEX, TROPONINI in the last 168 hours. BNP (last 3 results) No results for input(s): BNP in the last 8760 hours.  ProBNP  (last 3 results) No results for input(s): PROBNP in the last 8760 hours.  CBG:  Recent Labs Lab 08/04/15 1215 08/04/15 1556 08/04/15 2011 08/04/15 2345 08/05/15 0413  GLUCAP 182* 174* 230* 118* 144*    Recent Results (from the past 240 hour(s))  Urine culture     Status: None   Collection Time: 07/31/15  3:15 PM  Result Value Ref Range Status   Specimen Description URINE, RANDOM  Final   Special Requests NONE  Final   Culture >=100,000 COLONIES/mL ENTEROBACTER AEROGENES  Final   Report Status 08/02/2015 FINAL  Final   Organism ID, Bacteria ENTEROBACTER AEROGENES  Final      Susceptibility   Enterobacter aerogenes - MIC*    CEFAZOLIN >=64 RESISTANT Resistant     CEFTRIAXONE 16 INTERMEDIATE Intermediate     CIPROFLOXACIN <=0.25 SENSITIVE Sensitive     GENTAMICIN <=1 SENSITIVE Sensitive     IMIPENEM 1 SENSITIVE Sensitive     NITROFURANTOIN <=16 SENSITIVE Sensitive     TRIMETH/SULFA <=20 SENSITIVE Sensitive     PIP/TAZO >=128 RESISTANT Resistant     * >=100,000 COLONIES/mL ENTEROBACTER AEROGENES  Blood culture (routine x 2)     Status: None (Preliminary result)   Collection Time: 07/31/15  3:50 PM  Result Value Ref Range Status   Specimen Description BLOOD RIGHT ARM  Final   Special Requests BOTTLES DRAWN AEROBIC AND ANAEROBIC 5CC  Final   Culture NO GROWTH 4 DAYS  Final   Report Status PENDING  Incomplete  Blood culture (routine x 2)     Status: None (Preliminary result)   Collection Time: 07/31/15  3:55 PM  Result Value Ref Range Status   Specimen Description BLOOD RIGHT HAND  Final   Special Requests BOTTLES DRAWN AEROBIC ONLY [place]  Final   Culture NO GROWTH 4 DAYS  Final   Report Status PENDING  Incomplete  MRSA PCR Screening     Status: None   Collection Time: 07/31/15  8:52 PM  Result Value Ref Range Status   MRSA by PCR NEGATIVE NEGATIVE Final    Comment:        The GeneXpert MRSA Assay (FDA approved for NASAL specimens only), is one component of  a comprehensive MRSA colonization surveillance program. It is not intended to diagnose MRSA infection nor to guide or monitor treatment for MRSA infections.      Studies:  Recent x-ray studies have been reviewed in detail by the Attending Physician  Scheduled Meds:  Scheduled Meds: .  stroke: mapping our early stages of recovery book   Does not apply Once  . antiseptic oral rinse  7 mL Mouth Rinse BID  . benztropine  0.5 mg Oral Daily  . cloNIDine  0.3 mg Transdermal Weekly  . dextromethorphan-guaiFENesin  1 tablet Oral BID  . imipenem-cilastatin  250 mg Intravenous 3 times per day  . insulin aspart  0-20 Units Subcutaneous 6 times per day  . insulin glargine  10 Units Subcutaneous QHS  .  labetalol  300 mg Oral BID  . methylPREDNISolone (SOLU-MEDROL) injection  40 mg Intravenous Q24H  . pantoprazole  40 mg Oral Daily  . polyethylene glycol  17 g Oral BID  . pravastatin  20 mg Oral q1800    Time spent on care of this patient: 40 mins   WOODS, Geraldo Docker , MD  [facility] Office  [phone] Pager - [phone]  On-Call/Text Page:      [email or web address]      password [email or web address]  If 7PM-7AM, please contact night-coverage [email or web address] Password [email or web address] 08/05/2015, 8:37 AM   LOS: 5 days   Care during the described time interval was provided by me .  I have reviewed this patient's available data, including medical history, events of note, physical examination, and all test results as part of my evaluation. I have personally reviewed and interpreted all radiology studies.   Dia Crawford, MD [phone] Pager

## 2015-08-05 NOTE — Progress Notes (Signed)
ANTICOAGULATION and ANTIBIOTIC CONSULT NOTE - Follow Up Consult  Pharmacy Consult for Heparin while Coumadin on hold; Primaxin for UTI Indication: hx atrial fibrillation, pulmonary embolus and DVT  Allergies  Allergen Reactions  . Amiodarone Other (See Comments)    REACTION: f? fluid retention  . Deltasone [Prednisone] Other (See Comments)    unknown  . Diltiazem Hcl Other (See Comments)    unknown  . Levofloxacin Nausea And Vomiting    REACTION: nausea  . Pregabalin Other (See Comments)    REACTION: hallucinations  . Spironolactone Other (See Comments)    REACTION: elevated K at lowest dose  . Tequin [Gatifloxacin] Other (See Comments)    Hypoglycemia  . Diazepam Anxiety and Other (See Comments)    REACTION: agitation    Patient Measurements: Height: 5\' 5"  (165.1 cm) Weight: 140 lb (63.504 kg) IBW/kg (Calculated) : 57 Heparin Dosing Weight: 63 kg  Vital Signs: Temp: 97.4 F (36.3 C) (12/04 0801) Temp Source: Oral (12/04 0801) BP: 175/98 mmHg (12/04 0755) Pulse Rate: 84 (12/04 0419)  Labs:  Recent Labs  08/03/15 0343 08/04/15 0449 08/04/15 0930 08/05/15 0410  HGB 10.2* 9.6*  --  9.3*  HCT 32.6* 31.3*  --  30.2*  PLT 194 224  --  217  LABPROT  --  23.5*  --  19.6*  INR  --  2.11*  --  1.66*  HEPARINUNFRC 0.46 0.62  --  0.42  CREATININE 1.15*  --  1.18*  --     Estimated Creatinine Clearance: 27.4 mL/min (by C-G formula based on Cr of 1.18).   Medications:  .  stroke: mapping our early stages of recovery book   Does not apply Once  . antiseptic oral rinse  7 mL Mouth Rinse BID  . benztropine  0.5 mg Oral Daily  . cloNIDine  0.3 mg Transdermal Weekly  . dextromethorphan-guaiFENesin  1 tablet Oral BID  . imipenem-cilastatin  250 mg Intravenous 3 times per day  . insulin aspart  0-20 Units Subcutaneous 6 times per day  . insulin glargine  10 Units Subcutaneous QHS  . labetalol  300 mg Oral BID  . methylPREDNISolone (SOLU-MEDROL) injection  40 mg  Intravenous Q24H  . metoprolol  7.5 mg Intravenous Once  . pantoprazole  40 mg Oral Daily  . polyethylene glycol  17 g Oral BID  . pravastatin  20 mg Oral q1800   . sodium chloride 10 mL (08/04/15 2027)  . heparin 900 Units/hr (08/04/15 2000)    Assessment: 81 yo F on Coumadin PTA for hx afib, PE, DVT was admitted with AMS and urosepsis.  Initially held on admission when patient was NPO due to concerns of aspiration.  Pt has been seen by SLP who recommends Dys 1 diet and whole meds with puree.  Heparin started 12/1 and heparin level remains therapeutic on 900 units/hr.  No bleeding noted.  Pt also on day #4 IV Primaxin for Enterobacter UTI.  Pt is afebrile, WBC wnl, SCr 1.18.  No changes needed.  Per Dr. Sherral Hammers, need to discuss [place] to determine if patient should continue Coumadin at discharge.  Meeting scheduled for today at 3pm.  Goal of Therapy:  INR 2-3 Heparin level 0.3-0.7 units/ml   Plan:  Heparin gtt at 900 units/hr Monitor daily HL, CBC, s/s of bleed F/U restart of Coumadin once able to take PO vs discontinue permanently Continue Primaxin 250mg  IV q8h Follow-up anticipated antibiotic length of therapy.  [employer], Pharm.D., BCPS Clinical Pharmacist Pager [phone]  08/05/2015 8:12 AM
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
## 2015-08-06 DIAGNOSIS — F0390 Unspecified dementia without behavioral disturbance: Secondary | ICD-10-CM

## 2015-08-06 DIAGNOSIS — Z515 Encounter for palliative care: Secondary | ICD-10-CM

## 2015-08-06 DIAGNOSIS — N390 Urinary tract infection, site not specified: Secondary | ICD-10-CM

## 2015-08-06 LAB — CBC
HCT: 30.6 % — ABNORMAL LOW (ref 36.0–46.0)
Hemoglobin: 9.6 g/dL — ABNORMAL LOW (ref 12.0–15.0)
MCH: 27.6 pg (ref 26.0–34.0)
MCHC: 31.4 g/dL (ref 30.0–36.0)
MCV: 87.9 fL (ref 78.0–100.0)
Platelets: 210 10*3/uL (ref 150–400)
RBC: 3.48 MIL/uL — AB (ref 3.87–5.11)
RDW: 16 % — AB (ref 11.5–15.5)
WBC: 5.5 10*3/uL (ref 4.0–10.5)

## 2015-08-06 LAB — PROTIME-INR
INR: 1.57 — AB (ref 0.00–1.49)
PROTHROMBIN TIME: 18.8 s — AB (ref 11.6–15.2)

## 2015-08-06 LAB — GLUCOSE, CAPILLARY
GLUCOSE-CAPILLARY: 146 mg/dL — AB (ref 65–99)
GLUCOSE-CAPILLARY: 97 mg/dL (ref 65–99)
GLUCOSE-CAPILLARY: 118 mg/dL — AB (ref 65–99)
GLUCOSE-CAPILLARY: 150 mg/dL — AB (ref 65–99)
Glucose-Capillary: 59 mg/dL — ABNORMAL LOW (ref 65–99)
Glucose-Capillary: 176 mg/dL — ABNORMAL HIGH (ref 65–99)

## 2015-08-06 LAB — HEPARIN LEVEL (UNFRACTIONATED): HEPARIN UNFRACTIONATED: 0.26 [IU]/mL — AB (ref 0.30–0.70)

## 2015-08-06 MED ORDER — DM-GUAIFENESIN ER 30-600 MG PO TB12: 1.0000 | ORAL_TABLET | Freq: Two times a day (BID) | ORAL | Status: DC | PRN

## 2015-08-06 MED ORDER — INSULIN ASPART 100 UNIT/ML ~~LOC~~ SOLN
0.0000 [IU] | Freq: Three times a day (TID) | SUBCUTANEOUS | Status: DC
  Administered 2015-08-06: 3 [IU] via SUBCUTANEOUS

## 2015-08-06 MED ORDER — LEVOFLOXACIN IN D5W 500 MG/100ML IV SOLN
500.0000 mg | INTRAVENOUS | Status: DC
  Administered 2015-08-06: 500 mg via INTRAVENOUS
  Filled 2015-08-06: qty 100

## 2015-08-06 MED ORDER — ENOXAPARIN SODIUM 40 MG/0.4ML ~~LOC~~ SOLN
40.0000 mg | Freq: Every day | SUBCUTANEOUS | Status: DC
  Administered 2015-08-07: 40 mg via SUBCUTANEOUS
  Filled 2015-08-06: qty 0.4

## 2015-08-06 MED ORDER — DEXTROSE 50 % IV SOLN
INTRAVENOUS | Status: AC
  Administered 2015-08-06: 50 mL
  Filled 2015-08-06: qty 50

## 2015-08-06 NOTE — Progress Notes (Signed)
ANTICOAGULATION CONSULT NOTE - Follow Up Consult  Pharmacy Consult for heparin Indication: h/o Afib/PE/DVT    Labs:  Recent Labs  08/04/15 0449 08/04/15 0930 08/05/15 0410 08/05/15 0944 08/06/15 0435  HGB 9.6*  --  9.3*  --  9.6*  HCT 31.3*  --  30.2*  --  30.6*  PLT 224  --  217  --  210  LABPROT 23.5*  --  19.6*  --  18.8*  INR 2.11*  --  1.66*  --  1.57*  HEPARINUNFRC 0.62  --  0.42  --  0.26*  CREATININE  --  1.18*  --  1.23*  --      Assessment: 81yo female now subtherapeutic on heparin after one level at goal after rate decrease for level at higher end of goal.  Goal of Therapy:  Heparin level 0.3-0.7 units/ml   Plan:  Will increase heparin gtt slightly to 950 units/hr where pt was previously therapeutic and check level in [place], PharmD, BCPS  08/06/2015,6:05 AM

## 2015-08-06 NOTE — Clinical Social Work Note (Signed)
CSW received referral for Hospice Home placement at time of discharge. CSW confirmed with RN and patient's daughter, family agreeable to Hospice placement. Per patient's daughter, family prefers [facility].  CSW has initiated appropriate referrals to [facility] facilities. Hospice Home facilities continue to review patient referral.  CSW to continue to follow and assist with discharge planning needs.  Lubertha Sayres, [facility]: [phone] Surgical: [phone]

## 2015-08-06 NOTE — Progress Notes (Signed)
Inpatient Diabetes Program Recommendations  AACE/ADA: New Consensus Statement on Inpatient Glycemic Control (2015)  Target Ranges:  Prepandial:   less than 140 mg/dL      Peak postprandial:   less than 180 mg/dL (1-2 hours)      Critically ill patients:  140 - 180 mg/dL   Review of Glycemic Control  Diabetes history: DM 2 Outpatient Diabetes medications: Lantus 20 units Daily Current orders for Inpatient glycemic control: Lantus 10 units QHS, Novolog Resistant Q4hrs  Patient scheduled to get IV Solumedrol 20 mg Q24hrs  Inpatient Diabetes Program Recommendations:  Insulin - Basal: Since patient is scheduled for IV solumedrol today, patient will possibly need to continue with the Lantus 10 units, regardless of the hypoglycemia at 0400 am. Correction (SSI): Due to the hypoglycemia in the 50's around 4am after getting correction at midnight. Patient is eating and was getting correction Q4 hrs. If patient is eating, please adjust Novolog Correction to TID and HS.  Thanks,  Tama Headings RN, MSN, [facility] Inpatient Diabetes Coordinator Team Pager [phone] (8a-5p)

## 2015-08-06 NOTE — Progress Notes (Signed)
Daily Progress Note   Patient Name: Lindsay Sheppard       Date: 08/06/2015 DOB: [birth year]  Age: 81 y.o. MRN#: [id number] Attending Physician: Cherene Altes, MD Primary Care Physician: [name], MD Admit Date: 07/31/2015  Reason for Consultation/Follow-up: Establishing goals of care, Inpatient hospice referral and Psychosocial/spiritual support  Subjective: Lindsay Sheppard is sleeping and appears to be comfortable. Her daughter is at bedside and appreciative of the care her mother has received. She confirms decision for hospice facility and that this has been a very difficult decision for her family. She is tearful thinking that she will pass and says they struggled with the thought that she will not be getting IV antibiotics, IVF, and medications except for comfort but they realize that these interventions are not going to make her better. She is still mostly sleeping and not really eating but a couple bites occasionally. Provided emotional support and reassurance.   Interval Events: Clinical decline; now minimally repsonsive Length of Stay: 6 days  Current Medications: Scheduled Meds:  . antiseptic oral rinse  7 mL Mouth Rinse BID  . benztropine  0.5 mg Oral Daily  . cloNIDine  0.3 mg Transdermal Weekly  . enoxaparin (LOVENOX) injection  40 mg Subcutaneous Daily  . imipenem-cilastatin  250 mg Intravenous 3 times per day  . insulin aspart  0-20 Units Subcutaneous TID WC  . insulin glargine  10 Units Subcutaneous QHS  . labetalol  300 mg Oral BID  . pantoprazole  40 mg Oral Daily  . polyethylene glycol  17 g Oral BID    Continuous Infusions:    PRN Meds: acetaminophen **OR** acetaminophen, dextromethorphan-guaiFENesin, hydrALAZINE, ipratropium-albuterol, RESOURCE THICKENUP  CLEAR  Physical Exam: Physical Exam  Constitutional:  Frail ill appearing elderly woman  HENT:  bruising to face from previous falls  Cardiovascular:  Staying in a-fib with brief nonsustained periods of SVT  Pulmonary/Chest:  increased work of breathing at rest  Neurological:  Minimally responsive to touch and voice tonight  Skin:  cool                Vital Signs: BP 173/64 mmHg  Pulse 79  Temp(Src) 98.3 F (36.8 C) (Oral)  Resp 20  Ht 5\' 5"  (1.651 m)  Wt 63.504 kg (140 lb)  BMI 23.30 kg/m2  SpO2  98% SpO2: SpO2: 98 % O2 Device: O2 Device: Not Delivered O2 Flow Rate:    Intake/output summary:   Intake/Output Summary (Last 24 hours) at 08/06/15 1556 Last data filed at 08/06/15 1509  Gross per 24 hour  Intake    379 ml  Output   1325 ml  Net   -946 ml   LBM: Last BM Date: 08/02/15 Baseline Weight: Weight: 63.504 kg (140 lb) Most recent weight: Weight: 63.504 kg (140 lb)       Palliative Assessment/Data: Flowsheet Rows        Most Recent Value   Intake Tab    Referral Department  Hospitalist   Unit at Time of Referral  Intermediate Care Unit   Palliative Care Primary Diagnosis  Sepsis/Infectious Disease   Date Notified  08/02/15   Palliative Care Type  Return patient Palliative Care   Reason for referral  Clarify Goals of Care, Counsel Regarding Hospice   Date of Admission  07/31/15   Date first seen by Palliative Care  08/03/15   # of days Palliative referral response time  1 Day(s)   # of days IP prior to Palliative referral  2   Clinical Assessment    Palliative Performance Scale Score  20%   Pain Max last 24 hours  Other (Comment)   Pain Min Last 24 hours  Other (Comment)   Dyspnea Max Last 24 Hours  Other (Comment)   Dyspnea Min Last 24 hours  Other (Comment)   Nausea Max Last 24 Hours  Other (Comment)   Nausea Min Last 24 Hours  Other (Comment)   Anxiety Max Last 24 Hours  Other (Comment)   Anxiety Min Last 24 Hours  Other (Comment)   Other Max  Last 24 Hours  Other (Comment)   Psychosocial & Spiritual Assessment    Social Work Plan of Care  Counseling   Palliative Care Outcomes    Patient/Family meeting held?  Yes   Who was at the meeting?  2 daughters   Palliative Care Outcomes  Clarified goals of care, Counseled regarding hospice, Transitioned to hospice   Palliative Care follow-up planned  Yes, Facility      Additional Data Reviewed: CBC    Component Value Date/Time   WBC 5.5 08/06/2015 0435   WBC 5.3 07/16/2015   RBC 3.48* 08/06/2015 0435   HGB 9.6* 08/06/2015 0435   HCT 30.6* 08/06/2015 0435   PLT 210 08/06/2015 0435   MCV 87.9 08/06/2015 0435   MCH 27.6 08/06/2015 0435   MCHC 31.4 08/06/2015 0435   RDW 16.0* 08/06/2015 0435   LYMPHSABS 0.6* 07/31/2015 1248   MONOABS 1.0 07/31/2015 1248   EOSABS 0.1 07/31/2015 1248   BASOSABS 0.0 07/31/2015 1248    CMP     Component Value Date/Time   NA 137 08/05/2015 0944   NA 146 07/16/2015   K 4.7 08/05/2015 0944   CL 109 08/05/2015 0944   CO2 19* 08/05/2015 0944   GLUCOSE 130* 08/05/2015 0944   BUN 43* 08/05/2015 0944   BUN 24* 07/16/2015   CREATININE 1.23* 08/05/2015 0944   CREATININE 1.0 07/16/2015   CALCIUM 9.1 08/05/2015 0944   PROT 4.6* 08/05/2015 0944   ALBUMIN 2.5* 08/05/2015 0944   AST 65* 08/05/2015 0944   ALT 80* 08/05/2015 0944   ALKPHOS 98 08/05/2015 0944   BILITOT 0.9 08/05/2015 0944   GFRNONAA 37* 08/05/2015 0944   GFRAA 43* 08/05/2015 0944       Problem List:  Patient  Active Problem List   Diagnosis Date Noted  . HCAP (healthcare-associated pneumonia)   . Sepsis ([place])   . Abdominal pain, chronic, right lower quadrant   . Chronic pulmonary embolism ([place])   . Aspiration pneumonia of right lower lobe due to vomit ([place])   . Urinary tract infection, site not specified   . Type 2 diabetes mellitus with diabetic neuropathy, without long-term current use of insulin ([place])   . Pulmonary embolism without acute cor pulmonale ([place])   . Sepsis  secondary to UTI ([place]) 07/31/2015  . Myoclonic jerking 07/31/2015  . Chronic indwelling Foley catheter 07/10/2015  . Chronic anticoagulation 07/10/2015  . Acute kidney injury ([place]) 07/10/2015  . Acute encephalopathy   . CAP (community acquired pneumonia) 07/06/2015  . RLQ abdominal pain 07/06/2015  . Lethargy 07/06/2015  . Foley catheter in place on admission 07/06/2015  . Hypertensive emergency 07/06/2015  . Cloudy urine 04/19/2015  . Abnormal breath sounds 04/19/2015  . Non-healing skin lesion of nose 12/05/2014  . Peripheral edema 12/05/2014  . Urinary tract infectious disease   . Palliative care encounter   . Loss of weight   . Flank pain   . Abdominal pain 09/05/2014  . Colonic mass 09/05/2014  . UTI (lower urinary tract infection) 09/05/2014  . Urinary tract infection 09/04/2014  . Accelerated hypertension 08/27/2014  . Chronic atrial fibrillation ([place]) 08/27/2014  . Diabetes mellitus type 2, controlled ([place]) 08/27/2014  . Dementia 08/08/2014  . Nocturia 08/08/2014  . Malignant hypertension 03/19/2014  . Protein-calorie malnutrition, severe ([place]) 03/16/2014  . Stroke-like episode ([place]) 03/15/2014  . 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 Supratherapeutic INR 02/09/2014  . Recurrent UTI 02/09/2014  . Fractures 02/09/2014  . Chronic constipation 02/08/2014  . Fall 02/07/2014  . Compression fracture 02/07/2014  . Syncope 02/07/2014  . Encounter for therapeutic drug monitoring 09/27/2013  . Foot pain 09/27/2013  . Rectal pain 09/27/2013  . Hearing loss, right 09/27/2013  . Left foot pain 05/12/2013  . Left shoulder pain 03/22/2013  . Abdominal pain, other specified site 06/23/2012  . Fever 03/24/2012  . Lumbar back pain 03/10/2012  . Gait disorder 03/10/2012  . Fatigue 09/04/2011  . 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 Preventative health care 11/29/2010  . Pulmonary embolism ([place]) 10/14/2010  . Hearing loss 05/30/2010  . DIASTOLIC HEART FAILURE,  CHRONIC 12/14/2009  . POSTHERPETIC NEURALGIA 11/02/2009  . ALLERGIC RHINITIS 11/02/2009  . RENAL INSUFFICIENCY 07/02/2009  . POSTURAL HYPOTENSION 02/07/2008  . SYNCOPE 02/07/2008  . DEPRESSIVE DISORDER 01/25/2008  . CONSTIPATION 01/25/2008  . Diabetes ([place]) 09/06/2007  . RENAL CYST, LEFT 09/06/2007  . B12 deficiency 05/23/2007  . Hyperlipidemia 05/23/2007  . Atrial fibrillation ([place]) 05/23/2007  . OSTEOPOROSIS 05/23/2007  . SHINGLES, HX OF 05/23/2007  . ANEMIA-IRON DEFICIENCY 01/26/2007  . Essential hypertension 01/26/2007  . GERD 01/26/2007     Palliative Care Assessment & Plan    1.Code Status:  DNR    Code Status Orders        Start     Ordered   07/31/15 1657  Do not attempt resuscitation (DNR)   Continuous    Question Answer Comment  In the event of cardiac or respiratory ARREST Do not call a "code blue"   In the event of cardiac or respiratory ARREST Do not perform Intubation, CPR, defibrillation or ACLS   In the event of cardiac or respiratory ARREST Use medication by any route, position, wound care, and other measures to relive pain  and suffering. May use oxygen, suction and manual treatment of airway obstruction as needed for comfort.      07/31/15 1700    Advance Directive Documentation        Most Recent Value   Type of Advance Directive  Healthcare Power of [place], Living will [Bonnie B Baity daughter,Sharon B May Daughter]   Pre-existing out of facility DNR order (yellow form or pink MOST form)     "MOST" Form in Place?         2. Goals of Care/Additional Recommendations:  Hopeful for in-patient hospice for EOL care  Continue current anti biotics, treatment until dc. Understand that hospice cannot provide IV antibiotics or IV fluids  Limitations on Scope of Treatment: Avoid Hospitalization, No Artificial Feeding, No Diagnostics, No Surgical Procedures and No Tracheostomy  Desire for further Chaplaincy support:no  Psycho-social Needs:  Grief/Bereavement Support  3. Symptom Management:      1.Pain: Will add low dose morphine concentrate 2.5-5mg  q3 prn. Will also leave iv order for acute symptom mgt       2. Dyspnea: Continue with 02 via [place]. Do not advance to venti or facemask uness family requests. No further           BIPAP.   4. Palliative Prophylaxis:   Aspiration, Bowel Regimen, Delirium Protocol, Frequent Pain Assessment, Oral Care and Turn Reposition  5. Prognosis: < 4 weeks in setting of progressing sepsis despite antibiotics as well as almost continuous a-fib with SVT, dCHF, colon mass suspicious for malignancy, occluded vertebral artery  6. Discharge Planning:  [facility] for rehab with Palliative care service follow-up   Thank you for allowing the Palliative Medicine Team to assist in the care of this patient.   Time In: 1330 Time Out: 1400 Total Time 30 min Prolonged Time Billed  no        Pershing Proud, NP  [id number], 3:56 PM  Please contact Palliative Medicine Team phone at [phone] for questions and concerns.

## 2015-08-06 NOTE — Progress Notes (Signed)
Hypoglycemic Event  CBG: 59  Treatment: D50 IV 50 mL  Symptoms: None  Follow-up CBG: [id number] CBG Result:146  Possible Reasons for Event: Medication regimen: lantus  Comments/MD notified:yes    Kathlen Mody, Conni Elliot

## 2015-08-06 NOTE — Progress Notes (Signed)
[facility] TEAM 1 - Stepdown/ICU TEAM PROGRESS NOTE  ALJAWHARA GRIEBEL [id number] DOB: 1921/06/27 DOA: 07/31/2015 PCP: [name], MD  Admit HPI / Brief Narrative: 81 y.o. F Hx Depression, DM 2, HTN, Chronic Atrial Fibrillation, Chronic Diastolic CHF, Syncope, DVT/PE on chronic Coumadin, Compression Fx L-spine/T-spine, Renal Insufficiency, Lt Renal Cyst, Nephrolithiasis, and a recent urinary tract infection who presented to the ED for the second time in a week with confusion and lethargy. On 10/30 the patient was seen in the ED for a UTI. She was prescribed Keflex and discharged home. She returned lethargic and confused and unable to give a history. Per her daughter for 48-72 hours the patient had nausea and dry heaving and complained of right lower quadrant abdominal pain. The patient has a chronic Foley catheter that was changed last on October 7 (prior to the discovery of her urinary tract infection).   In the emergency department her blood pressure was 225/87 - labs were otherwise unrevealing.  CT head was negative for acute stroke or bleed.  Chest x-ray showed left base opacity.  HPI/Subjective: The patient is alert and conversant.  She is resting comfortably in bed.  She denies chest pain shortness breath fevers chills nausea vomiting or abdominal pain.  Assessment/Plan:  Hypertensive emergency -BP control improved at this time - would not attempt to control further in this elderly individual   Chronic atrial fibrillation -No longer a candidate for systemic anticoagulation - plan is to focus on comfort care at this time  Community acquired pneumonia versus Aspiration pneumonia -today completes 7 days of empiric abx tx for this indication   -dysphagia 1 nectar thick liquid diet per SLP  Confusion and lethargy -Most likely multifactorial to include HTN, pneumonia, UTI, Dementia  -CT head negative - neuro exam nonfocal  Abdominal pain - Right lower quadrant pain -Uncertain  etiology -Patient has a 5.8x4.6 cm mass in her right colon found on CT scan in 09/2014. Family opted not to work this up - likely cancerous.  -does not appear uncomfortable at this time   Enterobacter UTI -Resistant to Zosyn - continue Imipenem to complete 7 days of tx, or utnil d/c to [facility], whichever comes first    Diabetes mellitus with neuropathy -CBG currently well controlled - goal at this time is to simply avoid extremes   History of DVT and PE -no longer a candidate for full anticoag - focus on comfort care   Code Status: NO CODE Family Communication: I spoke with the patient's daughter at bedside at length Disposition Plan: Goal is for discharge to a hospice house - anticipate probable discharge 12/6  Consultants: Palliative Care  Procedures: 11/29 MRI brain - No acute infarct or acute intracranial abnormality - Chronic distal left vertebral artery occlusion  Antibiotics: Zosyn 11/29 > 11/30 Imipenem 12/01 >  DVT prophylaxis: lovenox   Objective: Blood pressure 163/74, pulse 75, temperature 97.9 F (36.6 C), temperature source Oral, resp. rate 20, height 5\' 5"  (1.651 m), weight 63.504 kg (140 lb), SpO2 100 %.  Intake/Output Summary (Last 24 hours) at 08/06/15 1259 Last data filed at 08/06/15 1252  Gross per 24 hour  Intake    479 ml  Output   1125 ml  Net   -646 ml   Exam: General: No acute respiratory distress Lungs: poor air movement in B bases - no wheeze  Cardiovascular: Regular rate without gallop or rub normal S1 and S2 Abdomen: Nontender, nondistended, soft, bowel sounds positive, no rebound, no ascites,  no appreciable mass Extremities: No significant cyanosis, or clubbing - trace edema bilateral lower extremities  Data Reviewed: Basic Metabolic Panel:  Recent Labs Lab 08/01/15 0507 08/02/15 0600 08/03/15 0343 08/04/15 0930 08/05/15 0944  NA 139 141 140 138 137  K 3.9 3.7 3.8 3.7 4.7  CL 105 107 107 109 109  CO2 27 25 23  21* 19*    GLUCOSE 83 136* 217* 163* 130*  BUN 22* 25* 30* 33* 43*  CREATININE 1.20* 1.14* 1.15* 1.18* 1.23*  CALCIUM 9.4 9.0 9.1 9.4 9.1  MG  --   --   --  2.0 1.8    CBC:  Recent Labs Lab 07/31/15 1248  08/02/15 0600 08/03/15 0343 08/04/15 0449 08/05/15 0410 08/06/15 0435  WBC 10.6*  < > 4.8 3.7* 4.1 4.0 5.5  NEUTROABS 8.9*  --   --   --   --   --   --   HGB 11.1*  < > 10.3* 10.2* 9.6* 9.3* 9.6*  HCT 35.6*  < > 32.7* 32.6* 31.3* 30.2* 30.6*  MCV 88.3  < > 87.7 88.3 88.2 87.8 87.9  PLT 169  < > 185 194 224 217 210  < > = values in this interval not displayed.  Liver Function Tests:  Recent Labs Lab 07/31/15 1248 08/02/15 0600 08/03/15 0343 08/04/15 0930 08/05/15 0944  AST 22 99* 79* 88* 65*  ALT 29 81* 82* 94* 80*  ALKPHOS 74 156* 143* 118 98  BILITOT 0.7 0.5 0.7 0.5 0.9  PROT 5.9* 5.2* 5.3* 5.1* 4.6*  ALBUMIN 3.3* 2.4* 2.4* 2.6* 2.5*    Recent Labs Lab 07/31/15 1537  AMMONIA 11    Coags:  Recent Labs Lab 08/01/15 0507 08/02/15 0600 08/04/15 0449 08/05/15 0410 08/06/15 0435  INR 2.12* 1.99* 2.11* 1.66* 1.57*    Recent Labs Lab 07/31/15 1536 07/31/15 1719  APTT 48* 46*   CBG:  Recent Labs Lab 08/05/15 2140 08/05/15 2305 08/06/15 0332 08/06/15 0427 08/06/15 0803  GLUCAP 170* 150* 59* 146* 97    Recent Results (from the past 240 hour(s))  Urine culture     Status: None   Collection Time: 07/31/15  3:15 PM  Result Value Ref Range Status   Specimen Description URINE, RANDOM  Final   Special Requests NONE  Final   Culture >=100,000 COLONIES/mL ENTEROBACTER AEROGENES  Final   Report Status 08/02/2015 FINAL  Final   Organism ID, Bacteria ENTEROBACTER AEROGENES  Final      Susceptibility   Enterobacter aerogenes - MIC*    CEFAZOLIN >=64 RESISTANT Resistant     CEFTRIAXONE 16 INTERMEDIATE Intermediate     CIPROFLOXACIN <=0.25 SENSITIVE Sensitive     GENTAMICIN <=1 SENSITIVE Sensitive     IMIPENEM 1 SENSITIVE Sensitive     NITROFURANTOIN <=16  SENSITIVE Sensitive     TRIMETH/SULFA <=20 SENSITIVE Sensitive     PIP/TAZO >=128 RESISTANT Resistant     * >=100,000 COLONIES/mL ENTEROBACTER AEROGENES  Blood culture (routine x 2)     Status: None   Collection Time: 07/31/15  3:50 PM  Result Value Ref Range Status   Specimen Description BLOOD RIGHT ARM  Final   Special Requests BOTTLES DRAWN AEROBIC AND ANAEROBIC 5CC  Final   Culture NO GROWTH 5 DAYS  Final   Report Status 08/05/2015 FINAL  Final  Blood culture (routine x 2)     Status: None   Collection Time: 07/31/15  3:55 PM  Result Value Ref Range Status   Specimen Description  BLOOD RIGHT HAND  Final   Special Requests BOTTLES DRAWN AEROBIC ONLY [place]  Final   Culture NO GROWTH 5 DAYS  Final   Report Status 08/05/2015 FINAL  Final  MRSA PCR Screening     Status: None   Collection Time: 07/31/15  8:52 PM  Result Value Ref Range Status   MRSA by PCR NEGATIVE NEGATIVE Final    Comment:        The GeneXpert MRSA Assay (FDA approved for NASAL specimens only), is one component of a comprehensive MRSA colonization surveillance program. It is not intended to diagnose MRSA infection nor to guide or monitor treatment for MRSA infections.      Studies:   Recent x-ray studies have been reviewed in detail by the Attending Physician  Scheduled Meds:  Scheduled Meds: . antiseptic oral rinse  7 mL Mouth Rinse BID  . benztropine  0.5 mg Oral Daily  . cloNIDine  0.3 mg Transdermal Weekly  . dextromethorphan-guaiFENesin  1 tablet Oral BID  . imipenem-cilastatin  250 mg Intravenous 3 times per day  . insulin aspart  0-20 Units Subcutaneous 6 times per day  . insulin glargine  10 Units Subcutaneous QHS  . labetalol  300 mg Oral BID  . methylPREDNISolone (SOLU-MEDROL) injection  20 mg Intravenous Q24H  . pantoprazole  40 mg Oral Daily  . polyethylene glycol  17 g Oral BID    Time spent on care of this patient: 35 mins   Victor Granados T , MD   [facility] Office   [phone] Pager - Text Page per Shea Evans as per below:  On-Call/Text Page:      [email or web address]      password [email or web address]  If 7PM-7AM, please contact night-coverage [email or web address] Password [email or web address] 08/06/2015, 12:59 PM   LOS: 6 days

## 2015-08-07 DIAGNOSIS — I161 Hypertensive emergency: Secondary | ICD-10-CM

## 2015-08-07 LAB — GLUCOSE, CAPILLARY: Glucose-Capillary: 162 mg/dL — ABNORMAL HIGH (ref 65–99)

## 2015-08-07 LAB — LEGIONELLA PNEUMOPHILA SEROGP 1 UR AG: L. PNEUMOPHILA SEROGP 1 UR AG: POSITIVE — AB

## 2015-08-07 MED ORDER — LEVOFLOXACIN 500 MG PO TABS
500.0000 mg | ORAL_TABLET | ORAL | Status: DC
  Filled 2015-08-07: qty 1

## 2015-08-07 MED ORDER — INSULIN GLARGINE 100 UNIT/ML ~~LOC~~ SOLN: 10.0000 [IU] | Freq: Every day | SUBCUTANEOUS | Status: AC

## 2015-08-07 MED ORDER — RESOURCE THICKENUP CLEAR PO POWD: 2.0000 | ORAL | Status: AC | PRN

## 2015-08-07 MED ORDER — DM-GUAIFENESIN ER 30-600 MG PO TB12: 1.0000 | ORAL_TABLET | Freq: Two times a day (BID) | ORAL | Status: AC | PRN

## 2015-08-07 MED ORDER — INSULIN ASPART 100 UNIT/ML ~~LOC~~ SOLN: 0.0000 [IU] | Freq: Three times a day (TID) | SUBCUTANEOUS | Status: AC

## 2015-08-07 MED ORDER — IPRATROPIUM-ALBUTEROL 0.5-2.5 (3) MG/3ML IN SOLN: 3.0000 mL | Freq: Four times a day (QID) | RESPIRATORY_TRACT | Status: AC | PRN

## 2015-08-07 MED ORDER — LABETALOL HCL 300 MG PO TABS: 300.0000 mg | ORAL_TABLET | Freq: Two times a day (BID) | ORAL | Status: AC

## 2015-08-07 MED ORDER — HYDRALAZINE HCL 20 MG/ML IJ SOLN: 5.0000 mg | INTRAMUSCULAR | Status: AC | PRN

## 2015-08-07 NOTE — Clinical Social Work Note (Addendum)
Patient will discharge to [facility] today.  Discharge information transmitted to facility and family at the bedside and aware of discharge.   Wisdom Seybold Givens, MSW, LCSW Licensed Clinical Social Worker [place] [phone]

## 2015-08-07 NOTE — Care Management Important Message (Signed)
Important Message  Patient Details  Name: Lindsay Sheppard MRN: [id number] Date of Birth: 03/01/1921   Medicare Important Message Given:  Yes    [name] 08/07/2015, 11:22 AM

## 2015-08-07 NOTE — Discharge Summary (Signed)
Physician Discharge Summary  Lindsay BERRETTA [id number] DOB: [birth year] DOA: 07/31/2015  PCP: [name], MD  Admit date: 07/31/2015 Discharge date: 08/07/2015  Time spent: 35 minutes  Recommendations for Outpatient Follow-up:  Hypertensive emergency -Continue Labetalol 300 mg BID -Clonidine, 0.3 mg transdermal -Hydralazine IV 5 mg PRN SBP>160 or DBP> 100  Chronic atrial fibrillation -No longer a candidate for systemic anticoagulation - plan is to focus on comfort care at this time  Community acquired pneumonia versus Aspiration pneumonia -Imipenem per pharmacy; will cover both aspiration pneumonia and her Enterobacter Aerogenes UTI ; completed 7 days of treatment. -Speech therapy; recommends dysphagia 1 nectar thick liquid  -Mucinex DM BID  -DuoNeb QID PRN -Comfort care   Legionella pneumonia (Legionella urine antigen positive) - Levofloxacin started on 12/5 would complete 7 day course -ADDENDUM; although patient had received IV levofloxacin, patient's family concerned she has an allergy and has requested PO levofloxacin be discontinued. -Comfort care  Confusion and lethargy -Most likely multifactorial to include HTN, pneumonia, UTI, Dementia  -CT head is negative. Neuro exam is nonfocal. Will treat underlying problems. -Continue waxing and waning confusion. Continue increasing lethargy  Abdominal pain Right lower quadrant pain -Uncertain etiology.  -Patient has a 5.8x4.6 cm mass in her right colon found on CT scan in 09/2014. Family has opted not to work this up most likely cancerous.   Urinary tract infection positive ENTEROBACTER AEROGENES -Course of imipenem complete   -Change Foley catheter. This catheter was last changed on 10/7.  Diabetes mellitus with neuropathy -Continue Resistant SSI -Continue Lantus 10 units QHS  History of DVT and PE -Comfort care    Discharge Diagnoses:  Principal Problem:   Sepsis secondary to UTI [facility]) Active Problems:    B12 deficiency   Hyperlipidemia   Hearing loss   Essential hypertension   Atrial fibrillation (HCC)   DIASTOLIC HEART FAILURE, CHRONIC   Protein-calorie malnutrition, severe (HCC)   Dementia   Diabetes mellitus type 2, controlled ([place])   Acute encephalopathy   Myoclonic jerking   Aspiration pneumonia of right lower lobe due to vomit (HCC)   Urinary tract infection, site not specified   Type 2 diabetes mellitus with diabetic neuropathy, without long-term current use of insulin (HCC)   Pulmonary embolism without acute cor pulmonale (HCC)   Abdominal pain, chronic, right lower quadrant   Chronic pulmonary embolism ([place])   HCAP (healthcare-associated pneumonia)   Sepsis ([place])   Discharge Condition: Guarded   Diet ; Dysphagia 1 nectar thick  Filed Weights   07/31/15 1224  Weight: 63.504 kg (140 lb)    History of present illness:  81 y.o. WF PMHx Depressive DO, DM Type 2 with neuropathy, HTN, Chronic Atrial Fibrillation, Chronic Diastolic CHF, Syncope, DVT/PE on chronic Coumadin, Compression Fx L-spine/T-spine, Renal Insufficiency, Lt Renal Cyst, Nephrolithiasis, recent urinary tract infection,  Presents to the emergency department for the second time this week with confusion and lethargy. On Sunday, 10/30 the patient was seen in the emergency department for UTI. She was prescribed Keflex and discharged home. She returns today lethargic and confused unable to give her an history. History was collected from her daughter at bedside. Per her daughter for the last 48-72 hours the patient has had nausea and dry heaving, she has complained of right lower quadrant abdominal pain. Normally the patient gets up with the assistance of her daughters in the morning but this morning she was too lethargic to do so. Her lethargy and confusion prompted the daughters to bring her  back to the hospital. The patient has a chronic Foley catheter that was changed last on October 7 (prior to the discovery of  her urinary tract infection). Reportedly the patient's bowel movements have been normal. She has had a lot of dry heaving but no emesis. She has been complaining congestion in the back of her throat.  In the emergency department her blood pressure is 225/87, labs are otherwise unrevealing, CT head is negative for acute stroke or bleed, chest x-ray shows left base opacity.  Patient is lethargic and hard of hearing and unable to give review of systems.   Consultants: NPSarah Hughes [facility] palliative care  Procedure/Significant Events: 11/29 MRI brain;I No acute infarct or acute intracranial abnormality -Chronic distal left vertebral artery occlusion.     Culture 11/29 urine positive ENTEROBACTER AEROGENES 11/29 blood right arm/hand NGTD 12 /1 Influenza A/B/H1 N1 negative 12/1Strep pneumo urine antigen negative 12/1Legionella urine antigen not drawn 12/1Sputum culture not drawn 12/2 Legionella urine antigen positive   Antibiotics: Azithromycin Ceftriaxone Zosyn 11/29>> 12/1 Imipenem 12/1>> Levofloxacin 12/5>>   Discharge Exam: Filed Vitals:   08/07/15 0327 08/07/15 0330 08/07/15 0755 08/07/15 1140  BP:  154/92 162/78   Pulse:  78    Temp: 98 F (36.7 C)  97.4 F (36.3 C) 98.3 F (36.8 C)  TempSrc: Axillary  Oral Oral  Resp:  26    Height:      Weight:      SpO2:  100%      General: A/O 3(did not know why),, awake visiting with family, No acute respiratory distress Eyes: Negative headache, eye pain, double vision,negative scleral hemorrhage ENT: Negative Runny nose, negative ear pain, negative gingival bleeding, Neck: Negative scars, masses, torticollis, lymphadenopathy, JVD Lungs: Clear to auscultation bilaterally without wheezes or crackles Cardiovascular: Irregular irregular rhythm and rate, without murmur gallop or rub normal S1 and S2     Discharge Instructions     Medication List    STOP taking these medications        AMBULATORY NON FORMULARY  MEDICATION     CRANBERRY PO     cyanocobalamin 1000 MCG/ML injection  Commonly known as:  (VITAMIN B-12)     ferrous sulfate 325 (65 FE) MG tablet     furosemide 20 MG tablet  Commonly known as:  LASIX     hydrALAZINE 25 MG tablet  Commonly known as:  APRESOLINE  Replaced by:  hydrALAZINE 20 MG/ML injection     hydroxypropyl methylcellulose / hypromellose 2.5 % ophthalmic solution  Commonly known as:  ISOPTO TEARS / GONIOVISC     pravastatin 20 MG tablet  Commonly known as:  PRAVACHOL     TUBERSOL 5 UNIT/0.1ML injection  Generic drug:  tuberculin     warfarin 1 MG tablet  Commonly known as:  COUMADIN     warfarin 2 MG tablet  Commonly known as:  COUMADIN      TAKE these medications        acetaminophen 325 MG tablet  Commonly known as:  TYLENOL  Take 2 tablets (650 mg total) by mouth 3 (three) times daily.     benztropine 0.5 MG tablet  Commonly known as:  COGENTIN  Take 0.5 mg by mouth daily. For involuntary jerks     cloNIDine 0.3 mg/24hr patch  Commonly known as:  CATAPRES - Dosed in mg/24 hr  Place 0.3 mg onto the skin once a week. Wednesdays     dextromethorphan-guaiFENesin 30-600 MG 12hr tablet  Commonly known  as:  MUCINEX DM  Take 1 tablet by mouth 2 (two) times daily as needed for cough.     hydrALAZINE 20 MG/ML injection  Commonly known as:  APRESOLINE  Inject 0.25 mLs (5 mg total) into the vein every 4 (four) hours as needed (SBP>160 or DBP> 100).     insulin aspart 100 UNIT/ML injection  Commonly known as:  novoLOG  Inject 0-20 Units into the skin 3 (three) times daily with meals.     insulin glargine 100 UNIT/ML injection  Commonly known as:  LANTUS  Inject 0.1 mLs (10 Units total) into the skin at bedtime.     ipratropium-albuterol 0.5-2.5 (3) MG/3ML Soln  Commonly known as:  DUONEB  Take 3 mLs by nebulization every 6 (six) hours as needed.     labetalol 300 MG tablet  Commonly known as:  NORMODYNE  Take 1 tablet (300 mg total) by mouth  2 (two) times daily.     pantoprazole 40 MG tablet  Commonly known as:  PROTONIX  TAKE 1 BY MOUTH DAILY     polyethylene glycol packet  Commonly known as:  MIRALAX / GLYCOLAX  Take 17 g by mouth 2 (two) times daily.     RESOURCE THICKENUP CLEAR Powd  Take 240 g by mouth as needed (Follow directions on can to thicken fluid to appropriate thickness.).       Allergies  Allergen Reactions  . Amiodarone Other (See Comments)    REACTION: f? fluid retention  . Deltasone [Prednisone] Other (See Comments)    unknown  . Diltiazem Hcl Other (See Comments)    unknown  . Levofloxacin Nausea And Vomiting    REACTION: nausea  . Pregabalin Other (See Comments)    REACTION: hallucinations  . Spironolactone Other (See Comments)    REACTION: elevated K at lowest dose  . Tequin [Gatifloxacin] Other (See Comments)    Hypoglycemia  . Diazepam Anxiety and Other (See Comments)    REACTION: agitation      The results of significant diagnostics from this hospitalization (including imaging, microbiology, ancillary and laboratory) are listed below for reference.    Significant Diagnostic Studies: Ct Head Wo Contrast  07/31/2015  CLINICAL DATA:  Altered mental status; lethargic EXAM: CT HEAD WITHOUT CONTRAST TECHNIQUE: Contiguous axial images were obtained from the base of the skull through the vertex without intravenous contrast. COMPARISON:  July 06, 2015 FINDINGS: Mild diffuse atrophy is stable. There is no intracranial mass, hemorrhage, extra-axial fluid collection, or midline shift. There is rather minimal small vessel disease in the centra semiovale bilaterally. Elsewhere gray-white compartments appear normal. No acute infarct is evident. Bony calvarium appears intact. The mastoid air cells are clear. There are no intraorbital lesions appreciable. IMPRESSION: Mild atrophy with rather minimal periventricular small vessel disease, stable. No acute infarct evident. No hemorrhage or mass effect.  Electronically Signed   By: Lowella Grip III M.D.   On: 07/31/2015 14:04   Mr Brain Wo Contrast  07/31/2015  CLINICAL DATA:  81 year old female with worsening confusion since yesterday. Acute encephalopathy. Myoclonic jerking. Initial encounter. EXAM: MRI HEAD WITHOUT CONTRAST MRA HEAD WITHOUT CONTRAST TECHNIQUE: Multiplanar, multiecho pulse sequences of the brain and surrounding structures were obtained without intravenous contrast. Angiographic images of the head were obtained using MRA technique without contrast. COMPARISON:  Head CT without contrast 1354 hours today and earlier. Brain MRI 03/21/2014, and 11/15/2004. FINDINGS: MRI HEAD FINDINGS Cerebral volume is stable. Major intracranial vascular flow voids are stable, with chronic absence of  the distal left vertebral artery flow void (see also 11/15/2004 series 3, image 4). No restricted diffusion to suggest acute infarction. No midline shift, mass effect, evidence of mass lesion, ventriculomegaly, extra-axial collection or acute intracranial hemorrhage. Cervicomedullary junction and pituitary are within normal limits. [place] Cables and white matter signal throughout the brain appears stable and normal for age. Axial T1 weighted images are severely degraded by motion despite repeated attempts. Trace mastoid fluid is unchanged. Grossly negative visualized internal auditory structures. Trace paranasal sinus mucosal thickening is stable. Orbit and scalp soft tissues are stable. Negative visualized cervical spine. Normal bone marrow signal. MRA HEAD FINDINGS Loss of flow signal in the distal left vertebral artery. Antegrade flow in the distal right vertebral artery. Right PICA appears patent. Patent basilar artery with up to mild stenosis distally. Bilateral AICA and SCA origins are patent. Fetal type bilateral PCA origins. Bilateral PCA branches are within normal limits for age. Antegrade flow in both ICA siphons. Mild to moderate siphon dolichoectasia with no  siphon stenosis. Ophthalmic and posterior communicating artery origins appear normal. Patent carotid termini. Normal MCA and ACA origins. Anterior communicating artery and visualized bilateral ACA branches are within normal limits. The left MCA bifurcation is a ectatic without discrete saccular aneurysm. Otherwise the visualized bilateral MCA branches are within normal limits. IMPRESSION: 1. Intermittently motion degraded despite repeated imaging attempts. 2.  No acute infarct or acute intracranial abnormality identified. 3. Chronic distal left vertebral artery occlusion. Otherwise negative for age intracranial MRA. Electronically Signed   By: Genevie Ann M.D.   On: 07/31/2015 19:24   Dg Chest Port 1 View  07/31/2015  CLINICAL DATA:  81 year old presenting with acute mental status changes and lethargy. EXAM: PORTABLE CHEST 1 VIEW COMPARISON:  07/06/2015 and earlier. FINDINGS: Airspace consolidation in the left lower lobe with silhouetting the left hemidiaphragm, similar in appearance to the most recent examination 3+ weeks ago. No new pulmonary parenchymal abnormalities elsewhere. Cardiac silhouette mildly to moderately enlarged, unchanged. Mild pulmonary venous hypertension without overt edema. IMPRESSION: 1. Left lower lobe atelectasis and/or pneumonia, not significantly changed since 07/06/2015. 2. No new abnormalities. Electronically Signed   By: Evangeline Dakin M.D.   On: 07/31/2015 13:20   Dg Swallowing Func-speech Pathology  07/09/2015  Objective Swallowing Evaluation:   MBS Patient Details Name: TASHENNA WELLBAUM MRN: [id number] Date of Birth: 12-22-1920 Today's Date: 07/09/2015 Time: SLP Start Time (ACUTE ONLY): 1350-SLP Stop Time (ACUTE ONLY): 1410 SLP Time Calculation (min) (ACUTE ONLY): 20 min Past Medical History: Past Medical History Diagnosis Date . POSTHERPETIC NEURALGIA 11/02/2009 . B12 DEFICIENCY 05/23/2007 . HYPERLIPIDEMIA 05/23/2007 . HYPERKALEMIA 05/30/2010 . ANEMIA-IRON DEFICIENCY 01/26/2007 .  BLEPHARITIS, BILATERAL 02/23/2008 . Other specified forms of hearing loss 05/30/2010 . HYPERTENSION 01/26/2007 . Atrial fibrillation ([place]) 05/23/2007 . DIASTOLIC HEART FAILURE, CHRONIC 12/14/2009 . POSTURAL HYPOTENSION 02/07/2008 . ALLERGIC RHINITIS 11/02/2009 . GERD 01/26/2007 . CONSTIPATION 01/25/2008 . SKIN LESION 05/30/2010 . BACK PAIN 08/02/2007 . OSTEOPOROSIS 05/23/2007 . SYNCOPE 02/07/2008 . FATIGUE 09/06/2007 . Dysuria 09/05/2008 . Abdominal pain, epigastric 09/06/2007 . EPIGASTRIC TENDERNESS 10/12/2007 . PULMONARY EMBOLISM, HX OF 05/23/2007 . SHINGLES, HX OF 05/23/2007 . 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 Left foot pain  . Diabetic neuropathy ([place])  . RENAL INSUFFICIENCY 07/02/2009 . RENAL CYST, LEFT 09/06/2007 . Kidney stones    "never had OR" . Family history of anesthesia complication    "daughter has PONV; another daughter a little anesthesia lasts way too long" . CHF (congestive heart failure) ([place]) 2011 . Myocardial infarction [facility]) 2011   "  mild" . DVT (deep venous thrombosis) ([place]) ~ 1964   "think it was in the left" . Aspiration pneumonia ([place]) ~ 2006   "due to aspiration" . DIABETES MELLITUS, TYPE II 09/06/2007 . DEPRESSIVE DISORDER 01/25/2008   "@ times; never treated for it" . Melanoma of nose ([place])  . Compression fracture of lumbar vertebra (HCC)    hx . Compression fracture of thoracic vertebra ([place]) 02/07/2014   "fell this am" (02/07/2014 . Arthritis    "hands" (03/15/2014) . Foley catheter in place on admission  Past Surgical History: Past Surgical History Procedure Laterality Date . Appendectomy  1943 . Abdominal hysterectomy  1964 . Cardiac electrophysiology mapping and ablation  1999 . Cataract extraction w/ intraocular lens  implant, bilateral  2003-2005 . Carpal tunnel release Right 2004 . Mohs surgery  2011   "removed off her nose" . Cardiac catheterization  10/2009 . Esophageal dilation  X 2 HPI: Other Pertinent Information: Pt is a 81 year old female with complex PMH including dementia, recently diagnosed with UTI and  treated with keflex 5 days ago, who presents with lethargy and increased confusion per the patient's family. CT of head negative for acute intracranial abnormalities. CT of abdomen significant for patchy left lung base opacity.  No Data Recorded Assessment / Plan / Recommendation CHL IP CLINICAL IMPRESSIONS 07/09/2015 Therapy Diagnosis (None) Clinical Impression Decreased labial seal and control resulted in right anterior spillage; prolonged manipulation and transit with cracker (no upper dentition).  Mild motor based pharyngeal dysphagia, decreased laryngeal closure with silent penetration with nectar x 1 and silent aspiration with thin (chin tuck strategy ineffective). Mild vallecular and pyriform sinus residue reduced with verbal cues for second swallow. Decreased oral control if larger sip consumed increases aspiration risk. Recommend Dys 2 diet and nectar thick liquids, no straws and sit upright.      CHL IP TREATMENT RECOMMENDATION 07/07/2015 Treatment Recommendations Therapy as outlined in treatment plan below   CHL IP DIET RECOMMENDATION 07/09/2015 SLP Diet Recommendations (None) Liquid Administration via (None) Medication Administration Crushed with puree Compensations Slow rate;Small sips/bites Postural Changes and/or Swallow Maneuvers Seated upright 90 degrees       CHL IP FREQUENCY AND DURATION 07/07/2015 Speech Therapy Frequency (ACUTE ONLY) min 2x/week Treatment Duration 2 weeks   Pertinent Vitals/Pain none  SLP Swallow Goals No flowsheet data found. No flowsheet data found.   CHL IP REASON FOR REFERRAL 07/09/2015 Reason for Referral Objectively evaluate swallowing function       No flowsheet data found.   Houston Siren 07/09/2015, 3:17 PM Orbie Pyo Colvin Caroli.Ed CCC-SLP Pager [phone]   Mr Jodene Nam Head/brain Wo Cm  07/31/2015  CLINICAL DATA:  81 year old female with worsening confusion since yesterday. Acute encephalopathy. Myoclonic jerking. Initial encounter. EXAM: MRI HEAD WITHOUT CONTRAST MRA  HEAD WITHOUT CONTRAST TECHNIQUE: Multiplanar, multiecho pulse sequences of the brain and surrounding structures were obtained without intravenous contrast. Angiographic images of the head were obtained using MRA technique without contrast. COMPARISON:  Head CT without contrast 1354 hours today and earlier. Brain MRI 03/21/2014, and 11/15/2004. FINDINGS: MRI HEAD FINDINGS Cerebral volume is stable. Major intracranial vascular flow voids are stable, with chronic absence of the distal left vertebral artery flow void (see also 11/15/2004 series 3, image 4). No restricted diffusion to suggest acute infarction. No midline shift, mass effect, evidence of mass lesion, ventriculomegaly, extra-axial collection or acute intracranial hemorrhage. Cervicomedullary junction and pituitary are within normal limits. [place] Cables and white matter signal throughout the brain appears stable and  normal for age. Axial T1 weighted images are severely degraded by motion despite repeated attempts. Trace mastoid fluid is unchanged. Grossly negative visualized internal auditory structures. Trace paranasal sinus mucosal thickening is stable. Orbit and scalp soft tissues are stable. Negative visualized cervical spine. Normal bone marrow signal. MRA HEAD FINDINGS Loss of flow signal in the distal left vertebral artery. Antegrade flow in the distal right vertebral artery. Right PICA appears patent. Patent basilar artery with up to mild stenosis distally. Bilateral AICA and SCA origins are patent. Fetal type bilateral PCA origins. Bilateral PCA branches are within normal limits for age. Antegrade flow in both ICA siphons. Mild to moderate siphon dolichoectasia with no siphon stenosis. Ophthalmic and posterior communicating artery origins appear normal. Patent carotid termini. Normal MCA and ACA origins. Anterior communicating artery and visualized bilateral ACA branches are within normal limits. The left MCA bifurcation is a ectatic without discrete  saccular aneurysm. Otherwise the visualized bilateral MCA branches are within normal limits. IMPRESSION: 1. Intermittently motion degraded despite repeated imaging attempts. 2.  No acute infarct or acute intracranial abnormality identified. 3. Chronic distal left vertebral artery occlusion. Otherwise negative for age intracranial MRA. Electronically Signed   By: Genevie Ann M.D.   On: 07/31/2015 19:24    Microbiology: Recent Results (from the past 240 hour(s))  Urine culture     Status: None   Collection Time: 07/31/15  3:15 PM  Result Value Ref Range Status   Specimen Description URINE, RANDOM  Final   Special Requests NONE  Final   Culture >=100,000 COLONIES/mL ENTEROBACTER AEROGENES  Final   Report Status 08/02/2015 FINAL  Final   Organism ID, Bacteria ENTEROBACTER AEROGENES  Final      Susceptibility   Enterobacter aerogenes - MIC*    CEFAZOLIN >=64 RESISTANT Resistant     CEFTRIAXONE 16 INTERMEDIATE Intermediate     CIPROFLOXACIN <=0.25 SENSITIVE Sensitive     GENTAMICIN <=1 SENSITIVE Sensitive     IMIPENEM 1 SENSITIVE Sensitive     NITROFURANTOIN <=16 SENSITIVE Sensitive     TRIMETH/SULFA <=20 SENSITIVE Sensitive     PIP/TAZO >=128 RESISTANT Resistant     * >=100,000 COLONIES/mL ENTEROBACTER AEROGENES  Blood culture (routine x 2)     Status: None   Collection Time: 07/31/15  3:50 PM  Result Value Ref Range Status   Specimen Description BLOOD RIGHT ARM  Final   Special Requests BOTTLES DRAWN AEROBIC AND ANAEROBIC 5CC  Final   Culture NO GROWTH 5 DAYS  Final   Report Status 08/05/2015 FINAL  Final  Blood culture (routine x 2)     Status: None   Collection Time: 07/31/15  3:55 PM  Result Value Ref Range Status   Specimen Description BLOOD RIGHT HAND  Final   Special Requests BOTTLES DRAWN AEROBIC ONLY [place]  Final   Culture NO GROWTH 5 DAYS  Final   Report Status 08/05/2015 FINAL  Final  MRSA PCR Screening     Status: None   Collection Time: 07/31/15  8:52 PM  Result Value Ref  Range Status   MRSA by PCR NEGATIVE NEGATIVE Final    Comment:        The GeneXpert MRSA Assay (FDA approved for NASAL specimens only), is one component of a comprehensive MRSA colonization surveillance program. It is not intended to diagnose MRSA infection nor to guide or monitor treatment for MRSA infections.      Labs: Basic Metabolic Panel:  Recent Labs Lab 08/01/15 0507 08/02/15 0600 08/03/15 0343  08/04/15 0930 08/05/15 0944  NA 139 141 140 138 137  K 3.9 3.7 3.8 3.7 4.7  CL 105 107 107 109 109  CO2 27 25 23  21* 19*  GLUCOSE 83 136* 217* 163* 130*  BUN 22* 25* 30* 33* 43*  CREATININE 1.20* 1.14* 1.15* 1.18* 1.23*  CALCIUM 9.4 9.0 9.1 9.4 9.1  MG  --   --   --  2.0 1.8   Liver Function Tests:  Recent Labs Lab 08/02/15 0600 08/03/15 0343 08/04/15 0930 08/05/15 0944  AST 99* 79* 88* 65*  ALT 81* 82* 94* 80*  ALKPHOS 156* 143* 118 98  BILITOT 0.5 0.7 0.5 0.9  PROT 5.2* 5.3* 5.1* 4.6*  ALBUMIN 2.4* 2.4* 2.6* 2.5*   No results for input(s): LIPASE, AMYLASE in the last 168 hours.  Recent Labs Lab 07/31/15 1537  AMMONIA 11   CBC:  Recent Labs Lab 08/02/15 0600 08/03/15 0343 08/04/15 0449 08/05/15 0410 08/06/15 0435  WBC 4.8 3.7* 4.1 4.0 5.5  HGB 10.3* 10.2* 9.6* 9.3* 9.6*  HCT 32.7* 32.6* 31.3* 30.2* 30.6*  MCV 87.7 88.3 88.2 87.8 87.9  PLT 185 194 224 217 210   Cardiac Enzymes: No results for input(s): CKTOTAL, CKMB, CKMBINDEX, TROPONINI in the last 168 hours. BNP: BNP (last 3 results) No results for input(s): BNP in the last 8760 hours.  ProBNP (last 3 results) No results for input(s): PROBNP in the last 8760 hours.  CBG:  Recent Labs Lab 08/06/15 0427 08/06/15 0803 08/06/15 1244 08/06/15 1507 08/06/15 1940  GLUCAP 146* 97 118* 150* 176*       Signed:  Dia Crawford, MD [facility] [phone] pager

## 2015-08-08 ENCOUNTER — Other Ambulatory Visit: Payer: Self-pay | Admitting: *Deleted

## 2015-08-09 ENCOUNTER — Encounter: Payer: Self-pay | Admitting: Licensed Clinical Social Worker

## 2015-08-09 ENCOUNTER — Other Ambulatory Visit: Payer: Self-pay | Admitting: Licensed Clinical Social Worker

## 2015-08-09 NOTE — Patient Outreach (Signed)
[facility]) Care Management  08/09/2015  CHEMIKA VESELY 1920/09/20 [id number]   Assessment-CSW will complete case closure at this time as it has been confirmed that patient is now at [facility]. RNCM is aware.  Plan-CSW will inform Care Management Assistant Lurline Del of case closure. CSW will send case closure letter to PCP.  Eula Fried, BSW, MSW, [email or web address] Phone: [phone] Fax: [phone]

## 2015-08-10 ENCOUNTER — Other Ambulatory Visit: Payer: Self-pay | Admitting: *Deleted

## 2015-08-21 ENCOUNTER — Ambulatory Visit: Payer: Medicare Other | Admitting: Internal Medicine

## 2015-09-02 DEATH — deceased

## 2015-09-24 ENCOUNTER — Ambulatory Visit: Payer: Self-pay | Admitting: General Practice

## 2017-06-21 IMAGING — DX DG CHEST 2V
2 series · 2 of 2 positions shown · non-contrast
Comparison: September 04, 2014

CLINICAL DATA: Altered mental status

EXAM:
CHEST  2 VIEW

[x chest ap]
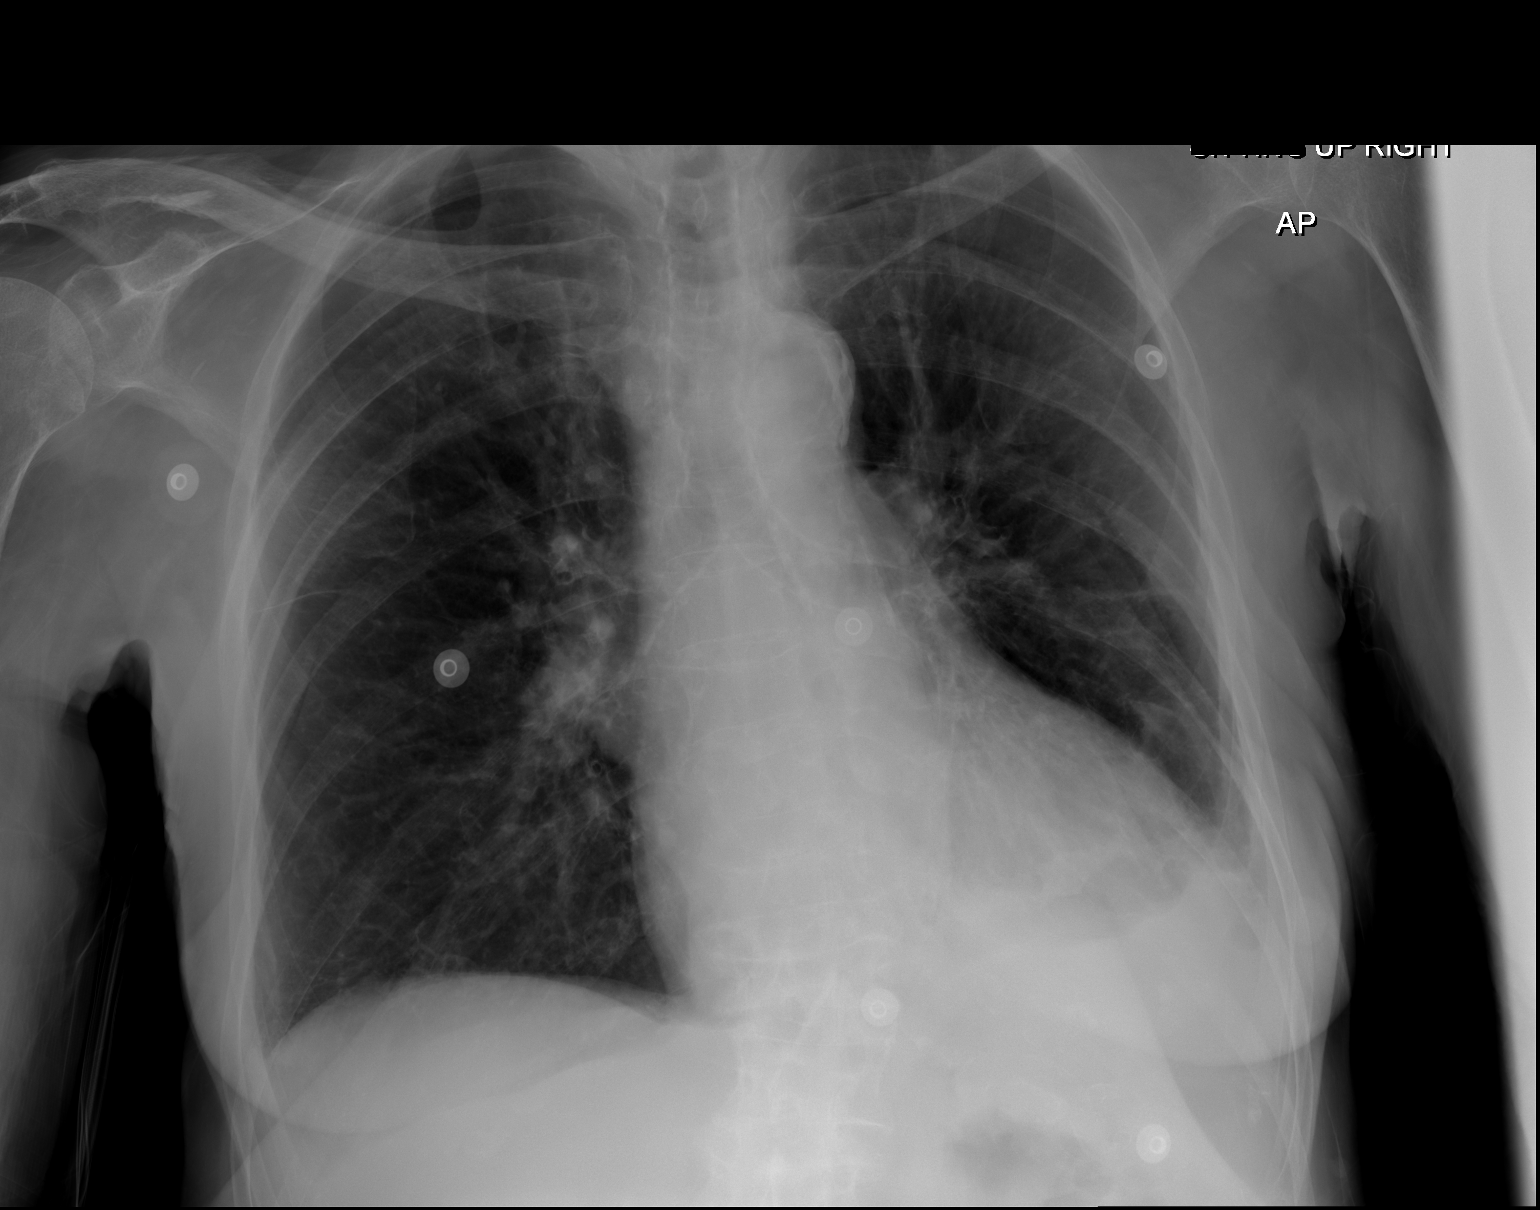

[w chest lat]
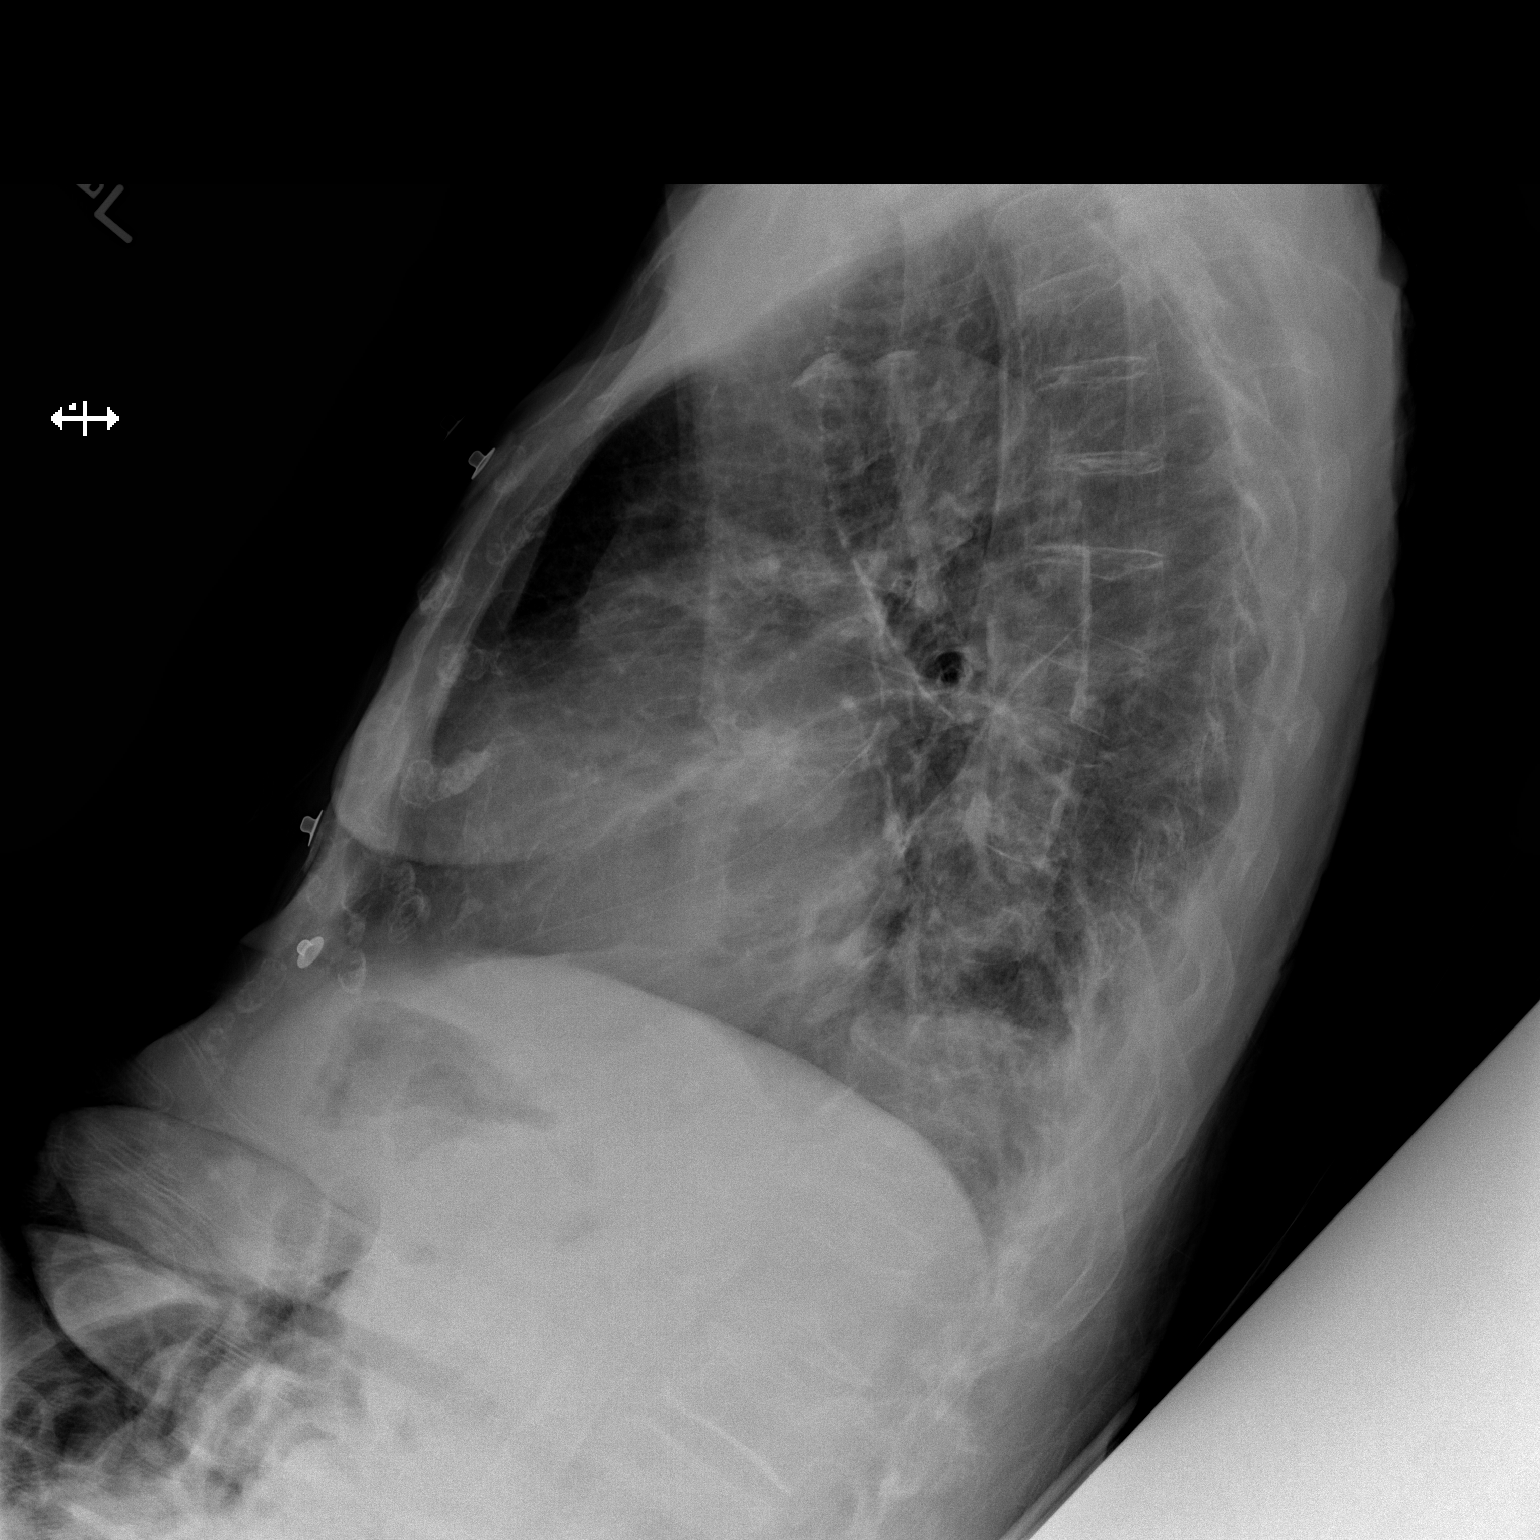

[2 of 2 positions shown; findings below may reference images not displayed]

FINDINGS: There is patchy airspace consolidation in the left base with minimal
left effusion. Lungs elsewhere clear. Heart size and pulmonary
vascularity are normal. No adenopathy. There is atherosclerotic
change in aorta. There is anterior wedging of several thoracic
vertebral bodies.
IMPRESSION: Left base airspace consolidation. Followup PA and lateral chest
radiographs recommended in 3-4 weeks following trial of antibiotic
therapy to ensure resolution and exclude underlying malignancy.
